# Patient Record
Sex: Female | Born: 1940 | Race: White | Hispanic: No | Smoking: Former smoker
Health system: Southern US, Community
[De-identification: ages and names within clinical notes are randomized; demographics above are authoritative.]

## PROBLEM LIST (undated history)

## (undated) DIAGNOSIS — R059 Cough, unspecified: Secondary | ICD-10-CM

## (undated) DIAGNOSIS — K219 Gastro-esophageal reflux disease without esophagitis: Secondary | ICD-10-CM

## (undated) DIAGNOSIS — N390 Urinary tract infection, site not specified: Secondary | ICD-10-CM

## (undated) DIAGNOSIS — Z0000 Encounter for general adult medical examination without abnormal findings: Secondary | ICD-10-CM

## (undated) DIAGNOSIS — T7840XA Allergy, unspecified, initial encounter: Secondary | ICD-10-CM

## (undated) DIAGNOSIS — I10 Essential (primary) hypertension: Secondary | ICD-10-CM

## (undated) DIAGNOSIS — Z9889 Other specified postprocedural states: Secondary | ICD-10-CM

## (undated) DIAGNOSIS — I872 Venous insufficiency (chronic) (peripheral): Secondary | ICD-10-CM

## (undated) DIAGNOSIS — R112 Nausea with vomiting, unspecified: Secondary | ICD-10-CM

## (undated) DIAGNOSIS — M1990 Unspecified osteoarthritis, unspecified site: Secondary | ICD-10-CM

## (undated) DIAGNOSIS — R739 Hyperglycemia, unspecified: Secondary | ICD-10-CM

## (undated) DIAGNOSIS — R32 Unspecified urinary incontinence: Secondary | ICD-10-CM

## (undated) DIAGNOSIS — J309 Allergic rhinitis, unspecified: Secondary | ICD-10-CM

## (undated) DIAGNOSIS — Z8619 Personal history of other infectious and parasitic diseases: Secondary | ICD-10-CM

## (undated) DIAGNOSIS — F341 Dysthymic disorder: Secondary | ICD-10-CM

## (undated) DIAGNOSIS — K5730 Diverticulosis of large intestine without perforation or abscess without bleeding: Secondary | ICD-10-CM

## (undated) DIAGNOSIS — G629 Polyneuropathy, unspecified: Secondary | ICD-10-CM

## (undated) DIAGNOSIS — G4730 Sleep apnea, unspecified: Secondary | ICD-10-CM

## (undated) DIAGNOSIS — M545 Low back pain, unspecified: Secondary | ICD-10-CM

## (undated) DIAGNOSIS — M79609 Pain in unspecified limb: Secondary | ICD-10-CM

## (undated) DIAGNOSIS — R05 Cough: Secondary | ICD-10-CM

## (undated) DIAGNOSIS — R7309 Other abnormal glucose: Secondary | ICD-10-CM

## (undated) DIAGNOSIS — F418 Other specified anxiety disorders: Secondary | ICD-10-CM

## (undated) DIAGNOSIS — J439 Emphysema, unspecified: Secondary | ICD-10-CM

## (undated) DIAGNOSIS — D649 Anemia, unspecified: Secondary | ICD-10-CM

## (undated) DIAGNOSIS — T7800XA Anaphylactic reaction due to unspecified food, initial encounter: Secondary | ICD-10-CM

## (undated) DIAGNOSIS — J849 Interstitial pulmonary disease, unspecified: Secondary | ICD-10-CM

## (undated) DIAGNOSIS — E669 Obesity, unspecified: Secondary | ICD-10-CM

## (undated) DIAGNOSIS — N39498 Other specified urinary incontinence: Secondary | ICD-10-CM

## (undated) DIAGNOSIS — R413 Other amnesia: Secondary | ICD-10-CM

## (undated) DIAGNOSIS — R350 Frequency of micturition: Secondary | ICD-10-CM

## (undated) DIAGNOSIS — E039 Hypothyroidism, unspecified: Secondary | ICD-10-CM

## (undated) DIAGNOSIS — E559 Vitamin D deficiency, unspecified: Secondary | ICD-10-CM

## (undated) DIAGNOSIS — J449 Chronic obstructive pulmonary disease, unspecified: Secondary | ICD-10-CM

## (undated) DIAGNOSIS — J40 Bronchitis, not specified as acute or chronic: Secondary | ICD-10-CM

## (undated) DIAGNOSIS — N952 Postmenopausal atrophic vaginitis: Secondary | ICD-10-CM

## (undated) DIAGNOSIS — G4733 Obstructive sleep apnea (adult) (pediatric): Secondary | ICD-10-CM

## (undated) DIAGNOSIS — J42 Unspecified chronic bronchitis: Secondary | ICD-10-CM

## (undated) HISTORY — DX: Allergy, unspecified, initial encounter: T78.40XA

## (undated) HISTORY — PX: ABDOMINAL HYSTERECTOMY: SHX81

## (undated) HISTORY — DX: Unspecified osteoarthritis, unspecified site: M19.90

## (undated) HISTORY — DX: Other abnormal glucose: R73.09

## (undated) HISTORY — DX: Sleep apnea, unspecified: G47.30

## (undated) HISTORY — DX: Encounter for general adult medical examination without abnormal findings: Z00.00

## (undated) HISTORY — DX: Other amnesia: R41.3

## (undated) HISTORY — DX: Hypothyroidism, unspecified: E03.9

## (undated) HISTORY — DX: Essential (primary) hypertension: I10

## (undated) HISTORY — DX: Other specified anxiety disorders: F41.8

## (undated) HISTORY — DX: Obesity, unspecified: E66.9

## (undated) HISTORY — PX: CHOLECYSTECTOMY: SHX55

## (undated) HISTORY — DX: Low back pain: M54.5

## (undated) HISTORY — DX: Diverticulosis of large intestine without perforation or abscess without bleeding: K57.30

## (undated) HISTORY — PX: TUBAL LIGATION: SHX77

## (undated) HISTORY — DX: Other specified urinary incontinence: N39.498

## (undated) HISTORY — DX: Gastro-esophageal reflux disease without esophagitis: K21.9

## (undated) HISTORY — PX: APPENDECTOMY: SHX54

## (undated) HISTORY — DX: Chronic obstructive pulmonary disease, unspecified: J44.9

## (undated) HISTORY — DX: Personal history of other infectious and parasitic diseases: Z86.19

## (undated) HISTORY — DX: Unspecified chronic bronchitis: J42

## (undated) HISTORY — DX: Interstitial pulmonary disease, unspecified: J84.9

## (undated) HISTORY — DX: Postmenopausal atrophic vaginitis: N95.2

## (undated) HISTORY — DX: Dysthymic disorder: F34.1

## (undated) HISTORY — DX: Obstructive sleep apnea (adult) (pediatric): G47.33

## (undated) HISTORY — PX: OTHER SURGICAL HISTORY: SHX169

## (undated) HISTORY — DX: Anaphylactic reaction due to unspecified food, initial encounter: T78.00XA

## (undated) HISTORY — PX: SHOULDER ARTHROSCOPY DISTAL CLAVICLE EXCISION AND OPEN ROTATOR CUFF REPAIR: SHX2396

## (undated) HISTORY — DX: Vitamin D deficiency, unspecified: E55.9

## (undated) HISTORY — PX: EYE SURGERY: SHX253

## (undated) HISTORY — DX: Low back pain, unspecified: M54.50

## (undated) HISTORY — DX: Anemia, unspecified: D64.9

## (undated) HISTORY — PX: TONSILLECTOMY AND ADENOIDECTOMY: SHX28

## (undated) HISTORY — DX: Pain in unspecified limb: M79.609

## (undated) HISTORY — DX: Emphysema, unspecified: J43.9

## (undated) HISTORY — DX: Hyperglycemia, unspecified: R73.9

## (undated) HISTORY — DX: Polyneuropathy, unspecified: G62.9

## (undated) HISTORY — DX: Allergic rhinitis, unspecified: J30.9

## (undated) HISTORY — DX: Venous insufficiency (chronic) (peripheral): I87.2

## (undated) HISTORY — DX: Bronchitis, not specified as acute or chronic: J40

## (undated) HISTORY — PX: NECK SURGERY: SHX720

## (undated) HISTORY — PX: SPINE SURGERY: SHX786

## (undated) HISTORY — DX: Urinary tract infection, site not specified: N39.0

---

## 1998-04-16 ENCOUNTER — Encounter: Admission: RE | Admit: 1998-04-16 | Discharge: 1998-07-15 | Payer: Self-pay | Admitting: *Deleted

## 1998-08-12 ENCOUNTER — Ambulatory Visit (HOSPITAL_COMMUNITY): Admission: RE | Admit: 1998-08-12 | Discharge: 1998-08-12 | Payer: Self-pay | Admitting: Specialist

## 1998-08-13 ENCOUNTER — Encounter: Payer: Self-pay | Admitting: Specialist

## 1998-09-02 ENCOUNTER — Observation Stay (HOSPITAL_COMMUNITY): Admission: RE | Admit: 1998-09-02 | Discharge: 1998-09-03 | Payer: Self-pay | Admitting: Specialist

## 1998-10-21 ENCOUNTER — Encounter: Admission: RE | Admit: 1998-10-21 | Discharge: 1998-12-06 | Payer: Self-pay | Admitting: Specialist

## 1999-06-09 ENCOUNTER — Encounter: Payer: Self-pay | Admitting: Emergency Medicine

## 1999-06-09 ENCOUNTER — Emergency Department (HOSPITAL_COMMUNITY): Admission: EM | Admit: 1999-06-09 | Discharge: 1999-06-09 | Payer: Self-pay | Admitting: Emergency Medicine

## 2003-05-13 HISTORY — PX: OTHER SURGICAL HISTORY: SHX169

## 2003-06-06 ENCOUNTER — Encounter: Payer: Self-pay | Admitting: Urology

## 2003-06-11 ENCOUNTER — Observation Stay (HOSPITAL_COMMUNITY): Admission: RE | Admit: 2003-06-11 | Discharge: 2003-06-12 | Payer: Self-pay | Admitting: Urology

## 2004-01-18 ENCOUNTER — Ambulatory Visit (HOSPITAL_COMMUNITY): Admission: RE | Admit: 2004-01-18 | Discharge: 2004-01-18 | Payer: Self-pay | Admitting: Pulmonary Disease

## 2004-11-21 ENCOUNTER — Encounter: Admission: RE | Admit: 2004-11-21 | Discharge: 2004-11-21 | Payer: Self-pay | Admitting: Orthopedic Surgery

## 2004-12-03 ENCOUNTER — Encounter: Admission: RE | Admit: 2004-12-03 | Discharge: 2004-12-03 | Payer: Self-pay | Admitting: Orthopedic Surgery

## 2004-12-10 DIAGNOSIS — R413 Other amnesia: Secondary | ICD-10-CM

## 2004-12-10 HISTORY — DX: Other amnesia: R41.3

## 2004-12-17 ENCOUNTER — Encounter: Admission: RE | Admit: 2004-12-17 | Discharge: 2004-12-17 | Payer: Self-pay | Admitting: Orthopedic Surgery

## 2005-02-25 ENCOUNTER — Ambulatory Visit: Payer: Self-pay | Admitting: Pulmonary Disease

## 2005-03-12 ENCOUNTER — Ambulatory Visit: Payer: Self-pay | Admitting: Pulmonary Disease

## 2005-04-13 ENCOUNTER — Emergency Department (HOSPITAL_COMMUNITY): Admission: EM | Admit: 2005-04-13 | Discharge: 2005-04-13 | Payer: Self-pay | Admitting: Emergency Medicine

## 2005-04-22 ENCOUNTER — Ambulatory Visit: Payer: Self-pay | Admitting: Pulmonary Disease

## 2005-07-02 ENCOUNTER — Other Ambulatory Visit: Admission: RE | Admit: 2005-07-02 | Discharge: 2005-07-02 | Payer: Self-pay | Admitting: Obstetrics and Gynecology

## 2005-07-15 ENCOUNTER — Ambulatory Visit (HOSPITAL_COMMUNITY): Admission: RE | Admit: 2005-07-15 | Discharge: 2005-07-15 | Payer: Self-pay | Admitting: Gastroenterology

## 2005-10-29 ENCOUNTER — Encounter: Admission: RE | Admit: 2005-10-29 | Discharge: 2005-10-29 | Payer: Self-pay | Admitting: Orthopedic Surgery

## 2005-11-30 ENCOUNTER — Inpatient Hospital Stay (HOSPITAL_COMMUNITY): Admission: EM | Admit: 2005-11-30 | Discharge: 2005-12-03 | Payer: Self-pay | Admitting: Emergency Medicine

## 2005-11-30 ENCOUNTER — Ambulatory Visit: Payer: Self-pay | Admitting: Pulmonary Disease

## 2006-06-21 ENCOUNTER — Ambulatory Visit: Payer: Self-pay | Admitting: Pulmonary Disease

## 2006-06-25 ENCOUNTER — Encounter: Admission: RE | Admit: 2006-06-25 | Discharge: 2006-06-25 | Payer: Self-pay | Admitting: Orthopedic Surgery

## 2006-08-18 ENCOUNTER — Encounter: Admission: RE | Admit: 2006-08-18 | Discharge: 2006-08-18 | Payer: Self-pay | Admitting: Orthopedic Surgery

## 2006-09-10 ENCOUNTER — Encounter: Admission: RE | Admit: 2006-09-10 | Discharge: 2006-09-10 | Payer: Self-pay | Admitting: Orthopedic Surgery

## 2006-10-12 HISTORY — PX: OTHER SURGICAL HISTORY: SHX169

## 2006-12-20 ENCOUNTER — Ambulatory Visit: Payer: Self-pay | Admitting: Pulmonary Disease

## 2006-12-20 LAB — CONVERTED CEMR LAB
ALT: 22 units/L (ref 0–40)
AST: 18 units/L (ref 0–37)
Albumin: 3.4 g/dL — ABNORMAL LOW (ref 3.5–5.2)
Alkaline Phosphatase: 82 units/L (ref 39–117)
BUN: 10 mg/dL (ref 6–23)
Basophils Absolute: 0.1 10*3/uL (ref 0.0–0.1)
Basophils Relative: 0.8 % (ref 0.0–1.0)
Bilirubin, Direct: 0.2 mg/dL (ref 0.0–0.3)
CO2: 35 meq/L — ABNORMAL HIGH (ref 19–32)
Calcium: 9.1 mg/dL (ref 8.4–10.5)
Chloride: 104 meq/L (ref 96–112)
Cholesterol: 166 mg/dL (ref 0–200)
Creatinine, Ser: 1 mg/dL (ref 0.4–1.2)
Eosinophils Absolute: 0.2 10*3/uL (ref 0.0–0.6)
Eosinophils Relative: 1.9 % (ref 0.0–5.0)
GFR calc Af Amer: 71 mL/min
GFR calc non Af Amer: 59 mL/min
Glucose, Bld: 104 mg/dL — ABNORMAL HIGH (ref 70–99)
HCT: 45.1 % (ref 36.0–46.0)
HDL: 53.7 mg/dL (ref >39.0)
Hemoglobin: 15.1 g/dL — ABNORMAL HIGH (ref 12.0–15.0)
LDL Cholesterol: 81 mg/dL (ref 0–99)
Lymphocytes Relative: 20.3 % (ref 12.0–46.0)
MCHC: 33.6 g/dL (ref 30.0–36.0)
MCV: 92.9 fL (ref 78.0–100.0)
Monocytes Absolute: 1 10*3/uL — ABNORMAL HIGH (ref 0.2–0.7)
Monocytes Relative: 9.4 % (ref 3.0–11.0)
Neutro Abs: 7.2 10*3/uL (ref 1.4–7.7)
Neutrophils Relative %: 67.6 % (ref 43.0–77.0)
Platelets: 290 10*3/uL (ref 150–400)
Potassium: 4.8 meq/L (ref 3.5–5.1)
RBC: 4.85 M/uL (ref 3.87–5.11)
RDW: 13 % (ref 11.5–14.6)
Sodium: 145 meq/L (ref 135–145)
TSH: 49.55 microintl units/mL — ABNORMAL HIGH (ref 0.35–5.50)
Total Bilirubin: 0.6 mg/dL (ref 0.3–1.2)
Total CHOL/HDL Ratio: 3.1
Total Protein: 6.6 g/dL (ref 6.0–8.3)
Triglycerides: 156 mg/dL — ABNORMAL HIGH (ref 0–149)
VLDL: 31 mg/dL (ref 0–40)
WBC: 10.7 10*3/uL — ABNORMAL HIGH (ref 4.5–10.5)

## 2007-01-13 ENCOUNTER — Ambulatory Visit (HOSPITAL_BASED_OUTPATIENT_CLINIC_OR_DEPARTMENT_OTHER): Admission: RE | Admit: 2007-01-13 | Discharge: 2007-01-13 | Payer: Self-pay | Admitting: Pulmonary Disease

## 2007-01-21 ENCOUNTER — Ambulatory Visit (HOSPITAL_COMMUNITY): Admission: RE | Admit: 2007-01-21 | Discharge: 2007-01-21 | Payer: Self-pay | Admitting: Obstetrics and Gynecology

## 2007-01-21 ENCOUNTER — Ambulatory Visit: Payer: Self-pay | Admitting: Vascular Surgery

## 2007-01-21 ENCOUNTER — Encounter (INDEPENDENT_AMBULATORY_CARE_PROVIDER_SITE_OTHER): Payer: Self-pay | Admitting: *Deleted

## 2007-01-25 ENCOUNTER — Ambulatory Visit: Payer: Self-pay | Admitting: Pulmonary Disease

## 2007-01-25 LAB — CONVERTED CEMR LAB
BUN: 12 mg/dL (ref 6–23)
CO2: 32 meq/L (ref 19–32)
Calcium: 8.9 mg/dL (ref 8.4–10.5)
Chloride: 106 meq/L (ref 96–112)
Creatinine, Ser: 0.8 mg/dL (ref 0.4–1.2)
GFR calc Af Amer: 92 mL/min
GFR calc non Af Amer: 76 mL/min
Glucose, Bld: 159 mg/dL — ABNORMAL HIGH (ref 70–99)
Potassium: 3.5 meq/L (ref 3.5–5.1)
Pro B Natriuretic peptide (BNP): 61 pg/mL (ref 0.0–100.0)
Sodium: 144 meq/L (ref 135–145)
TSH: 0.79 u[IU]/mL (ref 0.35–5.50)

## 2007-01-28 ENCOUNTER — Ambulatory Visit: Payer: Self-pay | Admitting: Pulmonary Disease

## 2007-02-01 ENCOUNTER — Ambulatory Visit: Payer: Self-pay | Admitting: Pulmonary Disease

## 2007-03-04 ENCOUNTER — Ambulatory Visit: Payer: Self-pay | Admitting: Pulmonary Disease

## 2007-03-28 ENCOUNTER — Ambulatory Visit: Payer: Self-pay | Admitting: Cardiology

## 2007-04-13 ENCOUNTER — Encounter: Payer: Self-pay | Admitting: Cardiology

## 2007-04-13 ENCOUNTER — Ambulatory Visit: Payer: Self-pay

## 2007-05-10 ENCOUNTER — Ambulatory Visit: Payer: Self-pay | Admitting: Pulmonary Disease

## 2007-08-23 ENCOUNTER — Ambulatory Visit: Payer: Self-pay | Admitting: Pulmonary Disease

## 2007-08-23 DIAGNOSIS — J309 Allergic rhinitis, unspecified: Secondary | ICD-10-CM | POA: Insufficient documentation

## 2007-08-23 DIAGNOSIS — M545 Low back pain, unspecified: Secondary | ICD-10-CM | POA: Insufficient documentation

## 2007-08-23 DIAGNOSIS — G4733 Obstructive sleep apnea (adult) (pediatric): Secondary | ICD-10-CM

## 2007-08-23 DIAGNOSIS — I872 Venous insufficiency (chronic) (peripheral): Secondary | ICD-10-CM

## 2007-08-23 DIAGNOSIS — E669 Obesity, unspecified: Secondary | ICD-10-CM

## 2007-08-23 DIAGNOSIS — K219 Gastro-esophageal reflux disease without esophagitis: Secondary | ICD-10-CM

## 2007-08-23 DIAGNOSIS — E039 Hypothyroidism, unspecified: Secondary | ICD-10-CM | POA: Insufficient documentation

## 2007-08-23 HISTORY — DX: Hypothyroidism, unspecified: E03.9

## 2007-08-23 HISTORY — DX: Venous insufficiency (chronic) (peripheral): I87.2

## 2007-08-23 HISTORY — DX: Allergic rhinitis, unspecified: J30.9

## 2007-08-23 HISTORY — DX: Low back pain, unspecified: M54.50

## 2007-08-23 HISTORY — DX: Obesity, unspecified: E66.9

## 2007-08-23 HISTORY — DX: Obstructive sleep apnea (adult) (pediatric): G47.33

## 2007-08-23 HISTORY — DX: Gastro-esophageal reflux disease without esophagitis: K21.9

## 2008-02-14 ENCOUNTER — Encounter: Payer: Self-pay | Admitting: Pulmonary Disease

## 2008-02-18 ENCOUNTER — Encounter: Payer: Self-pay | Admitting: Pulmonary Disease

## 2008-02-22 ENCOUNTER — Ambulatory Visit: Payer: Self-pay | Admitting: Pulmonary Disease

## 2008-02-22 DIAGNOSIS — M1712 Unilateral primary osteoarthritis, left knee: Secondary | ICD-10-CM | POA: Insufficient documentation

## 2008-02-22 DIAGNOSIS — J42 Unspecified chronic bronchitis: Secondary | ICD-10-CM

## 2008-02-22 DIAGNOSIS — I10 Essential (primary) hypertension: Secondary | ICD-10-CM | POA: Insufficient documentation

## 2008-02-22 DIAGNOSIS — K5730 Diverticulosis of large intestine without perforation or abscess without bleeding: Secondary | ICD-10-CM

## 2008-02-22 DIAGNOSIS — M1990 Unspecified osteoarthritis, unspecified site: Secondary | ICD-10-CM

## 2008-02-22 DIAGNOSIS — R079 Chest pain, unspecified: Secondary | ICD-10-CM | POA: Insufficient documentation

## 2008-02-22 HISTORY — DX: Unspecified osteoarthritis, unspecified site: M19.90

## 2008-02-22 HISTORY — DX: Essential (primary) hypertension: I10

## 2008-02-22 HISTORY — DX: Diverticulosis of large intestine without perforation or abscess without bleeding: K57.30

## 2008-02-22 HISTORY — DX: Unspecified chronic bronchitis: J42

## 2008-02-26 LAB — CONVERTED CEMR LAB
ALT: 14 U/L (ref 0–35)
AST: 17 U/L (ref 0–37)
Albumin: 3.8 g/dL (ref 3.5–5.2)
Alkaline Phosphatase: 114 units/L (ref 39–117)
BUN: 17 mg/dL (ref 6–23)
Basophils Absolute: 0 10*3/uL (ref 0.0–0.1)
Basophils Relative: 0.1 % (ref 0.0–1.0)
Bilirubin, Direct: 0.2 mg/dL (ref 0.0–0.3)
CO2: 32 meq/L (ref 19–32)
Calcium: 9.3 mg/dL (ref 8.4–10.5)
Chloride: 104 meq/L (ref 96–112)
Cholesterol: 137 mg/dL (ref 0–200)
Creatinine, Ser: 0.9 mg/dL (ref 0.4–1.2)
Eosinophils Absolute: 0.4 10*3/uL (ref 0.0–0.7)
Eosinophils Relative: 4.3 % (ref 0.0–5.0)
GFR calc Af Amer: 80 mL/min
GFR calc non Af Amer: 66 mL/min
Glucose, Bld: 129 mg/dL — ABNORMAL HIGH (ref 70–99)
HCT: 43.8 % (ref 36.0–46.0)
HDL: 39.3 mg/dL (ref >39.0)
Hemoglobin: 14.7 g/dL (ref 12.0–15.0)
Hgb A1c MFr Bld: 6.4 % — ABNORMAL HIGH (ref 4.6–6.0)
LDL Cholesterol: 78 mg/dL (ref 0–99)
Lymphocytes Relative: 23.8 % (ref 12.0–46.0)
MCHC: 33.6 g/dL (ref 30.0–36.0)
MCV: 90.2 fL (ref 78.0–100.0)
Monocytes Absolute: 0.8 10*3/uL (ref 0.1–1.0)
Monocytes Relative: 7.6 % (ref 3.0–12.0)
Neutro Abs: 6.3 10*3/uL (ref 1.4–7.7)
Neutrophils Relative %: 64.2 % (ref 43.0–77.0)
Platelets: 311 10*3/uL (ref 150–400)
Potassium: 4.9 meq/L (ref 3.5–5.1)
RBC: 4.86 M/uL (ref 3.87–5.11)
RDW: 12.1 % (ref 11.5–14.6)
Sodium: 142 meq/L (ref 135–145)
TSH: 8.17 u[IU]/mL — ABNORMAL HIGH (ref 0.35–5.50)
Total Bilirubin: 0.9 mg/dL (ref 0.3–1.2)
Total CHOL/HDL Ratio: 3.5
Total Protein: 6.7 g/dL (ref 6.0–8.3)
Triglycerides: 101 mg/dL (ref 0–149)
VLDL: 20 mg/dL (ref 0–40)
WBC: 9.9 10*3/uL (ref 4.5–10.5)

## 2008-03-06 DIAGNOSIS — F341 Dysthymic disorder: Secondary | ICD-10-CM

## 2008-03-06 DIAGNOSIS — F418 Other specified anxiety disorders: Secondary | ICD-10-CM | POA: Insufficient documentation

## 2008-03-06 HISTORY — DX: Other specified anxiety disorders: F41.8

## 2008-03-06 HISTORY — DX: Dysthymic disorder: F34.1

## 2008-03-06 LAB — CONVERTED CEMR LAB: Vit D, 1,25-Dihydroxy: 16 — ABNORMAL LOW (ref 30–89)

## 2008-08-21 ENCOUNTER — Ambulatory Visit: Payer: Self-pay | Admitting: Pulmonary Disease

## 2008-08-21 DIAGNOSIS — E669 Obesity, unspecified: Secondary | ICD-10-CM | POA: Insufficient documentation

## 2008-08-21 DIAGNOSIS — E1169 Type 2 diabetes mellitus with other specified complication: Secondary | ICD-10-CM | POA: Insufficient documentation

## 2008-08-21 DIAGNOSIS — E559 Vitamin D deficiency, unspecified: Secondary | ICD-10-CM | POA: Insufficient documentation

## 2008-08-21 DIAGNOSIS — R7309 Other abnormal glucose: Secondary | ICD-10-CM

## 2008-08-21 HISTORY — DX: Other abnormal glucose: R73.09

## 2008-08-21 HISTORY — DX: Vitamin D deficiency, unspecified: E55.9

## 2008-08-26 LAB — CONVERTED CEMR LAB
BUN: 16 mg/dL (ref 6–23)
CO2: 30 meq/L (ref 19–32)
Calcium: 9.3 mg/dL (ref 8.4–10.5)
Chloride: 104 meq/L (ref 96–112)
Creatinine, Ser: 0.9 mg/dL (ref 0.4–1.2)
GFR calc Af Amer: 80 mL/min
GFR calc non Af Amer: 66 mL/min
Glucose, Bld: 118 mg/dL — ABNORMAL HIGH (ref 70–99)
Hgb A1c MFr Bld: 6.8 % — ABNORMAL HIGH (ref 4.6–6.0)
Potassium: 5.1 meq/L (ref 3.5–5.1)
Sodium: 140 meq/L (ref 135–145)
TSH: 18.13 microintl units/mL — ABNORMAL HIGH (ref 0.35–5.50)
Vit D, 1,25-Dihydroxy: 30 (ref 30–89)

## 2008-10-12 HISTORY — PX: OTHER SURGICAL HISTORY: SHX169

## 2009-03-19 ENCOUNTER — Encounter: Payer: Self-pay | Admitting: Adult Health

## 2009-03-19 ENCOUNTER — Ambulatory Visit: Payer: Self-pay | Admitting: Internal Medicine

## 2009-03-21 LAB — CONVERTED CEMR LAB: Vit D, 25-Hydroxy: 20 ng/mL — ABNORMAL LOW (ref 30–89)

## 2009-03-29 LAB — CONVERTED CEMR LAB
BUN: 15 mg/dL (ref 6–23)
Basophils Absolute: 0 10*3/uL (ref 0.0–0.1)
Basophils Relative: 0 % (ref 0.0–3.0)
CO2: 30 meq/L (ref 19–32)
Calcium: 9.3 mg/dL (ref 8.4–10.5)
Chloride: 111 meq/L (ref 96–112)
Cholesterol: 146 mg/dL (ref 0–200)
Creatinine, Ser: 0.8 mg/dL (ref 0.4–1.2)
Eosinophils Absolute: 0.4 10*3/uL (ref 0.0–0.7)
Eosinophils Relative: 5.3 % — ABNORMAL HIGH (ref 0.0–5.0)
GFR calc non Af Amer: 75.3 mL/min (ref >60)
Glucose, Bld: 100 mg/dL — ABNORMAL HIGH (ref 70–99)
HCT: 41.3 % (ref 36.0–46.0)
HDL: 39.1 mg/dL (ref >39.00)
Hemoglobin: 14.4 g/dL (ref 12.0–15.0)
Hgb A1c MFr Bld: 6 % (ref 4.6–6.5)
LDL Cholesterol: 82 mg/dL (ref 0–99)
Lymphocytes Relative: 29.8 % (ref 12.0–46.0)
Lymphs Abs: 2.3 10*3/uL (ref 0.7–4.0)
MCHC: 35 g/dL (ref 30.0–36.0)
MCV: 90.7 fL (ref 78.0–100.0)
Monocytes Absolute: 0.7 10*3/uL (ref 0.1–1.0)
Monocytes Relative: 8.4 % (ref 3.0–12.0)
Neutro Abs: 4.4 10*3/uL (ref 1.4–7.7)
Neutrophils Relative %: 56.5 % (ref 43.0–77.0)
Platelets: 224 10*3/uL (ref 150.0–400.0)
Potassium: 4.1 meq/L (ref 3.5–5.1)
RBC: 4.55 M/uL (ref 3.87–5.11)
RDW: 12 % (ref 11.5–14.6)
Sodium: 144 meq/L (ref 135–145)
TSH: 0.22 microintl units/mL — ABNORMAL LOW (ref 0.35–5.50)
Total CHOL/HDL Ratio: 4
Triglycerides: 125 mg/dL (ref 0.0–149.0)
VLDL: 25 mg/dL (ref 0.0–40.0)
WBC: 7.8 10*3/uL (ref 4.5–10.5)

## 2009-06-26 ENCOUNTER — Telehealth: Payer: Self-pay | Admitting: Pulmonary Disease

## 2010-03-12 ENCOUNTER — Ambulatory Visit: Payer: Self-pay | Admitting: Pulmonary Disease

## 2010-03-12 DIAGNOSIS — N39498 Other specified urinary incontinence: Secondary | ICD-10-CM

## 2010-03-12 HISTORY — DX: Other specified urinary incontinence: N39.498

## 2010-03-13 LAB — CONVERTED CEMR LAB
ALT: 17 units/L (ref 0–35)
AST: 24 U/L (ref 0–37)
Albumin: 4 g/dL (ref 3.5–5.2)
Alkaline Phosphatase: 93 U/L (ref 39–117)
BUN: 16 mg/dL (ref 6–23)
Basophils Absolute: 0 10*3/uL (ref 0.0–0.1)
Basophils Relative: 0.4 % (ref 0.0–3.0)
Bilirubin, Direct: 0.3 mg/dL (ref 0.0–0.3)
CO2: 32 meq/L (ref 19–32)
Calcium: 9.5 mg/dL (ref 8.4–10.5)
Chloride: 106 meq/L (ref 96–112)
Cholesterol: 131 mg/dL (ref 0–200)
Creatinine, Ser: 0.9 mg/dL (ref 0.4–1.2)
Eosinophils Absolute: 0.4 10*3/uL (ref 0.0–0.7)
Eosinophils Relative: 3.9 % (ref 0.0–5.0)
GFR calc non Af Amer: 67.26 mL/min (ref >60)
Glucose, Bld: 90 mg/dL (ref 70–99)
HCT: 41 % (ref 36.0–46.0)
HDL: 37.5 mg/dL — ABNORMAL LOW (ref >39.00)
Hemoglobin: 13.9 g/dL (ref 12.0–15.0)
Hgb A1c MFr Bld: 6.1 % (ref 4.6–6.5)
LDL Cholesterol: 68 mg/dL (ref 0–99)
Lymphocytes Relative: 28 % (ref 12.0–46.0)
Lymphs Abs: 2.9 10*3/uL (ref 0.7–4.0)
MCHC: 33.8 g/dL (ref 30.0–36.0)
MCV: 89.6 fL (ref 78.0–100.0)
Monocytes Absolute: 0.7 10*3/uL (ref 0.1–1.0)
Monocytes Relative: 7.1 % (ref 3.0–12.0)
Neutro Abs: 6.2 10*3/uL (ref 1.4–7.7)
Neutrophils Relative %: 60.6 % (ref 43.0–77.0)
Platelets: 293 10*3/uL (ref 150.0–400.0)
Potassium: 5.4 meq/L — ABNORMAL HIGH (ref 3.5–5.1)
RBC: 4.58 M/uL (ref 3.87–5.11)
RDW: 13.1 % (ref 11.5–14.6)
Sodium: 144 meq/L (ref 135–145)
TSH: 0.15 microintl units/mL — ABNORMAL LOW (ref 0.35–5.50)
Total Bilirubin: 0.7 mg/dL (ref 0.3–1.2)
Total CHOL/HDL Ratio: 3
Total Protein: 6.9 g/dL (ref 6.0–8.3)
Triglycerides: 127 mg/dL (ref 0.0–149.0)
VLDL: 25.4 mg/dL (ref 0.0–40.0)
Vit D, 25-Hydroxy: 30 ng/mL (ref 30–89)
WBC: 10.3 10*3/uL (ref 4.5–10.5)

## 2010-03-28 ENCOUNTER — Telehealth (INDEPENDENT_AMBULATORY_CARE_PROVIDER_SITE_OTHER): Payer: Self-pay | Admitting: *Deleted

## 2010-04-09 ENCOUNTER — Telehealth (INDEPENDENT_AMBULATORY_CARE_PROVIDER_SITE_OTHER): Payer: Self-pay | Admitting: *Deleted

## 2010-04-09 DIAGNOSIS — M79609 Pain in unspecified limb: Secondary | ICD-10-CM

## 2010-04-09 HISTORY — DX: Pain in unspecified limb: M79.609

## 2010-06-19 ENCOUNTER — Encounter: Payer: Self-pay | Admitting: Pulmonary Disease

## 2010-09-24 ENCOUNTER — Encounter: Payer: Self-pay | Admitting: Pulmonary Disease
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
## 2010-11-13 NOTE — Consult Note (Signed)
Summary: office note/[facility]  office note/[facility]   Imported By: Lester Tecumseh 03/07/2008 11:35:56  _____________________________________________________________________  External Attachment:    Type:   Image     Comment:   External Document

## 2010-11-13 NOTE — Assessment & Plan Note (Signed)
Summary: congested/ mbw   Visit Type:  Accute  CC:  Accute Visit c/o prod cough dark brown , denies sob , nasal drainage x 2 weeks, pt would also like labs was unable to keep appt withj Dr. Kriste Basque 2 weeks ago , and patient also c/o fatigue she does have cpap machined but is not currently using it.  History of Present Illness: 70 y/o WF with known history of HTN, DM-bordeline, obesity, OSA, venous insufficiency.   follow up visit... she has mult med problems as noted below... 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 injected right wrist, plans surg first on the left... ibuprofen & DCN100 without help...  March 19, 2009-- 2 wks of Accute Visit c/o prod cough dark brown , denies sob , nasal drainage x 2 weeks, pt would also like labs was unable to keep appt withj Dr. Kriste Basque 2 weeks ago , patient also c/o fatigue she does have cpap machined but is not currently using it. She says she is constantly tossing turning at night, cant sleep, trouble falling asleep, then wakes up frequently, does nap occasionally on bad night. .Denies chest pain, dyspnea, orthopnea, hemoptysis, fever, n/v/d, edema, headache.    Preventive Screening-Counseling & Management  Alcohol-Tobacco     Smoking Status: quit     Packs/Day: 1.0     Year Started: 1958     Year Quit: 1993  Current Medications (verified): 1)  Mucinex 600 Mg  Tb12 (Guaifenesin) .Marland Kitchen.. 1-2 Tabs By Mouth Two Times A Day W/ Plenty of Fluids.Marland KitchenMarland Kitchen 2)  Symbicort 160-4.5 Mcg/act  Aero (Budesonide-Formoterol Fumarate) .... Use One Puff Once Daily 3)  Proair Hfa 108 (90 Base) Mcg/act  Aers (Albuterol Sulfate) .Marland Kitchen.. 1-2 Sprays Q6h As Needed For Wheezing... 4)  Furosemide 40 Mg  Tabs (Furosemide) .... Take 1 Tablet By Mouth As Needed 5)  Klor-Con M20 20 Meq  Tbcr (Potassium Chloride Crys Cr) .... Take 1 Tablet By Mouth Once A Day 6)  Synthroid 150 Mcg  Tabs (Levothyroxine Sodium) .... Take 1 Tablet By Mouth Once A Day 7)  Pepcid Ac 10  Mg  Tabs (Famotidine) .... Take 1 Tab By Mouth Two Times A Day As Needed For Acid... 8)  Alprazolam 0.5 Mg  Tabs (Alprazolam) .... As Needed Up To Three Times A Day 9)  Lyrica 50 Mg Caps (Pregabalin) .Marland Kitchen.. 1 Capsule Every Other Night  Allergies (verified): 1)  ! Codeine  Past History:  Past Surgical History: Last updated: 08/21/2008 S/P T & A S/P appendectomy S/P hysterectomy S/P AP repair and sling for cystocele 8/04 by DrTannenbaum  Family History: Last updated: 03/19/2009 Mother COPD  Social History: Last updated: 03/19/2009 Retired Widowed with 3 children  Risk Factors: Smoking Status: quit (03/19/2009) Packs/Day: 1.0 (03/19/2009)  Past Medical History: ALLERGIC RHINITIS (ICD-477.9) - on antihistamine OTC and FLONASE as needed... eval and Rx per DrESL...  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 she is currently taking SYMBICORT 80 Prn only and MUCINEX per DrESL, along w/ PROAIR prn... she is off her prev Advair and Singulair... in addition she has Medrol 4mg  tabs for as needed use- but it is not clear to me when and for what she uses it...  SLEEP APNEA, OBSTRUCTIVE (ICD-327.23) - abnormal sleep study 4/08 showing an AHI of 29, and desat to 62%... started on CPAP and seen by DrClance... optimized to 12cm pressure...  HYPERTENSION (ICD-401.9) - on LASIX 40mg /d & K20/d.Marland Kitchen   Hx of CHEST  PAIN (ICD-786.50) - on ASA 81mg /d... s/p cardiac eval 6/08 by DrHochrein... she had a neg 2D & Cardiolite.  ~  2DEcho 7/08 showed trivial AI, no wall motion abn, norm LVF w/ EF= 55-60%...  ~  NuclearStressTest 7/08 showed no ischemia or infarction, EF= 79%...  VENOUS INSUFFICIENCY (ICD-459.81) - she follows a low sodium diet, etc...  DIABETES MELLITUS, BORDERLINE (ICD-790.29) - FBS= 129-159 this year... discussed diet + exercise and the need to get her weight down...  OBESITY (ICD-278.00) - weight = 232# down 5# from 6 months ago... we have reviewed diet + exercise  requirements to get her weight down...      Family History: Mother COPD  Social History: Retired Widowed with 3 childrenPacks/Day:  1.0  Review of Systems      See HPI  Vital Signs:  Patient profile:   70 year old female Height:      66 inches Weight:      227 pounds BMI:     36.77 O2 Sat:      97 % on Room air Temp:     97.2 degrees F oral BP sitting:   130 / 88  (left arm)  Vitals Entered By: Renold Genta RCP, LPN (March 19, 8118 9:50 AM)  O2 Flow:  Room air CC: Accute Visit c/o prod cough dark brown , denies sob , nasal drainage x 2 weeks, pt would also like labs was unable to keep appt withj Dr. Kriste Basque 2 weeks ago , patient also c/o fatigue she does have cpap machined but is not currently using it Is Patient Diabetic? No Comments Medications reviewed with patient Renold Genta RCP, LPN  March 19, 1477 9:57 AM    Physical Exam  Additional Exam:  WD, Obese, 70 y/o WF in NAD... GENERAL:  Alert & oriented; pleasant & cooperative... HEENT:  [place]/AT,EACs-clear, TMs-wnl, NOSE-clear, THROAT-clear & wnl. NECK:  Supple w/ fairROM; no JVD; normal carotid impulses w/o bruits; no thyromegaly or nodules palpated; no lymphadenopathy. CHEST:  Clear to P & A; without wheezes/ rales/ or rhonchi. HEART:  Regular Rhythm; without murmurs/ rubs/ or gallops. ABDOMEN:  Obese, soft & nontender; normal bowel sounds; no organomegaly or masses detected. EXT: without deformities, mild arthritic changes; no varicose veins/ +venous insuffic/ tr edema. NEURO:  CN's intact;  no focal neuro deficits... DERM:  No lesions noted; no rash etc...     Impression & Recommendations:  Problem # 1:  HYPERTENSION (ICD-401.9) controlled on rx.  Her updated medication list for this problem includes:    Furosemide 40 Mg Tabs (Furosemide) .Marland Kitchen... Take 1 tablet by mouth as needed  Orders: TLB-CBC Platelet - w/Differential (85025-CBCD) Est. Patient Level IV (99214)  BP today: 130/88 Prior BP: 140/80  (08/21/2008)  Labs Reviewed: K+: 5.1 (08/21/2008) Creat: : 0.9 (08/21/2008)   Chol: 137 (02/22/2008)   HDL: 39.3 (02/22/2008)   LDL: 78 (02/22/2008)   TG: 101 (02/22/2008)  Problem # 2:  SLEEP APNEA, OBSTRUCTIVE (ICD-327.23)  advised on healthy sleep hygiene will try ambien to help w/ sleep need follow up Dr. Shelle Iron for OSA rec CPAP at bedtime   Orders: Est. 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 Mucinex DM two times a day as needed cough/congestion Please contact office for sooner follow up if symptoms do not improve or worsen  follow up Dr. Kriste Basque in 3 months.   Orders: Est. Patient Level IV (13086)  Problem # 4:  HYPOTHYROIDISM (ICD-244.9) labs pending.  Her updated medication list for this problem includes:    Synthroid 150 Mcg Tabs (Levothyroxine sodium) .Marland Kitchen... Take 1 tablet by mouth once a day  Orders: TLB-TSH (Thyroid Stimulating Hormone) (84443-TSH) TLB-A1C / Hgb A1C (Glycohemoglobin) (83036-A1C)  Medications Added to Medication List This Visit: 1)  Furosemide 40 Mg Tabs (Furosemide) .... Take 1 tablet by mouth as needed 2)  Lyrica 50 Mg Caps (Pregabalin) .Marland Kitchen.. 1 capsule every other night 3)  Zithromax 500 Mg Tabs (Azithromycin) .Marland Kitchen.. 1 by mouth once daily for 3 days 4)  Ambien 10 Mg Tabs (Zolpidem tartrate) .Marland Kitchen.. 1 by mouth at bedtime as needed trouble sleeping.  Complete Medication List: 1)  Mucinex 600 Mg Tb12 (Guaifenesin) .Marland Kitchen.. 1-2 tabs by mouth two times a day w/ plenty of fluids.Marland KitchenMarland Kitchen 2)  Symbicort 160-4.5 Mcg/act Aero (Budesonide-formoterol fumarate) .... Use one puff once daily 3)  Proair Hfa 108 (90 Base) Mcg/act Aers (Albuterol sulfate) .Marland Kitchen.. 1-2 sprays q6h as needed for wheezing... 4)  Furosemide 40 Mg Tabs (Furosemide) .... Take 1 tablet by mouth as needed 5)  Klor-con M20 20 Meq Tbcr (Potassium chloride crys cr) .... Take 1 tablet by mouth once a day 6)  Synthroid 150 Mcg Tabs  (Levothyroxine sodium) .... Take 1 tablet by mouth once a day 7)  Pepcid Ac 10 Mg Tabs (Famotidine) .... Take 1 tab by mouth two times a day as needed for acid.Marland KitchenMarland Kitchen 8)  Alprazolam 0.5 Mg Tabs (Alprazolam) .... As needed up to three times a day 9)  Lyrica 50 Mg Caps (Pregabalin) .Marland Kitchen.. 1 capsule every other night 10)  Zithromax 500 Mg Tabs (Azithromycin) .Marland Kitchen.. 1 by mouth once daily for 3 days 11)  Ambien 10 Mg Tabs (Zolpidem tartrate) .Marland Kitchen.. 1 by mouth at bedtime as needed trouble sleeping.  Other Orders: TLB-BMP (Basic Metabolic Panel-BMET) (80048-METABOL) TLB-Lipid Panel (80061-LIPID) T-Vitamin D (25-Hydroxy) (91478-29562)  Patient Instructions: 1)  Ambien 10mg  at bedtime as needed trouble sleeping,  2)  Do not take xanax.  3)  You must wear your CPAP everynight.  4)  follow up Dr. Shelle Iron for sleep.  5)  No naps during night.  6)  Low fat, sweet diet.  7)  I will call with labs results.  8)  Zithromax 500mg  once daily for 3 days. 9)  Mucinex DM two times a day as needed cough/congestion 10)  Please contact office for sooner follow up if symptoms do not improve or worsen  11)  follow up Dr. Kriste Basque in 3 months.  Prescriptions: PROAIR HFA 108 (90 BASE) MCG/ACT  AERS (ALBUTEROL SULFATE) 1-2 sprays Q6H as needed for wheezing...  #1 x 3   Entered and Authorized by:   Rubye Oaks NP   Signed by:   Rubye Oaks NP on 03/19/2009   Method used:   Electronically to        [facility]* (retail)       [address] [place], [place]  [address]       Ph: [phone] or [phone]       Fax: [phone]   RxID:   [id number] AMBIEN 10 MG TABS (ZOLPIDEM TARTRATE) 1 by mouth at bedtime as needed trouble sleeping.  #30 x 0   Entered and Authorized by:   Rubye Oaks NP   Signed by:   Romina Divirgilio NP on 03/19/2009   Method used:   Print then Give to Patient   RxID:  [id number] ZITHROMAX 500 MG TABS (AZITHROMYCIN) 1 by mouth once daily for 3 days  #3 x 0   Entered and  Authorized by:   Rubye Oaks NP   Signed by:   Rubye Oaks NP on 03/19/2009   Method used:   Electronically to        [facility]* (retail)       [address] [place], [place]  [address]       Ph: [phone] or [phone]       Fax: [phone]   RxID:   [id number]

## 2010-11-13 NOTE — Progress Notes (Signed)
Summary: records  Phone Note Call from Patient   Caller: Patient Call For: nadel Reason for Call: Talk to Nurse Summary of Call: Seeing Dr. Veverly Fells [phone] Fax#  -  Copy of last visit and lab work sent to there office. Initial call taken by: Eugene Gavia,  March 28, 2010 2:41 PM  Follow-up for Phone Call        Faxed notes and labs.//Juanita Follow-up by: Darletta Moll,  March 28, 2010 3:12 PM
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
## 2010-11-13 NOTE — Letter (Signed)
Summary: [facility]  [facility]   Imported By: Lester Kanorado 07/01/2010 09:43:53  _____________________________________________________________________  External Attachment:    Type:   Image     Comment:   External Document

## 2010-11-13 NOTE — Letter (Signed)
Summary: Pollyann Savoy MD  Pollyann Savoy MD   Imported By: Lester Salamanca 10/09/2010 09:19:11  _____________________________________________________________________  External Attachment:    Type:   Image     Comment:   External Document
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
## 2010-11-13 NOTE — Progress Notes (Signed)
Summary: appt  Phone Note Call from Patient Call back at [phone]   Caller: Patient Call For: nadel Reason for Call: Talk to Nurse Summary of Call: Tried to get appt w/Deveschwar - Can't get in until 08/05/2010 - pain in hands real bad.  Can you possibly get pt in sooner? Initial call taken by: Eugene Gavia,  April 09, 2010 2:35 PM  Follow-up for Phone Call        [facility] can you help try and get this pt in any sooner. Pls advise thanks! Vernie Murders  April 09, 2010 2:39 PM   Additional Follow-up for Phone Call Additional follow up Details #1::        does sn want to refer this pt there for the hand pain i have to have an order Additional Follow-up by: Oneita Jolly,  April 09, 2010 2:45 PM  New Problems: HAND PAIN (ICD-729.5)   Additional Follow-up for Phone Call Additional follow up Details #2::    order has been put in computer for pt to have an earlier appt with Dr. Corliss Skains. Randell Loop [facility]  April 09, 2010 2:54 PM   Additional Follow-up for Phone Call Additional follow up Details #3:: Details for Additional Follow-up Action Taken: pt aware next appt is 08/05/10 she will wait because sn referred her there, i have spoken to the office and this is the next opening  Additional Follow-up by: Oneita Jolly,  April 10, 2010 11:12 AM  New Problems: HAND PAIN (ICD-729.5)

## 2010-11-13 NOTE — Consult Note (Signed)
Summary: [facility]  [facility]   Imported By: Lanelle Bal 02/27/2008 13:17:38  _____________________________________________________________________  External Attachment:    Type:   Image     Comment:   External Document

## 2010-12-05 ENCOUNTER — Encounter: Payer: Self-pay | Admitting: Pulmonary Disease

## 2010-12-23 NOTE — Letter (Signed)
Summary: [facility]  [facility]   Imported By: Sherian Rein 12/16/2010 12:53:21  _____________________________________________________________________  External Attachment:    Type:   Image     Comment:   External Document

## 2011-01-14 ENCOUNTER — Encounter: Payer: Self-pay | Admitting: Family Medicine

## 2011-01-14 ENCOUNTER — Ambulatory Visit (INDEPENDENT_AMBULATORY_CARE_PROVIDER_SITE_OTHER): Payer: Medicare Other | Admitting: Family Medicine

## 2011-01-14 ENCOUNTER — Other Ambulatory Visit (HOSPITAL_COMMUNITY)
Admission: RE | Admit: 2011-01-14 | Discharge: 2011-01-14 | Disposition: A | Discharge status: Home / Self Care | Payer: Medicare Other | Source: Ambulatory Visit | Attending: Family Medicine | Admitting: Family Medicine

## 2011-01-14 VITALS — BP 131/84 | HR 73 | Temp 97.7°F | Ht 66.0 in | Wt 221.8 lb

## 2011-01-14 DIAGNOSIS — Z8619 Personal history of other infectious and parasitic diseases: Secondary | ICD-10-CM

## 2011-01-14 DIAGNOSIS — K219 Gastro-esophageal reflux disease without esophagitis: Secondary | ICD-10-CM

## 2011-01-14 DIAGNOSIS — F341 Dysthymic disorder: Secondary | ICD-10-CM

## 2011-01-14 DIAGNOSIS — J8283 Eosinophilic asthma: Secondary | ICD-10-CM | POA: Insufficient documentation

## 2011-01-14 DIAGNOSIS — T7800XA Anaphylactic reaction due to unspecified food, initial encounter: Secondary | ICD-10-CM

## 2011-01-14 DIAGNOSIS — R7309 Other abnormal glucose: Secondary | ICD-10-CM

## 2011-01-14 DIAGNOSIS — E559 Vitamin D deficiency, unspecified: Secondary | ICD-10-CM

## 2011-01-14 DIAGNOSIS — I10 Essential (primary) hypertension: Secondary | ICD-10-CM

## 2011-01-14 DIAGNOSIS — B372 Candidiasis of skin and nail: Secondary | ICD-10-CM

## 2011-01-14 DIAGNOSIS — G4733 Obstructive sleep apnea (adult) (pediatric): Secondary | ICD-10-CM

## 2011-01-14 DIAGNOSIS — E669 Obesity, unspecified: Secondary | ICD-10-CM

## 2011-01-14 DIAGNOSIS — E039 Hypothyroidism, unspecified: Secondary | ICD-10-CM

## 2011-01-14 DIAGNOSIS — E119 Type 2 diabetes mellitus without complications: Secondary | ICD-10-CM

## 2011-01-14 DIAGNOSIS — I872 Venous insufficiency (chronic) (peripheral): Secondary | ICD-10-CM

## 2011-01-14 DIAGNOSIS — N760 Acute vaginitis: Secondary | ICD-10-CM | POA: Insufficient documentation

## 2011-01-14 DIAGNOSIS — Z01419 Encounter for gynecological examination (general) (routine) without abnormal findings: Secondary | ICD-10-CM | POA: Insufficient documentation

## 2011-01-14 DIAGNOSIS — D649 Anemia, unspecified: Secondary | ICD-10-CM

## 2011-01-14 DIAGNOSIS — N39498 Other specified urinary incontinence: Secondary | ICD-10-CM

## 2011-01-14 DIAGNOSIS — M79609 Pain in unspecified limb: Secondary | ICD-10-CM

## 2011-01-14 DIAGNOSIS — J45909 Unspecified asthma, uncomplicated: Secondary | ICD-10-CM | POA: Insufficient documentation

## 2011-01-14 HISTORY — DX: Personal history of other infectious and parasitic diseases: Z86.19

## 2011-01-14 HISTORY — DX: Anaphylactic reaction due to unspecified food, initial encounter: T78.00XA

## 2011-01-14 LAB — CBC WITH DIFFERENTIAL/PLATELET
Basophils Absolute: 0 10*3/uL (ref 0.0–0.1)
Basophils Relative: 0.5 % (ref 0.0–3.0)
Eosinophils Absolute: 0.5 10*3/uL (ref 0.0–0.7)
Eosinophils Relative: 6.2 % — ABNORMAL HIGH (ref 0.0–5.0)
HCT: 41.3 % (ref 36.0–46.0)
Hemoglobin: 13.9 g/dL (ref 12.0–15.0)
Lymphocytes Relative: 30.1 % (ref 12.0–46.0)
Lymphs Abs: 2.3 10*3/uL (ref 0.7–4.0)
MCHC: 33.8 g/dL (ref 30.0–36.0)
MCV: 89.4 fl (ref 78.0–100.0)
Monocytes Absolute: 0.6 10*3/uL (ref 0.1–1.0)
Monocytes Relative: 7.6 % (ref 3.0–12.0)
Neutro Abs: 4.3 10*3/uL (ref 1.4–7.7)
Neutrophils Relative %: 55.6 % (ref 43.0–77.0)
Platelets: 235 10*3/uL (ref 150.0–400.0)
RBC: 4.62 Mil/uL (ref 3.87–5.11)
RDW: 13.7 % (ref 11.5–14.6)
WBC: 7.8 10*3/uL (ref 4.5–10.5)

## 2011-01-14 LAB — RENAL FUNCTION PANEL
Albumin: 3.7 g/dL (ref 3.5–5.2)
BUN: 14 mg/dL (ref 6–23)
CO2: 31 mEq/L (ref 19–32)
Calcium: 9.3 mg/dL (ref 8.4–10.5)
Chloride: 105 mEq/L (ref 96–112)
Creatinine, Ser: 0.9 mg/dL (ref 0.4–1.2)
GFR: 67.99 mL/min (ref >60.00)
Glucose, Bld: 79 mg/dL (ref 70–99)
Phosphorus: 3.2 mg/dL (ref 2.3–4.6)
Potassium: 5.1 meq/L (ref 3.5–5.1)
Sodium: 144 mEq/L (ref 135–145)

## 2011-01-14 LAB — POCT URINALYSIS DIPSTICK
Bilirubin, UA: NEGATIVE
Blood, UA: NEGATIVE
Glucose, UA: NEGATIVE
Ketones, UA: NEGATIVE
Nitrite, UA: NEGATIVE
Protein, UA: 30
Spec Grav, UA: 1.03
Urobilinogen, UA: 1
pH, UA: 6.5

## 2011-01-14 LAB — LIPID PANEL
Cholesterol: 132 mg/dL (ref 0–200)
HDL: 39.7 mg/dL (ref >39.00)
LDL Cholesterol: 76 mg/dL (ref 0–99)
Total CHOL/HDL Ratio: 3
Triglycerides: 84 mg/dL (ref 0.0–149.0)
VLDL: 16.8 mg/dL (ref 0.0–40.0)

## 2011-01-14 LAB — HEPATIC FUNCTION PANEL
ALT: 16 U/L (ref 0–35)
AST: 20 U/L (ref 0–37)
Albumin: 3.7 g/dL (ref 3.5–5.2)
Alkaline Phosphatase: 84 U/L (ref 39–117)
Bilirubin, Direct: 0.2 mg/dL (ref 0.0–0.3)
Total Bilirubin: 0.8 mg/dL (ref 0.3–1.2)
Total Protein: 6.8 g/dL (ref 6.0–8.3)

## 2011-01-14 LAB — HEMOGLOBIN A1C: Hgb A1c MFr Bld: 6 % (ref 4.6–6.5)

## 2011-01-14 LAB — MICROALBUMIN / CREATININE URINE RATIO
Creatinine,U: 260.6 mg/dL
Microalb Creat Ratio: 0.5 mg/g (ref 0.0–30.0)
Microalb, Ur: 1.3 mg/dL (ref 0.0–1.9)

## 2011-01-14 LAB — TSH: TSH: 0.07 u[IU]/mL — ABNORMAL LOW (ref 0.35–5.50)

## 2011-01-14 MED ORDER — EPINEPHRINE 0.3 MG/0.3ML IJ DEVI: 0.3000 mg | Freq: Once | INTRAMUSCULAR | Status: AC

## 2011-01-14 MED ORDER — NYSTATIN 100000 UNIT/GM EX OINT: TOPICAL_OINTMENT | Freq: Two times a day (BID) | CUTANEOUS | Status: DC

## 2011-01-14 MED ORDER — FLUCONAZOLE 150 MG PO TABS: 150.0000 mg | ORAL_TABLET | Freq: Once | ORAL | Status: AC

## 2011-01-14 NOTE — Assessment & Plan Note (Signed)
Patient symptoms are suggestive of yeast, she is given two doses of Diflucan and asked to start a probiotic, pap is sent off for further evaluation

## 2011-01-14 NOTE — Assessment & Plan Note (Signed)
Chronic and b/l despite b/l CTR and a procedure on her left thumb. Patient even notes some neuropathy and weakness in additionto the discomfort. No definitive diagnosis. May need neurologic or another orthopaedic consult in the future

## 2011-01-14 NOTE — Assessment & Plan Note (Signed)
Patient is given a handout to ask her to consider the DASH diet as a way to manage multiple issues including her obesity. Needs small, frequent meals with lean proteins and complex carbs

## 2011-01-14 NOTE — Assessment & Plan Note (Signed)
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

## 2011-01-14 NOTE — Assessment & Plan Note (Signed)
No significant flares since quitting her job in an [employer], may use Albuterol prn

## 2011-01-14 NOTE — Assessment & Plan Note (Signed)
No recent difficulty but does have some trouble with edema and varicosities at times. Encouraged elevate feet prn and minimize sodium

## 2011-01-14 NOTE — Assessment & Plan Note (Signed)
Patient agrees to have level rechecked

## 2011-01-14 NOTE — Assessment & Plan Note (Signed)
Given Nystatin ointment to use tid prn and she will report if symptoms are not improving

## 2011-01-14 NOTE — Assessment & Plan Note (Signed)
On Levothyroxine 150, will recheck TSH and change dosing as indicated

## 2011-01-14 NOTE — Assessment & Plan Note (Signed)
Adequately controlled, no change in medications at this time

## 2011-01-14 NOTE — Patient Instructions (Signed)

## 2011-01-14 NOTE — Assessment & Plan Note (Signed)
Denies full blown depression but acknowledges feeling more low ever since her mother died 3 months ago. Declines medications at this time

## 2011-01-14 NOTE — Assessment & Plan Note (Signed)
Patient notes she has had trouble with urge incontinence for several years now ever since she underwent a bladder tack, She feels the symptoms are manageable as long as she uses the bathroom regularly and does not ignore the urges. She had been asked in the past to self cath but does not wish to do this at this time.

## 2011-01-14 NOTE — Assessment & Plan Note (Signed)
Patient is not checking sugars but agrees to lab work prior to next visit to further evaluate. Encouraged to use complex carbs and lean proteins with small, frequent meals

## 2011-01-14 NOTE — Assessment & Plan Note (Signed)
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 She is encouraged to figure out a way to use her machine

## 2011-01-14 NOTE — Assessment & Plan Note (Signed)
Need sto avoid offending foods and no change in meds at today's visit

## 2011-01-15 ENCOUNTER — Other Ambulatory Visit: Payer: Self-pay

## 2011-01-15 MED ORDER — LEVOTHYROXINE SODIUM 125 MCG PO TABS: 125.0000 ug | ORAL_TABLET | Freq: Every day | ORAL | Status: DC

## 2011-01-17 LAB — URINE CULTURE: Colony Count: >=100000

## 2011-01-17 NOTE — Progress Notes (Signed)
Jenny [name] [id number] 1941/01/20 01/17/2011      Progress Note New Patient  Subjective  Chief Complaint  Chief Complaint  Patient presents with  . Establish Care    new patient  . Vaginitis    yeast infection? X    HPI  Patient is in today for new patient appt. Her major concern today is some vaginal irritation and lower abdominal pressure. She has been having some symptoms off and on for the past couple of weeks. She has used Monistat OTC intermittently for the vaginal itching. This helped the symptoms temporarily only for them to return. She denies any significant abdominal pain but does acknowledge cement of lower abdominal pressure. She does not note any major vaginal discharge or lesions. Denies pain. Denies urinary symptoms such as urgency, frequency, dysuria or hematuria. She has a history of reflux but says that generally well controlled HEENT correctly. She has been struggling with dysthymia for the last several months since her mother passed away but does not believe she is in a full depression and feels she is handling it relatively well. She is not presently using her CPAP do to sitting is an elderly woman and she is afraid she will not hear her. She is struggling with some fatigue. She denies any recent fevers, chills, chest pain, palpitations, shortness of breath, GI complaints. Not she denies any polyuria or polydipsia. Does not check her blood pressure but denies any HA or fatigue  Past Medical History  Diagnosis Date  . Allergy     rhinitis  . Obstructive sleep apnea   . Bronchitis   . Hypertension   . Chest pain   . Venous insufficiency   . Diabetes mellitus     borderline  . Obesity   . Hypothyroidism   . GERD (gastroesophageal reflux disease)   . Diverticulosis of colon   . Urinary incontinence   . Degenerative joint disease   . Lumbar back pain   . Vitamin D deficiency   . Dysthymia   . Asthma     mild, intermittent  . Transient memory loss March  2006    after a stressful event she had no memory the rest of that day  . VITAMIN D DEFICIENCY 08/21/2008  . VENOUS INSUFFICIENCY 08/23/2007  . SLEEP APNEA, OBSTRUCTIVE 08/23/2007  . Other urinary incontinence 03/12/2010  . OBESITY 08/23/2007  . HYPOTHYROIDISM 08/23/2007  . HYPERTENSION 02/22/2008  . HAND PAIN 04/09/2010  . GERD 08/23/2007  . 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  . DIVERTICULOSIS OF COLON 02/22/2008  . DIABETES MELLITUS, BORDERLINE 08/21/2008  . DEGENERATIVE JOINT DISEASE 02/22/2008  . BRONCHITIS, RECURRENT 02/22/2008  . BACK PAIN, LUMBAR 08/23/2007  . ALLERGIC RHINITIS 08/23/2007  . Asthma 01/14/2011  . 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 History of chicken pox 01/14/2011  . History of measles 01/14/2011    Past Surgical History  Procedure Date  . Appendectomy   . Tonsillectomy and adenoidectomy   . Ap repair and sling for cystocele 8-04     Dr Patsi Sears  . Left hand 2010    left thumb, joint removal, CTR  . Ctr     right wrist  . Shoulder arthroscopy distal clavicle excision and open rotator cuff repair     right  . Neck surgery     discectomy  . Abdominal hysterectomy     took one ovary  . Cholecystectomy     Family History  Problem Relation Age of Onset  . COPD Mother   .  Pneumonia Mother   . Other Mother     CHF  . Hypertension Mother   . Arthritis Mother     septic knee after replacement  . Heart disease Mother     chf  . Dementia Father   . Arthritis Brother   . Asthma Daughter   . Cancer Maternal Aunt     breast  . Stroke Maternal Grandmother   . Heart disease Maternal Grandmother   . Other Maternal Grandfather     CHF  . Heart disease Maternal Grandfather     chf  . Cancer Paternal Grandmother     stomach?  . Other Paternal Grandfather     problems with kidneys  . Kidney disease Paternal Grandfather   . Anxiety disorder Daughter   . Cancer Maternal Aunt     liver    History   Social History  . Marital Status: Married    Spouse Name: N/A     Number of Children: N/A  . Years of Education: N/A   Occupational History  . Not on file.   Social History Main Topics  . Smoking status: Former Smoker -- 1.5 packs/day for 30 years    Types: Cigarettes    Quit date: 10/13/1991  . Smokeless tobacco: Never Used  . Alcohol Use: No  . Drug Use: No  . Sexually Active: No   Other Topics Concern  . Not on file   Social History Narrative  . No narrative on file    No current outpatient prescriptions on file prior to visit.    Allergies  Allergen Reactions  . Codeine     REACTION: nausea    Review of Systems  Review of Systems  Constitutional: Negative for fever, chills and malaise/fatigue.  HENT: Negative for hearing loss, nosebleeds and congestion.   Eyes: Negative for discharge.  Respiratory: Negative for cough, sputum production, shortness of breath and wheezing.   Cardiovascular: Negative for chest pain, palpitations and leg swelling.  Gastrointestinal: Positive for abdominal pain. Negative for heartburn, nausea, vomiting, diarrhea, constipation and blood in stool.       Describes some lower abdominal pressure. No severe pain  Genitourinary: Negative for dysuria, urgency, frequency and hematuria.       Vaginal itching and irritation noted for past 1-2 weeks, tried Monostat which helped some but symptoms returned.  G4P4 s/p 4 svd, menarche at 8 yr, hysterectomy in 58. Bladder tack  Musculoskeletal: Negative for myalgias, back pain and falls.  Skin: Negative for rash.  Neurological: Negative for dizziness, tremors, sensory change, focal weakness, loss of consciousness, weakness and headaches.  Endo/Heme/Allergies: Negative for polydipsia. Does not bruise/bleed easily.  Psychiatric/Behavioral: Negative for depression and suicidal ideas. The patient is not nervous/anxious and does not have insomnia.     Objective  BP 131/84  Pulse 73  Temp(Src) 97.7 F (36.5 C) (Oral)  Ht 5\' 6"  (1.676 m)  Wt 221 lb 12.8 oz  (100.608 kg)  BMI 35.80 kg/m2  SpO2 96%  Physical Exam  Physical Exam  Constitutional: She is oriented to person, place, and time and well-developed, well-nourished, and in no distress. No distress.  HENT:  Head: Normocephalic and atraumatic.  Right Ear: External ear normal.  Left Ear: External ear normal.  Nose: Nose normal.  Mouth/Throat: Oropharynx is clear and moist. No oropharyngeal exudate.  Eyes: Conjunctivae are normal. Pupils are equal, round, and reactive to light. Right eye exhibits no discharge. Left eye exhibits no discharge. No scleral icterus.  Neck:  Normal range of motion. Neck supple. No thyromegaly present.  Cardiovascular: Normal rate, regular rhythm, normal heart sounds and intact distal pulses.   No murmur heard. Pulmonary/Chest: Effort normal and breath sounds normal. No respiratory distress. She has no wheezes. She has no rales.  Abdominal: Soft. Bowel sounds are normal. She exhibits no distension and no mass. There is no tenderness.  Genitourinary: Right adnexa normal and left adnexa normal. Vaginal discharge found.       no vaginal lesions, no cervix  Musculoskeletal: Normal range of motion. She exhibits no edema and no tenderness.  Lymphadenopathy:    She has no cervical adenopathy.  Neurological: She is alert and oriented to person, place, and time. She has normal reflexes. No cranial nerve deficit. Coordination normal.  Skin: Skin is warm and dry. No rash noted. She is not diaphoretic.  Psychiatric: Mood, memory and affect normal.       Assessment & Plan  VITAMIN D DEFICIENCY Patient agrees to have level rechecked  VENOUS INSUFFICIENCY No recent difficulty but does have some trouble with edema and varicosities at times. 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 She is encouraged to figure out a way to use her machine  OTHER URINARY INCONTINENCE Patient notes she has had trouble with urge incontinence for several years now ever since she underwent a bladder tack, She feels the symptoms are manageable as long as she uses the bathroom regularly and does not ignore the urges. She had been asked in the past to self cath but does not wish to do this at this time.  OBESITY Patient is given a handout to ask her to consider the DASH diet as a way to manage multiple issues including her obesity. Needs small, frequent meals with lean proteins and complex carbs  HYPOTHYROIDISM On Levothyroxine 150, will recheck TSH and change dosing as indicated  HYPERTENSION Adequately controlled, no change in medications at this time  HAND PAIN Chronic and b/l despite b/l CTR and a procedure on her left thumb. Patient even notes some neuropathy and weakness in additionto the discomfort. No definitive diagnosis. May need neurologic or another orthopaedic consult in the future  GERD Need sto avoid offending foods and no change in meds at today's visit  DYSTHYMIA Denies full blown depression but acknowledges feeling more low ever since her mother died 3 months ago. Declines medications at this time  DIABETES MELLITUS, BORDERLINE Patient is not checking sugars but agrees to lab work prior to next visit to further evaluate. 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

## 2011-01-19 ENCOUNTER — Telehealth: Payer: Self-pay | Admitting: *Deleted

## 2011-01-19 MED ORDER — SULFAMETHOXAZOLE-TRIMETHOPRIM 800-160 MG PO TABS: 1.0000 | ORAL_TABLET | Freq: Two times a day (BID) | ORAL | Status: AC

## 2011-01-19 NOTE — Telephone Encounter (Signed)
Message copied by Francee Piccolo on Mon Jan 19, 2011 10:26 AM ------      Message from: Danise Edge      Created: Sat Jan 17, 2011  1:42 PM       Notify pos uti ecoli, needs bactrim DS 1 tab po bid x 7days

## 2011-01-19 NOTE — Telephone Encounter (Signed)
Pt notified.  Bactrim sent to pharm.

## 2011-01-19 NOTE — Telephone Encounter (Signed)
Agree with your advice.

## 2011-01-19 NOTE — Telephone Encounter (Signed)
Pt called back.  Notified of culture results.  She is agreeable to abx. Pt also states the rash on legs and lower abdomen is getting worse.  Pt was given Nystatin cream by Dr. Abner Greenspan.  Pt has used Desitin cream today, but has not noticed any improvement.  Advised pt to keep area dry.  To use blow dryer after showering or being outside to make sure area is dry.  Continue to use Nystatin cream as directed.  Please advise any additional advice.

## 2011-01-19 NOTE — Telephone Encounter (Signed)
I have attempted to contact this patient by phone with the following results: left message to return my call on answering machine.

## 2011-01-20 ENCOUNTER — Telehealth: Payer: Self-pay | Admitting: Family Medicine

## 2011-01-20 NOTE — Telephone Encounter (Signed)
Patient called stating her nystatin has caused her to break out with red areas where the cream was put on her body.

## 2011-01-20 NOTE — Telephone Encounter (Signed)
Pt states area has spread down legs and up stomach.  Area is worse than before.  Pt denies blisters or any open areas.  Area is very itchy.  Nystatin and Desitin are not helping.  Recommend office visit. Pt will be seen on 4/11 @ 9:30am.

## 2011-01-21 ENCOUNTER — Ambulatory Visit (INDEPENDENT_AMBULATORY_CARE_PROVIDER_SITE_OTHER): Payer: Medicare Other | Admitting: Family Medicine

## 2011-01-21 ENCOUNTER — Encounter: Payer: Self-pay | Admitting: Family Medicine

## 2011-01-21 VITALS — BP 138/83 | HR 80 | Ht 66.0 in | Wt 224.0 lb

## 2011-01-21 DIAGNOSIS — L538 Other specified erythematous conditions: Secondary | ICD-10-CM

## 2011-01-21 DIAGNOSIS — L304 Erythema intertrigo: Secondary | ICD-10-CM

## 2011-01-21 MED ORDER — FLUCONAZOLE 150 MG PO TABS: ORAL_TABLET | ORAL | Status: DC

## 2011-01-21 MED ORDER — CLOTRIMAZOLE-BETAMETHASONE 1-0.05 % EX CREA: TOPICAL_CREAM | Freq: Two times a day (BID) | CUTANEOUS | Status: DC

## 2011-01-21 NOTE — Assessment & Plan Note (Signed)
Likely a component of fungal infection to this. Will switch from nystatin to lotrisone cream bid, and will give 5d burst of fluconazole 150mg  qd PO. Encouraged her to apply zinc oxide-containing cream/ointment OTC bid as well for the next week or two.

## 2011-01-21 NOTE — Progress Notes (Signed)
OFFICE NOTE  01/21/2011  CC:  Chief Complaint  Patient presents with  . Rash    lower abdomen, thighs     HPI:   Patient is a 70 y.o. Caucasian female who is here for rash. Has had itchy, pinkish rash under abdominal pannus and in groin area for the last couple of weeks approximately.  Getting painful at times lately, itching worse and worse. Not improving despite nystatin application twice daily for the last several days.  Started bactrim a couple of days ago for UTI, but the rash was already bothering her quite a bit prior to this. No similar skin rash anywhere else on her body.  No malaise, fatigue, or fevers.  Past Medical History  Diagnosis Date  . Allergy     rhinitis  . Obstructive sleep apnea   . Bronchitis   . Hypertension   . Chest pain   . Venous insufficiency   . Diabetes mellitus     borderline  . Obesity   . Hypothyroidism   . GERD (gastroesophageal reflux disease)   . Diverticulosis of colon   . Urinary incontinence   . Degenerative joint disease   . Lumbar back pain   . Vitamin D deficiency   . Dysthymia   . Asthma     mild, intermittent  . Transient memory loss March 2006    after a stressful event she had no memory the rest of that day  . VITAMIN D DEFICIENCY 08/21/2008  . VENOUS INSUFFICIENCY 08/23/2007  . SLEEP APNEA, OBSTRUCTIVE 08/23/2007  . Other urinary incontinence 03/12/2010  . OBESITY 08/23/2007  . HYPOTHYROIDISM 08/23/2007  . HYPERTENSION 02/22/2008  . HAND PAIN 04/09/2010  . GERD 08/23/2007  . 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  . DIVERTICULOSIS OF COLON 02/22/2008  . DIABETES MELLITUS, BORDERLINE 08/21/2008  . DEGENERATIVE JOINT DISEASE 02/22/2008  . BRONCHITIS, RECURRENT 02/22/2008  . BACK PAIN, LUMBAR 08/23/2007  . ALLERGIC RHINITIS 08/23/2007  . Asthma 01/14/2011  . 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 History of chicken pox 01/14/2011  . History of measles 01/14/2011     MEDS;   Outpatient Prescriptions Prior to Visit  Medication Sig Dispense  Refill  . albuterol (PROAIR HFA) 108 (90 BASE) MCG/ACT inhaler Inhale 2 puffs into the lungs every 6 (six) hours as needed. For wheezing       . ALPRAZolam (XANAX) 0.5 MG tablet Take 0.5 mg by mouth 3 (three) times daily as needed.        . budesonide-formoterol (SYMBICORT) 160-4.5 MCG/ACT inhaler Inhale 1 puff into the lungs daily.        . calcium carbonate (OS-CAL) 600 MG TABS Take 600 mg by mouth 2 (two) times daily with a meal.        . famotidine (PEPCID) 10 MG tablet Take 10 mg by mouth 2 (two) times daily as needed.        . furosemide (LASIX) 40 MG tablet Take 40 mg by mouth daily as needed.       . Gabapentin (NEURONTIN PO) Take by mouth as needed.        Marland Kitchen guaiFENesin (MUCINEX) 600 MG 12 hr tablet Take 1,200 mg by mouth 2 (two) times daily.        Marland Kitchen levothyroxine (SYNTHROID, LEVOTHROID) 125 MCG tablet Take 1 tablet (125 mcg total) by mouth daily.  30 tablet  1  . nystatin (MYCOSTATIN) ointment Apply topically 2 (two) times daily.  30 g  0  . potassium chloride SA (K-DUR,KLOR-CON) 20  MEQ tablet Take 20 mEq by mouth daily as needed. With lasix      . sulfamethoxazole-trimethoprim (BACTRIM DS) 800-160 MG per tablet Take 1 tablet by mouth 2 (two) times daily.  14 tablet  0  . ergocalciferol (VITAMIN D2) 50000 UNITS capsule Take 50,000 Units by mouth once a week.        . meloxicam (MOBIC) 7.5 MG tablet Take 7.5 mg by mouth daily as needed. For arthritis pain        Allergies  Allergen Reactions  . Codeine     REACTION: nausea  . Prednisone     "I forget who I am"     PE:  Blood pressure 138/83, pulse 80, height 5\' 6"  (1.676 m), weight 224 lb (101.606 kg). Gen: Alert, well appearing, obese white female.  Patient is oriented to person, place, time, and situation.  SKIN: generlized pinkish macular rash under abdominal pannus and in skin folds of the perineum/groin. Well demarcated margins.  Scant areas of focal maceration but no significant skin breakdown.  Nontender.  IMPRESSION  AND PLAN:  Intertrigo Likely a component of fungal infection to this. Will switch from nystatin to lotrisone cream bid, and will give 5d burst of fluconazole 150mg  qd PO. Encouraged her to apply zinc oxide-containing cream/ointment OTC bid as well for the next week or two.      FOLLOW UP:  Return if symptoms worsen or fail to improve.

## 2011-01-23 NOTE — Telephone Encounter (Signed)
Made in error

## 2011-01-26 NOTE — Progress Notes (Signed)
Pt informed

## 2011-02-12 ENCOUNTER — Encounter: Payer: Self-pay | Admitting: Family Medicine

## 2011-02-12 ENCOUNTER — Ambulatory Visit (INDEPENDENT_AMBULATORY_CARE_PROVIDER_SITE_OTHER): Payer: Medicare Other | Admitting: Family Medicine

## 2011-02-12 DIAGNOSIS — E559 Vitamin D deficiency, unspecified: Secondary | ICD-10-CM

## 2011-02-12 DIAGNOSIS — I10 Essential (primary) hypertension: Secondary | ICD-10-CM

## 2011-02-12 DIAGNOSIS — E669 Obesity, unspecified: Secondary | ICD-10-CM

## 2011-02-12 DIAGNOSIS — J209 Acute bronchitis, unspecified: Secondary | ICD-10-CM

## 2011-02-12 DIAGNOSIS — L538 Other specified erythematous conditions: Secondary | ICD-10-CM

## 2011-02-12 DIAGNOSIS — B379 Candidiasis, unspecified: Secondary | ICD-10-CM

## 2011-02-12 DIAGNOSIS — G629 Polyneuropathy, unspecified: Secondary | ICD-10-CM

## 2011-02-12 DIAGNOSIS — J42 Unspecified chronic bronchitis: Secondary | ICD-10-CM

## 2011-02-12 DIAGNOSIS — G609 Hereditary and idiopathic neuropathy, unspecified: Secondary | ICD-10-CM

## 2011-02-12 DIAGNOSIS — R609 Edema, unspecified: Secondary | ICD-10-CM

## 2011-02-12 DIAGNOSIS — L304 Erythema intertrigo: Secondary | ICD-10-CM

## 2011-02-12 HISTORY — DX: Polyneuropathy, unspecified: G62.9

## 2011-02-12 MED ORDER — CIPROFLOXACIN HCL 500 MG PO TABS: 500.0000 mg | ORAL_TABLET | Freq: Two times a day (BID) | ORAL | Status: AC

## 2011-02-12 MED ORDER — BENZONATATE 100 MG PO CAPS: 100.0000 mg | ORAL_CAPSULE | Freq: Three times a day (TID) | ORAL | Status: AC | PRN

## 2011-02-12 MED ORDER — METHYLPREDNISOLONE 4 MG PO KIT: PACK | ORAL | Status: AC

## 2011-02-12 MED ORDER — FUROSEMIDE 40 MG PO TABS: 40.0000 mg | ORAL_TABLET | Freq: Every day | ORAL | Status: DC | PRN

## 2011-02-12 MED ORDER — FLUCONAZOLE 150 MG PO TABS: ORAL_TABLET | ORAL | Status: DC

## 2011-02-12 MED ORDER — POTASSIUM CHLORIDE CRYS ER 20 MEQ PO TBCR: 20.0000 meq | EXTENDED_RELEASE_TABLET | Freq: Every day | ORAL | Status: DC | PRN

## 2011-02-12 NOTE — Assessment & Plan Note (Signed)
Well controlled, no change in medicaitons

## 2011-02-12 NOTE — Assessment & Plan Note (Signed)
Continue current supplement and will consider rechecking levels with future lab work.

## 2011-02-12 NOTE — Assessment & Plan Note (Signed)
Symptoms have worsened over the past week. She has begun to wheeze and cough more. Will send in Medrol dose pack and has tolerated Cipro in the past, 10 day course given. Cont Mucinex, Tessalon Perles for cough and may increase Symbicort temporarily as instruced

## 2011-02-12 NOTE — Assessment & Plan Note (Signed)
Skin improved with Diflucan and Aquaphor. Likely Candidal is given Diflucan to take while on antibiotics or as needed if yeast returns. Also encouraged a daily yogurt and probiotic

## 2011-02-12 NOTE — Assessment & Plan Note (Signed)
Patient has been noting increased burning in feet and lower legs intermittently, worse at end of day for several months. Has previously been prescribed Gabapentin by another doctor but has not been taking it. She had been encouraged to take 1 qhs and titrate up to 3 qhs as tolerated. Likely she has the 100mg  tabs, she is asked to bring the bottle to her next visit. She will restart the tabs as directed and we will reassess symptoms at next visit

## 2011-02-12 NOTE — Progress Notes (Signed)
Jenny [name] [id number] 1941-08-26 02/12/2011      Progress Note-Follow Up  Subjective  Chief Complaint  Chief Complaint  Patient presents with  . Bronchitis    X 4 days    HPI  Patient is a 70 year old Caucasian female in today for followup and evaluation of new bronchitic symptoms. Patient reports roughly 6 days ago developing from increased head congestion and a mild cough then roughly 4 days ago her cough increased and is now productive of brown sputum she is having some shortness of breath and occasional wheezing postnasal drip as developed. She also notes a frontal headache but denies fevers, chills, ear pain, sore throat, chest pain, palpitations, GI or GU complaints. She denies nausea, anorexia. The cough does keep her up somewhat at night Delsym helps slightly. Last night she had a Mucinex and a last dose of Medrol at home so she took those and she does feel slightly better this morning. She also notes since her recent course of antibiotics and Diflucan her bowels continue to move normally but she does note an increase in some gaseousness. No bloody or tarry stool no abdominal pain. She's finally complaining of some intermittent difficulties with what she describes as tingling and burning in bilateral feet and lower legs. It comes and goes it has been present for several months. She was previously described gabapentin to take one up to 3 each bedtime by the physician but is not taking it at this time. Did not have any difficulty with the medication when she took it but never titrated up to 3 tabs she has been using her albuterol somewhat more with good effect. She continues to use her Symbicort daily.  Past Medical History  Diagnosis Date  . Allergy     rhinitis  . Obstructive sleep apnea   . Bronchitis   . Hypertension   . Chest pain   . Venous insufficiency   . Diabetes mellitus     borderline  . Obesity   . Hypothyroidism   . GERD (gastroesophageal reflux disease)   .  Diverticulosis of colon   . Urinary incontinence   . Degenerative joint disease   . Lumbar back pain   . Vitamin D deficiency   . Dysthymia   . Asthma     mild, intermittent  . Transient memory loss March 2006    after a stressful event she had no memory the rest of that day  . VITAMIN D DEFICIENCY 08/21/2008  . VENOUS INSUFFICIENCY 08/23/2007  . SLEEP APNEA, OBSTRUCTIVE 08/23/2007  . Other urinary incontinence 03/12/2010  . OBESITY 08/23/2007  . HYPOTHYROIDISM 08/23/2007  . HYPERTENSION 02/22/2008  . HAND PAIN 04/09/2010  . GERD 08/23/2007  . 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  . DIVERTICULOSIS OF COLON 02/22/2008  . DIABETES MELLITUS, BORDERLINE 08/21/2008  . DEGENERATIVE JOINT DISEASE 02/22/2008  . BRONCHITIS, RECURRENT 02/22/2008  . BACK PAIN, LUMBAR 08/23/2007  . ALLERGIC RHINITIS 08/23/2007  . Asthma 01/14/2011  . 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 History of chicken pox 01/14/2011  . History of measles 01/14/2011  . Peripheral neuropathy 02/12/2011    Past Surgical History  Procedure Date  . Appendectomy   . Tonsillectomy and adenoidectomy   . Ap repair and sling for cystocele 8-04     Dr Patsi Sears  . Left hand 2010    left thumb, joint removal, [facility]  . Ctr     right wrist  . Shoulder arthroscopy distal clavicle excision and open rotator cuff repair  right  . Neck surgery     discectomy  . Abdominal hysterectomy     took one ovary  . Cholecystectomy     Family History  Problem Relation Age of Onset  . COPD Mother   . Pneumonia Mother   . Other Mother     CHF  . Hypertension Mother   . Arthritis Mother     septic knee after replacement  . Heart disease Mother     chf  . Dementia Father   . Arthritis Brother   . Asthma Daughter   . Cancer Maternal Aunt     breast  . Stroke Maternal Grandmother   . Heart disease Maternal Grandmother   . Other Maternal Grandfather     CHF  . Heart disease Maternal Grandfather     chf  . Cancer Paternal Grandmother     stomach?  .  Other Paternal Grandfather     problems with kidneys  . Kidney disease Paternal Grandfather   . Anxiety disorder Daughter   . Cancer Maternal Aunt     liver    History   Social History  . Marital Status: Married    Spouse Name: N/A    Number of Children: N/A  . Years of Education: N/A   Occupational History  . Not on file.   Social History Main Topics  . Smoking status: Former Smoker -- 1.5 packs/day for 30 years    Types: Cigarettes    Quit date: 10/13/1991  . Smokeless tobacco: Never Used  . Alcohol Use: No  . Drug Use: No  . Sexually Active: No   Other Topics Concern  . Not on file   Social History Narrative  . No narrative on file    Current Outpatient Prescriptions on File Prior to Visit  Medication Sig Dispense Refill  . albuterol (PROAIR HFA) 108 (90 BASE) MCG/ACT inhaler Inhale 2 puffs into the lungs every 6 (six) hours as needed. For wheezing       . ALPRAZolam (XANAX) 0.5 MG tablet Take 0.5 mg by mouth 3 (three) times daily as needed.        . budesonide-formoterol (SYMBICORT) 160-4.5 MCG/ACT inhaler Inhale 1 puff into the lungs daily.        . calcium carbonate (OS-CAL) 600 MG TABS Take 600 mg by mouth 2 (two) times daily with a meal.        . Cholecalciferol (VITAMIN D) 1000 UNITS capsule Take 1,000 Units by mouth daily.        . clotrimazole-betamethasone (LOTRISONE) cream Apply topically 2 (two) times daily.  45 g  1  . famotidine (PEPCID) 10 MG tablet Take 10 mg by mouth 2 (two) times daily as needed.        . Gabapentin (NEURONTIN PO) Take by mouth as needed.        Marland Kitchen guaiFENesin (MUCINEX) 600 MG 12 hr tablet Take 1,200 mg by mouth 2 (two) times daily.        Marland Kitchen levothyroxine (SYNTHROID, LEVOTHROID) 125 MCG tablet Take 1 tablet (125 mcg total) by mouth daily.  30 tablet  1  . DISCONTD: fluconazole (DIFLUCAN) 150 MG tablet 1 tab po qd x 5d  5 tablet  0  . DISCONTD: furosemide (LASIX) 40 MG tablet Take 40 mg by mouth daily as needed.       Marland Kitchen DISCONTD:  nystatin (MYCOSTATIN) ointment Apply topically 2 (two) times daily.  30 g  0  . DISCONTD: potassium chloride SA (  K-DUR,KLOR-CON) 20 MEQ tablet Take 20 mEq by mouth daily as needed. With lasix        Allergies  Allergen Reactions  . Codeine     REACTION: nausea  . Prednisone     "I forget who I am"    Review of Systems  Review of Systems  Constitutional: Negative for fever, chills and malaise/fatigue.  HENT: Positive for congestion. Negative for sore throat and ear discharge.   Eyes: Negative for discharge.  Respiratory: Positive for cough, sputum production, shortness of breath and wheezing. Negative for stridor.   Cardiovascular: Positive for PND. Negative for chest pain, palpitations and leg swelling.  Gastrointestinal: Negative for nausea, abdominal pain and diarrhea.  Genitourinary: Negative for dysuria.  Musculoskeletal: Negative for falls.  Skin: Negative for rash.  Neurological: Positive for tingling. Negative for loss of consciousness and headaches.       B/l feet and lower legs  Endo/Heme/Allergies: Negative for polydipsia.  Psychiatric/Behavioral: Negative for depression and suicidal ideas. The patient is not nervous/anxious and does not have insomnia.     Objective  BP 132/84  Pulse 75  Temp(Src) 97.6 F (36.4 C) (Oral)  Ht 5\' 6"  (1.676 m)  Wt 221 lb 12.8 oz (100.608 kg)  BMI 35.80 kg/m2  SpO2 95%  Physical Exam  Physical Exam  Constitutional: She is oriented to person, place, and time and well-developed, well-nourished, and in no distress. No distress.  HENT:  Head: Normocephalic and atraumatic.  Eyes: Conjunctivae are normal.  Neck: Neck supple. No thyromegaly present.  Cardiovascular: Normal rate, regular rhythm and normal heart sounds.   No murmur heard. Pulmonary/Chest: Effort normal. She has wheezes.       Wheezing noted b/l bases, expiratory  Abdominal: She exhibits no distension and no mass.  Musculoskeletal: She exhibits edema.       Trace  pedal edema b/l  Lymphadenopathy:    She has no cervical adenopathy.  Neurological: She is alert and oriented to person, place, and time.  Skin: Skin is warm and dry. No rash noted. She is not diaphoretic.  Psychiatric: Memory, affect and judgment normal.    Lab Results  Component Value Date   TSH 0.07* 01/14/2011   Lab Results  Component Value Date   WBC 7.8 01/14/2011   HGB 13.9 01/14/2011   HCT 41.3 01/14/2011   MCV 89.4 01/14/2011   PLT 235.0 01/14/2011   Lab Results  Component Value Date   CREATININE 0.9 01/14/2011   BUN 14 01/14/2011   NA 144 01/14/2011   K 5.1 01/14/2011   CL 105 01/14/2011   CO2 31 01/14/2011   Lab Results  Component Value Date   ALT 16 01/14/2011   AST 20 01/14/2011   ALKPHOS 84 01/14/2011   BILITOT 0.8 01/14/2011   Lab Results  Component Value Date   CHOL 132 01/14/2011   Lab Results  Component Value Date   HDL 39.70 01/14/2011   Lab Results  Component Value Date   LDLCALC 76 01/14/2011   Lab Results  Component Value Date   TRIG 84.0 01/14/2011   Lab Results  Component Value Date   CHOLHDL 3 01/14/2011     Assessment & Plan  Vitamin D deficiency Continue current supplement and will consider rechecking levels with future lab work.  OBESITY Patient weight stable despite recent [place] cruise, encouraged small, frequent meals with lean proteins and complex carbs  Intertrigo Skin improved with Diflucan and Aquaphor. Likely Candidal is given Diflucan to take while  on antibiotics or as needed if yeast returns. Also encouraged a daily yogurt and probiotic  HYPERTENSION Well controlled, no change in medicaitons  BRONCHITIS, RECURRENT Symptoms have worsened over the past week. She has begun to wheeze and cough more. Will send in Medrol dose pack and has tolerated Cipro in the past, 10 day course given. Cont Mucinex, Tessalon Perles for cough and may increase Symbicort temporarily as instruced  Peripheral neuropathy Patient has been noting increased burning in feet  and lower legs intermittently, worse at end of day for several months. Has previously been prescribed Gabapentin by another doctor but has not been taking it. She had been encouraged to take 1 qhs and titrate up to 3 qhs as tolerated. Likely she has the 100mg  tabs, she is asked to bring the bottle to her next visit. She will restart the tabs as directed and we will reassess symptoms at next visit

## 2011-02-12 NOTE — Patient Instructions (Signed)
Acute Bronchitis You have acute bronchitis. This means you have a chest cold. The airways in your lungs are inflamed (red and sore). Acute means it is sudden onset. Bronchitis is most often caused by a virus. In smokers, people with chronic lung problems, and elderly patients, treatment with antibiotics for bacterial infection may be needed. Exposure to cigarette smoke or irritating chemicals will make bronchitis worse. Allergies and asthma can also make bronchitis worse. Repeated episodes of bronchitis may cause long standing lung problems. Acute bronchitis is usually treated with rest, fluids, and medicines for relief of fever or cough. Bronchodilator medicines from metered inhalers or a nebulizer may be used to help open up the small airways. This reduces shortness of breath and helps control cough. Antibiotics can be prescribed if you are more seriously ill or at risk. A cool air vaporizer may help thin bronchial secretions and make it easier to clear your chest. Increased fluids may also help. You must avoid smoking, even second hand exposure. If you are a cigarette smoker, consider using nicotine gum or skin patches to help control withdrawal symptoms. Recovery from bronchitis is often slow, but you should start feeling better after 2-3 days. Cough from bronchitis frequently lasts for 3-4 weeks.  SEEK IMMEDIATE MEDICAL CARE IF YOU DEVELOP:  Increased fever, chills, or chest pain.   Severe shortness of breath or bloody sputum.   Dehydration, fainting, repeated vomiting, severe headache.   No improvement after one week of proper treatment.  MAKE SURE YOU:   Understand these instructions.   Will watch your condition.   Will get help right away if you are not doing well or get worse.  Document Released: 11/05/2004 Document Re-Released: 09/10/2008 [facility] Patient Information 2011 [place], [place].  May increase Symbicort 160/4.5 to 1-2 puffs twice a day temporarily when breathing becomes more  difficult.  Add a probiotic such as Align caps daily whenever you take an antibiotic and eat a yogurt daily Add Benefiber powder 2 tsp twice daily to food or beverage

## 2011-02-12 NOTE — Assessment & Plan Note (Signed)
Patient weight stable despite recent [employer], encouraged small, frequent meals with lean proteins and complex carbs

## 2011-02-13 ENCOUNTER — Ambulatory Visit: Payer: Medicare Other | Admitting: Family Medicine

## 2011-02-24 NOTE — Assessment & Plan Note (Signed)
[facility]                            CARDIOLOGY OFFICE NOTE   ROSEMARIA, INABINET                     MRN:          [id number]  DATE:03/28/2007                            DOB:          11-08-1940    PRIMARY CARE PHYSICIAN:  Lonzo Cloud. Kriste Basque, MD.   REASON FOR VISIT:  Evaluate patient with lower extremity swelling and  chest pain.   HISTORY OF PRESENT ILLNESS:  The patient is a pleasant, 70 year old,  white female with recent history of her feet swelling. This has been  going on for several weeks. She has been to see several doctors. She was  treated with diuretics which has improved this. She saw a vein  specialist who said she did not have venous incompetence. She is being  followed for some neck pain and might eventually need neck surgery. She  was referred by the neurosurgeon for evaluation prior to any  consideration of surgery. She has also seen Dr. Kriste Basque for management of  the swelling and is referred for evaluation of this. She has not had any  significant weight gain. She does not describe any PND or orthopnea  though she has chronically slept two pillows. She does have sleep apnea  and is using CPAP. She says with this she is able to sleep better though  only 3-4 hours per night. She said this has improved some chronic  exercise intolerance that had acutely gotten worse in the last few  months. She is still finding it hard to do normal activities such as  making the bed without getting tired. Her most exerting activity is  vacuuming. She says she will get dyspneic with this which has been  progressive. She does have some chest discomfort. This happens at rest.  It has been happening 2-3 times per week. It is not reproducible with  activity. It is  pressure. It is mild. It does radiate somewhat into her  jaw. There is no associated nausea, vomiting or diaphoresis. There is no  associated palpitations, presyncope or syncope.   PAST  MEDICAL HISTORY:  Asthmatic bronchitis, hypothyroidism,  gastroesophageal reflux disease, sleep apnea, Scarlet fever.   PAST SURGICAL HISTORY:  Hysterectomy, cholecystectomy, carpal tunnel  surgery, cervical disk surgery, rotator cuff surgery, tonsillectomy,  appendectomy.   ALLERGIES:  CODEINE.   MEDICATIONS:  1. Synthroid 150 mcg daily.  2. Singulair 10 mg daily.  3. Advair.  4. Lasix 40 mg daily.  5. Potassium.  6. Medrol p.r.n.  7. Mucinex p.r.n.  8. Darvocet p.r.n.  9. Nexium 40 mg daily.  10.Vesicare.  11.CPAP.   SOCIAL HISTORY:  The patient is retired. She is a widow. She had 4  children with 3 living. She cares for her grandchildren whose mother  died. She smoked 1-1/2 pack per day for 30 years but quit in 1993.   FAMILY HISTORY:  Noncontributory for early coronary artery disease.   REVIEW OF SYSTEMS:  As stated in the HPI positive for reflux,  diverticulitis, joint pains. Negative for all other systems.   PHYSICAL EXAMINATION:  GENERAL:  The patient is in no  acute distress.  VITAL SIGNS:  Blood pressure 142/90, heart rate 90 and regular, weight  237 pounds, body mass index 39.  HEENT:  Eyelids unremarkable. Pupils equal round and reactive to light.  Fundi not visualized. Oral mucosa unremarkable.  NECK:  No jugular venous distention at 45 degrees, carotid upstroke  brisk and symmetric, no bruits, no thyromegaly.  LYMPHATICS:  No cervical, axillary or inguinal adenopathy.  LUNGS:  Clear to auscultation bilaterally.  BACK:  No costovertebral angle tenderness.  CHEST:  Unremarkable.  HEART:  PMI not displaced or sustained, S1 and S2 within normal limits,  no S3, no S4, no clicks, no rubs, no murmurs.  ABDOMEN:  Morbidly obese, positive bowel sounds, normal in frequency and  pitch, no bruits, no rebound, no guarding, no midline pulsatile mass, no  hepatomegaly, no splenomegaly.  SKIN:  No rashes, no nodules.  EXTREMITIES:  2+ pulses throughout, mild bilateral  lower extremity  edema, mild bilateral chronic venous stasis changes.  NEUROLOGIC:  Oriented to person, place and time. Cranial nerves II-XII  grossly intact. Motor grossly intact.   EKG:  Sinus rhythm, rate 90, axis within normal limits, interval is  within normal limits, no acute ST wave changes.   ASSESSMENT/PLAN:  1. Chest discomfort. The patient's chest discomfort had some worrisome      features for angina. She said she would not be able to walk on a      treadmill. Therefore, she will need an adenosine perfusion study.      Further evaluation based on these results.  2. Lower extremity swelling. The patient has this as well as dyspnea      on exertion. She was told she did not have a lower extremity venous      problem. I need to rule out any cardiac etiology such as pulmonary      hypertension though I do not suspect any on physical. She will get      an echocardiogram. She will continue with conservative measures of      taking her Lasix. We talked about keeping her feet elevated. She      did recently get some compression stockings and will start wearing      these. She is going to avoid salt.  3. Obesity. She understands the need to lose weight through diet and      exercise. She is morbidly obese.  4. Hypertension. Blood pressure is borderline. It has not been      elevated before. This should be      treated with therapeutic lifestyle changes (PLC) to include weight      loss.  5. Followup will be based on the results of the above.     Rollene Rotunda, MD, [facility]  Electronically Signed    JH/MedQ  DD: 03/28/2007  DT: 03/28/2007  Job #: [id number]   cc:   Lonzo Cloud. Kriste Basque, MD

## 2011-02-26 ENCOUNTER — Telehealth: Payer: Self-pay

## 2011-02-26 ENCOUNTER — Ambulatory Visit: Payer: Medicare Other

## 2011-02-26 NOTE — Telephone Encounter (Signed)
Left message on cell and with [facility] at home number to return my call. Pt needs to come in this morning for Zostavax or tomorrow. There will be no MD in office this afternoon.

## 2011-02-26 NOTE — Telephone Encounter (Signed)
Pt is going to wait until her follow up appt in June.

## 2011-02-27 NOTE — Procedures (Signed)
NAME:  Jenny [name], Jenny [name]             ACCOUNT NO.:  [id number]   MEDICAL RECORD NO.:  [id number]          PATIENT TYPE:  OUT   LOCATION:  SLEEP CENTER                 FACILITY:  [facility]   PHYSICIAN:  Barbaraann Share, MD,FCCPDATE OF BIRTH:  1940-11-11   DATE OF STUDY:  01/13/2007                            NOCTURNAL POLYSOMNOGRAM   REFERRING PHYSICIAN:  Lonzo Cloud. Kriste Basque, MD   INDICATION FOR STUDY:  Hypersomnia with sleep apnea.   EPWORTH SLEEPINESS SCORE:  Is 19.   MEDICATIONS:   SLEEP ARCHITECTURE:  The patient had a total sleep time of 436 minutes  with adequate slow wave sleep, but significantly decreased REM.  Sleep  onset latency was normal and REM onset was quite prolonged.  Sleep  efficiency was mildly decreased at 89%.   RESPIRATORY DATA:  The patient was found to have 97 hypopnea and 116  obstructive apnea, for an apnea/hypopnea index of 29 events per hour.  The events occurred in all body positions but were clearly more severe  during REM.  There was moderate to loud snoring noted throughout.   OXYGEN DATA:  There is O2 desaturation as low as 62% with the patient's  obstructive events.   CARDIAC DATA:  No clinically significant cardiac arrhythmias were noted.   MOVEMENT-PARASOMNIA:  Rare leg jerk without clinical significance.   IMPRESSIONS-RECOMMENDATIONS:  1. Moderate obstructive sleep apnea/hypopnea syndrome with an      apnea/hypopnea index of 29 events per hour      and an O2 desaturation as low as 62%.  Treatment for this degree of      sleep apnea should focus on weight loss, upper airway surgery or      appliance, and also CPAP.  Clinical correlation is suggested.      Barbaraann Share, MD,FCCP  Diplomate, American Board of Sleep  Medicine  Electronically Signed     KMC/MEDQ  D:  01/28/2007 18:14:19  T:  01/29/2007 14:36:24  Job:  [id number]   cc:   Lonzo Cloud. Kriste Basque, MD  [address]  [place]  [place] [address]

## 2011-02-27 NOTE — Procedures (Signed)
EEG NUMBER:  [id number].   MEDICATIONS LISTED:  Synthroid, Lasix, Prevacid and Singulair.   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   TECHNICAL DESCRIPTION:  This EEG was recorded during the awake state.  The  background activity shows 12 to 14 Hz rhythms with higher amplitude seen in  the posterior head regions bilaterally. Photic stimulation did produce a  driving response. Hyperventilation testing was performed which produced no  abnormalities. There was much eye blink artifact seen during this study.  There was no evidence of any stage II sleep or epileptiform activity seen.   IMPRESSION:  Normal EEG during the awake state.           ______________________________  Genene Churn. Sandria Manly, M.D.     [id number]  D:  12/02/2005 14:35:52  T:  12/03/2005 11:17:10  Job #:  [id number]   cc:   Santina Evans A. Orlin Hilding, M.D.  Fax: [phone]

## 2011-02-27 NOTE — Op Note (Signed)
NAME:  Jenny [name], Jenny [name]                       ACCOUNT NO.:  [id number]   MEDICAL RECORD NO.:  [id number]                   PATIENT TYPE:  AMB   LOCATION:  DAY                                  FACILITY:  [facility]   PHYSICIAN:  Sigmund I. Patsi Sears, M.D.         DATE OF BIRTH:  01-05-1941   DATE OF PROCEDURE:  06/11/2003  DATE OF DISCHARGE:                                 OPERATIVE REPORT   PREPROCEDURE DIAGNOSES:  1. 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  2. 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   POSTOPERATIVE DIAGNOSES:  1. 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  2. 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   PROCEDURE:  Anterior vaginal vault repair, Transobturator sling .  Cystoscopy.   SURGEON:  Sigmund I. Patsi Sears, M.D.   ASSISTANT:  Susanne Borders, M.D., [facility]   ANESTHESIA:  General endotracheal.   DRAINS:  16 French Foley catheter straight drainage.   ESTIMATED BLOOD LOSS:  200 mL   COMPLICATIONS:  None.   INDICATIONS FOR PROCEDURE:  Jenny [name] is a 70 year old female who has  problems with severe stress urinary incontinence. She notes leaking when she  coughs, laughs, sneezes or lifts heavy objects. She has had four children  through vaginal deliveries and she is status post hysterectomy in 1978. She  also complains of pressure with a mass-like sensation in her vagina. On  examination, she was found to have grade 3-4 cystocele and a positive  Marshall test and Q-tip test. The patient underwent careful consultation by  Dr. Patsi Sears and after understanding the risks and benefits has consented  to placement of a Transobturator sling as well as repair for cystocele.   DESCRIPTION OF PROCEDURE:  The patient was brought to the operating room and  correctly identified by her identification bracelet. She was given  preoperative antibiotics and shaved, prepped and draped in typical sterile  fashion. She was placed in dorsal lithotomy  position. A small suburethral  incision was made in the midline after placement of 16 French Foley catheter  and drainage of the bladder. Scissors were used to perform sharp dissection  laterally on both sides of the urethra. Further dissection was performed  bluntly with the tip of the surgeon's finger towards the obturator foramen.  This could be palpated easily bilaterally on both sides of  the urethra.  Palpation was done to feel the obturator foramen externally and the distance  was measured approximately 5 cm bilaterally from the level of the clitoris.  Small paralabial incisions were made in this area and the Transobturator  trocar was passed through these incisions until it could be palpated at the  tip of the surgeon's finger that was placed in the vaginal incision  bilaterally. The obturator was carefully passed through the vaginal incision  with extreme care taken not to damage the vaginal mucosa. The obturator tape  material was connected to the trocar and was pulled easily through stab  incisions on  both sides. The obturator tape was pulled on both sides such  that the black line indicating the midline of the obturator tape was in the  middle of the urethra. Cystoscopy was performed at this time to ensure that  the obturator tape had not penetrated the bladder mucosa. Careful  cystoscopic examination was performed with a 22 French sheath and 70 degree  lens. The bladder mucosa was within normal limits. The bladder mucosa was  carefully evaluated and no evidence of bladder mucosal trauma from the  passage of the obturator tape was appreciated. The cystoscope was removed,  the bladder was drained and the Foley catheter was replaced. The obturator  tape was pulled through the stab incisions on both sides such that a right  angle could easily be passed between the tape and the urethra. At this  point, the obturator tape was cut with Mayo scissors at the skin level on  both sides.  Next, the vaginal mucosal incision was extended caudally such  that the cystocele could be dissected away from the vaginal mucosa. Singly  scissors were then used to dissect the vaginal mucosa away from the bladder  mucosa. Blunt dissection was performed passing a sponge between the space  between the two tissue planes. Please note that prior to the vaginal  incisions at the beginning of the case, this plane was developed by  injection of 0.25% plain Marcaine just beneath the bladder mucosa. After  adequate dissection of the cystocele from the vaginal mucosa, the area where  the cardinal ligaments were in the pubocervical fascia could be appreciated.  At 6 o'clock, a 3-0 PDS was placed in a horizontal mattress fashion and  suture was tied. This adequately pulled the pubocervical fascia together in  the midline. A piece of polyglycolic acid mesh material was then placed  beneath this tissue layer such that it overlay the bladder mucosa. The  pubocervical fascia was then closed over the mesh material placing several  interrupted polyglycolic acid sutures in a horizontal fashion. Approximately  five sutures were placed. After placement of these sutures, the bladder  mucosa was nicely pulled up from the vaginal introitus. This was a nice  tight repair. Having reduced the cystocele, there was extra vaginal tissue  remaining which was trimmed slightly on both sides. The vaginal tissue was  then closed in two layers with running Vicryl sutures. The vagina was then  packed with the Estrace soaked gauze. The paralabial stab incisions were  closed with Dermabond. The Foley catheter was attached to a drainage bag and  was draining clear urine. The patient was awakened from her anesthesia  without complications. She was taken to the post anesthesia care unit in  stable condition. Please note that Dr. Patsi Sears was present and participated in all aspects of the case as he was the primary  surgeon.     Susanne Borders, MD                           Sigmund I. Patsi Sears, M.D.    DR/MEDQ  D:  06/11/2003  T:  06/11/2003  Job:  [id number]

## 2011-02-27 NOTE — Assessment & Plan Note (Signed)
Jenny [name]                             PULMONARY OFFICE NOTE   Jenny [name], Jenny [name]                    MRN:          [id number]  DATE:01/25/2007                            DOB:          [birth year]    HISTORY OF PRESENT ILLNESS:  The patient is a 70 year old white female  patient of Dr. Jodelle Green who has a known history of asthmatic bronchitis,  gastroesophageal reflux and hypothyroidism.  Presents today with  complaints over the last 2 weeks of increased lower extremity edema  bilaterally.  The patient complains that she noticed that her lower  extremities were quite swollen.  Then along the right  lower extremity  she had some redness.  She was seen by her orthopedist and thought to  have a possible early cellulitis and started on a 7 day course of  Keflex, which today is her last dose.  The patient was also increased on  Lasix, up to 40 mg daily.  The patient has noticed a 3 pound weight loss  and slightly decreased edema.  The patient also was set up for a venous  Doppler, which she reports was negative for DVT.  Unfortunately, those  records are unavailable.  The patient complains that she continues to  have some lower extremity swelling which is worse in the afternoon  hours.  She denies any fever, chest pain, palpitations.  The patient has  been using some non-steroidals on most days for her arthritic  complaints.   PAST MEDICAL HISTORY:  Reviewed.   CURRENT MEDICATIONS:  Reviewed.   PHYSICAL EXAMINATION:  The patient is a morbidly obese female in no  acute distress.  She is afebrile with stable vital signs.  Weight is up  8 pounds at 251.  HEENT:  Unremarkable.  NECK:  Supple without cervical adenopathy.  No JVD.  LUNGS:  Sounds are clear.  CARDIAC:  Regular rate.  ABDOMEN:  Soft, obese and nontender.  EXTREMITIES:  Warm without any calf tenderness, cyanosis, clubbing.  Pulses are intact.  There is 2+ edema bilaterally with no  significant  redness or heat noted.  Negative Homans sign.  Pulses are intact  bilaterally.   IMPRESSION AND PLAN:  1. Lower extremity edema with suspected cellulitis recently.  The      patient has now completed a 7 day course of Keflex and there      appears to be no residual cellulitis present.  The patient does      have quite a bit of venous insufficiency with edema.  She is      recommended to increase Lasix up to 80 mg along with a K-Dur tablet      over the next 3 days and then resume 40 mg daily dose.  The patient      is recommended to use TED support stockings.  She will recheck here      in one week or sooner if needed.  Labs are pending, including a      BMET and BNP.  2. Hypothyroidism.  The patient had a recent TSH that was  significantly undertreated with a TSH of 49.  The patient reports      she had skipped several doses of her thyroid medicine.      She has resumed this over the last month and needs a recheck.  TSH      is pending at time of dictation.      Jenny Oaks, NP  Electronically Signed      Jenny [name]. Jenny Basque, MD  Electronically Signed   TP/MedQ  DD: 01/26/2007  DT: 01/26/2007  Job #: [id number]
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
## 2011-02-27 NOTE — Op Note (Signed)
NAME:  Jenny [name], Jenny [name]             ACCOUNT NO.:  [id number]   MEDICAL RECORD NO.:  [id number]          PATIENT TYPE:  AMB   LOCATION:  ENDO                         FACILITY:  [facility]   PHYSICIAN:  Anselmo Rod, M.D.  DATE OF BIRTH:  1941-10-03   DATE OF PROCEDURE:  07/15/2005  DATE OF DISCHARGE:  07/15/2005                                 OPERATIVE REPORT   PROCEDURE PERFORMED:  Screening colonoscopy.   ENDOSCOPIST:  Charna Elizabeth, M.D.   INSTRUMENT USED:  Olympus video colonoscope.   INDICATIONS FOR PROCEDURE:  The patient is a 70 year old white female with a  history of left lower quadrant pain and rectal pressure radiating down her  leg, diarrhea alternating with constipation.  Rule out colonic polyps,  masses, etc.   PREPROCEDURE PREPARATION:  Informed consent was procured from the patient.  The patient was fasted for eight hours prior to the procedure and prepped  with a bottle of magnesium citrate and a gallon of GoLytely the night prior  to the procedure.  The risks and benefits of the procedure including a 10%  miss rate for polyps or cancers was discussed with the patient as well.   PREPROCEDURE PHYSICAL:  The patient had stable vital signs.  Neck supple.  Chest clear to auscultation.  S1 and S2 regular.  Abdomen obese, nontender  with normal bowel sounds.   DESCRIPTION OF PROCEDURE:  The patient was placed in left lateral decubitus  position and sedated with 80 mg of Demerol and 8 mg of Versed in slow  incremental doses.  Once the patient was adequately sedated and maintained  on low flow oxygen and continuous cardiac monitoring, the Olympus video  colonoscope was advanced from the rectum to the cecum.  The appendicular  orifice and ileocecal valve were clearly visualized and photographed.  There  was evidence of extensive sigmoid diverticulosis.  The patient had a very  tortuous colon.  Internal hemorrhoids were seen on retroflexion.  No masses  or polyps were  identified.  There was a significant amount of residual stool  in the colon.  Multiple washes were done.   IMPRESSION:  1.  Small nonbleeding internal hemorrhoids.  2.  Sigmoid diverticulosis.  3.  Very tortuous colon.  Patient's position had to be changed from the left      lateral to the supine and the right lateral position and again back to      supine and left lateral position to reach the cecal base and some      abdominal pressure was applied.   RECOMMENDATIONS:  1.  Continue high fiber diet with liberal fluid intake.  2.  Brochures on diverticulosis have been given to the patient for her      education.  3.  I do not suspect the patient's leg pain or pressure is from her GI      symptoms and therefore further evaluation from a neurologic or      orthopedic standpoint is recommended.  4.  Repeat colonoscopy is recommended in the next 10 years unless the      patient develops any  abnormal symptoms      in the interim.  5.  If the patient develops any pain, fever or change in bowel habits with      her left lower quadrant she is to contact the office immediately to be      evaluated for diverticulitis.      Anselmo Rod, M.D.  Electronically Signed     JNM/MEDQ  D:  07/16/2005  T:  07/17/2005  Job:  [id number]   cc:   Marcelino Duster L. Vincente Poli, M.D.  Fax: [phone]

## 2011-03-11 ENCOUNTER — Ambulatory Visit (INDEPENDENT_AMBULATORY_CARE_PROVIDER_SITE_OTHER): Payer: Medicare Other | Admitting: Pulmonary Disease

## 2011-03-11 ENCOUNTER — Ambulatory Visit (INDEPENDENT_AMBULATORY_CARE_PROVIDER_SITE_OTHER)
Admission: RE | Admit: 2011-03-11 | Discharge: 2011-03-11 | Disposition: A | Discharge status: Home / Self Care | Payer: Medicare Other | Source: Ambulatory Visit | Attending: Pulmonary Disease | Admitting: Pulmonary Disease

## 2011-03-11 DIAGNOSIS — M1990 Unspecified osteoarthritis, unspecified site: Secondary | ICD-10-CM

## 2011-03-11 DIAGNOSIS — J42 Unspecified chronic bronchitis: Secondary | ICD-10-CM

## 2011-03-11 DIAGNOSIS — G4733 Obstructive sleep apnea (adult) (pediatric): Secondary | ICD-10-CM

## 2011-03-11 DIAGNOSIS — M79609 Pain in unspecified limb: Secondary | ICD-10-CM

## 2011-03-11 DIAGNOSIS — J309 Allergic rhinitis, unspecified: Secondary | ICD-10-CM

## 2011-03-11 DIAGNOSIS — J45909 Unspecified asthma, uncomplicated: Secondary | ICD-10-CM

## 2011-03-11 DIAGNOSIS — E039 Hypothyroidism, unspecified: Secondary | ICD-10-CM

## 2011-03-11 DIAGNOSIS — I10 Essential (primary) hypertension: Secondary | ICD-10-CM

## 2011-03-11 MED ORDER — ALBUTEROL SULFATE HFA 108 (90 BASE) MCG/ACT IN AERS: 2.0000 | INHALATION_SPRAY | Freq: Four times a day (QID) | RESPIRATORY_TRACT | Status: DC | PRN

## 2011-03-11 MED ORDER — BUDESONIDE-FORMOTEROL FUMARATE 160-4.5 MCG/ACT IN AERO: 1.0000 | INHALATION_SPRAY | Freq: Every day | RESPIRATORY_TRACT | Status: DC

## 2011-03-11 NOTE — Patient Instructions (Signed)
Today we updated your med list in EPIC...    We refilled your Symbicort & Proair...  Remember to use the OTC Antihistamine (Allegra, Zyrtek) as needed for allergy symptoms, and the Saline nasal mist too...  Today we did your follow up CXR...    Please call the PHONE TREE in a few days for your results...    Dial [id number] & when prompted enter your patient number followed by the # symbol...    Your patient number is:  [id number]  Continue the CPAP nightly, and improvement requires diet/ exercise/ & weight reduction!!!  Call for any questions...  Let's plan a routine follow up in 1 yr, & call as needed for problems.Marland KitchenMarland Kitchen
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
## 2011-03-14 ENCOUNTER — Encounter: Payer: Self-pay | Admitting: Pulmonary Disease

## 2011-03-16 ENCOUNTER — Encounter: Payer: Self-pay | Admitting: Family Medicine

## 2011-03-16 ENCOUNTER — Ambulatory Visit (INDEPENDENT_AMBULATORY_CARE_PROVIDER_SITE_OTHER): Payer: Medicare Other | Admitting: Family Medicine

## 2011-03-16 VITALS — BP 136/87 | HR 78 | Temp 97.7°F | Ht 66.0 in | Wt 223.8 lb

## 2011-03-16 DIAGNOSIS — I10 Essential (primary) hypertension: Secondary | ICD-10-CM

## 2011-03-16 DIAGNOSIS — E559 Vitamin D deficiency, unspecified: Secondary | ICD-10-CM

## 2011-03-16 DIAGNOSIS — G629 Polyneuropathy, unspecified: Secondary | ICD-10-CM

## 2011-03-16 DIAGNOSIS — G609 Hereditary and idiopathic neuropathy, unspecified: Secondary | ICD-10-CM

## 2011-03-16 DIAGNOSIS — G4733 Obstructive sleep apnea (adult) (pediatric): Secondary | ICD-10-CM

## 2011-03-16 MED ORDER — GABAPENTIN 100 MG PO CAPS: ORAL_CAPSULE | ORAL | Status: DC

## 2011-03-16 NOTE — Patient Instructions (Signed)

## 2011-03-16 NOTE — Assessment & Plan Note (Signed)
Tolerating daily supplements, no changes today.

## 2011-03-16 NOTE — Assessment & Plan Note (Signed)
Has begun to use her CPAP again some but still does not use it every night, is encouraged to use it as much as possible, she did just have her annual visit with pulmonology

## 2011-03-16 NOTE — Assessment & Plan Note (Signed)
Well controlled no change in meds today 

## 2011-03-16 NOTE — Assessment & Plan Note (Signed)
Patient struggles with multiple chronic pain issues and is presently seeing Rheumatology to have her pain managed. She is encouraged to ask Rheumatology if they think a Neurologic consultation may be helpful especially for the intense pain she has in her LUE. For now she agrees to restart the Gabapentin. At last visit she started Gabapentin and tolerated the 100 and 200 mg doses but without any notable improvement. The 300mg  led to some increased sedation. She will restart at 100mg  and titrate up as tolerated more slowly to 300mg 

## 2011-03-16 NOTE — Progress Notes (Signed)
Jenny [name] [id number] Jan 05, 1941 03/16/2011      Progress Note-Follow Up  Subjective  Chief Complaint  Chief Complaint  Patient presents with  . Follow-up    1 month follow up    HPI  Patient is a 70 year old Caucasian female in today for followup. Overall she reports she is doing well. She's had no recent illness, fevers, chills, chest pain, shortness of breath, palpitations, GI or GU complaints. She continues to struggle with significant pain in her left upper extremity. Describes as an aching and a shooting type pain. Is following with Dr. Thayer Jew of Rheumatology but so far they have not been able to control her pain. They tried tramadol recently but it caused pruritus and she stopped it. She also did not note it helped. In the past she has seen Dr. Faustino [facility] and he had been discussed that they might do some PND states that that has not been done to date. She is declining the shingles vaccine at this time due to the cost. She denies any difficulties with heartburn or skin lesions at present.  Past Medical History  Diagnosis Date  . Allergy     rhinitis  . Obstructive sleep apnea   . Bronchitis   . Hypertension   . Chest pain   . Venous insufficiency   . Diabetes mellitus     borderline  . Obesity   . Hypothyroidism   . GERD (gastroesophageal reflux disease)   . Diverticulosis of colon   . Urinary incontinence   . Degenerative joint disease   . Lumbar back pain   . Vitamin D deficiency   . Dysthymia   . Asthma     mild, intermittent  . Transient memory loss March 2006    after a stressful event she had no memory the rest of that day  . VITAMIN D DEFICIENCY 08/21/2008  . VENOUS INSUFFICIENCY 08/23/2007  . SLEEP APNEA, OBSTRUCTIVE 08/23/2007  . Other urinary incontinence 03/12/2010  . OBESITY 08/23/2007  . HYPOTHYROIDISM 08/23/2007  . HYPERTENSION 02/22/2008  . HAND PAIN 04/09/2010  . GERD 08/23/2007  . 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  . DIVERTICULOSIS OF COLON  02/22/2008  . DIABETES MELLITUS, BORDERLINE 08/21/2008  . DEGENERATIVE JOINT DISEASE 02/22/2008  . BRONCHITIS, RECURRENT 02/22/2008  . BACK PAIN, LUMBAR 08/23/2007  . ALLERGIC RHINITIS 08/23/2007  . Asthma 01/14/2011  . 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 History of chicken pox 01/14/2011  . History of measles 01/14/2011  . Peripheral neuropathy 02/12/2011    Past Surgical History  Procedure Date  . Appendectomy   . Tonsillectomy and adenoidectomy   . Ap repair and sling for cystocele 8-04     Dr Patsi Sears  . Left hand 2010    left thumb, joint removal, [facility]  . Ctr     right wrist  . Shoulder arthroscopy distal clavicle excision and open rotator cuff repair     right  . Neck surgery     discectomy  . Abdominal hysterectomy     took one ovary  . Cholecystectomy     Family History  Problem Relation Age of Onset  . COPD Mother   . Pneumonia Mother   . Other Mother     CHF  . Hypertension Mother   . Arthritis Mother     septic knee after replacement  . Heart disease Mother     chf  . Dementia Father   . Arthritis Brother   . Asthma Daughter   . Cancer  Maternal Aunt     breast  . Stroke Maternal Grandmother   . Heart disease Maternal Grandmother   . Other Maternal Grandfather     CHF  . Heart disease Maternal Grandfather     chf  . Cancer Paternal Grandmother     stomach?  . Other Paternal Grandfather     problems with kidneys  . Kidney disease Paternal Grandfather   . Anxiety disorder Daughter   . Cancer Maternal Aunt     liver    History   Social History  . Marital Status: Married    Spouse Name: N/A    Number of Children: N/A  . Years of Education: N/A   Occupational History  . Not on file.   Social History Main Topics  . Smoking status: Former Smoker -- 1.5 packs/day for 30 years    Types: Cigarettes    Quit date: 10/13/1991  . Smokeless tobacco: Never Used  . Alcohol Use: No  . Drug Use: No  . Sexually Active: No   Other Topics Concern  . Not  on file   Social History Narrative  . No narrative on file    Current Outpatient Prescriptions on File Prior to Visit  Medication Sig Dispense Refill  . albuterol (PROAIR HFA) 108 (90 BASE) MCG/ACT inhaler Inhale 2 puffs into the lungs every 6 (six) hours as needed. For wheezing  1 Inhaler  11  . ALPRAZolam (XANAX) 0.5 MG tablet Take 0.5 mg by mouth 3 (three) times daily as needed.        . benzonatate (TESSALON PERLES) 100 MG capsule Take 1 capsule (100 mg total) by mouth 3 (three) times daily as needed for cough.  30 capsule  0  . budesonide-formoterol (SYMBICORT) 160-4.5 MCG/ACT inhaler Inhale 1 puff into the lungs daily.  1 Inhaler  11  . calcium carbonate (OS-CAL) 600 MG TABS Take 600 mg by mouth 2 (two) times daily with a meal.        . Cholecalciferol (VITAMIN D) 1000 UNITS capsule Take 1,000 Units by mouth daily.        . clotrimazole-betamethasone (LOTRISONE) cream Apply topically 2 (two) times daily.  45 g  1  . famotidine (PEPCID) 10 MG tablet Take 10 mg by mouth 2 (two) times daily as needed.        . furosemide (LASIX) 40 MG tablet Take 1 tablet (40 mg total) by mouth daily as needed.  30 tablet  5  . guaiFENesin (MUCINEX) 600 MG 12 hr tablet Take 1,200 mg by mouth 2 (two) times daily.        Marland Kitchen levothyroxine (SYNTHROID, LEVOTHROID) 125 MCG tablet Take 1 tablet (125 mcg total) by mouth daily.  30 tablet  1  . potassium chloride SA (K-DUR,KLOR-CON) 20 MEQ tablet Take 1 tablet (20 mEq total) by mouth daily as needed. With lasix  30 tablet  2  . DISCONTD: Gabapentin (NEURONTIN PO) Take by mouth as needed.          Allergies  Allergen Reactions  . Codeine     REACTION: nausea  . Prednisone     "I forget who I am"    Review of Systems  Review of Systems  Constitutional: Negative for fever and malaise/fatigue.  HENT: Negative for congestion.   Eyes: Negative for discharge.  Respiratory: Negative for shortness of breath.   Cardiovascular: Negative for chest pain,  palpitations and leg swelling.  Gastrointestinal: Negative for nausea, abdominal pain and diarrhea.  Genitourinary: Negative for dysuria.  Musculoskeletal: Negative for falls.  Skin: Negative for rash.  Neurological: Negative for loss of consciousness and headaches.  Endo/Heme/Allergies: Negative for polydipsia.  Psychiatric/Behavioral: Negative for depression and suicidal ideas. The patient is not nervous/anxious and does not have insomnia.     Objective  BP 136/87  Pulse 78  Temp(Src) 97.7 F (36.5 C) (Oral)  Ht 5\' 6"  (1.676 m)  Wt 223 lb 12.8 oz (101.515 kg)  BMI 36.12 kg/m2  SpO2 96%  Physical Exam  Physical Exam  Constitutional: She is oriented to person, place, and time and well-developed, well-nourished, and in no distress. No distress.  HENT:  Head: Normocephalic and atraumatic.  Eyes: Conjunctivae are normal.  Neck: Neck supple. No thyromegaly present.  Cardiovascular: Normal rate, regular rhythm and normal heart sounds.   No murmur heard. Pulmonary/Chest: Effort normal and breath sounds normal. She has no wheezes.  Abdominal: She exhibits no distension and no mass.  Musculoskeletal: She exhibits no edema.  Lymphadenopathy:    She has no cervical adenopathy.  Neurological: She is alert and oriented to person, place, and time.  Skin: Skin is warm and dry. No rash noted. She is not diaphoretic.  Psychiatric: Memory, affect and judgment normal.    Lab Results  Component Value Date   TSH 0.07* 01/14/2011   Lab Results  Component Value Date   WBC 7.8 01/14/2011   HGB 13.9 01/14/2011   HCT 41.3 01/14/2011   MCV 89.4 01/14/2011   PLT 235.0 01/14/2011   Lab Results  Component Value Date   CREATININE 0.9 01/14/2011   BUN 14 01/14/2011   NA 144 01/14/2011   K 5.1 01/14/2011   CL 105 01/14/2011   CO2 31 01/14/2011   Lab Results  Component Value Date   ALT 16 01/14/2011   AST 20 01/14/2011   ALKPHOS 84 01/14/2011   BILITOT 0.8 01/14/2011   Lab Results  Component Value Date   CHOL  132 01/14/2011   Lab Results  Component Value Date   HDL 39.70 01/14/2011   Lab Results  Component Value Date   LDLCALC 76 01/14/2011   Lab Results  Component Value Date   TRIG 84.0 01/14/2011   Lab Results  Component Value Date   CHOLHDL 3 01/14/2011     Assessment & Plan  Vitamin D deficiency Tolerating daily supplements, no changes today.  SLEEP APNEA, OBSTRUCTIVE Has begun to use her CPAP again some but still does not use it every night, is encouraged to use it as much as possible, she did just have her annual visit with pulmonology  Peripheral neuropathy Patient struggles with multiple chronic pain issues and is presently seeing Rheumatology to have her pain managed. She is encouraged to ask Rheumatology if they think a Neurologic consultation may be helpful especially for the intense pain she has in her LUE. For now she agrees to restart the Gabapentin. At last visit she started Gabapentin and tolerated the 100 and 200 mg doses but without any notable improvement. The 300mg  led to some increased sedation. She will restart at 100mg  and titrate up as tolerated more slowly to 300mg   HYPERTENSION Well controlled no change in meds today

## 2011-04-01 ENCOUNTER — Telehealth: Payer: Self-pay

## 2011-04-01 NOTE — Telephone Encounter (Signed)
She is not presently on Sertraline, will try Citalopram 10 mg po daily and she should come in in 3-4 weeks to discuss her response, sooner as needed. Side effects are not common but gi upset such as nausea and diarrhea can occur and tend to get better after the first 1-2 weeks. Can cause insomnia or sedation but usually neither one. Take at night if it makes her sleepy and take in am if wakes her up. If it makes her feel worse, stop it and come in, Disp, #30, norf

## 2011-04-01 NOTE — Telephone Encounter (Signed)
Please advise 

## 2011-04-01 NOTE — Telephone Encounter (Signed)
Pt states her mother died 6 months ago and can't "shake" the depression

## 2011-04-01 NOTE — Telephone Encounter (Signed)
Patient should titrate off her Sertraline 50mg  again, take a 1/2 tab po daily for 1 week while she is taking the Cymbalta at 30mg  and then stop the Sertraline altogether at the end of that week

## 2011-04-02 MED ORDER — CITALOPRAM HYDROBROMIDE 10 MG PO TABS: 10.0000 mg | ORAL_TABLET | Freq: Every day | ORAL | Status: DC

## 2011-04-13 ENCOUNTER — Other Ambulatory Visit (HOSPITAL_BASED_OUTPATIENT_CLINIC_OR_DEPARTMENT_OTHER): Payer: Self-pay | Admitting: Rheumatology

## 2011-04-13 DIAGNOSIS — M549 Dorsalgia, unspecified: Secondary | ICD-10-CM

## 2011-04-18 ENCOUNTER — Ambulatory Visit (HOSPITAL_BASED_OUTPATIENT_CLINIC_OR_DEPARTMENT_OTHER)
Admission: RE | Admit: 2011-04-18 | Discharge: 2011-04-18 | Disposition: A | Discharge status: Home / Self Care | Payer: Medicare Other | Source: Ambulatory Visit | Attending: Rheumatology | Admitting: Rheumatology

## 2011-04-18 DIAGNOSIS — M5137 Other intervertebral disc degeneration, lumbosacral region: Secondary | ICD-10-CM

## 2011-04-18 DIAGNOSIS — R209 Unspecified disturbances of skin sensation: Secondary | ICD-10-CM | POA: Insufficient documentation

## 2011-04-18 DIAGNOSIS — M5126 Other intervertebral disc displacement, lumbar region: Secondary | ICD-10-CM | POA: Insufficient documentation

## 2011-04-18 DIAGNOSIS — M549 Dorsalgia, unspecified: Secondary | ICD-10-CM

## 2011-04-18 DIAGNOSIS — M545 Low back pain, unspecified: Secondary | ICD-10-CM | POA: Insufficient documentation

## 2011-04-18 DIAGNOSIS — M51379 Other intervertebral disc degeneration, lumbosacral region without mention of lumbar back pain or lower extremity pain: Secondary | ICD-10-CM | POA: Insufficient documentation

## 2011-04-18 DIAGNOSIS — M47817 Spondylosis without myelopathy or radiculopathy, lumbosacral region: Secondary | ICD-10-CM | POA: Insufficient documentation

## 2011-04-27 ENCOUNTER — Other Ambulatory Visit: Payer: Self-pay | Admitting: Neurosurgery

## 2011-04-27 DIAGNOSIS — M542 Cervicalgia: Secondary | ICD-10-CM

## 2011-04-28 ENCOUNTER — Ambulatory Visit
Admission: RE | Admit: 2011-04-28 | Discharge: 2011-04-28 | Disposition: A | Discharge status: Home / Self Care | Payer: Medicare Other | Source: Ambulatory Visit | Attending: Neurosurgery | Admitting: Neurosurgery

## 2011-04-28 DIAGNOSIS — M542 Cervicalgia: Secondary | ICD-10-CM

## 2011-04-30 ENCOUNTER — Encounter (HOSPITAL_COMMUNITY)
Admission: RE | Admit: 2011-04-30 | Discharge: 2011-04-30 | Disposition: A | Discharge status: Home / Self Care | Payer: Medicare Other | Source: Ambulatory Visit | Attending: Neurosurgery | Admitting: Neurosurgery

## 2011-04-30 LAB — CBC
HCT: 43.4 % (ref 36.0–46.0)
Hemoglobin: 14.2 g/dL (ref 12.0–15.0)
MCH: 29.7 pg (ref 26.0–34.0)
MCHC: 32.7 g/dL (ref 30.0–36.0)
MCV: 90.8 fL (ref 78.0–100.0)
Platelets: 286 10*3/uL (ref 150–400)
RBC: 4.78 MIL/uL (ref 3.87–5.11)
RDW: 13 % (ref 11.5–15.5)
WBC: 11.3 10*3/uL — ABNORMAL HIGH (ref 4.0–10.5)

## 2011-04-30 LAB — SURGICAL PCR SCREEN
MRSA, PCR: NEGATIVE
Staphylococcus aureus: NEGATIVE

## 2011-04-30 LAB — BASIC METABOLIC PANEL
BUN: 18 mg/dL (ref 6–23)
CO2: 29 mEq/L (ref 19–32)
Calcium: 9.6 mg/dL (ref 8.4–10.5)
Chloride: 102 mEq/L (ref 96–112)
Creatinine, Ser: 0.76 mg/dL (ref 0.50–1.10)
GFR calc Af Amer: >60 mL/min (ref >60)
GFR calc non Af Amer: >60 mL/min (ref >60)
Glucose, Bld: 68 mg/dL — ABNORMAL LOW (ref 70–99)
Potassium: 5.4 meq/L — ABNORMAL HIGH (ref 3.5–5.1)
Sodium: 139 meq/L (ref 135–145)

## 2011-05-01 ENCOUNTER — Inpatient Hospital Stay (HOSPITAL_COMMUNITY): Admission: RE | Admit: 2011-05-01 | Payer: Medicare Other | Source: Ambulatory Visit | Admitting: Neurosurgery

## 2011-05-05 ENCOUNTER — Inpatient Hospital Stay (HOSPITAL_COMMUNITY): Payer: Medicare Other

## 2011-05-05 ENCOUNTER — Inpatient Hospital Stay (HOSPITAL_COMMUNITY)
Admission: RE | Admit: 2011-05-05 | Discharge: 2011-05-08 | DRG: 473 | Disposition: A | Discharge status: Home / Self Care | Payer: Medicare Other | Source: Ambulatory Visit | Attending: Neurosurgery | Admitting: Neurosurgery

## 2011-05-05 DIAGNOSIS — G4733 Obstructive sleep apnea (adult) (pediatric): Secondary | ICD-10-CM | POA: Diagnosis present

## 2011-05-05 DIAGNOSIS — Z981 Arthrodesis status: Secondary | ICD-10-CM

## 2011-05-05 DIAGNOSIS — M4712 Other spondylosis with myelopathy, cervical region: Principal | ICD-10-CM | POA: Diagnosis present

## 2011-05-05 DIAGNOSIS — Z01812 Encounter for preprocedural laboratory examination: Secondary | ICD-10-CM

## 2011-05-05 DIAGNOSIS — E669 Obesity, unspecified: Secondary | ICD-10-CM | POA: Diagnosis present

## 2011-05-05 DIAGNOSIS — K219 Gastro-esophageal reflux disease without esophagitis: Secondary | ICD-10-CM | POA: Diagnosis present

## 2011-05-05 DIAGNOSIS — J45909 Unspecified asthma, uncomplicated: Secondary | ICD-10-CM | POA: Diagnosis present

## 2011-05-05 NOTE — Op Note (Signed)
  Jenny [name], Jenny [name]             ACCOUNT NO.:  [id number]  MEDICAL RECORD NO.:  [id number]  LOCATION:  3172                         FACILITY:  [facility]  PHYSICIAN:  Hilda Lias, M.D.   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  PROCEDURE:  Anterior C3-C4, C4-C5 diskectomy, decompression of the spinal cord and the C4-C5 nerve root, interbody fusion with auto and allograft plate microscope.  SURGEON:  Hilda Lias, MD.  ASSISTANT:  Hewitt Shorts, MD.  CLINICAL HISTORY:  [name] is a lady who was seen in my office, complaining of back pain, worsened to both legs.  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  Nevertheless, later she had been complaining of a tingling sensation in both hands and problems with balance.  An MRI showed that she has severe stenosis at the level of C3- 4, C4-5 with change in the spinal cord itself.  Previously, this lady has had anterior cervical diskectomy by me many years ago.  Because of that, we cancelled the lumbar procedure and we go ahead with the cervical one.  DESCRIPTION OF PROCEDURE:  The patient was taken to the OR, and after intubation, the left side of the neck was cleaned with DuraPrep.  The patient has really short neck.  She is overweight.  Incision was made through the skin, subcutaneous tissue, and through a thick adipose tissue and straight down to the cervical spine.  Dissection was carried down and the x-rays showed that we were right at the level of C4-5.  The patient had anterior osteophyte, which was removed.  With the microscope, we opened the anterior  ligament at the level of C3-4 and C4- 5.  We started working our way at the level of C4-5 with removal of facet.  We drilled the posterior aspect of C4-5 and the posterior ligament was opened.  A herniated disk central at the right was removed. At the end, we had good decompression of the spinal cord as well C4-C5 nerve root.  At the level of C3-C4, we found mostly degenerative disk disease with quite a bit of compromising the spinal cord.  Decompression was achieved.  Then, the endplate were drilled and two piece of allograft, lordotic, 7 mm height, with autograft and PBX were inserted. A plate using six screws from C3 down to C5 was used.  Lateral cervical spine showed good position of the graft and the plate.  The area was irrigated and hemostasis was accomplished, and then once we had good hemostasis, the wound was closed with Vicryl and Steri-Strips.          ______________________________ Hilda Lias, M.D.     EB/MEDQ  D:  05/05/2011  T:  05/05/2011  Job:  [id number]  Electronically Signed by Hilda Lias M.D. on 05/05/2011 02:06:43 PM

## 2011-05-12 NOTE — Discharge Summary (Signed)
  Jenny [name], Jenny [name]             ACCOUNT NO.:  [id number]  MEDICAL RECORD NO.:  [id number]  LOCATION:  3012                         FACILITY:  [facility]  PHYSICIAN:  Hilda Lias, M.D.   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  CLINICAL HISTORY:  The patient was brought to the hospital because of neck pain with radiation to the upper extremities.  She has a history of lumbar disease.  She wants to have a lumbar surgery.  We did MRI of the cervical spine which showed severe stenosis with gliosis at the level of C3-C4 and C4-C5.  Surgery was advised.  Laboratory normal.  COURSE IN THE HOSPITAL:  The patient was taken to Surgery. Decompression and fusion of C3-C4, C4-C5 was done.  Today, she is doing much better.  She is eating, she is ambulating, and she is ready to go home.  CONDITION ON DISCHARGE:  Improvement.  MEDICATIONS:  Percocet, diazepam.  DIET:  Regular.  ACTIVITY:  Not to drive for at least a week.  FOLLOWUP:  She will be seen by me in 4 weeks.  It will be at least 2 months before we do any intervention of the lumbar spine.          ______________________________ Hilda Lias, M.D.     EB/MEDQ  D:  05/08/2011  T:  05/08/2011  Job:  [id number]  Electronically Signed by Hilda Lias M.D. on 05/12/2011 03:33:44 PM

## 2011-08-13 ENCOUNTER — Encounter (HOSPITAL_COMMUNITY): Payer: Self-pay | Admitting: Pharmacy Technician

## 2011-08-14 ENCOUNTER — Encounter (HOSPITAL_COMMUNITY): Payer: Self-pay

## 2011-08-14 ENCOUNTER — Encounter (HOSPITAL_COMMUNITY)
Admission: RE | Admit: 2011-08-14 | Discharge: 2011-08-14 | Disposition: A | Discharge status: Home / Self Care | Payer: Medicare Other | Source: Ambulatory Visit | Attending: Neurosurgery | Admitting: Neurosurgery

## 2011-08-14 HISTORY — DX: Other specified postprocedural states: Z98.890

## 2011-08-14 HISTORY — DX: Cough, unspecified: R05.9

## 2011-08-14 HISTORY — DX: Frequency of micturition: R35.0

## 2011-08-14 HISTORY — DX: Other specified postprocedural states: R11.2

## 2011-08-14 HISTORY — DX: Unspecified urinary incontinence: R32

## 2011-08-14 HISTORY — DX: Cough: R05

## 2011-08-14 LAB — BASIC METABOLIC PANEL
BUN: 14 mg/dL (ref 6–23)
CO2: 28 mEq/L (ref 19–32)
Calcium: 9.9 mg/dL (ref 8.4–10.5)
Chloride: 103 meq/L (ref 96–112)
Creatinine, Ser: 0.94 mg/dL (ref 0.50–1.10)
GFR calc Af Amer: 69 mL/min — ABNORMAL LOW (ref >90)
GFR calc non Af Amer: 59 mL/min — ABNORMAL LOW (ref >90)
Glucose, Bld: 105 mg/dL — ABNORMAL HIGH (ref 70–99)
Potassium: 5.3 meq/L — ABNORMAL HIGH (ref 3.5–5.1)
Sodium: 139 meq/L (ref 135–145)

## 2011-08-14 LAB — CBC
HCT: 43.5 % (ref 36.0–46.0)
Hemoglobin: 14.4 g/dL (ref 12.0–15.0)
MCH: 30.1 pg (ref 26.0–34.0)
MCHC: 33.1 g/dL (ref 30.0–36.0)
MCV: 90.8 fL (ref 78.0–100.0)
Platelets: 261 10*3/uL (ref 150–400)
RBC: 4.79 MIL/uL (ref 3.87–5.11)
RDW: 13.5 % (ref 11.5–15.5)
WBC: 8.5 10*3/uL (ref 4.0–10.5)

## 2011-08-14 LAB — SURGICAL PCR SCREEN
MRSA, PCR: NEGATIVE
Staphylococcus aureus: NEGATIVE

## 2011-08-14 NOTE — Pre-Procedure Instructions (Addendum)
20 Jenny [name]  08/14/2011   Your procedure is scheduled on:  November 7  Report to [facility] at 6:30 AM.  Call this number if you have problems the morning of surgery: [phone]   Remember:   Do not eat food:After Midnight.  Do not drink clear liquids: 4 Hours before arrival.  Take these medicines the morning of surgery with A SIP OF WATER: albuterol inhaler (bring with you to surgery), valium if needed, tessalon if needed, symbicort, flexeril if needed, pepcid, pain pill if needed, synthroid   Do not wear jewelry, make-up or nail polish.  Do not wear lotions, powders, or perfumes. You may wear deodorant.  Do not shave 48 hours prior to surgery.  Do not bring valuables to the hospital.  Contacts, dentures or bridgework may not be worn into surgery.  Leave suitcase in the car. After surgery it may be brought to your room.  For patients admitted to the hospital, checkout time is 11:00 AM the day of discharge.   Patients discharged the day of surgery will not be allowed to drive home.  Name and phone number of your driver: to be admitted  Special Instructions: CHG Shower Use Special Wash: 1/2 bottle night before surgery and 1/2 bottle morning of surgery.   Please read over the following fact sheets that you were given: Pain Booklet, Coughing and Deep Breathing and Surgical Site Infection Prevention

## 2011-08-18 ENCOUNTER — Encounter (HOSPITAL_COMMUNITY): Payer: Self-pay

## 2011-08-18 MED ORDER — CEFAZOLIN SODIUM 1-5 GM-% IV SOLN
1.0000 g | INTRAVENOUS | Status: AC
  Administered 2011-08-19: 2 g via INTRAVENOUS
  Filled 2011-08-18: qty 50

## 2011-08-19 ENCOUNTER — Inpatient Hospital Stay (HOSPITAL_COMMUNITY): Payer: Medicare Other

## 2011-08-19 ENCOUNTER — Encounter (HOSPITAL_COMMUNITY): Payer: Self-pay | Admitting: Anesthesiology

## 2011-08-19 ENCOUNTER — Encounter (HOSPITAL_COMMUNITY)
Admission: RE | Disposition: A | Discharge status: Rehabilitation Facility | Payer: Self-pay | Source: Ambulatory Visit | Attending: Neurosurgery

## 2011-08-19 ENCOUNTER — Encounter (HOSPITAL_COMMUNITY): Payer: Self-pay | Admitting: Certified Registered"

## 2011-08-19 ENCOUNTER — Encounter (HOSPITAL_COMMUNITY): Payer: Self-pay | Admitting: *Deleted

## 2011-08-19 ENCOUNTER — Inpatient Hospital Stay (HOSPITAL_COMMUNITY): Payer: Medicare Other | Admitting: Anesthesiology

## 2011-08-19 ENCOUNTER — Inpatient Hospital Stay (HOSPITAL_COMMUNITY)
Admission: RE | Admit: 2011-08-19 | Discharge: 2011-08-24 | DRG: 460 | Disposition: A | Discharge status: Rehabilitation Facility | Payer: Medicare Other | Source: Ambulatory Visit | Attending: Neurosurgery | Admitting: Neurosurgery

## 2011-08-19 DIAGNOSIS — E669 Obesity, unspecified: Secondary | ICD-10-CM | POA: Diagnosis present

## 2011-08-19 DIAGNOSIS — I872 Venous insufficiency (chronic) (peripheral): Secondary | ICD-10-CM | POA: Diagnosis present

## 2011-08-19 DIAGNOSIS — E559 Vitamin D deficiency, unspecified: Secondary | ICD-10-CM | POA: Diagnosis present

## 2011-08-19 DIAGNOSIS — M545 Low back pain, unspecified: Secondary | ICD-10-CM | POA: Diagnosis present

## 2011-08-19 DIAGNOSIS — R7309 Other abnormal glucose: Secondary | ICD-10-CM | POA: Diagnosis present

## 2011-08-19 DIAGNOSIS — Z23 Encounter for immunization: Secondary | ICD-10-CM

## 2011-08-19 DIAGNOSIS — M51379 Other intervertebral disc degeneration, lumbosacral region without mention of lumbar back pain or lower extremity pain: Secondary | ICD-10-CM | POA: Diagnosis present

## 2011-08-19 DIAGNOSIS — M79609 Pain in unspecified limb: Secondary | ICD-10-CM | POA: Diagnosis present

## 2011-08-19 DIAGNOSIS — K219 Gastro-esophageal reflux disease without esophagitis: Secondary | ICD-10-CM | POA: Diagnosis present

## 2011-08-19 DIAGNOSIS — J309 Allergic rhinitis, unspecified: Secondary | ICD-10-CM | POA: Diagnosis present

## 2011-08-19 DIAGNOSIS — R059 Cough, unspecified: Secondary | ICD-10-CM | POA: Diagnosis present

## 2011-08-19 DIAGNOSIS — F341 Dysthymic disorder: Secondary | ICD-10-CM | POA: Diagnosis present

## 2011-08-19 DIAGNOSIS — I10 Essential (primary) hypertension: Secondary | ICD-10-CM | POA: Diagnosis present

## 2011-08-19 DIAGNOSIS — R32 Unspecified urinary incontinence: Secondary | ICD-10-CM | POA: Diagnosis present

## 2011-08-19 DIAGNOSIS — K5730 Diverticulosis of large intestine without perforation or abscess without bleeding: Secondary | ICD-10-CM | POA: Diagnosis present

## 2011-08-19 DIAGNOSIS — E039 Hypothyroidism, unspecified: Secondary | ICD-10-CM | POA: Diagnosis present

## 2011-08-19 DIAGNOSIS — M5137 Other intervertebral disc degeneration, lumbosacral region: Secondary | ICD-10-CM | POA: Diagnosis present

## 2011-08-19 DIAGNOSIS — J42 Unspecified chronic bronchitis: Secondary | ICD-10-CM

## 2011-08-19 DIAGNOSIS — Z01812 Encounter for preprocedural laboratory examination: Secondary | ICD-10-CM

## 2011-08-19 DIAGNOSIS — M4310 Spondylolisthesis, site unspecified: Principal | ICD-10-CM | POA: Diagnosis present

## 2011-08-19 DIAGNOSIS — R05 Cough: Secondary | ICD-10-CM | POA: Diagnosis present

## 2011-08-19 DIAGNOSIS — J40 Bronchitis, not specified as acute or chronic: Secondary | ICD-10-CM | POA: Diagnosis present

## 2011-08-19 DIAGNOSIS — G4733 Obstructive sleep apnea (adult) (pediatric): Secondary | ICD-10-CM | POA: Diagnosis present

## 2011-08-19 LAB — TYPE AND SCREEN
ABO/RH(D): O POS
Antibody Screen: NEGATIVE

## 2011-08-19 LAB — ABO/RH: ABO/RH(D): O POS

## 2011-08-19 SURGERY — POSTERIOR LUMBAR FUSION 2 LEVEL
Anesthesia: General

## 2011-08-19 MED ORDER — NEOSTIGMINE METHYLSULFATE 1 MG/ML IJ SOLN
INTRAMUSCULAR | Status: DC | PRN
  Administered 2011-08-19: 5 mg via INTRAVENOUS

## 2011-08-19 MED ORDER — SODIUM CHLORIDE 0.9 % IV SOLN: 250.0000 mL | INTRAVENOUS | Status: DC

## 2011-08-19 MED ORDER — SODIUM CHLORIDE 0.9 % IJ SOLN: 9.0000 mL | INTRAMUSCULAR | Status: DC | PRN

## 2011-08-19 MED ORDER — SODIUM CHLORIDE 0.9 % IJ SOLN
3.0000 mL | Freq: Two times a day (BID) | INTRAMUSCULAR | Status: DC
  Administered 2011-08-19 – 2011-08-20 (×3): 3 mL via INTRAVENOUS

## 2011-08-19 MED ORDER — MORPHINE SULFATE 2 MG/ML IJ SOLN: 0.0500 mg/kg | INTRAMUSCULAR | Status: DC | PRN

## 2011-08-19 MED ORDER — PHENOL 1.4 % MT LIQD: 1.0000 | OROMUCOSAL | Status: DC | PRN

## 2011-08-19 MED ORDER — BUPIVACAINE LIPOSOME 1.3 % IJ SUSP
20.0000 mL | Freq: Once | INTRAMUSCULAR | Status: DC
  Filled 2011-08-19: qty 20

## 2011-08-19 MED ORDER — ONDANSETRON HCL 4 MG/2ML IJ SOLN: 4.0000 mg | Freq: Four times a day (QID) | INTRAMUSCULAR | Status: DC | PRN

## 2011-08-19 MED ORDER — ALBUTEROL SULFATE HFA 108 (90 BASE) MCG/ACT IN AERS
2.0000 | INHALATION_SPRAY | Freq: Four times a day (QID) | RESPIRATORY_TRACT | Status: DC | PRN
Start: 2011-08-19 — End: 2011-08-24
  Administered 2011-08-21: 2 via RESPIRATORY_TRACT
  Filled 2011-08-19: qty 6.7

## 2011-08-19 MED ORDER — ACETAMINOPHEN 650 MG RE SUPP: 650.0000 mg | RECTAL | Status: DC | PRN

## 2011-08-19 MED ORDER — BENZONATATE 100 MG PO CAPS
100.0000 mg | ORAL_CAPSULE | Freq: Three times a day (TID) | ORAL | Status: DC | PRN
  Filled 2011-08-19: qty 1

## 2011-08-19 MED ORDER — INFLUENZA VIRUS VACC SPLIT PF IM SUSP
0.5000 mL | INTRAMUSCULAR | Status: AC
  Administered 2011-08-20: 0.5 mL via INTRAMUSCULAR
  Filled 2011-08-19: qty 0.5

## 2011-08-19 MED ORDER — FAMOTIDINE 20 MG PO TABS
10.0000 mg | ORAL_TABLET | Freq: Every day | ORAL | Status: DC
  Administered 2011-08-19: 10 mg via ORAL
  Filled 2011-08-19 (×4): qty 0.5

## 2011-08-19 MED ORDER — MIDAZOLAM HCL 5 MG/5ML IJ SOLN
INTRAMUSCULAR | Status: DC | PRN
  Administered 2011-08-19: 1 mg via INTRAVENOUS

## 2011-08-19 MED ORDER — HYDROMORPHONE HCL PF 1 MG/ML IJ SOLN: 0.2500 mg | INTRAMUSCULAR | Status: DC | PRN

## 2011-08-19 MED ORDER — LEVOTHYROXINE SODIUM 125 MCG PO TABS
125.0000 ug | ORAL_TABLET | Freq: Every day | ORAL | Status: DC
  Administered 2011-08-20 – 2011-08-24 (×5): 125 ug via ORAL
  Filled 2011-08-19 (×6): qty 1

## 2011-08-19 MED ORDER — DIAZEPAM 5 MG PO TABS
5.0000 mg | ORAL_TABLET | Freq: Four times a day (QID) | ORAL | Status: DC | PRN
  Administered 2011-08-19 – 2011-08-23 (×5): 5 mg via ORAL
  Filled 2011-08-19 (×5): qty 1

## 2011-08-19 MED ORDER — DIPHENHYDRAMINE HCL 12.5 MG/5ML PO ELIX: 12.5000 mg | ORAL_SOLUTION | Freq: Four times a day (QID) | ORAL | Status: DC | PRN

## 2011-08-19 MED ORDER — ONDANSETRON HCL 4 MG/2ML IJ SOLN
INTRAMUSCULAR | Status: DC | PRN
  Administered 2011-08-19: 4 mg via INTRAVENOUS

## 2011-08-19 MED ORDER — PROPOFOL 10 MG/ML IV EMUL
INTRAVENOUS | Status: DC | PRN
  Administered 2011-08-19: 180 mg via INTRAVENOUS

## 2011-08-19 MED ORDER — ROCURONIUM BROMIDE 100 MG/10ML IV SOLN
INTRAVENOUS | Status: DC | PRN
  Administered 2011-08-19: 10 mg via INTRAVENOUS
  Administered 2011-08-19: 50 mg via INTRAVENOUS

## 2011-08-19 MED ORDER — FUROSEMIDE 40 MG PO TABS
40.0000 mg | ORAL_TABLET | Freq: Every day | ORAL | Status: DC
  Administered 2011-08-20 – 2011-08-24 (×4): 40 mg via ORAL
  Filled 2011-08-19 (×5): qty 1

## 2011-08-19 MED ORDER — ONDANSETRON HCL 4 MG/2ML IJ SOLN: 4.0000 mg | INTRAMUSCULAR | Status: DC | PRN

## 2011-08-19 MED ORDER — NALOXONE HCL 0.4 MG/ML IJ SOLN: 0.4000 mg | INTRAMUSCULAR | Status: DC | PRN

## 2011-08-19 MED ORDER — THROMBIN 20000 UNITS EX KIT
PACK | CUTANEOUS | Status: DC | PRN
  Administered 2011-08-19: 10:00:00 via TOPICAL

## 2011-08-19 MED ORDER — DOCUSATE SODIUM 100 MG PO CAPS
100.0000 mg | ORAL_CAPSULE | Freq: Two times a day (BID) | ORAL | Status: DC
  Administered 2011-08-19 – 2011-08-24 (×10): 100 mg via ORAL
  Filled 2011-08-19 (×10): qty 1

## 2011-08-19 MED ORDER — CEFAZOLIN SODIUM 1-5 GM-% IV SOLN: 1.0000 g | INTRAVENOUS | Status: DC

## 2011-08-19 MED ORDER — DIAZEPAM 5 MG PO TABS: 5.0000 mg | ORAL_TABLET | Freq: Four times a day (QID) | ORAL | Status: DC | PRN

## 2011-08-19 MED ORDER — EPHEDRINE SULFATE 50 MG/ML IJ SOLN
INTRAMUSCULAR | Status: DC | PRN
  Administered 2011-08-19 (×3): 10 mg via INTRAVENOUS

## 2011-08-19 MED ORDER — HETASTARCH-ELECTROLYTES 6 % IV SOLN
INTRAVENOUS | Status: DC | PRN
  Administered 2011-08-19: 11:00:00 via INTRAVENOUS

## 2011-08-19 MED ORDER — MORPHINE SULFATE (PF) 1 MG/ML IV SOLN
INTRAVENOUS | Status: DC
  Administered 2011-08-19: 1.4 mg via INTRAVENOUS
  Administered 2011-08-19 – 2011-08-21 (×3): via INTRAVENOUS
  Administered 2011-08-21: 6 mg via INTRAVENOUS
  Administered 2011-08-21: 8 mg via INTRAVENOUS
  Filled 2011-08-19: qty 30

## 2011-08-19 MED ORDER — DIPHENHYDRAMINE HCL 50 MG/ML IJ SOLN: 12.5000 mg | Freq: Four times a day (QID) | INTRAMUSCULAR | Status: DC | PRN

## 2011-08-19 MED ORDER — THROMBIN 20000 UNITS EX KIT: PACK | CUTANEOUS | Status: DC | PRN

## 2011-08-19 MED ORDER — LACTATED RINGERS IV SOLN
INTRAVENOUS | Status: DC | PRN
  Administered 2011-08-19 (×3): via INTRAVENOUS

## 2011-08-19 MED ORDER — SUCCINYLCHOLINE CHLORIDE 20 MG/ML IJ SOLN
INTRAMUSCULAR | Status: DC | PRN
  Administered 2011-08-19: 100 mg via INTRAVENOUS

## 2011-08-19 MED ORDER — INFLUENZA VIRUS VACC SPLIT PF IM SUSP: 0.2500 mL | INTRAMUSCULAR | Status: DC

## 2011-08-19 MED ORDER — GLYCOPYRROLATE 0.2 MG/ML IJ SOLN
INTRAMUSCULAR | Status: DC | PRN
  Administered 2011-08-19: .6 mg via INTRAVENOUS

## 2011-08-19 MED ORDER — HYDROMORPHONE HCL PF 1 MG/ML IJ SOLN
0.2500 mg | INTRAMUSCULAR | Status: DC | PRN
  Administered 2011-08-19 – 2011-08-20 (×5): 0.5 mg via INTRAVENOUS
  Filled 2011-08-19: qty 1

## 2011-08-19 MED ORDER — ONDANSETRON HCL 4 MG/2ML IJ SOLN
4.0000 mg | Freq: Once | INTRAMUSCULAR | Status: AC
  Administered 2011-08-19: 4 mg via INTRAVENOUS

## 2011-08-19 MED ORDER — MENTHOL 3 MG MT LOZG: 1.0000 | LOZENGE | OROMUCOSAL | Status: DC | PRN

## 2011-08-19 MED ORDER — ACETAMINOPHEN 325 MG PO TABS: 650.0000 mg | ORAL_TABLET | ORAL | Status: DC | PRN

## 2011-08-19 MED ORDER — LACTATED RINGERS IV SOLN: INTRAVENOUS | Status: DC

## 2011-08-19 MED ORDER — SODIUM CHLORIDE 0.9 % IJ SOLN: 3.0000 mL | INTRAMUSCULAR | Status: DC | PRN

## 2011-08-19 MED ORDER — FENTANYL CITRATE 0.05 MG/ML IJ SOLN
INTRAMUSCULAR | Status: DC | PRN
  Administered 2011-08-19: 50 ug via INTRAVENOUS
  Administered 2011-08-19: 150 ug via INTRAVENOUS

## 2011-08-19 MED ORDER — POTASSIUM CHLORIDE IN NACL 20-0.9 MEQ/L-% IV SOLN
INTRAVENOUS | Status: DC
  Administered 2011-08-19 – 2011-08-20 (×2): via INTRAVENOUS
  Filled 2011-08-19 (×8): qty 1000

## 2011-08-19 MED ORDER — CEFAZOLIN SODIUM 1-5 GM-% IV SOLN
1.0000 g | Freq: Three times a day (TID) | INTRAVENOUS | Status: AC
  Administered 2011-08-19 – 2011-08-20 (×2): 1 g via INTRAVENOUS
  Filled 2011-08-19 (×2): qty 50

## 2011-08-19 SURGICAL SUPPLY — 68 items
APL SKNCLS STERI-STRIP NONHPOA (GAUZE/BANDAGES/DRESSINGS) ×1
BENZOIN TINCTURE PRP APPL 2/3 (GAUZE/BANDAGES/DRESSINGS) ×2 IMPLANT
BLADE SURG ROTATE 9660 (MISCELLANEOUS) IMPLANT
BUR ACORN 6.0 (BURR) ×2 IMPLANT
BUR MATCHSTICK NEURO 3.0 LAGG (BURR) ×2 IMPLANT
CANISTER SUCTION 2500CC (MISCELLANEOUS) ×1 IMPLANT
CLOSURE STERI STRIP 1/2 X4 (GAUZE/BANDAGES/DRESSINGS) ×1 IMPLANT
CLOTH BEACON ORANGE TIMEOUT ST (SAFETY) ×2 IMPLANT
CONN CROSSLINK REV 38-50MM (Connector) ×2 IMPLANT
CONNECTOR CRSLINK REV 38-50MM (Connector) IMPLANT
CONT SPEC 4OZ CLIKSEAL STRL BL (MISCELLANEOUS) ×3 IMPLANT
COVER BACK TABLE 24X17X13 BIG (DRAPES) IMPLANT
COVER TABLE BACK 60X90 (DRAPES) ×2 IMPLANT
DRAPE C-ARM 42X72 X-RAY (DRAPES) ×4 IMPLANT
DRAPE LAPAROTOMY 100X72X124 (DRAPES) ×2 IMPLANT
DRAPE POUCH INSTRU U-SHP 10X18 (DRAPES) ×2 IMPLANT
DRSG PAD ABDOMINAL 8X10 ST (GAUZE/BANDAGES/DRESSINGS) ×1 IMPLANT
DURAPREP 26ML APPLICATOR (WOUND CARE) ×2 IMPLANT
ELECT BLADE 4.0 EZ CLEAN MEGAD (MISCELLANEOUS) ×2
ELECT REM PT RETURN 9FT ADLT (ELECTROSURGICAL) ×2
ELECTRODE BLDE 4.0 EZ CLN MEGD (MISCELLANEOUS) IMPLANT
ELECTRODE REM PT RTRN 9FT ADLT (ELECTROSURGICAL) ×1 IMPLANT
EVACUATOR 1/8 PVC DRAIN (DRAIN) IMPLANT
GAUZE SPONGE 4X4 12PLY STRL LF (GAUZE/BANDAGES/DRESSINGS) ×1 IMPLANT
GAUZE SPONGE 4X4 16PLY XRAY LF (GAUZE/BANDAGES/DRESSINGS) ×2 IMPLANT
GLOVE BIOGEL M 8.0 STRL (GLOVE) ×4 IMPLANT
GLOVE EXAM NITRILE LRG STRL (GLOVE) IMPLANT
GLOVE EXAM NITRILE MD LF STRL (GLOVE) IMPLANT
GLOVE EXAM NITRILE XL STR (GLOVE) IMPLANT
GLOVE EXAM NITRILE XS STR PU (GLOVE) IMPLANT
GOWN BRE IMP SLV AUR LG STRL (GOWN DISPOSABLE) ×1 IMPLANT
GOWN BRE IMP SLV AUR XL STRL (GOWN DISPOSABLE) ×2 IMPLANT
GOWN STRL REIN 2XL LVL4 (GOWN DISPOSABLE) ×1 IMPLANT
KIT BASIN OR (CUSTOM PROCEDURE TRAY) ×2 IMPLANT
KIT ROOM TURNOVER OR (KITS) ×2 IMPLANT
NDL HYPO 18GX1.5 BLUNT FILL (NEEDLE) IMPLANT
NDL HYPO 21X1.5 SAFETY (NEEDLE) IMPLANT
NDL HYPO 25X1 1.5 SAFETY (NEEDLE) IMPLANT
NEEDLE HYPO 18GX1.5 BLUNT FILL (NEEDLE) IMPLANT
NEEDLE HYPO 21X1.5 SAFETY (NEEDLE) IMPLANT
NEEDLE HYPO 25X1 1.5 SAFETY (NEEDLE) ×2 IMPLANT
NS IRRIG 1000ML POUR BTL (IV SOLUTION) ×2 IMPLANT
PACK FOAM VITOSS 10CC (Orthopedic Implant) ×1 IMPLANT
PACK LAMINECTOMY NEURO (CUSTOM PROCEDURE TRAY) ×2 IMPLANT
PAD ARMBOARD 7.5X6 YLW CONV (MISCELLANEOUS) ×6 IMPLANT
PATTIES SURGICAL .5 X1 (DISPOSABLE) ×2 IMPLANT
PATTIES SURGICAL .5 X3 (DISPOSABLE) IMPLANT
ROD REVERE CURVED 65MM (Rod) ×2 IMPLANT
SCREW REVERE 5.5X45 (Screw) ×4 IMPLANT
SCREW REVERE 5.5X50 (Screw) ×2 IMPLANT
SPACER SUSTAIN O SML 10X22 12M (Spacer) ×2 IMPLANT
SPONGE GAUZE 4X4 12PLY (GAUZE/BANDAGES/DRESSINGS) ×2 IMPLANT
SPONGE LAP 4X18 X RAY DECT (DISPOSABLE) IMPLANT
SPONGE NEURO XRAY DETECT 1X3 (DISPOSABLE) IMPLANT
SPONGE SURGIFOAM ABS GEL 100 (HEMOSTASIS) ×2 IMPLANT
STRIP CLOSURE SKIN 1/2X4 (GAUZE/BANDAGES/DRESSINGS) ×2 IMPLANT
SUT VIC AB 1 CT1 18XBRD ANBCTR (SUTURE) ×2 IMPLANT
SUT VIC AB 1 CT1 8-18 (SUTURE) ×4
SUT VIC AB 2-0 CP2 18 (SUTURE) ×2 IMPLANT
SUT VIC AB 3-0 SH 8-18 (SUTURE) ×2 IMPLANT
SYR 20CC LL (SYRINGE) IMPLANT
SYR 20ML ECCENTRIC (SYRINGE) ×2 IMPLANT
SYR 5ML LL (SYRINGE) IMPLANT
TAPE CLOTH SURG 4X10 WHT LF (GAUZE/BANDAGES/DRESSINGS) ×1 IMPLANT
TOWEL OR 17X24 6PK STRL BLUE (TOWEL DISPOSABLE) ×2 IMPLANT
TOWEL OR 17X26 10 PK STRL BLUE (TOWEL DISPOSABLE) ×2 IMPLANT
TRAY FOLEY CATH 14FRSI W/METER (CATHETERS) ×2 IMPLANT
WATER STERILE IRR 1000ML POUR (IV SOLUTION) ×2 IMPLANT

## 2011-08-19 NOTE — Anesthesia Postprocedure Evaluation (Signed)
  Anesthesia Post-op Note  Patient: Jenny [name]  Procedure(s) Performed:  POSTERIOR LUMBAR FUSION 2 LEVEL - L4-5/L5-S1 Discectomy Cages Pedicle Screws, Posterior Lateral Arthrodesis, Cellsaver   Patient Location: PACU  Anesthesia Type: General  Level of Consciousness: awake  Airway and Oxygen Therapy: Patient Spontanous Breathing  Post-op Pain: mild  Post-op Assessment: Post-op Vital signs reviewed  Post-op Vital Signs: stable  Complications: No apparent anesthesia complications

## 2011-08-19 NOTE — OR Nursing (Signed)
Dr. Oliva Bustard called report back to room regarding first plain film obtained.

## 2011-08-19 NOTE — H&P (Signed)
Jenny [name] is an 70 y.o. female.   Chief Complaint: lower back pain with chronic lumbar radiculopathies [id number] has been complaining of lower back pain with irradiation to both legs,she has failed conservative treatment. Because radiological findings she agrees with surgical intervention  Past Medical History  Diagnosis Date  . Allergy     rhinitis  . Bronchitis   . Venous insufficiency   . Obesity   . Hypothyroidism   . GERD (gastroesophageal reflux disease)   . Diverticulosis of colon   . Urinary incontinence   . Degenerative joint disease   . Lumbar back pain   . Vitamin D deficiency   . Dysthymia   . Asthma     mild, intermittent  . Transient memory loss March 2006    after a stressful event she had no memory the rest of that day  . VITAMIN D DEFICIENCY 08/21/2008  . VENOUS INSUFFICIENCY 08/23/2007  . Other urinary incontinence 03/12/2010  . OBESITY 08/23/2007  . HYPOTHYROIDISM 08/23/2007  . HAND PAIN 04/09/2010  . GERD 08/23/2007  . 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  . DIVERTICULOSIS OF COLON 02/22/2008  . DIABETES MELLITUS, BORDERLINE 08/21/2008  . DEGENERATIVE JOINT DISEASE 02/22/2008  . BRONCHITIS, RECURRENT 02/22/2008  . BACK PAIN, LUMBAR 08/23/2007  . ALLERGIC RHINITIS 08/23/2007  . Asthma 01/14/2011  . 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 History of chicken pox 01/14/2011  . History of measles 01/14/2011  . PONV (postoperative nausea and vomiting)   . Peripheral neuropathy 02/12/2011    possibly related to spondylolisthesis per pt  . Urinary frequency   . Cough     usually not productive  . Incontinence of urine   . Obstructive sleep apnea     does not use CPAP, lost weight  . SLEEP APNEA, OBSTRUCTIVE 08/23/2007  . Hypertension   . HYPERTENSION 02/22/2008    Past Surgical History  Procedure Date  . Appendectomy   . Tonsillectomy and adenoidectomy   . Ap repair and sling for cystocele 8-04     Dr Patsi Sears  . Left hand 2010    left thumb, joint removal, [facility]  .  Ctr     bilateral  . Shoulder arthroscopy distal clavicle excision and open rotator cuff repair     right  . Neck surgery     x 2 - discectomy, fusion  . Abdominal hysterectomy     took one ovary  . Cholecystectomy   . Carpal tunel 2008    Family History  Problem Relation Age of Onset  . COPD Mother   . Pneumonia Mother   . Other Mother     CHF  . Hypertension Mother   . Arthritis Mother     septic knee after replacement  . Heart disease Mother     chf  . Dementia Father   . Arthritis Brother   . Asthma Daughter   . Cancer Maternal Aunt     breast  . Stroke Maternal Grandmother   . Heart disease Maternal Grandmother   . Other Maternal Grandfather     CHF  . Heart disease Maternal Grandfather     chf  . Cancer Paternal Grandmother     stomach?  . Other Paternal Grandfather     problems with kidneys  . Kidney disease Paternal Grandfather   . Anxiety disorder Daughter   . Cancer Maternal Aunt     liver   Social History:  reports that she quit smoking about 19 years ago. Her smoking use  included Cigarettes. She has a 45 pack-year smoking history. She has never used smokeless tobacco. She reports that she does not drink alcohol or use illicit drugs.  Allergies:  Allergies  Allergen Reactions  . Codeine     REACTION: nausea  . Prednisone     "I forget who I am"  Medrol is ok    Medications Prior to Admission  Medication Dose Route Frequency Provider Last Rate Last Dose  . ceFAZolin (ANCEF) IVPB 1 g/50 mL premix  1 g Intravenous 60 min Pre-Op Tanya Nones Eliezer Khawaja       Medications Prior to Admission  Medication Sig Dispense Refill  . albuterol (PROVENTIL HFA;VENTOLIN HFA) 108 (90 BASE) MCG/ACT inhaler Inhale 2 puffs into the lungs every 6 (six) hours as needed. For wheezing       . budesonide-formoterol (SYMBICORT) 160-4.5 MCG/ACT inhaler Inhale 1 puff into the lungs 2 (two) times daily.        . calcium carbonate (OS-CAL) 600 MG TABS Take 600 mg by mouth 2 (two)  times daily with a meal.       . clotrimazole-betamethasone (LOTRISONE) cream Apply 1 application topically 2 (two) times daily as needed. Rash, itching       . famotidine (PEPCID) 10 MG tablet Take 10 mg by mouth 2 (two) times daily as needed. For indigestion      . guaiFENesin (MUCINEX) 600 MG 12 hr tablet Take 1,200 mg by mouth 2 (two) times daily.        Marland Kitchen nystatin (MYCOSTATIN) ointment Apply 1 application topically 2 (two) times daily as needed. For rash      . potassium chloride SA (K-DUR,KLOR-CON) 20 MEQ tablet Take 20 mEq by mouth daily as needed. With lasix; for swelling       . ALPRAZolam (XANAX) 0.5 MG tablet Take 0.5 mg by mouth 3 (three) times daily as needed. For anxiety      . benzonatate (TESSALON PERLES) 100 MG capsule Take 1 capsule (100 mg total) by mouth 3 (three) times daily as needed for cough.  30 capsule  0  . Cholecalciferol (VITAMIN D) 1000 UNITS capsule Take 1,000 Units by mouth daily.       . furosemide (LASIX) 40 MG tablet Take 40 mg by mouth daily as needed. For swelling          Results for orders placed during the hospital encounter of 08/19/11 (from the past 48 hour(s))  TYPE AND SCREEN     Status: Normal   Collection Time   08/19/11  7:35 AM      Component Value Range Comment   ABO/RH(D) O POS      Antibody Screen NEG      Sample Expiration 08/22/2011      No results found.  ROS  Blood pressure 143/77, pulse 92, temperature 97.9 F (36.6 C), temperature source Oral, resp. rate 20, SpO2 94.00%. Physical Exam heent normal. Neck; SCAR ANTERIORLY. Lungs mild ronchii bilaterally.cario nl. Abdomen: scar from previous surgery.Marland Kitchenextremities:grade 1 edema. Neuro: weakness of dorsiflexion of both feet. slr positive at 30 degrees.  Assessment/Plan  xrays showed stepoff at lal5 and less at l5s1. Facet arthropathy at both levels. She is to have decompression and fusion with pedicles screws and cages as well as postlat arthrodesis  Evoleth Nordmeyer M 08/19/2011, 8:43  AM

## 2011-08-19 NOTE — Anesthesia Preprocedure Evaluation (Addendum)
Anesthesia Evaluation  Patient identified by MRN, date of birth, ID band Patient awake    Reviewed: Allergy & Precautions, H&P , NPO status , Patient's Chart, lab work & pertinent test results, Unable to perform ROS - Chart review only  History of Anesthesia Complications (+) PONV, AWARENESS UNDER ANESTHESIA and Family history of anesthesia reaction  Airway Mallampati: II TM Distance: <3 FB Neck ROM: Full    Dental  (+) Missing and Dental Advisory Given   Pulmonary asthma (controlled on inhaler) , sleep apnea and Continuous Positive Airway Pressure Ventilation ,  clear to auscultation        Cardiovascular hypertension, Regular Normal    Neuro/Psych PSYCHIATRIC DISORDERS Anxiety Depression  Neuromuscular disease    GI/Hepatic GERD-  Medicated and Controlled,  Endo/Other  Hypothyroidism (on synthroid)   Renal/GU      Musculoskeletal   Abdominal (+) obese,  Abdomen: soft. Bowel sounds: normal.  Peds  Hematology   Anesthesia Other Findings   Reproductive/Obstetrics                         Anesthesia Physical Anesthesia Plan  ASA: III  Anesthesia Plan: General   Post-op Pain Management:    Induction: Intravenous  Airway Management Planned: Oral ETT  Additional Equipment:   Intra-op Plan:   Post-operative Plan: Extubation in OR  Informed Consent: I have reviewed the patients History and Physical, chart, labs and discussed the procedure including the risks, benefits and alternatives for the proposed anesthesia with the patient or authorized representative who has indicated his/her understanding and acceptance.   Dental advisory given  Plan Discussed with: CRNA  Anesthesia Plan Comments:         Anesthesia Quick Evaluation

## 2011-08-19 NOTE — Brief Op Note (Signed)
08/19/2011  12:40 PM  PATIENT:  Jenny [name]  70 y.o. female  PRE-OPERATIVE DIAGNOSIS:  Aquired Spondylolesthesis   POST-OPERATIVE DIAGNOSIS:  Acquired Spondylolesthesis  PROCEDURE:  Procedure(s): POSTERIOR LUMBAR FUSION 2 LEVEL.L3 LAMINECTOMIES  SURGEON:  Surgeon(s): Tanya Nones Maleeah Crossman  PHYSICIAN ASSISTANT:r NUDELMAN  ASSISTANTS: none   ANESTHESIA:  GENERAL  EBL:  Total I/O In: 3610 [I.V.:3000; Blood:110; IV Piggyback:500] Out: 850 [Urine:450; Blood:400]  BLOOD ADMINISTERED:none  DRAINS: none   LOCAL MEDICATIONS USED:  NONE  SPECIMEN:  No Specimen  DISPOSITION OF SPECIMEN:  N/A  COUNTS:  YES  TOURNIQUET:  * No tourniquets in log *  DICTATION: .Other Dictation: Dictation Number [id number]  PLAN OF CARE: Admit to inpatient   PATIENT DISPOSITION:  PACU - hemodynamically stable.   Delay start of Pharmacological VTE agent (>24hrs) due to surgical blood loss or risk of bleeding:  yes

## 2011-08-19 NOTE — Preoperative (Signed)
Beta Blockers   Reason not to administer Beta Blockers:Not Applicable 

## 2011-08-19 NOTE — Transfer of Care (Signed)
Immediate Anesthesia Transfer of Care Note  Patient: Jenny [name]  Procedure(s) Performed:  POSTERIOR LUMBAR FUSION 2 LEVEL - L4-5/L5-S1 Discectomy Cages Pedicle Screws, Posterior Lateral Arthrodesis, Cellsaver   Patient Location: PACU  Anesthesia Type: General  Level of Consciousness: awake  Airway & Oxygen Therapy: Patient Spontanous Breathing  Post-op Assessment: Report given to PACU RN  Post vital signs: stable  Complications: No apparent anesthesia complications

## 2011-08-19 NOTE — Op Note (Signed)
NAME:  Jenny [name], Jenny [name] NO.:  [id number]  MEDICAL RECORD NO.:  [id number]  LOCATION:  [facility]                         FACILITY:  [facility]  PHYSICIAN:  Hilda Lias, M.D.   DATE OF BIRTH:  August 14, 1941  DATE OF PROCEDURE:  08/19/2011 DATE OF DISCHARGE:                              OPERATIVE REPORT   PREOPERATIVE DIAGNOSIS:  L4-L5 and L5-S1 spondylolisthesis with degenerative disk disease and chronic radiculopathy.  POSTOPERATIVE DIAGNOSES:  L4-L5 and L5-S1 spondylolisthesis with degenerative disk disease and chronic radiculopathy plus L3 stenosis.  PROCEDURE:  Bilateral L3, L4, L5 laminectomies.  Bilateral L4-L5 diskectomy, regions normally we do.  Bilateral L4-L5 facetectomies. Interbody fusion with cages 10 x 22 at the level of L4-L5.  Pedicle screws from L4 to L5-S1 bilaterally.  Posterolateral arthrodesis with autograft and Vitoss.  Cell Saver.  C-arm.  SURGEON:  Hilda Lias, MD  ASSISTANT:  Hewitt Shorts, MD  CLINICAL HISTORY:  The patient said several months ago underwent fusion with the cervical area.  She had been complaining of back pain with radiation to both lower extremities.  We have x-rays from the past 5 years which shows her spondylolisthesis is getting worse.  Now not only stenosis at the level of L4-L5 but also at the level of L5-S1.  During surgery, we found that she also had quite a bit of stenosis at the level of L3-L4.  Because of that surgery was advised.  PROCEDURE IN DETAIL:  The patient was taken to the OR and after intubation, she was positioned in prone manner.  The back was cleaned with DuraPrep.  A midline incision was made.  Because of her obesity, we were not able to feel any spinous process.  The incision was carried out all the way until we found the spinous process. Lateral dissection was carried down.  We were able to see on film the space of L3-L4, L4-L5, L5-S1.  We started with removal of the spinous process of L5.   We did a x-ray which showed that one probe was right at the level of L4-L5.  After we removed the spinous process of L5, we proceeded with removal of L4.  At this stage, we found that she had quite a bit of stenosis at L3-L4.  We proceeded with bilateral laminectomy of L3.  At the level of L4-L5, the facet was removed bilaterally.  The disk at the level of L5-S1 was quite narrowed.  We decided not to do any discectomy at this level.  At the level of L4-L5, we did bilateral discectomy, first on the right side, next on the left side, region where normally we do.  The endplates were removed.  Then 2 cages of 10 x 22 with Vitoss and autograft were inserted.  Then using the C-arm in AP view and lateral view, we brought the pedicle of L4, L5, and S1.  Four screws of 5.5 x 45 were inserted at the level of L4-L5 and 5.5 x 50 at the level of S1.  They were corrected with a rod as well as caps.  A cross-link from right to left was done. Then we went laterally.  We removed the periosteum of L4-L5 and  L5-S1 and a mix of autograft and Vitoss was used for arthrodesis.  We achieved full hemostasis.  The wound was irrigated and closed in different layers using Vicryl and Steri-Strips.          ______________________________ Hilda Lias, M.D.     EB/MEDQ  D:  08/19/2011  T:  08/19/2011  Job:  [id number]

## 2011-08-20 MED ORDER — MORPHINE SULFATE (PF) 1 MG/ML IV SOLN
INTRAVENOUS | Status: AC
  Filled 2011-08-20: qty 25

## 2011-08-20 MED ORDER — FAMOTIDINE 10 MG PO TABS
10.0000 mg | ORAL_TABLET | Freq: Every day | ORAL | Status: DC
  Administered 2011-08-20 – 2011-08-24 (×5): 10 mg via ORAL
  Filled 2011-08-20 (×5): qty 1

## 2011-08-20 NOTE — Progress Notes (Signed)
Afebril. C/o of incisional pain. No weakness. Pt/ot to see her. Foley out today.

## 2011-08-20 NOTE — Progress Notes (Signed)
Utilization review completed. Anette Guarneri, RN, BSN. 08/20/11

## 2011-08-21 DIAGNOSIS — M47817 Spondylosis without myelopathy or radiculopathy, lumbosacral region: Secondary | ICD-10-CM

## 2011-08-21 DIAGNOSIS — IMO0002 Reserved for concepts with insufficient information to code with codable children: Secondary | ICD-10-CM

## 2011-08-21 MED ORDER — DOCUSATE SODIUM 283 MG RE ENEM
1.0000 | ENEMA | Freq: Every day | RECTAL | Status: DC
  Administered 2011-08-22 – 2011-08-23 (×2): 283 mg via RECTAL
  Filled 2011-08-21 (×4): qty 1

## 2011-08-21 MED ORDER — OXYCODONE HCL 5 MG PO TABS
10.0000 mg | ORAL_TABLET | ORAL | Status: DC | PRN
  Administered 2011-08-21: 10 mg via ORAL
  Administered 2011-08-21: 5 mg via ORAL
  Administered 2011-08-22: 10 mg via ORAL
  Filled 2011-08-21: qty 2
  Filled 2011-08-21: qty 1
  Filled 2011-08-21 (×3): qty 2

## 2011-08-21 MED ORDER — MORPHINE SULFATE (PF) 1 MG/ML IV SOLN
INTRAVENOUS | Status: AC
  Filled 2011-08-21: qty 25

## 2011-08-21 MED FILL — Heparin Sodium (Porcine) Inj 1000 Unit/ML: INTRAMUSCULAR | Qty: 1 | Status: AC

## 2011-08-21 MED FILL — Sodium Chloride Irrigation Soln 0.9%: Qty: 3000 | Status: AC

## 2011-08-21 MED FILL — Sodium Chloride IV Soln 0.9%: INTRAVENOUS | Qty: 1000 | Status: AC

## 2011-08-21 NOTE — Progress Notes (Signed)
Physical Therapy Evaluation Patient Details Name: Jenny [name] MRN: [id number] DOB: 1941-04-02 Today's Date: 08/21/2011  Problem List:  Patient Active Problem List  Diagnoses  . HYPOTHYROIDISM  . OBESITY  . DYSTHYMIA  . SLEEP APNEA, OBSTRUCTIVE  . HYPERTENSION  . VENOUS INSUFFICIENCY  . ALLERGIC RHINITIS  . BRONCHITIS, RECURRENT  . GERD  . DIVERTICULOSIS OF COLON  . DEGENERATIVE JOINT DISEASE  . BACK PAIN, LUMBAR  . HAND PAIN  . OTHER URINARY INCONTINENCE  . DIABETES MELLITUS, BORDERLINE  . Asthma  . Anaphylaxis due to food  . History of chicken pox  . History of measles  . Intertrigo  . Vitamin D deficiency  . Peripheral neuropathy    Past Medical History:  Past Medical History  Diagnosis Date  . Allergy     rhinitis  . Bronchitis   . Venous insufficiency   . Obesity   . Hypothyroidism   . GERD (gastroesophageal reflux disease)   . Diverticulosis of colon   . Urinary incontinence   . Degenerative joint disease   . Lumbar back pain   . Vitamin D deficiency   . Dysthymia   . Asthma     mild, intermittent  . Transient memory loss March 2006    after a stressful event she had no memory the rest of that day  . VITAMIN D DEFICIENCY 08/21/2008  . VENOUS INSUFFICIENCY 08/23/2007  . Other urinary incontinence 03/12/2010  . OBESITY 08/23/2007  . HYPOTHYROIDISM 08/23/2007  . HAND PAIN 04/09/2010  . GERD 08/23/2007  . 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  . DIVERTICULOSIS OF COLON 02/22/2008  . DIABETES MELLITUS, BORDERLINE 08/21/2008  . DEGENERATIVE JOINT DISEASE 02/22/2008  . BRONCHITIS, RECURRENT 02/22/2008  . BACK PAIN, LUMBAR 08/23/2007  . ALLERGIC RHINITIS 08/23/2007  . Asthma 01/14/2011  . 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 History of chicken pox 01/14/2011  . History of measles 01/14/2011  . PONV (postoperative nausea and vomiting)   . Peripheral neuropathy 02/12/2011    possibly related to spondylolisthesis per pt  . Urinary frequency   . Cough     usually not productive   . Incontinence of urine   . Obstructive sleep apnea     does not use CPAP, lost weight  . SLEEP APNEA, OBSTRUCTIVE 08/23/2007  . Hypertension   . HYPERTENSION 02/22/2008   Past Surgical History:  Past Surgical History  Procedure Date  . Appendectomy   . Tonsillectomy and adenoidectomy   . Ap repair and sling for cystocele 8-04     Dr Patsi Sears  . Left hand 2010    left thumb, joint removal, CTR  . Ctr     bilateral  . Shoulder arthroscopy distal clavicle excision and open rotator cuff repair     right  . Neck surgery     x 2 - discectomy, fusion  . Abdominal hysterectomy     took one ovary  . Cholecystectomy   . Carpal tunel 2008    PT Assessment/Plan/Recommendation PT Assessment Clinical Impression Statement: Pt. is a Abdalrahman Clementson female s/p L4-S1 PLIF with limited mobility secondary to significant pain and poor coordination of LEs.  Will benefit from PT to address mobility, balance, safety and precautions to reach maximal functional level.   PT Recommendation/Assessment: Patient will need skilled PT in the acute care venue PT Problem List: Decreased strength;Decreased activity tolerance;Decreased balance;Decreased mobility;Decreased coordination;Decreased knowledge of use of DME;Decreased safety awareness;Decreased knowledge of precautions;Cardiopulmonary status limiting activity;Pain Barriers to Discharge: Decreased caregiver support PT Therapy Diagnosis :  Difficulty walking;Acute pain PT Plan PT Frequency: Min 6X/week PT Treatment/Interventions: DME instruction;Gait training;Functional mobility training;Therapeutic exercise;Balance training;Patient/family education PT Recommendation Follow Up Recommendations: Skilled nursing facility;24 hour supervision/assistance Equipment Recommended: Rolling walker with 5" wheels;3 in 1 bedside comode;Defer to next venue PT Goals  Acute Rehab PT Goals PT Goal Formulation: With patient Time For Goal Achievement: 2 weeks Pt will Roll  Supine to Right Side: with modified independence;with rail PT Goal: Rolling Supine to Right Side - Progress: Other (comment) Pt will go Supine/Side to Sit: with modified independence;with rail;with HOB 0 degrees PT Goal: Supine/Side to Sit - Progress: Other (comment) Pt will Transfer Sit to Stand/Stand to Sit: with min assist;with upper extremity assist PT Transfer Goal: Sit to Stand/Stand to Sit - Progress: Other (comment) Pt will Ambulate: 51 - 150 feet;with supervision;with least restrictive assistive device PT Goal: Ambulate - Progress: Other (comment) Pt will Go Up / Down Stairs: 1-2 stairs;with min assist;with least restrictive assistive device PT Goal: Up/Down Stairs - Progress: Other (comment) Additional Goals Additional Goal #1: Patient will follow 3/3 back precautions with all mobility.  PT Evaluation Precautions/Restrictions  Precautions Precautions: Back Precaution Booklet Issued: Yes (comment) Precaution Comments: Needs cues for reinforcement with all activity Required Braces or Orthoses: Yes Spinal Brace: Lumbar corset;Applied in sitting position Prior Functioning  Home Living Lives With: Daughter;Other (Comment) (and grandson) Receives Help From:  (Pt. 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 with poor coordination of right LE overall Extremity Assessment RUE Assessment RUE Assessment: Within Functional Limits LUE Assessment LUE Assessment: Within Functional Limits RLE Assessment RLE Assessment: Exceptions to [facility] RLE Strength RLE Overall Strength: Deficits;Due to premorbid status;Due to pain (grossly 3/5) LLE Assessment LLE Assessment: Exceptions to [facility] LLE Strength LLE Overall Strength: Within Functional Limits for tasks assessed;Due to pain Mobility (including Balance) Bed Mobility Bed Mobility: Yes Sit to Supine - Right: 1: +2 Total assist;Patient percentage (comment);HOB flat (pt. = 50 %) Sit to Supine - Right Details (indicate cue type and reason): Pt. needed assist to place LEs on bed as well as for upper body assist.  Needed cues and assist for log roll as well.   Transfers Transfers: Yes Sit to Stand: 1: +2 Total assist;Patient percentage (comment);With upper extremity assist;From chair/3-in-1 (pt. = 40% and used pad and rocking 3x to achieve standing) Sit to Stand Details (indicate cue type and reason): Took several attempts for pt. to perform sit to stand as she is weak in bil LEs Stand to Sit: With upper extremity assist;1: +2 Total assist;Patient percentage (comment);To bed Stand to Sit Details: Pt. needed assist to control descent onto bed. Stand Pivot Transfers: 1: +2 Total assist Stand Pivot Transfer Details (indicate cue type and reason): pt.= 60 % to transition pivoting chair  to bed.  Pt. has difficulty coordinating right LE to pivot around to bed.  Needed max cuing to move LEs effectively and tot assist to move RW for sequencing.Pt. also needed cues for upright  posture as well as she tended to stay flexed.   Ambulation/Gait Ambulation/Gait: No Stairs: No Wheelchair Mobility Wheelchair Mobility: No  Posture/Postural Control Posture/Postural Control: Postural limitations Postural Limitations: Flexed posture. Balance Balance Assessed: No Exercise  General Exercises - Lower Extremity Ankle Circles/Pumps: AROM;Strengthening;Both;5 reps;Supine Quad Sets: AROM;Strengthening;Both;5 reps;Supine End of Session PT - End of Session Equipment Utilized During Treatment: Gait belt;Back brace Activity Tolerance: Patient tolerated treatment well;Patient limited by pain Patient left: in bed;with call bell in reach Nurse Communication: Mobility status for transfers (encouraged nursing to use RW for transfers) General Behavior During Session: [facility] for tasks performed Cognition: [facility] for tasks performed  INGOLD,Reef Achterberg 08/21/2011, 11:18 AM

## 2011-08-21 NOTE — Progress Notes (Signed)
Subjective: Patient reports c/o lower back pain. Upset because she was told she needs to go to a nursing facility.no weakness. Foley out. Continue pt/ot Objective: Vital signs in last 24 hours: Temp:  [97.9 F (36.6 C)-99.3 F (37.4 C)] 97.9 F (36.6 C) (11/09 1042) Pulse Rate:  [107-115] 113  (11/09 1042) Resp:  [18-97] 20  (11/09 1042) BP: (117-130)/(67-78) 121/68 mmHg (11/09 1042) SpO2:  [94 %-99 %] 96 % (11/09 1042) Weight:  [107.639 kg (237 lb 4.8 oz)] 237 lb 4.8 oz (107.639 kg) (11/08 1840)  Intake/Output from previous day: 11/08 0701 - 11/09 0700 In: -  Out: 650 [Urine:650] Intake/Output this shift:      Lab Results: No results found for this basename: WBC:2,HGB:2,HCT:2,PLT:2 in the last 72 hours BMET No results found for this basename: NA:2,K:2,CL:2,CO2:2,GLUCOSE:2,BUN:2,CREATININE:2,CALCIUM:2 in the last 72 hours  Studies/Results: No results found.  Assessment/Plan: Dc morpine iv. laxative  LOS: 2 days  ot/pt. Rehabilitation consult  Tandra Rosado M 08/21/2011, 12:10 PM

## 2011-08-21 NOTE — Consult Note (Signed)
Physical Medicine and Rehabilitation Consult Reason for Consult Back pain, weakness Referring Phsyician: Dr.Botero  HISTORY OF PRESENT ILLNESS:   Jenny [name] is a 70 y.o. female.  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  Patient elected to undergo bilateral L3-L5 laminectomies with facetectomies and fusion by Dr. Jeral Fruit on 08/19/2011.  Post op with back pain and difficulty with RLE coordiantion and needs maximal cues for posture.  MD recommending CIR.   Review of Systems  HENT: Negative.   Eyes: Negative.   Respiratory: Positive for cough (chronic.).   Cardiovascular: Negative.   Gastrointestinal: Negative.   Genitourinary: Negative.   Musculoskeletal: Positive for myalgias and back pain.       Spasms BLE.  Neurological: Positive for sensory change (numbness bilateral hands.) and weakness.  Endo/Heme/Allergies: Bruises/bleeds easily.  Psychiatric/Behavioral: Negative.   All other systems reviewed and are negative.   @ROS @ Past Medical History  Diagnosis Date  . Allergy     rhinitis  . Bronchitis   . Venous insufficiency   . Obesity   . Hypothyroidism   . GERD (gastroesophageal reflux disease)   . Diverticulosis of colon   . Urinary incontinence   . Degenerative joint disease   . Lumbar back pain   . Vitamin D deficiency   . Dysthymia   . Asthma     mild, intermittent  . Transient memory loss March 2006    after a stressful event she had no memory the rest of that day  . VITAMIN D DEFICIENCY 08/21/2008  . VENOUS INSUFFICIENCY 08/23/2007  . Other urinary incontinence 03/12/2010  . OBESITY 08/23/2007  . HYPOTHYROIDISM 08/23/2007  . HAND PAIN 04/09/2010  . GERD 08/23/2007  . 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  . DIVERTICULOSIS OF COLON 02/22/2008  . DIABETES MELLITUS, BORDERLINE 08/21/2008  . DEGENERATIVE JOINT DISEASE 02/22/2008  . BRONCHITIS,  RECURRENT 02/22/2008  . BACK PAIN, LUMBAR 08/23/2007  . ALLERGIC RHINITIS 08/23/2007  . Asthma 01/14/2011  . 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 History of chicken pox 01/14/2011  . History of measles 01/14/2011  . PONV (postoperative nausea and vomiting)   . Peripheral neuropathy 02/12/2011    possibly related to spondylolisthesis per pt  . Urinary frequency   . Cough     usually not productive  . Incontinence of urine   . Obstructive sleep apnea     does not use CPAP, lost weight  . SLEEP APNEA, OBSTRUCTIVE 08/23/2007  . Hypertension   . HYPERTENSION 02/22/2008   Past Surgical History  Procedure Date  . Appendectomy   . Tonsillectomy and adenoidectomy   . Ap repair and sling for cystocele 8-04     Dr Patsi Sears  . Left hand 2010    left thumb, joint removal, CTR  . Ctr     bilateral  . Shoulder arthroscopy distal clavicle excision and open rotator cuff repair     right  . Neck surgery     x 2 - discectomy, fusion  . Abdominal hysterectomy     took one ovary  . Cholecystectomy   . Carpal tunel 2008   Family History  Problem Relation Age of Onset  . COPD Mother   . Pneumonia Mother   . Other Mother     CHF  . Hypertension Mother   . Arthritis Mother     septic knee after replacement  . Heart  disease Mother     chf  . Dementia Father   . Arthritis Brother   . Asthma Daughter   . Cancer Maternal Aunt     breast  . Stroke Maternal Grandmother   . Heart disease Maternal Grandmother   . Other Maternal Grandfather     CHF  . Heart disease Maternal Grandfather     chf  . Cancer Paternal Grandmother     stomach?  . Other Paternal Grandfather     problems with kidneys  . Kidney disease Paternal Grandfather   . Anxiety disorder Daughter   . Cancer Maternal Aunt     liver   Social History: Lives with family.  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 Rereports that she quit smoking about 20+ years ago. Her smoking use included Cigarettes. She  has a 45 pack-year smoking history. She has never used smokeless tobacco. She reports that she does not drink alcohol or use illicit drugs.  Has a supportive family...another daughter can provide supervision at home past discharge.   Allergies:  Allergies  Allergen Reactions  . Codeine     REACTION: nausea  . Prednisone     "I forget who I am"  Medrol is ok    Medications Prior to Admission  Medication Sig Dispense Refill  . albuterol (PROVENTIL HFA;VENTOLIN HFA) 108 (90 BASE) MCG/ACT inhaler Inhale 2 puffs into the lungs every 6 (six) hours as needed. For wheezing       . budesonide-formoterol (SYMBICORT) 160-4.5 MCG/ACT inhaler Inhale 1 puff into the lungs 2 (two) times daily.        . calcium carbonate (OS-CAL) 600 MG TABS Take 600 mg by mouth 2 (two) times daily with a meal.       . clotrimazole-betamethasone (LOTRISONE) cream Apply 1 application topically 2 (two) times daily as needed. Rash, itching       . famotidine (PEPCID) 10 MG tablet Take 10 mg by mouth 2 (two) times daily as needed. For indigestion      . guaiFENesin (MUCINEX) 600 MG 12 hr tablet Take 1,200 mg by mouth 2 (two) times daily.        Marland Kitchen nystatin (MYCOSTATIN) ointment Apply 1 application topically 2 (two) times daily as needed. For rash      . potassium chloride SA (K-DUR,KLOR-CON) 20 MEQ tablet Take 20 mEq by mouth daily as needed. With lasix; for swelling       . ALPRAZolam (XANAX) 0.5 MG tablet Take 0.5 mg by mouth 3 (three) times daily as needed. For anxiety      . benzonatate (TESSALON PERLES) 100 MG capsule Take 1 capsule (100 mg total) by mouth 3 (three) times daily as needed for cough.  30 capsule  0  . Cholecalciferol (VITAMIN D) 1000 UNITS capsule Take 1,000 Units by mouth daily.       . furosemide (LASIX) 40 MG tablet Take 40 mg by mouth daily as needed. For swelling          Home: Home Living Lives With: Daughter;Other (Comment) (and grandson) Receives Help From:  (Pt. 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 = 50 %) Sit to Supine - Right Details (indicate cue type and reason): Pt. needed assist to place LEs on bed as well as for upper body assist.  Needed cues and assist for log roll as well.   Transfers Transfers: Yes Sit to Stand: 1: +2 Total assist;Patient percentage (comment);With upper extremity assist;From chair/3-in-1 (pt. = 40% and used pad and rocking 3x to achieve standing) Sit to Stand Details (indicate cue type and reason): Took several attempts for pt. to perform sit to stand as she is weak in bil LEs Stand to Sit: With upper extremity assist;1: +2 Total assist;Patient percentage (comment);To bed Stand to Sit Details: Pt. needed assist to control descent onto bed. Stand Pivot Transfers: 1: +2 Total assist Stand Pivot Transfer Details (indicate cue type and reason): pt.= 60 % to transition pivoting chair to bed.  Pt. has difficulty coordinating right LE to pivot around to bed.  Needed max cuing to move LEs effectively and tot assist to move RW for sequencing.Pt. also needed cues for upright posture as well as she tended to stay flexed.   Ambulation/Gait Ambulation/Gait: No Stairs: No Wheelchair Mobility Wheelchair Mobility: No  ADL:    Cognition: Cognition Arousal/Alertness:  Awake/alert Orientation Level: Oriented X4 Cognition Arousal/Alertness: Awake/alert Overall Cognitive Status: Appears within functional limits for tasks assessed Orientation Level: Oriented X4  Blood pressure 121/68, pulse 113, temperature 97.9 F (36.6 C), temperature source Oral, resp. rate 20, height 5\' 5"  (1.651 m), weight 107.639 kg (237 lb 4.8 oz), SpO2 96.00%.  The patient is alert and oriented x3. She is obese. Multiple bruises are noted over the forearms. Back wound is clean with a dry dressing. Low back is appropriately tender. Patient with pain in addition weakness in both proximal lower [place]. No focal sentry findings are appreciated. Strength in the upper extremities is 4-5 out of 5 proximal to distal. Partially strength is 2/5 proximally to 5 out of 5 distally. Patient with occasional spasm with passive and active attempts at movement in the lower tremulous. Heart was regular rate and rhythm. Chest is clear. Abdomen soft nontender. Cognitively the patient is appropriate. Cranial nerve exam is intact.  No results found for this or any previous visit (from the past 24 hour(s)). No results found.  Assessment/Plan: Diagnosis: Lumbar spondylolisthesis with radiculopathy. 1. Does the need for close, 24 hr/day medical supervision in concert with the patient's rehab needs make it unreasonable for this patient to be served in a less intensive setting? Yes 2. Co-Morbidities requiring supervision/potential complications: Obesity obstructive sleep apnea pain management history of asthma and type 2 diabetes. 3. Due to bladder management, bowel management, safety, skin/wound care, disease management, medication administration, pain management and patient education, does the patient require 24 hr/day rehab nursing? Yes 4. Does the patient require coordinated care of a physician, rehab nurse, PT (1-2 hrs/day, 5 days/week) and OT (1-2 hrs/day, 5 days/week) to address physical and functional  deficits in the context of the above medical diagnosis(es)? Yes and Potentially Addressing deficits in the following areas: balance, bathing, bowel/bladder control, dressing, endurance, grooming, locomotion, strength, toileting and transferring 5. Can the patient actively participate in an intensive therapy program of at least 3 hrs of therapy per day at least 5 days per week?  Yes 6. The potential for patient to make measurable gains while on inpatient rehab is excellent 7. Anticipated functional outcomes upon discharge from inpatients are supervision to modified independent PT, supervision to modified independent OT 8. Estimated rehab length of stay to reach the above functional goals is: 1 week plus 9. Does the patient have adequate social supports to accommodate these discharge functional goals? Yes 10. Anticipated D/C setting: Home 11. Anticipated post D/C treatments: Outpt therapy 12. Overall Rehab/Functional Prognosis: excellent  RECOMMENDATIONS: This patient's condition is appropriate for continued rehabilitative care in the following setting: CIR vs HH Patient has agreed to participate in recommended program. Yes Note that insurance prior authorization may be required for reimbursement for recommended care.  Comment: Pt is post op day #2 . We will follow for ongoing medical stability and for functional progress. Pain management is ongoing the rate limiting factor at this point. May benefit from a short inpatient rehabilitation stay versus discharge home with home health therapies.   Haroldine Laws 08/21/2011

## 2011-08-22 MED ORDER — OXYCODONE-ACETAMINOPHEN 5-325 MG PO TABS
1.0000 | ORAL_TABLET | ORAL | Status: DC | PRN
  Administered 2011-08-22 – 2011-08-24 (×4): 1 via ORAL
  Filled 2011-08-22 (×5): qty 1

## 2011-08-22 NOTE — Progress Notes (Signed)
Physical Therapy Treatment Patient Details Name: THAMARA LEGER MRN: [id number] DOB: 1940/10/19 Today's Date: 08/22/2011  PT Assessment/Plan  PT - Assessment/Plan Comments on Treatment Session: Pt progressing slowly with PT.  States that mobility is limited due to pain and weakness but that her pain is not as bad as it was yesterday.   PT Plan: Discharge plan remains appropriate PT Frequency: Min 6X/week Follow Up Recommendations: Skilled nursing facility Equipment Recommended: Defer to next venue PT Goals  Acute Rehab PT Goals PT Goal: Rolling Supine to Right Side - Progress: Progressing toward goal PT Goal: Supine/Side to Sit - Progress: Progressing toward goal PT Transfer Goal: Sit to Stand/Stand to Sit - Progress: Progressing toward goal PT Goal: Ambulate - Progress: Progressing toward goal  PT Treatment Precautions/Restrictions  Precautions Precautions: Back Precaution Booklet Issued: Yes (comment) Precaution Comments: Reviewed all 3 back precautions Required Braces or Orthoses: Yes Spinal Brace: Lumbar corset;Applied in sitting position Mobility (including Balance) Bed Mobility Bed Mobility: Yes Supine to sit - Left: 1: +2 Total assist Supine to sit - Left Details (indicate cue type and reason): +2 total assist(pt=50%) assistance for bil LE's, lift shoulders/trunk to sitting from sidelying position.   Sit to Supine - Left: 1: +2 Total assist Sit to Supine - Left Details (indicate cue type and reason): +2 total assist(pt=40%); assistance to lift bil LE's onto bed, lower shoulders trunk to sidelying, assistance to prevent twisting; cues for sequencing, technique, hand placement, reinforcement of precautions Transfers Sit to Stand: 1: +2 Total assist;From chair/3-in-1;From bed;With upper extremity assist Sit to Stand Details (indicate cue type and reason): +2 total assist (pt=60%); assistance to achieve standing, balance, steady RW; cues for technique, hand placement,  decrease trunk flexion Stand to Sit: 3: Mod assist;To chair/3-in-1;To bed;With upper extremity assist Stand to Sit Details: assistance to control descent, safety; cues for hand placement, body positioning before sitting, technique Ambulation/Gait Ambulation/Gait: Yes Ambulation/Gait Assistance: 1: +2 Total assist Ambulation/Gait Assistance Details (indicate cue type and reason): +2 total assist(pt=70%); assistance for balance, safety, advancement of RW; cues for sequencing, RW management, upright posture Ambulation Distance (Feet): 10 Feet (5'+5') (distance limited due to pain/weakness per pt) Assistive device: Rolling walker Gait Pattern: Decreased step length - right;Decreased step length - left;Trunk flexed;Decreased weight shift to left;Decreased weight shift to right;Decreased stride length Stairs: No Wheelchair Mobility Wheelchair Mobility: No    Exercise    End of Session PT - End of Session Equipment Utilized During Treatment: Gait belt;Back brace Activity Tolerance: Patient limited by fatigue;Patient limited by pain ( & weakness per pt; pt states pain better today) Patient left: in bed;with call bell in reach General Behavior During Session: [facility] for tasks performed Cognition: [facility] for tasks performed  Lara Mulch 08/22/2011, 11:32 AM

## 2011-08-22 NOTE — Progress Notes (Signed)
Occupational Therapy Evaluation Patient Details Name: Jenny FRANKENFIELD MRN: [id number] DOB: 10/18/1940 Today's Date: 08/22/2011 [id number]  EV2 Problem List:  Patient Active Problem List  Diagnoses  . HYPOTHYROIDISM  . OBESITY  . DYSTHYMIA  . SLEEP APNEA, OBSTRUCTIVE  . HYPERTENSION  . VENOUS INSUFFICIENCY  . ALLERGIC RHINITIS  . BRONCHITIS, RECURRENT  . GERD  . DIVERTICULOSIS OF COLON  . DEGENERATIVE JOINT DISEASE  . BACK PAIN, LUMBAR  . HAND PAIN  . OTHER URINARY INCONTINENCE  . DIABETES MELLITUS, BORDERLINE  . Asthma  . Anaphylaxis due to food  . History of chicken pox  . History of measles  . Intertrigo  . Vitamin D deficiency  . Peripheral neuropathy    Past Medical History:  Past Medical History  Diagnosis Date  . Allergy     rhinitis  . Bronchitis   . Venous insufficiency   . Obesity   . Hypothyroidism   . GERD (gastroesophageal reflux disease)   . Diverticulosis of colon   . Urinary incontinence   . Degenerative joint disease   . Lumbar back pain   . Vitamin D deficiency   . Dysthymia   . Asthma     mild, intermittent  . Transient memory loss March 2006    after a stressful event she had no memory the rest of that day  . VITAMIN D DEFICIENCY 08/21/2008  . VENOUS INSUFFICIENCY 08/23/2007  . Other urinary incontinence 03/12/2010  . OBESITY 08/23/2007  . HYPOTHYROIDISM 08/23/2007  . HAND PAIN 04/09/2010  . GERD 08/23/2007  . 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  . DIVERTICULOSIS OF COLON 02/22/2008  . DIABETES MELLITUS, BORDERLINE 08/21/2008  . DEGENERATIVE JOINT DISEASE 02/22/2008  . BRONCHITIS, RECURRENT 02/22/2008  . BACK PAIN, LUMBAR 08/23/2007  . ALLERGIC RHINITIS 08/23/2007  . Asthma 01/14/2011  . 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 History of chicken pox 01/14/2011  . History of measles 01/14/2011  . PONV (postoperative nausea and vomiting)   . Peripheral neuropathy 02/12/2011    possibly related to spondylolisthesis per pt  . Urinary frequency   . Cough    usually not productive  . Incontinence of urine   . Obstructive sleep apnea     does not use CPAP, lost weight  . SLEEP APNEA, OBSTRUCTIVE 08/23/2007  . Hypertension   . HYPERTENSION 02/22/2008   Past Surgical History:  Past Surgical History  Procedure Date  . Appendectomy   . Tonsillectomy and adenoidectomy   . Ap repair and sling for cystocele 8-04     Dr Patsi Sears  . Left hand 2010    left thumb, joint removal, [facility]  . Ctr     bilateral  . Shoulder arthroscopy distal clavicle excision and open rotator cuff repair     right  . Neck surgery     x 2 - discectomy, fusion  . Abdominal hysterectomy     took one ovary  . Cholecystectomy   . Carpal tunel 2008    OT Assessment/Plan/Recommendation OT Assessment Clinical Impression Statement: Pt s/p lumbar back surgery requiring mod to max assist with some adls.  Pt would benefit from continuted acute OT to increase independence with adls to Mod I level so pt can be home alone. OT Recommendation/Assessment: Patient will need skilled OT in the acute care venue OT Problem List: Decreased strength;Decreased knowledge of use of DME or AE;Decreased knowledge of precautions;Pain Barriers to Discharge: Decreased caregiver support Barriers to Discharge Comments: Feel pt could achieve Mod with post acute rehab OT Therapy  Diagnosis : Generalized weakness;Acute pain OT Plan OT Frequency: Min 2X/week OT Treatment/Interventions: Self-care/ADL training;DME and/or AE instruction OT Recommendation Recommendations for Other Services: Rehab consult Follow Up Recommendations: Inpatient Rehab Equipment Recommended: Defer to next venue Individuals Consulted Consulted and Agree with Results and Recommendations: Patient OT Goals Acute Rehab OT Goals OT Goal Formulation: With patient Time For Goal Achievement: 7 days ADL Goals Pt Will Perform Grooming: with supervision;Standing at sink ADL Goal: Grooming - Progress: Other (comment) Pt Will  Perform Lower Body Bathing: with min assist;Sit to stand from chair;Sitting at sink ADL Goal: Lower Body Bathing - Progress: Other (comment) Pt Will Perform Lower Body Dressing: with min assist;with adaptive equipment;Sit to stand from chair ADL Goal: Lower Body Dressing - Progress: Other (comment) Additional ADL Goal #1: Pt will complete all aspects of toileting with Supervision and 3:1 over commode ADL Goal: Additional Goal #1 - Progress: Other (comment) Additional ADL Goal #2: Pt will gather clothes with walker and Supervision. ADL Goal: Additional Goal #2 - Progress: Other (comment)  OT Evaluation Precautions/Restrictions  Precautions Precautions: Back Precaution Booklet Issued: Yes (comment) Precaution Comments: Reviewed all 3 back precautions Required Braces or Orthoses: Yes Spinal Brace: Lumbar corset;Applied in sitting position Restrictions Weight Bearing Restrictions: No Prior Functioning Home Living Lives With: Daughter;Other (Comment) Receives Help From: Family;Other (Comment) (Is alone some during the day) Type of Home: House Home Layout: One level Home Access: Stairs to enter [employer] of Steps: 1 Bathroom Shower/Tub: Forensic scientist: Standard Bathroom Accessibility: Yes How Accessible: Accessible via walker Home Adaptive Equipment: Bedside commode/3-in-1;Shower chair without back;Reacher;Sock aid;Long-handled sponge;Long-handled shoehorn (has all this equipment from mother that passed away) Additional Comments: Pt lost daughter in car accident 10 years ago, therefore grandson now lives with her. Prior Function Level of Independence: Independent with homemaking with ambulation Able to Take Stairs?: Yes Driving: Yes Vocation: Full time employment Leisure: Hobbies-yes (Comment) Comments: likes to work in yard ADL ADL Eating/Feeding: Performed;Set up Where Assessed - Eating/Feeding: Chair Grooming: Performed;Wash/dry  face;Teeth care;Brushing hair;Supervision/safety Where Assessed - Grooming: Sitting, chair Upper Body Bathing: Simulated;Supervision/safety Where Assessed - Upper Body Bathing: Sitting, chair Lower Body Bathing: Moderate assistance;Simulated Where Assessed - Lower Body Bathing: Sit to stand from chair Upper Body Dressing: Minimal assistance;Other (comment) (cues to wear corsett tighter) Where Assessed - Upper Body Dressing: Sitting, chair Lower Body Dressing: Moderate assistance;Performed Lower Body Dressing Details (indicate cue type and reason): has difficulty reaching feet for adls. Has a reacher and sock aid at home and may benefit from using them secondary to back precautions. Where Assessed - Lower Body Dressing: Sit to stand from chair Toilet Transfer: Performed;Moderate assistance Toilet Transfer Details (indicate cue type and reason): most difficulty coming sit to stand Toilet Transfer Method: Stand pivot Toilet Transfer Equipment: Bedside commode Toileting - Clothing Manipulation: Not assessed Toileting - Hygiene: Minimal assistance Where Assessed - Toileting Hygiene: Sit on 3-in-1 or toilet;Other (comment) (cues to not twist when cleaning self) Tub/Shower Transfer: Not assessed Equipment Used: Rolling walker Vision/Perception    Cognition   Sensation/Coordination   Extremity Assessment RUE Assessment RUE Assessment: Within Functional Limits LUE Assessment LUE Assessment: Within Functional Limits Mobility  Bed Mobility Bed Mobility: Yes Sit to Supine - Right: 2: Max assist Sit to Supine - Right Details (indicate cue type and reason): needed assist to get LEs up onto the bed  Cues given to maintain log rolling Sit to Supine - Left: 1: +2 Total assist Sit to Supine - Left Details (indicate  cue type and reason): +2 total assist(pt=40%); assistance to lift bil LE's onto bed, lower shoulders trunk to sidelying, assistance to prevent twisting; cues for sequencing, technique,  hand placement, reinforcement of precautions Transfers Transfers: Yes Sit to Stand: 3: Mod assist;With upper extremity assist;With armrests;From chair/3-in-1 Sit to Stand Details (indicate cue type and reason): Pt needed assist to get started.  Once up and walker in front of her, pt did very well. Stand to Sit: 3: Mod assist;To chair/3-in-1;To bed;With upper extremity assist Stand to Sit Details: Cues to reach back for bedrail on bed Exercises   End of Session OT - End of Session Activity Tolerance: Patient tolerated treatment well Patient left: in bed;with call bell in reach Nurse Communication: Mobility status for transfers General Behavior During Session: [facility] for tasks performed Cognition: [facility] for tasks performed   Hope Budds  [phone] 08/22/2011, 2:10 PM

## 2011-08-22 NOTE — Progress Notes (Signed)
Pt said to have voided twice today,only about 50cc each, bladder scan done at 2352,scanned about 8108ml,pt requested for a  bedpan,voided about 80cc, in and out cath done,about drained out,pt made comfortable in bed,will continue to monitor. Phil Obasogie-asidi, Rn.

## 2011-08-22 NOTE — Progress Notes (Signed)
  Doing well. C/o appropriate incisional soreness.  No Numbness, tingling, weakness No Nausea /vomiting Starting to Amb / voiding well  Temp:  [97.6 F (36.4 C)-99.7 F (37.6 C)] 98 F (36.7 C) (11/10 0600) Pulse Rate:  [110-120] 112  (11/10 0600) Resp:  [20-24] 20  (11/10 0600) BP: (89-139)/(55-80) 139/80 mmHg (11/10 0600) SpO2:  [95 %-100 %] 95 % (11/10 0600) Good strength and sensation Incision CDI  Plan: Increase activity

## 2011-08-22 NOTE — Progress Notes (Signed)
Per report patient has been strait catheterized for residual urine volume twice since foley removal. Pt has been voiding today.  Pt continues to void frequently and nurse suspects patient not completely emptying bladder.  Bladder scan read 512 cc. Rn placed foley.

## 2011-08-23 NOTE — Progress Notes (Signed)
  NEUROSURGERY PROGRESS NOTE  Doing well. Complains of appropriate back soreness. Some "spasms" in back and upper legs.  No numbness, tingling or weakness Ambulating and voiding ok Good strength and sensation Incision CDI  Temp:  [97.7 F (36.5 C)-98.6 F (37 C)] 98.3 F (36.8 C) (11/11 0542) Pulse Rate:  [101-118] 103  (11/11 0542) Resp:  [20] 20  (11/11 0542) BP: (99-149)/(46-83) 149/80 mmHg (11/11 0542) SpO2:  [89 %-100 %] 99 % (11/11 0542)  Plan: Mobilize, PT/OT, progressing ok  Jenny [name] S 08/23/2011 8:52 AM

## 2011-08-24 ENCOUNTER — Inpatient Hospital Stay (HOSPITAL_COMMUNITY)
Admission: RE | Admit: 2011-08-24 | Discharge: 2011-09-02 | DRG: 946 | Disposition: A | Discharge status: Home Health | Payer: Medicare Other | Source: Ambulatory Visit | Attending: Physical Medicine & Rehabilitation | Admitting: Physical Medicine & Rehabilitation

## 2011-08-24 DIAGNOSIS — E669 Obesity, unspecified: Secondary | ICD-10-CM | POA: Diagnosis present

## 2011-08-24 DIAGNOSIS — M51379 Other intervertebral disc degeneration, lumbosacral region without mention of lumbar back pain or lower extremity pain: Secondary | ICD-10-CM | POA: Diagnosis present

## 2011-08-24 DIAGNOSIS — E559 Vitamin D deficiency, unspecified: Secondary | ICD-10-CM | POA: Diagnosis present

## 2011-08-24 DIAGNOSIS — R339 Retention of urine, unspecified: Secondary | ICD-10-CM

## 2011-08-24 DIAGNOSIS — D649 Anemia, unspecified: Secondary | ICD-10-CM | POA: Diagnosis present

## 2011-08-24 DIAGNOSIS — F341 Dysthymic disorder: Secondary | ICD-10-CM | POA: Diagnosis present

## 2011-08-24 DIAGNOSIS — G4733 Obstructive sleep apnea (adult) (pediatric): Secondary | ICD-10-CM | POA: Diagnosis present

## 2011-08-24 DIAGNOSIS — I10 Essential (primary) hypertension: Secondary | ICD-10-CM | POA: Diagnosis present

## 2011-08-24 DIAGNOSIS — L538 Other specified erythematous conditions: Secondary | ICD-10-CM | POA: Diagnosis present

## 2011-08-24 DIAGNOSIS — J40 Bronchitis, not specified as acute or chronic: Secondary | ICD-10-CM | POA: Diagnosis present

## 2011-08-24 DIAGNOSIS — M5137 Other intervertebral disc degeneration, lumbosacral region: Secondary | ICD-10-CM | POA: Diagnosis present

## 2011-08-24 DIAGNOSIS — Z888 Allergy status to other drugs, medicaments and biological substances status: Secondary | ICD-10-CM

## 2011-08-24 DIAGNOSIS — IMO0002 Reserved for concepts with insufficient information to code with codable children: Secondary | ICD-10-CM

## 2011-08-24 DIAGNOSIS — I872 Venous insufficiency (chronic) (peripheral): Secondary | ICD-10-CM | POA: Diagnosis present

## 2011-08-24 DIAGNOSIS — J449 Chronic obstructive pulmonary disease, unspecified: Secondary | ICD-10-CM | POA: Diagnosis present

## 2011-08-24 DIAGNOSIS — K5730 Diverticulosis of large intestine without perforation or abscess without bleeding: Secondary | ICD-10-CM | POA: Diagnosis present

## 2011-08-24 DIAGNOSIS — K5900 Constipation, unspecified: Secondary | ICD-10-CM | POA: Diagnosis present

## 2011-08-24 DIAGNOSIS — Z886 Allergy status to analgesic agent status: Secondary | ICD-10-CM

## 2011-08-24 DIAGNOSIS — Z5189 Encounter for other specified aftercare: Principal | ICD-10-CM

## 2011-08-24 DIAGNOSIS — Q762 Congenital spondylolisthesis: Secondary | ICD-10-CM

## 2011-08-24 DIAGNOSIS — J42 Unspecified chronic bronchitis: Secondary | ICD-10-CM

## 2011-08-24 DIAGNOSIS — E039 Hypothyroidism, unspecified: Secondary | ICD-10-CM | POA: Diagnosis present

## 2011-08-24 DIAGNOSIS — E876 Hypokalemia: Secondary | ICD-10-CM | POA: Diagnosis present

## 2011-08-24 DIAGNOSIS — J4489 Other specified chronic obstructive pulmonary disease: Secondary | ICD-10-CM | POA: Diagnosis present

## 2011-08-24 DIAGNOSIS — K219 Gastro-esophageal reflux disease without esophagitis: Secondary | ICD-10-CM | POA: Diagnosis present

## 2011-08-24 DIAGNOSIS — M4310 Spondylolisthesis, site unspecified: Secondary | ICD-10-CM | POA: Diagnosis present

## 2011-08-24 DIAGNOSIS — G609 Hereditary and idiopathic neuropathy, unspecified: Secondary | ICD-10-CM | POA: Diagnosis present

## 2011-08-24 DIAGNOSIS — M4316 Spondylolisthesis, lumbar region: Secondary | ICD-10-CM

## 2011-08-24 DIAGNOSIS — Z87891 Personal history of nicotine dependence: Secondary | ICD-10-CM

## 2011-08-24 LAB — DIFFERENTIAL
Basophils Absolute: 0 10*3/uL (ref 0.0–0.1)
Basophils Relative: 0 % (ref 0–1)
Eosinophils Absolute: 0.5 10*3/uL (ref 0.0–0.7)
Eosinophils Relative: 5 % (ref 0–5)
Lymphocytes Relative: 21 % (ref 12–46)
Lymphs Abs: 1.9 10*3/uL (ref 0.7–4.0)
Monocytes Absolute: 1.2 10*3/uL — ABNORMAL HIGH (ref 0.1–1.0)
Monocytes Relative: 13 % — ABNORMAL HIGH (ref 3–12)
Neutro Abs: 5.5 10*3/uL (ref 1.7–7.7)
Neutrophils Relative %: 60 % (ref 43–77)

## 2011-08-24 LAB — URINALYSIS, ROUTINE W REFLEX MICROSCOPIC
Bilirubin Urine: NEGATIVE
Glucose, UA: NEGATIVE mg/dL
Hgb urine dipstick: NEGATIVE
Ketones, ur: NEGATIVE mg/dL
Leukocytes, UA: NEGATIVE
Nitrite: NEGATIVE
Protein, ur: NEGATIVE mg/dL
Specific Gravity, Urine: 1.015 (ref 1.005–1.030)
Urobilinogen, UA: 1 mg/dL (ref 0.0–1.0)
pH: 8 (ref 5.0–8.0)

## 2011-08-24 MED ORDER — DOCUSATE SODIUM 283 MG RE ENEM
1.0000 | ENEMA | Freq: Every day | RECTAL | Status: DC
  Filled 2011-08-24 (×3): qty 1

## 2011-08-24 MED ORDER — PROMETHAZINE HCL 12.5 MG PO TABS
12.5000 mg | ORAL_TABLET | Freq: Four times a day (QID) | ORAL | Status: DC | PRN
  Filled 2011-08-24: qty 1

## 2011-08-24 MED ORDER — SORBITOL 70 % SOLN: 45.0000 mL | Freq: Two times a day (BID) | Status: DC | PRN

## 2011-08-24 MED ORDER — ALUM & MAG HYDROXIDE-SIMETH 400-400-40 MG/5ML PO SUSP
30.0000 mL | ORAL | Status: DC | PRN
  Administered 2011-08-27: 30 mL via ORAL
  Filled 2011-08-24: qty 30

## 2011-08-24 MED ORDER — GUAIFENESIN-DM 100-10 MG/5ML PO SYRP: 5.0000 mL | ORAL_SOLUTION | Freq: Four times a day (QID) | ORAL | Status: DC | PRN

## 2011-08-24 MED ORDER — BISACODYL 10 MG RE SUPP: 10.0000 mg | Freq: Every day | RECTAL | Status: DC | PRN

## 2011-08-24 MED ORDER — BENZONATATE 100 MG PO CAPS
100.0000 mg | ORAL_CAPSULE | Freq: Three times a day (TID) | ORAL | Status: DC | PRN
  Filled 2011-08-24 (×2): qty 1

## 2011-08-24 MED ORDER — POLYETHYLENE GLYCOL 3350 17 G PO PACK
17.0000 g | PACK | Freq: Every day | ORAL | Status: DC
  Filled 2011-08-24 (×3): qty 1

## 2011-08-24 MED ORDER — BUDESONIDE-FORMOTEROL FUMARATE 160-4.5 MCG/ACT IN AERO
1.0000 | INHALATION_SPRAY | Freq: Two times a day (BID) | RESPIRATORY_TRACT | Status: DC
  Administered 2011-08-24 – 2011-09-02 (×18): 1 via RESPIRATORY_TRACT
  Filled 2011-08-24: qty 6

## 2011-08-24 MED ORDER — LEVOTHYROXINE SODIUM 125 MCG PO TABS
125.0000 ug | ORAL_TABLET | Freq: Every day | ORAL | Status: DC
  Administered 2011-08-25 – 2011-09-02 (×9): 125 ug via ORAL
  Filled 2011-08-24 (×12): qty 1

## 2011-08-24 MED ORDER — METHOCARBAMOL 500 MG PO TABS
500.0000 mg | ORAL_TABLET | Freq: Four times a day (QID) | ORAL | Status: DC | PRN
  Administered 2011-08-24 – 2011-09-01 (×8): 500 mg via ORAL
  Filled 2011-08-24 (×8): qty 1

## 2011-08-24 MED ORDER — DIPHENHYDRAMINE HCL 12.5 MG/5ML PO ELIX: 12.5000 mg | ORAL_SOLUTION | Freq: Four times a day (QID) | ORAL | Status: DC | PRN

## 2011-08-24 MED ORDER — ALBUTEROL SULFATE (5 MG/ML) 0.5% IN NEBU: 2.5000 mg | INHALATION_SOLUTION | Freq: Four times a day (QID) | RESPIRATORY_TRACT | Status: DC | PRN

## 2011-08-24 MED ORDER — NYSTATIN 100000 UNIT/GM EX CREA
TOPICAL_CREAM | Freq: Two times a day (BID) | CUTANEOUS | Status: DC | PRN
  Filled 2011-08-24: qty 15

## 2011-08-24 MED ORDER — PROMETHAZINE HCL 12.5 MG RE SUPP: 12.5000 mg | Freq: Four times a day (QID) | RECTAL | Status: DC | PRN

## 2011-08-24 MED ORDER — TRAMADOL HCL 50 MG PO TABS
50.0000 mg | ORAL_TABLET | Freq: Four times a day (QID) | ORAL | Status: DC | PRN
  Administered 2011-08-28 – 2011-08-31 (×2): 50 mg via ORAL
  Filled 2011-08-24 (×2): qty 1

## 2011-08-24 MED ORDER — PHENOL 1.4 % MT LIQD
1.0000 | OROMUCOSAL | Status: DC | PRN
  Filled 2011-08-24: qty 177

## 2011-08-24 MED ORDER — PROMETHAZINE HCL 25 MG/ML IJ SOLN: 12.5000 mg | Freq: Four times a day (QID) | INTRAMUSCULAR | Status: DC | PRN

## 2011-08-24 MED ORDER — OXYCODONE-ACETAMINOPHEN 5-325 MG PO TABS
1.0000 | ORAL_TABLET | ORAL | Status: DC | PRN
  Administered 2011-08-24 – 2011-09-02 (×11): 1 via ORAL
  Filled 2011-08-24 (×14): qty 1

## 2011-08-24 MED ORDER — FUROSEMIDE 40 MG PO TABS
40.0000 mg | ORAL_TABLET | Freq: Every day | ORAL | Status: DC
  Administered 2011-08-25 – 2011-08-31 (×7): 40 mg via ORAL
  Filled 2011-08-24 (×10): qty 1

## 2011-08-24 MED ORDER — FAMOTIDINE 10 MG PO TABS
10.0000 mg | ORAL_TABLET | Freq: Every day | ORAL | Status: DC
  Administered 2011-08-25 – 2011-09-02 (×9): 10 mg via ORAL
  Filled 2011-08-24 (×11): qty 1

## 2011-08-24 MED ORDER — MENTHOL 3 MG MT LOZG
1.0000 | LOZENGE | OROMUCOSAL | Status: DC | PRN
  Filled 2011-08-24: qty 9

## 2011-08-24 MED ORDER — TRAZODONE HCL 50 MG PO TABS
25.0000 mg | ORAL_TABLET | Freq: Every evening | ORAL | Status: DC | PRN
  Administered 2011-08-28: 50 mg via ORAL
  Filled 2011-08-24: qty 1

## 2011-08-24 MED ORDER — ALBUTEROL SULFATE HFA 108 (90 BASE) MCG/ACT IN AERS
2.0000 | INHALATION_SPRAY | Freq: Four times a day (QID) | RESPIRATORY_TRACT | Status: DC | PRN
  Filled 2011-08-24: qty 6.7

## 2011-08-24 MED ORDER — ALPRAZOLAM 0.5 MG PO TABS
0.5000 mg | ORAL_TABLET | Freq: Three times a day (TID) | ORAL | Status: DC | PRN
  Administered 2011-08-25: 0.5 mg via ORAL
  Filled 2011-08-24 (×2): qty 1

## 2011-08-24 MED ORDER — ACETAMINOPHEN 650 MG RE SUPP: 650.0000 mg | RECTAL | Status: DC | PRN

## 2011-08-24 MED ORDER — BENZONATATE 100 MG PO CAPS
100.0000 mg | ORAL_CAPSULE | Freq: Three times a day (TID) | ORAL | Status: DC | PRN
  Administered 2011-08-24 – 2011-08-27 (×4): 100 mg via ORAL
  Filled 2011-08-24: qty 1

## 2011-08-24 MED ORDER — ACETAMINOPHEN 325 MG PO TABS
650.0000 mg | ORAL_TABLET | ORAL | Status: DC | PRN
  Administered 2011-08-28 – 2011-09-01 (×4): 650 mg via ORAL
  Filled 2011-08-24 (×4): qty 2

## 2011-08-24 MED ORDER — FLEET ENEMA 7-19 GM/118ML RE ENEM
1.0000 | ENEMA | Freq: Once | RECTAL | Status: DC
  Filled 2011-08-24: qty 1

## 2011-08-24 MED ORDER — SORBITOL 70 % SOLN: 45.0000 mL | Freq: Once | Status: DC

## 2011-08-24 NOTE — Plan of Care (Signed)
Problem: RH PAIN MANAGEMENT Goal: RH STG PAIN MANAGED AT OR BELOW PT'S PAIN GOAL At pt's stated goal <3

## 2011-08-24 NOTE — H&P (Signed)
Physical Medicine and Rehabilitation Admission H&P CC: Back pain  HPI: Jenny [name] is an 70 y.o. 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 Patient elected to undergo bilateral L3-L5 laminectomies with facetectomies and fusion by Dr. Jeral Fruit on 08/19/2011. Post op with back pain and difficulty with RLE coordiantion and needs maximal cues for posture. MD recommending CIR.   Review of Systems  HENT: Negative.  Eyes: Negative.  Respiratory: Positive for cough (chronic.).  Cardiovascular: Negative.  Gastrointestinal: Negative.  Genitourinary: Negative.  Musculoskeletal: Positive for myalgias and back pain.  Spasms BLE.  Neurological: Positive for sensory change (numbness bilateral hands.) and weakness.  Endo/Heme/Allergies: Bruises/bleeds easily.  Psychiatric/Behavioral: Negative.  All other systems reviewed and are negative.  :   Past Medical History  Diagnosis Date  . Allergy     rhinitis  . Bronchitis   . Venous insufficiency   . Obesity   . Hypothyroidism   . GERD (gastroesophageal reflux disease)   . Diverticulosis of colon   . Urinary incontinence   . Degenerative joint disease   . Lumbar back pain   . Vitamin D deficiency   . Dysthymia   . Asthma     mild, intermittent  . Transient memory loss March 2006    after a stressful event she had no memory the rest of that day  . VITAMIN D DEFICIENCY 08/21/2008  . VENOUS INSUFFICIENCY 08/23/2007  . Other urinary incontinence 03/12/2010  . OBESITY 08/23/2007  . HYPOTHYROIDISM 08/23/2007  . HAND PAIN 04/09/2010  . GERD 08/23/2007  . 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  . DIVERTICULOSIS OF COLON 02/22/2008  . DIABETES MELLITUS, BORDERLINE 08/21/2008  . DEGENERATIVE JOINT DISEASE 02/22/2008  . BRONCHITIS, RECURRENT 02/22/2008  . BACK PAIN, LUMBAR 08/23/2007  . ALLERGIC RHINITIS 08/23/2007  . Asthma  01/14/2011  . 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 History of chicken pox 01/14/2011  . History of measles 01/14/2011  . PONV (postoperative nausea and vomiting)   . Peripheral neuropathy 02/12/2011    possibly related to spondylolisthesis per pt  . Urinary frequency   . Cough     usually not productive  . Incontinence of urine   . Obstructive sleep apnea     does not use CPAP, lost weight  . SLEEP APNEA, OBSTRUCTIVE 08/23/2007  . Hypertension   . HYPERTENSION 02/22/2008   Past Surgical History  Procedure Date  . Appendectomy   . Tonsillectomy and adenoidectomy   . Ap repair and sling for cystocele 8-04     Dr Patsi Sears  . Left hand 2010    left thumb, joint removal, [facility]  . Ctr     bilateral  . Shoulder arthroscopy distal clavicle excision and open rotator cuff repair     right  . Neck surgery     x 2 - discectomy, fusion  . Abdominal hysterectomy     took one ovary  . Cholecystectomy   . Carpal tunel 2008   Family History  Problem Relation Age of Onset  . COPD Mother   . Pneumonia Mother   . Other Mother     CHF  . Hypertension Mother   . Arthritis Mother     septic knee after replacement  . Heart disease Mother     chf  . Dementia Father   . Arthritis Brother   . Asthma Daughter   . Cancer  Maternal Aunt     breast  . Stroke Maternal Grandmother   . Heart disease Maternal Grandmother   . Other Maternal Grandfather     CHF  . Heart disease Maternal Grandfather     chf  . Cancer Paternal Grandmother     stomach?  . Other Paternal Grandfather     problems with kidneys  . Kidney disease Paternal Grandfather   . Anxiety disorder Daughter   . Cancer Maternal Aunt     liver   Social History: Lives with family. 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 Rereports that she quit smoking about 20+ years ago. Her smoking use included Cigarettes. She has a 45 pack-year smoking history. She has never used smokeless tobacco. She reports that she does  not drink alcohol or use illicit drugs. Has a supportive family...another daughter can provide supervision at home past discharge.     Allergies  Allergen Reactions  . Codeine     REACTION: nausea  . Prednisone     "I forget who I am"  Medrol is ok    Medications Prior to Admission  Medication Sig Dispense Refill  . albuterol (PROVENTIL HFA;VENTOLIN HFA) 108 (90 BASE) MCG/ACT inhaler Inhale 2 puffs into the lungs every 6 (six) hours as needed. For wheezing       . budesonide-formoterol (SYMBICORT) 160-4.5 MCG/ACT inhaler Inhale 1 puff into the lungs 2 (two) times daily.        . calcium carbonate (OS-CAL) 600 MG TABS Take 600 mg by mouth 2 (two) times daily with a meal.       . clotrimazole-betamethasone (LOTRISONE) cream Apply 1 application topically 2 (two) times daily as needed. Rash, itching       . famotidine (PEPCID) 10 MG tablet Take 10 mg by mouth 2 (two) times daily as needed. For indigestion      . guaiFENesin (MUCINEX) 600 MG 12 hr tablet Take 1,200 mg by mouth 2 (two) times daily.        Marland Kitchen nystatin (MYCOSTATIN) ointment Apply 1 application topically 2 (two) times daily as needed. For rash      . potassium chloride SA (K-DUR,KLOR-CON) 20 MEQ tablet Take 20 mEq by mouth daily as needed. With lasix; for swelling       . ALPRAZolam (XANAX) 0.5 MG tablet Take 0.5 mg by mouth 3 (three) times daily as needed. For anxiety      . benzonatate (TESSALON PERLES) 100 MG capsule Take 1 capsule (100 mg total) by mouth 3 (three) times daily as needed for cough.  30 capsule  0  . Cholecalciferol (VITAMIN D) 1000 UNITS capsule Take 1,000 Units by mouth daily.       . furosemide (LASIX) 40 MG tablet Take 40 mg by mouth daily as needed. For swelling           Home:  Home Living  Lives With: Daughter;Other (Comment) (and grandson)  Receives Help From: (Pt. 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  Stand to Sit: 4: Min assist Stand to Sit Details: assistance to control descent Ambulation/Gait Ambulation/Gait: Yes Ambulation/Gait Assistance: 4: Min assist Ambulation/Gait Assistance Details (indicate cue type and reason): assistance for balance & safety, cues to increase/maintain upright posture.  Ambulation Distance (Feet): 70 Feet Assistive device: Rolling walker Gait Pattern: Step-through pattern;Decreased step length - right;Decreased step length - left;Trunk flexed Stairs: No Wheelchair Mobility Wheelchair Mobility: No            ADL:  mod assist with self-care and basic transfers. Has foley catheter in currently  Cognition: within normal limits        There were no vitals taken for this visit.  Physical Exam  Constitutional: She is oriented to person, place, and time.       Obese female, NAD.  HENT:  Head: Normocephalic and atraumatic.  Mouth/Throat: Abnormal dentition.  Eyes: Lids are normal. Pupils are equal, round, and reactive to light.  Neck: Normal range of motion.  Cardiovascular: Normal rate and regular rhythm.   Pulmonary/Chest: She has wheezes (upper airway.).       Congested cough occasionally....chronic per report.  Abdominal: Soft.       Decreased bowel sounds.  Nontender.  Musculoskeletal:  Normal range of motion.  Neurological: She is alert and oriented to person, place, and time. A sensory deficit is present.       Reports decreased sensation bilateral hands.  Skin: Skin is warm and dry.          Back incision clean, dry and intact with steri strips in place.  Ecchymosis mid back and BUE.  Psychiatric: She has a normal mood and affect. Her speech is normal.     Post Admission Physician Evaluation: 1. Functional deficits secondary  To Lumbar stenosis L4-S1 with spondylolisthesis. 2. Patient admitted to receive collaborative, interdisciplinary care between the physiatrist, rehab nursing staff, and therapy team. 3. Patient's level of medical complexity and substantial therapy needs in context of that medical necessity cannot be provided at a lesser intensity of care. 4. Patient has experienced substantial functional loss from his/her baseline. Please see above functional history and functional status to see premorbid and current functional levels. Judging by the patient's diagnosis, physical exam, and functional history, the patient has potential for functional progress which will result in measurable gains while on inpatient rehab.  These gains will be of substantial and practical use upon discharge in facilitating mobility and self-care at the household level. 5. Physiatrist will provide 24 hour management of medical needs as well as oversight of the therapy plan/treatment and provide guidance as appropriate regarding the interaction of the two. 6. 24 hour rehab nursing will assist in the management of  bladder management, bowel management, safety, skin/wound care, medication administration, pain management and patient education  and help integrate therapy concepts, techniques,education, etc. 7. PT will assess and treat for: balance, bathing, dressing, endurance, feeding, grooming, locomotion, toileting and transferring. Goals are: independent with assistive device. 8. OT will  assess and treat for: bathing, bowel/bladder control, dressing, toileting and transferring .  Goals are: supervision.  9. SLP will assess and treat for: not applicable.  Goals are: N/A. 10. Case Management and Social Worker will assess and treat for psychological issues and  discharge planning. 11. Team conference will be held weekly to assess progress toward goals and to determine barriers to discharge. 12.  Patient will receive at least 3 hours of therapy per day at least 5 days per week. 13. ELOS and Prognosis: 1-2 weeks excellent   Medical Problem List and Plan: 1. DVT Prophylaxis/Anticoagulation: Mechanical:  Antiembolism stockings, knee (TED hose) Bilateral lower extremities Sequential compression devices, below knee Bilateral lower extremities.  ? Add lovenox as 5 days post op. 2. Pain Management:  Prn oxycodone.   May need schedule percocet prior to therapies.  D/C valium and use robaxin prn for spasms. 3. Mood: Motivated to get better.  Monitor for now.  Patient Active Hospital Problem List: 4. Asthma/Bronchitis: Resume symbicort.  Monitor respiratory status for now.  Use albuterol MDI prn SOB/  5. Urinary retention:  Foley replaced 11/10.  Check UA/UCS.  ? Neurogenic bladder.   Needs to get onto [facility] to be able to empty.  Toilet every 4 hours once foley d/c in am.  6. Hypothyroid:  Continue supplemental synthyroid  7.GERD: On scheduled pepcid.  Increased GI symtoms currently due to decreased moblility.  Needs to be upright for meals.  8. Hypertension:  Monitor on lasix daily.  BID checks  9.  Hyperkalemia:  Off supplement currently.  Recheck lytes in am.    10.  Constipation:  Sorbitol past admission.  Repeat enema if no results.  Reports no BM since surgery.  Schedule miralax daily in am.   SWARTZ,ZACHARY T 08/24/2011, 4:03 PM

## 2011-08-24 NOTE — Progress Notes (Signed)
Overall Plan of Care [facility]) Patient Details Name: Jenny [name] MRN: [id number] DOB: [birth year]  Diagnosis:  Lumbar spondylosis with radiculopathy  Primary Diagnosis:    <principal problem not specified> Co-morbidities: morbid obestiy, anemia, urinary retention, pain  Functional Problem List  Patient demonstrates impairments in the following areas: Balance, Bladder, Endurance, Motor, Pain, Safety and Skin Integrity  Basic ADL's: grooming, bathing, dressing, toileting and tub shower transfers Advanced ADL's: simple meal preparation  Transfers:  bed mobility, bed to chair, toilet, tub/shower, car and furniture Locomotion:  ambulation, wheelchair mobility and stairs  Additional Impairments:  Other (n/a)  Anticipated Outcomes Item Anticipated Outcome  Eating/Swallowing    Basic self-care  Overall Mod I except supervision for shower transfers  Tolieting  Mod I  Bowel/Bladder  Mod Independent  Transfers  Modified independent  Locomotion  Modified independent  Communication    Cognition    Pain  < than 3  Safety/Judgment    Other  Don/doff brace at EOB independently   Therapy Plan:         Team Interventions: Item RN PT OT SLP SW TR Other  Self Care/Advanced ADL Retraining  x x      Neuromuscular Re-Education         Therapeutic Activities  x x   x   UE/LE Strength Training/ROM  x x      UE/LE Coordination Activities   x      Visual/Perceptual Remediation/Compensation         DME/Adaptive Equipment Instruction  x x   x   Therapeutic Exercise  x x   x   Balance/Vestibular Training  x x      Patient/Family Education x x x   x   Cognitive Remediation/Compensation         Functional Mobility Training  x x   x   Ambulation/Gait Training  x x      Stair Training  x       Wheelchair Propulsion/Positioning  x       Functional Tourist information centre manager Reintegration  x x   x   Dysphagia/Aspiration Musician         Bladder Management x        Bowel Management x        Disease Management/Prevention         Pain Management x  x      Medication Management         Skin Care/Wound Management x  x      Splinting/Orthotics  x       Discharge Planning   x  x    Psychosocial Support     x                       Team Discharge Planning: Destination:  Home Projected Follow-up:  OT:  TBD PT: TBD Projected Equipment Needs:  Tub Bench Patient/family involved in discharge planning: Patient involved; Yes  MD ELOS: 7-10 days Medical Rehab Prognosis:  Excellent Assessment: Patient is admitted for CIR therapies.  PT will see this patient at least 1-2 hours per day at least 5 days a week addressing, lower extremity strength, balance, locomotion, brace wear and adherence to back precautions, and pain mgt with modified independent goals.  OT will see patient at least 1-2 hours per day at least 5 days a  week to address self-care, fxnl mobility, adaptive equipment training, safety with supervision to modified independent goals.  Pt is quite motivated!

## 2011-08-24 NOTE — Progress Notes (Signed)
Physical Therapy Treatment Patient Details Name: Jenny [name] MRN: [id number] DOB: 1941-06-01 Today's Date: 08/24/2011  PT Assessment/Plan  PT - Assessment/Plan Comments on Treatment Session: Pt states she's feeling better today. Progressing with PT goals.  Required decreased assistance with all mobility.  Pain still seems to be limiting factor in mobility per pt.  Per chart review, pt is a candidate for CIR & possibly D/Cing to CIR today- D/C plans updated.   PT Plan: Discharge plan needs to be updated PT Frequency: Min 6X/week Follow Up Recommendations: Inpatient Rehab Equipment Recommended: Defer to next venue PT Goals  Acute Rehab PT Goals PT Transfer Goal: Sit to Stand/Stand to Sit - Progress: Progressing toward goal PT Goal: Ambulate - Progress: Progressing toward goal  PT Treatment Precautions/Restrictions  Precautions Precautions: Back Precaution Booklet Issued: Yes (comment) Precaution Comments: Reviewed all 3 back precautions Required Braces or Orthoses: Yes Spinal Brace: Lumbar corset;Applied in sitting position Restrictions Weight Bearing Restrictions: No Mobility (including Balance) Transfers Transfers: Yes Sit to Stand: 3: Mod assist Sit to Stand Details (indicate cue type and reason): assistance to achieve standing, balance, safety.   Stand to Sit: 4: Min assist Stand to Sit Details: assistance to control descent Ambulation/Gait Ambulation/Gait: Yes Ambulation/Gait Assistance: 4: Min assist Ambulation/Gait Assistance Details (indicate cue type and reason): assistance for balance & safety, cues to increase/maintain upright posture.   Ambulation Distance (Feet): 70 Feet Assistive device: Rolling walker Gait Pattern: Step-through pattern;Decreased step length - right;Decreased step length - left;Trunk flexed Stairs: No Wheelchair Mobility Wheelchair Mobility: No    Exercise    End of Session PT - End of Session Equipment Utilized During Treatment: Gait  belt;Back brace Activity Tolerance: Patient tolerated treatment well Patient left: in chair Nurse Communication: Mobility status for transfers;Mobility status for ambulation General Behavior During Session: [facility] for tasks performed Cognition: [facility] for tasks performed  Lara Mulch 08/24/2011, 1:47 PM

## 2011-08-24 NOTE — PMR Pre-admission (Signed)
PMR Admission Coordinator Pre-Admission Assessment  Patient:  Jenny [name] is an 70 y.o., female MRN:  [id number] DOB:  06-15-1941 Height:  Height: 5\' 5"  (165.1 cm) Weight:  Weight: 107.639 kg (237 lb 4.8 oz) S.S.N #[id number]  Insurance Information: PRIMARY:Medicare A/B      Policy#:[id number]      Subscriber:Raziyah Voisin  Employer:Retired Benefits:  Phone #:      Name:[employer] Eff. Date:11/13/03 (A) 07/12/04 (B)  Deduct:$1156  OOP WUX:LKGM Life WNU:UVOZ CIR:100%      SNF:100 days    LBD=05/08/11 Outpatient:80%     Co-Pay:20% Home Health:100%      Co-Pay:none DME:80%     Co-Pay:20% Providers:patient's choice  SECONDARY:[employer]      Policy#:[id number]      Subscriber:self CM Name:      Phone#:      Fax#:  Pre-Cert#:       Employer:  Benefits:  Phone #: [phone]     Name: Eff. Date:      Deduct:       Out of Pocket Max:       Life Max:  CIR:       SNF:  Outpatient:      Co-Pay:  Home Health:       Co-Pay:  DME:      Co-Pay:   Current Medical History:   Patient Admitting Diagnosis:Lumbar spondylolithesis with radiculopathy, lumbar fusion History of Present Illness: Underwent L 3-5 fusion by Dr. Jeral Fruit 11-07/12.  Continues to have back and buttock pain.   Patients Past Medical History:   Past Medical History  Diagnosis Date  . Allergy     rhinitis  . Bronchitis   . Venous insufficiency   . Obesity   . Hypothyroidism   . GERD (gastroesophageal reflux disease)   . Diverticulosis of colon   . Urinary incontinence   . Degenerative joint disease   . Lumbar back pain   . Vitamin D deficiency   . Dysthymia   . Asthma     mild, intermittent  . Transient memory loss March 2006    after a stressful event she had no memory the rest of that day  . VITAMIN D DEFICIENCY 08/21/2008  . VENOUS INSUFFICIENCY 08/23/2007  . Other urinary incontinence 03/12/2010  . OBESITY 08/23/2007  . HYPOTHYROIDISM 08/23/2007  . HAND PAIN 04/09/2010  . GERD 08/23/2007  . DYSTHYMIA  03/06/2008  . DIVERTICULOSIS OF COLON 02/22/2008  . DIABETES MELLITUS, BORDERLINE 08/21/2008  . DEGENERATIVE JOINT DISEASE 02/22/2008  . BRONCHITIS, RECURRENT 02/22/2008  . BACK PAIN, LUMBAR 08/23/2007  . ALLERGIC RHINITIS 08/23/2007  . Asthma 01/14/2011  . 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 History of chicken pox 01/14/2011  . History of measles 01/14/2011  . PONV (postoperative nausea and vomiting)   . Peripheral neuropathy 02/12/2011    possibly related to spondylolisthesis per pt  . Urinary frequency   . Cough     usually not productive  . Incontinence of urine   . Obstructive sleep apnea     does not use CPAP, lost weight  . SLEEP APNEA, OBSTRUCTIVE 08/23/2007  . Hypertension   . HYPERTENSION 02/22/2008   Family Medical History:  family history includes Anxiety disorder in her daughter; Arthritis in her brother and mother; Asthma in her daughter; COPD in her mother; Cancer in her maternal aunts and paternal grandmother; Dementia in her father; Heart disease in her maternal grandfather, maternal grandmother, and mother; Hypertension in her mother; Kidney disease in her paternal grandfather;  Other in her maternal grandfather, mother, and paternal grandfather; Pneumonia in her mother; and Stroke in her maternal grandmother. NIH Stroke scale:  Height and Weight Height: 5\' 5"  (165.1 cm) Weight: 107.639 kg (237 lb 4.8 oz) BSA (Calculated - sq m): 2.22 sq meters BMI (Calculated): 39.6  Weight in (lb) to have BMI = 25: 149.9  Glascow Coma Scale:     Prior Rehab/Hospitalizations:   Medications PTA Medications:   Medications Prior to Admission  Medication Dose Route Frequency Provider Last Rate Last Dose  . acetaminophen (TYLENOL) tablet 650 mg  650 mg Oral Q4H PRN Tanya Nones Botero       Or  . acetaminophen (TYLENOL) suppository 650 mg  650 mg Rectal Q4H PRN Karn Cassis      . albuterol (PROVENTIL HFA;VENTOLIN HFA) 108 (90 BASE) MCG/ACT inhaler 2 puff  2 puff Inhalation Q6H PRN Tanya Nones Botero   2 puff at 08/21/11 1642  . benzonatate (TESSALON) capsule 100 mg  100 mg Oral TID PRN Karn Cassis      . ceFAZolin (ANCEF) IVPB 1 g/50 mL premix  1 g Intravenous 60 min Pre-Op Tanya Nones Botero   2 g at 08/19/11 1191  . ceFAZolin (ANCEF) IVPB 1 g/50 mL premix  1 g Intravenous Q8H Ernesto M Botero   1 g at 08/20/11 4782  . diazepam (VALIUM) tablet 5 mg  5 mg Oral Q6H PRN Tanya Nones Botero   5 mg at 08/23/11 1658  . docusate sodium (COLACE) capsule 100 mg  100 mg Oral BID Tanya Nones Botero   100 mg at 08/24/11 9562  . docusate sodium (ENEMEEZ) enema 283 mg  1 enema Rectal Daily Tanya Nones Botero   283 mg at 08/23/11 1133  . famotidine (PEPCID) tablet 10 mg  10 mg Oral Daily Gary Fleet Abbott, PHARMD   10 mg at 08/24/11 0957  . furosemide (LASIX) tablet 40 mg  40 mg Oral Daily Tanya Nones Botero   40 mg at 08/24/11 0957  . influenza  inactive virus vaccine (FLUZONE/FLUARIX) injection 0.5 mL  0.5 mL Intramuscular Tomorrow-1000 Scarlette Shorts, RN   0.5 mL at 08/20/11 1610  . levothyroxine (SYNTHROID, LEVOTHROID) tablet 125 mcg  125 mcg Oral Daily Tanya Nones Botero   125 mcg at 08/24/11 1308  . menthol-cetylpyridinium (CEPACOL) lozenge 3 mg  1 lozenge Oral PRN Tanya Nones Botero       Or  . phenol (CHLORASEPTIC) mouth spray 1 spray  1 spray Mouth/Throat PRN Karn Cassis      . ondansetron (ZOFRAN) injection 4 mg  4 mg Intravenous Once Judie Petit, MD   4 mg at 08/19/11 1300  . ondansetron (ZOFRAN) injection 4 mg  4 mg Intravenous Q4H PRN Tanya Nones Botero      . oxyCODONE-acetaminophen (PERCOCET) 5-325 MG per tablet 1 tablet  1 tablet Oral Q4H PRN Tanya Nones Botero   1 tablet at 08/24/11 6578  . DISCONTD: 0.9 %  sodium chloride infusion  250 mL Intravenous Continuous Tanya Nones Botero      . DISCONTD: 0.9 % NaCl with KCl 20 mEq/ L  infusion   Intravenous Continuous Tanya Nones Botero 75 mL/hr at 08/20/11 1618    . DISCONTD: bupivacaine liposome (EXPAREL) 1.3 % injection 266 mg  20  mL Infiltration Once Karn Cassis      . DISCONTD: ceFAZolin (ANCEF) IVPB 1 g/50 mL premix  1 g Intravenous 60 min Pre-Op Tanya Nones Botero      .  DISCONTD: diazepam (VALIUM) tablet 5 mg  5 mg Oral Q6H PRN Tanya Nones Botero      . DISCONTD: diphenhydrAMINE (BENADRYL) 12.5 MG/5ML elixir 12.5 mg  12.5 mg Oral Q6H PRN Tanya Nones Botero      . DISCONTD: diphenhydrAMINE (BENADRYL) injection 12.5 mg  12.5 mg Intravenous Q6H PRN Tanya Nones Botero      . DISCONTD: famotidine (PEPCID) tablet 10 mg  10 mg Oral Daily Tanya Nones Botero   10 mg at 08/19/11 2232  . DISCONTD: HYDROmorphone (DILAUDID) injection 0.25-0.5 mg  0.25-0.5 mg Intravenous Q5 min PRN Judie Petit, MD   0.5 mg at 08/20/11 0351  . DISCONTD: HYDROmorphone (DILAUDID) injection 0.25-0.5 mg  0.25-0.5 mg Intravenous Q5 min PRN Judie Petit, MD      . DISCONTD: HYDROmorphone (DILAUDID) injection 0.25-0.5 mg  0.25-0.5 mg Intravenous Q5 min PRN Judie Petit, MD      . DISCONTD: influenza  inactive virus vaccine (FLUZONE/FLUARIX) injection 0.25 mL  0.25 mL Intramuscular Tomorrow-1000 Scarlette Shorts, RN      . DISCONTD: lactated ringers infusion   Intravenous Continuous Judie Petit, MD      . DISCONTD: morphine 1 MG/ML PCA injection   Intravenous Q4H Tanya Nones Botero   6 mg at 08/21/11 1200  . DISCONTD: morphine 2 MG/ML injection 0.05 mg/kg  0.05 mg/kg Intravenous Q10 min PRN Judie Petit, MD      . DISCONTD: morphine 2 MG/ML injection 0.05 mg/kg  0.05 mg/kg Intravenous Q10 min PRN Judie Petit, MD      . DISCONTD: morphine 2 MG/ML injection 0.05 mg/kg  0.05 mg/kg Intravenous Q10 min PRN Judie Petit, MD      . DISCONTD: naloxone [facility]) injection 0.4 mg  0.4 mg Intravenous PRN Karn Cassis      . DISCONTD: ondansetron (ZOFRAN) injection 4 mg  4 mg Intravenous Q6H PRN Tanya Nones Botero      . DISCONTD: oxyCODONE (Oxy IR/ROXICODONE) immediate release tablet 10 mg  10 mg Oral Q4H PRN Tanya Nones Botero   10 mg at  08/22/11 1432  . DISCONTD: sodium chloride 0.9 % injection 3 mL  3 mL Intravenous Q12H Tanya Nones Botero   3 mL at 08/20/11 2200  . DISCONTD: sodium chloride 0.9 % injection 3 mL  3 mL Intravenous PRN Tanya Nones Botero      . DISCONTD: sodium chloride 0.9 % injection 9 mL  9 mL Intravenous PRN Tanya Nones Botero      . DISCONTD: Surgifoam 100 with Thrombin 20,000 units (20 ml) topical solution    PRN Karn Cassis      . DISCONTD: thrombin spray    PRN Karn Cassis       Medications Prior to Admission  Medication Sig Dispense Refill  . albuterol (PROVENTIL HFA;VENTOLIN HFA) 108 (90 BASE) MCG/ACT inhaler Inhale 2 puffs into the lungs every 6 (six) hours as needed. For wheezing       . budesonide-formoterol (SYMBICORT) 160-4.5 MCG/ACT inhaler Inhale 1 puff into the lungs 2 (two) times daily.        . calcium carbonate (OS-CAL) 600 MG TABS Take 600 mg by mouth 2 (two) times daily with a meal.       . clotrimazole-betamethasone (LOTRISONE) cream Apply 1 application topically 2 (two) times daily as needed. Rash, itching       . famotidine (PEPCID) 10 MG tablet Take 10 mg by mouth 2 (two) times daily as needed. For indigestion      .  guaiFENesin (MUCINEX) 600 MG 12 hr tablet Take 1,200 mg by mouth 2 (two) times daily.        Marland Kitchen nystatin (MYCOSTATIN) ointment Apply 1 application topically 2 (two) times daily as needed. For rash      . potassium chloride SA (K-DUR,KLOR-CON) 20 MEQ tablet Take 20 mEq by mouth daily as needed. With lasix; for swelling       . ALPRAZolam (XANAX) 0.5 MG tablet Take 0.5 mg by mouth 3 (three) times daily as needed. For anxiety      . benzonatate (TESSALON PERLES) 100 MG capsule Take 1 capsule (100 mg total) by mouth 3 (three) times daily as needed for cough.  30 capsule  0  . Cholecalciferol (VITAMIN D) 1000 UNITS capsule Take 1,000 Units by mouth daily.       . furosemide (LASIX) 40 MG tablet Take 40 mg by mouth daily as needed. For swelling         Current Medications:  Current facility-administered medications:acetaminophen (TYLENOL) suppository 650 mg, 650 mg, Rectal, Q4H PRN, Tanya Nones Botero;  acetaminophen (TYLENOL) tablet 650 mg, 650 mg, Oral, Q4H PRN, Tanya Nones Botero;  albuterol (PROVENTIL HFA;VENTOLIN HFA) 108 (90 BASE) MCG/ACT inhaler 2 puff, 2 puff, Inhalation, Q6H PRN, Karn Cassis, 2 puff at 08/21/11 1642;  benzonatate (TESSALON) capsule 100 mg, 100 mg, Oral, TID PRN, Tanya Nones Botero diazepam (VALIUM) tablet 5 mg, 5 mg, Oral, Q6H PRN, Tanya Nones Botero, 5 mg at 08/23/11 1658;  docusate sodium (COLACE) capsule 100 mg, 100 mg, Oral, BID, Ernesto M Botero, 100 mg at 08/24/11 1610;  docusate sodium (ENEMEEZ) enema 283 mg, 1 enema, Rectal, Daily, Tanya Nones Botero, 283 mg at 08/23/11 1133;  famotidine (PEPCID) tablet 10 mg, 10 mg, Oral, Daily, Gary Fleet Abbott, PHARMD, 10 mg at 08/24/11 0957 furosemide (LASIX) tablet 40 mg, 40 mg, Oral, Daily, Tanya Nones Botero, 40 mg at 08/24/11 9604;  levothyroxine (SYNTHROID, LEVOTHROID) tablet 125 mcg, 125 mcg, Oral, Daily, Karn Cassis, 125 mcg at 08/24/11 5409;  menthol-cetylpyridinium (CEPACOL) lozenge 3 mg, 1 lozenge, Oral, PRN, Tanya Nones Botero;  ondansetron [facility]) injection 4 mg, 4 mg, Intravenous, Q4H PRN, Tanya Nones Botero oxyCODONE-acetaminophen (PERCOCET) 5-325 MG per tablet 1 tablet, 1 tablet, Oral, Q4H PRN, Karn Cassis, 1 tablet at 08/24/11 8119;  phenol (CHLORASEPTIC) mouth spray 1 spray, 1 spray, Mouth/Throat, PRN, Karn Cassis  Additional Precautions/Restrictions: Precautions Precautions: Back Precaution Booklet Issued: Yes (comment) Precaution Comments: Reviewed all 3 back precautions Required Braces or Orthoses: Yes Spinal Brace: Lumbar corset;Applied in sitting position Restrictions Weight Bearing Restrictions: No  Therapy Assessments Cognition Arousal/Alertness: Awake/alert Overall Cognitive Status: Appears within functional limits for tasks assessed Orientation Level: Oriented  X4 Cognition Arousal/Alertness: Awake/alert Orientation Level: Oriented X4 Home Living Lives With: Daughter;Other (Comment) Receives Help From: Family;Other (Comment) (Is alone some during the day) Type of Home: House Home Layout: One level Home Access: Stairs to enter Entrance Stairs-Rails:  (has a wrought iron post she holds onto) Secretary/administrator of Steps: 1 Bathroom Shower/Tub: Forensic scientist: Standard Bathroom Accessibility: Yes How Accessible: Accessible via walker Home Adaptive Equipment: Bedside commode/3-in-1;Shower chair without back;Reacher;Sock aid;Long-handled sponge;Long-handled shoehorn (has all this equipment from mother that passed away) Additional Comments: Pt lost daughter in car accident 10 years ago, therefore grandson now lives with her. Prior Function Level of Independence: Independent with homemaking with ambulation Able to Take Stairs?: Yes Driving: Yes Vocation: Full time employment Leisure: Hobbies-yes (Comment) Comments: likes to work in  yard Sensation Light Touch: Appears Intact Stereognosis: Not tested Hot/Cold: Not tested Proprioception: Not tested Coordination Gross Motor Movements are Fluid and Coordinated: No Fine Motor Movements are Fluid and Coordinated: Yes Coordination and Movement Description: Pt. with poor coordination of right LE overall  ADLs/Mobility: ADL Eating/Feeding: Performed;Set up Where Assessed - Eating/Feeding: Chair Grooming: Performed;Wash/dry face;Teeth care;Brushing hair;Supervision/safety Where Assessed - Grooming: Sitting, chair Upper Body Bathing: Simulated;Supervision/safety Where Assessed - Upper Body Bathing: Sitting, chair Lower Body Bathing: Moderate assistance;Simulated Where Assessed - Lower Body Bathing: Sit to stand from chair Upper Body Dressing: Minimal assistance;Other (comment) (cues to wear corsett tighter) Where Assessed - Upper Body Dressing: Sitting, chair Lower  Body Dressing: Moderate assistance;Performed Lower Body Dressing Details (indicate cue type and reason): has difficulty reaching feet for adls. Has a reacher and sock aid at home and may benefit from using them secondary to back precautions. Where Assessed - Lower Body Dressing: Sit to stand from chair Toilet Transfer: Performed;Moderate assistance Toilet Transfer Details (indicate cue type and reason): most difficulty coming sit to stand Toilet Transfer Method: Stand pivot Toilet Transfer Equipment: Bedside commode Toileting - Clothing Manipulation: Not assessed Toileting - Hygiene: Minimal assistance Where Assessed - Toileting Hygiene: Sit on 3-in-1 or toilet;Other (comment) (cues to not twist when cleaning self) Tub/Shower Transfer: Not assessed Equipment Used: Rolling walker  Bed Mobility Bed Mobility: Yes Sit to Supine - Right: 2: Max assist Sit to Supine - Right Details (indicate cue type and reason): needed assist to get LEs up onto the bed  Cues given to maintain log rolling Sit to Supine - Left: 1: +2 Total assist Sit to Supine - Left Details (indicate cue type and reason): +2 total assist(pt=40%); assistance to lift bil LE's onto bed, lower shoulders trunk to sidelying, assistance to prevent twisting; cues for sequencing, technique, hand placement, reinforcement of precautions Transfers Transfers: Yes Sit to Stand: 3: Mod assist;With upper extremity assist;With armrests;From chair/3-in-1 Sit to Stand Details (indicate cue type and reason): Pt needed assist to get started.  Once up and walker in front of her, pt did very well. Stand to Sit: 3: Mod assist;To chair/3-in-1;To bed;With upper extremity assist Stand to Sit Details: Cues to reach back for bedrail on bed Stand Pivot Transfers: 1: +2 Total assist Stand Pivot Transfer Details (indicate cue type and reason): pt.= 60 % to transition pivoting chair to bed.  Pt. has difficulty coordinating right LE to pivot around to bed.  Needed  max cuing to move LEs effectively and tot assist to move RW for sequencing.Pt. also needed cues for upright posture as well as she tended to stay flexed.   Ambulation/Gait Ambulation/Gait: Yes Ambulation/Gait Assistance: 1: +2 Total assist Ambulation/Gait Assistance Details (indicate cue type and reason): +2 total assist(pt=70%); assistance for balance, safety, advancement of RW; cues for sequencing, RW management, upright posture Ambulation Distance (Feet): 10 Feet (5'+5') Assistive device: Rolling walker Gait Pattern: Decreased step length - right;Decreased step length - left;Trunk flexed;Decreased weight shift to left;Decreased weight shift to right;Decreased stride length Stairs: No Wheelchair Mobility Wheelchair Mobility: No Posture/Postural Control Posture/Postural Control: Postural limitations Postural Limitations: Flexed posture. Balance Balance Assessed: No  Prior Functional Levels:  Prior Functional Level Bed Mobility: I Transfers: I Mobility - Walk/Wheelchair: I Upper Body Dressing: I Lower Body Dressing: I Grooming: I Eating/Drinking: I Toilet Transfer: I Bladder Continence: WNL Bowel Management: WNL Stair Climbing: I Communication: WNL Memory: WNL Cooking/Meal Prep: I Housework: I Money Management: I Driving: Yes  Current Functional Levels:  Current  Functional Level Bladder Continence: Foley catheter (Reinserted 2 days ago) Bowel Management: Last BM 08/23/11 (Had an enama, has constipation) Communication: WNL Memory: WNL  Discharge Living Setting:  Discharge Living Setting Plans for Discharge Living Setting: Patient's home Discharge Living Setting Number of Levels: 1 Discharge Living Setting Number of Steps: 1 Discharge Living Setting is Bedroom on Main Floor?: Yes Discharge Living Setting is Bathroom on Main Floor?: Yes Social/Family/Support Systems:  Social/Family/Support Systems Patient Roles: Parent Contact Information:  Tenisha Fleece, DTR  (c)  [phone]) Anticipated Caregiver: DTR Anticipated Caregiver's Contact Information: see above Ability/Limitations of Caregiver: Dtr works, has 3 Dtrs (Dtrs work different shifts and have some flexibility) Caregiver Availability: Other (Comment) (Can get help as needed) Discharge Plan Discussed with Primary Caregiver: Yes Is Caregiver In Agreement with Plan?: Yes Does Caregiver/Family have Issues with Lodging/Transportation while Pt is in Rehab?: Yes Goals/Additional Needs:  Goals/Additional Needs Patient/Family Goal for Rehab: S/Mod I goals (ELOS 1 week plus/minus) Cultural Considerations: [employer] Dietary Needs: Regular Equipment Needs: TBD Pt/Family Agrees to Admission and willing to participate: Yes Program Orientation Provided & Reviewed with Pt/Caregiver Including Roles  & Responsibilities: Yes  Preadmission Screen Completed By:  Trish Mage, 08/24/2011 11:06 AM  Patient's condition:  Please see physician update to information in consult dated 08/21/11 12:37.  Preadmission Screen Competed QM:VHQIO Koleen Distance, RN, Time/Date,1104/08-24-11.  Discussed status with Dr. Riley Kill on11/12/12at 1150 and received telephone approval for admission today.  Admission Coordinator:  Trish Mage, time1106/Date11/12/12  .

## 2011-08-24 NOTE — Progress Notes (Signed)
  Stable. bstilkl slow with activity. To be transfer to the rehah unit

## 2011-08-24 NOTE — Progress Notes (Signed)
Admitted to unit. Orientated to Rehab expectations and procedure. Review safety plan, received appropriate handouts, lead nurse and butterfly moment.

## 2011-08-24 NOTE — Progress Notes (Signed)
Christena Sunderlin, PT  Pager [phone] 08/24/2011  

## 2011-08-25 DIAGNOSIS — IMO0002 Reserved for concepts with insufficient information to code with codable children: Secondary | ICD-10-CM

## 2011-08-25 DIAGNOSIS — Q762 Congenital spondylolisthesis: Secondary | ICD-10-CM

## 2011-08-25 DIAGNOSIS — E669 Obesity, unspecified: Secondary | ICD-10-CM

## 2011-08-25 DIAGNOSIS — M4316 Spondylolisthesis, lumbar region: Secondary | ICD-10-CM

## 2011-08-25 DIAGNOSIS — R339 Retention of urine, unspecified: Secondary | ICD-10-CM

## 2011-08-25 LAB — COMPREHENSIVE METABOLIC PANEL
ALT: 18 U/L (ref 0–35)
AST: 27 U/L (ref 0–37)
Albumin: 2.6 g/dL — ABNORMAL LOW (ref 3.5–5.2)
Alkaline Phosphatase: 86 U/L (ref 39–117)
BUN: 13 mg/dL (ref 6–23)
CO2: 31 meq/L (ref 19–32)
Calcium: 9.4 mg/dL (ref 8.4–10.5)
Chloride: 98 meq/L (ref 96–112)
Creatinine, Ser: 0.78 mg/dL (ref 0.50–1.10)
GFR calc Af Amer: >90 mL/min (ref >90)
GFR calc non Af Amer: 82 mL/min — ABNORMAL LOW (ref >90)
Glucose, Bld: 111 mg/dL — ABNORMAL HIGH (ref 70–99)
Potassium: 4.1 mEq/L (ref 3.5–5.1)
Sodium: 137 meq/L (ref 135–145)
Total Bilirubin: 0.5 mg/dL (ref 0.3–1.2)
Total Protein: 6.5 g/dL (ref 6.0–8.3)

## 2011-08-25 LAB — CBC
HCT: 32.6 % — ABNORMAL LOW (ref 36.0–46.0)
Hemoglobin: 10.3 g/dL — ABNORMAL LOW (ref 12.0–15.0)
MCH: 29.1 pg (ref 26.0–34.0)
MCHC: 31.6 g/dL (ref 30.0–36.0)
MCV: 92.1 fL (ref 78.0–100.0)
Platelets: 322 10*3/uL (ref 150–400)
RBC: 3.54 MIL/uL — ABNORMAL LOW (ref 3.87–5.11)
RDW: 13.7 % (ref 11.5–15.5)
WBC: 8.7 10*3/uL (ref 4.0–10.5)

## 2011-08-25 LAB — URINE CULTURE
Colony Count: NO GROWTH
Culture  Setup Time: 201211130157
Culture: NO GROWTH

## 2011-08-25 MED ORDER — DOCUSATE SODIUM 283 MG RE ENEM
1.0000 | ENEMA | Freq: Every day | RECTAL | Status: DC | PRN
  Filled 2011-08-25: qty 1

## 2011-08-25 MED ORDER — POLYETHYLENE GLYCOL 3350 17 G PO PACK
17.0000 g | PACK | Freq: Every day | ORAL | Status: DC
  Administered 2011-08-26 – 2011-08-27 (×2): 17 g via ORAL
  Filled 2011-08-25 (×9): qty 1

## 2011-08-25 NOTE — Progress Notes (Signed)
[facility]patient Rehabilitation Center Individual Statement of Services  Patient Name:  Jenny [name]  Date:  08/25/2011  Welcome to the Inpatient Rehabilitation Center.  Our goal is to provide you with an individualized program based on your diagnosis and situation, designed to meet your specific needs.  With this comprehensive rehabilitation program, you will be expected to participate in at least 3 hours of rehabilitation therapies Monday-Friday, with modified therapy programming on the weekends.  Your rehabilitation program will include the following services:  Physical Therapy (PT), Occupational Therapy (OT), 24 hour per day rehabilitation nursing, Therapeutic Recreaction (TR), Case Management (RN and Child psychotherapist), Rehabilitation Medicine, Nutrition Services and Pharmacy Services  Weekly team conferences will be held on  Tuesday   to discuss your progress.  Your RN Case Designer, television/film set will talk with you frequently to get your input and to update you on team discussions.  Team conferences with you and your family in attendance may also be held.  Depending on your progress and recovery, your program may change.  Your RN Case Estate agent will coordinate services and will keep you informed of any changes.  Your RN Sports coach and SW names and contact numbers are listed  below.  The following services may also be recommended but are not provided by the Inpatient Rehabilitation Center:   Driving Evaluations  Home Health Rehabiltiation Services  Outpatient Rehabilitatation [facility]  Vocational Rehabilitation   Arrangements will be made to provide these services after discharge if needed.  Arrangements include referral to agencies that provide these services.  Your insurance has been verified to be:  Medicare + AARP  [medication coverage via AARP] Your primary doctor is:  Dr. Mammie [facility]  Lenny Pastel  [phone]  Pertinent information will be  shared with your doctor and your insurance company.  Case Manager: Melanee Spry, [facility] [phone]  Social Worker:  Amada Jupiter, [place] [phone]  ELOS:  7-10 days  Goals:  Modified Independent - supervision  Information discussed with and copy given to patient by: Brock Ra, 08/25/2011, 11:12 AM

## 2011-08-25 NOTE — Progress Notes (Signed)
Recreational Therapy Assessment and Plan  Patient Details  Name: Jenny [name] MRN: [id number] Date of Birth: [birth year]  Rehab Potential: Good   ELOS:   10 days  Assessment Clinical Impression:    Patient is a 70 y.o. year old female s/p L4/5 - L5/S1 fusion by Dr. Jeral Fruit on 08/19/2011. Patient transferred to CIR on 08/24/2011 . 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 Prior to admission, patient was active within the community. Pt now presents with decreased activity tolerance, decreased functional mobility, decreased balance limiting pts safety/independence with leisure/community pursuits.  Plan  Pt will participate in TR tasks 1 time per week for >20 minutes  Discharge Criteria: Patient will be discharged from TR if patient refuses treatment 3 consecutive times without medical reason.  If treatment goals not met, if there is a change in medical status, if patient makes no progress towards goals or if patient is discharged from hospital.  The above assessment, treatment plan, treatment alternatives and goals were discussed and mutually agreed upon: by patient.  Marland KitchenSIMPSON,Dmitry Macomber 08/25/2011, 4:01 PM

## 2011-08-25 NOTE — Patient Care Conference (Signed)
Inpatient RehabilitationTeam Conference Note Date: 08/25/2011   Time: 4:59 PM    Patient Name: Jenny [name]      Medical Record Number: [id number]  Date of Birth: 1941/02/06 Sex: Female         Room/Bed: 4008/4008-01 Payor Info: Payor: MEDICARE  Plan: MEDICARE PART A AND B  Product Type: *No Product type*     Admitting Diagnosis: lumbar fusion  Admit Date/Time:  08/24/2011  3:05 PM Admission Comments: No comment available   Primary Diagnosis:  <principal problem not specified> Principal Problem: <principal problem not specified>  Patient Active Problem List  Diagnoses Date Noted  . 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  . Vitamin D deficiency 02/12/2011  . Peripheral neuropathy 02/12/2011  . Intertrigo 01/21/2011  . Asthma 01/14/2011  . 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 History of chicken pox 01/14/2011  . History of measles 01/14/2011  . HAND PAIN 04/09/2010  . OTHER URINARY INCONTINENCE 03/12/2010  . DIABETES MELLITUS, BORDERLINE 08/21/2008  . 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  . HYPERTENSION 02/22/2008  . BRONCHITIS, RECURRENT 02/22/2008  . DIVERTICULOSIS OF COLON 02/22/2008  . DEGENERATIVE JOINT DISEASE 02/22/2008  . HYPOTHYROIDISM 08/23/2007  . OBESITY 08/23/2007  . SLEEP APNEA, OBSTRUCTIVE 08/23/2007  . VENOUS INSUFFICIENCY 08/23/2007  . ALLERGIC RHINITIS 08/23/2007  . GERD 08/23/2007  . BACK PAIN, LUMBAR 08/23/2007    Expected Discharge Date: Expected Discharge Date: 09/02/11  Team Members Present: Physician: Dr. Faith Rogue Case Manager Present: Melanee Spry, RN Social Worker Present: Amada Jupiter, LCSW PT Present: Elvia Collum, Judith Blonder, PTA OT Present: Mackie Pai, OT;Patricia Mat Carne, OT Other (Discipline and Name): Tora Duck, PPS Coordinator     Current Status/Progress Goal Weekly Team Focus  Medical   see 11/12  see 11/12  see 11/12   Bowel/Bladder   Contient of bowel. Indwelling foley cather to be discontinued in the a.m.  Contient of  bowel and bladder  Initiate bladder training   Swallow/Nutrition/ Hydration             ADL's   Overall supervision to min A except total A for LB ADLs  Overall mod I level except supervision for shower transfers  ADL retraining, increasing activity tolerance, dynamic standing   Mobility   mod assist transfers, min assist gait with RW x 79 feet, min assist stair negotiation  modified independent with RW  back precations, log roll, decreased assist transfers   Communication             Safety/Cognition/ Behavioral Observations            Pain   No c/o of pain  < 3  Offer prn medication 1 hr prior to therapy session   Skin   Bruising to BUE, lumbar incision with steristrips and drsg c.d.i.  Monitor of s/s of infection  Encourage to turn q 2hrs.      *See Interdisciplinary Assessment and Plan and progress notes for long and short-term goals  Barriers to Discharge: see 11/12    Possible Resolutions to Barriers:  see 11/12    Discharge Planning/Teaching Needs:  Patient to return home with daughter and grandson.  Three daughters total who all work varying shifts so can provide whatever support needed.  TBA   Team Discussion:  Foley is out & pt has voided/emptied well. Still working on bowel regularity. Txs expect Mod I- supervision goals  Revisions to Treatment Plan:  none   Continued Need for Acute Rehabilitation Level of Care: The patient requires daily medical  management by a physician with specialized training in physical medicine and rehabilitation for the following conditions: Daily direction of a multidisciplinary physical rehabilitation program to ensure safe treatment while eliciting the highest outcome that is of practical value to the patient.: Yes Daily medical management of patient stability for increased activity during participation in an intensive rehabilitation regime.: Yes Daily analysis of laboratory values and/or radiology reports with any subsequent need for  medication adjustment of medical intervention for : Post surgical problems  Brock Ra 08/25/2011, 4:59 PM

## 2011-08-25 NOTE — Progress Notes (Signed)
Patient information reviewed and entered into [facility] system by Deolinda Frid, RN, CRRN, PPS Coordinator.  Information including medical coding and functional independence measure will be reviewed and updated through discharge.     Per nursing patient was given "Data Collection Information Summary for Patients in Inpatient Rehabilitation Facilities with attached "Privacy Act Statement-Health Care Records" upon admission.   

## 2011-08-25 NOTE — Progress Notes (Signed)
Social Work Assessment and Plan Assessment and Plan  Patient Name: Jenny [name]  NFAOZ'H Date: 08/25/2011  Problem List:  Patient Active Problem List  Diagnoses  . HYPOTHYROIDISM  . OBESITY  . DYSTHYMIA  . SLEEP APNEA, OBSTRUCTIVE  . HYPERTENSION  . VENOUS INSUFFICIENCY  . ALLERGIC RHINITIS  . BRONCHITIS, RECURRENT  . GERD  . DIVERTICULOSIS OF COLON  . DEGENERATIVE JOINT DISEASE  . BACK PAIN, LUMBAR  . HAND PAIN  . OTHER URINARY INCONTINENCE  . DIABETES MELLITUS, BORDERLINE  . Asthma  . Anaphylaxis due to food  . History of chicken pox  . History of measles  . Intertrigo  . Vitamin D deficiency  . Peripheral neuropathy  . Spondylolisthesis of lumbar region    Past Medical History:  Past Medical History  Diagnosis Date  . Allergy     rhinitis  . Bronchitis   . Venous insufficiency   . Obesity   . Hypothyroidism   . GERD (gastroesophageal reflux disease)   . Diverticulosis of colon   . Urinary incontinence   . Degenerative joint disease   . Lumbar back pain   . Vitamin D deficiency   . Dysthymia   . Asthma     mild, intermittent  . Transient memory loss March 2006    after a stressful event she had no memory the rest of that day  . VITAMIN D DEFICIENCY 08/21/2008  . VENOUS INSUFFICIENCY 08/23/2007  . Other urinary incontinence 03/12/2010  . OBESITY 08/23/2007  . HYPOTHYROIDISM 08/23/2007  . HAND PAIN 04/09/2010  . GERD 08/23/2007  . 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  . DIVERTICULOSIS OF COLON 02/22/2008  . DIABETES MELLITUS, BORDERLINE 08/21/2008  . DEGENERATIVE JOINT DISEASE 02/22/2008  . BRONCHITIS, RECURRENT 02/22/2008  . BACK PAIN, LUMBAR 08/23/2007  . ALLERGIC RHINITIS 08/23/2007  . Asthma 01/14/2011  . 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 History of chicken pox 01/14/2011  . History of measles 01/14/2011  . PONV (postoperative nausea and vomiting)   . Peripheral neuropathy 02/12/2011    possibly related to spondylolisthesis per pt  . Urinary frequency   .  Cough     usually not productive  . Incontinence of urine   . Obstructive sleep apnea     does not use CPAP, lost weight  . SLEEP APNEA, OBSTRUCTIVE 08/23/2007  . Hypertension   . HYPERTENSION 02/22/2008    Past Surgical History:  Past Surgical History  Procedure Date  . Appendectomy   . Tonsillectomy and adenoidectomy   . Ap repair and sling for cystocele 8-04     Dr Patsi Sears  . Left hand 2010    left thumb, joint removal, [facility]  . Ctr     bilateral  . Shoulder arthroscopy distal clavicle excision and open rotator cuff repair     right  . Neck surgery     x 2 - discectomy, fusion  . Abdominal hysterectomy     took one ovary  . Cholecystectomy   . Carpal tunel 2008    Discharge Planning  Discharge Planning Living Arrangements: Children Support Systems: Children;Friends/neighbors;Church/faith community Patient expects to be discharged to:: Home with daughter and grandson Expected Discharge Date:  (elos 7 days) Case Management Consult Needed: Yes (Comment) (already following)  Social/Family/Support Systems Social/Family/Support Systems Patient Roles: Parent Contact Information: Home [phone] Anticipated Caregiver: Dtr., Reather Littler plus additionsl prn assistance of daughters, Aurther Loft and Erie Noe who work f/t and live locally.  Lavonna Monarch, is 72 y.o. and at home with patient -  working p/t, prn Anticipated Industrial/product designer Information: Bjorn Loser - same home #.  Cell [phone]  Employment Status Employment Status Employment Status: Retired Date Retired/Disabled/Unemployed: 2006 Age Retired: 64  Fish farm manager Issues: None Guardian/Conservator: None  Abuse/Neglect    Emotional Status Emotional Status Pt's affect, behavior adn adjustment status: Very pleasant, talkative woman who is A&O x4.  Speaks affectionately of her family, friends and neighbors describing them all as very supportive.  Admits frustration with functional limitations, yet denies  any s/s of depression, anxiety or general emotional difficulties.  BDI screen deferred at this time, but will monitor. Recent Psychosocial Issues: Pt's mother died in 2010-11-10: one daughter killed in MVA approx. 10 years ago (her son is living with patient) Pyschiatric History: None  Patient/Family Perceptions, Expectations & Goals Pt/Family Perceptions, Expectations and Goals Pt/Family understanding of illness & functional limitations: patient and daughter with general understanding of DDD and surgery performed as well as back precautions to be followed. Premorbid pt/family roles/activities: Patient was independent overall, but mobility was being significantly limited by pain just PTA.  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 Anticipated changes in roles/activities/participation: daughters and grandson will likely need to provide some caregiver support  Pt/family expectations/goals: patient hopes to reach some level of modified independence  Careers adviser: None Premorbid Home Care/DME Agencies: None Transportation available at discharge: yes  Discharge Visual merchandiser Resources: Administrator (specify) Building services engineer) Financial Resources: Tree surgeon;Family Support Financial Screen Referred: No Living Expenses: Motgage Money Management: Patient Home Management: patient shared with children Patient/Family Preliminary Plans: home with family who can provide up to 24 hour if needed (temporarily)  Clinical Impression:  Very pleasant woman here after back surgery.  Reports excellent support from family and friends. Denies any significant emotional distress but will monitor.  Very motivated.      Amada Jupiter 08/25/2011

## 2011-08-25 NOTE — Progress Notes (Signed)
Conference update w/ pt:  ELOS 11/21 Goals: Mod I - supervision  Pt agreeable to this plan.

## 2011-08-25 NOTE — Progress Notes (Signed)
  Subjective/Complaints: Couldn't get comfortable and had problems sleeping.  Constipated.  Occasional cough. ROS otherwise negative.   Objective: Vital Signs: Blood pressure 114/73, pulse 92, temperature 97.5 F (36.4 C), temperature source Oral, resp. rate 18, weight 106.505 kg (234 lb 12.8 oz), SpO2 91.00%. No results found.  Basename 08/25/11 0640  WBC 8.7  HGB 10.3*  HCT 32.6*  PLT 322    Basename 08/25/11 0640  NA 137  K 4.1  CL 98  CO2 31  GLUCOSE 111*  BUN 13  CREATININE 0.78  CALCIUM 9.4   CBG (last 3)  No results found for this basename: GLUCAP:3 in the last 72 hours  Wt Readings from Last 3 Encounters:  08/24/11 106.505 kg (234 lb 12.8 oz)  08/20/11 107.639 kg (237 lb 4.8 oz)  08/20/11 107.639 kg (237 lb 4.8 oz)    Physical Exam:  General appearance: alert Head: Normocephalic, without obvious abnormality, atraumatic Eyes: conjunctivae/corneas clear. PERRL, EOM's intact. Fundi benign. Ears: normal TM's and external ear canals both ears Nose: Nares normal. Septum midline. Mucosa normal. No drainage or sinus tenderness. Throat: lips, mucosa, and tongue normal; teeth and gums normal Neck: no adenopathy, no carotid bruit, no JVD, supple, symmetrical, trachea midline and thyroid not enlarged, symmetric, no tenderness/mass/nodules Back: back tender, wearing lso Resp: clear to auscultation bilaterally Cardio: regular rate and rhythm, S1, S2 normal, no murmur, click, rub or gallop GI: soft, non-tender; bowel sounds normal; no masses,  no organomegaly Extremities: extremities normal, atraumatic, no cyanosis or edema Pulses: 2+ and symmetric Skin: Skin color, texture, turgor normal. No rashes or lesions Neurologic: Grossly normal  Moves all 4's.  No focal weakness but does have proximal pain inhibition weakness. Incision/Wound: back incision clean dry and intact   Assessment/Plan: 1. Functional deficits secondary to lumbar spondylolisthesis and stenosis s/p  lumbar lami and facectectomy which require 3+ hours per day of interdisciplinary therapy in a comprehensive inpatient rehab setting. Physiatrist is providing close team supervision and 24 hour management of active medical problems listed below. Physiatrist and rehab team continue to assess barriers to discharge/monitor patient progress toward functional and medical goals.  Evals underway  Mobility:     Ambulation/Gait Ambulation/Gait Assistance: Not tested (comment)   ADL:   Cognition: Cognition Orientation Level: Oriented X4 Cognition Orientation Level: Oriented X4  2. DVT Prophylaxis/Anticoagulation: Mechanical:  Antiembolism stockings, knee (TED hose) Bilateral lower extremities Sequential compression devices, below knee Bilateral lower extremities Pharmaceutical: Lovenox  2. Pain Management: Prn oxycodone. May need schedule percocet prior to therapies. D/C valium and use robaxin prn for spasms.  3. Mood: Motivated to get better. Monitor for now.  Patient Active Hospital Problem List:  4. Asthma/Bronchitis: Resume symbicort. Monitor respiratory status for now. Use albuterol MDI prn SOB/  5. Urinary retention: Foley replaced 11/10. Check UA/UCS. ? Neurogenic bladder. Needs to get onto [facility] to be able to empty. Toilet every 4 hours/ voiding trial today. 6. Hypothyroid: Continue supplemental synthyroid  7.GERD: On scheduled pepcid. Increased GI symtoms currently due to decreased moblility. Needs to be upright for meals.  8. Hypertension: Monitor on lasix daily. BID checks  9. Hyperkalemia: Off supplement currently. Recheck lytes in am.  10. Constipation: Sorbitol past admission. Repeat enema if no results. Reports no BM since surgery. Schedule miralax daily in am. Feels like she needs to have bm soon.      ELOS (Days) 1  , T 08/25/2011, 7:39 AM

## 2011-08-25 NOTE — Progress Notes (Signed)
Physical Therapy Note  Patient Details  Name: SAUSHA RAYMOND MRN: [id number] Date of Birth: 1941-08-17 Today's Date: 08/25/2011  3/10 pain in back that increased with muscle spasms.  Side to sit min assist and sit to side mod assist.  Toilet transfer min assist using grab bars.  Pt able to manage clothing.gati training 75 feet with rw.  Pt missed 15 minutes secondary nursing needs.   Julian Reil 08/25/2011, 4:03 PM

## 2011-08-25 NOTE — Progress Notes (Addendum)
Physical Therapy Assessment and Plan  Patient Details  Name: Jenny [name] MRN: [id number] Date of Birth: 1941-06-22  PT Diagnosis: difficulty walking, Muscle spasms and Muscle weakness, acute pain Rehab Potential: Good ELOS: 7-10 days  Time In/Out: 0903-1007 64 minutes  Assessment & Plan Clinical Impression: Patient is a 70 y.o. year old female s/p L4/5 - L5/S1 fusion by Dr. Jeral Fruit on 08/19/2011.  Patient transferred to CIR on 08/24/2011 .  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 Prior to admission, patient was I with ADL's, mobility and driving.  Patient currently requires mod A with mobility secondary to muscle weakness and muscle spasm.  Prior to hospitalization, patient was independent with mobility and lived with Daughter in a [address].  Home access is Stairs to enter (1).  Patient will benefit from skilled PT intervention to increase bilateral LE strength, increase bilateral LE ROM,  normalize gait, transfer training and stair training.  Patient f/u after D/C TBD.  PT - End of Session Activity Tolerance: Decreased this session Endurance Deficit: Yes Endurance Deficit Description: needs frequent breaks due to pain and muscle spasms PT Assessment Rehab Potential: Good Barriers to Discharge: None PT Plan PT Frequency: 2-3 X/day, 60-90 minutes Estimated Length of Stay: 10 PT Treatment/Interventions: Ambulation/gait training;Community reintegration;DME/adaptive equipment instruction;Functional mobility training;Neuromuscular re-education;Patient/family education;Stair training;Therapeutic Activities;Therapeutic Exercise;UE/LE Strength taining/ROM  Precautions/Restrictions Precautions Precautions: Back Precaution Comments: Avoid twisting, bending, arching Required Braces or Orthoses: Yes Spinal Brace: Lumbar corset Restrictions Weight Bearing Restrictions: No Pain Pain  Assessment Pain Assessment: 0-10 Pain Score:   4 Pain Type: Surgical pain;Other (Comment) (Bilateral LE spasms in lateral thigh/buttocks) Pain Location: Back Pain Orientation: Lower Pain Descriptors: Spasm;Discomfort Pain Onset: Gradual Patients Stated Pain Goal: 1 Pain Intervention(s): Other (Comment) (premedicated; rest breaks prn) Multiple Pain Sites: No Home Living/Prior Functioning Home Living Lives With: Daughter Receives Help From: Family;Other (friends) (live-in daughter; another daughter local to help) Type of Home: House Home Layout: One level Home Access: Stairs to enter (1) Entrance Stairs-Rails: Can reach both (can use rod iron for support) Entrance Stairs-Number of Steps: 1 Bathroom Shower/Tub: Forensic scientist: Standard Bathroom Accessibility: Yes How Accessible: Accessible via walker Home Adaptive Equipment: Walker - rolling;Sock aid, 3:1, reacher Additional Comments: equipment from patient's mother that passed away. Per pt, equipment is in good condition. 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) Cognition Overall Cognitive Status: Appears within functional limits for tasks assessed Arousal/Alertness: Awake/alert Orientation Level: Oriented X4 Sensation Sensation Light Touch: Appears Intact Stereognosis: Impaired by gross assessment Hot/Cold: Impaired by gross assessment Proprioception: Appears Intact Additional Comments: hx numbness/tingling bilateral hands past 4 yrs Coordination Gross Motor Movements are Fluid and Coordinated: Yes Fine Motor Movements are Fluid and Coordinated: Yes Motor  Motor Motor: Within Functional Limits  Mobility Bed Mobility Bed Mobility: No Transfers Transfers: Yes Sit to Stand: 3: Mod assist;With upper extremity assist Sit to  Stand Details: Tactile cues for posture;Verbal cues for technique;Verbal cues for precautions/safety;Verbal cues for safe use of DME/AE Sit to Stand Details (indicate cue type and reason): thoracic assistance to maintain back precautions, balance and safety Stand to Sit: 4: Min assist;With upper extremity assist Stand to Sit Details (indicate cue type and reason): Verbal cues for technique;Verbal cues for precautions/safety;Verbal cues for safe use of DME/AE Locomotion  Ambulation Ambulation: Yes Ambulation/Gait Assistance: 3: mod assist Ambulation Distance (Feet): 3 Feet Assistive device: LSO Ambulation/Gait Assistance Details: Verbal cues for technique;Verbal cues for precautions/safety Gait Gait: Yes Gait Pattern: Impaired Gait Pattern: Decreased step length - right;Decreased step length - left;Step-through pattern;Shuffle (wide base of support with bilateral toe out), poor graded weight shift Stairs  Stairs: Yes Stairs Assistance: 4: Min assist Stairs Assistance Details: Verbal cues for technique;Verbal cues for gait pattern;Verbal cues for precautions/safety Stair Management Technique: Two rails;Step to pattern Number of Stairs: 3  Height of Stairs: 4 inches   Balance Balance Static Sitting Balance: independent Dynamic Sitting Balance: supervision with 1 UE support, focus on back precautions Static Standing Balance: min assist with bilat UE support Dynamic Standing Balance: min assist with bilat UE support Extremity Assessment  Right LE- AROM WNL. Hip flexion 3/5, knee extension 3+/5, ankle dorsiflexion 4/5. Coordination WNL. Left LE- AROM WNL. Hip flexion 3+/5, knee extension 4/5, ankle dorsiflexion 5/5. Coordination WNL.    Treatment initiated during session: Pt education re: back precautions. Pt with verbal agreement, currently requires min verbal cues to maintain during mobility. Sit-stand transfer training, focus on bilat UE position, anterior weight shift, and back  precautions mod assist. Increased spasms with mobility causing increased pain. Standing pre-gait activities including lateral weight shifts, heel lifts, and stepping in place mod assist. Decreased graded lateral weight shifts, delayed reaction time to LOB. Gait training with RW x 79 feet min assist, focus on RW position to increase safety and decrease risk of fall.  Discharge Criteria: Patient will be discharged from PT if patient refuses treatment 3 consecutive times without medical reason, if treatment goals not met, if there is a change in medical status, if patient makes no progress towards goals or if patient is discharged from hospital.  The above assessment, treatment plan, treatment alternatives and goals were discussed and mutually agreed upon by patient.  Christianne Dolin 08/25/2011, 10:45 AM

## 2011-08-25 NOTE — Progress Notes (Signed)
Occupational Therapy Assessment and Plan & Session Notes  Patient Details  Name: Jenny [name] MRN: [id number] Date of Birth: December 19, 1940  OT Diagnosis: lumbago (low back pain) and muscle weakness (generalized) Rehab Potential: Rehab Potential: Good ELOS: 7-10 days   Assessment & Plan Clinical Impression: Jenny [name] is an 70 y.o. female With history of obesity, DDD with cervical decompression few months ago, and back pain with radiation to BLE due to L4-L5 and L5-S1 due to spondylolistesis and stenosis. Patient elected to undergo bilateral L3-L5 laminectomies with facetectomies and fusion by Dr. Jeral Fruit on 08/19/2011. Post op with back pain and difficulty with RLE coordiantion and needs maximal cues for posture. MD recommending CIR. Patient transferred to CIR on 08/24/2011 .  Patient's past medical history is significant for: Allergy  rhinitis   Bronchitis   Venous insufficiency   Obesity   Hypothyroidism   GERD (gastroesophageal reflux disease)   Diverticulosis of colon   Urinary incontinence   Degenerative joint disease   Lumbar back pain   Vitamin D deficiency   Dysthymia   Asthma  mild, intermittent   Transient memory loss  March 2006  after a stressful event she had no memory the rest of that day  VITAMIN D DEFICIENCY  08/21/2008   VENOUS INSUFFICIENCY  08/23/2007   Other urinary incontinence  03/12/2010   OBESITY  08/23/2007   HYPOTHYROIDISM  08/23/2007   HAND PAIN  04/09/2010   GERD  08/23/2007   DYSTHYMIA  03/06/2008   DIVERTICULOSIS OF COLON  02/22/2008   DIABETES MELLITUS, BORDERLINE  08/21/2008   DEGENERATIVE JOINT DISEASE  02/22/2008   BRONCHITIS, RECURRENT  02/22/2008   BACK PAIN, LUMBAR  08/23/2007   ALLERGIC RHINITIS  08/23/2007   Asthma  01/14/2011   Anaphylaxis due to food  01/14/2011   History of chicken pox  01/14/2011   History of measles  01/14/2011   PONV (postoperative nausea and vomiting)   Peripheral neuropathy  02/12/2011  possibly  related to spondylolisthesis per pt   Urinary frequency   Cough  usually not productive  Incontinence of urine   Obstructive sleep apnea  does not use CPAP, lost weight   SLEEP APNEA, OBSTRUCTIVE  08/23/2007   Hypertension   HYPERTENSION  02/22/2008   Patient currently requires supervision with upper body ADLs and Total assist with lower body ADLs secondary to muscle weakness.  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   Patient will benefit from skilled intervention to increase independence with basic self-care skills and increase level of independence with iADL prior to discharge home independently.  Anticipate patient will be at an overall modified independent level for aspects of daily living. To be determined if additional OT will be recommended at discharge.   OT - End of Session Activity Tolerance: Tolerates 30+ min activity with multiple rests OT Assessment Rehab Potential: Good Barriers to Discharge: None at this time-goals @ Mod I level OT Plan OT Frequency: 1-2 X/day, 60-90 minutes Estimated Length of Stay: 7-10 days OT Treatment/Interventions: Balance/vestibular training;Community reintegration;DME/adaptive equipment instruction;Functional mobility training;Pain management;Patient/family education;Self Care/advanced ADL retraining;Therapeutic Activities;Therapeutic Exercise;UE/LE Strength taining/ROM;UE/LE Coordination activities  Precautions/Restrictions  Precautions Precautions: Back Required Braces or Orthoses: Yes Spinal Brace: Lumbar corset Restrictions Weight Bearing Restrictions: No  General Chart Reviewed: Yes Family/Caregiver Present: No  Pain Pain Score: 4/10 Pain Type: Acute pain Pain Location: Back Pain Orientation: Lower Pain Descriptors: Spasm;Discomfort  Pain Onset: Gradual Patients Stated Pain Goal: 1 Pain  Intervention(s): RN made aware Multiple Pain Sites: No  Home Living/Prior Functioning Home Living Lives With: Daughter Receives Help From: Family;Friend(s) Type of Home: House Home Layout: One level Home Access: Stairs to enter [employer] of Steps: 1 step up to porch Bathroom Shower/Tub: Tub/shower unit;Curtain Bathroom Toilet: Standard Bathroom Accessibility: Yes How Accessible: Accessible via walker Home Adaptive Equipment: Bedside commode/3-in-1;Shower chair without back;Reacher;Sock aid;Long-handled sponge Additional Comments: equipment from patient's mother that passed away IADL History Homemaking Responsibilities: Yes Meal Prep Responsibility: Primary Laundry Responsibility: Primary Cleaning Responsibility: Primary Bill Paying/Finance Responsibility: Primary Shopping Responsibility: Primary Child Care Responsibility: Secondary Current License: Yes Mode of Transportation: Car Occupation: Part time employment 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 Leisure and Hobbies: Go out to the movies, out to eat, and family gatherings Prior Function Level of Independence: Independent with basic ADLs Able to Take Stairs?: Yes Driving: Yes Vocation: Part time employment Leisure: Hobbies-yes(see above)  ADL ADL Equipment Provided:  None during initial evaluation/first session Grooming: Contact guard Where Assessed-Grooming: Standing at sink Upper Body Bathing: Supervision/safety Where Assessed-Upper Body Bathing: Edge of bed Lower Body Bathing: Dependent Where Assessed-Lower Body Bathing: Edge of bed Upper Body Dressing: Supervision/safety Where Assessed-Upper Body Dressing: Edge of bed Lower Body Dressing: Dependent Where Assessed-Lower Body Dressing: Edge of bed Toileting: Mod A Toilet Transfer: Min A Statistician Method: Arts development officer using tub transfer bench  with Mod A (patient needed assistance to lift both legs into tub/shower) Tub/Shower Transfer Method:  Building services engineer: Not assessed  Vision/Perception  Vision - History Baseline Vision: Wears glasses all the time Patient Visual Report: No change from baseline Vision - Assessment Eye Alignment: Within Functional Limits Vision Assessment: Vision not tested Perception Perception: Within Functional Limits   Cognition Overall Cognitive Status: Appears within functional limits for tasks assessed Arousal/Alertness: Awake/alert Orientation Level: Oriented X4  Sensation Sensation Light Touch: Impaired by gross assessment Stereognosis: Impaired by gross assessment Hot/Cold: Impaired by gross assessment Proprioception: Impaired by gross assessment Additional Comments: hands with decreased sensation for about four years; patient reports tingling, numbing, and pain throughout bilateral hands. Proximal to around wrist seem intact and within functional/normal limits Coordination Gross Motor Movements are Fluid and Coordinated: Yes Fine Motor Movements are Fluid and Coordinated: Yes  Motor  Motor Motor: Within Functional Limits  Mobility  Bed Mobility Bed Mobility: Yes Supine to Sit: 5: Supervision Sit to Supine - Right: 2: Max assist Transfers Transfers: Yes Sit to Stand: 3: Mod assist Stand to Sit: 4: Min assist   Extremity/Trunk Assessment RUE Assessment RUE Assessment: Within Functional Limits LUE Assessment LUE Assessment: Within Functional Limits  Short term goals = Long term goals  Recommendations for other services: Other: None at this time  Discharge Criteria: Patient will be discharged from OT if patient refuses treatment 3 consecutive times without medical reason, if treatment goals not met, if there is a change in medical status, if patient makes no progress towards goals or if patient is discharged from hospital.  The above assessment,  treatment plan, treatment alternatives and goals were discussed and mutually agreed upon: by patient-no family present.  Session Notes  (1) Time In: 0800 Time Out:  0900 60 minutes Individual Therapy Initial 1:1 OT evaluation completed. Treatment focus on bed mobility, functional ambulation throughout room with hand over hand assistance, UB/LB bathing and dressing in sit to stand position EOB,  donning/doffing lumbar corset sitting EOB, implementing back precautions throughout session, dynamic standing balance/tolerance at sink to complete grooming tasks, and increasing overall activity tolerance.   (2) Time In: 1130 Time Out: 1200 30 Minutes Individual Therapy Skilled OT for focus on functional ambulation using rolling walker, sit to stands, stand to sits, and transfers. Completed simulated tub bench transfer in tub/shower with mod assist from therapist to lift both legs over tub threshold, toilet transfer with min assist to and from elevated toilet seat, and toileting with mod assist; patient requiring assistance for perineal hygiene. Daughter, Bjorn Loser, present for entire session with education/training on sit to stands/stand to sits, tub/shower-tub bench transfers, and functional ambulation using rolling walker. Patient and daughter need reinforcement.   Janyiah Silveri 08/25/2011, 9:15 AM

## 2011-08-26 NOTE — Progress Notes (Signed)
Occupational Therapy Session Note  Patient Details  Name: Jenny [name] MRN: [id number] Date of Birth: November 15, 1940  Today's Date: 08/26/2011 Time: 9147-8295 Time Calculation (min): 35 min  Precautions: Precautions Precautions: Back Precaution Comments: Avoid twisting, bending, arching Required Braces or Orthoses: Yes Spinal Brace: Lumbar corset Restrictions Weight Bearing Restrictions: No  Short Term Goals = Long Term Goals  Skilled Therapeutic Interventions/Progress Updates:    Skilled OT for focus on education/training on using a sock aid with return demonstration from patient. Also engaged in toilet transfer (ambulating with supervision using rolling walker) and toileting. Focused remainder of session on therapeutic exercise in therapy gym with emphasis on dynamic standing balance/endurance, upper body strengthening, and increasing overall activity tolerance/endurance.   Pain No complaints of pain   Therapy/Group: Individual Therapy  Kendel Bessey 08/26/2011, 3:29 PM

## 2011-08-26 NOTE — Progress Notes (Signed)
Physical Therapy Note  Patient Details  Name: ITALY WARRINER MRN: [id number] Date of Birth: 1941-03-27 Today's Date: 08/26/2011  9147-8295 Individual therapy, notified RN for muscle relaxer. Bed mobility retraining from flat bed for supine <-> sit without rails and HOB flat, minA and cueing to maintain precautions. Gait with RW with close S for general strengthening, cues for positioning of walker during turns. Nustep level 3 x 5 min for LE strengthening and general activity tolerance. HEP (otago) given for fall prevention and general strengthening and balance - pt return demo x 15 reps of each with cues for technique.Cueing still needed for technique for sit to stands, but pt improving overall.   Karolee Stamps [facility] 08/26/2011, 1:27 PM

## 2011-08-26 NOTE — Progress Notes (Signed)
Occupational Therapy Session Note  Patient Details  Name: ROSIO WEISS MRN: [id number] Date of Birth: 1941/08/20  Today's Date: 08/26/2011 Time: 10:15-11:00 Time Calculation (min): 45 min  Precautions: Precautions Precautions: Back Precaution Comments: Avoid twisting, bending, arching Required Braces or Orthoses: Yes Spinal Brace: Lumbar corset Restrictions Weight Bearing Restrictions: No  Short Term Goals = Long Term Goals   Skilled Therapeutic Interventions/Progress Updates:    Engaged in ADL retraining/UB/LB dressing in sit to stand position from bed side recliner. Introduced Software engineer and long handled shoe horn) to increase independence with LB dressing. Per her report, she has adaptive equipment at home from mother who passed away last 2023/10/16 but has never used it herself. Verbal education and demonstration completed with patient return demonstrating tasks. Also completed toilet transfer and toileting in room bathroom, ambulating using rolling walker.   Pain Pain Assessment Pain Assessment: No/denies pain Pain Score: 0-No pain  Therapy/Group: Individual Therapy  Leiya Keesey 08/26/2011, 12:21 PM

## 2011-08-26 NOTE — Progress Notes (Addendum)
Subjective/Complaints: Slept better.. Occasional cough. ROS otherwise negative.   Objective: Vital Signs: Blood pressure 100/74, pulse 88, temperature 98.5 F (36.9 C), temperature source Oral, resp. rate 18, weight 106.505 kg (234 lb 12.8 oz), SpO2 94.00%. No results found.  Basename 08/25/11 0640  WBC 8.7  HGB 10.3*  HCT 32.6*  PLT 322    Basename 08/25/11 0640  NA 137  K 4.1  CL 98  CO2 31  GLUCOSE 111*  BUN 13  CREATININE 0.78  CALCIUM 9.4   CBG (last 3)  No results found for this basename: GLUCAP:3 in the last 72 hours  Wt Readings from Last 3 Encounters:  08/24/11 106.505 kg (234 lb 12.8 oz)  08/20/11 107.639 kg (237 lb 4.8 oz)  08/20/11 107.639 kg (237 lb 4.8 oz)    Physical Exam:  General appearance: alert Head: Normocephalic, without obvious abnormality, atraumatic Eyes: conjunctivae/corneas clear. PERRL, EOM's intact. Fundi benign. Ears: normal TM's and external ear canals both ears Nose: Nares normal. Septum midline. Mucosa normal. No drainage or sinus tenderness. Throat: lips, mucosa, and tongue normal; teeth and gums normal Neck: no adenopathy, no carotid bruit, no JVD, supple, symmetrical, trachea midline and thyroid not enlarged, symmetric, no tenderness/mass/nodules Back: back tender, wearing lso Resp: clear to auscultation bilaterally Cardio: regular rate and rhythm, S1, S2 normal, no murmur, click, rub or gallop GI: soft, non-tender; bowel sounds normal; no masses,  no organomegaly Extremities: extremities normal, atraumatic, no cyanosis or edema Pulses: 2+ and symmetric Skin: Skin color, texture, turgor normal. No rashes or lesions Neurologic: Grossly normal  Moves all 4's.  No focal weakness but does have proximal pain inhibition weakness. Incision/Wound: back incision clean dry and intact   Assessment/Plan: 1. Functional deficits secondary to lumbar spondylolisthesis and stenosis s/p lumbar lami and facectectomy which require 3+ hours  per day of interdisciplinary therapy in a comprehensive inpatient rehab setting. Physiatrist is providing close team supervision and 24 hour management of active medical problems listed below. Physiatrist and rehab team continue to assess barriers to discharge/monitor patient progress toward functional and medical goals.  Complete IPOC today.  Mobility: Bed Mobility Bed Mobility: No Supine to Sit: 5: Supervision Sit to Supine - Right: 2: Max assist Transfers Transfers: Yes Sit to Stand: 3: Mod assist;With upper extremity assist Sit to Stand Details (indicate cue type and reason): thoracic assistance to maintain back precautions, balance and safety Stand to Sit: 4: Min assist;With upper extremity assist Ambulation/Gait Ambulation/Gait Assistance: 4: Min assist Ambulation Distance (Feet): 79 Feet Assistive device: Rolling walker Gait Pattern: Decreased step length - right;Decreased step length - left;Step-through pattern;Shuffle (wide base of support with bilateral toe out) Stairs: Yes Stairs Assistance: 4: Min assist Stair Management Technique: Two rails;Step to pattern Number of Stairs: 3  Height of Stairs: 4    ADL:   Cognition: Cognition Overall Cognitive Status: Appears within functional limits for tasks assessed Arousal/Alertness: Awake/alert Orientation Level: Oriented X4 Cognition Arousal/Alertness: Awake/alert Orientation Level: Oriented X4  2. DVT Prophylaxis/Anticoagulation: Mechanical:  Antiembolism stockings, knee (TED hose) Bilateral lower extremities Sequential compression devices, below knee Bilateral lower extremities Pharmaceutical: Lovenox  2. Pain Management: Prn oxycodone. robaxin prn for spasms. This is effective currently.   3. Mood: Motivated to get better. Monitor for now.  Patient Active Hospital Problem List:  4. Asthma/Bronchitis: Resume symbicort. Monitor respiratory status for now. Use albuterol MDI prn SOB/ no problems currently. 5. Urinary  retention: emptying bladder now with foley out. 6. Hypothyroid: Continue supplemental synthyroid  7.GERD:  On scheduled pepcid. Increased GI symtoms currently due to decreased moblility. Needs to be upright for meals.  8. Hypertension: Monitor on lasix daily. BID checks  9. Hyperkalemia: Off supplement currently. Potassium normal yesterday.  10. Constipation: bowel regimen      ELOS (Days) 2  Jenny [name] T 08/26/2011, 6:25 AM

## 2011-08-26 NOTE — Progress Notes (Signed)
Physical Therapy Treatment Note  Patient Details  Name: Jenny [name] MRN: [id number] Date of Birth: 1941-01-10 Today's Date: 08/26/2011  1100-1155 Individual therapy, no c/o pain - states she is "worn out" from earlier occupational therapy. Gait training to therapy gym x 150' x2 for general strengthening and activity tolerance with RW with minA/close S, cueing for posture and staying inside RW. Decreased BOS and decreased R stance noted. Functional strengthening sit to stands x 5 reps with emphasis on technique and maintaining precautions while trying to use LEs more than uppers. Improved from minA to S. Practiced up/down curb step and ramp with RW to simulate home entry, modA initially due to A needed to place RW but progressed to minA after second repetition. Pt required cueing for technique and to get close to the step with feet prior to stepping up or down. Pt with improved eccentric control noted with stand to sit when transferring back to recliner in pt room.   Karolee Stamps [facility] 08/26/2011, 11:30 AM

## 2011-08-27 MED ORDER — METHOCARBAMOL 500 MG PO TABS
1000.0000 mg | ORAL_TABLET | Freq: Every day | ORAL | Status: DC
  Administered 2011-08-27 – 2011-09-01 (×6): 1000 mg via ORAL
  Filled 2011-08-27 (×10): qty 2

## 2011-08-27 NOTE — Progress Notes (Signed)
Patient alert and oriented X4.  Able to call staffs appropriately for assistance.  Patient requires standby assist, ambulates with a walker. Patient participating in therapy sessions.   Corset braces worn when OOB.  Incision on lower back with steristrips and dry dressing changed, intact, no drainage noted.  Patient is continent of bowel and bladder, LBM 11/14, takes miralax for bowel daily @[facility] .  C/o pain on lower back once this am, percocet 1 tab given. Patient on a regular diet.  Takes pills whole with thin liquid. Staffs will continue to assist patient as needed.

## 2011-08-27 NOTE — Progress Notes (Addendum)
Occupational Therapy Session Note  Patient Details  Name: Jenny [name] MRN: [id number] Date of Birth: 1941-03-30  Today's Date: 08/27/2011 Time: 8469-6295 Time Calculation (min): 55 min  Precautions: Precautions Precautions: Back Precaution Comments: Avoid twisting, bending, arching Required Braces or Orthoses: Yes Spinal Brace: Lumbar corset Restrictions Weight Bearing Restrictions: No  Short Term Goals = Long Term Goals  Addendum to enter pain; Upon entering room patient with complaints of pain-no rate given. RN aware.  Skilled Therapeutic Interventions/Progress Updates:    Pt seen for ADL retraining to include grooming, bathing, dressing, and walk-in shower transfer using tub transfer bench.  Focused session on Balance, Endurance, Pain, Safety, use of adaptive equipment (reacher, long handled sponge, and sock aid) to increase independence with LB ADLs, functional ambulation using rolling walker, and decreased standing balance.   Therapy/Group: Individual Therapy  Dabney Dever 08/27/2011, 11:55 AM

## 2011-08-27 NOTE — Progress Notes (Signed)
Patient Details  Name: Jenny [name] MRN: [id number] Date of Birth: 01/17/1941  Today's Date: 08/27/2011 Time:  14:10-14:40  Skilled Therapeutic Interventions/Progress Updates: Discussed energy conservation techniques and plans for leisure and community pursuits post d/c.  Pt able to identify safe techniques and problem solving for d/c.  No c/o pain.  Ambulation with RW with supervision and bed mobility with supervision.  Therapy/Group: Individual Therapy  Activity Level: Simple:  Level of assist: Supervision  Candita Borenstein 08/27/2011, 4:36 PM

## 2011-08-27 NOTE — Progress Notes (Signed)
Physical Therapy Note  Patient Details  Name: AMELIANNA MELLER MRN: [id number] Date of Birth: 01-26-1941 Today's Date: 08/27/2011  1445-1515, individual therapy, no c/o pain. Treatment focused on gait with RW at supervision level for general strengthening and activity tolerance. Pt able to self correct for posture. Standing therex for lower extremity strengthening with 2# weight on L and 1# weight on R, mini squats, sit to stands, standing hip abduction, hamstring curls, and heel/toe raises.    Karolee Stamps [facility] 08/27/2011, 3:18 PM

## 2011-08-27 NOTE — Progress Notes (Signed)
Subjective/Complaints: Spasms at night continue to be problematic and affects sleep. Review of Systems  All other systems reviewed and are negative.      Objective: Vital Signs: Blood pressure 109/74, pulse 62, temperature 97.5 F (36.4 C), temperature source Oral, resp. rate 18, weight 106.505 kg (234 lb 12.8 oz), SpO2 100.00%. No results found.  Basename 08/25/11 0640  WBC 8.7  HGB 10.3*  HCT 32.6*  PLT 322    Basename 08/25/11 0640  NA 137  K 4.1  CL 98  CO2 31  GLUCOSE 111*  BUN 13  CREATININE 0.78  CALCIUM 9.4   CBG (last 3)  No results found for this basename: GLUCAP:3 in the last 72 hours  Wt Readings from Last 3 Encounters:  08/24/11 106.505 kg (234 lb 12.8 oz)  08/20/11 107.639 kg (237 lb 4.8 oz)  08/20/11 107.639 kg (237 lb 4.8 oz)    Physical Exam:  General appearance: alert Head: Normocephalic, without obvious abnormality, atraumatic Eyes: conjunctivae/corneas clear. PERRL, EOM's intact. Fundi benign. Ears: normal TM's and external ear canals both ears Nose: Nares normal. Septum midline. Mucosa normal. No drainage or sinus tenderness. Throat: lips, mucosa, and tongue normal; teeth and gums normal Neck: no adenopathy, no carotid bruit, no JVD, supple, symmetrical, trachea midline and thyroid not enlarged, symmetric, no tenderness/mass/nodules Back: back tender, wearing lso Resp: clear to auscultation bilaterally Cardio: regular rate and rhythm, S1, S2 normal, no murmur, click, rub or gallop GI: soft, non-tender; bowel sounds normal; no masses,  no organomegaly Extremities: extremities normal, atraumatic, no cyanosis or edema Pulses: 2+ and symmetric Skin: Skin color, texture, turgor normal. No rashes or lesions Neurologic: Grossly normal  Moves all 4's.  No focal weakness but does have proximal pain inhibition weakness. No resting tone Incision/Wound: back incision clean dry and intact   Assessment/Plan: 1. 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 Physiatrist is providing close team supervision and 24 hour management of active medical problems listed below. Physiatrist and rehab team continue to assess barriers to discharge/monitor patient progress toward functional and medical goals.  Complete IPOC today.  Mobility: Bed Mobility Bed Mobility: No Supine to Sit: 5: Supervision Sit to Supine - Right: 2: Max assist Transfers Transfers: Yes Sit to Stand: 3: Mod assist;With upper extremity assist Sit to Stand Details (indicate cue type and reason): thoracic assistance to maintain back precautions, balance and safety Stand to Sit: 4: Min assist;With upper extremity assist Ambulation/Gait Ambulation/Gait Assistance: 5: Supervision Ambulation Distance (Feet): 79 Feet Assistive device: Rolling walker Gait Pattern: Decreased step length - right;Decreased step length - left;Step-through pattern;Shuffle (wide base of support with bilateral toe out) Stairs: Yes Stairs Assistance: 4: Min assist Stair Management Technique: Two rails;Step to pattern Number of Stairs: 3  Height of Stairs: 4    ADL:   Cognition: Cognition Overall Cognitive Status: Appears within functional limits for tasks assessed Arousal/Alertness: Awake/alert Orientation Level: Oriented X4 Cognition Arousal/Alertness: Awake/alert Orientation Level: Oriented X4  2. DVT Prophylaxis/Anticoagulation: Mechanical:  Antiembolism stockings, knee (TED hose) Bilateral lower extremities Sequential compression devices, below knee Bilateral lower extremities Pharmaceutical: Lovenox  2. Pain Management: Prn oxycodone. robaxin prn for spasms, and i will add 1000mg  qhs dose as well.    3. Mood: Motivated to get better. Monitor for now.  Patient Active Hospital Problem List:  4. Asthma/Bronchitis: Resume  symbicort. Monitor respiratory status  for now. Use albuterol MDI prn SOB/ no problems currently. 5. Urinary retention: emptying bladder now with foley out.  No problems currently. 6. Hypothyroid: Continue supplemental synthyroid  7.GERD: On scheduled pepcid. Increased GI symtoms currently due to decreased moblility. Needs to be upright for meals.  8. Hypertension: Monitor on lasix daily. BID checks  9. Hyperkalemia: Off supplement currently. Potassium normal yesterday.  10. Constipation: bowel regimen       Ammara Raj T 08/27/2011, 7:36 AM

## 2011-08-27 NOTE — Progress Notes (Signed)
Physical Therapy Note  Patient Details  Name: Jenny [name] MRN: [id number] Date of Birth: [birth year] Today's Date: 08/27/2011  1300-1400, individual therapy. No c/o pain, just fatigued. Treatment focused on gait training in controlled environment and home environment with RW >200'. Practiced making bed in ADL apartment and bed mobility following back precautions, able to complete at overall S level, good safety noted and followed all precautions with min cueing. Home environment gait with RW stepping over objects and walking on mat/compliant surface for uneven surface simulation with S. Stair training with bilateral rails for lower extremity strengthening and coordination with steady A progressing to S up/down 12 steps. Practiced going up/down 2 steps with RW backwards for entry to her daughter's house with minA, second person to hold RW for safety on first attempt. Bathroom mobility with S with RW in the room and dynamic balance at the sink to wash hands. Reviewed seated HEP to do in pt's room in between next therapy session.   Karolee Stamps [facility] 08/27/2011, 1:03 PM

## 2011-08-27 NOTE — Progress Notes (Signed)
Physical Therapy Session Note  Patient Details  Name: Jenny [name] MRN: [id number] Date of Birth: 1941-09-29  Today's Date: 08/27/2011 Time: 0800 - 0900  60 minutes  Precautions: Precautions Precautions: Back Precaution Comments: Avoid twisting, bending, arching Required Braces or Orthoses: Yes Spinal Brace: Lumbar corset Restrictions Weight Bearing Restrictions: No  Short Term Goals: PT Short Term Goal 1: pt will maintain 3/3 back precautions without cues during mobility activity PT Short Term Goal 2: pt will ambulate >196ft with RW and step-through gait pattern to allow for mod I home ambulation PT Short Term Goal 3: pt will demonstrate mod I stairs x 12 with bilat UE assist for improved bilat LE ROM/strength PT Short Term Goal 4: pt will consistently transfer with modified independence.  Skilled Therapeutic Interventions/Progress Updates:     Pain Pain Assessment Pain Assessment: 0-10 Pain Score:   3 Pain Type: Surgical pain Pain Location: Back Pain Orientation: Lower Pain Descriptors: Aching;Sore;Discomfort Pain Frequency: Intermittent Patients Stated Pain Goal: 1 Pain Intervention(s): Other (Comment) (premedicated, rest breaks prn) Multiple Pain Sites: No Mobility Transfers Sit to Stand: 4: Min assist Sit to Stand Details: Verbal cues for technique;Verbal cues for precautions/safety Sit to Stand Details (indicate cue type and reason): focus on anterior weight shift and UE position to maintain back precautions and increase functional independence. Stand to Sit: 4: Min assist Stand to Sit Details (indicate cue type and reason): Verbal cues for technique;Verbal cues for precautions/safety Stand to Sit Details: focus on increased hip flexion and UE position to maintain back precautions and increase functional independence. Decreased bilat LE eccentric control noted left > right. Locomotion  Ambulation Ambulation/Gait Assistance: 5: Supervision Ambulation Distance  (Feet): 167 Feet (x 2 trials to and from therapy gym) Assistive device: Rolling walker;Other (Comment) (LSO) Ambulation/Gait Assistance Details: Verbal cues for gait pattern Ambulation/Gait Assistance Details (indicate cue type and reason): focus on decreased left LE hip external rotation and heel strike for symmetry.   Other Treatments Treatments Therapeutic Exercise: sit to squat position with bilat UEs on knees with 3 second holds for increased functional strength and muscular endurance to facilitate transfers. With steady, hips at 45 degrees of hip flexion to standing position and standing squats to 45 degrees of hip flexion to full standing for increased functional strength and transfer facilitation. Minimal verbal cues throughout to maintain back precautions. Kinetron on 30 cycles/sec 3 x 2 min cycles for increased functional strength and muscular endurance.  Therapy/Group: Individual Therapy  Romeo Rabon 08/27/2011, 9:12 AM

## 2011-08-27 NOTE — Progress Notes (Signed)
Per State Regulation 482.30 This chart was reviewed for medical necessity with respect to the patient's Admission/Duration of stay. Pt participating & progressing in txs. She wears her lumbar corset.  She c/o of nighttime spasms &  Robaxin added scheduled hs.  Brock Ra                 Nurse Care Manager              Next Review Date: 08/31/11

## 2011-08-28 MED ORDER — OXYCODONE HCL 20 MG PO TB12
20.0000 mg | ORAL_TABLET | Freq: Every day | ORAL | Status: DC
  Administered 2011-08-28 – 2011-09-01 (×5): 20 mg via ORAL
  Filled 2011-08-28 (×6): qty 1

## 2011-08-28 NOTE — Progress Notes (Signed)
Subjective/Complaints: Still having pain from dinner time through evening.  Review of Systems  All other systems reviewed and are negative.      Objective: Vital Signs: Blood pressure 118/76, pulse 85, temperature 97.8 F (36.6 C), temperature source Oral, resp. rate 18, weight 106.505 kg (234 lb 12.8 oz), SpO2 92.00%. No results found. No results found for this basename: WBC:2,HGB:2,HCT:2,PLT:2 in the last 72 hours No results found for this basename: NA:2,K:2,CL:2,CO2:2,GLUCOSE:2,BUN:2,CREATININE:2,CALCIUM:2 in the last 72 hours CBG (last 3)  No results found for this basename: GLUCAP:3 in the last 72 hours  Wt Readings from Last 3 Encounters:  08/24/11 106.505 kg (234 lb 12.8 oz)  08/20/11 107.639 kg (237 lb 4.8 oz)  08/20/11 107.639 kg (237 lb 4.8 oz)    Physical Exam:  General appearance: alert Head: Normocephalic, without obvious abnormality, atraumatic Eyes: conjunctivae/corneas clear. PERRL, EOM's intact. Fundi benign. Ears: normal TM's and external ear canals both ears Nose: Nares normal. Septum midline. Mucosa normal. No drainage or sinus tenderness. Throat: lips, mucosa, and tongue normal; teeth and gums normal Neck: no adenopathy, no carotid bruit, no JVD, supple, symmetrical, trachea midline and thyroid not enlarged, symmetric, no tenderness/mass/nodules Back: back tender, wearing lso Resp: clear to auscultation bilaterally Cardio: regular rate and rhythm, S1, S2 normal, no murmur, click, rub or gallop GI: soft, non-tender; bowel sounds normal; no masses,  no organomegaly Extremities: extremities normal, atraumatic, no cyanosis or edema Pulses: 2+ and symmetric Skin: Skin color, texture, turgor normal. No rashes or lesions Neurologic: Grossly normal  Moves all 4's.  No focal weakness but does have proximal pain inhibition weakness. No resting tone Incision/Wound: back incision clean dry and intact   Assessment/Plan: 1. 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 Physiatrist is providing close team supervision and 24 hour management of active medical problems listed below. Physiatrist and rehab team continue to assess barriers to discharge/monitor patient progress toward functional and medical goals.  Complete IPOC today.  Mobility: Bed Mobility Bed Mobility: No Supine to Sit: 5: Supervision Sit to Supine - Right: 2: Max assist Transfers Transfers: Yes Sit to Stand: 4: Min assist Sit to Stand Details (indicate cue type and reason): focus on anterior weight shift and UE position to maintain back precautions and increase functional independence. Stand to Sit: 4: Min assist Stand to Sit Details: focus on increased hip flexion and UE position to maintain back precautions and increase functional independence. Decreased bilat LE eccentric control noted left > right. Ambulation/Gait Ambulation/Gait Assistance: 5: Supervision Ambulation/Gait Assistance Details (indicate cue type and reason): focus on decreased left LE hip external rotation and heel strike for symmetry. Ambulation Distance (Feet): 167 Feet (x 2 trials to and from therapy gym) Assistive device: Rolling walker;Other (Comment) (LSO) Gait Pattern: Decreased step length - right;Decreased step length - left;Step-through pattern;Shuffle (wide base of support with bilateral toe out) Stairs: Yes Stairs Assistance: 4: Min assist Stair Management Technique: Two rails;Step to pattern Number of Stairs: 3  Height of Stairs: 4    ADL:   Cognition: Cognition Overall Cognitive Status: Appears within functional limits for tasks assessed Arousal/Alertness: Awake/alert Orientation Level: Oriented X4 Cognition Arousal/Alertness: Awake/alert Orientation Level: Oriented X4  2. DVT Prophylaxis/Anticoagulation: Mechanical:   Antiembolism stockings, knee (TED hose) Bilateral lower extremities Sequential compression devices, below knee Bilateral lower extremities Pharmaceutical: Lovenox  2. Pain Management: Prn oxycodone. robaxin prn  for spasms. Scheduled robaxin didn't help.  Will add PM oxycontin 20mg  only at this point and observe her response.   3. Mood: Motivated to get better. Monitor for now.  Patient Active Hospital Problem List:  4. Asthma/Bronchitis: Resume symbicort. Monitor respiratory status for now. Use albuterol MDI prn SOB/ no problems currently. 5. Urinary retention: emptying bladder now with foley out.  No problems currently. 6. Hypothyroid: Continue supplemental synthyroid  7.GERD: On scheduled pepcid. Increased GI symtoms currently due to decreased moblility. Needs to be upright for meals.  8. Hypertension: Monitor on lasix daily. BID checks . Normotensive currently. 9. Hyperkalemia: Off supplement currently. Potassium normal 10. Constipation: bowel regimen       Jenny [name] T 08/28/2011, 7:28 AM

## 2011-08-28 NOTE — Progress Notes (Signed)
Physical Therapy Session Note  Patient Details  Name: Jenny [name] MRN: [id number] Date of Birth: 07-May-1941  Today's Date: 08/28/2011 Time: 1040-1140 Time Calculation (min): 60 min  Precautions: Precautions Precautions: Back Precaution Comments: Avoid twisting, bending, arching Required Braces or Orthoses: Yes Spinal Brace: Lumbar corset Restrictions Weight Bearing Restrictions: No  Short Term Goals: PT Short Term Goal 1: pt will maintain 3/3 back precautions without cues during mobility activity PT Short Term Goal 2: pt will ambulate >176ft with RW and step-through gait pattern to allow for mod I home ambulation PT Short Term Goal 3: pt will demonstrate mod I stairs x 12 with bilat UE assist for improved bilat LE ROM/strength PT Short Term Goal 4: pt will consistently transfer with modified independence.  Skilled Therapeutic Interventions/Progress Updates:      General   Vital Signs Oxygen Therapy SpO2: 94 % O2 Device: None (Room air) Pain Pain Assessment Pain Assessment: 0-10 Pain Score:   4 Pain Type: Surgical pain Pain Location: Back Pain Orientation: Posterior Pain Descriptors: Discomfort Pain Onset: On-going Patients Stated Pain Goal: 0 Pain Intervention(s): RN made aware Mobility   Locomotion  Ambulation Ambulation: Yes Ambulation/Gait Assistance: 5: Supervision Ambulation Distance (Feet): 150 Feet Assistive device: Rolling walker Ambulation/Gait Assistance Details (indicate cue type and reason): 2x 150 Stairs / Additional Locomotion Stairs: Yes Stairs Assistance: 4: Min assist Stairs Assistance Details (indicate cue type and reason): trialed different techniques for 2 steps without handrails.  pt required VC for rw use and preferred to put her hands on therapist shoulder and back up stairs. Stair Management Technique: No rails;Backwards Number of Stairs: 2   Trunk/Postural Assessment     Balance     Exercises    Other Treatments     Therapy/Group: Individual Therapy  Julian Reil 08/28/2011, 12:32 PM

## 2011-08-28 NOTE — Progress Notes (Signed)
Occupational Therapy Session Note  Patient Details  Name: Jenny [name] MRN: [id number] Date of Birth: 05-20-1941  Today's Date: 08/28/2011 Time: 0815-0915 Time Calculation (min): 60 min  Precautions: Precautions Precautions: Back Precaution Comments: Avoid twisting, bending, arching Required Braces or Orthoses: Yes Spinal Brace: Lumbar corset Restrictions Weight Bearing Restrictions: No   Skilled Therapeutic Interventions/Progress Updates:    ADL retraining including bathing at shower level in walk-in shower in room and dressing in bath room.  Pt ambulated with r/w to gather clothing and transfer to walk-in shower.  Pt used long handle sponge to bathe bilateral feet, reacher to don pants, and sock aid to don socks.  Pt ambulated in room with r/w to gather soiled clothing and to sink to brush teeth while standing.  Focus on safety awareness, activity tolerance, and standing balance.  Pain Pain Assessment Pain Assessment: 0-10 Pain Score:   3 Pain Type: Acute pain Pain Location: Foot Pain Orientation: Right;Left Pain Descriptors: Discomfort Pain Onset: On-going Patients Stated Pain Goal: 0 Pain Intervention(s): RN made aware  Therapy/Group: Individual Therapy  Rich Brave 08/28/2011, 9:25 AM

## 2011-08-28 NOTE — Progress Notes (Signed)
Physical Therapy Note  Patient Details  Name: Jenny [name] MRN: [id number] Date of Birth: April 25, 1941 Today's Date: 08/28/2011  1040-1140, individual therapy, 3/10 back pain -premedicated. Gait with RW with S >200', good posture noted and cadence. Simulated car transfers initially S due to cueing for safety with technique, progressed to modified independent. Pt able to verbalize when she needed to be careful of precautions. Toileting with S with RW and to perform hand hygiene at the sink. Nustep on level 5 x 10 min for general activity tolerance and strengthening. Home environment gait with RW and furniture transfers overall S, practiced sidestepping through furniture. BLE standing therex for strengthening and fall prevention sets of 15 reps for standing hip abduction, hamstring curls, mini squats, and heel/toe raises.  Karolee Stamps [facility] 08/28/2011, 10:43 AM

## 2011-08-28 NOTE — Progress Notes (Signed)
Occupational Therapy Session Note  Patient Details  Name: MALON SIDDALL MRN: [id number] Date of Birth: [birth year]  Today's Date: 08/28/2011 Time: 4098-1191 Time Calculation (min): 45 min  Precautions: Precautions Precautions: Back Precaution Comments: Avoid twisting, bending, arching Required Braces or Orthoses: Yes Spinal Brace: Lumbar corset Restrictions Weight Bearing Restrictions: No    Skilled Therapeutic Interventions/Progress Updates:   OT addressed functional mobility, sit to stand, walker safety in kitchen activity, adhering to back precautions while engaging in functional tasks.  Pt verbalized back precautions, but needed moderate verbal cues to follow.  Utilized Merchant [employer] officer Lexicographer) during tasks.  Pt. Demonstrated supervision at walker level during 1 item meal prep.        Pain:  none            Therapy/Group: Individual Therapy  Humberto Seals 08/28/2011, 4:31 PM

## 2011-08-28 NOTE — Discharge Summary (Signed)
Physician Discharge Summary  Patient ID: Jenny [name] MRN: [id number] DOB/AGE: 1941/04/30 70 y.o.  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 *    Discharged Condition: good  Hospital Course: l4 to s1 fusion  Consults: nonenone  Significant Diagnostic Studies: radiology: MRI: lumbar  Treatments: surgery:   Discharge Exam: Blood pressure 141/80, pulse 63, temperature 98.1 F (36.7 C), temperature source Oral, resp. rate 19, height 5\' 5"  (1.651 m), weight 107.639 kg (237 lb 4.8 oz), SpO2 94.00%.   Disposition: Rehab Facility  Discharge Orders    Future Appointments: Provider: Department: Dept Phone: Center:   03/11/2012 9:00 AM Michele Mcalpine, MD [facility] [phone] None     Discharge Medication List as of 08/24/2011  2:03 PM    CONTINUE these medications which have NOT CHANGED   Details  albuterol (PROVENTIL HFA;VENTOLIN HFA) 108 (90 BASE) MCG/ACT inhaler Inhale 2 puffs into the lungs every 6 (six) hours as needed. For wheezing , Starting 03/11/2011, Until Discontinued, Historical Med    budesonide-formoterol (SYMBICORT) 160-4.5 MCG/ACT inhaler Inhale 1 puff into the lungs 2 (two) times daily.  , Starting 03/11/2011, Until Discontinued, Historical Med    calcium carbonate (OS-CAL) 600 MG TABS Take 600 mg by mouth 2 (two) times daily with a meal. , Until Discontinued, Historical Med    clotrimazole-betamethasone (LOTRISONE) cream Apply 1 application topically 2 (two) times daily as needed. Rash, itching , Starting 01/21/2011, Until Thu 01/21/12, Historical Med    cyclobenzaprine (FLEXERIL) 10 MG tablet Take 10 mg by mouth 3 (three) times daily as needed. For muscle spasms  , Until Discontinued, Historical Med    diazepam (VALIUM) 5 MG tablet Take 5 mg by mouth every 6 (six) hours as needed. For  muscle spasms , Until Discontinued, Historical Med    famotidine (PEPCID) 10 MG tablet Take 10 mg by mouth 2 (two) times daily as needed. For indigestion, Until Discontinued, Historical Med    guaiFENesin (MUCINEX) 600 MG 12 hr tablet Take 1,200 mg by mouth 2 (two) times daily.  , Until Discontinued, Historical Med    HYDROcodone-acetaminophen (NORCO) 10-325 MG per tablet Take 1 tablet by mouth every 6 (six) hours as needed. For pain , Until Discontinued, Historical Med    levothyroxine (SYNTHROID, LEVOTHROID) 125 MCG tablet Take 125 mcg by mouth daily.  , Until Discontinued, Historical Med    nystatin (MYCOSTATIN) ointment Apply 1 application topically 2 (two) times daily as needed. For rash, Starting 01/14/2011, Until Discontinued, Historical Med    potassium chloride SA (K-DUR,KLOR-CON) 20 MEQ tablet Take 20 mEq by mouth daily as needed. With lasix; for swelling , Starting 02/12/2011, Until Discontinued, Historical Med    ALPRAZolam (XANAX) 0.5 MG tablet Take 0.5 mg by mouth 3 (three) times daily as needed. For anxiety, Until Discontinued, Historical Med    aspirin EC 81 MG tablet Take 81 mg by mouth daily.  , Until Discontinued, Historical Med    benzonatate (TESSALON PERLES) 100 MG capsule Take 1 capsule (100 mg total) by mouth 3 (three) times daily as needed for cough., Starting 02/12/2011, Until Fri 02/12/12, Normal    Cholecalciferol (VITAMIN D) 1000 UNITS capsule Take 1,000 Units by mouth daily. , Until Discontinued, Historical Med    furosemide (LASIX) 40 MG tablet Take 40 mg by mouth daily as needed. For swelling  , Starting 02/12/2011, Until Discontinued, Historical  Med         Signed: Karn Cassis 08/28/2011, 2:15 PM

## 2011-08-28 NOTE — Progress Notes (Signed)
Ambulating walker supervision. Incision C/D/I. Corsett on when OOB. Pain controlled with current medication regimen. Good appetite. Participating with therapy. Continue plan of care.

## 2011-08-29 NOTE — Progress Notes (Signed)
Occupational Therapy Session Note  Patient Details  Name: Jenny [name] MRN: [id number] Date of Birth: 11/07/1940  Today's Date: 08/29/2011 Time: 1000-1100 Time Calculation (min): 60 min  Precautions: Precautions Precautions: Back Precaution Comments: Avoid twisting, bending, arching Required Braces or Orthoses: Yes Spinal Brace: Lumbar corset Restrictions Weight Bearing Restrictions: No    Skilled Therapeutic Interventions/Progress Updates: am session:  s/c in room shower with focus on adhering to BAT back precautions and using AE to b/d feet and lower legs;   Time: 1430-1510 Time Calculation: 40 minutes Skilled Therapeutic Interventions/Progress Updates: pm session functional mobility in room for clthing retrieval and dynamic standing tasks to incorporate utilizing back precautions. required 2 reminders to turn whole body rather than twisting at waist   Pain 10+/1= both feet in pm session; meds given earlier   Therapy/Group: Individual Therapy  [facility] 08/29/2011, 3:30 PM

## 2011-08-29 NOTE — Progress Notes (Signed)
Subjective/Complaints: Still having pain from dinner time through evening.  Still significant leg pain Review of Systems  All other systems reviewed and are negative.      Objective: Vital Signs: Blood pressure 114/72, pulse 65, temperature 98.9 F (37.2 C), temperature source Oral, resp. rate 20, weight 234 lb 12.8 oz (106.505 kg), SpO2 92.00%. No results found. No results found for this basename: WBC:2,HGB:2,HCT:2,PLT:2 in the last 72 hours No results found for this basename: NA:2,K:2,CL:2,CO2:2,GLUCOSE:2,BUN:2,CREATININE:2,CALCIUM:2 in the last 72 hours CBG (last 3)  No results found for this basename: GLUCAP:3 in the last 72 hours  Wt Readings from Last 3 Encounters:  08/24/11 234 lb 12.8 oz (106.505 kg)  08/20/11 237 lb 4.8 oz (107.639 kg)  08/20/11 237 lb 4.8 oz (107.639 kg)   BP Readings from Last 3 Encounters:  08/29/11 114/72  08/24/11 141/80  08/24/11 141/80    Physical Exam:  General appearance: alert Head: Normocephalic, without obvious abnormality, atraumatic Eyes: conjunctivae/corneas clear. PERRL, EOM's intact. Fundi benign. Ears: normal TM's and external ear canals both ears Nose: Nares normal. Septum midline. Mucosa normal. No drainage or sinus tenderness. Throat: lips, mucosa, and tongue normal; teeth and gums normal Neck: no adenopathy, no carotid bruit, no JVD, supple, symmetrical, trachea midline and thyroid not enlarged, symmetric, no tenderness/mass/nodules Back: back tender, wearing lso Resp: clear to auscultation bilaterally Cardio: regular rate and rhythm, S1, S2 normal, no murmur, click, rub or gallop GI: soft, non-tender; bowel sounds normal; no masses,  no organomegaly Extremities: extremities normal, atraumatic, no cyanosis or edema Pulses: 2+ and symmetric Skin: Skin color, texture, turgor normal. No rashes or lesions Neurologic: Grossly normal  Moves all 4's.  No focal weakness but does have proximal pain inhibition weakness. No  resting tone Incision/Wound: back incision clean dry and intact   Assessment/Plan: 1. 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 Physiatrist is providing close team supervision and 24 hour management of active medical problems listed below. Physiatrist and rehab team continue to assess barriers to discharge/monitor patient progress toward functional and medical goals.  Complete IPOC today.  Mobility: Bed Mobility Bed Mobility: No Supine to Sit: 5: Supervision Sit to Supine - Right: 2: Max assist Transfers Transfers: Yes Sit to Stand: 4: Min assist Sit to Stand Details (indicate cue type and reason): focus on anterior weight shift and UE position to maintain back precautions and increase functional independence. Stand to Sit: 4: Min assist Stand to Sit Details: focus on increased hip flexion and UE position to maintain back precautions and increase functional independence. Decreased bilat LE eccentric control noted left > right. Ambulation/Gait Ambulation/Gait Assistance: 5: Supervision Ambulation/Gait Assistance Details (indicate cue type and reason): 2x 150 Ambulation Distance (Feet): 150 Feet Assistive device: Rolling walker Gait Pattern: Decreased step length - right;Decreased step length - left;Step-through pattern;Shuffle (wide base of support with bilateral toe out) Stairs: Yes Stairs Assistance: 4: Min assist Stairs Assistance Details (indicate cue type and reason): trialed different techniques for 2 steps without handrails.  pt required VC for rw use and preferred to put her hands on therapist shoulder and back up stairs. Stair Management Technique: No rails;Backwards Number of Stairs: 2  Height of Stairs: 4    ADL:   Cognition: Cognition Overall Cognitive Status: Appears within functional limits for tasks  assessed Arousal/Alertness: Awake/alert Orientation Level: Oriented X4 Cognition Arousal/Alertness: Awake/alert Orientation  Level: Oriented X4  2. DVT Prophylaxis/Anticoagulation: Mechanical:  Antiembolism stockings, knee (TED hose) Bilateral lower extremities Sequential compression devices, below knee Bilateral lower extremities Pharmaceutical: Lovenox  2. Pain Management: Prn oxycodone. robaxin prn for spasms. Scheduled robaxin didn't help.  Will add PM oxycontin 20mg  only at this point and observe her response.   3. Mood: Motivated to get better. Monitor for now.  Patient Active Hospital Problem List:  4. Asthma/Bronchitis: Resume symbicort. Monitor respiratory status for now. Use albuterol MDI prn SOB/ no problems currently. 5. Urinary retention: emptying bladder now with foley out.  No problems currently. 6. Hypothyroid: Continue supplemental synthyroid  7.GERD: On scheduled pepcid. Increased GI symtoms currently due to decreased moblility. Needs to be upright for meals.  8. Hypertension: Monitor on lasix daily. BID checks . Normotensive currently. 9. Hyperkalemia: Off supplement currently. Potassium normal 10. Constipation: bowel regimen       Rogelia Boga 08/29/2011, 10:14 AM

## 2011-08-29 NOTE — Progress Notes (Signed)
Physical Therapy Note  Patient Details  Name: TRAM WRENN MRN: [id number] Date of Birth: December 15, 1940 Today's Date: 08/29/2011  Time: 4098-1191 Precautions: Back Brace on at all times, Log Roll, Spinal w/ No Bending, Lifting, Twisting. Pain: Lower back ranging from 2-5 out of 10.  Nursing had medicated patient prior to P.T. Session.  Treatment session to include;     Therapeutic Activities including transfer training with instructions and vc's on hand placement for safety.  Instructions        regarding spinal precautions and avoiding bed mobility to scoot to Head of Bed using headboard    Gait Training; using RW x 150' with Superivision and instructions on monitoring fatigue level   Therapeutic Exercise; LE exercises in supine and sitting.  Individual Treatment Session.   Jodelle Gross 08/29/2011, 5:54 PM

## 2011-08-29 NOTE — Progress Notes (Signed)
Pt alert and oriented x 3- uses call bell appropriately. Patient continent of bowel and bladder with last bowel movement on 11/16. Patient able to ambulate with walker supervision. Patient pain managed well with 1 percocet given this morning at 0758. Patient back incision approximated with steri strips - New dry dressing applied. Patient has some brusies to BUE. Pt using MGP to folds. Patient wears corset brace OOB. Back precautions reinforced. Appetite good. Continue with plan of care.

## 2011-08-29 NOTE — Progress Notes (Signed)
Physical Therapy Session Note  Patient Details  Name: Jenny [name] MRN: [id number] Date of Birth: [birth year]  Today's Date: 08/29/2011 Time: 2440-1027 Time Calculation (min): 55 min  Precautions: Precautions Precautions: Back Precaution Comments: Avoid twisting, bending, arching Required Braces or Orthoses: Yes Spinal Brace: Lumbar corset Restrictions Weight Bearing Restrictions: No  Short Term Goals: PT Short Term Goal 1: pt will maintain 3/3 back precautions without cues during mobility activity PT Short Term Goal 2: pt will ambulate >164ft with RW and step-through gait pattern to allow for mod I home ambulation PT Short Term Goal 3: pt will demonstrate mod I stairs x 12 with bilat UE assist for improved bilat LE ROM/strength PT Short Term Goal 4: pt will consistently transfer with modified independence.  Skilled Therapeutic Interventions/Progress Updates: Gait group to improve ambulation safety/endurance using appropriate assistive device on level/steps           Pain No complaint of pain      Locomotion Gait 150 feet X 2 with rolling walker SBA for safety only/ up-down 2 steps with one rail sideways min assist                    Therapy/Group: Group Therapy  Willma Obando,[name] 08/29/2011, 2:20 PM

## 2011-08-30 NOTE — Progress Notes (Signed)
Pt alert and oriented x 3- uses call bell appropriately. Patient continent of bowel and bladder with last bowel movement on 11/17. Patient able to ambulate with walker supervision Pt has denied any need for pain medication this shift so far. Patient back incision approximated with steri strips - New dry dressing applied. Patient has some brusies to BUE. Pt using MGP to folds. Patient wears corset brace OOB. Back precautions reinforced. Appetite good. Continue with plan of care.

## 2011-08-30 NOTE — Progress Notes (Signed)
Physical Therapy Session Note  Patient Details  Name: Jenny [name] MRN: [id number] Date of Birth: [birth year]  Today's Date: 08/30/2011 Time: 4696-2952 Time Calculation (min): 55 min  Precautions: Precautions Precautions: Back Precaution Comments: Avoid twisting, bending, arching Required Braces or Orthoses: Yes Spinal Brace: Lumbar corset Restrictions Weight Bearing Restrictions: No  Short Term Goals: PT Short Term Goal 1: pt will maintain 3/3 back precautions without cues during mobility activity PT Short Term Goal 2: pt will ambulate >123ft with RW and step-through gait pattern to allow for mod I home ambulation PT Short Term Goal 3: pt will demonstrate mod I stairs x 12 with bilat UE assist for improved bilat LE ROM/strength PT Short Term Goal 4: pt will consistently transfer with modified independence.  Skilled Therapeutic Interventions/Progress Updates:   Ms. Alkema was seen today for walking group and was able to perform gait training with rolling walker, ascending and descending stairs with one rail sideways, sit to stand transfers and NuStep for strengthening and increasing activity tolerance.  She tolerated all activities well and is making excellent progress toward being modified independent with the rolling walker.     Pain  Patient reports pain is 3-4/10 in her back, but she does not want any pain meds.   Mobility  Patient able to perform sit to stand transfers with mod I with rolling walker.  She was able to perform toilet transfers with mod I with rolling walker. Locomotion   Patient able to walk with rolling walker x 150' x 2 and x 100' x 2 with S.  She was able to ascend and descend 5 steps with 1 rail with min A going up and down sideways.     Exercises  NuStep at level 5 maintaining 3 mets or greater x 5 min to increase strength and tolerance to activity.     Therapy/Group: Group Therapy  Mena Simonis,TAMMY 08/30/2011, 4:15 PM

## 2011-08-30 NOTE — Progress Notes (Signed)
Subjective/Complaints: Leg pain much improved; slept thru the night without pain until this am. Review of Systems  All other systems reviewed and are negative.      Objective: Vital Signs: Blood pressure 126/77, pulse 81, temperature 98.5 F (36.9 C), temperature source Oral, resp. rate 18, weight 234 lb 12.8 oz (106.505 kg), SpO2 95.00%. No results found. No results found for this basename: WBC:2,HGB:2,HCT:2,PLT:2 in the last 72 hours No results found for this basename: NA:2,K:2,CL:2,CO2:2,GLUCOSE:2,BUN:2,CREATININE:2,CALCIUM:2 in the last 72 hours CBG (last 3)  No results found for this basename: GLUCAP:3 in the last 72 hours  Wt Readings from Last 3 Encounters:  08/24/11 234 lb 12.8 oz (106.505 kg)  08/20/11 237 lb 4.8 oz (107.639 kg)  08/20/11 237 lb 4.8 oz (107.639 kg)   BP Readings from Last 3 Encounters:  08/30/11 126/77  08/24/11 141/80  08/24/11 141/80    Physical Exam:  General appearance: alert Head: Normocephalic, without obvious abnormality, atraumatic Eyes: conjunctivae/corneas clear. PERRL, EOM's intact. Fundi benign. Ears: normal TM's and external ear canals both ears Nose: Nares normal. Septum midline. Mucosa normal. No drainage or sinus tenderness. Throat: lips, mucosa, and tongue normal; teeth and gums normal Neck: no adenopathy, no carotid bruit, no JVD, supple, symmetrical, trachea midline and thyroid not enlarged, symmetric, no tenderness/mass/nodules Back: back tender, wearing lso Resp: clear to auscultation bilaterally Cardio: regular rate and rhythm, S1, S2 normal, no murmur, click, rub or gallop GI: soft, non-tender; bowel sounds normal; no masses,  no organomegaly Extremities: extremities normal, atraumatic, no cyanosis or edema Pulses: 2+ and symmetric Skin: Skin color, texture, turgor normal. No rashes or lesions Neurologic: Grossly normal  Moves all 4's.  No focal weakness but does have proximal pain inhibition weakness. No  resting tone Incision/Wound: back incision clean dry and intact   Assessment/Plan: 1. 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 Physiatrist is providing close team supervision and 24 hour management of active medical problems listed below. Physiatrist and rehab team continue to assess barriers to discharge/monitor patient progress toward functional and medical goals.  Complete IPOC today.  Mobility: Bed Mobility Bed Mobility: No Supine to Sit: 5: Supervision Sit to Supine - Right: 2: Max assist Transfers Transfers: Yes Sit to Stand: 4: Min assist Sit to Stand Details (indicate cue type and reason): focus on anterior weight shift and UE position to maintain back precautions and increase functional independence. Stand to Sit: 4: Min assist Stand to Sit Details: focus on increased hip flexion and UE position to maintain back precautions and increase functional independence. Decreased bilat LE eccentric control noted left > right. Ambulation/Gait Ambulation/Gait Assistance: 5: Supervision Ambulation/Gait Assistance Details (indicate cue type and reason): 2x 150 Ambulation Distance (Feet): 150 Feet Assistive device: Rolling walker Gait Pattern: Decreased step length - right;Decreased step length - left;Step-through pattern;Shuffle (wide base of support with bilateral toe out) Stairs: Yes Stairs Assistance: 4: Min assist Stairs Assistance Details (indicate cue type and reason): trialed different techniques for 2 steps without handrails.  pt required VC for rw use and preferred to put her hands on therapist shoulder and back up stairs. Stair Management Technique: No rails;Backwards Number of Stairs: 2  Height of Stairs: 4    ADL:   Cognition: Cognition Overall Cognitive Status: Appears within functional limits for tasks  assessed Arousal/Alertness: Awake/alert Orientation Level: Oriented X4 Cognition Arousal/Alertness: Awake/alert  Orientation Level: Oriented X4  2. DVT Prophylaxis/Anticoagulation: Mechanical:  Antiembolism stockings, knee (TED hose) Bilateral lower extremities Sequential compression devices, below knee Bilateral lower extremities Pharmaceutical: Lovenox  2. Pain Management: Prn oxycodone. robaxin prn for spasms. Scheduled robaxin didn't help.  Will add PM oxycontin 20mg  only at this point and observe her response.   3. Mood: Motivated to get better. Monitor for now.  Patient Active Hospital Problem List:  4. Asthma/Bronchitis: Resume symbicort. Monitor respiratory status for now. Use albuterol MDI prn SOB/ no problems currently. 5. Urinary retention: emptying bladder now with foley out.  No problems currently. 6. Hypothyroid: Continue supplemental synthyroid  7.GERD: On scheduled pepcid. Increased GI symtoms currently due to decreased moblility. Needs to be upright for meals.  8. Hypertension: Monitor on lasix daily. BID checks . Normotensive currently. 9. Hyperkalemia: Off supplement currently. Potassium normal 10. Constipation: bowel regimen       Rogelia Boga 08/30/2011, 9:32 AM

## 2011-08-31 NOTE — Progress Notes (Signed)
Occupationall Therapy Note  Patient Details  Name: Jenny [name] MRN: [id number] Date of Birth: [birth year] Today's Date: 08/31/2011  1510 - 1600 50 min  Skilled Intervention: Functional ambulation at [employer] level while managing objects. Strengthening and endurance activities with and without RW in standing to work on dynamic standing balance and core strength. Gave theraband for own use and stressed importance of keeping banc below shoulder level. Gien handouts on exercises. Ambulated back and forth from ADL apt to room with RW. Also practiced walk in shower at RW level. Recommended pt to get suction cup grab bar for daughter's shower. Excellent participation.  Individual session.  Kapena Hamme,HILLARY 08/31/2011, 4:02 PM

## 2011-08-31 NOTE — Progress Notes (Signed)
Patient Details  Name: Jenny [name] MRN: [id number] Date of Birth: Nov 11, 1940  Today's Date: 08/31/2011 Time: 11:15-11:45  Skilled Therapeutic Interventions/Progress Updates:  Pt discussed Thanksgiving plans/traditions and expressed desire to assist in meal prep this year.  Discussed safety concerns, pt able to identify energy conservation techniques for safe task completion.  Pt ambulating with RW with supervision.  No c/o pain. Therapy/Group: Individual Therapy  Activity Level: Simple:  Level of assist: Supervision  [name] 08/31/2011, 3:39 PM

## 2011-08-31 NOTE — Progress Notes (Signed)
Pt ambulating with walker with supervision. Pain controlled with current medication regemin. Pt continent of bowel and bladder. Held Lasix AM dose per patient request due to uncontrolled urination during therapy. Participating with therapy. Continue plan of care.

## 2011-08-31 NOTE — Progress Notes (Signed)
Physical Therapy Session Note  Patient Details  Name: Jenny [name] MRN: [id number] Date of Birth: [birth year]  Today's Date: 08/31/2011 Time: 0900-0940 Time Calculation (min): 40 min  Precautions: Precautions Precautions: Back Precaution Comments: Avoid twisting, bending, arching Required Braces or Orthoses: Yes Spinal Brace: Lumbar corset Restrictions Weight Bearing Restrictions: No  Short Term Goals: PT Short Term Goal 1: pt will maintain 3/3 back precautions without cues during mobility activity PT Short Term Goal 2: pt will ambulate >159ft with RW and step-through gait pattern to allow for mod I home ambulation PT Short Term Goal 3: pt will demonstrate mod I stairs x 12 with bilat UE assist for improved bilat LE ROM/strength PT Short Term Goal 4: pt will consistently transfer with modified independence.  Skilled Therapeutic Interventions/Progress Updates: Focus of treatment: Gait training in controlled environment with AD on level surfaces; up/down flight of steps to assess safety using one rail; and Nustep to improve tolerance to activity         Therapy Vitals BP: 134/88 mmHg (manual recheck) Oxygen Therapy SpO2: 96 % O2 Device: None (Room air) Pain Pain Assessment Pain Assessment: No/denies pain Pain Score: 0-No pain    Locomotion Gait with Rw 150 feet X 2 (to/from room) SBA for safety only; up/down 12 steps with one rail sideways close SBA for safety and vcs to prevent trunk twisting Ambulation Ambulation/Gait Assistance: 5: Supervision               Other Treatments  Nustep Level 5 X 5 minutes (LEs only); O2 sats post activity 94% RA; PE= 13 (to improve tolerance to activity)  Therapy/Group: Individual Therapy  [name],[name] 08/31/2011, 9:42 AM

## 2011-08-31 NOTE — Progress Notes (Signed)
Subjective/Complaints: Leg pain much improved; slept thru the night without pain until this am. Occasional HS foot pain (not nerve pain she claims) tramadol causes itiching. Review of Systems  All other systems reviewed and are negative.      Objective: Vital Signs: Blood pressure 134/88, pulse 85, temperature 97.7 F (36.5 C), temperature source Oral, resp. rate 19, weight 106.505 kg (234 lb 12.8 oz), SpO2 100.00%. No results found. No results found for this basename: WBC:2,HGB:2,HCT:2,PLT:2 in the last 72 hours No results found for this basename: NA:2,K:2,CL:2,CO2:2,GLUCOSE:2,BUN:2,CREATININE:2,CALCIUM:2 in the last 72 hours CBG (last 3)  No results found for this basename: GLUCAP:3 in the last 72 hours  Wt Readings from Last 3 Encounters:  08/24/11 106.505 kg (234 lb 12.8 oz)  08/20/11 107.639 kg (237 lb 4.8 oz)  08/20/11 107.639 kg (237 lb 4.8 oz)   BP Readings from Last 3 Encounters:  08/31/11 134/88  08/24/11 141/80  08/24/11 141/80    Physical Exam:  General appearance: alert Head: Normocephalic, without obvious abnormality, atraumatic Eyes: conjunctivae/corneas clear. PERRL, EOM's intact. Fundi benign. Ears: normal TM's and external ear canals both ears Nose: Nares normal. Septum midline. Mucosa normal. No drainage or sinus tenderness. Throat: lips, mucosa, and tongue normal; teeth and gums normal Neck: no adenopathy, no carotid bruit, no JVD, supple, symmetrical, trachea midline and thyroid not enlarged, symmetric, no tenderness/mass/nodules Back: back tender, wearing lso Resp: clear to auscultation bilaterally Cardio: regular rate and rhythm, S1, S2 normal, no murmur, click, rub or gallop GI: soft, non-tender; bowel sounds normal; no masses,  no organomegaly Extremities: extremities normal, atraumatic, no cyanosis or edema Pulses: 2+ and symmetric Skin: Skin color, texture, turgor normal. No rashes or lesions Neurologic: Grossly normal  Moves all  4's.  No focal weakness but does have proximal pain inhibition weakness. No resting tone Incision/Wound: back incision clean dry and intact   Assessment/Plan: 1. 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 Physiatrist is providing close team supervision and 24 hour management of active medical problems listed below. Physiatrist and rehab team continue to assess barriers to discharge/monitor patient progress toward functional and medical goals.    Mobility: Bed Mobility Bed Mobility: No Supine to Sit: 5: Supervision Sit to Supine - Right: 2: Max assist Transfers Transfers: Yes Sit to Stand: 4: Min assist Sit to Stand Details (indicate cue type and reason): focus on anterior weight shift and UE position to maintain back precautions and increase functional independence. Stand to Sit: 4: Min assist Stand to Sit Details: focus on increased hip flexion and UE position to maintain back precautions and increase functional independence. Decreased bilat LE eccentric control noted left > right. Ambulation/Gait Ambulation/Gait Assistance: 5: Supervision Ambulation/Gait Assistance Details (indicate cue type and reason): 2x 150 Ambulation Distance (Feet): 150 Feet Assistive device: Rolling walker Gait Pattern: Decreased step length - right;Decreased step length - left;Step-through pattern;Shuffle (wide base of support with bilateral toe out) Stairs: Yes Stairs Assistance: 4: Min assist Stairs Assistance Details (indicate cue type and reason): trialed different techniques for 2 steps without handrails.  pt required VC for rw use and preferred to put her hands on therapist shoulder and back up stairs. Stair Management Technique: No rails;Backwards Number of Stairs: 2  Height of Stairs: 4    ADL:   Cognition: Cognition Overall  Cognitive Status: Appears within functional limits for  tasks assessed Arousal/Alertness: Awake/alert Orientation Level: Oriented X4 Cognition Arousal/Alertness: Awake/alert Orientation Level: Oriented X4  2. DVT Prophylaxis/Anticoagulation: Mechanical:  Antiembolism stockings, knee (TED hose) Bilateral lower extremities Sequential compression devices, below knee Bilateral lower extremities Pharmaceutical: Lovenox  2. Pain Management: Prn oxycodone. robaxin prn for spasms. Scheduled robaxin didn't help.  Pm Oxy CR helpful.  Will d/c tramadol as it causes itiching. Foot pain? Arthritic.   3. Mood: Motivated to get better. Monitor for now.  Patient Active Hospital Problem List:  4. Asthma/Bronchitis: Resume symbicort. Monitor respiratory status for now. Use albuterol MDI prn SOB/ no problems currently. 5. Urinary retention: emptying bladder now with foley out.  No problems currently. 6. Hypothyroid: Continue supplemental synthyroid.  Out pt f/u 7.GERD: On scheduled pepcid. Increased GI symtoms currently due to decreased moblility. Needs to be upright for meals.  8. Hypertension: Monitor on lasix daily. BID checks . Normotensive currently. 9. Hyperkalemia: Off supplement currently. Potassium normal 10. Constipation: bowel regimen effective.       Raun Routh T 08/31/2011, 7:33 AM

## 2011-08-31 NOTE — Progress Notes (Signed)
Physical Therapy Session Note  Patient Details  Name: Jenny [name] MRN: [id number] Date of Birth: 07-02-1941  Today's Date: 08/31/2011 Time: 4098-1191 Time Calculation (min): 45 min  Precautions: Precautions Precautions: Back Precaution Comments: Avoid twisting, bending, arching Required Braces or Orthoses: Yes Spinal Brace: Lumbar corset Restrictions Weight Bearing Restrictions: No  Short Term Goals: PT Short Term Goal 1: pt will maintain 3/3 back precautions without cues during mobility activity PT Short Term Goal 2: pt will ambulate >113ft with RW and step-through gait pattern to allow for mod I home ambulation PT Short Term Goal 3: pt will demonstrate mod I stairs x 12 with bilat UE assist for improved bilat LE ROM/strength PT Short Term Goal 4: pt will consistently transfer with modified independence.  Skilled Therapeutic Interventions/Progress Updates: Focus of treatment: Gait training without assistive device to facilitate balance reactions; Therapeutic activity to improve standing tolerance without assistive device.          Pain No complaint of pain Pain Assessment Pain Assessment: No/denies pain    Locomotion Gait 50 feet X 3 without assistive device min assist. Pt reports feeling unsteady.                 Other Treatments  Standing tolerance - pt participated in [employer] bowling standing approximately 15 minutes without assistive device. No c/o increased pain  Therapy/Group: Individual Therapy  Rhiann Boucher,[name] 08/31/2011, 2:49 PM

## 2011-08-31 NOTE — Progress Notes (Signed)
Per State Regulation 482.30 This chart was reviewed for medical necessity with respect to the patient's Admission/Duration of stay. Pt reaching her goals.  Pain better managed & sleep better.  ELOS: 09/02/11   Brock Ra                 Nurse Care Manager              Next Review Date: none

## 2011-08-31 NOTE — Progress Notes (Signed)
Occupational Therapy Session Note  Patient Details  Name: Jenny [name] MRN: [id number] Date of Birth: June 18, 1941  Today's Date: 08/31/2011 Time: 1020-1120 Time Calculation (min): 60 min  Precautions: Precautions Precautions: Back Precaution Comments: Avoid twisting, bending, arching Required Braces or Orthoses: Yes Spinal Brace: Lumbar corset Restrictions Weight Bearing Restrictions: No  Long Term Goals: modified independent with bathing, dressing and supervision with shower transfer    Skilled Therapeutic Interventions/Progress Updates:  Pt seen for ADL training of bathing and dressing at shower level with a focus on functional mobility, dynamic balance, use of DME/AE to maintain back precautions.   Pt only required distant supervision with ambulation in room and transferring to shower with walker.  Pt used AE well with no cues.  Mod independent with bathing and dressing. Pt will use a walk in shower at home.     General General Chart Reviewed: Yes    Pain Pain Assessment Pain Assessment: No/denies pain ADL ADL Equipment Provided: Reacher;Sock aid Grooming: Independent Where Assessed-Grooming: Standing at sink Upper Body Bathing: Supervision/safety Where Assessed-Upper Body Bathing: Shower Lower Body Bathing: Modified independent Where Assessed-Lower Body Bathing: Shower Upper Body Dressing: Independent Where Assessed-Upper Body Dressing: Chair Lower Body Dressing: Modified independent Where Assessed-Lower Body Dressing: Chair Toileting: Not assessed Toilet Transfer: Not assessed Toilet Transfer Method: Not assessed Tub/Shower Transfer: Not assessed Psychologist, counselling Transfer: Distant supervision Film/video editor Method: Designer, industrial/product: Emergency planning/management officer       Therapy/Group: Individual Therapy  Ross Bender 08/31/2011, 11:48 AM

## 2011-09-01 MED ORDER — POLYETHYLENE GLYCOL 3350 17 G PO PACK: 17.0000 g | PACK | Freq: Every day | ORAL | Status: AC

## 2011-09-01 MED ORDER — OXYCODONE HCL 20 MG PO TB12: 20.0000 mg | ORAL_TABLET | Freq: Every day | ORAL | Status: AC

## 2011-09-01 MED ORDER — METHOCARBAMOL 500 MG PO TABS: 500.0000 mg | ORAL_TABLET | Freq: Four times a day (QID) | ORAL | Status: AC | PRN

## 2011-09-01 MED ORDER — FUROSEMIDE 40 MG PO TABS
40.0000 mg | ORAL_TABLET | Freq: Every day | ORAL | Status: DC | PRN
  Filled 2011-09-01: qty 1

## 2011-09-01 MED ORDER — OXYCODONE-ACETAMINOPHEN 5-325 MG PO TABS: 1.0000 | ORAL_TABLET | ORAL | Status: AC | PRN

## 2011-09-01 NOTE — Progress Notes (Signed)
Occupational Therapy Discharge Summary and Session Note  Patient Details  Name: Jenny [name] MRN: [id number] Date of Birth: [birth year] Today's Date: 09/01/2011  Session Note: Time: 1500-1525 Time Calculation: 25 Minutes Individual Therapy No complaints of pain Skilled therapy to focus on toilet transfer onto elevated toilet seat at modified independent level using rolling walker. Also focused on functional mobility throughout room and hallway using rolling walker. Discussed safety for discharge to daughters home and home.   Discharge Summary Patient has met 9 of 9 long term goals due to improved activity tolerance, improved balance and improved awareness.  Patient's care partner is independent to provide the necessary physical assistance at discharge. Initially patient does plan to discharge to her daughters house. Patient is performing at an overall modified independent level for aspects of daily living.   Recommendation:  No additional occupational therapy recommended at this time.  Equipment: No equipment provided  Patient/family agrees with progress made and goals achieved: Yes  Hisae Decoursey 09/01/2011, 3:40 PM

## 2011-09-01 NOTE — Discharge Summary (Signed)
NAMERASHANA, ANDREW NO.:  [id number]  MEDICAL RECORD NO.:  [id number]  LOCATION:  4008                         FACILITY:  [facility]  PHYSICIAN:  Ranelle Oyster, M.D.DATE OF BIRTH:  10-19-1940  DATE OF ADMISSION:  08/24/2011 DATE OF DISCHARGE:  09/02/2011                              DISCHARGE SUMMARY   DISCHARGE DIAGNOSES: 1. Lumbar spondylolisthesis with stenosis requiring decompression. 2. Obesity. 3. Hypothyroid. 4. Vitamin D deficiency. 5. Hypertension. 6. Acute blood loss anemia. 7. Chronic obstructive pulmonary disease. 8. Hypokalemia, resolved off supplement.  HISTORY OF PRESENT ILLNESS:  [name] is a 70 year old female with history of obesity, DDD with cervical decompression a few months ago and back pain with radiation to bilateral lower extremity due to L4- L5 and L5-S1 spondylolisthesis and stenosis.  The patient elected to undergo bilateral L3-L4 laminectomy with facetectomies and fusion by Dr. Jeral Fruit on August 29, 2011.  Postop has had issues with back pain as well as right lower extremity coordination.  She requires max cues for posture.  The patient was evaluated by rehab, and we felt that she would benefit from a CIR program.  PAST MEDICAL HISTORY:  Significant for: 1. Allergic rhinitis. 2. Bronchitis. 3. Venous insufficiency. 4. Hypothyroidism. 5. Obesity. 6. GERD. 7. Diverticulosis. 8. DM, type 2. 9. Dysthymia. 10.Depression. 11.Obstructive sleep apnea. 12.Peripheral neuropathy. 13.Asthmatic bronchitis.  REVIEW OF SYSTEMS:  Positive for a chronic cough and back pain and myalgias as well as numbness in bilateral hands and weakness.  Tends to bleed and bruise easily.  Also complains of spasms in bilateral lower extremities.  FAMILY HISTORY:  Positive for COPD, heart disease, and dementia.  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  Reports  she quit smoking 20+ years ago.  Used cigarettes.  She has a 45-pack-year smoking history.  She has never used smokeless tobacco.  Does not use any alcohol or illicit drugs.  Has a supportive family that can provide supervision past discharge.  Lives in one-level home with one-step at entry.  ALLERGIES:  CODEINE and PREDNISONE.  FUNCTIONAL HISTORY:  Was independent with home making and ambulation prior to admission.  Used walker for ambulation.  Worked full-time.  PHYSICAL EXAMINATION:  GENERAL:  The patient is obese female, well nourished, well developed, in no acute distress. HEENT:  Extraocular movements intact.  Pupils equal, round, reactive to light.  Oral mucosa is pink and moist with fair dentition.  Atraumatic, normocephalic. NECK:  Supple without masses. HEART:  Regular rate and rhythm without murmurs or gallops. LUNGS:  Wheezes in upper airways with congested cough noted occasionally. ABDOMEN:  Soft, nontender with decreased bowel sounds. EXTREMITIES:  No evidence of clubbing, cyanosis, or edema. NEUROLOGIC:  The patient is alert and oriented x3.  Reports decreased sensation in bilateral hands.  Follows commands without difficulty. SKIN:  Back incision is clean, dry, and intact with Steri-Strips in place.  Ecchymosis in mid back and bilateral upper extremity.  HOSPITAL COURSE:  [name] was admitted to rehab on August 24, 2011, for inpatient therapies to consist of PT, OT at least 3 hours 5 days  a week.  Past admission, physiatrist, rehab, RN, and therapy team have worked together to provide customized collaborative interdisciplinary care.  Rehab RN has worked with the patient on bowel and bladder program as well as close monitoring of skin for prevention of breakdown and wound care monitoring.  The patient's blood pressures have been checked on b.i.d. basis and these are currently reasonable ranging from 130s to 140s systolics, 70s to 150s diastolic.  The  patient had a Foley in place which was discontinued past admission and voiding trial was initiated.  The patient was noted to be voiding without difficulty.  LABORATORY DATA:  Done past admission for followup showing acute blood loss anemia with hemoglobin 10.3, hematocrit 32.6 white count 8.7, platelets 322.  Check of electrolytes revealed sodium 137, potassium 4.1, chloride 98, CO2 of 31, BUN 13, creatinine 0.78, glucose 111.  The patient was noted to have hypokalemia at admission with labs of August 14, 2011, showing potassium at 5.3, therefore, the patient's potassium supplement was discontinued.  Recommend the patient having followup electrolytes checked by primary MD in a couple of weeks past discharge. The patient's back incision has been monitored along and this was noted to be healing well without any signs or symptoms of infection.  During the patient's stay in rehab, weekly team conferences were held to monitor the patient's progress, set goals, as well as discuss barriers to discharge.  Physical therapy has been working on the patient with focus on gait strengthening and safety.  The patient has made good progress during her stay.  She is currently at modified independent level for transfers.  She is able to ambulate up to 200 feet x2 with rolling walker with supervision for activity tolerance.  She is showing good posture.  She requires supervision to navigate 10 stairs with 1 rail.  OT has been working with the patient on self-care tasks. Currently, the patient is modified independent for ADL tasks with improved activity tolerance, improved balance, and improved awareness. She is modified independent for toilet transfers.  Safety for discharge has been discussed with daughter and family education was done.  Distant supervision is recommended for community ambulation.  Further followup home health therapies to continue past discharge.  On September 02, 2011, the patient is  discharged to home.  DISCHARGE MEDICATIONS: 1. Symbicort 1 puff b.i.d. 2. Pepcid 10 mg p.o. per day. 3. Levothyroxine 125 mcg p.o. per day. 4. Robaxin 1000 mg p.o. at bedtime. 5. OxyContin CR 20 mg p.o. per day. 6. MiraLAX 17 g in 8 ounces p.o. per day. 7. Tylenol 325-650 mg p.o. q.4 h. p.r.n. pain. 8. Percocet 5/325 one p.o. q.4 h. p.r.n. pain #60 prescribed. 9. Xanax 0.5 mg p.o. t.i.d. p.r.n. anxiety.  DIET:  Regular.  ACTIVITY LEVEL:  Intermittent supervision, ambulate with use of walker, and no driving.  FOLLOWUP:  The patient is to follow up with Dr. Jeral Fruit for postop check, follow up with Dr. Abner Greenspan in 2 weeks for routine check and for check of electrolytes.  Follow up with Dr. Riley Kill as needed.     Delle Reining, P.A.   ______________________________ Ranelle Oyster, M.D.    PL/MEDQ  D:  09/01/2011  T:  09/01/2011  Job:  [id number]  cc:   Hilda Lias, M.D. Danise Edge, MD

## 2011-09-01 NOTE — Progress Notes (Signed)
Occupational Therapy Session Note  Patient Details  Name: Jenny [name] MRN: [id number] Date of Birth: 1941-07-23  Today's Date: 09/01/2011 Time:  0940- 1035 Time Calculation: 55 min    Precautions: Precautions Precautions: Back Precaution Comments: Avoid twisting, bending, arching Required Braces or Orthoses: Yes Spinal Brace: Lumbar corset Restrictions Weight Bearing Restrictions: No      Skilled Therapeutic Interventions/Progress Updates:    ADL retraining including bathing and dressing at shower level.  Functional ambulation with r/w to bathroom for shower and dressing.   Pt standing at sink independently while performing grooming task.  Pt  Mod I/ independently with ADL tasks.  Focused on functional ambulation with r/w, dynamic sitting/standing balance, and activity tolerance.        Pain Pain Assessment Pain Assessment: No/denies pain Pain Score: 0-No pain  Therapy/Group: Individual Therapy  [name] 09/01/2011, 10:41 AM

## 2011-09-01 NOTE — Patient Care Conference (Signed)
Inpatient RehabilitationTeam Conference Note Date: 09/01/2011   Time: 5:27 PM    Patient Name: Jenny [name]      Medical Record Number: [id number]  Date of Birth: [birth year] Sex: Female         Room/Bed: 4008/4008-01 Payor Info: Payor: MEDICARE  Plan: MEDICARE PART A AND B  Product Type: *No Product type*     Admitting Diagnosis: lumbar fusion  Admit Date/Time:  08/24/2011  3:05 PM Admission Comments: No comment available   Primary Diagnosis:  <principal problem not specified> Principal Problem: <principal problem not specified>  Patient Active Problem List  Diagnoses Date Noted  . 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  . Vitamin D deficiency 02/12/2011  . Peripheral neuropathy 02/12/2011  . Intertrigo 01/21/2011  . Asthma 01/14/2011  . 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 History of chicken pox 01/14/2011  . History of measles 01/14/2011  . HAND PAIN 04/09/2010  . OTHER URINARY INCONTINENCE 03/12/2010  . DIABETES MELLITUS, BORDERLINE 08/21/2008  . 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  . HYPERTENSION 02/22/2008  . BRONCHITIS, RECURRENT 02/22/2008  . DIVERTICULOSIS OF COLON 02/22/2008  . DEGENERATIVE JOINT DISEASE 02/22/2008  . HYPOTHYROIDISM 08/23/2007  . OBESITY 08/23/2007  . SLEEP APNEA, OBSTRUCTIVE 08/23/2007  . VENOUS INSUFFICIENCY 08/23/2007  . ALLERGIC RHINITIS 08/23/2007  . GERD 08/23/2007  . BACK PAIN, LUMBAR 08/23/2007    Expected Discharge Date: Expected Discharge Date: 09/02/11  Team Members Present: Physician: Dr. Faith Rogue Case Manager Present: Melanee Spry, RN Social Worker Present: Amada Jupiter, LCSW PT Present: Karolee Stamps, Judith Blonder, PTA OT Present: Edwin Cap, OT Other (Discipline and Name): Tora Duck, PPS Coordinator RN Present: Daryll Brod    Current Status/Progress Goal Weekly Team Focus  Medical   Improved pain control. She is sleeping better at night. Bowel bladder are functioning quite nicely.  Continue same goals        Bowel/Bladder     Continent        Swallow/Nutrition/ Hydration             ADL's   Overall Modified Independent   Overall Modified Independent  D/C Planning for D/C to home 11/21; goals met   Mobility   S/ modified independent overall  S/ modified independent overall  d/c planning   Communication             Safety/Cognition/ Behavioral Observations            Pain             Skin                *See Interdisciplinary Assessment and Plan and progress notes for long and short-term goals  Barriers to Discharge: None at present    Possible Resolutions to Barriers:       Discharge Planning/Teaching Needs:  home with family to assist as needed      Team Discussion:  Pt made Modified Independent in her room-ready for d/c tomorrow  Revisions to Treatment Plan: none   Continued Need for Acute Rehabilitation Level of Care: The patient requires daily medical management by a physician with specialized training in physical medicine and rehabilitation for the following conditions: Daily direction of a multidisciplinary physical rehabilitation program to ensure safe treatment while eliciting the highest outcome that is of practical value to the patient.: Yes Daily medical management of patient stability for increased activity during participation in an intensive rehabilitation regime.: Yes Daily analysis of laboratory values and/or radiology reports with any subsequent need for  medication adjustment of medical intervention for : Post surgical problems;Other  Spoke w/ pt after conference.  She is very pleased w/ her progress & says she is ready to go home.  Brock Ra 09/01/2011, 5:27 PM

## 2011-09-01 NOTE — Progress Notes (Addendum)
Physical Therapy Session Note  Patient Details  Name: Jenny [name] MRN: [id number] Date of Birth: [birth year]  Today's Date: 09/01/2011 Time: 1120-1203 Time Calculation (min): 43 min  Precautions: Precautions Precautions: Back Precaution Comments: Avoid twisting, bending, arching Required Braces or Orthoses: Yes Spinal Brace: Lumbar corset Restrictions Weight Bearing Restrictions: No  Short Term Goals: PT Short Term Goal 1: pt will maintain 3/3 back precautions without cues during mobility activity PT Short Term Goal 2: pt will ambulate >159ft with RW and step-through gait pattern to allow for mod I home ambulation PT Short Term Goal 3: pt will demonstrate mod I stairs x 12 with bilat UE assist for improved bilat LE ROM/strength PT Short Term Goal 4: pt will consistently transfer with modified independence.  Skilled Therapeutic Interventions/Progress Updates: Tx focused on increasing activity tolerance, LE strengthening, and stair/curb training for community and home access. Pt demo'd safe technique with all mobility and gait, except needing cues for hand placement stand>sit. Educated pt on importance of frequently changing positions each hour and straight-back chairs.       Pain Pain Assessment Pain Assessment: No/denies pain Mobility: sit>stand S with cues for hand placement, but pt chooses to stand with hands on RW while sitting efge of chair due to "weak spot in back." stand>sit S with cues for hand placement. Stand-pivot transfer with S and safe technique noted, cues for RW mangement when approaching stairs. Repeated sit<>stand x3 from couch and varying chairs for practice and strengthening.    Locomotion : 2x200' with RW and S for activity tolerance. Pt demos good posture and RW technique. Practiced gait with no device for strengthening: 1x40' with Min A for steadying, trandelenburg noted L in stance. Advised pt to continue using RW at all times.  stairs: up/down curb with  RW, CGA>S with cues for technique and sequence x3.  Up/down 10 steps with 1 rail, S, sideways to reinforce learning from prior tx. Cues needed for step sequence as pt attempted to cross legs to descend,           Exercises General Exercises - Lower Extremity Hip ABduction/ADduction: AROM;Strengthening;Both;10 reps;Standing Toe Raises: AROM;Strengthening;Both;15 reps;Standing Heel Raises: AROM;Strengthening;Both;15 reps;Standing Mini-Sqauts: AROM;Strengthening;Both;10 reps;Standing Standing Knee Flexion: AROM;Strengthening;Both;10 reps (HS strengthening) Nu step x7 min for strengthening, level 4 due to pt report increased soreness after last tx on Nu step. LEs only with no pain.   Therapy/Group: Individual Therapy  Iona Coach 09/01/2011, 12:22 PM

## 2011-09-01 NOTE — Progress Notes (Signed)
Patient ID: Jenny [name], female   DOB: [birth year], 70 y.o.   MRN: [id number]        Subjective/Complaints: Leg pain much improved; slept thru the night without pain until this am. Occasional HS foot pain (not nerve pain she claims)  Urinary frequency last night due to lasix Review of Systems  All other systems reviewed and are negative.      Objective: Vital Signs: Blood pressure 113/76, pulse 79, temperature 97.8 F (36.6 C), temperature source Oral, resp. rate 20, weight 106.505 kg (234 lb 12.8 oz), SpO2 97.00%. No results found. No results found for this basename: WBC:2,HGB:2,HCT:2,PLT:2 in the last 72 hours No results found for this basename: NA:2,K:2,CL:2,CO2:2,GLUCOSE:2,BUN:2,CREATININE:2,CALCIUM:2 in the last 72 hours CBG (last 3)  No results found for this basename: GLUCAP:3 in the last 72 hours  Wt Readings from Last 3 Encounters:  08/24/11 106.505 kg (234 lb 12.8 oz)  08/20/11 107.639 kg (237 lb 4.8 oz)  08/20/11 107.639 kg (237 lb 4.8 oz)   BP Readings from Last 3 Encounters:  09/01/11 113/76  08/24/11 141/80  08/24/11 141/80    Physical Exam:  General appearance: alert Head: Normocephalic, without obvious abnormality, atraumatic Eyes: conjunctivae/corneas clear. PERRL, EOM's intact. Fundi benign. Ears: normal TM's and external ear canals both ears Nose: Nares normal. Septum midline. Mucosa normal. No drainage or sinus tenderness. Throat: lips, mucosa, and tongue normal; teeth and gums normal Neck: no adenopathy, no carotid bruit, no JVD, supple, symmetrical, trachea midline and thyroid not enlarged, symmetric, no tenderness/mass/nodules Back: back tender, wearing lso Resp: clear to auscultation bilaterally Cardio: regular rate and rhythm, S1, S2 normal, no murmur, click, rub or gallop GI: soft, non-tender; bowel sounds normal; no masses,  no organomegaly Extremities: extremities normal, atraumatic, no cyanosis or edema Pulses: 2+ and symmetric Skin:  Skin color, texture, turgor normal. No rashes or lesions Neurologic: Grossly normal  Moves all 4's.  No focal weakness but does have proximal pain inhibition weakness. No resting tone Incision/Wound: back incision clean dry and intact   Assessment/Plan: 1. 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 Physiatrist is providing close team supervision and 24 hour management of active medical problems listed below. Physiatrist and rehab team continue to assess barriers to discharge/monitor patient progress toward functional and medical goals.  Team conference today.  Probably ready to go home soon.    Mobility: Bed Mobility Bed Mobility: No Supine to Sit: 5: Supervision Sit to Supine - Right: 2: Max assist Transfers Transfers: Yes Sit to Stand: 4: Min assist Sit to Stand Details (indicate cue type and reason): focus on anterior weight shift and UE position to maintain back precautions and increase functional independence. Stand to Sit: 4: Min assist Stand to Sit Details: focus on increased hip flexion and UE position to maintain back precautions and increase functional independence. Decreased bilat LE eccentric control noted left > right. Ambulation/Gait Ambulation/Gait Assistance: 5: Supervision Ambulation/Gait Assistance Details (indicate cue type and reason): 2x 150 Ambulation Distance (Feet): 150 Feet Assistive device: Rolling walker Gait Pattern: Decreased step length - right;Decreased step length - left;Step-through pattern;Shuffle (wide base of support with bilateral toe out) Stairs: Yes Stairs Assistance: 4: Min assist Stairs Assistance Details (indicate cue type and reason): trialed different techniques for 2 steps without handrails.  pt required VC for rw use and preferred to put her hands on therapist shoulder and  back up stairs. Stair Management Technique: No rails;Backwards Number of Stairs: 2  Height of Stairs: 4    ADL:   Cognition: Cognition Overall Cognitive Status: Appears within functional limits for tasks assessed Arousal/Alertness: Awake/alert Orientation Level: Oriented X4 Cognition Arousal/Alertness: Awake/alert Orientation Level: Oriented X4  2. DVT Prophylaxis/Anticoagulation: Mechanical:  Antiembolism stockings, knee (TED hose) Bilateral lower extremities Sequential compression devices, below knee Bilateral lower extremities Pharmaceutical: Lovenox  2. Pain Management: Prn oxycodone. robaxin prn for spasms. Scheduled robaxin didn't help.  Pm Oxy CR helpful.  Will d/c tramadol as it causes itiching. Foot pain? Arthritic.   3. Mood: Motivated to get better. Monitor for now.  Patient Active Hospital Problem List:  4. Asthma/Bronchitis: Resume symbicort. Monitor respiratory status for now. Use albuterol MDI prn SOB/ no problems currently. 5. Urinary retention: emptying bladder now with foley out.  No problems currently. 6. Hypothyroid: Continue supplemental synthyroid.  Out pt f/u 7.GERD: On scheduled pepcid. Increased GI symtoms currently due to decreased moblility. Needs to be upright for meals.  8. Hypertension: Change lasix to prn only as this is how she takes it at home. BID checks . Normotensive currently. 9. Hyperkalemia: Off supplement currently. Potassium normal 10. Constipation: bowel regimen effective.       Delaine Canter T 09/01/2011, 7:23 AM

## 2011-09-01 NOTE — Progress Notes (Signed)
Physical Therapy Discharge Summary & Treatment Note  Patient Details  Name: Jenny [name] MRN: [id number] Date of Birth: 03/05/1941 Today's Date: 09/01/2011  Individual therapy: 1305-1330, denies pain. Treatment session focused on gait with RW at modified independent level and worked on dynamic balance on compliant surface without UE support for balance strategies and functional mobility to play horseshoe game. Pt able to maintain back precautions without cueing when reaching for horseshoes. Household simulated gait to collect objects and put away. Modified independent for bed mobility.  Individual therapy [id number], denies pain. Treatment session focused on community environment gait on uneven surfaces outdoors with RW, overall S secondary to cueing needed for safety intermittently. At this time would recommend pt have distant supervision for community environment mobility and therefore did not meet goal of modified independent. Pt in agreement at supervision level at this time. Also practiced negotiating through gift shop with RW, sidestepping and avoiding obstacles. Required cueing for safety when leg on RW did not clear object on the floor. Discussed energy conservation techniques when out in the community.  Patient has met 8 of 9 long term goals due to improved activity tolerance, improved balance, increased strength, decreased pain and ability to compensate for deficits.  Patient to discharge at an ambulatory level Modified Independent with RW at this time.  Recommendation:  Patient will benefit from ongoing skilled PT services in home health setting to continue to advance safe functional mobility, address ongoing impairments in strength, activity tolerance, balance, strength, and minimize fall risk.  Equipment: Equipment provided: RW  Patient/family agrees with progress made and goals achieved: Yes  Tedd Sias 09/01/2011, 2:44 PM

## 2011-09-01 NOTE — Discharge Summary (Signed)
  Discharge Summary [phone] EAV#[id number]#[id number]

## 2011-09-02 DIAGNOSIS — E669 Obesity, unspecified: Secondary | ICD-10-CM

## 2011-09-02 DIAGNOSIS — Q762 Congenital spondylolisthesis: Secondary | ICD-10-CM

## 2011-09-02 DIAGNOSIS — IMO0002 Reserved for concepts with insufficient information to code with codable children: Secondary | ICD-10-CM

## 2011-09-02 DIAGNOSIS — R339 Retention of urine, unspecified: Secondary | ICD-10-CM

## 2011-09-02 NOTE — Discharge Instructions (Signed)
Inpatient Rehab Discharge Instructions  Jenny [name] Discharge date and time:  09/02/11  Activities/Precautions/ Functional Status: Activity: no lifting, driving, or strenuous exercise for till cleared by Dr. Jeral Fruit Diet: regular diet Wound Care: keep wound clean and dry Functional status:  ___ No restrictions     ___ Walk up steps independently ___ 24/7 supervision/assistance   ___ Walk up steps with assistance _X__ Intermittent supervision/assistance  __X_ Bathe/dress independently __X_ Walk with walker    __ Bathe/dress with assistance ___ Walk Independently    ___ Shower independently ___ Walk with assistance    ___ Shower with assistance _X__ No alcohol     ___ Return to work/school ________  Special Instructions:  COMMUNITY REFERRALS UPON DISCHARGE:    Home Health:   PT  Agency [facility] Phone: [phone] Date of last service: 09/02/2011    Medical Equipment/Items Ordered:  Levan Hurst  Agency/Supplier: [facility]  NO PREFERNECE  IF QUESTIONS OR CONCERNS ARISE PLEASE CONTACTAmada Jupiter, LCSW                                                                                                           [id number]    Routine Back Precautions. Wear brace when at edge of bed or out of bed.  My questions have been answered and I understand these instructions. I will adhere to these goals and the provided educational materials after my discharge from the hospital.  Patient/Caregiver Signature _______________________________ Date __________  Clinician Signature _______________________________________ Date __________  Please bring this form and your medication list with you to all your follow-up doctor's appointments.

## 2011-09-02 NOTE — Progress Notes (Signed)
Patient ID: Jenny [name], female   DOB: 1941/05/03, 70 y.o.   MRN: [id number]        Subjective/Complaints: Leg pain much improved; Plan discharge home today.Anxious for discharge.Patient encouraged with progress. Urinary frequency last night due to lasix Review of Systems  All other systems reviewed and are negative.      Objective: Vital Signs: Blood pressure 121/77, pulse 79, temperature 97.9 F (36.6 C), temperature source Oral, resp. rate 20, weight 106.505 kg (234 lb 12.8 oz), SpO2 95.00%. No results found. No results found for this basename: WBC:2,HGB:2,HCT:2,PLT:2 in the last 72 hours No results found for this basename: NA:2,K:2,CL:2,CO2:2,GLUCOSE:2,BUN:2,CREATININE:2,CALCIUM:2 in the last 72 hours CBG (last 3)  No results found for this basename: GLUCAP:3 in the last 72 hours  Wt Readings from Last 3 Encounters:  08/24/11 106.505 kg (234 lb 12.8 oz)  08/20/11 107.639 kg (237 lb 4.8 oz)  08/20/11 107.639 kg (237 lb 4.8 oz)   BP Readings from Last 3 Encounters:  09/02/11 121/77  08/24/11 141/80  08/24/11 141/80    Physical Exam:  General appearance: alert Head: Normocephalic, without obvious abnormality, atraumatic Eyes: conjunctivae/corneas clear. PERRL, EOM's intact. Fundi benign. Ears: normal TM's and external ear canals both ears Nose: Nares normal. Septum midline. Mucosa normal. No drainage or sinus tenderness. Throat: lips, mucosa, and tongue normal; teeth and gums normal Neck: no adenopathy, no carotid bruit, no JVD, supple, symmetrical, trachea midline and thyroid not enlarged, symmetric, no tenderness/mass/nodules Back: back tender, wearing lso Resp: clear to auscultation bilaterally Cardio: regular rate and rhythm, S1, S2 normal, no murmur, click, rub or gallop GI: soft, non-tender; bowel sounds normal; no masses,  no organomegaly Extremities: extremities normal, atraumatic, no cyanosis or edema Pulses: 2+ and symmetric Skin: Skin color,  texture, turgor normal. No rashes or lesions Neurologic: Grossly normal  Moves all 4's.  No focal weakness but does have proximal pain inhibition weakness. No resting tone Incision/Wound: back incision clean dry and intact   Assessment/Plan: 1. 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 Physiatrist is providing close team supervision and 24 hour management of active medical problems listed below. Physiatrist and rehab team continue to assess barriers to discharge/monitor patient progress toward functional and medical goals.  Discharge Home Today.  Goals met.  HH f/u.  Surgical f/u    Mobility: Bed Mobility Bed Mobility: Yes Supine to Sit: 6: Modified independent (Device/Increase time) Sit to Supine - Right: 2: Max assist Sit to Supine - Left: 6: Modified independent (Device/Increase time) Transfers Transfers: Yes Sit to Stand: 6: Modified independent (Device/Increase time) Sit to Stand Details (indicate cue type and reason): focus on anterior weight shift and UE position to maintain back precautions and increase functional independence. Stand to Sit: 6: Modified independent (Device/Increase time) Stand to Sit Details: focus on increased hip flexion and UE position to maintain back precautions and increase functional independence. Decreased bilat LE eccentric control noted left > right. Stand Pivot Transfers: 6: Modified independent (Device/Increase time) Ambulation/Gait Ambulation/Gait Assistance: 6: Modified independent (Device/Increase time) Ambulation/Gait Assistance Details (indicate cue type and reason): good posture noted, good safety Ambulation Distance (Feet): 200 Feet Assistive device: Rolling walker Gait Pattern: Decreased step length - right;Decreased step length - left;Step-through pattern;Shuffle (wide base of  support with bilateral toe out) Stairs: Yes Stairs Assistance: 6: Modified independent (Device/Increase time) Stairs Assistance Details (indicate cue type and reason):  trialed different techniques for 2 steps without handrails.  pt required VC for rw use and preferred to put her hands on therapist shoulder and back up stairs. Stair Management Technique: One rail Right;One rail Left (ascending on R, descending on L) Number of Stairs: 12  Height of Stairs: 6    ADL:   Cognition: Cognition Overall Cognitive Status: Appears within functional limits for tasks assessed Arousal/Alertness: Awake/alert Orientation Level: Oriented X4 Cognition Arousal/Alertness: Awake/alert Orientation Level: Oriented X4  2. DVT Prophylaxis/Anticoagulation:Ambulatory/Discontinue lovenox  3. Pain Management: Prn oxycodone. robaxin prn for spasms.   Pm Oxy CR helpful.    3. Mood: Motivated to get better. Monitor for now.patient with good spirits.  Patient Active Hospital Problem List:  4. Asthma/Bronchitis: Resume symbicort.Denies increased shortness of breath. 5. Urinary retention: emptying bladder now with foley out.  No problems currently.Denies dysuria 6. Hypothyroid: Continue supplemental synthyroid.  Out pt f/u 7.GERD: On scheduled pepcid. Increased GI symtoms currently due to decreased moblility. Needs to be upright for meals.Doing much better now that mobility improved. 8. Hypertension: Change lasix to prn only as this is how she takes it at home. BID checks . Normotensive currently.Follow up with PCP 9. Hyperkalemia: Off supplement currently. Potassium normal 10. Constipation: bowel regimen effective.       SWARTZ,ZACHARY T 09/02/2011, 7:02 AM

## 2011-09-02 NOTE — Progress Notes (Signed)
Therapeutic Recreation Discharge Summary Patient Details  Name: Jenny [name] MRN: [id number] Date of Birth: [birth year]  Long term goals set: 1  Long term goals met: 1  Comments on progress toward goals: Pt has made great progress toward goal and is being discharged at Mod I level.  Pt is able to identify energy conservation techniques and safety concerns during leisure/community activities.  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  Pt requires extra time to complete tasks.  Pt is being d/c'd home today with daughter.  Reasons goals not met: n/a  Reasons for discharge: discharge from hospital  Patient/family agrees with progress made and goals achieved: Yes  Jood Retana 09/02/2011, 8:18 AM

## 2011-09-02 NOTE — Progress Notes (Signed)
Patient ID: Jenny [name], female   DOB: [birth year], 70 y.o.   MRN: [id number]        Subjective/Complaints: Leg pain much improved; Plan discharge home today.Anxious for discharge.Patient encouraged with progress. Urinary frequency last night due to lasix Review of Systems  All other systems reviewed and are negative.      Objective: Vital Signs: Blood pressure 141/71, pulse 87, temperature 97.7 F (36.5 C), temperature source Oral, resp. rate 20, weight 106.505 kg (234 lb 12.8 oz), SpO2 96.00%. No results found. No results found for this basename: WBC:2,HGB:2,HCT:2,PLT:2 in the last 72 hours No results found for this basename: NA:2,K:2,CL:2,CO2:2,GLUCOSE:2,BUN:2,CREATININE:2,CALCIUM:2 in the last 72 hours CBG (last 3)  No results found for this basename: GLUCAP:3 in the last 72 hours  Wt Readings from Last 3 Encounters:  08/24/11 106.505 kg (234 lb 12.8 oz)  08/20/11 107.639 kg (237 lb 4.8 oz)  08/20/11 107.639 kg (237 lb 4.8 oz)   BP Readings from Last 3 Encounters:  09/01/11 141/71  08/24/11 141/80  08/24/11 141/80    Physical Exam:  General appearance: alert Head: Normocephalic, without obvious abnormality, atraumatic Eyes: conjunctivae/corneas clear. PERRL, EOM's intact. Fundi benign. Ears: normal TM's and external ear canals both ears Nose: Nares normal. Septum midline. Mucosa normal. No drainage or sinus tenderness. Throat: lips, mucosa, and tongue normal; teeth and gums normal Neck: no adenopathy, no carotid bruit, no JVD, supple, symmetrical, trachea midline and thyroid not enlarged, symmetric, no tenderness/mass/nodules Back: back tender, wearing lso Resp: clear to auscultation bilaterally Cardio: regular rate and rhythm, S1, S2 normal, no murmur, click, rub or gallop GI: soft, non-tender; bowel sounds normal; no masses,  no organomegaly Extremities: extremities normal, atraumatic, no cyanosis or edema Pulses: 2+ and symmetric Skin: Skin color,  texture, turgor normal. No rashes or lesions Neurologic: Grossly normal  Moves all 4's.  No focal weakness but does have proximal pain inhibition weakness. No resting tone Incision/Wound: back incision clean dry and intact   Assessment/Plan: 1. 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 Physiatrist is providing close team supervision and 24 hour management of active medical problems listed below. Physiatrist and rehab team continue to assess barriers to discharge/monitor patient progress toward functional and medical goals.  Team conference today.  Probably ready to go home soon.    Mobility: Bed Mobility Bed Mobility: Yes Supine to Sit: 6: Modified independent (Device/Increase time) Sit to Supine - Right: 2: Max assist Sit to Supine - Left: 6: Modified independent (Device/Increase time) Transfers Transfers: Yes Sit to Stand: 6: Modified independent (Device/Increase time) Sit to Stand Details (indicate cue type and reason): focus on anterior weight shift and UE position to maintain back precautions and increase functional independence. Stand to Sit: 6: Modified independent (Device/Increase time) Stand to Sit Details: focus on increased hip flexion and UE position to maintain back precautions and increase functional independence. Decreased bilat LE eccentric control noted left > right. Stand Pivot Transfers: 6: Modified independent (Device/Increase time) Ambulation/Gait Ambulation/Gait Assistance: 6: Modified independent (Device/Increase time) Ambulation/Gait Assistance Details (indicate cue type and reason): good posture noted, good safety Ambulation Distance (Feet): 200 Feet Assistive device: Rolling walker Gait Pattern: Decreased step length - right;Decreased step length - left;Step-through pattern;Shuffle (wide base of  support with bilateral toe out) Stairs: Yes Stairs Assistance: 6: Modified independent (Device/Increase time) Stairs Assistance Details (indicate cue type and reason): trialed different  techniques for 2 steps without handrails.  pt required VC for rw use and preferred to put her hands on therapist shoulder and back up stairs. Stair Management Technique: One rail Right;One rail Left (ascending on R, descending on L) Number of Stairs: 12  Height of Stairs: 6    ADL:   Cognition: Cognition Overall Cognitive Status: Appears within functional limits for tasks assessed Arousal/Alertness: Awake/alert Orientation Level: Oriented X4 Cognition Arousal/Alertness: Awake/alert Orientation Level: Oriented X4  2. DVT Prophylaxis/Anticoagulation:Ambulatory/Discontinue lovenox  3. Pain Management: Prn oxycodone. robaxin prn for spasms.   Pm Oxy CR helpful.    3. Mood: Motivated to get better. Monitor for now.patient with good spirits.  Patient Active Hospital Problem List:  4. Asthma/Bronchitis: Resume symbicort.Denies increased shortness of breath. 5. Urinary retention: emptying bladder now with foley out.  No problems currently.Denies dysuria 6. Hypothyroid: Continue supplemental synthyroid.  Out pt f/u 7.GERD: On scheduled pepcid. Increased GI symtoms currently due to decreased moblility. Needs to be upright for meals.Doing much better now that mobility improved. 8. Hypertension: Change lasix to prn only as this is how she takes it at home. BID checks . Normotensive currently.Follow up with PCP 9. Hyperkalemia: Off supplement currently. Potassium normal 10. Constipation: bowel regimen effective.       [name]. 09/02/2011, 6:19 AM

## 2011-09-02 NOTE — Progress Notes (Signed)
Pt discharged about 1000 with daughter and all discharge instructions given via Harvel Ricks PA. Pt denied any questions about the discharge. Patient has had all belongings packed. Patient modified independent in room with rolling walker. Patient reported no need for pain medication this morning. Patient continent of bowel and bladder with last bowel movement on 11/20. Patient back incision dressing changed with new dry dressing. Patient instructed to continue wearing back brace OOB. Patient alert and oriented x 3 and uses call bell appropriately. Patient daughter educated on sign and symptoms of infection when assessing mother's incision. Pt medically stable.

## 2011-09-18 ENCOUNTER — Ambulatory Visit (INDEPENDENT_AMBULATORY_CARE_PROVIDER_SITE_OTHER): Payer: Medicare Other | Admitting: Family Medicine

## 2011-09-18 VITALS — BP 146/89 | HR 100

## 2011-09-18 DIAGNOSIS — I10 Essential (primary) hypertension: Secondary | ICD-10-CM

## 2011-09-21 ENCOUNTER — Encounter: Payer: Self-pay | Admitting: Family Medicine

## 2011-09-21 ENCOUNTER — Ambulatory Visit (INDEPENDENT_AMBULATORY_CARE_PROVIDER_SITE_OTHER): Payer: Medicare Other | Admitting: Family Medicine

## 2011-09-21 ENCOUNTER — Other Ambulatory Visit: Payer: Self-pay | Admitting: Family Medicine

## 2011-09-21 VITALS — BP 138/90 | HR 81 | Temp 98.0°F | Ht 65.0 in | Wt 225.1 lb

## 2011-09-21 DIAGNOSIS — G629 Polyneuropathy, unspecified: Secondary | ICD-10-CM

## 2011-09-21 DIAGNOSIS — R319 Hematuria, unspecified: Secondary | ICD-10-CM

## 2011-09-21 DIAGNOSIS — R109 Unspecified abdominal pain: Secondary | ICD-10-CM

## 2011-09-21 DIAGNOSIS — Z23 Encounter for immunization: Secondary | ICD-10-CM

## 2011-09-21 DIAGNOSIS — G609 Hereditary and idiopathic neuropathy, unspecified: Secondary | ICD-10-CM

## 2011-09-21 DIAGNOSIS — R6883 Chills (without fever): Secondary | ICD-10-CM

## 2011-09-21 DIAGNOSIS — I10 Essential (primary) hypertension: Secondary | ICD-10-CM

## 2011-09-21 DIAGNOSIS — D649 Anemia, unspecified: Secondary | ICD-10-CM

## 2011-09-21 DIAGNOSIS — R209 Unspecified disturbances of skin sensation: Secondary | ICD-10-CM

## 2011-09-21 DIAGNOSIS — R202 Paresthesia of skin: Secondary | ICD-10-CM

## 2011-09-21 DIAGNOSIS — M545 Low back pain, unspecified: Secondary | ICD-10-CM

## 2011-09-21 DIAGNOSIS — E039 Hypothyroidism, unspecified: Secondary | ICD-10-CM

## 2011-09-21 HISTORY — DX: Anemia, unspecified: D64.9

## 2011-09-21 LAB — POCT URINALYSIS DIPSTICK
Bilirubin, UA: NEGATIVE
Glucose, UA: NEGATIVE
Ketones, UA: NEGATIVE
Nitrite, UA: NEGATIVE
Protein, UA: NEGATIVE
Spec Grav, UA: 1.02
Urobilinogen, UA: 1
pH, UA: 7

## 2011-09-21 NOTE — Assessment & Plan Note (Signed)
Check a vitamin b 12, folic acid and thiamine levels. If symptoms persist will need further work up if no cause found

## 2011-09-21 NOTE — Progress Notes (Signed)
Jenny [name] [id number] 1941-09-03 09/21/2011      Progress Note-Follow Up  Subjective  Chief Complaint  Chief Complaint  Patient presents with  . Hypertension    HPI  Patient is a 70 year old Caucasian female who is in today for evaluation of elevated blood pressure and fatigue. Since we last saw her she has had 2 surgeries one on her neck and one on her low back with Dr. Allena Katz. She was in a lot of pain with significant radiculopathy and is now in greatly improved shape. No pain to speak of. Did note some nausea and poor appetite after surgery but that is slowly recovering. Her major complaint today is of recent elevated blood pressures in the 1:30 to 160 range systolic and 80s and 90s and occasionally over 100 diastolic. She also is complaining of cold feet bilaterally and this is been persistent since her last surgery. She was released from the hospital the day before Thanksgiving has had cold feet bilaterally ever since. He acknowledges not taking her Synthroid faithfully. No increased numbness or tingling but does have persistent sensation of prickly and and discomfort in her feet which was present prior to the surgery. No chest pain, palpitations, fevers, chills, GI or GU complaints noted today  Past Medical History  Diagnosis Date  . Allergy     rhinitis  . Bronchitis   . Venous insufficiency   . Obesity   . Hypothyroidism   . GERD (gastroesophageal reflux disease)   . Diverticulosis of colon   . Urinary incontinence   . Degenerative joint disease   . Lumbar back pain   . Vitamin D deficiency   . Dysthymia   . Asthma     mild, intermittent  . Transient memory loss March 2006    after a stressful event she had no memory the rest of that day  . VITAMIN D DEFICIENCY 08/21/2008  . VENOUS INSUFFICIENCY 08/23/2007  . Other urinary incontinence 03/12/2010  . OBESITY 08/23/2007  . HYPOTHYROIDISM 08/23/2007  . HAND PAIN 04/09/2010  . GERD 08/23/2007  . 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    . DIVERTICULOSIS OF COLON 02/22/2008  . DIABETES MELLITUS, BORDERLINE 08/21/2008  . DEGENERATIVE JOINT DISEASE 02/22/2008  . BRONCHITIS, RECURRENT 02/22/2008  . BACK PAIN, LUMBAR 08/23/2007  . ALLERGIC RHINITIS 08/23/2007  . Asthma 01/14/2011  . 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 History of chicken pox 01/14/2011  . History of measles 01/14/2011  . PONV (postoperative nausea and vomiting)   . Peripheral neuropathy 02/12/2011    possibly related to spondylolisthesis per pt  . Urinary frequency   . Cough     usually not productive  . Incontinence of urine   . Obstructive sleep apnea     does not use CPAP, lost weight  . SLEEP APNEA, OBSTRUCTIVE 08/23/2007  . Hypertension   . HYPERTENSION 02/22/2008  . Anemia 09/21/2011    Past Surgical History  Procedure Date  . Appendectomy   . Tonsillectomy and adenoidectomy   . Ap repair and sling for cystocele 8-04     Dr Patsi Sears  . Left hand 2010    left thumb, joint removal, [facility]  . Ctr     bilateral  . Shoulder arthroscopy distal clavicle excision and open rotator cuff repair     right  . Neck surgery     x 2 - discectomy, fusion  . Abdominal hysterectomy     took one ovary  . Cholecystectomy   . Carpal tunel 2008  Family History  Problem Relation Age of Onset  . COPD Mother   . Pneumonia Mother   . Other Mother     CHF  . Hypertension Mother   . Arthritis Mother     septic knee after replacement  . Heart disease Mother     chf  . Dementia Father   . Arthritis Brother   . Asthma Daughter   . Cancer Maternal Aunt     breast  . Stroke Maternal Grandmother   . Heart disease Maternal Grandmother   . Other Maternal Grandfather     CHF  . Heart disease Maternal Grandfather     chf  . Cancer Paternal Grandmother     stomach?  . Other Paternal Grandfather     problems with kidneys  . Kidney disease Paternal Grandfather   . Anxiety disorder Daughter   . Cancer Maternal Aunt     liver    History   Social History   . Marital Status: Widowed    Spouse Name: N/A    Number of Children: N/A  . Years of Education: N/A   Occupational History  . Not on file.   Social History Main Topics  . Smoking status: Former Smoker -- 1.5 packs/day for 30 years    Types: Cigarettes    Quit date: 10/13/1991  . Smokeless tobacco: Never Used  . Alcohol Use: No  . Drug Use: No  . Sexually Active: No   Other Topics Concern  . Not on file   Social History Narrative  . No narrative on file    Current Outpatient Prescriptions on File Prior to Visit  Medication Sig Dispense Refill  . albuterol (PROVENTIL HFA;VENTOLIN HFA) 108 (90 BASE) MCG/ACT inhaler Inhale 2 puffs into the lungs every 6 (six) hours as needed. For wheezing       . ALPRAZolam (XANAX) 0.5 MG tablet Take 0.5 mg by mouth 3 (three) times daily as needed. For anxiety      . aspirin EC 81 MG tablet Take 81 mg by mouth daily.        . budesonide-formoterol (SYMBICORT) 160-4.5 MCG/ACT inhaler Inhale 1 puff into the lungs 2 (two) times daily.        . calcium carbonate (OS-CAL) 600 MG TABS Take 600 mg by mouth 2 (two) times daily with a meal.       . Cholecalciferol (VITAMIN D) 1000 UNITS capsule Take 1,000 Units by mouth daily.       . clotrimazole-betamethasone (LOTRISONE) cream Apply 1 application topically 2 (two) times daily as needed. Rash, itching       . famotidine (PEPCID) 10 MG tablet Take 10 mg by mouth 2 (two) times daily as needed. For indigestion      . furosemide (LASIX) 40 MG tablet Take 40 mg by mouth daily as needed. For swelling        . levothyroxine (SYNTHROID, LEVOTHROID) 125 MCG tablet Take 125 mcg by mouth daily.        Marland Kitchen nystatin (MYCOSTATIN) ointment Apply 1 application topically 2 (two) times daily as needed. For rash      . benzonatate (TESSALON PERLES) 100 MG capsule Take 1 capsule (100 mg total) by mouth 3 (three) times daily as needed for cough.  30 capsule  0  . guaiFENesin (MUCINEX) 600 MG 12 hr tablet Take 1,200 mg by mouth  2 (two) times daily.          Allergies  Allergen Reactions  .  Codeine     REACTION: nausea  . Prednisone     "I forget who I am"  Medrol is ok    Review of Systems  Review of Systems  Constitutional: Positive for malaise/fatigue. Negative for fever.  HENT: Negative for congestion and neck pain.   Eyes: Negative for discharge.  Respiratory: Negative for shortness of breath.   Cardiovascular: Negative for chest pain, palpitations and leg swelling.  Gastrointestinal: Negative for nausea, abdominal pain and diarrhea.  Genitourinary: Negative for dysuria.  Musculoskeletal: Negative for back pain and falls.  Skin: Negative for rash.  Neurological: Negative for loss of consciousness and headaches.       Cold feet b/l   Endo/Heme/Allergies: Negative for polydipsia.  Psychiatric/Behavioral: Negative for depression and suicidal ideas. The patient is not nervous/anxious and does not have insomnia.     Objective  BP 138/90  Pulse 81  Temp(Src) 98 F (36.7 C) (Oral)  Ht 5\' 5"  (1.651 m)  Wt 225 lb 1.9 oz (102.114 kg)  BMI 37.46 kg/m2  SpO2 97%  Physical Exam  Physical Exam  Constitutional: She is oriented to person, place, and time and well-developed, well-nourished, and in no distress. No distress.  HENT:  Head: Normocephalic and atraumatic.  Eyes: Conjunctivae are normal.  Neck: Neck supple. No thyromegaly present.  Cardiovascular: Normal rate, regular rhythm and normal heart sounds.   No murmur heard.      1/6 posterior tibial pulses b/l. Skin is thin and dry but not violaceous or cold  Pulmonary/Chest: Effort normal and breath sounds normal. She has no wheezes.  Abdominal: She exhibits no distension and no mass.  Musculoskeletal: She exhibits no edema.       In a low back brace s/p surgery  Lymphadenopathy:    She has no cervical adenopathy.  Neurological: She is alert and oriented to person, place, and time.  Skin: Skin is warm and dry. No rash noted. She is not  diaphoretic.  Psychiatric: Memory, affect and judgment normal.    Lab Results  Component Value Date   TSH 0.07* 01/14/2011   Lab Results  Component Value Date   WBC 8.7 08/25/2011   HGB 10.3* 08/25/2011   HCT 32.6* 08/25/2011   MCV 92.1 08/25/2011   PLT 322 08/25/2011   Lab Results  Component Value Date   CREATININE 0.78 08/25/2011   BUN 13 08/25/2011   NA 137 08/25/2011   K 4.1 08/25/2011   CL 98 08/25/2011   CO2 31 08/25/2011   Lab Results  Component Value Date   ALT 18 08/25/2011   AST 27 08/25/2011   ALKPHOS 86 08/25/2011   BILITOT 0.5 08/25/2011   Lab Results  Component Value Date   CHOL 132 01/14/2011   Lab Results  Component Value Date   HDL 39.70 01/14/2011   Lab Results  Component Value Date   LDLCALC 76 01/14/2011   Lab Results  Component Value Date   TRIG 84.0 01/14/2011   Lab Results  Component Value Date   CHOLHDL 3 01/14/2011     Assessment & Plan  Peripheral neuropathy Check a vitamin b 12, folic acid and thiamine levels. If symptoms persist will need further work up if no cause found  HYPOTHYROIDISM Has been missing doses intermittently since last seen could be worsening neuropathy, may need increase dosing and encouraged to take daily dosing  BACK PAIN, LUMBAR Has actually had 2 surgeries since last seen, both by Dr Jeral Fruit. Both with good results, her radicular  symptoms and back pain resolved. Since her last surgery she has had a persistent sense of her feet feeling cold. Will check labs to further evaluate  HYPERTENSION Mild elevation today, no changes at present but we will recheck at next visit in 3 weeks, minimize sodium

## 2011-09-21 NOTE — Progress Notes (Signed)
Nurse visit for bp check

## 2011-09-21 NOTE — Assessment & Plan Note (Signed)
Has been missing doses intermittently since last seen could be worsening neuropathy, may need increase dosing and encouraged to take daily dosing

## 2011-09-21 NOTE — Assessment & Plan Note (Signed)
Mild elevation today, no changes at present but we will recheck at next visit in 3 weeks, minimize sodium

## 2011-09-21 NOTE — Assessment & Plan Note (Signed)
Has actually had 2 surgeries since last seen, both by Dr Jeral Fruit. Both with good results, her radicular symptoms and back pain resolved. Since her last surgery she has had a persistent sense of her feet feeling cold. Will check labs to further evaluate

## 2011-09-21 NOTE — Patient Instructions (Signed)
Peripheral Neuropathy Peripheral neuropathy is a common disorder of your nerves resulting from damage. CAUSES  This disorder may be caused by a disease of the nerves or illness. Many neuropathies have well known causes such as:  Diabetes. This is one of the most common causes.   Uremia.   AIDS.   Nutritional deficiencies.   Other causes include mechanical pressures. These may be from:   Compression.   Injury.   Contusions or bruises.   Fracture or dislocated bones.   Pressure involving the nerves close to the surface. Nerves such as the ulnar, or radial can be injured by prolonged use of crutches.  Other injuries may come from:  Tumor.   Hemorrhage or bleeding into a nerve.   Exposure to cold or radiation.   Certain medicines or toxic substances (rare).   Vascular or collagen disorders such as:   Atherosclerosis.   Systemic lupus erythematosus.   Scleroderma.   Sarcoidosis.   Rheumatoid arthritis.   Polyarteritis nodosa.   A large number of cases are of unknown cause.  SYMPTOMS  Common problems include:  Weakness.   Numbness.   Abnormal sensations (paresthesia) such as:   Burning.   Tickling.   Pricking.   Tingling.   Pain in the arms, hands, legs and/or feet.  TREATMENT  Therapy for this disorder differs depending on the cause. It may vary from medical treatment with medications or physical therapy among others.   For example, therapy for this disorder caused by diabetes involves control of the diabetes.   In cases where a tumor or ruptured disc is the cause, therapy may involve surgery. This would be to remove the tumor or to repair the ruptured disc.   In entrapment or compression neuropathy, treatment may consist of splinting or surgical decompression of the ulnar or median nerves. A common example of entrapment neuropathy is carpal tunnel syndrome. This has become more common because of the increasing use of computers.   Peroneal and  radial compression neuropathies may require avoidance of pressure.   Physical therapy and/or splints may be useful in preventing contractures. This is a condition in which shortened muscles around joints cause abnormal and sometimes painful positioning of the joints.  Document Released: 09/18/2002 Document Revised: 06/10/2011 Document Reviewed: 09/28/2005 [employer] Patient Information 2012 [employer]. 

## 2011-09-22 ENCOUNTER — Telehealth: Payer: Self-pay | Admitting: Family Medicine

## 2011-09-22 LAB — RENAL FUNCTION PANEL
Albumin: 4 g/dL (ref 3.5–5.2)
BUN: 17 mg/dL (ref 6–23)
CO2: 26 mEq/L (ref 19–32)
Calcium: 9.1 mg/dL (ref 8.4–10.5)
Chloride: 104 meq/L (ref 96–112)
Creat: 0.94 mg/dL (ref 0.50–1.10)
Glucose, Bld: 89 mg/dL (ref 70–99)
Phosphorus: 2.9 mg/dL (ref 2.3–4.6)
Potassium: 4.7 meq/L (ref 3.5–5.3)
Sodium: 140 meq/L (ref 135–145)

## 2011-09-22 LAB — SEDIMENTATION RATE: Sed Rate: 7 mm/hr (ref 0–22)

## 2011-09-22 LAB — CBC
HCT: 41.5 % (ref 36.0–46.0)
Hemoglobin: 12.7 g/dL (ref 12.0–15.0)
MCH: 28.7 pg (ref 26.0–34.0)
MCHC: 30.6 g/dL (ref 30.0–36.0)
MCV: 93.7 fL (ref 78.0–100.0)
Platelets: 245 10*3/uL (ref 150–400)
RBC: 4.43 MIL/uL (ref 3.87–5.11)
RDW: 14.1 % (ref 11.5–15.5)
WBC: 9.7 10*3/uL (ref 4.0–10.5)

## 2011-09-22 LAB — VITAMIN B12: Vitamin B-12: 220 pg/mL (ref 211–911)

## 2011-09-22 LAB — FOLATE: Folate: >20 ng/mL

## 2011-09-22 LAB — TSH: TSH: 24.72 u[IU]/mL — ABNORMAL HIGH (ref 0.350–4.500)

## 2011-09-22 NOTE — Telephone Encounter (Signed)
Pt informed that we should be having the results back tomorrow

## 2011-09-22 NOTE — Telephone Encounter (Signed)
Pls contact patient to let her know her thyroid results, she is out of her medication

## 2011-09-23 ENCOUNTER — Other Ambulatory Visit: Payer: Self-pay

## 2011-09-23 MED ORDER — LEVOTHYROXINE SODIUM 125 MCG PO TABS: ORAL_TABLET | ORAL | Status: DC

## 2011-09-23 NOTE — Progress Notes (Signed)
Addended by: Court Joy on: 09/23/2011 11:51 AM   Modules accepted: Orders

## 2011-09-24 LAB — VITAMIN B1: Vitamin B1 (Thiamine): 10 nmol/L (ref 8–30)

## 2011-09-25 ENCOUNTER — Telehealth: Payer: Self-pay | Admitting: *Deleted

## 2011-09-25 LAB — URINE CULTURE: Colony Count: >=100000

## 2011-09-25 MED ORDER — SULFAMETHOXAZOLE-TRIMETHOPRIM 800-160 MG PO TABS: 1.0000 | ORAL_TABLET | Freq: Two times a day (BID) | ORAL | Status: AC

## 2011-09-25 NOTE — Progress Notes (Signed)
Addended by: Danise Edge A on: 09/25/2011 08:24 AM   Modules accepted: Orders

## 2011-09-25 NOTE — Telephone Encounter (Signed)
Message left on cell voicemail per DPR that labs show UTI and antibiotics have been sent to pharmacy.

## 2011-09-25 NOTE — Progress Notes (Signed)
Patient ID: Jenny [name], female   DOB: [birth year], 70 y.o.   MRN: [id number] Patient urine culture positive for EColi sensitive to TMP/SMX will rx bid dosing x 7 days

## 2011-10-08 DIAGNOSIS — Z0271 Encounter for disability determination: Secondary | ICD-10-CM

## 2011-10-15 ENCOUNTER — Ambulatory Visit (INDEPENDENT_AMBULATORY_CARE_PROVIDER_SITE_OTHER): Payer: Medicare Other | Admitting: Family Medicine

## 2011-10-15 ENCOUNTER — Encounter: Payer: Self-pay | Admitting: Family Medicine

## 2011-10-15 DIAGNOSIS — L509 Urticaria, unspecified: Secondary | ICD-10-CM | POA: Insufficient documentation

## 2011-10-15 NOTE — Progress Notes (Signed)
OFFICE NOTE  10/17/2011  CC:  Chief Complaint  Patient presents with  . Rash    arms, chest, legs ? hives     HPI:   Patient is a 71 y.o. Caucasian female who is here for rash. Onset of itchy little hives 6d/a, has taken benadryl daily and started medrol last night (8mg ) and this morning (leftover from prev episode of urticaria). NO known trigger.  No tongue swelling, no throat swelling/closing, no wheezing.  No new meds recently. Denies joint swelling.  No blood in urine.   No fever, no HA, no cough.   Pertinent PMH:  Hx of anaphylaxis from food Asthma HTN Hypothyroidism Obesity OSA GERD Borderline DM 2  MEDS;   Outpatient Prescriptions Prior to Visit  Medication Sig Dispense Refill  . albuterol (PROVENTIL HFA;VENTOLIN HFA) 108 (90 BASE) MCG/ACT inhaler Inhale 2 puffs into the lungs every 6 (six) hours as needed. For wheezing       . ALPRAZolam (XANAX) 0.5 MG tablet Take 0.5 mg by mouth 3 (three) times daily as needed. For anxiety      . aspirin EC 81 MG tablet Take 81 mg by mouth daily.        . benzonatate (TESSALON PERLES) 100 MG capsule Take 1 capsule (100 mg total) by mouth 3 (three) times daily as needed for cough.  30 capsule  0  . budesonide-formoterol (SYMBICORT) 160-4.5 MCG/ACT inhaler Inhale 1 puff into the lungs 2 (two) times daily.        . calcium carbonate (OS-CAL) 600 MG TABS Take 600 mg by mouth 2 (two) times daily with a meal.       . Cholecalciferol (VITAMIN D) 1000 UNITS capsule Take 1,000 Units by mouth daily.       . clotrimazole-betamethasone (LOTRISONE) cream Apply 1 application topically 2 (two) times daily as needed. Rash, itching       . furosemide (LASIX) 40 MG tablet Take 40 mg by mouth daily as needed. For swelling        . guaiFENesin (MUCINEX) 600 MG 12 hr tablet Take 1,200 mg by mouth 2 (two) times daily as needed.       Marland Kitchen levothyroxine (SYNTHROID, LEVOTHROID) 125 MCG tablet Take 1 daily and 2 on Saturday  102 tablet  1  . cyclobenzaprine  (FLEXERIL) 10 MG tablet       . famotidine (PEPCID) 10 MG tablet Take 10 mg by mouth 2 (two) times daily as needed. For indigestion      . HYDROcodone-acetaminophen (NORCO) 10-325 MG per tablet       . nystatin (MYCOSTATIN) ointment Apply 1 application topically 2 (two) times daily as needed. For rash      . OXYCONTIN 20 MG 12 hr tablet         PE: Blood pressure 147/83, pulse 81, temperature 98.4 F (36.9 C), temperature source Temporal, height 5\' 5"  (1.651 m), weight 225 lb (102.059 kg). Gen: Alert, well appearing.  Patient is oriented to person, place, time, and situation. ENT: Ears: EACs clear, normal epithelium.  TMs with good light reflex and landmarks bilaterally.  Eyes: no injection, icteris, swelling, or exudate.  EOMI, PERRLA. Nose: no drainage or turbinate edema/swelling.  No injection or focal lesion.  Mouth: lips without lesion/swelling.  Oral mucosa pink and moist.  Dentition intact and without obvious caries or gingival swelling.  Oropharynx without erythema, exudate, or swelling.  Neck - No masses or thyromegaly or limitation in range of motion CV: RRR LUNGS:  CTA bilat, nonlabored resps, good aeration. SKIN: scattered small blanching macules, some are barely palpable, most are 2-4 mm in size--these are scattered sparsely across front and back or trunk and on extremities.  No target lesions, no petechiae or ecchymoses.  No joint swelling or erythema.   LABS: none  IMPRESSION AND PLAN:  Urticaria Idiopathic. Medrol 8mg  qd x 5d, then 4mg  qd x 5d, then 2 mg qd x 6d, then stop. Continue benadryl 25mg  q6h prn. Discussed monitoring for worsening sx's as well as keeping an eye out for potential trigger/allergen.     FOLLOW UP:  Return if symptoms worsen or fail to improve.

## 2011-10-15 NOTE — Assessment & Plan Note (Addendum)
Idiopathic. Medrol 8mg  qd x 5d, then 4mg  qd x 5d, then 2 mg qd x 6d, then stop. Continue benadryl 25mg  q6h prn. Discussed monitoring for worsening sx's as well as keeping an eye out for potential trigger/allergen.

## 2011-10-16 ENCOUNTER — Telehealth: Payer: Self-pay | Admitting: Family Medicine

## 2011-10-16 NOTE — Telephone Encounter (Signed)
Noted.  Agree--PM

## 2011-10-16 NOTE — Telephone Encounter (Signed)
Spoke with patient.  She states she has more hives and they are larger.  She has not had any swelling of joints or throat.  Advised pt to continue plan as discussed in office, [place] OK and to give it time.  She is agreeable.  She states she has had hives before and she knows if she gets "a tickle" in her throat she needs to "take off to [place]".

## 2011-10-20 ENCOUNTER — Encounter: Payer: Self-pay | Admitting: Family Medicine

## 2011-10-20 ENCOUNTER — Ambulatory Visit (INDEPENDENT_AMBULATORY_CARE_PROVIDER_SITE_OTHER): Payer: Medicare Other | Admitting: Family Medicine

## 2011-10-20 DIAGNOSIS — E119 Type 2 diabetes mellitus without complications: Secondary | ICD-10-CM

## 2011-10-20 DIAGNOSIS — G629 Polyneuropathy, unspecified: Secondary | ICD-10-CM

## 2011-10-20 DIAGNOSIS — T7800XA Anaphylactic reaction due to unspecified food, initial encounter: Secondary | ICD-10-CM

## 2011-10-20 DIAGNOSIS — L509 Urticaria, unspecified: Secondary | ICD-10-CM

## 2011-10-20 DIAGNOSIS — I10 Essential (primary) hypertension: Secondary | ICD-10-CM

## 2011-10-20 DIAGNOSIS — M545 Low back pain, unspecified: Secondary | ICD-10-CM

## 2011-10-20 DIAGNOSIS — R7309 Other abnormal glucose: Secondary | ICD-10-CM

## 2011-10-20 DIAGNOSIS — G609 Hereditary and idiopathic neuropathy, unspecified: Secondary | ICD-10-CM

## 2011-10-20 DIAGNOSIS — D649 Anemia, unspecified: Secondary | ICD-10-CM

## 2011-10-20 DIAGNOSIS — E039 Hypothyroidism, unspecified: Secondary | ICD-10-CM

## 2011-10-20 LAB — RENAL FUNCTION PANEL
Albumin: 4 g/dL (ref 3.5–5.2)
BUN: 20 mg/dL (ref 6–23)
CO2: 25 mEq/L (ref 19–32)
Calcium: 9 mg/dL (ref 8.4–10.5)
Chloride: 105 meq/L (ref 96–112)
Creatinine, Ser: 0.9 mg/dL (ref 0.4–1.2)
GFR: 62.82 mL/min (ref >60.00)
Glucose, Bld: 92 mg/dL (ref 70–99)
Phosphorus: 3.6 mg/dL (ref 2.3–4.6)
Potassium: 4.2 meq/L (ref 3.5–5.1)
Sodium: 141 meq/L (ref 135–145)

## 2011-10-20 LAB — CBC
HCT: 41 % (ref 36.0–46.0)
Hemoglobin: 13.4 g/dL (ref 12.0–15.0)
MCHC: 32.5 g/dL (ref 30.0–36.0)
MCV: 90.8 fL (ref 78.0–100.0)
Platelets: 254 10*3/uL (ref 150.0–400.0)
RBC: 4.52 Mil/uL (ref 3.87–5.11)
RDW: 13.8 % (ref 11.5–14.6)
WBC: 11.2 10*3/uL — ABNORMAL HIGH (ref 4.5–10.5)

## 2011-10-20 LAB — HEMOGLOBIN A1C: Hgb A1c MFr Bld: 5.8 % (ref 4.6–6.5)

## 2011-10-20 LAB — TSH: TSH: 0.55 u[IU]/mL (ref 0.35–5.50)

## 2011-10-20 MED ORDER — HYDROXYZINE HCL 25 MG PO TABS: 25.0000 mg | ORAL_TABLET | Freq: Three times a day (TID) | ORAL | Status: AC | PRN

## 2011-10-20 MED ORDER — METHYLPREDNISOLONE ACETATE PF 40 MG/ML IJ SUSP
40.0000 mg | Freq: Once | INTRAMUSCULAR | Status: AC
  Administered 2011-10-20: 40 mg via INTRAMUSCULAR

## 2011-10-20 MED ORDER — LISINOPRIL 5 MG PO TABS: 5.0000 mg | ORAL_TABLET | Freq: Every day | ORAL | Status: DC

## 2011-10-20 NOTE — Assessment & Plan Note (Signed)
Symptoms improved since last visit

## 2011-10-20 NOTE — Patient Instructions (Addendum)
Hives Hives (urticaria) are itchy, red, swollen patches on the skin. They may change size, shape, and location quickly and repeatedly. Hives that occur deeper in the skin can cause swelling of the hands, feet, and face. Hives may be an allergic reaction to something you or your child ate, touched, or put on the skin. Hives can also be a reaction to cold, heat, viral infections, medication, insect bites, or emotional stress. Often the cause is hard to find. Hives can come and go for several days to several weeks. Hives are not contagious. HOME CARE INSTRUCTIONS   If the cause of the hives is known, avoid exposure to that source.   To relieve itching and rash:   Apply cold compresses to the skin or take cool water baths. Do not take or give your child hot baths or showers because the warmth will make the itching worse.   The best medicine for hives is an antihistamine. An antihistamine will not cure hives, but it will reduce their severity. You can use an antihistamine available over the counter. This medicine may make your child sleepy. Teenagers should not drive while using this medicine.   Take or give an antihistamine every 6 hours until the hives are completely gone for 24 hours or as directed.   Your child may have other medications prescribed for itching. Give these as directed by your child's caregiver.   You or your child should wear loose fitting clothing, including undergarments. Skin irritations may make hives worse.   Follow-up as directed by your caregiver.  SEEK MEDICAL CARE IF:   You or your child still have considerable itching after taking the medication (prescribed or purchased over the counter).   Joint swelling or pain occurs.  SEEK IMMEDIATE MEDICAL CARE IF:   You have a fever.   Swollen lips or tongue are noticed.   There is difficulty with breathing, swallowing, or tightness in the throat or chest.   Abdominal pain develops.   Your child starts acting very  sick.  These may be the first signs of a life-threatening allergic reaction. THIS IS AN EMERGENCY. Call 911 for medical help. MAKE SURE YOU:   Understand these instructions.   Will watch your condition.   Will get help right away if you are not doing well or get worse.  Document Released: 09/28/2005 Document Revised: 06/10/2011 Document Reviewed: 05/18/2008 [employer] Patient Information 2012 [employer]  Call us if no improvement or call your allergist, Dr Rosalva Ferron  If 1 Hydroxyzine provides inadequate relief can try a second one, if that works better without any excessive side effects then call and let us know so we can change the strength

## 2011-10-20 NOTE — Progress Notes (Signed)
Patient ID: Jenny [name], female   DOB: August 29, 1941, 71 y.o.   MRN: [id number] Jenny [name] [id number] 1940/10/14 10/20/2011      Progress Note-Follow Up  Subjective  Chief Complaint  Chief Complaint  Patient presents with  . Follow-up    1 month follow up    HPI  Patient is a 71 year old Caucasian female in today for followup. She continues to struggle with urticaria. Still denies any new products or contacts. Does note this itching is present all day but does not keep her up at night. She continues to have scattered in the lesions occur. She has been using six Benadryl a day and gets partial relief from that temporarily. She is on a Medrol taper presently. No other c/o. Denies any polyuria, polydipsia, fevers, headache, congestion, chest pain, palpitations, shortness of breath, GI or GU complaints at this time. Her back continues to recover from her surgery 2 months ago. She no longer needs an assistive device and is much steadier on her feet. No significant pain is noted at this time. Her cold and uncomfortable feeling in her feet has resolved since her last visit. She has been taking her Synthroid regularly as prescribed. Past Medical History  Diagnosis Date  . Allergy     rhinitis  . Bronchitis   . Venous insufficiency   . Obesity   . Hypothyroidism   . GERD (gastroesophageal reflux disease)   . Diverticulosis of colon   . Urinary incontinence   . Degenerative joint disease   . Lumbar back pain   . Vitamin D deficiency   . Dysthymia   . Asthma     mild, intermittent  . Transient memory loss March 2006    after a stressful event she had no memory the rest of that day  . VITAMIN D DEFICIENCY 08/21/2008  . VENOUS INSUFFICIENCY 08/23/2007  . Other urinary incontinence 03/12/2010  . OBESITY 08/23/2007  . HYPOTHYROIDISM 08/23/2007  . HAND PAIN 04/09/2010  . GERD 08/23/2007  . 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  . DIVERTICULOSIS OF COLON 02/22/2008  . DIABETES MELLITUS, BORDERLINE  08/21/2008  . DEGENERATIVE JOINT DISEASE 02/22/2008  . BRONCHITIS, RECURRENT 02/22/2008  . BACK PAIN, LUMBAR 08/23/2007  . ALLERGIC RHINITIS 08/23/2007  . Asthma 01/14/2011  . 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 History of chicken pox 01/14/2011  . History of measles 01/14/2011  . PONV (postoperative nausea and vomiting)   . Peripheral neuropathy 02/12/2011    possibly related to spondylolisthesis per pt  . Urinary frequency   . Cough     usually not productive  . Incontinence of urine   . Obstructive sleep apnea     does not use CPAP, lost weight  . SLEEP APNEA, OBSTRUCTIVE 08/23/2007  . Hypertension   . HYPERTENSION 02/22/2008  . Anemia 09/21/2011    Past Surgical History  Procedure Date  . Appendectomy   . Tonsillectomy and adenoidectomy   . Ap repair and sling for cystocele 8-04     Dr Patsi Sears  . Left hand 2010    left thumb, joint removal, [facility]  . Ctr     bilateral  . Shoulder arthroscopy distal clavicle excision and open rotator cuff repair     right  . Neck surgery     x 2 - discectomy, fusion  . Abdominal hysterectomy     took one ovary  . Cholecystectomy   . Carpal tunel 2008    Family History  Problem Relation Age of Onset  .  COPD Mother   . Pneumonia Mother   . Other Mother     CHF  . Hypertension Mother   . Arthritis Mother     septic knee after replacement  . Heart disease Mother     chf  . Dementia Father   . Arthritis Brother   . Asthma Daughter   . Cancer Maternal Aunt     breast  . Stroke Maternal Grandmother   . Heart disease Maternal Grandmother   . Other Maternal Grandfather     CHF  . Heart disease Maternal Grandfather     chf  . Cancer Paternal Grandmother     stomach?  . Other Paternal Grandfather     problems with kidneys  . Kidney disease Paternal Grandfather   . Anxiety disorder Daughter   . Cancer Maternal Aunt     liver    History   Social History  . Marital Status: Widowed    Spouse Name: N/A    Number of Children:  N/A  . Years of Education: N/A   Occupational History  . Not on file.   Social History Main Topics  . Smoking status: Former Smoker -- 1.5 packs/day for 30 years    Types: Cigarettes    Quit date: 10/13/1991  . Smokeless tobacco: Never Used  . Alcohol Use: No  . Drug Use: No  . Sexually Active: No   Other Topics Concern  . Not on file   Social History Narrative  . No narrative on file    Current Outpatient Prescriptions on File Prior to Visit  Medication Sig Dispense Refill  . albuterol (PROVENTIL HFA;VENTOLIN HFA) 108 (90 BASE) MCG/ACT inhaler Inhale 2 puffs into the lungs every 6 (six) hours as needed. For wheezing       . ALPRAZolam (XANAX) 0.5 MG tablet Take 0.5 mg by mouth 3 (three) times daily as needed. For anxiety      . aspirin EC 81 MG tablet Take 81 mg by mouth daily.        . benzonatate (TESSALON PERLES) 100 MG capsule Take 1 capsule (100 mg total) by mouth 3 (three) times daily as needed for cough.  30 capsule  0  . budesonide-formoterol (SYMBICORT) 160-4.5 MCG/ACT inhaler Inhale 1 puff into the lungs 2 (two) times daily.        . calcium carbonate (OS-CAL) 600 MG TABS Take 600 mg by mouth 2 (two) times daily with a meal.       . Cholecalciferol (VITAMIN D) 1000 UNITS capsule Take 1,000 Units by mouth daily.       . clotrimazole-betamethasone (LOTRISONE) cream Apply 1 application topically 2 (two) times daily as needed. Rash, itching       . esomeprazole (NEXIUM) 40 MG capsule Take 40 mg by mouth daily before breakfast.        . furosemide (LASIX) 40 MG tablet Take 40 mg by mouth daily as needed. For swelling        . levothyroxine (SYNTHROID, LEVOTHROID) 125 MCG tablet Take 1 daily and 2 on Saturday  102 tablet  1  . methylPREDNISolone (MEDROL) 4 MG tablet Take 8 mg by mouth daily.        Marland Kitchen guaiFENesin (MUCINEX) 600 MG 12 hr tablet Take 1,200 mg by mouth 2 (two) times daily as needed.        No current facility-administered medications on file prior to visit.     Allergies  Allergen Reactions  . Codeine  REACTION: nausea  . Prednisone     "I forget who I am"  Medrol is ok    Review of Systems  Review of Systems  Constitutional: Negative for fever and malaise/fatigue.  HENT: Negative for congestion.   Eyes: Negative for discharge.  Respiratory: Negative for shortness of breath.   Cardiovascular: Negative for chest pain, palpitations and leg swelling.  Gastrointestinal: Negative for nausea, abdominal pain and diarrhea.  Genitourinary: Negative for dysuria.  Musculoskeletal: Negative for falls.  Skin: Positive for itching and rash.  Neurological: Negative for loss of consciousness and headaches.  Endo/Heme/Allergies: Negative for polydipsia.  Psychiatric/Behavioral: Negative for depression and suicidal ideas. The patient is not nervous/anxious and does not have insomnia.     Objective  BP 140/98  Pulse 82  Temp(Src) 97.1 F (36.2 C) (Temporal)  Ht 5\' 5"  (1.651 m)  Wt 226 lb 12.8 oz (102.876 kg)  BMI 37.74 kg/m2  SpO2 97%  Physical Exam  Physical Exam  Constitutional: She is oriented to person, place, and time and well-developed, well-nourished, and in no distress. No distress.  HENT:  Head: Normocephalic and atraumatic.  Eyes: Conjunctivae are normal.  Neck: Neck supple. No thyromegaly present.  Cardiovascular: Normal rate, regular rhythm and normal heart sounds.   No murmur heard. Pulmonary/Chest: Effort normal and breath sounds normal. She has no wheezes.  Abdominal: She exhibits no distension and no mass.  Musculoskeletal: She exhibits no edema.  Lymphadenopathy:    She has no cervical adenopathy.  Neurological: She is alert and oriented to person, place, and time.  Skin: Skin is warm and dry. Rash noted. She is not diaphoretic. There is erythema.       Scattered, slightly raised, erythematous circular lesions scattered over arms and trunk. Roughly 1 cm in diameter each  Psychiatric: Memory, affect and judgment  normal.    Lab Results  Component Value Date   TSH 24.720* 09/21/2011   Lab Results  Component Value Date   WBC 9.7 09/21/2011   HGB 12.7 09/21/2011   HCT 41.5 09/21/2011   MCV 93.7 09/21/2011   PLT 245 09/21/2011   Lab Results  Component Value Date   CREATININE 0.94 09/21/2011   BUN 17 09/21/2011   NA 140 09/21/2011   K 4.7 09/21/2011   CL 104 09/21/2011   CO2 26 09/21/2011   Lab Results  Component Value Date   ALT 18 08/25/2011   AST 27 08/25/2011   ALKPHOS 86 08/25/2011   BILITOT 0.5 08/25/2011   Lab Results  Component Value Date   CHOL 132 01/14/2011   Lab Results  Component Value Date   HDL 39.70 01/14/2011   Lab Results  Component Value Date   LDLCALC 76 01/14/2011   Lab Results  Component Value Date   TRIG 84.0 01/14/2011   Lab Results  Component Value Date   CHOLHDL 3 01/14/2011     Assessment & Plan  HYPERTENSION Started Lisinopril 5 mg today and will recheck at next visit.  DIABETES MELLITUS, BORDERLINE Carries this diagnosis from years ago, recent spot sugars acceptable, we will run a hgba1c today to further evaluate and patient will minimize simple carbs  Anemia Improved with last blood draw will continue to follow  BACK PAIN, LUMBAR Back slowly improving s/p surgery 2 months ago. Walking better without need for assistive device  HYPOTHYROIDISM Patient TSH very elevated at last check but she had missed a great many doses of her Synthroid, she is now taking 1 tab daily and  2 on Saturdays routinely, will recheck TSH today  Peripheral neuropathy Symptoms improved since last visit

## 2011-10-20 NOTE — Assessment & Plan Note (Signed)
Started Lisinopril 5 mg today and will recheck at next visit.

## 2011-10-20 NOTE — Assessment & Plan Note (Signed)
Back slowly improving s/p surgery 2 months ago. Walking better without need for assistive device

## 2011-10-20 NOTE — Assessment & Plan Note (Signed)
Carries this diagnosis from years ago, recent spot sugars acceptable, we will run a hgba1c today to further evaluate and patient will minimize simple carbs

## 2011-10-20 NOTE — Assessment & Plan Note (Signed)
Patient TSH very elevated at last check but she had missed a great many doses of her Synthroid, she is now taking 1 tab daily and 2 on Saturdays routinely, will recheck TSH today

## 2011-10-20 NOTE — Assessment & Plan Note (Signed)
Improved with last blood draw will continue to follow

## 2011-12-18 ENCOUNTER — Ambulatory Visit (INDEPENDENT_AMBULATORY_CARE_PROVIDER_SITE_OTHER): Payer: Medicare Other | Admitting: Family Medicine

## 2011-12-18 ENCOUNTER — Encounter: Payer: Self-pay | Admitting: Family Medicine

## 2011-12-18 VITALS — BP 134/85 | HR 76 | Temp 97.8°F | Ht 65.0 in | Wt 227.8 lb

## 2011-12-18 DIAGNOSIS — F411 Generalized anxiety disorder: Secondary | ICD-10-CM

## 2011-12-18 DIAGNOSIS — D649 Anemia, unspecified: Secondary | ICD-10-CM

## 2011-12-18 DIAGNOSIS — N390 Urinary tract infection, site not specified: Secondary | ICD-10-CM

## 2011-12-18 DIAGNOSIS — F419 Anxiety disorder, unspecified: Secondary | ICD-10-CM

## 2011-12-18 DIAGNOSIS — I872 Venous insufficiency (chronic) (peripheral): Secondary | ICD-10-CM

## 2011-12-18 DIAGNOSIS — E039 Hypothyroidism, unspecified: Secondary | ICD-10-CM

## 2011-12-18 DIAGNOSIS — I10 Essential (primary) hypertension: Secondary | ICD-10-CM

## 2011-12-18 DIAGNOSIS — K219 Gastro-esophageal reflux disease without esophagitis: Secondary | ICD-10-CM

## 2011-12-18 LAB — POCT URINALYSIS DIPSTICK
Bilirubin, UA: NEGATIVE
Blood, UA: NEGATIVE
Glucose, UA: NEGATIVE
Ketones, UA: NEGATIVE
Nitrite, UA: NEGATIVE
Protein, UA: NEGATIVE
Spec Grav, UA: 1.02
Urobilinogen, UA: 0.2
pH, UA: 5.5

## 2011-12-18 MED ORDER — ALPRAZOLAM 0.5 MG PO TABS: 0.5000 mg | ORAL_TABLET | Freq: Every day | ORAL | Status: DC | PRN

## 2011-12-18 MED ORDER — LEVOTHYROXINE SODIUM 125 MCG PO TABS: 125.0000 ug | ORAL_TABLET | Freq: Every day | ORAL | Status: DC

## 2011-12-18 NOTE — Assessment & Plan Note (Signed)
Resolved with most recent labs, no change in therapy

## 2011-12-18 NOTE — Patient Instructions (Signed)
Peripheral Vascular Disease  Peripheral Vascular Disease (PVD), also called Peripheral Arterial Disease (PAD), is a circulation problem caused by cholesterol (atherosclerotic plaque) deposits in the arteries. PVD commonly occurs in the lower extremities (legs) but it can occur in other areas of the body, such as your arms. The cholesterol buildup in the arteries reduces blood flow which can cause pain and other serious problems. The presence of PVD can place a person at risk for Coronary Artery Disease (CAD).   CAUSES   Causes of PVD can be many. It is usually associated with more than one risk factor such as:    High Cholesterol.   Smoking.   Diabetes.   Lack of exercise or inactivity.   High blood pressure (hypertension).   Obesity.   Family history.  SYMPTOMS    When the lower extremities are affected, patients with PVD may experience:   Leg pain with exertion or physical activity. This is called INTERMITTENT CLAUDICATION. This may present as cramping or numbness with physical activity. The location of the pain is associated with the level of blockage. For example, blockage at the abdominal level (distal abdominal aorta) may result in buttock or hip pain. Lower leg arterial blockage may result in calf pain.   As PVD becomes more severe, pain can develop with less physical activity.   In people with severe PVD, leg pain may occur at rest.   Other PVD signs and symptoms:   Leg numbness or weakness.   Coldness in the affected leg or foot, especially when compared to the other leg.   A change in leg color.   Patients with significant PVD are more prone to ulcers or sores on toes, feet or legs. These may take longer to heal or may reoccur. The ulcers or sores can become infected.   If signs and symptoms of PVD are ignored, gangrene may occur. This can result in the loss of toes or loss of an entire limb.   Not all leg pain is related to PVD. Other medical conditions can cause leg pain such  as:   Blood clots (embolism) or Deep Vein Thrombosis.   Inflammation of the blood vessels (vasculitis).   Spinal stenosis.  DIAGNOSIS   Diagnosis of PVD can involve several different types of tests. These can include:   Pulse Volume Recording Method (PVR). This test is simple, painless and does not involve the use of X-rays. PVR involves measuring and comparing the blood pressure in the arms and legs. An ABI (Ankle-Brachial Index) is calculated. The normal ratio of blood pressures is 1. As this number becomes smaller, it indicates more severe disease.   < 0.95 - indicates significant narrowing in one or more leg vessels.   <0.8 - there will usually be pain in the foot, leg or buttock with exercise.   <0.4 - will usually have pain in the legs at rest.   <0.25 - usually indicates limb threatening PVD.   Doppler detection of pulses in the legs. This test is painless and checks to see if you have a pulses in your legs/feet.   A dye or contrast material (a substance that highlights the blood vessels so they show up on x-ray) may be given to help your caregiver better see the arteries for the following tests. The dye is eliminated from your body by the kidney's. Your caregiver may order blood work to check your kidney function and other laboratory values before the following tests are performed:   Magnetic Resonance   Angiography (MRA). An MRA is a picture study of the blood vessels and arteries. The MRA machine uses a large magnet to produce images of the blood vessels.   Computed Tomography Angiography (CTA). A CTA is a specialized x-ray that looks at how the blood flows in your blood vessels. An IV may be inserted into your arm so contrast dye can be injected.   Angiogram. Is a procedure that uses x-rays to look at your blood vessels. This procedure is minimally invasive, meaning a small incision (cut) is made in your groin. A small tube (catheter) is then inserted into the artery of your groin. The catheter  is guided to the blood vessel or artery your caregiver wants to examine. Contrast dye is injected into the catheter. X-rays are then taken of the blood vessel or artery. After the images are obtained, the catheter is taken out.  TREATMENT   Treatment of PVD involves many interventions which may include:   Lifestyle changes:   Quitting smoking.   Exercise.   Following a low fat, low cholesterol diet.   Control of diabetes.   Foot care is very important to the PVD patient. Good foot care can help prevent infection.   Medication:   Cholesterol-lowering medicine.   Blood pressure medicine.   Anti-platelet drugs.   Certain medicines may reduce symptoms of Intermittent Claudication.   Interventional/Surgical options:   Angioplasty. An Angioplasty is a procedure that inflates a balloon in the blocked artery. This opens the blocked artery to improve blood flow.   Stent Implant. A wire mesh tube (stent) is placed in the artery. The stent expands and stays in place, allowing the artery to remain open.   Peripheral Bypass Surgery. This is a surgical procedure that reroutes the blood around a blocked artery to help improve blood flow. This type of procedure may be performed if Angioplasty or stent implants are not an option.  SEEK IMMEDIATE MEDICAL CARE IF:    You develop pain or numbness in your arms or legs.   Your arm or leg turns cold, becomes blue in color.   You develop redness, warmth, swelling and pain in your arms or legs.  MAKE SURE YOU:    Understand these instructions.   Will watch your condition.   Will get help right away if you are not doing well or get worse.  Document Released: 11/05/2004 Document Revised: 09/17/2011 Document Reviewed: 10/02/2008  [employer] Patient Information 2012 [employer]

## 2011-12-18 NOTE — Progress Notes (Signed)
Addended by: Danise Edge A on: 12/18/2011 01:05 PM   Modules accepted: Orders

## 2011-12-18 NOTE — Assessment & Plan Note (Signed)
In December TSH very elevated but she had missed many doses of her Synthroid, today she brings in [employer] results dated 11/25/11 showing a tsh of 0.91 and T4 of 12.4. Will drop her Levothyroxine to just 1 pill daily and recheck levels in 3months

## 2011-12-18 NOTE — Assessment & Plan Note (Signed)
Worsening discomfort, cold and violaceous to all toes both feet and significant superficial varicosities b/l lower extremities, will refer to [facility] for further work up, evaluation and treatment. She has seen them in the past.

## 2011-12-18 NOTE — Progress Notes (Signed)
Patient ID: Jenny [name], female   DOB: 02-24-1941, 71 y.o.   MRN: [id number] Jenny [name] [id number] 03/18/1941 12/18/2011      Progress Note-Follow Up  Subjective  Chief Complaint  Chief Complaint  Patient presents with  . Follow-up    2 month follow up    HPI  Patient is a 71 year old Caucasian female who is in today for followup. Overall she is feeling improved. She's had no acute illness, fevers, chills chronic urticaria, shortness of breath, chest pain palpitations. 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 As the gabapentin may help the burning slightly. She had hoped after her lumbar surgery her symptoms would improve more. She describes a numb sensation in down the left side of her right calf no similar sensation is noted on the left. Mostly though she is complaining that her toes feeling warm or cold somewhat numb but also tingling. This occurs on both feet equally. She has been taking her meds routinely and did have some lab work done recently with another physician which showed that her thyroid is now slightly overtreated after being undertreated bilateral hearing December. She knows she was missing lots of doses of levothyroxine when she was found to be undertreated.  Past Medical History  Diagnosis Date  . Allergy     rhinitis  . Bronchitis   . Venous insufficiency   . Obesity   . Hypothyroidism   . GERD (gastroesophageal reflux disease)   . Diverticulosis of colon   . Urinary incontinence   . Degenerative joint disease   . Lumbar back pain   . Vitamin d deficiency   . Dysthymia   . Asthma     mild, intermittent  . Transient memory loss March 2006    after a stressful event she had no memory the rest of that day  . VITAMIN D DEFICIENCY 08/21/2008  . VENOUS INSUFFICIENCY 08/23/2007  . Other urinary incontinence 03/12/2010  . OBESITY 08/23/2007  . HYPOTHYROIDISM 08/23/2007  . HAND PAIN 04/09/2010  . GERD 08/23/2007  . 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  .  DIVERTICULOSIS OF COLON 02/22/2008  . DIABETES MELLITUS, BORDERLINE 08/21/2008  . DEGENERATIVE JOINT DISEASE 02/22/2008  . BRONCHITIS, RECURRENT 02/22/2008  . BACK PAIN, LUMBAR 08/23/2007  . ALLERGIC RHINITIS 08/23/2007  . Asthma 01/14/2011  . 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 History of chicken pox 01/14/2011  . History of measles 01/14/2011  . PONV (postoperative nausea and vomiting)   . Peripheral neuropathy 02/12/2011    possibly related to spondylolisthesis per pt  . Urinary frequency   . Cough     usually not productive  . Incontinence of urine   . Obstructive sleep apnea     does not use CPAP, lost weight  . SLEEP APNEA, OBSTRUCTIVE 08/23/2007  . Hypertension   . HYPERTENSION 02/22/2008  . Anemia 09/21/2011    Past Surgical History  Procedure Date  . Appendectomy   . Tonsillectomy and adenoidectomy   . Ap repair and sling for cystocele 8-04     Dr Patsi Sears  . Left hand 2010    left thumb, joint removal, [facility]  . Ctr     bilateral  . Shoulder arthroscopy distal clavicle excision and open rotator cuff repair     right  . Neck surgery     x 2 - discectomy, fusion  . Abdominal hysterectomy     took one ovary  . Cholecystectomy   . Carpal tunel 2008    Family History  Problem Relation Age of Onset  . COPD Mother   . Pneumonia Mother   . Other Mother     CHF  . Hypertension Mother   . Arthritis Mother     septic knee after replacement  . Heart disease Mother     chf  . Dementia Father   . Arthritis Brother   . Asthma Daughter   . Cancer Maternal Aunt     breast  . Stroke Maternal Grandmother   . Heart disease Maternal Grandmother   . Other Maternal Grandfather     CHF  . Heart disease Maternal Grandfather     chf  . Cancer Paternal Grandmother     stomach?  . Other Paternal Grandfather     problems with kidneys  . Kidney disease Paternal Grandfather   . Anxiety disorder Daughter   . Cancer Maternal Aunt     liver    History   Social History  .  Marital Status: Widowed    Spouse Name: N/A    Number of Children: N/A  . Years of Education: N/A   Occupational History  . Not on file.   Social History Main Topics  . Smoking status: Former Smoker -- 1.5 packs/day for 30 years    Types: Cigarettes    Quit date: 10/13/1991  . Smokeless tobacco: Never Used  . Alcohol Use: No  . Drug Use: No  . Sexually Active: No   Other Topics Concern  . Not on file   Social History Narrative  . No narrative on file    Current Outpatient Prescriptions on File Prior to Visit  Medication Sig Dispense Refill  . albuterol (PROVENTIL HFA;VENTOLIN HFA) 108 (90 BASE) MCG/ACT inhaler Inhale 2 puffs into the lungs every 6 (six) hours as needed. For wheezing       . aspirin EC 81 MG tablet Take 81 mg by mouth daily.        . budesonide-formoterol (SYMBICORT) 160-4.5 MCG/ACT inhaler Inhale 1 puff into the lungs 2 (two) times daily.        . calcium carbonate (OS-CAL) 600 MG TABS Take 600 mg by mouth 2 (two) times daily with a meal.       . Cholecalciferol (VITAMIN D) 1000 UNITS capsule Take 1,000 Units by mouth daily.       . furosemide (LASIX) 40 MG tablet Take 40 mg by mouth daily as needed. For swelling        . lisinopril (PRINIVIL,ZESTRIL) 5 MG tablet Take 1 tablet (5 mg total) by mouth daily.  90 tablet  3  . benzonatate (TESSALON PERLES) 100 MG capsule Take 1 capsule (100 mg total) by mouth 3 (three) times daily as needed for cough.  30 capsule  0  . clotrimazole-betamethasone (LOTRISONE) cream Apply 1 application topically 2 (two) times daily as needed. Rash, itching       . guaiFENesin (MUCINEX) 600 MG 12 hr tablet Take 1,200 mg by mouth 2 (two) times daily as needed.         Allergies  Allergen Reactions  . Codeine     REACTION: nausea  . Prednisone     "I forget who I am"  Medrol is ok    Review of Systems  Review of Systems  Constitutional: Negative for fever and malaise/fatigue.  HENT: Negative for congestion.   Eyes: Negative  for discharge.  Respiratory: Negative for shortness of breath.   Cardiovascular: Negative for chest pain, palpitations and leg swelling.  Gastrointestinal: Negative for nausea, abdominal pain and diarrhea.  Genitourinary: Negative for dysuria.  Musculoskeletal: Negative for falls.       Cold feet  Skin: Negative for rash.  Neurological: Positive for tingling and sensory change. Negative for loss of consciousness and headaches.       Describes burning and decreased sensation in toes and numbness along lateral calf right leg.  Endo/Heme/Allergies: Negative for polydipsia.  Psychiatric/Behavioral: Negative for depression and suicidal ideas. The patient is not nervous/anxious and does not have insomnia.     Objective  BP 134/85  Pulse 76  Temp(Src) 97.8 F (36.6 C) (Temporal)  Ht 5\' 5"  (1.651 m)  Wt 227 lb 12.8 oz (103.329 kg)  BMI 37.91 kg/m2  SpO2 99%  Physical Exam  Physical Exam  Constitutional: She is oriented to person, place, and time and well-developed, well-nourished, and in no distress. No distress.  HENT:  Head: Normocephalic and atraumatic.  Eyes: Conjunctivae are normal.  Neck: Neck supple. No thyromegaly present.  Cardiovascular: Normal rate, regular rhythm and normal heart sounds.        Diminished posterior tibial and dorsalis pedis pulses b/l  Pulmonary/Chest: Effort normal and breath sounds normal. She has no wheezes.  Abdominal: She exhibits no distension and no mass.  Musculoskeletal: She exhibits no edema and no tenderness.       Arms: Lymphadenopathy:    She has no cervical adenopathy.  Neurological: She is alert and oriented to person, place, and time.  Skin: Skin is warm and dry. No rash noted. She is not diaphoretic.  Psychiatric: Memory, affect and judgment normal.    Lab Results  Component Value Date   TSH 0.55 10/20/2011   Lab Results  Component Value Date   WBC 11.2* 10/20/2011   HGB 13.4 10/20/2011   HCT 41.0 10/20/2011   MCV 90.8 10/20/2011    PLT 254.0 10/20/2011   Lab Results  Component Value Date   CREATININE 0.9 10/20/2011   BUN 20 10/20/2011   NA 141 10/20/2011   K 4.2 10/20/2011   CL 105 10/20/2011   CO2 25 10/20/2011   Lab Results  Component Value Date   ALT 18 08/25/2011   AST 27 08/25/2011   ALKPHOS 86 08/25/2011   BILITOT 0.5 08/25/2011   Lab Results  Component Value Date   CHOL 132 01/14/2011   Lab Results  Component Value Date   HDL 39.70 01/14/2011   Lab Results  Component Value Date   LDLCALC 76 01/14/2011   Lab Results  Component Value Date   TRIG 84.0 01/14/2011   Lab Results  Component Value Date   CHOLHDL 3 01/14/2011     Assessment & Plan  VENOUS INSUFFICIENCY Worsening discomfort, cold and violaceous to all toes both feet and significant superficial varicosities b/l lower extremities, will refer to [facility] for further work up, evaluation and treatment. She has seen them in the past.  HYPERTENSION Doing well no change in therapy at this time  GERD Well controlled on current medications avoid offending foods  Anemia Resolved with most recent labs, no change in therapy  HYPOTHYROIDISM In December TSH very elevated but she had missed many doses of her Synthroid, today she brings in [employer] results dated 11/25/11 showing a tsh of 0.91 and T4 of 12.4. Will drop her Levothyroxine to just 1 pill daily and recheck levels in 3months

## 2011-12-18 NOTE — Progress Notes (Signed)
Addended by: Baldemar Lenis R on: 12/18/2011 02:31 PM   Modules accepted: Orders

## 2011-12-18 NOTE — Assessment & Plan Note (Signed)
Well controlled on current medications avoid offending foods

## 2011-12-18 NOTE — Assessment & Plan Note (Signed)
Doing well no change in therapy at this time

## 2011-12-20 LAB — URINE CULTURE: Colony Count: >=100000

## 2012-01-15 ENCOUNTER — Telehealth: Payer: Self-pay | Admitting: Family Medicine

## 2012-01-15 NOTE — Telephone Encounter (Signed)
Spoke to Jenny [name] and she states she saw Dr. Connye Burkitt on Monday and he was going to refer her back to PCP to refer to cardiology.  Advised we have not seen anything yet, but we will look.  She is OK with hearing from [place] next week.

## 2012-02-09 ENCOUNTER — Other Ambulatory Visit: Payer: Self-pay

## 2012-02-09 NOTE — Telephone Encounter (Signed)
Yes she can have a refill on the Lotrisone same sig , same amounts, 1 rf and Diflucan 150 mg 1 tab po q week x 2 weeks, disp # 2, 1rf

## 2012-02-09 NOTE — Telephone Encounter (Signed)
Pt would like to know if the Lotrisone cream can be called in for her bottom and something for a yeast infection? Pt states she is itching for a couple days. Please advise?

## 2012-02-10 MED ORDER — FLUCONAZOLE 150 MG PO TABS: 150.0000 mg | ORAL_TABLET | Freq: Once | ORAL | Status: DC

## 2012-02-10 MED ORDER — CLOTRIMAZOLE-BETAMETHASONE 1-0.05 % EX CREA: 1.0000 "application " | TOPICAL_CREAM | Freq: Two times a day (BID) | CUTANEOUS | Status: DC

## 2012-02-10 NOTE — Telephone Encounter (Signed)
RX sent and patient informed.  

## 2012-02-22 ENCOUNTER — Encounter: Payer: Self-pay | Admitting: Family Medicine

## 2012-02-22 ENCOUNTER — Ambulatory Visit (INDEPENDENT_AMBULATORY_CARE_PROVIDER_SITE_OTHER): Payer: Medicare Other | Admitting: Family Medicine

## 2012-02-22 VITALS — BP 147/86 | HR 86 | Temp 97.6°F | Ht 65.0 in | Wt 227.0 lb

## 2012-02-22 DIAGNOSIS — J42 Unspecified chronic bronchitis: Secondary | ICD-10-CM

## 2012-02-22 DIAGNOSIS — J40 Bronchitis, not specified as acute or chronic: Secondary | ICD-10-CM

## 2012-02-22 MED ORDER — AMOXICILLIN-POT CLAVULANATE 875-125 MG PO TABS: 1.0000 | ORAL_TABLET | Freq: Two times a day (BID) | ORAL | Status: AC

## 2012-02-22 MED ORDER — METHYLPREDNISOLONE 4 MG PO KIT: PACK | ORAL | Status: DC

## 2012-02-22 NOTE — Patient Instructions (Signed)

## 2012-02-28 NOTE — Assessment & Plan Note (Signed)
Acute again. Antibiotics and steroids prescribed. Increase rest and fluids and report if no resolution

## 2012-02-28 NOTE — Progress Notes (Signed)
Patient ID: Jenny [name], female   DOB: 09/26/1941, 71 y.o.   MRN: [id number] MACLAINE AHOLA [id number] 08-04-1941 02/28/2012      Progress Note-Follow Up  Subjective  Chief Complaint  Chief Complaint  Patient presents with  . Cough    X 2 weeks  . Wheezing    X 2 weeks    HPI  Patient is a 71 year old Caucasian female with a long history of recurrent bronchitis. She has been struggling with increased coughing, wheezing, sore throat and headache for several days now. She denies fevers and chills. Does struggle with fatigue and cough is largely dry but occasionally about interrupting her sleep. No chest pain, palpitations, GI or GU complaints.  Past Medical History  Diagnosis Date  . Allergy     rhinitis  . Bronchitis   . Venous insufficiency   . Obesity   . Hypothyroidism   . GERD (gastroesophageal reflux disease)   . Diverticulosis of colon   . Urinary incontinence   . Degenerative joint disease   . Lumbar back pain   . Vitamin d deficiency   . Dysthymia   . Asthma     mild, intermittent  . Transient memory loss March 2006    after a stressful event she had no memory the rest of that day  . VITAMIN D DEFICIENCY 08/21/2008  . VENOUS INSUFFICIENCY 08/23/2007  . Other urinary incontinence 03/12/2010  . OBESITY 08/23/2007  . HYPOTHYROIDISM 08/23/2007  . HAND PAIN 04/09/2010  . GERD 08/23/2007  . 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  . DIVERTICULOSIS OF COLON 02/22/2008  . DIABETES MELLITUS, BORDERLINE 08/21/2008  . DEGENERATIVE JOINT DISEASE 02/22/2008  . BRONCHITIS, RECURRENT 02/22/2008  . BACK PAIN, LUMBAR 08/23/2007  . ALLERGIC RHINITIS 08/23/2007  . Asthma 01/14/2011  . 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 History of chicken pox 01/14/2011  . History of measles 01/14/2011  . PONV (postoperative nausea and vomiting)   . Peripheral neuropathy 02/12/2011    possibly related to spondylolisthesis per pt  . Urinary frequency   . Cough     usually not productive  . Incontinence of urine    . Obstructive sleep apnea     does not use CPAP, lost weight  . SLEEP APNEA, OBSTRUCTIVE 08/23/2007  . Hypertension   . HYPERTENSION 02/22/2008  . Anemia 09/21/2011    Past Surgical History  Procedure Date  . Appendectomy   . Tonsillectomy and adenoidectomy   . Ap repair and sling for cystocele 8-04     Dr Patsi Sears  . Left hand 2010    left thumb, joint removal, [facility]  . Ctr     bilateral  . Shoulder arthroscopy distal clavicle excision and open rotator cuff repair     right  . Neck surgery     x 2 - discectomy, fusion  . Abdominal hysterectomy     took one ovary  . Cholecystectomy   . Carpal tunel 2008    Family History  Problem Relation Age of Onset  . COPD Mother   . Pneumonia Mother   . Other Mother     CHF  . Hypertension Mother   . Arthritis Mother     septic knee after replacement  . Heart disease Mother     chf  . Dementia Father   . Arthritis Brother   . Asthma Daughter   . Cancer Maternal Aunt     breast  . Stroke Maternal Grandmother   . Heart disease Maternal Grandmother   .  Other Maternal Grandfather     CHF  . Heart disease Maternal Grandfather     chf  . Cancer Paternal Grandmother     stomach?  . Other Paternal Grandfather     problems with kidneys  . Kidney disease Paternal Grandfather   . Anxiety disorder Daughter   . Cancer Maternal Aunt     liver    History   Social History  . Marital Status: Widowed    Spouse Name: N/A    Number of Children: N/A  . Years of Education: N/A   Occupational History  . Not on file.   Social History Main Topics  . Smoking status: Former Smoker -- 1.5 packs/day for 30 years    Types: Cigarettes    Quit date: 10/13/1991  . Smokeless tobacco: Never Used  . Alcohol Use: No  . Drug Use: No  . Sexually Active: No   Other Topics Concern  . Not on file   Social History Narrative  . No narrative on file    Current Outpatient Prescriptions on File Prior to Visit  Medication Sig Dispense  Refill  . albuterol (PROVENTIL HFA;VENTOLIN HFA) 108 (90 BASE) MCG/ACT inhaler Inhale 2 puffs into the lungs every 6 (six) hours as needed. For wheezing       . ALPRAZolam (XANAX) 0.5 MG tablet Take 1 tablet (0.5 mg total) by mouth daily as needed for sleep or anxiety. For anxiety  40 tablet  1  . aspirin EC 81 MG tablet Take 81 mg by mouth daily.        . budesonide-formoterol (SYMBICORT) 160-4.5 MCG/ACT inhaler Inhale 1 puff into the lungs 2 (two) times daily.        . calcium carbonate (OS-CAL) 600 MG TABS Take 600 mg by mouth 2 (two) times daily with a meal.       . cetirizine (ZYRTEC) 10 MG tablet Take 10 mg by mouth daily.      . Cholecalciferol (VITAMIN D) 1000 UNITS capsule Take 1,000 Units by mouth daily.       . clotrimazole-betamethasone (LOTRISONE) cream Apply 1 application topically 2 (two) times daily.  45 g  0  . guaiFENesin (MUCINEX) 600 MG 12 hr tablet Take 1,200 mg by mouth 2 (two) times daily as needed.       Marland Kitchen levothyroxine (SYNTHROID, LEVOTHROID) 125 MCG tablet Take 1 tablet (125 mcg total) by mouth daily. Take 1 daily and 2 on Saturday  102 tablet  1  . lisinopril (PRINIVIL,ZESTRIL) 5 MG tablet Take 1 tablet (5 mg total) by mouth daily.  90 tablet  3  . ranitidine (ZANTAC) 150 MG tablet Take 150 mg by mouth daily.      . fluconazole (DIFLUCAN) 150 MG tablet Take 1 tablet (150 mg total) by mouth once. X 2 weeks  2 tablet  1  . furosemide (LASIX) 40 MG tablet Take 40 mg by mouth daily as needed. For swelling        . DISCONTD: esomeprazole (NEXIUM) 40 MG capsule Take 40 mg by mouth daily before breakfast.          Allergies  Allergen Reactions  . Codeine     REACTION: nausea  . Prednisone     "I forget who I am"  Medrol is ok    Review of Systems  Review of Systems  Constitutional: Positive for malaise/fatigue. Negative for fever.  HENT: Positive for congestion and sore throat.   Eyes: Negative for discharge.  Respiratory:  Positive for cough. Negative for  shortness of breath.   Cardiovascular: Negative for chest pain, palpitations and leg swelling.  Gastrointestinal: Positive for vomiting. Negative for nausea, abdominal pain and diarrhea.  Genitourinary: Negative for dysuria.  Musculoskeletal: Negative for falls.  Skin: Negative for rash.  Neurological: Positive for headaches. Negative for loss of consciousness.  Endo/Heme/Allergies: Negative for polydipsia.  Psychiatric/Behavioral: Negative for depression and suicidal ideas. The patient is not nervous/anxious and does not have insomnia.     Objective  BP 147/86  Pulse 86  Temp(Src) 97.6 F (36.4 C) (Temporal)  Ht 5\' 5"  (1.651 m)  Wt 227 lb (102.967 kg)  BMI 37.77 kg/m2  SpO2 96%  Physical Exam  Physical Exam  Constitutional: She is oriented to person, place, and time and well-developed, well-nourished, and in no distress. No distress.  HENT:  Head: Normocephalic and atraumatic.  Eyes: Conjunctivae are normal.  Neck: Neck supple. No thyromegaly present.  Cardiovascular: Normal rate, regular rhythm and normal heart sounds.   No murmur heard. Pulmonary/Chest: Effort normal. She has wheezes.       Expiratory wheezing, decreased BS b/l bases  Abdominal: She exhibits no distension and no mass.  Musculoskeletal: She exhibits no edema.  Lymphadenopathy:    She has no cervical adenopathy.  Neurological: She is alert and oriented to person, place, and time.  Skin: Skin is warm and dry. No rash noted. She is not diaphoretic.  Psychiatric: Memory, affect and judgment normal.    Lab Results  Component Value Date   TSH 0.55 10/20/2011   Lab Results  Component Value Date   WBC 11.2* 10/20/2011   HGB 13.4 10/20/2011   HCT 41.0 10/20/2011   MCV 90.8 10/20/2011   PLT 254.0 10/20/2011   Lab Results  Component Value Date   CREATININE 0.9 10/20/2011   BUN 20 10/20/2011   NA 141 10/20/2011   K 4.2 10/20/2011   CL 105 10/20/2011   CO2 25 10/20/2011   Lab Results  Component Value Date   ALT 18  08/25/2011   AST 27 08/25/2011   ALKPHOS 86 08/25/2011   BILITOT 0.5 08/25/2011   Lab Results  Component Value Date   CHOL 132 01/14/2011   Lab Results  Component Value Date   HDL 39.70 01/14/2011   Lab Results  Component Value Date   LDLCALC 76 01/14/2011   Lab Results  Component Value Date   TRIG 84.0 01/14/2011   Lab Results  Component Value Date   CHOLHDL 3 01/14/2011     Assessment & Plan  BRONCHITIS, RECURRENT Acute again. Antibiotics and steroids prescribed. Increase rest and fluids and report if no resolution

## 2012-03-11 ENCOUNTER — Other Ambulatory Visit (INDEPENDENT_AMBULATORY_CARE_PROVIDER_SITE_OTHER): Payer: Medicare Other

## 2012-03-11 ENCOUNTER — Ambulatory Visit: Payer: Medicare Other | Admitting: Pulmonary Disease

## 2012-03-11 DIAGNOSIS — I10 Essential (primary) hypertension: Secondary | ICD-10-CM

## 2012-03-11 DIAGNOSIS — E039 Hypothyroidism, unspecified: Secondary | ICD-10-CM

## 2012-03-11 LAB — TSH: TSH: 0.06 u[IU]/mL — ABNORMAL LOW (ref 0.35–5.50)

## 2012-03-11 LAB — RENAL FUNCTION PANEL
Albumin: 3.5 g/dL (ref 3.5–5.2)
BUN: 13 mg/dL (ref 6–23)
CO2: 29 mEq/L (ref 19–32)
Calcium: 9 mg/dL (ref 8.4–10.5)
Chloride: 104 mEq/L (ref 96–112)
Creatinine, Ser: 0.9 mg/dL (ref 0.4–1.2)
GFR: 69.62 mL/min (ref >60.00)
Glucose, Bld: 109 mg/dL — ABNORMAL HIGH (ref 70–99)
Phosphorus: 2.8 mg/dL (ref 2.3–4.6)
Potassium: 4.9 mEq/L (ref 3.5–5.1)
Sodium: 140 mEq/L (ref 135–145)

## 2012-03-11 LAB — CBC
HCT: 40.6 % (ref 36.0–46.0)
Hemoglobin: 13.1 g/dL (ref 12.0–15.0)
MCHC: 32.2 g/dL (ref 30.0–36.0)
MCV: 89.7 fL (ref 78.0–100.0)
Platelets: 248 10*3/uL (ref 150.0–400.0)
RBC: 4.53 Mil/uL (ref 3.87–5.11)
RDW: 14.2 % (ref 11.5–14.6)
WBC: 6.8 10*3/uL (ref 4.5–10.5)

## 2012-03-11 LAB — HEPATIC FUNCTION PANEL
ALT: 13 U/L (ref 0–35)
AST: 13 U/L (ref 0–37)
Albumin: 3.5 g/dL (ref 3.5–5.2)
Alkaline Phosphatase: 77 U/L (ref 39–117)
Bilirubin, Direct: 0.2 mg/dL (ref 0.0–0.3)
Total Bilirubin: 0.4 mg/dL (ref 0.3–1.2)
Total Protein: 6.2 g/dL (ref 6.0–8.3)

## 2012-03-11 LAB — LIPID PANEL
Cholesterol: 114 mg/dL (ref 0–200)
HDL: 34.5 mg/dL — ABNORMAL LOW (ref >39.00)
LDL Cholesterol: 62 mg/dL (ref 0–99)
Total CHOL/HDL Ratio: 3
Triglycerides: 90 mg/dL (ref 0.0–149.0)
VLDL: 18 mg/dL (ref 0.0–40.0)

## 2012-03-11 LAB — T4, FREE: Free T4: 1.19 ng/dL (ref 0.60–1.60)

## 2012-03-18 ENCOUNTER — Encounter: Payer: Self-pay | Admitting: Family Medicine

## 2012-03-18 ENCOUNTER — Ambulatory Visit (INDEPENDENT_AMBULATORY_CARE_PROVIDER_SITE_OTHER): Payer: Medicare Other | Admitting: Family Medicine

## 2012-03-18 VITALS — BP 115/82 | HR 86 | Temp 98.2°F | Ht 65.0 in | Wt 224.1 lb

## 2012-03-18 DIAGNOSIS — N390 Urinary tract infection, site not specified: Secondary | ICD-10-CM

## 2012-03-18 DIAGNOSIS — E039 Hypothyroidism, unspecified: Secondary | ICD-10-CM

## 2012-03-18 DIAGNOSIS — G5790 Unspecified mononeuropathy of unspecified lower limb: Secondary | ICD-10-CM

## 2012-03-18 DIAGNOSIS — I739 Peripheral vascular disease, unspecified: Secondary | ICD-10-CM

## 2012-03-18 DIAGNOSIS — G4733 Obstructive sleep apnea (adult) (pediatric): Secondary | ICD-10-CM

## 2012-03-18 DIAGNOSIS — K219 Gastro-esophageal reflux disease without esophagitis: Secondary | ICD-10-CM

## 2012-03-18 DIAGNOSIS — I10 Essential (primary) hypertension: Secondary | ICD-10-CM

## 2012-03-18 LAB — POCT URINALYSIS DIPSTICK
Bilirubin, UA: NEGATIVE
Glucose, UA: NEGATIVE
Ketones, UA: NEGATIVE
Nitrite, UA: NEGATIVE
Protein, UA: NEGATIVE
Spec Grav, UA: 1.015
Urobilinogen, UA: 1
pH, UA: 7

## 2012-03-18 MED ORDER — CEFDINIR 300 MG PO CAPS: 300.0000 mg | ORAL_CAPSULE | Freq: Two times a day (BID) | ORAL | Status: AC

## 2012-03-18 MED ORDER — ALBUTEROL SULFATE HFA 108 (90 BASE) MCG/ACT IN AERS: 2.0000 | INHALATION_SPRAY | Freq: Four times a day (QID) | RESPIRATORY_TRACT | Status: DC | PRN

## 2012-03-18 NOTE — Patient Instructions (Addendum)

## 2012-03-18 NOTE — Assessment & Plan Note (Addendum)
Has not been using her CPAP routinely and is struggling with increased fatigue. Encouraged to use it routinely. She agrees

## 2012-03-18 NOTE — Assessment & Plan Note (Signed)
TSH low but free T4 normal and symptoms suggestive of under treatment not over treatment. Will leave dosing of levothyroxine the same for now and recheck levels in 3 months.

## 2012-03-22 ENCOUNTER — Encounter: Payer: Self-pay | Admitting: Family Medicine

## 2012-03-22 ENCOUNTER — Other Ambulatory Visit: Payer: Self-pay

## 2012-03-22 DIAGNOSIS — R0989 Other specified symptoms and signs involving the circulatory and respiratory systems: Secondary | ICD-10-CM

## 2012-03-22 DIAGNOSIS — M79609 Pain in unspecified limb: Secondary | ICD-10-CM

## 2012-03-23 ENCOUNTER — Encounter: Payer: Self-pay | Admitting: Family Medicine

## 2012-03-23 DIAGNOSIS — N390 Urinary tract infection, site not specified: Secondary | ICD-10-CM | POA: Insufficient documentation

## 2012-03-23 HISTORY — DX: Urinary tract infection, site not specified: N39.0

## 2012-03-23 NOTE — Progress Notes (Signed)
Patient ID: Jenny [name], female   DOB: 1941-03-03, 71 y.o.   MRN: [id number] KENIA TEAGARDEN [id number] 1940-11-03 03/23/2012      Progress Note-Follow Up  Subjective  Chief Complaint  Chief Complaint  Patient presents with  . Follow-up    3 month    HPI  Patient is a 71 year old Caucasian female who is in today for followup. Overall she reports she's doing well but she does note some urinary frequency, urgency and discomfort recently. No high-grade fevers chills. No significant increase in back pain. No chest pain, palpitations, shortness of breath. Allergies are manageable reflux is well controlled. Did undergo a cervical myelogram recently with Dr. Jeral Fruit. The  Past Medical History  Diagnosis Date  . Allergy     rhinitis  . Bronchitis   . Venous insufficiency   . Obesity   . Hypothyroidism   . GERD (gastroesophageal reflux disease)   . Diverticulosis of colon   . Urinary incontinence   . Degenerative joint disease   . Lumbar back pain   . Vitamin d deficiency   . Dysthymia   . Asthma     mild, intermittent  . Transient memory loss March 2006    after a stressful event she had no memory the rest of that day  . VITAMIN D DEFICIENCY 08/21/2008  . VENOUS INSUFFICIENCY 08/23/2007  . Other urinary incontinence 03/12/2010  . OBESITY 08/23/2007  . HYPOTHYROIDISM 08/23/2007  . HAND PAIN 04/09/2010  . GERD 08/23/2007  . 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  . DIVERTICULOSIS OF COLON 02/22/2008  . DIABETES MELLITUS, BORDERLINE 08/21/2008  . DEGENERATIVE JOINT DISEASE 02/22/2008  . BRONCHITIS, RECURRENT 02/22/2008  . BACK PAIN, LUMBAR 08/23/2007  . ALLERGIC RHINITIS 08/23/2007  . Asthma 01/14/2011  . 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 History of chicken pox 01/14/2011  . History of measles 01/14/2011  . PONV (postoperative nausea and vomiting)   . Peripheral neuropathy 02/12/2011    possibly related to spondylolisthesis per pt  . Urinary frequency   . Cough     usually not productive  .  Incontinence of urine   . Obstructive sleep apnea     does not use CPAP, lost weight  . SLEEP APNEA, OBSTRUCTIVE 08/23/2007  . Hypertension   . HYPERTENSION 02/22/2008  . Anemia 09/21/2011  . UTI (lower urinary tract infection) 03/23/2012    Past Surgical History  Procedure Date  . Appendectomy   . Tonsillectomy and adenoidectomy   . Ap repair and sling for cystocele 8-04     Dr Patsi Sears  . Left hand 2010    left thumb, joint removal, CTR  . Ctr     bilateral  . Shoulder arthroscopy distal clavicle excision and open rotator cuff repair     right  . Neck surgery     x 2 - discectomy, fusion  . Abdominal hysterectomy     took one ovary  . Cholecystectomy   . Carpal tunel 2008    Family History  Problem Relation Age of Onset  . COPD Mother   . Pneumonia Mother   . Other Mother     CHF  . Hypertension Mother   . Arthritis Mother     septic knee after replacement  . Heart disease Mother     chf  . Dementia Father   . Arthritis Brother   . Asthma Daughter   . Cancer Maternal Aunt     breast  . Stroke Maternal Grandmother   . Heart disease  Maternal Grandmother   . Other Maternal Grandfather     CHF  . Heart disease Maternal Grandfather     chf  . Cancer Paternal Grandmother     stomach?  . Other Paternal Grandfather     problems with kidneys  . Kidney disease Paternal Grandfather   . Anxiety disorder Daughter   . Cancer Maternal Aunt     liver    History   Social History  . Marital Status: Widowed    Spouse Name: N/A    Number of Children: N/A  . Years of Education: N/A   Occupational History  . Not on file.   Social History Main Topics  . Smoking status: Former Smoker -- 1.5 packs/day for 30 years    Types: Cigarettes    Quit date: 10/13/1991  . Smokeless tobacco: Never Used  . Alcohol Use: No  . Drug Use: No  . Sexually Active: No   Other Topics Concern  . Not on file   Social History Narrative  . No narrative on file    Current  Outpatient Prescriptions on File Prior to Visit  Medication Sig Dispense Refill  . albuterol (PROVENTIL HFA;VENTOLIN HFA) 108 (90 BASE) MCG/ACT inhaler Inhale 2 puffs into the lungs every 6 (six) hours as needed. For wheezing  1 Inhaler  6  . ALPRAZolam (XANAX) 0.5 MG tablet Take 1 tablet (0.5 mg total) by mouth daily as needed for sleep or anxiety. For anxiety  40 tablet  1  . aspirin EC 81 MG tablet Take 81 mg by mouth daily.        . budesonide-formoterol (SYMBICORT) 160-4.5 MCG/ACT inhaler Inhale 1 puff into the lungs 2 (two) times daily.        . calcium carbonate (OS-CAL) 600 MG TABS Take 600 mg by mouth 2 (two) times daily with a meal.       . cetirizine (ZYRTEC) 10 MG tablet Take 10 mg by mouth daily.      . Cholecalciferol (VITAMIN D) 1000 UNITS capsule Take 1,000 Units by mouth daily.       . clotrimazole-betamethasone (LOTRISONE) cream Apply 1 application topically 2 (two) times daily.  45 g  0  . furosemide (LASIX) 40 MG tablet Take 40 mg by mouth daily as needed. For swelling        . levothyroxine (SYNTHROID, LEVOTHROID) 125 MCG tablet Take 1 tablet (125 mcg total) by mouth daily. Take 1 daily and 2 on Saturday  102 tablet  1  . lisinopril (PRINIVIL,ZESTRIL) 5 MG tablet Take 1 tablet (5 mg total) by mouth daily.  90 tablet  3  . ranitidine (ZANTAC) 150 MG tablet Take 150 mg by mouth daily.      . fluconazole (DIFLUCAN) 150 MG tablet Take 1 tablet (150 mg total) by mouth once. X 2 weeks  2 tablet  1  . guaiFENesin (MUCINEX) 600 MG 12 hr tablet Take 1,200 mg by mouth 2 (two) times daily as needed.       Marland Kitchen DISCONTD: esomeprazole (NEXIUM) 40 MG capsule Take 40 mg by mouth daily before breakfast.          Allergies  Allergen Reactions  . Codeine     REACTION: nausea  . Prednisone     "I forget who I am"  Medrol is ok    Review of Systems  Review of Systems  Constitutional: Positive for malaise/fatigue. Negative for fever.  HENT: Negative for congestion.   Eyes: Negative  for  discharge.  Respiratory: Negative for shortness of breath.   Cardiovascular: Negative for chest pain, palpitations and leg swelling.  Gastrointestinal: Negative for nausea, abdominal pain and diarrhea.  Genitourinary: Positive for dysuria and frequency.  Musculoskeletal: Negative for falls.  Skin: Negative for rash.  Neurological: Negative for loss of consciousness and headaches.  Endo/Heme/Allergies: Negative for polydipsia.  Psychiatric/Behavioral: Negative for depression and suicidal ideas. The patient is not nervous/anxious and does not have insomnia.     Objective  BP 115/82  Pulse 86  Temp 98.2 F (36.8 C) (Temporal)  Ht 5\' 5"  (1.651 m)  Wt 224 lb 1.9 oz (101.66 kg)  BMI 37.30 kg/m2  SpO2 96%  Physical Exam  Physical Exam  Constitutional: She is oriented to person, place, and time and well-developed, well-nourished, and in no distress. No distress.  HENT:  Head: Normocephalic and atraumatic.  Eyes: Conjunctivae are normal.  Neck: Neck supple. No thyromegaly present.  Cardiovascular: Normal rate, regular rhythm and normal heart sounds.   Pulmonary/Chest: Effort normal and breath sounds normal. She has no wheezes.  Abdominal: She exhibits no distension and no mass.  Musculoskeletal: She exhibits no edema.  Lymphadenopathy:    She has no cervical adenopathy.  Neurological: She is alert and oriented to person, place, and time.  Skin: Skin is warm and dry. No rash noted. She is not diaphoretic.  Psychiatric: Memory, affect and judgment normal.    Lab Results  Component Value Date   TSH 0.06* 03/11/2012   Lab Results  Component Value Date   WBC 6.8 03/11/2012   HGB 13.1 03/11/2012   HCT 40.6 03/11/2012   MCV 89.7 03/11/2012   PLT 248.0 03/11/2012   Lab Results  Component Value Date   CREATININE 0.9 03/11/2012   BUN 13 03/11/2012   NA 140 03/11/2012   K 4.9 03/11/2012   CL 104 03/11/2012   CO2 29 03/11/2012   Lab Results  Component Value Date   ALT 13 03/11/2012    AST 13 03/11/2012   ALKPHOS 77 03/11/2012   BILITOT 0.4 03/11/2012   Lab Results  Component Value Date   CHOL 114 03/11/2012   Lab Results  Component Value Date   HDL 34.50* 03/11/2012   Lab Results  Component Value Date   LDLCALC 62 03/11/2012   Lab Results  Component Value Date   TRIG 90.0 03/11/2012   Lab Results  Component Value Date   CHOLHDL 3 03/11/2012     Assessment & Plan  HYPOTHYROIDISM TSH low but free T4 normal and symptoms suggestive of under treatment not over treatment. Will leave dosing of levothyroxine the same for now and recheck levels in 3 months.  SLEEP APNEA, OBSTRUCTIVE Has not been using her CPAP routinely and is struggling with increased fatigue. Encouraged to use it routinely. She agrees  HYPERTENSION Well controlled at today's visit no concerns identified.  UTI (lower urinary tract infection) Started on Cefdinir bid, increase hydration, try cranberry.   GERD Adequately controlled

## 2012-03-23 NOTE — Assessment & Plan Note (Signed)
Started on Cefdinir bid, increase hydration, try cranberry.

## 2012-03-23 NOTE — Assessment & Plan Note (Signed)
Adequately controlled 

## 2012-03-23 NOTE — Assessment & Plan Note (Signed)
Well controlled at today's visit no concerns identified.

## 2012-05-05 ENCOUNTER — Encounter: Payer: Medicare Other | Admitting: Vascular Surgery

## 2012-06-01 ENCOUNTER — Encounter: Payer: Self-pay | Admitting: Vascular Surgery

## 2012-06-02 ENCOUNTER — Encounter (INDEPENDENT_AMBULATORY_CARE_PROVIDER_SITE_OTHER): Payer: Medicare Other | Admitting: *Deleted

## 2012-06-02 ENCOUNTER — Encounter: Payer: Medicare Other | Admitting: Vascular Surgery

## 2012-06-02 ENCOUNTER — Encounter: Payer: Medicare Other | Admitting: *Deleted

## 2012-06-02 ENCOUNTER — Encounter: Payer: Self-pay | Admitting: Vascular Surgery

## 2012-06-02 VITALS — BP 148/80 | HR 73 | Resp 16 | Ht 66.0 in | Wt 223.0 lb

## 2012-06-02 DIAGNOSIS — M79609 Pain in unspecified limb: Secondary | ICD-10-CM

## 2012-06-02 DIAGNOSIS — R0989 Other specified symptoms and signs involving the circulatory and respiratory systems: Secondary | ICD-10-CM

## 2012-06-02 DIAGNOSIS — I739 Peripheral vascular disease, unspecified: Secondary | ICD-10-CM

## 2012-06-17 ENCOUNTER — Ambulatory Visit (INDEPENDENT_AMBULATORY_CARE_PROVIDER_SITE_OTHER): Payer: Medicare Other | Admitting: Family Medicine

## 2012-06-17 ENCOUNTER — Encounter: Payer: Self-pay | Admitting: Family Medicine

## 2012-06-17 VITALS — BP 136/76 | HR 82 | Temp 97.4°F | Ht 65.0 in | Wt 223.8 lb

## 2012-06-17 DIAGNOSIS — R7309 Other abnormal glucose: Secondary | ICD-10-CM

## 2012-06-17 DIAGNOSIS — G629 Polyneuropathy, unspecified: Secondary | ICD-10-CM

## 2012-06-17 DIAGNOSIS — I10 Essential (primary) hypertension: Secondary | ICD-10-CM

## 2012-06-17 DIAGNOSIS — E039 Hypothyroidism, unspecified: Secondary | ICD-10-CM

## 2012-06-17 DIAGNOSIS — I739 Peripheral vascular disease, unspecified: Secondary | ICD-10-CM

## 2012-06-17 DIAGNOSIS — G609 Hereditary and idiopathic neuropathy, unspecified: Secondary | ICD-10-CM

## 2012-06-17 DIAGNOSIS — R739 Hyperglycemia, unspecified: Secondary | ICD-10-CM

## 2012-06-17 DIAGNOSIS — N952 Postmenopausal atrophic vaginitis: Secondary | ICD-10-CM

## 2012-06-17 HISTORY — DX: Postmenopausal atrophic vaginitis: N95.2

## 2012-06-17 LAB — RENAL FUNCTION PANEL
Albumin: 3.9 g/dL (ref 3.5–5.2)
BUN: 16 mg/dL (ref 6–23)
CO2: 27 mEq/L (ref 19–32)
Calcium: 9.6 mg/dL (ref 8.4–10.5)
Chloride: 108 mEq/L (ref 96–112)
Creatinine, Ser: 0.8 mg/dL (ref 0.4–1.2)
GFR: 79.16 mL/min (ref >60.00)
Glucose, Bld: 108 mg/dL — ABNORMAL HIGH (ref 70–99)
Phosphorus: 3.2 mg/dL (ref 2.3–4.6)
Potassium: 5.3 meq/L — ABNORMAL HIGH (ref 3.5–5.1)
Sodium: 142 meq/L (ref 135–145)

## 2012-06-17 LAB — T4, FREE: Free T4: 1 ng/dL (ref 0.60–1.60)

## 2012-06-17 LAB — TSH: TSH: 0.07 u[IU]/mL — ABNORMAL LOW (ref 0.35–5.50)

## 2012-06-17 MED ORDER — GABAPENTIN 100 MG PO CAPS: ORAL_CAPSULE | ORAL | Status: DC

## 2012-06-17 NOTE — Progress Notes (Signed)
Patient ID: Jenny [name], female   DOB: [birth year], 71 y.o.   MRN: [id number] Jenny [name] [id number] 02-03-1941 06/17/2012      Progress Note-Follow Up  Subjective  Chief Complaint  Chief Complaint  Patient presents with  . Follow-up    3 month    HPI  Patient is a 71 year old Caucasian female who is in today for followup. Overall she reports she's doing well. 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 She says her biggest complaint is her peripheral neuropathy. She has persistent tingling, numbness and pain in her feet and legs. She has recently undergone vascular evaluation and it has been confirmed that this is likely coming from her low back and the nervous system is supposed to her vascular. She denies any recent illness. She's had no fevers, chills, chest pain, palpitations, shortness of breath, GI complaints. She does note she continues to struggle with occasional irritation on her vulva which is itchy and raised. Good response to clotrimazole and betamethasone but then recurs the  Past Medical History  Diagnosis Date  . Allergy     rhinitis  . Bronchitis   . Venous insufficiency   . Obesity   . Hypothyroidism   . GERD (gastroesophageal reflux disease)   . Diverticulosis of colon   . Urinary incontinence   . Degenerative joint disease   . Lumbar back pain   . Vitamin d deficiency   . Dysthymia   . Asthma     mild, intermittent  . Transient memory loss March 2006    after a stressful event she had no memory the rest of that day  . VITAMIN D DEFICIENCY 08/21/2008  . VENOUS INSUFFICIENCY 08/23/2007  . Other urinary incontinence 03/12/2010  . OBESITY 08/23/2007  . HYPOTHYROIDISM 08/23/2007  . HAND PAIN 04/09/2010  . GERD 08/23/2007  . 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  . DIVERTICULOSIS OF COLON 02/22/2008  . DIABETES MELLITUS, BORDERLINE 08/21/2008  . DEGENERATIVE JOINT DISEASE 02/22/2008  . BRONCHITIS, RECURRENT 02/22/2008  . BACK PAIN, LUMBAR 08/23/2007    . ALLERGIC RHINITIS 08/23/2007  . Asthma 01/14/2011  . 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 History of chicken pox 01/14/2011  . History of measles 01/14/2011  . PONV (postoperative nausea and vomiting)   . Peripheral neuropathy 02/12/2011    possibly related to spondylolisthesis per pt  . Urinary frequency   . Cough     usually not productive  . Incontinence of urine   . Obstructive sleep apnea     does not use CPAP, lost weight  . SLEEP APNEA, OBSTRUCTIVE 08/23/2007  . Hypertension   . HYPERTENSION 02/22/2008  . Anemia 09/21/2011  . UTI (lower urinary tract infection) 03/23/2012  . Atrophic vaginitis 06/17/2012    Past Surgical History  Procedure Date  . Appendectomy   . Tonsillectomy and adenoidectomy   . Ap repair and sling for cystocele 8-04     Dr Patsi Sears  . Left hand 2010    left thumb, joint removal, CTR  . Ctr     bilateral  . Shoulder arthroscopy distal clavicle excision and open rotator cuff repair     right  . Neck surgery     x 2 - discectomy, fusion  . Abdominal hysterectomy     took one ovary  . Cholecystectomy   . Carpal tunel 2008    Family History  Problem Relation Age of Onset  . COPD Mother   . Pneumonia Mother   .  Other Mother     CHF  . Hypertension Mother   . Arthritis Mother     septic knee after replacement  . Heart disease Mother     chf  . Dementia Father   . Arthritis Brother   . Asthma Daughter   . Cancer Maternal Aunt     breast  . Stroke Maternal Grandmother   . Heart disease Maternal Grandmother   . Other Maternal Grandfather     CHF  . Heart disease Maternal Grandfather     chf  . Cancer Paternal Grandmother     stomach?  . Other Paternal Grandfather     problems with kidneys  . Kidney disease Paternal Grandfather   . Anxiety disorder Daughter   . Cancer Maternal Aunt     liver    History   Social History  . Marital Status: Widowed    Spouse Name: N/A    Number of Children: N/A  . Years of Education: N/A    Occupational History  . Not on file.   Social History Main Topics  . Smoking status: Former Smoker -- 1.5 packs/day for 30 years    Types: Cigarettes    Quit date: 10/13/1991  . Smokeless tobacco: Never Used  . Alcohol Use: No  . Drug Use: No  . Sexually Active: No   Other Topics Concern  . Not on file   Social History Narrative  . No narrative on file    Current Outpatient Prescriptions on File Prior to Visit  Medication Sig Dispense Refill  . albuterol (PROVENTIL HFA;VENTOLIN HFA) 108 (90 BASE) MCG/ACT inhaler Inhale 2 puffs into the lungs every 6 (six) hours as needed. For wheezing  1 Inhaler  6  . ALPRAZolam (XANAX) 0.5 MG tablet Take 1 tablet (0.5 mg total) by mouth daily as needed for sleep or anxiety. For anxiety  40 tablet  1  . aspirin EC 81 MG tablet Take 81 mg by mouth daily.        . budesonide-formoterol (SYMBICORT) 160-4.5 MCG/ACT inhaler Inhale 1 puff into the lungs 2 (two) times daily.        . calcium carbonate (OS-CAL) 600 MG TABS Take 600 mg by mouth 2 (two) times daily with a meal.       . cetirizine (ZYRTEC) 10 MG tablet Take 10 mg by mouth daily.      . Cholecalciferol (VITAMIN D) 1000 UNITS capsule Take 1,000 Units by mouth daily.       . clotrimazole-betamethasone (LOTRISONE) cream Apply 1 application topically 2 (two) times daily.  45 g  0  . cyclobenzaprine (FLEXERIL) 10 MG tablet Take 10 mg by mouth 3 (three) times daily as needed.      . fluconazole (DIFLUCAN) 150 MG tablet Take 1 tablet (150 mg total) by mouth once. X 2 weeks  2 tablet  1  . furosemide (LASIX) 40 MG tablet Take 40 mg by mouth daily as needed. For swelling        . guaiFENesin (MUCINEX) 600 MG 12 hr tablet Take 1,200 mg by mouth 2 (two) times daily as needed.       . hydrOXYzine (ATARAX/VISTARIL) 25 MG tablet Take 25 mg by mouth 3 (three) times daily as needed.      Marland Kitchen levothyroxine (SYNTHROID, LEVOTHROID) 125 MCG tablet Take 1 tablet (125 mcg total) by mouth daily. Take 1 daily and 2  on Saturday  102 tablet  1  . lisinopril (PRINIVIL,ZESTRIL) 5 MG tablet  Take 1 tablet (5 mg total) by mouth daily.  90 tablet  3  . ranitidine (ZANTAC) 150 MG tablet Take 150 mg by mouth daily.      Marland Kitchen DISCONTD: esomeprazole (NEXIUM) 40 MG capsule Take 40 mg by mouth daily before breakfast.        . DISCONTD: gabapentin (NEURONTIN) 100 MG capsule Take 100 mg by mouth 3 (three) times daily.        Allergies  Allergen Reactions  . Codeine     REACTION: nausea  . Prednisone     "I forget who I am"  Medrol is ok    Review of Systems  Review of Systems  Constitutional: Negative for fever and malaise/fatigue.  HENT: Negative for congestion.   Eyes: Negative for discharge.  Respiratory: Negative for shortness of breath.   Cardiovascular: Negative for chest pain, palpitations and leg swelling.  Gastrointestinal: Negative for nausea, abdominal pain and diarrhea.  Genitourinary: Negative for dysuria.  Musculoskeletal: Negative for falls.  Skin: Negative for rash.  Neurological: Positive for tingling and sensory change. Negative for dizziness, loss of consciousness and headaches.       In b/l feet and legs. Neurontin may help slightly but she is unable to tolerate hi doses without sedation  Endo/Heme/Allergies: Negative for polydipsia.  Psychiatric/Behavioral: Negative for depression and suicidal ideas. The patient is not nervous/anxious and does not have insomnia.     Objective  BP 136/76  Pulse 82  Temp 97.4 F (36.3 C) (Temporal)  Ht 5\' 5"  (1.651 m)  Wt 223 lb 12.8 oz (101.515 kg)  BMI 37.24 kg/m2  SpO2 98%  Physical Exam  Physical Exam  Constitutional: She is oriented to person, place, and time and well-developed, well-nourished, and in no distress. No distress.  HENT:  Head: Normocephalic and atraumatic.  Eyes: Conjunctivae are normal.  Neck: Neck supple. No thyromegaly present.  Cardiovascular: Normal rate, regular rhythm and normal heart sounds.   Pulmonary/Chest:  Effort normal and breath sounds normal. She has no wheezes.  Abdominal: She exhibits no distension and no mass.  Musculoskeletal: She exhibits no edema.  Lymphadenopathy:    She has no cervical adenopathy.  Neurological: She is alert and oriented to person, place, and time.  Skin: Skin is warm and dry. No rash noted. She is not diaphoretic.  Psychiatric: Memory, affect and judgment normal.    Lab Results  Component Value Date   TSH 0.06* 03/11/2012   Lab Results  Component Value Date   WBC 6.8 03/11/2012   HGB 13.1 03/11/2012   HCT 40.6 03/11/2012   MCV 89.7 03/11/2012   PLT 248.0 03/11/2012   Lab Results  Component Value Date   CREATININE 0.9 03/11/2012   BUN 13 03/11/2012   NA 140 03/11/2012   K 4.9 03/11/2012   CL 104 03/11/2012   CO2 29 03/11/2012   Lab Results  Component Value Date   ALT 13 03/11/2012   AST 13 03/11/2012   ALKPHOS 77 03/11/2012   BILITOT 0.4 03/11/2012   Lab Results  Component Value Date   CHOL 114 03/11/2012   Lab Results  Component Value Date   HDL 34.50* 03/11/2012   Lab Results  Component Value Date   LDLCALC 62 03/11/2012   Lab Results  Component Value Date   TRIG 90.0 03/11/2012   Lab Results  Component Value Date   CHOLHDL 3 03/11/2012     Assessment & Plan  Peripheral vascular disease, unspecified Recent ABI studies with vascular  were reassuring that her neuropathy and lower extremity pain is not being caused by a vascular process.  HYPERTENSION Adequately controlled on recheck, no changes, minimize sodium  HYPOTHYROIDISM Will repeat thyroid studies, continue current dosing of meds  Atrophic vaginitis She is having some recurrent irritation at this time, it is responding to her clotrimazole/betamethasone cream she will add Witch [employer] Astringent prn and we will make her next appt a gyn visit and reexamine at that time  Peripheral neuropathy Will try slowly titrating up the Gabapentin to see if she tolerates and gets some benefit.  Increase to 200 mg bid and 300 mg qhs as tolerated for now

## 2012-06-17 NOTE — Assessment & Plan Note (Signed)
Will try slowly titrating up the Gabapentin to see if she tolerates and gets some benefit. Increase to 200 mg bid and 300 mg qhs as tolerated for now

## 2012-06-17 NOTE — Patient Instructions (Addendum)

## 2012-06-17 NOTE — Assessment & Plan Note (Addendum)
Will repeat thyroid studies, continue current dosing of meds

## 2012-06-17 NOTE — Assessment & Plan Note (Addendum)
She is having some recurrent irritation at this time, it is responding to her clotrimazole/betamethasone cream she will add [employer] Astringent prn and we will make her next appt a gyn visit and reexamine at that time

## 2012-06-17 NOTE — Assessment & Plan Note (Signed)
Recent ABI studies with vascular were reassuring that her neuropathy and lower extremity pain is not being caused by a vascular process.

## 2012-06-17 NOTE — Assessment & Plan Note (Signed)
Adequately controlled on recheck, no changes, minimize sodium

## 2012-06-20 NOTE — Addendum Note (Signed)
Addended by: Court Joy on: 06/20/2012 11:54 AM   Modules accepted: Orders

## 2012-07-19 ENCOUNTER — Other Ambulatory Visit: Payer: Self-pay

## 2012-07-19 MED ORDER — LEVOTHYROXINE SODIUM 125 MCG PO TABS: 125.0000 ug | ORAL_TABLET | Freq: Every day | ORAL | Status: DC

## 2012-09-26 ENCOUNTER — Other Ambulatory Visit (INDEPENDENT_AMBULATORY_CARE_PROVIDER_SITE_OTHER): Payer: Medicare Other

## 2012-09-26 DIAGNOSIS — R7309 Other abnormal glucose: Secondary | ICD-10-CM

## 2012-09-26 DIAGNOSIS — E039 Hypothyroidism, unspecified: Secondary | ICD-10-CM

## 2012-09-26 DIAGNOSIS — I10 Essential (primary) hypertension: Secondary | ICD-10-CM

## 2012-09-26 LAB — CBC
HCT: 42 % (ref 36.0–46.0)
Hemoglobin: 14.1 g/dL (ref 12.0–15.0)
MCHC: 33.4 g/dL (ref 30.0–36.0)
MCV: 89 fL (ref 78.0–100.0)
Platelets: 288 10*3/uL (ref 150.0–400.0)
RBC: 4.72 Mil/uL (ref 3.87–5.11)
RDW: 13.7 % (ref 11.5–14.6)
WBC: 6.9 10*3/uL (ref 4.5–10.5)

## 2012-09-26 LAB — RENAL FUNCTION PANEL
Albumin: 3.9 g/dL (ref 3.5–5.2)
BUN: 15 mg/dL (ref 6–23)
CO2: 30 meq/L (ref 19–32)
Calcium: 9.1 mg/dL (ref 8.4–10.5)
Chloride: 102 mEq/L (ref 96–112)
Creatinine, Ser: 0.9 mg/dL (ref 0.4–1.2)
GFR: 64.25 mL/min (ref >60.00)
Glucose, Bld: 102 mg/dL — ABNORMAL HIGH (ref 70–99)
Phosphorus: 3 mg/dL (ref 2.3–4.6)
Potassium: 4.5 meq/L (ref 3.5–5.1)
Sodium: 139 meq/L (ref 135–145)

## 2012-09-26 LAB — LIPID PANEL
Cholesterol: 119 mg/dL (ref 0–200)
HDL: 33.6 mg/dL — ABNORMAL LOW (ref >39.00)
LDL Cholesterol: 57 mg/dL (ref 0–99)
Total CHOL/HDL Ratio: 4
Triglycerides: 144 mg/dL (ref 0.0–149.0)
VLDL: 28.8 mg/dL (ref 0.0–40.0)

## 2012-09-26 LAB — HEPATIC FUNCTION PANEL
ALT: 12 U/L (ref 0–35)
AST: 15 U/L (ref 0–37)
Albumin: 3.9 g/dL (ref 3.5–5.2)
Alkaline Phosphatase: 73 U/L (ref 39–117)
Bilirubin, Direct: 0 mg/dL (ref 0.0–0.3)
Total Bilirubin: 0.5 mg/dL (ref 0.3–1.2)
Total Protein: 7.2 g/dL (ref 6.0–8.3)

## 2012-09-26 LAB — TSH: TSH: 0.18 u[IU]/mL — ABNORMAL LOW (ref 0.35–5.50)

## 2012-09-27 LAB — HEMOGLOBIN A1C: Hgb A1c MFr Bld: 6.1 % (ref 4.6–6.5)

## 2012-09-30 ENCOUNTER — Other Ambulatory Visit (HOSPITAL_COMMUNITY)
Admission: RE | Admit: 2012-09-30 | Discharge: 2012-09-30 | Disposition: A | Discharge status: Home / Self Care | Payer: Medicare Other | Source: Ambulatory Visit | Attending: Family Medicine | Admitting: Family Medicine

## 2012-09-30 ENCOUNTER — Encounter: Payer: Self-pay | Admitting: Family Medicine

## 2012-09-30 ENCOUNTER — Ambulatory Visit (INDEPENDENT_AMBULATORY_CARE_PROVIDER_SITE_OTHER): Payer: Medicare Other | Admitting: Family Medicine

## 2012-09-30 VITALS — BP 149/89 | HR 74 | Temp 97.6°F | Ht 65.0 in | Wt 218.8 lb

## 2012-09-30 DIAGNOSIS — J45909 Unspecified asthma, uncomplicated: Secondary | ICD-10-CM

## 2012-09-30 DIAGNOSIS — F329 Major depressive disorder, single episode, unspecified: Secondary | ICD-10-CM

## 2012-09-30 DIAGNOSIS — Z124 Encounter for screening for malignant neoplasm of cervix: Secondary | ICD-10-CM | POA: Insufficient documentation

## 2012-09-30 DIAGNOSIS — F341 Dysthymic disorder: Secondary | ICD-10-CM

## 2012-09-30 DIAGNOSIS — Z23 Encounter for immunization: Secondary | ICD-10-CM

## 2012-09-30 DIAGNOSIS — E039 Hypothyroidism, unspecified: Secondary | ICD-10-CM

## 2012-09-30 DIAGNOSIS — F32A Depression, unspecified: Secondary | ICD-10-CM

## 2012-09-30 DIAGNOSIS — G609 Hereditary and idiopathic neuropathy, unspecified: Secondary | ICD-10-CM

## 2012-09-30 DIAGNOSIS — N952 Postmenopausal atrophic vaginitis: Secondary | ICD-10-CM

## 2012-09-30 DIAGNOSIS — R7309 Other abnormal glucose: Secondary | ICD-10-CM

## 2012-09-30 DIAGNOSIS — F419 Anxiety disorder, unspecified: Secondary | ICD-10-CM

## 2012-09-30 DIAGNOSIS — I10 Essential (primary) hypertension: Secondary | ICD-10-CM

## 2012-09-30 DIAGNOSIS — D649 Anemia, unspecified: Secondary | ICD-10-CM

## 2012-09-30 DIAGNOSIS — G629 Polyneuropathy, unspecified: Secondary | ICD-10-CM

## 2012-09-30 MED ORDER — CITALOPRAM HYDROBROMIDE 10 MG PO TABS: 10.0000 mg | ORAL_TABLET | Freq: Every day | ORAL | Status: DC

## 2012-09-30 MED ORDER — ESTROGENS, CONJUGATED 0.625 MG/GM VA CREA: TOPICAL_CREAM | VAGINAL | Status: DC

## 2012-09-30 MED ORDER — ALPRAZOLAM 0.25 MG PO TABS: 0.2500 mg | ORAL_TABLET | Freq: Two times a day (BID) | ORAL | Status: DC | PRN

## 2012-09-30 MED ORDER — LEVOTHYROXINE SODIUM 125 MCG PO TABS: 125.0000 ug | ORAL_TABLET | Freq: Every day | ORAL | Status: DC

## 2012-09-30 NOTE — Assessment & Plan Note (Addendum)
Pap done today,continues to have a lesion on left vulva that is itching. Does have an irritated lesion on left front near clitoris started on Premarin cream small amount to lesion then will make an appt with her GYN, Dr Vincente Poli

## 2012-09-30 NOTE — Progress Notes (Signed)
Patient ID: Jenny [name], female   DOB: 1940/10/21, 71 y.o.   MRN: [id number] Jenny [name] [id number] 05/21/1941 09/30/2012      Progress Note-Follow Up  Subjective  Chief Complaint  Chief Complaint  Patient presents with  . Gynecologic Exam    pap and breast exam  . Annual Exam    physical  . Injections    flu    HPI  Patient is a 71 year old female in today for multiple concerns including annual GYN exam. She continues to struggle with a great deal of stress and anxiety history worried about her daughter her grandson..It difficult for major surgery for spinal. Her grandson struggles with emotional and mental issues and really is in need of counseling, medication for anger management. The patient herself and her advancing age is working as many as 100 hours a week to try and support regarding. As a result she reports she's very tired and very labile. She cries easily becomes very anxious and easily agitated. Struggling with anhedonia and depression but denies some teaching. Basically she's not had a lot of symptoms except for fatigue. No chest pain no palpitations, shortness of breath, GI or GU complaints noted. She does have persistent irritation on the anterior left vulva. Prescribed Monistat and cream she was given last year for possible yeast infection but unfortunately symptoms persist in the same general region. No pain or bleeding.  Past Medical History  Diagnosis Date  . Allergy     rhinitis  . Bronchitis   . Venous insufficiency   . Obesity   . Hypothyroidism   . GERD (gastroesophageal reflux disease)   . Diverticulosis of colon   . Urinary incontinence   . Degenerative joint disease   . Lumbar back pain   . Vitamin D deficiency   . Dysthymia   . Asthma     mild, intermittent  . Transient memory loss March 2006    after a stressful event she had no memory the rest of that day  . VITAMIN D DEFICIENCY 08/21/2008  . VENOUS INSUFFICIENCY 08/23/2007  . Other  urinary incontinence 03/12/2010  . OBESITY 08/23/2007  . HYPOTHYROIDISM 08/23/2007  . HAND PAIN 04/09/2010  . GERD 08/23/2007  . 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  . DIVERTICULOSIS OF COLON 02/22/2008  . DIABETES MELLITUS, BORDERLINE 08/21/2008  . DEGENERATIVE JOINT DISEASE 02/22/2008  . BRONCHITIS, RECURRENT 02/22/2008  . BACK PAIN, LUMBAR 08/23/2007  . ALLERGIC RHINITIS 08/23/2007  . Asthma 01/14/2011  . 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 History of chicken pox 01/14/2011  . History of measles 01/14/2011  . PONV (postoperative nausea and vomiting)   . Peripheral neuropathy 02/12/2011    possibly related to spondylolisthesis per pt  . Urinary frequency   . Cough     usually not productive  . Incontinence of urine   . Obstructive sleep apnea     does not use CPAP, lost weight  . SLEEP APNEA, OBSTRUCTIVE 08/23/2007  . Hypertension   . HYPERTENSION 02/22/2008  . Anemia 09/21/2011  . UTI (lower urinary tract infection) 03/23/2012  . Atrophic vaginitis 06/17/2012    Past Surgical History  Procedure Date  . Appendectomy   . Tonsillectomy and adenoidectomy   . Ap repair and sling for cystocele 8-04     Dr Patsi Sears  . Left hand 2010    left thumb, joint removal, CTR  . Ctr     bilateral  . Shoulder arthroscopy distal clavicle excision and open rotator cuff repair  right  . Neck surgery     x 2 - discectomy, fusion  . Abdominal hysterectomy     took one ovary  . Cholecystectomy   . Carpal tunel 2008    Family History  Problem Relation Age of Onset  . COPD Mother   . Pneumonia Mother   . Other Mother     CHF  . Hypertension Mother   . Arthritis Mother     septic knee after replacement  . Heart disease Mother     chf  . Dementia Father   . Arthritis Brother   . Asthma Daughter   . Cancer Maternal Aunt     breast  . Stroke Maternal Grandmother   . Heart disease Maternal Grandmother   . Other Maternal Grandfather     CHF  . Heart disease Maternal Grandfather     chf  . Cancer  Paternal Grandmother     stomach?  . Other Paternal Grandfather     problems with kidneys  . Kidney disease Paternal Grandfather   . Anxiety disorder Daughter   . Cancer Maternal Aunt     liver    History   Social History  . Marital Status: Widowed    Spouse Name: N/A    Number of Children: N/A  . Years of Education: N/A   Occupational History  . Not on file.   Social History Main Topics  . Smoking status: Former Smoker -- 1.5 packs/day for 30 years    Types: Cigarettes    Quit date: 10/13/1991  . Smokeless tobacco: Never Used  . Alcohol Use: No  . Drug Use: No  . Sexually Active: No   Other Topics Concern  . Not on file   Social History Narrative  . No narrative on file    Current Outpatient Prescriptions on File Prior to Visit  Medication Sig Dispense Refill  . albuterol (PROVENTIL HFA;VENTOLIN HFA) 108 (90 BASE) MCG/ACT inhaler Inhale 2 puffs into the lungs every 6 (six) hours as needed. For wheezing  1 Inhaler  6  . ALPRAZolam (XANAX) 0.5 MG tablet Take 1 tablet (0.5 mg total) by mouth daily as needed for sleep or anxiety. For anxiety  40 tablet  1  . aspirin EC 81 MG tablet Take 81 mg by mouth daily.        . calcium carbonate (OS-CAL) 600 MG TABS Take 600 mg by mouth 2 (two) times daily with a meal.       . cetirizine (ZYRTEC) 10 MG tablet Take 10 mg by mouth daily.      . Cholecalciferol (VITAMIN D) 1000 UNITS capsule Take 1,000 Units by mouth daily.       . clotrimazole-betamethasone (LOTRISONE) cream Apply 1 application topically 2 (two) times daily.  45 g  0  . furosemide (LASIX) 40 MG tablet Take 40 mg by mouth daily as needed. For swelling        . guaiFENesin (MUCINEX) 600 MG 12 hr tablet Take 1,200 mg by mouth 2 (two) times daily as needed.       . hydrOXYzine (ATARAX/VISTARIL) 25 MG tablet Take 25 mg by mouth 3 (three) times daily as needed.      Marland Kitchen levothyroxine (SYNTHROID, LEVOTHROID) 125 MCG tablet Take 1 tablet (125 mcg total) by mouth daily. Skip  saturday  26 tablet  3  . lisinopril (PRINIVIL,ZESTRIL) 5 MG tablet Take 1 tablet (5 mg total) by mouth daily.  90 tablet  3  . ranitidine (  ZANTAC) 150 MG tablet Take 150 mg by mouth daily.      . budesonide-formoterol (SYMBICORT) 160-4.5 MCG/ACT inhaler Inhale 1 puff into the lungs 2 (two) times daily.        . citalopram (CELEXA) 10 MG tablet Take 1 tablet (10 mg total) by mouth daily.  30 tablet  1  . cyclobenzaprine (FLEXERIL) 10 MG tablet Take 10 mg by mouth 3 (three) times daily as needed.      . fluconazole (DIFLUCAN) 150 MG tablet Take 1 tablet (150 mg total) by mouth once. X 2 weeks  2 tablet  1  . [DISCONTINUED] esomeprazole (NEXIUM) 40 MG capsule Take 40 mg by mouth daily before breakfast.          Allergies  Allergen Reactions  . Codeine     REACTION: nausea  . Prednisone     "I forget who I am"  Medrol is ok    Review of Systems  Review of Systems  Constitutional: Negative for fever and malaise/fatigue.  HENT: Negative for congestion.   Eyes: Negative for discharge.  Respiratory: Positive for shortness of breath.   Cardiovascular: Positive for leg swelling. Negative for chest pain and palpitations.  Gastrointestinal: Negative for nausea, abdominal pain and diarrhea.  Genitourinary: Negative for dysuria.  Musculoskeletal: Positive for back pain. Negative for falls.  Skin: Negative for rash.       Very dry scaly skin on feet b/l  Neurological: Positive for sensory change. Negative for loss of consciousness and headaches.  Endo/Heme/Allergies: Negative for polydipsia.  Psychiatric/Behavioral: Positive for depression. Negative for suicidal ideas. The patient is nervous/anxious. The patient does not have insomnia.     Objective  BP 149/89  Pulse 74  Temp 97.6 F (36.4 C) (Temporal)  Ht 5\' 5"  (1.651 m)  Wt 218 lb 12.8 oz (99.247 kg)  BMI 36.41 kg/m2  SpO2 96%  Physical Exam  Physical Exam  Constitutional: She is oriented to person, place, and time and  well-developed, well-nourished, and in no distress. No distress.  HENT:  Head: Normocephalic and atraumatic.  Right Ear: External ear normal.  Left Ear: External ear normal.  Nose: Nose normal.  Mouth/Throat: Oropharynx is clear and moist. No oropharyngeal exudate.  Eyes: Conjunctivae normal are normal. Pupils are equal, round, and reactive to light. Right eye exhibits no discharge. Left eye exhibits no discharge. No scleral icterus.  Neck: Normal range of motion. Neck supple. No thyromegaly present.  Cardiovascular: Normal rate, regular rhythm, normal heart sounds and intact distal pulses.   No murmur heard. Pulmonary/Chest: Effort normal and breath sounds normal. No respiratory distress. She has no wheezes. She has no rales.  Abdominal: Soft. Bowel sounds are normal. She exhibits no distension and no mass. There is no tenderness.  Genitourinary: Vagina normal, right adnexa normal and left adnexa normal. No vaginal discharge found.       Mucosa irregular and erythematous to left of clitoris   Musculoskeletal: Normal range of motion. She exhibits no edema and no tenderness.  Lymphadenopathy:    She has no cervical adenopathy.  Neurological: She is alert and oriented to person, place, and time. She has normal reflexes. No cranial nerve deficit. Coordination normal.  Skin: Skin is warm and dry. No rash noted. She is not diaphoretic.  Psychiatric: Mood, memory and affect normal.    Lab Results  Component Value Date   TSH 0.18* 09/26/2012   Lab Results  Component Value Date   WBC 6.9 09/26/2012   HGB  14.1 09/26/2012   HCT 42.0 09/26/2012   MCV 89.0 09/26/2012   PLT 288.0 09/26/2012   Lab Results  Component Value Date   CREATININE 0.9 09/26/2012   BUN 15 09/26/2012   NA 139 09/26/2012   K 4.5 09/26/2012   CL 102 09/26/2012   CO2 30 09/26/2012   Lab Results  Component Value Date   ALT 12 09/26/2012   AST 15 09/26/2012   ALKPHOS 73 09/26/2012   BILITOT 0.5 09/26/2012   Lab  Results  Component Value Date   CHOL 119 09/26/2012   Lab Results  Component Value Date   HDL 33.60* 09/26/2012   Lab Results  Component Value Date   LDLCALC 57 09/26/2012   Lab Results  Component Value Date   TRIG 144.0 09/26/2012   Lab Results  Component Value Date   CHOLHDL 4 09/26/2012     Assessment & Plan  Atrophic vaginitis Pap done today,continues to have a lesion on left vulva that is itching. Does have an irritated lesion on left front near clitoris started on Premarin cream small amount to lesion then will make an appt with her GYN, Dr Vincente Poli  Anemia Resolved with recent lab work  Asthma Adequately controlled on Symbicort 160/4.5 2 puffs po bid, given some samples today  DIABETES MELLITUS, BORDERLINE hgba1c 6.1, minimize simple carbs.  HYPERTENSION Adequately controlled all things considered will reevaluate next month after we have addressed her depression and anxiety  Peripheral neuropathy Persistent symptoms both feet, decreased sensation  HYPOTHYROIDISM Slightly over treated will decrease her Synthroid to 6 days a week and monitor tsh

## 2012-09-30 NOTE — Assessment & Plan Note (Signed)
Adequately controlled all things considered will reevaluate next month after we have addressed her depression and anxiety

## 2012-09-30 NOTE — Assessment & Plan Note (Signed)
Persistent symptoms both feet, decreased sensation

## 2012-09-30 NOTE — Assessment & Plan Note (Signed)
Slightly over treated will decrease her Synthroid to 6 days a week and monitor tsh

## 2012-09-30 NOTE — Assessment & Plan Note (Signed)
Resolved with recent labwork 

## 2012-09-30 NOTE — Assessment & Plan Note (Signed)
Adequately controlled on Symbicort 160/4.5 2 puffs po bid, given some samples today

## 2012-09-30 NOTE — Assessment & Plan Note (Signed)
hgba1c 6.1, minimize simple carbs.

## 2012-09-30 NOTE — Patient Instructions (Addendum)

## 2012-10-07 ENCOUNTER — Encounter: Payer: Medicare Other | Admitting: Family Medicine

## 2012-10-11 ENCOUNTER — Other Ambulatory Visit: Payer: Medicare Other

## 2012-10-18 ENCOUNTER — Encounter: Payer: Medicare Other | Admitting: Family Medicine

## 2012-11-02 ENCOUNTER — Other Ambulatory Visit: Payer: Self-pay

## 2012-11-02 DIAGNOSIS — I10 Essential (primary) hypertension: Secondary | ICD-10-CM

## 2012-11-02 MED ORDER — LISINOPRIL 5 MG PO TABS: 5.0000 mg | ORAL_TABLET | Freq: Every day | ORAL | Status: DC

## 2012-11-11 ENCOUNTER — Ambulatory Visit: Payer: Medicare Other | Admitting: Family Medicine

## 2013-01-26 ENCOUNTER — Telehealth: Payer: Self-pay | Admitting: Family Medicine

## 2013-01-26 DIAGNOSIS — E039 Hypothyroidism, unspecified: Secondary | ICD-10-CM

## 2013-01-26 MED ORDER — LEVOTHYROXINE SODIUM 125 MCG PO TABS: 125.0000 ug | ORAL_TABLET | Freq: Every day | ORAL | Status: DC

## 2013-01-26 NOTE — Telephone Encounter (Signed)
Refill- levothyroxine 0.125mg (170mcg)tab. Take one tablet by mouth daily every day but Saturday. Qty 26 last fill 3.26.14

## 2013-01-26 NOTE — Telephone Encounter (Signed)
Rx request to pharmacy 30-day given; *PATIENT DUE FOR FOLLOW-UP OFFICE VISIT* [canceled 01.2014 appt]/SLS

## 2013-02-08 ENCOUNTER — Encounter: Payer: Self-pay | Admitting: Family Medicine

## 2013-02-08 ENCOUNTER — Ambulatory Visit (INDEPENDENT_AMBULATORY_CARE_PROVIDER_SITE_OTHER): Payer: Medicare Other | Admitting: Family Medicine

## 2013-02-08 VITALS — BP 120/76 | HR 80 | Temp 98.0°F | Ht 65.0 in | Wt 219.8 lb

## 2013-02-08 DIAGNOSIS — F329 Major depressive disorder, single episode, unspecified: Secondary | ICD-10-CM

## 2013-02-08 DIAGNOSIS — J45909 Unspecified asthma, uncomplicated: Secondary | ICD-10-CM

## 2013-02-08 DIAGNOSIS — I10 Essential (primary) hypertension: Secondary | ICD-10-CM

## 2013-02-08 DIAGNOSIS — F341 Dysthymic disorder: Secondary | ICD-10-CM

## 2013-02-08 DIAGNOSIS — B379 Candidiasis, unspecified: Secondary | ICD-10-CM

## 2013-02-08 DIAGNOSIS — F32A Depression, unspecified: Secondary | ICD-10-CM

## 2013-02-08 DIAGNOSIS — E039 Hypothyroidism, unspecified: Secondary | ICD-10-CM

## 2013-02-08 DIAGNOSIS — E785 Hyperlipidemia, unspecified: Secondary | ICD-10-CM

## 2013-02-08 DIAGNOSIS — E669 Obesity, unspecified: Secondary | ICD-10-CM

## 2013-02-08 LAB — LIPID PANEL
Cholesterol: 117 mg/dL (ref 0–200)
HDL: 36.7 mg/dL — ABNORMAL LOW (ref >39.00)
LDL Cholesterol: 56 mg/dL (ref 0–99)
Total CHOL/HDL Ratio: 3
Triglycerides: 120 mg/dL (ref 0.0–149.0)
VLDL: 24 mg/dL (ref 0.0–40.0)

## 2013-02-08 LAB — HEPATIC FUNCTION PANEL
ALT: 13 U/L (ref 0–35)
AST: 18 U/L (ref 0–37)
Albumin: 3.8 g/dL (ref 3.5–5.2)
Alkaline Phosphatase: 69 U/L (ref 39–117)
Bilirubin, Direct: 0 mg/dL (ref 0.0–0.3)
Total Bilirubin: 0.7 mg/dL (ref 0.3–1.2)
Total Protein: 7.1 g/dL (ref 6.0–8.3)

## 2013-02-08 LAB — RENAL FUNCTION PANEL
Albumin: 3.8 g/dL (ref 3.5–5.2)
BUN: 17 mg/dL (ref 6–23)
CO2: 29 mEq/L (ref 19–32)
Calcium: 9.3 mg/dL (ref 8.4–10.5)
Chloride: 104 mEq/L (ref 96–112)
Creatinine, Ser: 1 mg/dL (ref 0.4–1.2)
GFR: 60.34 mL/min (ref >60.00)
Glucose, Bld: 99 mg/dL (ref 70–99)
Phosphorus: 2.9 mg/dL (ref 2.3–4.6)
Potassium: 4.7 mEq/L (ref 3.5–5.1)
Sodium: 139 mEq/L (ref 135–145)

## 2013-02-08 LAB — TSH: TSH: 0.04 u[IU]/mL — ABNORMAL LOW (ref 0.35–5.50)

## 2013-02-08 LAB — CBC
HCT: 43.3 % (ref 36.0–46.0)
Hemoglobin: 14.5 g/dL (ref 12.0–15.0)
MCHC: 33.5 g/dL (ref 30.0–36.0)
MCV: 88.6 fl (ref 78.0–100.0)
Platelets: 268 10*3/uL (ref 150.0–400.0)
RBC: 4.89 Mil/uL (ref 3.87–5.11)
RDW: 13.4 % (ref 11.5–14.6)
WBC: 10.3 10*3/uL (ref 4.5–10.5)

## 2013-02-08 LAB — T4, FREE: Free T4: 1.05 ng/dL (ref 0.60–1.60)

## 2013-02-08 MED ORDER — LEVOTHYROXINE SODIUM 112 MCG PO TABS: 112.0000 ug | ORAL_TABLET | Freq: Every day | ORAL | Status: DC

## 2013-02-08 MED ORDER — FLUCONAZOLE 150 MG PO TABS: 150.0000 mg | ORAL_TABLET | Freq: Once | ORAL | Status: DC

## 2013-02-08 MED ORDER — ALBUTEROL SULFATE HFA 108 (90 BASE) MCG/ACT IN AERS: 2.0000 | INHALATION_SPRAY | Freq: Four times a day (QID) | RESPIRATORY_TRACT | Status: DC | PRN

## 2013-02-08 MED ORDER — CITALOPRAM HYDROBROMIDE 10 MG PO TABS: 10.0000 mg | ORAL_TABLET | Freq: Every day | ORAL | Status: DC

## 2013-02-08 NOTE — Patient Instructions (Addendum)

## 2013-02-11 NOTE — Assessment & Plan Note (Signed)
Encouraged DASH diet, increase exercise 

## 2013-02-11 NOTE — Assessment & Plan Note (Signed)
No recent flares 

## 2013-02-11 NOTE — Assessment & Plan Note (Signed)
Well controlled, no changes 

## 2013-02-11 NOTE — Progress Notes (Signed)
Patient ID: Jenny [name], female   DOB: June 26, 1941, 72 y.o.   MRN: [id number] Jenny [name] [id number] 29-Sep-1941 02/11/2013      Progress Note-Follow Up  Subjective  Chief Complaint  Chief Complaint  Patient presents with  . Follow-up    discuss thyroid medication    HPI  Patient is a 72 year old Caucasian female who is in today for followup. Overall she well. Pain in her legs with paresthesias. She's had a vascular workup and her legs are clear. She reports only 3% blockage. She's been following with GYN and Premarin cream has been helpful. No other acute complaints. No chest pain or palpitations. Shortness or breath, fevers, GI or GU complaints.  Past Medical History  Diagnosis Date  . Allergy     rhinitis  . Bronchitis   . Venous insufficiency   . Obesity   . Hypothyroidism   . GERD (gastroesophageal reflux disease)   . Diverticulosis of colon   . Urinary incontinence   . Degenerative joint disease   . Lumbar back pain   . Vitamin D deficiency   . Dysthymia   . Asthma     mild, intermittent  . Transient memory loss March 2006    after a stressful event she had no memory the rest of that day  . VITAMIN D DEFICIENCY 08/21/2008  . VENOUS INSUFFICIENCY 08/23/2007  . Other urinary incontinence 03/12/2010  . OBESITY 08/23/2007  . HYPOTHYROIDISM 08/23/2007  . HAND PAIN 04/09/2010  . GERD 08/23/2007  . 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  . DIVERTICULOSIS OF COLON 02/22/2008  . DIABETES MELLITUS, BORDERLINE 08/21/2008  . DEGENERATIVE JOINT DISEASE 02/22/2008  . BRONCHITIS, RECURRENT 02/22/2008  . BACK PAIN, LUMBAR 08/23/2007  . ALLERGIC RHINITIS 08/23/2007  . Asthma 01/14/2011  . 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 History of chicken pox 01/14/2011  . History of measles 01/14/2011  . PONV (postoperative nausea and vomiting)   . Peripheral neuropathy 02/12/2011    possibly related to spondylolisthesis per pt  . Urinary frequency   . Cough     usually not productive  . Incontinence of  urine   . Obstructive sleep apnea     does not use CPAP, lost weight  . SLEEP APNEA, OBSTRUCTIVE 08/23/2007  . Hypertension   . HYPERTENSION 02/22/2008  . Anemia 09/21/2011  . UTI (lower urinary tract infection) 03/23/2012  . Atrophic vaginitis 06/17/2012    Past Surgical History  Procedure Laterality Date  . Appendectomy    . Tonsillectomy and adenoidectomy    . Ap repair and sling for cystocele  8-04     Dr Patsi Sears  . Left hand  2010    left thumb, joint removal, CTR  . Ctr      bilateral  . Shoulder arthroscopy distal clavicle excision and open rotator cuff repair      right  . Neck surgery      x 2 - discectomy, fusion  . Abdominal hysterectomy      took one ovary  . Cholecystectomy    . Carpal tunel  2008    Family History  Problem Relation Age of Onset  . COPD Mother   . Pneumonia Mother   . Other Mother     CHF  . Hypertension Mother   . Arthritis Mother     septic knee after replacement  . Heart disease Mother     chf  . Dementia Father   . Arthritis Brother   . Asthma Daughter   .  Cancer Maternal Aunt     breast  . Stroke Maternal Grandmother   . Heart disease Maternal Grandmother   . Other Maternal Grandfather     CHF  . Heart disease Maternal Grandfather     chf  . Cancer Paternal Grandmother     stomach?  . Other Paternal Grandfather     problems with kidneys  . Kidney disease Paternal Grandfather   . Anxiety disorder Daughter   . Cancer Maternal Aunt     liver    History   Social History  . Marital Status: Widowed    Spouse Name: N/A    Number of Children: N/A  . Years of Education: N/A   Occupational History  . Not on file.   Social History Main Topics  . Smoking status: Former Smoker -- 1.50 packs/day for 30 years    Types: Cigarettes    Quit date: 10/13/1991  . Smokeless tobacco: Never Used  . Alcohol Use: No  . Drug Use: No  . Sexually Active: No   Other Topics Concern  . Not on file   Social History Narrative  .  No narrative on file    Current Outpatient Prescriptions on File Prior to Visit  Medication Sig Dispense Refill  . ALPRAZolam (XANAX) 0.25 MG tablet Take 1 tablet (0.25 mg total) by mouth 2 (two) times daily as needed for sleep or anxiety.  40 tablet  1  . ALPRAZolam (XANAX) 0.5 MG tablet Take 1 tablet (0.5 mg total) by mouth daily as needed for sleep or anxiety. For anxiety  40 tablet  1  . aspirin EC 81 MG tablet Take 81 mg by mouth daily.        . budesonide-formoterol (SYMBICORT) 160-4.5 MCG/ACT inhaler Inhale 1 puff into the lungs 2 (two) times daily.        . calcium carbonate (OS-CAL) 600 MG TABS Take 600 mg by mouth 2 (two) times daily with a meal.       . Cholecalciferol (VITAMIN D) 1000 UNITS capsule Take 1,000 Units by mouth daily.       . clotrimazole-betamethasone (LOTRISONE) cream Apply 1 application topically 2 (two) times daily.  45 g  0  . furosemide (LASIX) 40 MG tablet Take 40 mg by mouth daily as needed. For swelling        . hydrOXYzine (ATARAX/VISTARIL) 25 MG tablet Take 25 mg by mouth 3 (three) times daily as needed.      Marland Kitchen levothyroxine (SYNTHROID, LEVOTHROID) 125 MCG tablet Take 1 tablet (125 mcg total) by mouth daily. Except [skip] Saturday  26 tablet  0  . lisinopril (PRINIVIL,ZESTRIL) 5 MG tablet Take 1 tablet (5 mg total) by mouth daily.  90 tablet  1  . ranitidine (ZANTAC) 150 MG tablet Take 150 mg by mouth daily.      Marland Kitchen guaiFENesin (MUCINEX) 600 MG 12 hr tablet Take 1,200 mg by mouth 2 (two) times daily as needed.       . [DISCONTINUED] esomeprazole (NEXIUM) 40 MG capsule Take 40 mg by mouth daily before breakfast.         No current facility-administered medications on file prior to visit.    Allergies  Allergen Reactions  . Codeine     REACTION: nausea  . Prednisone     "I forget who I am"  Medrol is ok    Review of Systems  Review of Systems  Constitutional: Negative for fever and malaise/fatigue.  HENT: Negative for congestion.  Eyes:  Negative for discharge.  Respiratory: Negative for shortness of breath.   Cardiovascular: Negative for chest pain, palpitations and leg swelling.  Gastrointestinal: Negative for nausea, abdominal pain and diarrhea.  Genitourinary: Negative for dysuria.  Musculoskeletal: Negative for falls.  Skin: Negative for rash.  Neurological: Negative for loss of consciousness and headaches.  Endo/Heme/Allergies: Negative for polydipsia.  Psychiatric/Behavioral: Negative for depression and suicidal ideas. The patient is not nervous/anxious and does not have insomnia.     Objective  BP 120/76  Pulse 80  Temp(Src) 98 F (36.7 C) (Temporal)  Ht 5\' 5"  (1.651 m)  Wt 219 lb 12.8 oz (99.701 kg)  BMI 36.58 kg/m2  SpO2 95%  Physical Exam  Physical Exam  Constitutional: She is oriented to person, place, and time and well-developed, well-nourished, and in no distress. No distress.  HENT:  Head: Normocephalic and atraumatic.  Eyes: Conjunctivae are normal.  Neck: Neck supple. No thyromegaly present.  Cardiovascular: Normal rate, regular rhythm and normal heart sounds.   Pulmonary/Chest: Effort normal and breath sounds normal. She has no wheezes.  Abdominal: She exhibits no distension and no mass.  Musculoskeletal: She exhibits no edema.  Lymphadenopathy:    She has no cervical adenopathy.  Neurological: She is alert and oriented to person, place, and time.  Skin: Skin is warm and dry. No rash noted. She is not diaphoretic.  Psychiatric: Memory, affect and judgment normal.    Lab Results  Component Value Date   TSH 0.04* 02/08/2013   Lab Results  Component Value Date   WBC 10.3 02/08/2013   HGB 14.5 02/08/2013   HCT 43.3 02/08/2013   MCV 88.6 02/08/2013   PLT 268.0 02/08/2013   Lab Results  Component Value Date   CREATININE 1.0 02/08/2013   BUN 17 02/08/2013   NA 139 02/08/2013   K 4.7 02/08/2013   CL 104 02/08/2013   CO2 29 02/08/2013   Lab Results  Component Value Date   ALT 13 02/08/2013    AST 18 02/08/2013   ALKPHOS 69 02/08/2013   BILITOT 0.7 02/08/2013   Lab Results  Component Value Date   CHOL 117 02/08/2013   Lab Results  Component Value Date   HDL 36.70* 02/08/2013   Lab Results  Component Value Date   LDLCALC 56 02/08/2013   Lab Results  Component Value Date   TRIG 120.0 02/08/2013   Lab Results  Component Value Date   CHOLHDL 3 02/08/2013     Assessment & Plan  HYPERTENSION Well controlled, no changes.   Asthma No recent flares  HYPOTHYROIDISM overtreated will drop synthroid dose and try DAW  OBESITY Encouraged DASH diet, increase exercise.

## 2013-02-11 NOTE — Assessment & Plan Note (Signed)
overtreated will drop synthroid dose and try DAW

## 2013-05-05 ENCOUNTER — Other Ambulatory Visit: Payer: Self-pay | Admitting: *Deleted

## 2013-05-05 DIAGNOSIS — I10 Essential (primary) hypertension: Secondary | ICD-10-CM

## 2013-05-05 MED ORDER — LISINOPRIL 5 MG PO TABS
5.0000 mg | ORAL_TABLET | Freq: Every day | ORAL | Status: DC
Start: 2013-05-05 — End: 2013-11-03

## 2013-05-05 NOTE — Telephone Encounter (Signed)
Rx request to pharmacy/SLS  

## 2013-08-01 ENCOUNTER — Other Ambulatory Visit: Payer: Self-pay

## 2013-08-01 NOTE — Telephone Encounter (Signed)
Pt is coughing and would like to know if she can have her Medrol refilled.  Pt went ahead and made an appt for Thursday for physical

## 2013-08-03 ENCOUNTER — Ambulatory Visit (INDEPENDENT_AMBULATORY_CARE_PROVIDER_SITE_OTHER): Payer: Medicare Other | Admitting: Family Medicine

## 2013-08-03 ENCOUNTER — Other Ambulatory Visit: Payer: Self-pay | Admitting: Family Medicine

## 2013-08-03 ENCOUNTER — Telehealth: Payer: Self-pay | Admitting: Family Medicine

## 2013-08-03 ENCOUNTER — Encounter: Payer: Self-pay | Admitting: Family Medicine

## 2013-08-03 VITALS — BP 110/78 | HR 72 | Temp 97.7°F | Ht 65.0 in | Wt 218.0 lb

## 2013-08-03 DIAGNOSIS — I10 Essential (primary) hypertension: Secondary | ICD-10-CM

## 2013-08-03 DIAGNOSIS — J441 Chronic obstructive pulmonary disease with (acute) exacerbation: Secondary | ICD-10-CM

## 2013-08-03 DIAGNOSIS — E039 Hypothyroidism, unspecified: Secondary | ICD-10-CM

## 2013-08-03 DIAGNOSIS — R739 Hyperglycemia, unspecified: Secondary | ICD-10-CM

## 2013-08-03 DIAGNOSIS — G4733 Obstructive sleep apnea (adult) (pediatric): Secondary | ICD-10-CM

## 2013-08-03 DIAGNOSIS — G609 Hereditary and idiopathic neuropathy, unspecified: Secondary | ICD-10-CM

## 2013-08-03 DIAGNOSIS — J42 Unspecified chronic bronchitis: Secondary | ICD-10-CM

## 2013-08-03 DIAGNOSIS — D649 Anemia, unspecified: Secondary | ICD-10-CM

## 2013-08-03 DIAGNOSIS — J45909 Unspecified asthma, uncomplicated: Secondary | ICD-10-CM

## 2013-08-03 DIAGNOSIS — R7309 Other abnormal glucose: Secondary | ICD-10-CM

## 2013-08-03 DIAGNOSIS — Z0000 Encounter for general adult medical examination without abnormal findings: Secondary | ICD-10-CM

## 2013-08-03 LAB — CBC
HCT: 44.6 % (ref 36.0–46.0)
Hemoglobin: 15.1 g/dL — ABNORMAL HIGH (ref 12.0–15.0)
MCH: 29.6 pg (ref 26.0–34.0)
MCHC: 33.9 g/dL (ref 30.0–36.0)
MCV: 87.5 fL (ref 78.0–100.0)
Platelets: 293 10*3/uL (ref 150–400)
RBC: 5.1 MIL/uL (ref 3.87–5.11)
RDW: 13.7 % (ref 11.5–15.5)
WBC: 8.1 10*3/uL (ref 4.0–10.5)

## 2013-08-03 LAB — RENAL FUNCTION PANEL
Albumin: 4.1 g/dL (ref 3.5–5.2)
BUN: 17 mg/dL (ref 6–23)
CO2: 28 meq/L (ref 19–32)
Calcium: 9.7 mg/dL (ref 8.4–10.5)
Chloride: 105 mEq/L (ref 96–112)
Creat: 0.83 mg/dL (ref 0.50–1.10)
Glucose, Bld: 95 mg/dL (ref 70–99)
Phosphorus: 3.3 mg/dL (ref 2.3–4.6)
Potassium: 5.2 meq/L (ref 3.5–5.3)
Sodium: 142 meq/L (ref 135–145)

## 2013-08-03 LAB — HEPATIC FUNCTION PANEL
ALT: 10 U/L (ref 0–35)
AST: 14 U/L (ref 0–37)
Albumin: 4.1 g/dL (ref 3.5–5.2)
Alkaline Phosphatase: 78 U/L (ref 39–117)
Bilirubin, Direct: 0.2 mg/dL (ref 0.0–0.3)
Indirect Bilirubin: 0.4 mg/dL (ref 0.0–0.9)
Total Bilirubin: 0.6 mg/dL (ref 0.3–1.2)
Total Protein: 7.2 g/dL (ref 6.0–8.3)

## 2013-08-03 LAB — TSH: TSH: 0.058 u[IU]/mL — ABNORMAL LOW (ref 0.350–4.500)

## 2013-08-03 LAB — LIPID PANEL
Cholesterol: 121 mg/dL (ref 0–200)
HDL: 38 mg/dL — ABNORMAL LOW (ref >39)
LDL Cholesterol: 60 mg/dL (ref 0–99)
Total CHOL/HDL Ratio: 3.2 {ratio}
Triglycerides: 116 mg/dL (ref <150)
VLDL: 23 mg/dL (ref 0–40)

## 2013-08-03 LAB — HEMOGLOBIN A1C
Hgb A1c MFr Bld: 5.9 % — ABNORMAL HIGH (ref <5.7)
Mean Plasma Glucose: 123 mg/dL — ABNORMAL HIGH (ref <117)

## 2013-08-03 LAB — T4, FREE: Free T4: 1.45 ng/dL (ref 0.80–1.80)

## 2013-08-03 MED ORDER — METHYLPREDNISOLONE (PAK) 4 MG PO TABS: ORAL_TABLET | ORAL | Status: DC

## 2013-08-03 MED ORDER — ALBUTEROL SULFATE HFA 108 (90 BASE) MCG/ACT IN AERS: 2.0000 | INHALATION_SPRAY | Freq: Four times a day (QID) | RESPIRATORY_TRACT | Status: DC | PRN

## 2013-08-03 MED ORDER — BUDESONIDE-FORMOTEROL FUMARATE 160-4.5 MCG/ACT IN AERO: 2.0000 | INHALATION_SPRAY | Freq: Two times a day (BID) | RESPIRATORY_TRACT | Status: DC

## 2013-08-03 MED ORDER — GABAPENTIN 300 MG PO CAPS: 300.0000 mg | ORAL_CAPSULE | Freq: Three times a day (TID) | ORAL | Status: DC

## 2013-08-03 MED ORDER — CEFDINIR 300 MG PO CAPS: 300.0000 mg | ORAL_CAPSULE | Freq: Two times a day (BID) | ORAL | Status: AC

## 2013-08-03 NOTE — Telephone Encounter (Signed)
LAB ORDER WEEK OF 10-25-2013 Labs prior or at next lipid, renal, cbc, tsh, hepatic, hgba1c

## 2013-08-03 NOTE — Patient Instructions (Signed)
Mucinex twice a day x 10 days, probiotic daily, zinc such as Coldeeze or Calcium, magnesium, zinc tabs daily Hydrate     Acute Bronchitis You have acute bronchitis. This means you have a chest cold. The airways in your lungs are red and sore (inflamed). Acute means it is sudden onset.  CAUSES Bronchitis is most often caused by the same virus that causes a cold. SYMPTOMS   Body aches.  Chest congestion.  Chills.  Cough.  Fever.  Shortness of breath.  Sore throat. TREATMENT  Acute bronchitis is usually treated with rest, fluids, and medicines for relief of fever or cough. Most symptoms should go away after a few days or a week. Increased fluids may help thin your secretions and will prevent dehydration. Your caregiver may give you an inhaler to improve your symptoms. The inhaler reduces shortness of breath and helps control cough. You can take over-the-counter pain relievers or cough medicine to decrease coughing, pain, or fever. A cool-air vaporizer may help thin bronchial secretions and make it easier to clear your chest. Antibiotics are usually not needed but can be prescribed if you smoke, are seriously ill, have chronic lung problems, are elderly, or you are at higher risk for developing complications.Allergies and asthma can make bronchitis worse. Repeated episodes of bronchitis may cause longstanding lung problems. Avoid smoking and secondhand smoke.Exposure to cigarette smoke or irritating chemicals will make bronchitis worse. If you are a cigarette smoker, consider using nicotine gum or skin patches to help control withdrawal symptoms. Quitting smoking will help your lungs heal faster. Recovery from bronchitis is often slow, but you should start feeling better after 2 to 3 days. Cough from bronchitis frequently lasts for 3 to 4 weeks. To prevent another bout of acute bronchitis:  Quit smoking.  Wash your hands frequently to get rid of viruses or use a hand  sanitizer.  Avoid other people with cold or virus symptoms.  Try not to touch your hands to your mouth, nose, or eyes. SEEK IMMEDIATE MEDICAL CARE IF:  You develop increased fever, chills, or chest pain.  You have severe shortness of breath or bloody sputum.  You develop dehydration, fainting, repeated vomiting, or a severe headache.  You have no improvement after 1 week of treatment or you get worse. MAKE SURE YOU:   Understand these instructions.  Will watch your condition.  Will get help right away if you are not doing well or get worse. Document Released: 11/05/2004 Document Revised: 12/21/2011 Document Reviewed: 01/21/2011 [facility] Patient Information 2014 [place], [place].

## 2013-08-03 NOTE — Progress Notes (Signed)
Patient ID: Jenny [name], female   DOB: [birth year], 72 y.o.   MRN: [id number] Jenny [name] [id number] 01/16/1941 08/03/2013      Progress Note-Follow Up  Subjective  Chief Complaint  Chief Complaint  Patient presents with  . Annual Exam    physical  . Cough    w/ phlegm- brown - pt took a zpak and Tuesday was her last pill    HPI  Patient is a 72 year old Caucasian female is in today for annual exam. He is struggling with persistent chest congestion despite a recent course of azithromycin. His fatigue and malaise postnasal drip and head congestion as well. She's coughing to the point chest pain. No obvious fevers chills headaches. Uses her CPAP routinely. Recently had a Pap with her gynecologist. No palpitations or shortness of breath. No GI or GU complaints.  Past Medical History  Diagnosis Date  . Allergy     rhinitis  . Bronchitis   . Venous insufficiency   . Obesity   . Hypothyroidism   . GERD (gastroesophageal reflux disease)   . Diverticulosis of colon   . Urinary incontinence   . Degenerative joint disease   . Lumbar back pain   . Vitamin D deficiency   . Dysthymia   . Asthma     mild, intermittent  . Transient memory loss March 2006    after a stressful event she had no memory the rest of that day  . VITAMIN D DEFICIENCY 08/21/2008  . VENOUS INSUFFICIENCY 08/23/2007  . Other urinary incontinence 03/12/2010  . OBESITY 08/23/2007  . HYPOTHYROIDISM 08/23/2007  . HAND PAIN 04/09/2010  . GERD 08/23/2007  . 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  . DIVERTICULOSIS OF COLON 02/22/2008  . DIABETES MELLITUS, BORDERLINE 08/21/2008  . DEGENERATIVE JOINT DISEASE 02/22/2008  . BRONCHITIS, RECURRENT 02/22/2008  . BACK PAIN, LUMBAR 08/23/2007  . ALLERGIC RHINITIS 08/23/2007  . Asthma 01/14/2011  . 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 History of chicken pox 01/14/2011  . History of measles 01/14/2011  . PONV (postoperative nausea and vomiting)   . Peripheral neuropathy 02/12/2011    possibly  related to spondylolisthesis per pt  . Urinary frequency   . Cough     usually not productive  . Incontinence of urine   . Obstructive sleep apnea     does not use CPAP, lost weight  . SLEEP APNEA, OBSTRUCTIVE 08/23/2007  . Hypertension   . HYPERTENSION 02/22/2008  . Anemia 09/21/2011  . UTI (lower urinary tract infection) 03/23/2012  . Atrophic vaginitis 06/17/2012    Past Surgical History  Procedure Laterality Date  . Appendectomy    . Tonsillectomy and adenoidectomy    . Ap repair and sling for cystocele  8-04     Dr Patsi Sears  . Left hand  2010    left thumb, joint removal, [facility]  . Ctr      bilateral  . Shoulder arthroscopy distal clavicle excision and open rotator cuff repair      right  . Neck surgery      x 2 - discectomy, fusion  . Abdominal hysterectomy      took one ovary  . Cholecystectomy    . Carpal tunel  2008    Family History  Problem Relation Age of Onset  . COPD Mother   . Pneumonia Mother   . Other Mother     CHF  . Hypertension Mother   . Arthritis Mother     septic knee after replacement  .  Heart disease Mother     chf  . Dementia Father   . Arthritis Brother   . Asthma Daughter   . Cancer Maternal Aunt     breast  . Stroke Maternal Grandmother   . Heart disease Maternal Grandmother   . Other Maternal Grandfather     CHF  . Heart disease Maternal Grandfather     chf  . Cancer Paternal Grandmother     stomach?  . Other Paternal Grandfather     problems with kidneys  . Kidney disease Paternal Grandfather   . Anxiety disorder Daughter   . Cancer Maternal Aunt     liver    History   Social History  . Marital Status: Widowed    Spouse Name: N/A    Number of Children: N/A  . Years of Education: N/A   Occupational History  . Not on file.   Social History Main Topics  . Smoking status: Former Smoker -- 1.50 packs/day for 30 years    Types: Cigarettes    Quit date: 10/13/1991  . Smokeless tobacco: Never Used  . Alcohol Use: No   . Drug Use: No  . Sexual Activity: No   Other Topics Concern  . Not on file   Social History Narrative  . No narrative on file    Current Outpatient Prescriptions on File Prior to Visit  Medication Sig Dispense Refill  . albuterol (PROVENTIL HFA;VENTOLIN HFA) 108 (90 BASE) MCG/ACT inhaler Inhale 2 puffs into the lungs every 6 (six) hours as needed. For wheezing  1 Inhaler  6  . ALPRAZolam (XANAX) 0.5 MG tablet Take 1 tablet (0.5 mg total) by mouth daily as needed for sleep or anxiety. For anxiety  40 tablet  1  . aspirin EC 81 MG tablet Take 81 mg by mouth daily.        . budesonide-formoterol (SYMBICORT) 160-4.5 MCG/ACT inhaler Inhale 1 puff into the lungs 2 (two) times daily.        . calcium carbonate (OS-CAL) 600 MG TABS Take 600 mg by mouth 2 (two) times daily with a meal.       . Cholecalciferol (VITAMIN D) 1000 UNITS capsule Take 1,000 Units by mouth daily.       . citalopram (CELEXA) 10 MG tablet Take 1 tablet (10 mg total) by mouth daily.  30 tablet  5  . clotrimazole-betamethasone (LOTRISONE) cream Apply 1 application topically 2 (two) times daily.  45 g  0  . furosemide (LASIX) 40 MG tablet Take 40 mg by mouth daily as needed. For swelling        . gabapentin (NEURONTIN) 100 MG capsule Take 100 mg by mouth 3 (three) times daily.      Marland Kitchen guaiFENesin (MUCINEX) 600 MG 12 hr tablet Take 1,200 mg by mouth 2 (two) times daily as needed.       . hydrOXYzine (ATARAX/VISTARIL) 25 MG tablet Take 25 mg by mouth 3 (three) times daily as needed.      Marland Kitchen lisinopril (PRINIVIL,ZESTRIL) 5 MG tablet Take 1 tablet (5 mg total) by mouth daily.  90 tablet  1  . ranitidine (ZANTAC) 150 MG tablet Take 150 mg by mouth daily.      . [DISCONTINUED] esomeprazole (NEXIUM) 40 MG capsule Take 40 mg by mouth daily before breakfast.         No current facility-administered medications on file prior to visit.    Allergies  Allergen Reactions  . Codeine  REACTION: nausea  . Prednisone     "I forget  who I am"  Medrol is ok    Review of Systems  Review of Systems  Constitutional: Negative for fever and malaise/fatigue.  HENT: Positive for congestion.   Eyes: Negative for discharge.  Respiratory: Positive for cough and sputum production. Negative for shortness of breath.   Cardiovascular: Negative for chest pain, palpitations and leg swelling.  Gastrointestinal: Negative for nausea, abdominal pain and diarrhea.  Genitourinary: Negative for dysuria.  Musculoskeletal: Negative for falls.  Skin: Negative for rash.  Neurological: Negative for loss of consciousness and headaches.  Endo/Heme/Allergies: Negative for polydipsia.  Psychiatric/Behavioral: Negative for depression and suicidal ideas. The patient is not nervous/anxious and does not have insomnia.     Objective  BP 110/78  Pulse 72  Temp(Src) 97.7 F (36.5 C) (Oral)  Ht 5\' 5"  (1.651 m)  Wt 218 lb (98.884 kg)  BMI 36.28 kg/m2  SpO2 97%  Physical Exam  Physical Exam  Constitutional: She is oriented to person, place, and time and well-developed, well-nourished, and in no distress. No distress.  HENT:  Head: Normocephalic and atraumatic.  Eyes: Conjunctivae are normal.  Neck: Neck supple. No thyromegaly present.  Cardiovascular: Normal rate, regular rhythm and normal heart sounds.   No murmur heard. Pulmonary/Chest: Effort normal and breath sounds normal. She has no wheezes.  Abdominal: She exhibits no distension and no mass.  Musculoskeletal: She exhibits no edema.  Lymphadenopathy:    She has no cervical adenopathy.  Neurological: She is alert and oriented to person, place, and time.  Skin: Skin is warm and dry. No rash noted. She is not diaphoretic.  Psychiatric: Memory, affect and judgment normal.    Lab Results  Component Value Date   TSH 0.04* 02/08/2013   Lab Results  Component Value Date   WBC 10.3 02/08/2013   HGB 14.5 02/08/2013   HCT 43.3 02/08/2013   MCV 88.6 02/08/2013   PLT 268.0 02/08/2013    Lab Results  Component Value Date   CREATININE 1.0 02/08/2013   BUN 17 02/08/2013   NA 139 02/08/2013   K 4.7 02/08/2013   CL 104 02/08/2013   CO2 29 02/08/2013   Lab Results  Component Value Date   ALT 13 02/08/2013   AST 18 02/08/2013   ALKPHOS 69 02/08/2013   BILITOT 0.7 02/08/2013   Lab Results  Component Value Date   CHOL 117 02/08/2013   Lab Results  Component Value Date   HDL 36.70* 02/08/2013   Lab Results  Component Value Date   LDLCALC 56 02/08/2013   Lab Results  Component Value Date   TRIG 120.0 02/08/2013   Lab Results  Component Value Date   CHOLHDL 3 02/08/2013     Assessment & Plan  Annual physical exam Encouraged heart healthy diet, regular exercise. Denies any recent falls, depression, vision or hearing loss. No advanced directives. Reviewed labs with patient  BRONCHITIS, RECURRENT Recent flare, given rx for Cefdinir, Medrol dosepak. Mucinex, probiotics, inhalers  SLEEP APNEA, OBSTRUCTIVE Using CPAP routinely  HYPOTHYROIDISM Overtreated, drop dose of levothyroxine to 112

## 2013-08-06 ENCOUNTER — Encounter: Payer: Self-pay | Admitting: Family Medicine

## 2013-08-06 DIAGNOSIS — Z0000 Encounter for general adult medical examination without abnormal findings: Secondary | ICD-10-CM | POA: Insufficient documentation

## 2013-08-06 HISTORY — DX: Encounter for general adult medical examination without abnormal findings: Z00.00

## 2013-08-06 NOTE — Assessment & Plan Note (Signed)
Encouraged heart healthy diet, regular exercise. Denies any recent falls, depression, vision or hearing loss. No advanced directives. Reviewed labs with patient

## 2013-08-06 NOTE — Assessment & Plan Note (Signed)
Recent flare, given rx for Cefdinir, Medrol dosepak. Mucinex, probiotics, inhalers

## 2013-08-06 NOTE — Assessment & Plan Note (Signed)
Using CPAP routinely 

## 2013-08-06 NOTE — Assessment & Plan Note (Signed)
Overtreated, drop dose of levothyroxine to 112

## 2013-08-08 ENCOUNTER — Telehealth: Payer: Self-pay

## 2013-08-08 MED ORDER — LEVOTHYROXINE SODIUM 112 MCG PO TABS: 112.0000 ug | ORAL_TABLET | Freq: Every day | ORAL | Status: DC

## 2013-08-08 NOTE — Telephone Encounter (Signed)
Pt informed and voiced understanding.   RX sent to pharmacy

## 2013-08-08 NOTE — Telephone Encounter (Signed)
Message copied by Court Joy on Tue Aug 08, 2013  6:33 PM ------      Message from: Danise Edge A      Created: Sun Aug 06, 2013  4:18 PM       Her last dose of Levothyroxine was 125 mcg daily, needs to drop to 112 mcg daily ------

## 2013-08-08 NOTE — Telephone Encounter (Signed)
Message copied by Court Joy on Tue Aug 08, 2013  6:35 PM ------      Message from: Danise Edge A      Created: Sun Aug 06, 2013  4:18 PM       Her last dose of Levothyroxine was 125 mcg daily, needs to drop to 112 mcg daily ------

## 2013-08-23 ENCOUNTER — Telehealth: Payer: Self-pay | Admitting: *Deleted

## 2013-08-23 NOTE — Telephone Encounter (Signed)
I have called patient on behalf of [facility] closure project) to ask that she have a mammogram since she has not had one over the last several years per our records. Patient states that Dr Abner Greenspan has requested she do this and she has the information to schedule the mammogram but just has not done so yet. She states that she will call tomorrow to get her mammogram scheduled.

## 2013-09-19 LAB — HM MAMMOGRAPHY

## 2013-09-26 ENCOUNTER — Other Ambulatory Visit: Payer: Self-pay | Admitting: Radiology

## 2013-10-19 ENCOUNTER — Encounter: Payer: Self-pay | Admitting: Family Medicine

## 2013-10-23 ENCOUNTER — Encounter: Payer: Self-pay | Admitting: Family Medicine

## 2013-11-02 ENCOUNTER — Ambulatory Visit: Payer: Medicare Other | Admitting: Family Medicine

## 2013-11-03 ENCOUNTER — Ambulatory Visit (INDEPENDENT_AMBULATORY_CARE_PROVIDER_SITE_OTHER): Payer: Medicare HMO | Admitting: Family Medicine

## 2013-11-03 ENCOUNTER — Encounter: Payer: Self-pay | Admitting: Family Medicine

## 2013-11-03 VITALS — BP 136/82 | HR 84 | Temp 98.2°F | Ht 65.0 in | Wt 219.1 lb

## 2013-11-03 DIAGNOSIS — I10 Essential (primary) hypertension: Secondary | ICD-10-CM

## 2013-11-03 DIAGNOSIS — G609 Hereditary and idiopathic neuropathy, unspecified: Secondary | ICD-10-CM

## 2013-11-03 DIAGNOSIS — F411 Generalized anxiety disorder: Secondary | ICD-10-CM

## 2013-11-03 DIAGNOSIS — F419 Anxiety disorder, unspecified: Secondary | ICD-10-CM

## 2013-11-03 DIAGNOSIS — E119 Type 2 diabetes mellitus without complications: Secondary | ICD-10-CM

## 2013-11-03 DIAGNOSIS — E039 Hypothyroidism, unspecified: Secondary | ICD-10-CM

## 2013-11-03 DIAGNOSIS — E785 Hyperlipidemia, unspecified: Secondary | ICD-10-CM

## 2013-11-03 DIAGNOSIS — F341 Dysthymic disorder: Secondary | ICD-10-CM

## 2013-11-03 DIAGNOSIS — G629 Polyneuropathy, unspecified: Secondary | ICD-10-CM

## 2013-11-03 DIAGNOSIS — F32A Depression, unspecified: Secondary | ICD-10-CM

## 2013-11-03 DIAGNOSIS — F329 Major depressive disorder, single episode, unspecified: Secondary | ICD-10-CM

## 2013-11-03 LAB — HEMOGLOBIN A1C
Hgb A1c MFr Bld: 6 % — ABNORMAL HIGH (ref <5.7)
Mean Plasma Glucose: 126 mg/dL — ABNORMAL HIGH (ref <117)

## 2013-11-03 LAB — CBC
HCT: 44 % (ref 36.0–46.0)
Hemoglobin: 14.7 g/dL (ref 12.0–15.0)
MCH: 30 pg (ref 26.0–34.0)
MCHC: 33.4 g/dL (ref 30.0–36.0)
MCV: 89.8 fL (ref 78.0–100.0)
Platelets: 290 10*3/uL (ref 150–400)
RBC: 4.9 MIL/uL (ref 3.87–5.11)
RDW: 13.6 % (ref 11.5–15.5)
WBC: 9.4 10*3/uL (ref 4.0–10.5)

## 2013-11-03 MED ORDER — ALPRAZOLAM 0.5 MG PO TABS: 0.5000 mg | ORAL_TABLET | Freq: Every day | ORAL | Status: DC | PRN

## 2013-11-03 MED ORDER — CITALOPRAM HYDROBROMIDE 10 MG PO TABS: 10.0000 mg | ORAL_TABLET | Freq: Every day | ORAL | Status: DC

## 2013-11-03 MED ORDER — LISINOPRIL 5 MG PO TABS: 5.0000 mg | ORAL_TABLET | Freq: Every day | ORAL | Status: DC

## 2013-11-03 NOTE — Progress Notes (Signed)
Subjective:     Patient ID: Jenny [name], female   DOB: 1941-06-22, 73 y.o.   MRN: [id number]  CC: 3 mo f/u HTN, hypothyroidism, abnormal glucose levels, COPD, & h/o depression.   HPI Pt was last seen 08/03/13 for annual physical exam and f/u HTN & COPD exacerbation.   1. COPD/asthma- reports occasional wheezing and coughing with walking longer distances & cold air outside.   2. HTN- Pt reports BP elevated; 156/95, lows of 130/80. Reports feeling dehydrated and was mildly stressed. Felt somewhat panicked. Lightheaded mildly. Denies chest tightness, chest pain, SOB, palpitations.  3. Thyroid- Takes medicine q day, no change in energy or weight.   4. Peripheral neuropathy - Reports some relief from neuropathy. Reports numbness in feet and hands, tingling and burning sensations.   5. Depression/anxiety- Stopped Celexa x 1 month but will restart d/t extra family-related stressors.   6- [facility] in [place]- December 2014 mammogram and Left sided Bx. Reports it was benign.  7. Bladder > 5 yrs ago. Reports urgency. Nocturia x 2-6x per day. Has tried multiple meds in past, doesn't remember names, without relief. No h/o UTI.   Review of Systems  Constitutional: Negative for fever, chills, fatigue and unexpected weight change.  Respiratory: Positive for cough and wheezing. Negative for chest tightness and shortness of breath.   Cardiovascular: Negative for chest pain, palpitations and leg swelling.  Genitourinary: Positive for urgency and frequency. Negative for dysuria and hematuria.  Neurological: Positive for light-headedness. Negative for dizziness, syncope and headaches.   Current Outpatient Prescriptions on File Prior to Visit  Medication Sig Dispense Refill  . albuterol (PROVENTIL HFA;VENTOLIN HFA) 108 (90 BASE) MCG/ACT inhaler Inhale 2 puffs into the lungs every 6 (six) hours as needed. For wheezing  1 Inhaler  6  . ALPRAZolam (XANAX) 0.5 MG tablet Take 1 tablet (0.5 mg  total) by mouth daily as needed for sleep or anxiety. For anxiety  40 tablet  1  . aspirin EC 81 MG tablet Take 81 mg by mouth daily.        . budesonide-formoterol (SYMBICORT) 160-4.5 MCG/ACT inhaler Inhale 2 puffs into the lungs 2 (two) times daily.  1 Inhaler  3  . calcium carbonate (OS-CAL) 600 MG TABS Take 600 mg by mouth 2 (two) times daily with a meal.       . Cholecalciferol (VITAMIN D) 1000 UNITS capsule Take 1,000 Units by mouth daily.       . clobetasol ointment (TEMOVATE) 1.19 % Apply 1 application topically daily as needed.      . clotrimazole-betamethasone (LOTRISONE) cream Apply 1 application topically 2 (two) times daily.  45 g  0  . furosemide (LASIX) 40 MG tablet Take 40 mg by mouth daily as needed. For swelling        . gabapentin (NEURONTIN) 300 MG capsule Take 1 capsule (300 mg total) by mouth 3 (three) times daily.  90 capsule  3  . guaiFENesin (MUCINEX) 600 MG 12 hr tablet Take 1,200 mg by mouth 2 (two) times daily as needed.       . hydrOXYzine (ATARAX/VISTARIL) 25 MG tablet Take 25 mg by mouth 3 (three) times daily as needed.      Marland Kitchen levothyroxine (SYNTHROID, LEVOTHROID) 112 MCG tablet Take 1 tablet (112 mcg total) by mouth daily before breakfast.  30 tablet  4  . lisinopril (PRINIVIL,ZESTRIL) 5 MG tablet Take 1 tablet (5 mg total) by mouth daily.  90 tablet  1  . ranitidine (  ZANTAC) 150 MG tablet Take 150 mg by mouth daily.      . [DISCONTINUED] esomeprazole (NEXIUM) 40 MG capsule Take 40 mg by mouth daily before breakfast.         No current facility-administered medications on file prior to visit.   Allergies  Allergen Reactions  . Codeine     REACTION: nausea  . Prednisone     "I forget who I am"  Medrol is ok         Objective:   Physical Exam  Constitutional: Jenny [name] appears well-developed and well-nourished. No distress.  HENT:  Head: Normocephalic and atraumatic.  Eyes: Lids are normal. Right eye exhibits no discharge. Left eye exhibits no discharge.   Neck: Carotid bruit is not present.  Cardiovascular: Normal rate, regular rhythm and normal heart sounds.   Pulmonary/Chest: Effort normal and breath sounds normal.  Skin: Skin is dry. No rash noted.  Psychiatric: Jenny [name] has a normal mood and affect. Her behavior is normal. Judgment and thought content normal.       Assessment:       1.  COPD/chronic asthma 2.  HTN 3. Hypothyroidism 4.  Peripheral Neuropathy  5. Depression/anxiety 6. Preventive Health; benign lesion of left breast detected 09/2013.  7. Urinary urgency & frequency.      Plan:       1. Pt educated on proper use of albuterol. 2. Continue current BP medication regimen. 3. Continue Synthroid. 4. Pt understands different dosing options for Gabapentin. Pt declined changes to dosing at this time. 5. Pt will restart Celexa for depression and anxiety related to family stressors.  6. Pt will continue annual mammography. 7. Not addressed at this visit. Will consider Kegel exercises or PT in future before medicine options. 8. Labs today include HgbA1c, lipid panel, U/A + culture, CBC, TSH, renal panel & LFTs. 9. F/u 3 mo for depression/anxiety  11/03/2013  [place] Calieb Lichtman, PA-S. Patient seen, interviewed and examined with student, agee with documentation  HYPERTENSION Well controlled, no changes  Peripheral neuropathy Using gabapentin  HYPOTHYROIDISM Stable on current doses of Levothyroxine

## 2013-11-03 NOTE — Progress Notes (Signed)
Pre visit review using our clinic review tool, if applicable. No additional management support is needed unless otherwise documented below in the visit note. 

## 2013-11-04 LAB — HEPATIC FUNCTION PANEL
ALT: 9 U/L (ref 0–35)
AST: 15 U/L (ref 0–37)
Albumin: 4.1 g/dL (ref 3.5–5.2)
Alkaline Phosphatase: 85 U/L (ref 39–117)
Bilirubin, Direct: 0.1 mg/dL (ref 0.0–0.3)
Indirect Bilirubin: 0.4 mg/dL (ref 0.0–0.9)
Total Bilirubin: 0.5 mg/dL (ref 0.3–1.2)
Total Protein: 7.1 g/dL (ref 6.0–8.3)

## 2013-11-04 LAB — URINALYSIS
Bilirubin Urine: NEGATIVE
Glucose, UA: NEGATIVE mg/dL
Hgb urine dipstick: NEGATIVE
Ketones, ur: NEGATIVE mg/dL
Leukocytes, UA: NEGATIVE
Nitrite: NEGATIVE
Protein, ur: NEGATIVE mg/dL
Specific Gravity, Urine: 1.018 (ref 1.005–1.030)
Urobilinogen, UA: 0.2 mg/dL (ref 0.0–1.0)
pH: 6 (ref 5.0–8.0)

## 2013-11-04 LAB — RENAL FUNCTION PANEL
Albumin: 4.1 g/dL (ref 3.5–5.2)
BUN: 19 mg/dL (ref 6–23)
CO2: 29 mEq/L (ref 19–32)
Calcium: 10 mg/dL (ref 8.4–10.5)
Chloride: 104 mEq/L (ref 96–112)
Creat: 0.86 mg/dL (ref 0.50–1.10)
Glucose, Bld: 102 mg/dL — ABNORMAL HIGH (ref 70–99)
Phosphorus: 3.4 mg/dL (ref 2.3–4.6)
Potassium: 5.5 mEq/L — ABNORMAL HIGH (ref 3.5–5.3)
Sodium: 141 meq/L (ref 135–145)

## 2013-11-04 LAB — LIPID PANEL
Cholesterol: 120 mg/dL (ref 0–200)
HDL: 45 mg/dL (ref >39)
LDL Cholesterol: 59 mg/dL (ref 0–99)
Total CHOL/HDL Ratio: 2.7 {ratio}
Triglycerides: 78 mg/dL (ref <150)
VLDL: 16 mg/dL (ref 0–40)

## 2013-11-04 LAB — URINE CULTURE: Colony Count: 7000

## 2013-11-04 LAB — TSH: TSH: 0.055 u[IU]/mL — ABNORMAL LOW (ref 0.350–4.500)

## 2013-11-06 ENCOUNTER — Telehealth: Payer: Self-pay | Admitting: Family Medicine

## 2013-11-06 NOTE — Telephone Encounter (Signed)
Relevant patient education assigned to patient using Emmi. ° °

## 2013-11-06 NOTE — Assessment & Plan Note (Signed)
Stable on current doses of Levothyroxine

## 2013-11-06 NOTE — Assessment & Plan Note (Signed)
Using gabapentin

## 2013-11-06 NOTE — Assessment & Plan Note (Signed)
Well controlled, no changes 

## 2013-11-07 ENCOUNTER — Telehealth: Payer: Self-pay

## 2013-11-07 DIAGNOSIS — E039 Hypothyroidism, unspecified: Secondary | ICD-10-CM

## 2013-11-07 MED ORDER — LEVOTHYROXINE SODIUM 100 MCG PO TABS: 100.0000 ug | ORAL_TABLET | Freq: Every day | ORAL | Status: DC

## 2013-11-07 NOTE — Telephone Encounter (Signed)
Relevant patient education assigned to patient using Emmi. ° °

## 2013-11-07 NOTE — Telephone Encounter (Signed)
RX sent and lab order placed

## 2013-12-20 ENCOUNTER — Encounter: Payer: Self-pay | Admitting: Family Medicine

## 2014-02-08 ENCOUNTER — Other Ambulatory Visit: Payer: Self-pay | Admitting: Family Medicine

## 2014-02-09 ENCOUNTER — Other Ambulatory Visit: Payer: Self-pay | Admitting: Family Medicine

## 2014-02-09 NOTE — Telephone Encounter (Signed)
Left detailed message informing patient of medication refill and to call our office to schedule follow up appointment

## 2014-02-09 NOTE — Telephone Encounter (Signed)
90 day supply levothyroxine sent to pt's pharmacy. Pt was due for follow up in April with Dr Charlett Blake.  Please call pt to arrange follow up.

## 2014-03-09 ENCOUNTER — Encounter: Payer: Self-pay | Admitting: Family Medicine

## 2014-03-09 ENCOUNTER — Other Ambulatory Visit: Payer: Self-pay | Admitting: Family Medicine

## 2014-03-09 ENCOUNTER — Ambulatory Visit (INDEPENDENT_AMBULATORY_CARE_PROVIDER_SITE_OTHER): Payer: Commercial Managed Care - HMO | Admitting: Family Medicine

## 2014-03-09 VITALS — BP 122/88 | HR 82 | Temp 98.5°F | Ht 65.0 in | Wt 223.0 lb

## 2014-03-09 DIAGNOSIS — J45909 Unspecified asthma, uncomplicated: Secondary | ICD-10-CM

## 2014-03-09 DIAGNOSIS — E785 Hyperlipidemia, unspecified: Secondary | ICD-10-CM

## 2014-03-09 DIAGNOSIS — T7840XA Allergy, unspecified, initial encounter: Secondary | ICD-10-CM

## 2014-03-09 DIAGNOSIS — K219 Gastro-esophageal reflux disease without esophagitis: Secondary | ICD-10-CM

## 2014-03-09 DIAGNOSIS — I10 Essential (primary) hypertension: Secondary | ICD-10-CM

## 2014-03-09 DIAGNOSIS — R7309 Other abnormal glucose: Secondary | ICD-10-CM

## 2014-03-09 DIAGNOSIS — R1013 Epigastric pain: Secondary | ICD-10-CM

## 2014-03-09 DIAGNOSIS — J40 Bronchitis, not specified as acute or chronic: Secondary | ICD-10-CM

## 2014-03-09 DIAGNOSIS — R739 Hyperglycemia, unspecified: Secondary | ICD-10-CM

## 2014-03-09 DIAGNOSIS — E039 Hypothyroidism, unspecified: Secondary | ICD-10-CM

## 2014-03-09 LAB — RENAL FUNCTION PANEL
Albumin: 3.8 g/dL (ref 3.5–5.2)
BUN: 14 mg/dL (ref 6–23)
CO2: 29 meq/L (ref 19–32)
Calcium: 9.4 mg/dL (ref 8.4–10.5)
Chloride: 107 meq/L (ref 96–112)
Creat: 0.96 mg/dL (ref 0.50–1.10)
Glucose, Bld: 102 mg/dL — ABNORMAL HIGH (ref 70–99)
Phosphorus: 2.8 mg/dL (ref 2.3–4.6)
Potassium: 5.3 mEq/L (ref 3.5–5.3)
Sodium: 142 mEq/L (ref 135–145)

## 2014-03-09 LAB — HEPATIC FUNCTION PANEL
ALT: 11 U/L (ref 0–35)
AST: 17 U/L (ref 0–37)
Albumin: 3.8 g/dL (ref 3.5–5.2)
Alkaline Phosphatase: 76 U/L (ref 39–117)
Bilirubin, Direct: 0.1 mg/dL (ref 0.0–0.3)
Indirect Bilirubin: 0.4 mg/dL (ref 0.2–1.2)
Total Bilirubin: 0.5 mg/dL (ref 0.2–1.2)
Total Protein: 6.5 g/dL (ref 6.0–8.3)

## 2014-03-09 LAB — LIPID PANEL
Cholesterol: 135 mg/dL (ref 0–200)
HDL: 36 mg/dL — ABNORMAL LOW (ref >39)
LDL Cholesterol: 77 mg/dL (ref 0–99)
Total CHOL/HDL Ratio: 3.8 Ratio
Triglycerides: 109 mg/dL (ref <150)
VLDL: 22 mg/dL (ref 0–40)

## 2014-03-09 LAB — TSH: TSH: 1.257 u[IU]/mL (ref 0.350–4.500)

## 2014-03-09 LAB — CBC
HCT: 43.4 % (ref 36.0–46.0)
Hemoglobin: 14.5 g/dL (ref 12.0–15.0)
MCH: 29.1 pg (ref 26.0–34.0)
MCHC: 33.4 g/dL (ref 30.0–36.0)
MCV: 87.1 fL (ref 78.0–100.0)
Platelets: 288 10*3/uL (ref 150–400)
RBC: 4.98 MIL/uL (ref 3.87–5.11)
RDW: 13.7 % (ref 11.5–15.5)
WBC: 7.7 10*3/uL (ref 4.0–10.5)

## 2014-03-09 MED ORDER — METHYLPREDNISOLONE 4 MG PO KIT
PACK | ORAL | Status: AC
Start: 2014-03-09 — End: 2014-03-16

## 2014-03-09 MED ORDER — MONTELUKAST SODIUM 10 MG PO TABS: 10.0000 mg | ORAL_TABLET | Freq: Every day | ORAL | Status: DC

## 2014-03-09 NOTE — Patient Instructions (Signed)

## 2014-03-09 NOTE — Progress Notes (Signed)
Patient ID: Jenny [name], female   DOB: November 09, 1940, 73 y.o.   MRN: [id number] Jenny [name] [id number] 01-Aug-1941 03/09/2014      Progress Note-Follow Up  Subjective  Chief Complaint  Chief Complaint  Patient presents with  . Medication Refill    HPI  Patient is a 73 year old female in today for routine medical care. In today for followup. Continues to work long hours but her hours have cut back some. 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 She has chronic low back pain and weakness in her lower extremities but no recent falls. She is noting her husbands been worse recently with bloating and discomfort. She is also complaining of increased fatigue over the last 3 weeks. No significant respiratory symptoms. Denies CP/palp/SOB/HA/congestion/fevers/GI or GU c/o. Taking meds as prescribed  Past Medical History  Diagnosis Date  . Allergy     rhinitis  . Bronchitis   . Venous insufficiency   . Obesity   . Hypothyroidism   . GERD (gastroesophageal reflux disease)   . Diverticulosis of colon   . Urinary incontinence   . Degenerative joint disease   . Lumbar back pain   . Vitamin D deficiency   . Dysthymia   . Asthma     mild, intermittent  . Transient memory loss March 2006    after a stressful event she had no memory the rest of that day  . VITAMIN D DEFICIENCY 08/21/2008  . VENOUS INSUFFICIENCY 08/23/2007  . Other urinary incontinence 03/12/2010  . OBESITY 08/23/2007  . HYPOTHYROIDISM 08/23/2007  . HAND PAIN 04/09/2010  . GERD 08/23/2007  . 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  . DIVERTICULOSIS OF COLON 02/22/2008  . DIABETES MELLITUS, BORDERLINE 08/21/2008  . DEGENERATIVE JOINT DISEASE 02/22/2008  . BRONCHITIS, RECURRENT 02/22/2008  . BACK PAIN, LUMBAR 08/23/2007  . ALLERGIC RHINITIS 08/23/2007  . Asthma 01/14/2011  . 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 History of chicken pox 01/14/2011  . History of measles 01/14/2011  . PONV (postoperative nausea and vomiting)    . Peripheral neuropathy 02/12/2011    possibly related to spondylolisthesis per pt  . Urinary frequency   . Cough     usually not productive  . Incontinence of urine   . Obstructive sleep apnea     does not use CPAP, lost weight  . SLEEP APNEA, OBSTRUCTIVE 08/23/2007  . Hypertension   . HYPERTENSION 02/22/2008  . Anemia 09/21/2011  . UTI (lower urinary tract infection) 03/23/2012  . Atrophic vaginitis 06/17/2012  . Annual physical exam 08/06/2013    Past Surgical History  Procedure Laterality Date  . Appendectomy    . Tonsillectomy and adenoidectomy    . Ap repair and sling for cystocele  8-04     Dr Gaynelle Arabian  . Left hand  2010    left thumb, joint removal, [facility]  . Ctr      bilateral  . Shoulder arthroscopy distal clavicle excision and open rotator cuff repair      right  . Neck surgery      x 2 - discectomy, fusion  . Abdominal hysterectomy      took one ovary  . Cholecystectomy    . Carpal tunel  2008    Family History  Problem Relation Age of Onset  . COPD Mother   . Pneumonia Mother   . Other Mother     CHF  . Hypertension Mother   . Arthritis Mother  septic knee after replacement  . Heart disease Mother     chf  . Dementia Father   . Arthritis Brother   . Asthma Daughter   . Cancer Maternal Aunt     breast  . Stroke Maternal Grandmother   . Heart disease Maternal Grandmother   . Other Maternal Grandfather     CHF  . Heart disease Maternal Grandfather     chf  . Cancer Paternal Grandmother     stomach?  . Other Paternal Grandfather     problems with kidneys  . Kidney disease Paternal Grandfather   . Anxiety disorder Daughter   . Cancer Maternal Aunt     liver    History   Social History  . Marital Status: Widowed    Spouse Name: N/A    Number of Children: N/A  . Years of Education: N/A   Occupational History  . Not on file.   Social History Main Topics  . Smoking status: Former Smoker -- 1.50 packs/day for 30 years    Types:  Cigarettes    Quit date: 10/13/1991  . Smokeless tobacco: Never Used  . Alcohol Use: No  . Drug Use: No  . Sexual Activity: No   Other Topics Concern  . Not on file   Social History Narrative  . No narrative on file    Current Outpatient Prescriptions on File Prior to Visit  Medication Sig Dispense Refill  . albuterol (PROVENTIL HFA;VENTOLIN HFA) 108 (90 BASE) MCG/ACT inhaler Inhale 2 puffs into the lungs every 6 (six) hours as needed. For wheezing  1 Inhaler  6  . ALPRAZolam (XANAX) 0.5 MG tablet Take 1 tablet (0.5 mg total) by mouth daily as needed for sleep or anxiety. For anxiety  40 tablet  1  . aspirin EC 81 MG tablet Take 81 mg by mouth daily.        . budesonide-formoterol (SYMBICORT) 160-4.5 MCG/ACT inhaler Inhale 2 puffs into the lungs 2 (two) times daily.  1 Inhaler  3  . calcium carbonate (OS-CAL) 600 MG TABS Take 600 mg by mouth 2 (two) times daily with a meal.       . Cholecalciferol (VITAMIN D) 1000 UNITS capsule Take 1,000 Units by mouth daily.       . citalopram (CELEXA) 10 MG tablet Take 1 tablet (10 mg total) by mouth daily.  30 tablet  5  . clobetasol ointment (TEMOVATE) 0.62 % Apply 1 application topically daily as needed.      . clotrimazole-betamethasone (LOTRISONE) cream Apply 1 application topically 2 (two) times daily.  45 g  0  . furosemide (LASIX) 40 MG tablet Take 40 mg by mouth daily as needed. For swelling        . gabapentin (NEURONTIN) 300 MG capsule Take 1 capsule (300 mg total) by mouth 3 (three) times daily.  90 capsule  3  . guaiFENesin (MUCINEX) 600 MG 12 hr tablet Take 1,200 mg by mouth 2 (two) times daily as needed.       . hydrOXYzine (ATARAX/VISTARIL) 25 MG tablet Take 25 mg by mouth 3 (three) times daily as needed.      Marland Kitchen levothyroxine (SYNTHROID, LEVOTHROID) 100 MCG tablet TAKE 1 TABLET BY MOUTH EVERY DAY BEFORE BREAKFAST  90 tablet  0  . lisinopril (PRINIVIL,ZESTRIL) 5 MG tablet Take 1 tablet (5 mg total) by mouth daily.  90 tablet  1  .  ranitidine (ZANTAC) 150 MG tablet Take 150 mg by mouth daily.      . [  DISCONTINUED] esomeprazole (NEXIUM) 40 MG capsule Take 40 mg by mouth daily before breakfast.         No current facility-administered medications on file prior to visit.    Allergies  Allergen Reactions  . Codeine     REACTION: nausea  . Prednisone     "I forget who I am"  Medrol is ok    Review of Systems  Review of Systems  Constitutional: Negative for fever and malaise/fatigue.  HENT: Negative for congestion.   Eyes: Negative for discharge.  Respiratory: Negative for shortness of breath.   Cardiovascular: Negative for chest pain, palpitations and leg swelling.  Gastrointestinal: Negative for nausea, abdominal pain and diarrhea.  Genitourinary: Negative for dysuria.  Musculoskeletal: Negative for falls.  Skin: Negative for rash.  Neurological: Negative for loss of consciousness and headaches.  Endo/Heme/Allergies: Negative for polydipsia.  Psychiatric/Behavioral: Negative for depression and suicidal ideas. The patient is not nervous/anxious and does not have insomnia.     Objective  BP 122/88  Pulse 82  Temp(Src) 98.5 F (36.9 C) (Oral)  Ht 5\' 5"  (1.651 m)  Wt 223 lb (101.152 kg)  BMI 37.11 kg/m2  SpO2 94%  Physical Exam  Physical Exam  Constitutional: She is oriented to person, place, and time and well-developed, well-nourished, and in no distress. No distress.  HENT:  Head: Normocephalic and atraumatic.  Eyes: Conjunctivae are normal.  Neck: Neck supple. No thyromegaly present.  Cardiovascular: Normal rate, regular rhythm and normal heart sounds.   No murmur heard. Pulmonary/Chest: Effort normal and breath sounds normal. She has no wheezes.  Abdominal: She exhibits no distension and no mass.  Musculoskeletal: She exhibits no edema.  Lymphadenopathy:    She has no cervical adenopathy.  Neurological: She is alert and oriented to person, place, and time.  Skin: Skin is warm and dry. No  rash noted. She is not diaphoretic.  Psychiatric: Memory, affect and judgment normal.    Lab Results  Component Value Date   TSH 0.055* 11/03/2013   Lab Results  Component Value Date   WBC 9.4 11/03/2013   HGB 14.7 11/03/2013   HCT 44.0 11/03/2013   MCV 89.8 11/03/2013   PLT 290 11/03/2013   Lab Results  Component Value Date   CREATININE 0.86 11/03/2013   BUN 19 11/03/2013   NA 141 11/03/2013   K 5.5* 11/03/2013   CL 104 11/03/2013   CO2 29 11/03/2013   Lab Results  Component Value Date   ALT 9 11/03/2013   AST 15 11/03/2013   ALKPHOS 85 11/03/2013   BILITOT 0.5 11/03/2013   Lab Results  Component Value Date   CHOL 120 11/03/2013   Lab Results  Component Value Date   HDL 45 11/03/2013   Lab Results  Component Value Date   LDLCALC 59 11/03/2013   Lab Results  Component Value Date   TRIG 78 11/03/2013   Lab Results  Component Value Date   CHOLHDL 2.7 11/03/2013     Assessment & Plan

## 2014-03-09 NOTE — Progress Notes (Signed)
Pre visit review using our clinic review tool, if applicable. No additional management support is needed unless otherwise documented below in the visit note. 

## 2014-03-10 LAB — HEMOGLOBIN A1C
Hgb A1c MFr Bld: 6.1 % — ABNORMAL HIGH (ref <5.7)
Mean Plasma Glucose: 128 mg/dL — ABNORMAL HIGH (ref <117)

## 2014-03-11 DIAGNOSIS — E782 Mixed hyperlipidemia: Secondary | ICD-10-CM | POA: Insufficient documentation

## 2014-03-11 NOTE — Assessment & Plan Note (Signed)
hgba1c acceptable, minimize simple carbs. Increase exercise as tolerated. Continue current meds 

## 2014-03-11 NOTE — Assessment & Plan Note (Signed)
Encouraged heart healthy diet, increase exercise, avoid trans fats, consider a krill oil cap daily 

## 2014-03-11 NOTE — Assessment & Plan Note (Signed)
Well controlled, no changes to meds. Encouraged heart healthy diet such as the DASH diet and exercise as tolerated.  °

## 2014-03-11 NOTE — Assessment & Plan Note (Signed)
On Levothyroxine, continue to monitor 

## 2014-03-11 NOTE — Assessment & Plan Note (Signed)
Avoid offending foods, start probiotics. Do not eat large meals in late evening and consider raising head of bed. H pylori checked

## 2014-03-12 LAB — H. PYLORI ANTIBODY, IGG: H Pylori IgG: <0.4 {ISR}

## 2014-03-19 ENCOUNTER — Other Ambulatory Visit: Payer: Self-pay | Admitting: Family Medicine

## 2014-04-18 ENCOUNTER — Other Ambulatory Visit: Payer: Self-pay | Admitting: Family Medicine

## 2014-04-23 ENCOUNTER — Other Ambulatory Visit: Payer: Self-pay | Admitting: Family Medicine

## 2014-04-24 NOTE — Telephone Encounter (Signed)
Please advise refill?  Last RX was done on 11-03-13 quantity 40 with 1 refill  Last OV was 03-09-14

## 2014-04-25 ENCOUNTER — Ambulatory Visit: Payer: Medicare HMO | Admitting: Medical

## 2014-04-25 NOTE — Telephone Encounter (Signed)
Ok to call in 40 tabs zero refills.

## 2014-04-26 ENCOUNTER — Ambulatory Visit (INDEPENDENT_AMBULATORY_CARE_PROVIDER_SITE_OTHER): Payer: Commercial Managed Care - HMO | Admitting: Medical

## 2014-04-26 ENCOUNTER — Other Ambulatory Visit: Payer: Self-pay | Admitting: Medical

## 2014-04-26 ENCOUNTER — Other Ambulatory Visit: Payer: Self-pay | Admitting: Family Medicine

## 2014-04-26 ENCOUNTER — Encounter: Payer: Self-pay | Admitting: Medical

## 2014-04-26 VITALS — BP 118/88 | HR 79 | Temp 98.2°F | Resp 16 | Ht 65.0 in | Wt 225.8 lb

## 2014-04-26 DIAGNOSIS — M25562 Pain in left knee: Secondary | ICD-10-CM | POA: Insufficient documentation

## 2014-04-26 DIAGNOSIS — M25569 Pain in unspecified knee: Secondary | ICD-10-CM

## 2014-04-26 DIAGNOSIS — M25552 Pain in left hip: Secondary | ICD-10-CM | POA: Insufficient documentation

## 2014-04-26 DIAGNOSIS — M25559 Pain in unspecified hip: Secondary | ICD-10-CM

## 2014-04-26 MED ORDER — KETOROLAC TROMETHAMINE 30 MG/ML IJ SOLN
30.0000 mg | Freq: Once | INTRAMUSCULAR | Status: AC
  Administered 2014-04-26: 30 mg via INTRAMUSCULAR

## 2014-04-26 MED ORDER — TRAMADOL HCL 50 MG PO TABS: 50.0000 mg | ORAL_TABLET | Freq: Three times a day (TID) | ORAL | Status: DC | PRN

## 2014-04-26 MED ORDER — DICLOFENAC SODIUM 75 MG PO TBEC: 75.0000 mg | DELAYED_RELEASE_TABLET | Freq: Two times a day (BID) | ORAL | Status: DC

## 2014-04-26 MED ORDER — KETOROLAC TROMETHAMINE 60 MG/2ML IM SOLN: 30.0000 mg | Freq: Once | INTRAMUSCULAR | Status: DC

## 2014-04-26 NOTE — Progress Notes (Signed)
   Subjective:    Patient ID: Jenny [name], female    DOB: 11/16/1940, 73 y.o.   MRN: [id number]  HPI  Pt in with both lt sided hip and lt side knee pain. Started in the knee but know getting hip pain. Feels like bone is aches. Pain in knee just started 2 wk ago. Occasional mild transient pain in the past. Pain worst at night. Pt has orthopedist who has seen her for joint pain various areas but pt not sure he has ever seen her for these particular pains. Pt taking ibuprofen at night only. Pt wants to see her orthopedist Dr.  Alphonzo Severance.  No symettric bilateral type arthralgias noted.  No pain associated to trauma.    Review of Systems  Constitutional: Negative for fever, chills and fatigue.  Respiratory: Negative for cough and chest tightness.   Cardiovascular: Negative for chest pain and palpitations.  Musculoskeletal: Positive for arthralgias.       Knee pain and hip pain.  Skin: Negative.   Hematological: Does not bruise/bleed easily.       Objective:   Physical Exam  General Mental Status- Alert. General Appearance- Not in acute distress.  Skin General:-Color-Normal Color. Moisture- Normal Moisture.(of knee no warmth felt)    Chest and Lung Exam Auscultation:Rhythm- Regular. Murmurs & Other Heart Sounds: Auscultation of the heart reveals- No murmurs.  Musculoskeletal  Lt hip- from, no pain, no crepitus presently. Lt knee- mild crepitus on flexion and extension. No instability.  On palpation tibial plateu medial aspect pain. No pain on valgus or varus stress.  .  .      Assessment & Plan:  1. In addition to problem based plans if orthopedist does not find/resolve cause and she returns with recurrent pain or more arthralgias would consider arthritis panel and uric acid.  2. Note pt declined xray today and wants to leave that to orthopedist.

## 2014-04-26 NOTE — Assessment & Plan Note (Signed)
torodol in office. Start diclofenac tomorrow. Refer to orthopedist.

## 2014-04-26 NOTE — Progress Notes (Signed)
Pre visit review using our clinic review tool, if applicable. No additional management support is needed unless otherwise documented below in the visit note/SLS  

## 2014-04-26 NOTE — Patient Instructions (Signed)
Please attend orthopedist referral. If symptoms worsen or change prior to referral please let us know. Start diclofenac tomorrow. Stop all otc nsaids.

## 2014-04-27 ENCOUNTER — Telehealth: Payer: Self-pay | Admitting: Family Medicine

## 2014-04-27 NOTE — Telephone Encounter (Signed)
Patient Information:  Caller Name: [name]  Phone: [phone]  Patient: Jenny, [name]  Gender: Female  DOB: 1940/12/20  Age: 73 Years  PCP: Jenny [name] [facility])  Office Follow Up:  Does the office need to follow up with this patient?: Yes  Instructions For The Office: Left sided back pain  RN Note:  Patient calling with c/o left sided back pain  since receiving Toradol injection in left buttocks on 04/26/14 (injection for knee pain).  Denies swelling or redness at site.  States "It's just like the pain I had before the back surgery I had a couple of years ago."  Relief comes with rest.  Difficulty preforming ADL's.  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  Declines appointment stating "I just need to know if this pain could be coming from the shot I got."  Desires a call back.  Symptoms  Reason For Call & Symptoms: left back spasms since getting Toradol injection in left buttocks  Reviewed Health History In EMR: Yes  Reviewed Medications In EMR: Yes  Reviewed Allergies In EMR: Yes  Reviewed Surgeries / Procedures: Yes  Date of Onset of Symptoms: 04/26/2014  Guideline(s) Used:  Back Pain  Disposition Per Guideline:   Go to Office Now  Reason For Disposition Reached:   Severe back pain  Advice Given:  Call Back If:  Numbness or weakness occur  Bowel/bladder problems occur  Pain lasts for more than 2 weeks  You become worse.  Patient Refused Recommendation:  Patient Refused Care Advice  Patient requesting call back from office

## 2014-04-27 NOTE — Telephone Encounter (Signed)
Please advise 

## 2014-04-27 NOTE — Telephone Encounter (Signed)
I called pt back regarding her pain as described on phone nurse call. Pt states she took diclofenac I rx'd and felt significantly better. Also she was walking today and states she felt better. Pt speculates that getting up on exam table may have re-injured her back. I advised her that based on location of toradol injection and technique ma uses this should not/would not cause back pain. Pt offered appointment today and declined. No radiating pain when I talked with her. No incontinence. No leg weakness. If worsens over weekend advised seek care. Update [place] on Monday how she is. Pt agreed.

## 2014-04-27 NOTE — Telephone Encounter (Signed)
Rx called to pharmacy voicemail. 

## 2014-05-18 ENCOUNTER — Other Ambulatory Visit: Payer: Self-pay | Admitting: Family Medicine

## 2014-05-21 ENCOUNTER — Telehealth: Payer: Self-pay | Admitting: Family Medicine

## 2014-05-21 ENCOUNTER — Other Ambulatory Visit: Payer: Self-pay | Admitting: Family Medicine

## 2014-05-21 DIAGNOSIS — M5442 Lumbago with sciatica, left side: Principal | ICD-10-CM

## 2014-05-21 DIAGNOSIS — M5441 Lumbago with sciatica, right side: Secondary | ICD-10-CM

## 2014-05-21 NOTE — Telephone Encounter (Signed)
RX SENT TO PHARMACY/LDM

## 2014-05-21 NOTE — Telephone Encounter (Signed)
Patient states that we had referred her to Dr. Sharol Given. She states that the dr told her that he doesn't think her pain is from her knees. He thinks that the pain is coming from her back. Now patient is requesting a referral to Dr. Nicholes Calamity

## 2014-05-21 NOTE — Telephone Encounter (Signed)
Referral placed.

## 2014-05-21 NOTE — Telephone Encounter (Signed)
Please advise 

## 2014-06-10 ENCOUNTER — Other Ambulatory Visit: Payer: Self-pay | Admitting: Family

## 2014-06-11 NOTE — Telephone Encounter (Signed)
Last rx printed 04/25/14, #40.  Rx printed and forwarded to Provider for signature.  Medication name:  Name from pharmacy:  ALPRAZolam (XANAX) 0.5 MG tablet  ALPRAZOLAM 0.5MG  TABLETS Sig: TAKE 1 TABLET BY MOUTH EVERY DAY AS NEEDED FOR SLEEP/ANXIETY Dispense: 40 tablet Refills: 0 Start: 06/10/2014 Class: Normal Requested on: 06/10/2014 Originally ordered on: 01/14/2011 Last refill: 04/27/2014

## 2014-06-11 NOTE — Telephone Encounter (Signed)
rx faxed

## 2014-06-14 ENCOUNTER — Other Ambulatory Visit: Payer: Self-pay | Admitting: Neurosurgery

## 2014-06-14 DIAGNOSIS — M5416 Radiculopathy, lumbar region: Secondary | ICD-10-CM

## 2014-06-25 ENCOUNTER — Ambulatory Visit
Admission: RE | Admit: 2014-06-25 | Discharge: 2014-06-25 | Disposition: A | Discharge status: Home / Self Care | Payer: Commercial Managed Care - HMO | Source: Ambulatory Visit | Attending: Neurosurgery | Admitting: Neurosurgery

## 2014-06-25 VITALS — BP 156/73 | HR 75 | Wt 225.0 lb

## 2014-06-25 DIAGNOSIS — M5416 Radiculopathy, lumbar region: Secondary | ICD-10-CM

## 2014-06-25 DIAGNOSIS — M25562 Pain in left knee: Secondary | ICD-10-CM

## 2014-06-25 DIAGNOSIS — M25552 Pain in left hip: Secondary | ICD-10-CM

## 2014-06-25 MED ORDER — IOHEXOL 180 MG/ML  SOLN
15.0000 mL | Freq: Once | INTRAMUSCULAR | Status: AC | PRN
  Administered 2014-06-25: 15 mL via INTRATHECAL

## 2014-06-25 MED ORDER — DIAZEPAM 5 MG PO TABS
5.0000 mg | ORAL_TABLET | Freq: Once | ORAL | Status: AC
  Administered 2014-06-25: 5 mg via ORAL

## 2014-06-25 NOTE — Discharge Instructions (Signed)

## 2014-07-04 ENCOUNTER — Other Ambulatory Visit: Payer: Self-pay | Admitting: Neurosurgery

## 2014-07-04 DIAGNOSIS — M48061 Spinal stenosis, lumbar region without neurogenic claudication: Secondary | ICD-10-CM

## 2014-07-25 ENCOUNTER — Other Ambulatory Visit: Payer: Self-pay | Admitting: Family Medicine

## 2014-07-25 ENCOUNTER — Other Ambulatory Visit: Payer: Commercial Managed Care - HMO

## 2014-07-26 NOTE — Telephone Encounter (Signed)
Last RX was done on 06-11-14  rx printed for md to sign and fax

## 2014-07-27 ENCOUNTER — Other Ambulatory Visit: Payer: Self-pay

## 2014-07-31 ENCOUNTER — Other Ambulatory Visit: Payer: Self-pay | Admitting: Neurosurgery

## 2014-07-31 ENCOUNTER — Ambulatory Visit
Admission: RE | Admit: 2014-07-31 | Discharge: 2014-07-31 | Disposition: A | Discharge status: Home / Self Care | Payer: Commercial Managed Care - HMO | Source: Ambulatory Visit | Attending: Neurosurgery | Admitting: Neurosurgery

## 2014-07-31 DIAGNOSIS — M48061 Spinal stenosis, lumbar region without neurogenic claudication: Secondary | ICD-10-CM

## 2014-07-31 MED ORDER — METHYLPREDNISOLONE ACETATE 40 MG/ML INJ SUSP (RADIOLOG
120.0000 mg | Freq: Once | INTRAMUSCULAR | Status: AC
  Administered 2014-07-31: 120 mg via EPIDURAL

## 2014-07-31 MED ORDER — IOHEXOL 180 MG/ML  SOLN
1.0000 mL | Freq: Once | INTRAMUSCULAR | Status: AC | PRN
  Administered 2014-07-31: 1 mL via EPIDURAL

## 2014-07-31 NOTE — Discharge Instructions (Signed)

## 2014-08-20 ENCOUNTER — Other Ambulatory Visit: Payer: Self-pay | Admitting: Family Medicine

## 2014-08-22 ENCOUNTER — Other Ambulatory Visit: Payer: Self-pay | Admitting: Medical

## 2014-08-27 ENCOUNTER — Other Ambulatory Visit: Payer: Self-pay

## 2014-08-27 ENCOUNTER — Other Ambulatory Visit: Payer: Self-pay | Admitting: Family Medicine

## 2014-08-27 ENCOUNTER — Other Ambulatory Visit: Payer: Self-pay | Admitting: Medical

## 2014-08-27 ENCOUNTER — Telehealth: Payer: Self-pay | Admitting: Family Medicine

## 2014-08-27 ENCOUNTER — Telehealth: Payer: Self-pay

## 2014-08-27 ENCOUNTER — Emergency Department (HOSPITAL_COMMUNITY)
Admission: EM | Admit: 2014-08-27 | Discharge: 2014-08-27 | Disposition: A | Discharge status: Home / Self Care | Payer: Medicare HMO | Attending: Emergency Medicine | Admitting: Emergency Medicine

## 2014-08-27 ENCOUNTER — Emergency Department (HOSPITAL_COMMUNITY): Payer: Medicare HMO

## 2014-08-27 DIAGNOSIS — R079 Chest pain, unspecified: Secondary | ICD-10-CM | POA: Diagnosis present

## 2014-08-27 DIAGNOSIS — E669 Obesity, unspecified: Secondary | ICD-10-CM | POA: Insufficient documentation

## 2014-08-27 DIAGNOSIS — M1990 Unspecified osteoarthritis, unspecified site: Secondary | ICD-10-CM | POA: Diagnosis not present

## 2014-08-27 DIAGNOSIS — Z8619 Personal history of other infectious and parasitic diseases: Secondary | ICD-10-CM | POA: Diagnosis not present

## 2014-08-27 DIAGNOSIS — K219 Gastro-esophageal reflux disease without esophagitis: Secondary | ICD-10-CM | POA: Diagnosis not present

## 2014-08-27 DIAGNOSIS — Z9049 Acquired absence of other specified parts of digestive tract: Secondary | ICD-10-CM | POA: Insufficient documentation

## 2014-08-27 DIAGNOSIS — E559 Vitamin D deficiency, unspecified: Secondary | ICD-10-CM | POA: Diagnosis not present

## 2014-08-27 DIAGNOSIS — Z87440 Personal history of urinary (tract) infections: Secondary | ICD-10-CM | POA: Diagnosis not present

## 2014-08-27 DIAGNOSIS — J45909 Unspecified asthma, uncomplicated: Secondary | ICD-10-CM | POA: Diagnosis not present

## 2014-08-27 DIAGNOSIS — Z90710 Acquired absence of both cervix and uterus: Secondary | ICD-10-CM | POA: Diagnosis not present

## 2014-08-27 DIAGNOSIS — Z79899 Other long term (current) drug therapy: Secondary | ICD-10-CM | POA: Insufficient documentation

## 2014-08-27 DIAGNOSIS — Z792 Long term (current) use of antibiotics: Secondary | ICD-10-CM | POA: Insufficient documentation

## 2014-08-27 DIAGNOSIS — Z87891 Personal history of nicotine dependence: Secondary | ICD-10-CM | POA: Diagnosis not present

## 2014-08-27 DIAGNOSIS — E119 Type 2 diabetes mellitus without complications: Secondary | ICD-10-CM | POA: Diagnosis not present

## 2014-08-27 DIAGNOSIS — Z7982 Long term (current) use of aspirin: Secondary | ICD-10-CM | POA: Insufficient documentation

## 2014-08-27 DIAGNOSIS — Z8742 Personal history of other diseases of the female genital tract: Secondary | ICD-10-CM | POA: Diagnosis not present

## 2014-08-27 DIAGNOSIS — E039 Hypothyroidism, unspecified: Secondary | ICD-10-CM | POA: Insufficient documentation

## 2014-08-27 DIAGNOSIS — Z862 Personal history of diseases of the blood and blood-forming organs and certain disorders involving the immune mechanism: Secondary | ICD-10-CM | POA: Insufficient documentation

## 2014-08-27 DIAGNOSIS — G629 Polyneuropathy, unspecified: Secondary | ICD-10-CM | POA: Diagnosis not present

## 2014-08-27 LAB — BASIC METABOLIC PANEL
Anion gap: 11 (ref 5–15)
BUN: 20 mg/dL (ref 6–23)
CO2: 25 mEq/L (ref 19–32)
Calcium: 9.5 mg/dL (ref 8.4–10.5)
Chloride: 103 mEq/L (ref 96–112)
Creatinine, Ser: 0.97 mg/dL (ref 0.50–1.10)
GFR calc Af Amer: 65 mL/min — ABNORMAL LOW (ref >90)
GFR calc non Af Amer: 56 mL/min — ABNORMAL LOW (ref >90)
Glucose, Bld: 92 mg/dL (ref 70–99)
Potassium: 5.1 mEq/L (ref 3.7–5.3)
Sodium: 139 meq/L (ref 137–147)

## 2014-08-27 LAB — CBC
HCT: 42.5 % (ref 36.0–46.0)
Hemoglobin: 13.8 g/dL (ref 12.0–15.0)
MCH: 30 pg (ref 26.0–34.0)
MCHC: 32.5 g/dL (ref 30.0–36.0)
MCV: 92.4 fL (ref 78.0–100.0)
Platelets: 232 10*3/uL (ref 150–400)
RBC: 4.6 MIL/uL (ref 3.87–5.11)
RDW: 13.1 % (ref 11.5–15.5)
WBC: 10.1 10*3/uL (ref 4.0–10.5)

## 2014-08-27 LAB — TROPONIN I
Troponin I: <0.3 ng/mL (ref <0.30)
Troponin I: <0.3 ng/mL (ref <0.30)

## 2014-08-27 MED ORDER — ALPRAZOLAM 0.25 MG PO TABS
0.5000 mg | ORAL_TABLET | Freq: Once | ORAL | Status: AC
  Administered 2014-08-27: 0.5 mg via ORAL
  Filled 2014-08-27: qty 2

## 2014-08-27 MED ORDER — DICLOFENAC SODIUM 75 MG PO TBEC: 75.0000 mg | DELAYED_RELEASE_TABLET | Freq: Two times a day (BID) | ORAL | Status: DC

## 2014-08-27 NOTE — Telephone Encounter (Signed)
Patient Information:  Caller Name: Elfida  Phone: [phone]  Patient: Jenny [name], Jenny [name]  Gender: Female  DOB: [birth year]  Age: 73 Years  PCP: Penni Homans [facility])  Office Follow Up:  Does the office need to follow up with this patient?: No  Instructions For The Office: N/A  RN Note:  Per dispsoition contacted the office to give heads up, spoke with Abner Greenspan.  Symptoms  Reason For Call & Symptoms: Pt reports having 2 espisodes chest pain, sweating lasting 1 1/2 hours.  Reviewed Health History In EMR: Yes  Reviewed Medications In EMR: Yes  Reviewed Allergies In EMR: Yes  Reviewed Surgeries / Procedures: Yes  Date of Onset of Symptoms: 08/26/2014  Guideline(s) Used:  Chest Pain  Disposition Per Guideline:   Go to ED Now  Reason For Disposition Reached:   Pain also present in shoulder(s) or arm(s) or jaw  Advice Given:  N/A  Patient Will Follow Care Advice:  YES

## 2014-08-27 NOTE — Telephone Encounter (Signed)
CAN-disposition for ED concerning diaphoresis associated with atypical chest pain.

## 2014-08-27 NOTE — Discharge Instructions (Signed)

## 2014-08-27 NOTE — ED Notes (Signed)
Pt. Refused wheelchair 

## 2014-08-27 NOTE — ED Provider Notes (Signed)
CSN: [id number]     Arrival date & time 08/27/14  4128 History   First MD Initiated Contact with Patient 08/27/14 1917     Chief Complaint  Patient presents with  . Chest Pain     (Consider location/radiation/quality/duration/timing/severity/associated sxs/prior Treatment) Patient is a 73 y.o. female presenting with chest pain. The history is provided by the patient.  Chest Pain Pain location:  Substernal area Pain quality: pressure   Pain radiates to:  L jaw Pain radiates to the back: no   Pain severity:  Moderate Onset quality:  Gradual Timing:  Intermittent Progression:  Worsening Chronicity:  New Context comment:  At rest and w/ exertion Relieved by:  Rest Worsened by:  Nothing tried Ineffective treatments:  None tried Associated symptoms: no abdominal pain, no back pain, no cough, no dizziness, no fatigue, no fever, no headache, no nausea, no shortness of breath and not vomiting     Past Medical History  Diagnosis Date  . Allergy     rhinitis  . Bronchitis   . Venous insufficiency   . Obesity   . Hypothyroidism   . GERD (gastroesophageal reflux disease)   . Diverticulosis of colon   . Urinary incontinence   . Degenerative joint disease   . Lumbar back pain   . Vitamin D deficiency   . Dysthymia   . Asthma     mild, intermittent  . Transient memory loss March 2006    after a stressful event she had no memory the rest of that day  . VITAMIN D DEFICIENCY 08/21/2008  . VENOUS INSUFFICIENCY 08/23/2007  . Other urinary incontinence 03/12/2010  . OBESITY 08/23/2007  . HYPOTHYROIDISM 08/23/2007  . HAND PAIN 04/09/2010  . GERD 08/23/2007  . 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  . DIVERTICULOSIS OF COLON 02/22/2008  . DIABETES MELLITUS, BORDERLINE 08/21/2008  . DEGENERATIVE JOINT DISEASE 02/22/2008  . BRONCHITIS, RECURRENT 02/22/2008  . BACK PAIN, LUMBAR 08/23/2007  . ALLERGIC RHINITIS 08/23/2007  . Asthma 01/14/2011  . 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 History of chicken pox  01/14/2011  . History of measles 01/14/2011  . PONV (postoperative nausea and vomiting)   . Peripheral neuropathy 02/12/2011    possibly related to spondylolisthesis per pt  . Urinary frequency   . Cough     usually not productive  . Incontinence of urine   . Obstructive sleep apnea     does not use CPAP, lost weight  . SLEEP APNEA, OBSTRUCTIVE 08/23/2007  . Hypertension   . HYPERTENSION 02/22/2008  . Anemia 09/21/2011  . UTI (lower urinary tract infection) 03/23/2012  . Atrophic vaginitis 06/17/2012  . Annual physical exam 08/06/2013   Past Surgical History  Procedure Laterality Date  . Appendectomy    . Tonsillectomy and adenoidectomy    . Ap repair and sling for cystocele  8-04     Dr Gaynelle Arabian  . Left hand  2010    left thumb, joint removal, CTR  . Ctr      bilateral  . Shoulder arthroscopy distal clavicle excision and open rotator cuff repair      right  . Neck surgery      x 2 - discectomy, fusion  . Abdominal hysterectomy      took one ovary  . Cholecystectomy    . Carpal tunel  2008   Family History  Problem Relation Age of Onset  . COPD Mother   . Pneumonia Mother   . Other Mother     CHF  .  Hypertension Mother   . Arthritis Mother     septic knee after replacement  . Heart disease Mother     chf  . Dementia Father   . Arthritis Brother   . Asthma Daughter   . Cancer Maternal Aunt     breast  . Stroke Maternal Grandmother   . Heart disease Maternal Grandmother   . Other Maternal Grandfather     CHF  . Heart disease Maternal Grandfather     chf  . Cancer Paternal Grandmother     stomach?  . Other Paternal Grandfather     problems with kidneys  . Kidney disease Paternal Grandfather   . Anxiety disorder Daughter   . Cancer Maternal Aunt     liver   History  Substance Use Topics  . Smoking status: Former Smoker -- 1.50 packs/day for 30 years    Types: Cigarettes    Quit date: 10/13/1991  . Smokeless tobacco: Never Used  . Alcohol Use: No   OB  History    No data available     Review of Systems  Constitutional: Negative for fever and fatigue.  HENT: Negative for congestion and drooling.   Eyes: Negative for pain.  Respiratory: Negative for cough and shortness of breath.   Cardiovascular: Positive for chest pain.  Gastrointestinal: Negative for nausea, vomiting, abdominal pain and diarrhea.  Genitourinary: Negative for dysuria and hematuria.  Musculoskeletal: Negative for back pain, gait problem and neck pain.  Skin: Negative for color change.  Neurological: Negative for dizziness and headaches.  Hematological: Negative for adenopathy.  Psychiatric/Behavioral: Negative for behavioral problems.  All other systems reviewed and are negative.     Allergies  Prednisone and Codeine  Home Medications   Prior to Admission medications   Medication Sig Start Date End Date Taking? Authorizing Provider  ALPRAZolam Duanne Moron) 0.5 MG tablet TAKE 1 TABLET BY MOUTH EVERY DAY AS NEEDED FOR SLEEP/ANXIETY 07/26/14  Yes Mosie Lukes, MD  aspirin 81 MG chewable tablet Chew 324 mg by mouth once.   Yes Historical Provider, MD  levothyroxine (SYNTHROID, LEVOTHROID) 100 MCG tablet TAKE 1 TABLET BY MOUTH EVERY DAY BEFORE BREAKFAST 08/27/14  Yes Mosie Lukes, MD  lisinopril (PRINIVIL,ZESTRIL) 5 MG tablet TAKE 1 TABLET BY MOUTH EVERY DAY 08/27/14  Yes Mosie Lukes, MD  albuterol (PROVENTIL HFA;VENTOLIN HFA) 108 (90 BASE) MCG/ACT inhaler Inhale 2 puffs into the lungs every 6 (six) hours as needed. For wheezing 08/03/13   Mosie Lukes, MD  aspirin EC 81 MG tablet Take 81 mg by mouth daily.      Historical Provider, MD  budesonide-formoterol (SYMBICORT) 160-4.5 MCG/ACT inhaler Inhale 2 puffs into the lungs 2 (two) times daily. 08/03/13   Mosie Lukes, MD  calcium carbonate (OS-CAL) 600 MG TABS Take 600 mg by mouth 2 (two) times daily with a meal.     Historical Provider, MD  Cholecalciferol (VITAMIN D) 1000 UNITS capsule Take 1,000 Units by  mouth daily.     Historical Provider, MD  citalopram (CELEXA) 10 MG tablet Take 1 tablet (10 mg total) by mouth daily. 11/03/13   Mosie Lukes, MD  clobetasol ointment (TEMOVATE) 4.49 % Apply 1 application topically daily as needed.    Historical Provider, MD  clotrimazole-betamethasone (LOTRISONE) cream Apply 1 application topically 2 (two) times daily. 02/10/12   Mosie Lukes, MD  diclofenac (VOLTAREN) 75 MG EC tablet Take 1 tablet (75 mg total) by mouth 2 (two) times daily. 08/27/14  Meriam Sprague Saguier, PA-C  furosemide (LASIX) 40 MG tablet Take 40 mg by mouth daily as needed. For swelling   02/12/11   Mosie Lukes, MD  gabapentin (NEURONTIN) 300 MG capsule Take 1 capsule (300 mg total) by mouth 3 (three) times daily. 08/03/13   Mosie Lukes, MD  guaiFENesin (MUCINEX) 600 MG 12 hr tablet Take 1,200 mg by mouth 2 (two) times daily as needed.     Historical Provider, MD  hydrOXYzine (ATARAX/VISTARIL) 25 MG tablet Take 25 mg by mouth 3 (three) times daily as needed.    Historical Provider, MD  montelukast (SINGULAIR) 10 MG tablet Take 1 tablet (10 mg total) by mouth at bedtime. 03/09/14   Mosie Lukes, MD  ranitidine (ZANTAC) 150 MG tablet Take 150 mg by mouth daily.    Historical Provider, MD   BP 142/71 mmHg  Pulse 71  Temp(Src) 97.9 F (36.6 C) (Oral)  Resp 18  SpO2 98% Physical Exam  Constitutional: She is oriented to person, place, and time. She appears well-developed and well-nourished.  HENT:  Head: Normocephalic and atraumatic.  Mouth/Throat: Oropharynx is clear and moist. No oropharyngeal exudate.  Eyes: Conjunctivae and EOM are normal. Pupils are equal, round, and reactive to light.  Neck: Normal range of motion. Neck supple.  Cardiovascular: Normal rate, regular rhythm, normal heart sounds and intact distal pulses.  Exam reveals no gallop and no friction rub.   No murmur heard. Pulmonary/Chest: Effort normal and breath sounds normal. No respiratory distress. She has no  wheezes.  Abdominal: Soft. Bowel sounds are normal. There is no tenderness. There is no rebound and no guarding.  Musculoskeletal: Normal range of motion. She exhibits no edema or tenderness.  Neurological: She is alert and oriented to person, place, and time.  Skin: Skin is warm and dry.  Psychiatric: She has a normal mood and affect. Her behavior is normal.  Nursing note and vitals reviewed.   ED Course  Procedures (including critical care time) Labs Review Labs Reviewed  BASIC METABOLIC PANEL - Abnormal; Notable for the following:    GFR calc non Af Amer 56 (*)    GFR calc Af Amer 65 (*)    All other components within normal limits  CBC  TROPONIN I  TROPONIN I    Imaging Review Dg Chest 2 View  08/27/2014   CLINICAL DATA:  Shortness of breath and chest pain  EXAM: CHEST  2 VIEW  COMPARISON:  03/11/2011  FINDINGS: Cardiac shadow is stable. The lungs are hyperinflated consistent with COPD. No focal infiltrate or sizable effusion is seen. Mild thoracic spine degenerative changes are noted. Postsurgical changes in the cervical spine are seen.  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   Electronically Signed   By: Inez Catalina M.D.   On: 08/27/2014 20:43     EKG Interpretation   Date/Time:  Monday August 27 2014 19:27:18 EST Ventricular Rate:  69 PR Interval:  142 QRS Duration: 90 QT Interval:  392 QTC Calculation: 420 R Axis:   -44 Text Interpretation:  Sinus rhythm Left axis deviation No significant  change since last tracing Confirmed by Branden Shallenberger  MD, Rip Hawes ([id number]) on  08/27/2014 7:43:26 PM      MDM   Final diagnoses:  Chest pain    7:51 PM 73 y.o. female w hx of HTN, borderline DM, previous smoker who presents with chest pain. She states that she has had chest pain intermittently over the last month which worsened yesterday while at  rest and lasted for several hours. She describes it as a chest pressure but denies any shortness of breath. She took a Xanax  yesterday which helped. She had recurrence of symptoms twice today. Once while dumping leaves from a cart and another episode a few hours ago. She is currently asymptomatic.vital signs are unremarkable here. We'll get screening labs and imaging.  The pt had recurrence of sx w/ a text message that made her upset while here. She believes her symptoms to be related to stress and anxiety. I suspect this is true given the recurrence of her symptoms with a distressing text message on exam. We'll give her a Xanax. We had a long discussion about inpatient versus outpatient treatment. She would prefer to follow-up with her PCP as an outpatient. I think this is reasonable. We'll plan for delta troponin.  10:58 PM: I interpreted/reviewed the labs and/or imaging which were non-contributory.  Pt remains asx.  I have discussed the diagnosis/risks/treatment options with the patient and family and believe the pt to be eligible for discharge home to follow-up with her pcp tomorrow. We also discussed returning to the ED immediately if new or worsening sx occur. We discussed the sx which are most concerning (e.g., return of cp, sob, diaphoresis) that necessitate immediate return. Medications administered to the patient during their visit and any new prescriptions provided to the patient are listed below.  Medications given during this visit Medications  ALPRAZolam Duanne Moron) tablet 0.5 mg (0.5 mg Oral Given 08/27/14 2127)    New Prescriptions   No medications on file     Pamella Pert, MD 08/27/14 2331

## 2014-08-27 NOTE — ED Notes (Signed)
Pt started having chest pain/ diaphoresis yesterday while sitting. Pt took her xanax. While emptying a pile of leaves experienced the same CP today with pain radiating into left neck/jaw. Pt complaining of 4/10 chest pain for EMS. Denies N/V and HA. EKG shows SR. BP 183/99, HR 68. Pt now states pain is 0/10. Pt took 324mg   ASA at 1700. Hx HTN

## 2014-08-28 ENCOUNTER — Other Ambulatory Visit: Payer: Self-pay | Admitting: Family Medicine

## 2014-08-29 ENCOUNTER — Other Ambulatory Visit: Payer: Self-pay

## 2014-09-24 ENCOUNTER — Other Ambulatory Visit: Payer: Self-pay | Admitting: Neurosurgery

## 2014-09-24 DIAGNOSIS — M5416 Radiculopathy, lumbar region: Secondary | ICD-10-CM

## 2014-09-26 ENCOUNTER — Other Ambulatory Visit: Payer: Self-pay | Admitting: Family Medicine

## 2014-09-26 NOTE — Telephone Encounter (Signed)
ALPRAZolam (XANAX) 0.5 MG tablet TAKE 1 TABLET BY MOUTH DAILY AS NEEDED FOR SLEEP/ANXIETY Last refill: 08/28/2014 # 40, 0 refills Last OV: 04/26/14 with Mackie Pai, PA-C  No UDS on file

## 2014-09-27 ENCOUNTER — Other Ambulatory Visit: Payer: Self-pay | Admitting: Family Medicine

## 2014-09-27 MED ORDER — ALPRAZOLAM 0.5 MG PO TABS: ORAL_TABLET | ORAL | Status: DC

## 2014-09-27 NOTE — Telephone Encounter (Signed)
Script printed and placed in Dr. Frederik Pear red folder for signature. JG//CMA

## 2014-09-27 NOTE — Addendum Note (Signed)
Addended by: Murtis Sink A on: 09/27/2014 07:25 AM   Modules accepted: Orders

## 2014-09-27 NOTE — Telephone Encounter (Signed)
Rx printed and faxed to the pharmacy([facility] [place]).//AB/CMA

## 2014-10-02 ENCOUNTER — Ambulatory Visit
Admission: RE | Admit: 2014-10-02 | Discharge: 2014-10-02 | Disposition: A | Discharge status: Home / Self Care | Payer: Commercial Managed Care - HMO | Source: Ambulatory Visit | Attending: Neurosurgery | Admitting: Neurosurgery

## 2014-10-02 ENCOUNTER — Other Ambulatory Visit: Payer: Self-pay | Admitting: Neurosurgery

## 2014-10-02 DIAGNOSIS — M5416 Radiculopathy, lumbar region: Secondary | ICD-10-CM

## 2014-10-02 MED ORDER — IOHEXOL 180 MG/ML  SOLN
1.0000 mL | Freq: Once | INTRAMUSCULAR | Status: AC | PRN
  Administered 2014-10-02: 1 mL via INTRATHECAL

## 2014-10-02 MED ORDER — METHYLPREDNISOLONE ACETATE 40 MG/ML INJ SUSP (RADIOLOG
120.0000 mg | Freq: Once | INTRAMUSCULAR | Status: AC
  Administered 2014-10-02: 120 mg via EPIDURAL

## 2014-10-25 ENCOUNTER — Other Ambulatory Visit: Payer: Self-pay | Admitting: Family Medicine

## 2014-10-26 NOTE — Telephone Encounter (Signed)
Rx sent to the pharmacy by e-script.//AB/CMA 

## 2014-11-05 ENCOUNTER — Other Ambulatory Visit: Payer: Self-pay | Admitting: Family Medicine

## 2014-11-05 NOTE — Telephone Encounter (Signed)
Rx sent to the pharmacy by e-script.  Pt needs further evaluation and/or laboratory testing before further refills are given.//AB/CMA

## 2014-11-07 ENCOUNTER — Telehealth: Payer: Self-pay | Admitting: Family Medicine

## 2014-11-07 NOTE — Telephone Encounter (Signed)
She gets 40 per month but runs out prior to the end of the month.  She is due for a refill on 2-8.  She will be out by tomorrow  Could you up her rx

## 2014-11-08 NOTE — Telephone Encounter (Signed)
Can have a refill on the Alprazolam, same sig, same number no rf unless she does an UDS if she does that she can have 2 rf

## 2014-11-08 NOTE — Telephone Encounter (Signed)
Requesting Xanax 0.5mg -Take 1 tablet by mouth daily as needed for sleep/anxiety. Last refill:09/27/14;#40,0 Last OV:03/09/14 No contract or UDS Please advise.//AB/CMA

## 2014-11-08 NOTE — Telephone Encounter (Signed)
Patient calling in for this.

## 2014-11-09 ENCOUNTER — Encounter: Payer: Self-pay | Admitting: *Deleted

## 2014-11-09 MED ORDER — ALPRAZOLAM 0.5 MG PO TABS: ORAL_TABLET | ORAL | Status: DC

## 2014-11-09 NOTE — Telephone Encounter (Signed)
LMOM @ 4:29pm @ [phone]) informing the pt that her refill requesting for the Xanax has been approved and she will need to pick it up here at the office.   And also she will need to sign a Controlled Substance Contract.//AB/CMA

## 2014-12-03 ENCOUNTER — Other Ambulatory Visit: Payer: Self-pay | Admitting: Family Medicine

## 2014-12-08 ENCOUNTER — Other Ambulatory Visit: Payer: Self-pay | Admitting: Family Medicine

## 2014-12-10 ENCOUNTER — Other Ambulatory Visit: Payer: Self-pay | Admitting: Family Medicine

## 2014-12-10 ENCOUNTER — Telehealth: Payer: Self-pay | Admitting: Family Medicine

## 2014-12-10 NOTE — Telephone Encounter (Signed)
[facility] pharmacist from [facility] in [place] needs clarification on levothyroxine.  They received a refill from [place] for 112 mcg, but patient states she is taking levothyroxine 100 mcg, both are currently on her list.  I did check labs and looks like it was dropped to the 100 mcg on 11/03/13.  Advise if to stay on the levothyroxine 100 mcg I just need to let the pharmacy know which to fill. Call back number to pharmacy is [phone]

## 2014-12-10 NOTE — Telephone Encounter (Signed)
Have her stay on the 100 mcg tab and take the other out of her list please

## 2014-12-11 NOTE — Telephone Encounter (Signed)
Removed 112 mcg from her medication list.  Called the pharmacist informed she is to be on the 100 mcg and we have removed the 112 mcg from her list.  They can fill the 100 mcg.

## 2015-01-01 ENCOUNTER — Other Ambulatory Visit: Payer: Self-pay | Admitting: Family Medicine

## 2015-01-01 NOTE — Telephone Encounter (Signed)
Med filled.  

## 2015-01-03 ENCOUNTER — Other Ambulatory Visit: Payer: Self-pay | Admitting: Family Medicine

## 2015-01-03 NOTE — Telephone Encounter (Signed)
Med filled.  

## 2015-01-22 ENCOUNTER — Other Ambulatory Visit: Payer: Self-pay | Admitting: Family Medicine

## 2015-02-09 ENCOUNTER — Other Ambulatory Visit: Payer: Self-pay | Admitting: Family Medicine

## 2015-02-15 ENCOUNTER — Encounter: Payer: Self-pay | Admitting: Family Medicine

## 2015-02-15 ENCOUNTER — Ambulatory Visit (INDEPENDENT_AMBULATORY_CARE_PROVIDER_SITE_OTHER): Payer: PPO | Admitting: Family Medicine

## 2015-02-15 VITALS — BP 110/72 | HR 90 | Temp 97.6°F | Ht 65.0 in | Wt 223.4 lb

## 2015-02-15 DIAGNOSIS — K219 Gastro-esophageal reflux disease without esophagitis: Secondary | ICD-10-CM

## 2015-02-15 DIAGNOSIS — D649 Anemia, unspecified: Secondary | ICD-10-CM

## 2015-02-15 DIAGNOSIS — E039 Hypothyroidism, unspecified: Secondary | ICD-10-CM | POA: Diagnosis not present

## 2015-02-15 DIAGNOSIS — I10 Essential (primary) hypertension: Secondary | ICD-10-CM | POA: Diagnosis not present

## 2015-02-15 DIAGNOSIS — E559 Vitamin D deficiency, unspecified: Secondary | ICD-10-CM

## 2015-02-15 DIAGNOSIS — E119 Type 2 diabetes mellitus without complications: Secondary | ICD-10-CM

## 2015-02-15 DIAGNOSIS — F418 Other specified anxiety disorders: Secondary | ICD-10-CM

## 2015-02-15 DIAGNOSIS — E782 Mixed hyperlipidemia: Secondary | ICD-10-CM

## 2015-02-15 DIAGNOSIS — E1169 Type 2 diabetes mellitus with other specified complication: Secondary | ICD-10-CM

## 2015-02-15 DIAGNOSIS — E669 Obesity, unspecified: Secondary | ICD-10-CM

## 2015-02-15 MED ORDER — ESCITALOPRAM OXALATE 10 MG PO TABS: 10.0000 mg | ORAL_TABLET | Freq: Every day | ORAL | Status: DC

## 2015-02-15 MED ORDER — ALPRAZOLAM 0.5 MG PO TABS: 0.5000 mg | ORAL_TABLET | Freq: Two times a day (BID) | ORAL | Status: DC | PRN

## 2015-02-15 NOTE — Progress Notes (Signed)
Pre visit review using our clinic review tool, if applicable. No additional management support is needed unless otherwise documented below in the visit note. 

## 2015-02-15 NOTE — Progress Notes (Signed)
Jenny [name]  [id number] [birth year] 02/15/2015      Progress Note-Follow Up  Subjective  Chief Complaint  Chief Complaint  Patient presents with  . Follow-up    HPI  Patient is a 74 y.o. female in today for routine medical care. Patient is in today for follow-up. Overall doing well. Acknowledges ongoing anhedonia and anxiety. No suicidal ideation. Is struggling with significant stressors. No recent illness. Is following with psychiatry, Dr. Robina Ade, who has administered Suboxone. No acute concerns. Denies CP/palp/SOB/HA/congestion/fevers/GI or GU c/o. Taking meds as prescribed  Past Medical History  Diagnosis Date  . Allergy     rhinitis  . Bronchitis   . Venous insufficiency   . Obesity   . Hypothyroidism   . GERD (gastroesophageal reflux disease)   . Diverticulosis of colon   . Urinary incontinence   . Degenerative joint disease   . Lumbar back pain   . Vitamin D deficiency   . Dysthymia   . Asthma     mild, intermittent  . Transient memory loss March 2006    after a stressful event she had no memory the rest of that day  . VITAMIN D DEFICIENCY 08/21/2008  . VENOUS INSUFFICIENCY 08/23/2007  . Other urinary incontinence 03/12/2010  . OBESITY 08/23/2007  . HYPOTHYROIDISM 08/23/2007  . HAND PAIN 04/09/2010  . GERD 08/23/2007  . 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  . DIVERTICULOSIS OF COLON 02/22/2008  . DIABETES MELLITUS, BORDERLINE 08/21/2008  . DEGENERATIVE JOINT DISEASE 02/22/2008  . BRONCHITIS, RECURRENT 02/22/2008  . BACK PAIN, LUMBAR 08/23/2007  . ALLERGIC RHINITIS 08/23/2007  . Asthma 01/14/2011  . 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 History of chicken pox 01/14/2011  . History of measles 01/14/2011  . PONV (postoperative nausea and vomiting)   . Peripheral neuropathy 02/12/2011    possibly related to spondylolisthesis per pt  . Urinary frequency   . Cough     usually not productive  . Incontinence of urine   . Obstructive sleep apnea     does not use CPAP, lost weight  .  SLEEP APNEA, OBSTRUCTIVE 08/23/2007  . Hypertension   . HYPERTENSION 02/22/2008  . Anemia 09/21/2011  . UTI (lower urinary tract infection) 03/23/2012  . Atrophic vaginitis 06/17/2012  . Annual physical exam 08/06/2013    Past Surgical History  Procedure Laterality Date  . Appendectomy    . Tonsillectomy and adenoidectomy    . Ap repair and sling for cystocele  8-04     Dr Gaynelle Arabian  . Left hand  2010    left thumb, joint removal, CTR  . Ctr      bilateral  . Shoulder arthroscopy distal clavicle excision and open rotator cuff repair      right  . Neck surgery      x 2 - discectomy, fusion  . Abdominal hysterectomy      took one ovary  . Cholecystectomy    . Carpal tunel  2008    Family History  Problem Relation Age of Onset  . COPD Mother   . Pneumonia Mother   . Other Mother     CHF  . Hypertension Mother   . Arthritis Mother     septic knee after replacement  . Heart disease Mother     chf  . Dementia Father   . Arthritis Brother   . Asthma Daughter   . Cancer Maternal Aunt     breast  . Stroke Maternal Grandmother   . Heart disease Maternal  Grandmother   . Other Maternal Grandfather     CHF  . Heart disease Maternal Grandfather     chf  . Cancer Paternal Grandmother     stomach?  . Other Paternal Grandfather     problems with kidneys  . Kidney disease Paternal Grandfather   . Anxiety disorder Daughter   . Cancer Maternal Aunt     liver    History   Social History  . Marital Status: Widowed    Spouse Name: N/A  . Number of Children: N/A  . Years of Education: N/A   Occupational History  . Not on file.   Social History Main Topics  . Smoking status: Former Smoker -- 1.50 packs/day for 30 years    Types: Cigarettes    Quit date: 10/13/1991  . Smokeless tobacco: Never Used  . Alcohol Use: No  . Drug Use: No  . Sexual Activity: No   Other Topics Concern  . Not on file   Social History Narrative    Current Outpatient Prescriptions on  File Prior to Visit  Medication Sig Dispense Refill  . albuterol (PROVENTIL HFA;VENTOLIN HFA) 108 (90 BASE) MCG/ACT inhaler Inhale 2 puffs into the lungs every 6 (six) hours as needed. For wheezing 1 Inhaler 6  . ALPRAZolam (XANAX) 0.5 MG tablet TAKE 1 TABLET BY MOUTH DAILY AS NEEDED FOR SLEEP/ANXIETY 40 tablet 2  . aspirin EC 81 MG tablet Take 81 mg by mouth daily.      . budesonide-formoterol (SYMBICORT) 160-4.5 MCG/ACT inhaler Inhale 2 puffs into the lungs 2 (two) times daily. (Patient taking differently: Inhale 2 puffs into the lungs daily. ) 1 Inhaler 3  . clobetasol ointment (TEMOVATE) 3.30 % Apply 1 application topically daily as needed (for rash).     . gabapentin (NEURONTIN) 300 MG capsule TAKE 1 CAPSULE BY MOUTH THREE TIMES DAILY 270 capsule 0  . ibuprofen (ADVIL,MOTRIN) 200 MG tablet Take 400 mg by mouth every 6 (six) hours as needed for moderate pain.    Marland Kitchen levothyroxine (SYNTHROID, LEVOTHROID) 100 MCG tablet TAKE 1 TABLET BY MOUTH EVERY DAY BEFORE BREAKFAST 30 tablet 0  . lisinopril (PRINIVIL,ZESTRIL) 5 MG tablet TAKE 1 TABLET BY MOUTH DAILY 90 tablet 0  . [DISCONTINUED] esomeprazole (NEXIUM) 40 MG capsule Take 40 mg by mouth daily before breakfast.       No current facility-administered medications on file prior to visit.    Allergies  Allergen Reactions  . Prednisone Other (See Comments)    Confusion, dizziness Medrol is ok  . Codeine Nausea Only         Review of Systems  Review of Systems  Constitutional: Negative for fever, chills and malaise/fatigue.  HENT: Negative for congestion, hearing loss and nosebleeds.   Eyes: Negative for discharge.  Respiratory: Negative for cough, sputum production, shortness of breath and wheezing.   Cardiovascular: Negative for chest pain, palpitations and leg swelling.  Gastrointestinal: Negative for heartburn, nausea, vomiting, abdominal pain, diarrhea, constipation and blood in stool.  Genitourinary: Negative for dysuria, urgency,  frequency and hematuria.  Musculoskeletal: Negative for myalgias, back pain and falls.  Skin: Negative for rash.  Neurological: Negative for dizziness, tremors, sensory change, focal weakness, loss of consciousness, weakness and headaches.  Endo/Heme/Allergies: Negative for polydipsia. Does not bruise/bleed easily.  Psychiatric/Behavioral: Positive for depression. Negative for suicidal ideas. The patient is nervous/anxious. The patient does not have insomnia.     Objective  BP 110/72 mmHg  Pulse 90  Temp(Src) 97.6 F (36.4  C) (Oral)  Ht 5\' 5"  (1.651 m)  Wt 223 lb 6 oz (101.322 kg)  BMI 37.17 kg/m2  SpO2 95%  Physical Exam  Physical Exam  Constitutional: She is oriented to person, place, and time and well-developed, well-nourished, and in no distress. No distress.  HENT:  Head: Normocephalic and atraumatic.  Right Ear: External ear normal.  Left Ear: External ear normal.  Nose: Nose normal.  Mouth/Throat: Oropharynx is clear and moist. No oropharyngeal exudate.  Eyes: Conjunctivae are normal. Pupils are equal, round, and reactive to light. Right eye exhibits no discharge. Left eye exhibits no discharge. No scleral icterus.  Neck: Normal range of motion. Neck supple. No thyromegaly present.  Cardiovascular: Normal rate, regular rhythm, normal heart sounds and intact distal pulses.   No murmur heard. Pulmonary/Chest: Effort normal and breath sounds normal. No respiratory distress. She has no wheezes. She has no rales.  Abdominal: Soft. Bowel sounds are normal. She exhibits no distension and no mass. There is no tenderness.  Musculoskeletal: Normal range of motion. She exhibits no edema or tenderness.  Lymphadenopathy:    She has no cervical adenopathy.  Neurological: She is alert and oriented to person, place, and time. She has normal reflexes. No cranial nerve deficit. Coordination normal.  Skin: Skin is warm and dry. No rash noted. She is not diaphoretic.  Psychiatric: Mood,  memory and affect normal.    Lab Results  Component Value Date   TSH 1.257 03/09/2014   Lab Results  Component Value Date   WBC 10.1 08/27/2014   HGB 13.8 08/27/2014   HCT 42.5 08/27/2014   MCV 92.4 08/27/2014   PLT 232 08/27/2014   Lab Results  Component Value Date   CREATININE 0.97 08/27/2014   BUN 20 08/27/2014   NA 139 08/27/2014   K 5.1 08/27/2014   CL 103 08/27/2014   CO2 25 08/27/2014   Lab Results  Component Value Date   ALT 11 03/09/2014   AST 17 03/09/2014   ALKPHOS 76 03/09/2014   BILITOT 0.5 03/09/2014   Lab Results  Component Value Date   CHOL 135 03/09/2014   Lab Results  Component Value Date   HDL 36* 03/09/2014   Lab Results  Component Value Date   LDLCALC 77 03/09/2014   Lab Results  Component Value Date   TRIG 109 03/09/2014   Lab Results  Component Value Date   CHOLHDL 3.8 03/09/2014     Assessment & Plan  Anemia Increase leafy greens, consider increased lean red meat and using cast iron cookware. Continue to monitor, report any concerns. resolved   Essential hypertension Well controlled, no changes to meds. Encouraged heart healthy diet such as the DASH diet and exercise as tolerated.    GERD Avoid offending foods, start probiotics. Do not eat large meals in late evening and consider raising head of bed.    Hypothyroidism On Levothyroxine, continue to monitor   Diabetes mellitus type 2 in obese [id number] acceptable, minimize simple carbs. Increase exercise as tolerated. Continue current meds   Vitamin D deficiency Patient is started on Vitamin D supplements and will monitor   Obesity Encouraged DASH diet, decrease po intake and increase exercise as tolerated. Needs 7-8 hours of sleep nightly. Avoid trans fats, eat small, frequent meals every 4-5 hours with lean proteins, complex carbs and healthy fats. Minimize simple carbs, GMO foods.   Hyperlipidemia, mixed Encouraged heart healthy diet, increase exercise, avoid  trans fats, consider a krill oil cap daily  Depression with anxiety Lexapro and Alprazolam

## 2015-02-16 LAB — CBC
HCT: 42.4 % (ref 36.0–46.0)
Hemoglobin: 13.7 g/dL (ref 12.0–15.0)
MCH: 28.9 pg (ref 26.0–34.0)
MCHC: 32.3 g/dL (ref 30.0–36.0)
MCV: 89.5 fL (ref 78.0–100.0)
MPV: 10.3 fL (ref 8.6–12.4)
Platelets: 279 10*3/uL (ref 150–400)
RBC: 4.74 MIL/uL (ref 3.87–5.11)
RDW: 13.1 % (ref 11.5–15.5)
WBC: 8.9 10*3/uL (ref 4.0–10.5)

## 2015-02-16 LAB — COMPREHENSIVE METABOLIC PANEL
ALT: 9 U/L (ref 0–35)
AST: 16 U/L (ref 0–37)
Albumin: 3.9 g/dL (ref 3.5–5.2)
Alkaline Phosphatase: 80 U/L (ref 39–117)
BUN: 21 mg/dL (ref 6–23)
CO2: 22 mEq/L (ref 19–32)
Calcium: 9.1 mg/dL (ref 8.4–10.5)
Chloride: 104 meq/L (ref 96–112)
Creat: 0.95 mg/dL (ref 0.50–1.10)
Glucose, Bld: 92 mg/dL (ref 70–99)
Potassium: 4.9 mEq/L (ref 3.5–5.3)
Sodium: 142 mEq/L (ref 135–145)
Total Bilirubin: 0.3 mg/dL (ref 0.2–1.2)
Total Protein: 7 g/dL (ref 6.0–8.3)

## 2015-02-16 LAB — LIPID PANEL
Cholesterol: 125 mg/dL (ref 0–200)
HDL: 35 mg/dL — ABNORMAL LOW (ref >=46)
LDL Cholesterol: 54 mg/dL (ref 0–99)
Total CHOL/HDL Ratio: 3.6 Ratio
Triglycerides: 180 mg/dL — ABNORMAL HIGH (ref <150)
VLDL: 36 mg/dL (ref 0–40)

## 2015-02-16 LAB — HEMOGLOBIN A1C
Hgb A1c MFr Bld: 5.9 % — ABNORMAL HIGH (ref <5.7)
Mean Plasma Glucose: 123 mg/dL — ABNORMAL HIGH (ref <117)

## 2015-02-16 LAB — VITAMIN D 25 HYDROXY (VIT D DEFICIENCY, FRACTURES): Vit D, 25-Hydroxy: 22 ng/mL — ABNORMAL LOW (ref 30–100)

## 2015-02-16 LAB — TSH: TSH: 0.857 u[IU]/mL (ref 0.350–4.500)

## 2015-02-18 ENCOUNTER — Other Ambulatory Visit: Payer: Self-pay | Admitting: Family Medicine

## 2015-02-18 MED ORDER — VITAMIN D (ERGOCALCIFEROL) 1.25 MG (50000 UNIT) PO CAPS: 50000.0000 [IU] | ORAL_CAPSULE | ORAL | Status: DC

## 2015-03-02 ENCOUNTER — Encounter: Payer: Self-pay | Admitting: Family Medicine

## 2015-03-02 NOTE — Assessment & Plan Note (Signed)
Encouraged heart healthy diet, increase exercise, avoid trans fats, consider a krill oil cap daily 

## 2015-03-02 NOTE — Assessment & Plan Note (Signed)
On Levothyroxine, continue to monitor 

## 2015-03-02 NOTE — Assessment & Plan Note (Signed)
Encouraged DASH diet, decrease po intake and increase exercise as tolerated. Needs 7-8 hours of sleep nightly. Avoid trans fats, eat small, frequent meals every 4-5 hours with lean proteins, complex carbs and healthy fats. Minimize simple carbs, GMO foods. 

## 2015-03-02 NOTE — Assessment & Plan Note (Signed)
Increase leafy greens, consider increased lean red meat and using cast iron cookware. Continue to monitor, report any concerns. resolved 

## 2015-03-02 NOTE — Assessment & Plan Note (Signed)
Avoid offending foods, start probiotics. Do not eat large meals in late evening and consider raising head of bed.  

## 2015-03-02 NOTE — Assessment & Plan Note (Signed)
Lexapro and Alprazolam

## 2015-03-02 NOTE — Assessment & Plan Note (Signed)
Patient is started on Vitamin D supplements and will monitor

## 2015-03-02 NOTE — Assessment & Plan Note (Signed)
Well controlled, no changes to meds. Encouraged heart healthy diet such as the DASH diet and exercise as tolerated.  °

## 2015-03-02 NOTE — Assessment & Plan Note (Signed)
hgba1c acceptable, minimize simple carbs. Increase exercise as tolerated. Continue current meds 

## 2015-03-07 ENCOUNTER — Ambulatory Visit (INDEPENDENT_AMBULATORY_CARE_PROVIDER_SITE_OTHER): Payer: PPO | Admitting: Nurse Practitioner

## 2015-03-07 ENCOUNTER — Encounter: Payer: Self-pay | Admitting: Nurse Practitioner

## 2015-03-07 VITALS — BP 103/71 | HR 80 | Temp 97.8°F | Ht 65.0 in | Wt 223.0 lb

## 2015-03-07 DIAGNOSIS — J069 Acute upper respiratory infection, unspecified: Secondary | ICD-10-CM

## 2015-03-07 DIAGNOSIS — H6121 Impacted cerumen, right ear: Secondary | ICD-10-CM | POA: Diagnosis not present

## 2015-03-07 DIAGNOSIS — H6120 Impacted cerumen, unspecified ear: Secondary | ICD-10-CM | POA: Insufficient documentation

## 2015-03-07 MED ORDER — AZITHROMYCIN 250 MG PO TABS: ORAL_TABLET | ORAL | Status: DC

## 2015-03-07 MED ORDER — METHYLPREDNISOLONE 4 MG PO TBPK: ORAL_TABLET | ORAL | Status: DC

## 2015-03-07 NOTE — Patient Instructions (Addendum)
Please start medrol tomorrow morning Start azithromycin today  Continue mucinex. Drink fluids every hour.  Take delsym at night. Use cough pearles during day.  Use sinus rinse to decrease post nasal drip.  Return in 2 weeks or sooner if concerns.

## 2015-03-07 NOTE — Progress Notes (Signed)
   Subjective:    Patient ID: Jenny [name], female    DOB: 10-23-1940, 74 y.o.   MRN: [id number]  Cough This is a new problem. The current episode started 1 to 4 weeks ago (2wks). The problem has been gradually improving. The problem occurs every few minutes. The cough is non-productive. Associated symptoms include nasal congestion, postnasal drip and wheezing. Pertinent negatives include no chest pain, chills, ear congestion, ear pain, fever, headaches, sore throat or shortness of breath. The symptoms are aggravated by lying down. Treatments tried: antihistamine, mucinex, tessalon pearles. The treatment provided mild relief. Her past medical history is significant for COPD.      Review of Systems  Constitutional: Negative for fever and chills.  HENT: Positive for postnasal drip. Negative for ear pain and sore throat.   Respiratory: Positive for cough and wheezing. Negative for shortness of breath.   Cardiovascular: Negative for chest pain.  Neurological: Negative for headaches.       Objective:   Physical Exam  Constitutional: She is oriented to person, place, and time. She appears well-developed and well-nourished. No distress.  HENT:  Head: Normocephalic and atraumatic.  Left Ear: External ear normal.  Mouth/Throat: Oropharynx is clear and moist. No oropharyngeal exudate.  Ceruminosis R canal, unable to visualize TM   Eyes: Conjunctivae are normal. Right eye exhibits no discharge. Left eye exhibits no discharge.  Neck: Normal range of motion. Neck supple. No thyromegaly present.  Cardiovascular: Normal rate, regular rhythm and normal heart sounds.   No murmur heard. Pulmonary/Chest: Effort normal. No respiratory distress. She has wheezes (bilat bases, clear w/coughing).  Lymphadenopathy:    She has no cervical adenopathy.  Neurological: She is alert and oriented to person, place, and time.  Skin: Skin is warm and dry.  Psychiatric: She has a normal mood and affect. Her  behavior is normal. Thought content normal.  Vitals reviewed.         Assessment & Plan:  1. Upper respiratory infection with cough and congestion - azithromycin (ZITHROMAX) 250 MG tablet; Take 2T po day1, then 1T po days 2-5  Dispense: 6 tablet; Refill: 0 - methylPREDNISolone (MEDROL DOSEPAK) 4 MG TBPK tablet; 5T PO day 1, 4T PO day 2 & 3, 3T PO day 4, 2T po day 5,  1T PO day 6  Dispense: 19 tablet; Refill: 0  2. Ceruminosis, right Warm water flush unsuccessful currettage successful. Removed large amt of dark brown cerumen from canal. TM & cancal nontraumatic. Bones visible, TM nml. Pt tolerated well.  F/u 2 weeks or sooner if concerns.

## 2015-03-07 NOTE — Progress Notes (Signed)
Pre visit review using our clinic review tool, if applicable. No additional management support is needed unless otherwise documented below in the visit note. 

## 2015-03-12 ENCOUNTER — Other Ambulatory Visit: Payer: Self-pay | Admitting: Family Medicine

## 2015-04-08 ENCOUNTER — Other Ambulatory Visit: Payer: Self-pay

## 2015-04-12 ENCOUNTER — Telehealth: Payer: Self-pay | Admitting: Family Medicine

## 2015-04-12 NOTE — Telephone Encounter (Signed)
Caller name: Kenniya Westrich Relationship to patient: self Can be reached: [phone] Pharmacy: [facility] in [place]  Reason for call: Pt called for refill on Proair inhaler. She said pharmacy told her RX had expired. Last sent RX 07/2013. Pt said that she is almost empty on the one she has.

## 2015-04-19 ENCOUNTER — Telehealth: Payer: Self-pay | Admitting: Family Medicine

## 2015-04-19 MED ORDER — ALBUTEROL SULFATE HFA 108 (90 BASE) MCG/ACT IN AERS: 2.0000 | INHALATION_SPRAY | Freq: Four times a day (QID) | RESPIRATORY_TRACT | Status: DC | PRN

## 2015-04-19 NOTE — Telephone Encounter (Signed)
Refill done and patient informed 

## 2015-04-19 NOTE — Telephone Encounter (Signed)
Relation to pt: self  Call back number:[phone] Pharmacy: [facility] - [place], [place] - [address] [place] [address] [phone] (Phone) [phone] (Fax)         Reason for call:  Pt requesting a refill albuterol (PROVENTIL HFA;VENTOLIN HFA) 108 (90 BASE) MCG/ACT inhaler

## 2015-05-24 ENCOUNTER — Other Ambulatory Visit: Payer: Self-pay | Admitting: Family Medicine

## 2015-06-03 ENCOUNTER — Other Ambulatory Visit: Payer: Self-pay | Admitting: Family Medicine

## 2015-06-03 MED ORDER — ALPRAZOLAM 0.5 MG PO TABS: 0.5000 mg | ORAL_TABLET | Freq: Two times a day (BID) | ORAL | Status: DC | PRN

## 2015-06-03 NOTE — Telephone Encounter (Signed)
Requesting:  Alprazolam Contract 11/12/14 UDS none Last OV  02/15/15 Last Refill   #60 with 2 refills on 02/15/15  Please Advise

## 2015-06-03 NOTE — Telephone Encounter (Signed)
Once signed will fax to [facility] in [place].

## 2015-06-03 NOTE — Telephone Encounter (Signed)
Prescription has been printed by PCP and on counter for signature.

## 2015-06-25 ENCOUNTER — Encounter: Payer: Self-pay | Admitting: Family Medicine

## 2015-06-25 ENCOUNTER — Ambulatory Visit (INDEPENDENT_AMBULATORY_CARE_PROVIDER_SITE_OTHER): Payer: PPO | Admitting: Family Medicine

## 2015-06-25 ENCOUNTER — Ambulatory Visit (HOSPITAL_BASED_OUTPATIENT_CLINIC_OR_DEPARTMENT_OTHER)
Admission: RE | Admit: 2015-06-25 | Discharge: 2015-06-25 | Disposition: A | Discharge status: Home / Self Care | Payer: PPO | Source: Ambulatory Visit | Attending: Family Medicine | Admitting: Family Medicine

## 2015-06-25 VITALS — BP 124/80 | HR 79 | Temp 98.5°F | Ht 65.0 in | Wt 219.6 lb

## 2015-06-25 DIAGNOSIS — J069 Acute upper respiratory infection, unspecified: Secondary | ICD-10-CM | POA: Diagnosis not present

## 2015-06-25 DIAGNOSIS — E669 Obesity, unspecified: Secondary | ICD-10-CM

## 2015-06-25 DIAGNOSIS — R739 Hyperglycemia, unspecified: Secondary | ICD-10-CM

## 2015-06-25 DIAGNOSIS — E782 Mixed hyperlipidemia: Secondary | ICD-10-CM

## 2015-06-25 DIAGNOSIS — J209 Acute bronchitis, unspecified: Secondary | ICD-10-CM

## 2015-06-25 DIAGNOSIS — R062 Wheezing: Secondary | ICD-10-CM | POA: Insufficient documentation

## 2015-06-25 DIAGNOSIS — R05 Cough: Secondary | ICD-10-CM | POA: Insufficient documentation

## 2015-06-25 DIAGNOSIS — J42 Unspecified chronic bronchitis: Secondary | ICD-10-CM

## 2015-06-25 DIAGNOSIS — I10 Essential (primary) hypertension: Secondary | ICD-10-CM

## 2015-06-25 DIAGNOSIS — E039 Hypothyroidism, unspecified: Secondary | ICD-10-CM

## 2015-06-25 MED ORDER — GABAPENTIN 300 MG PO CAPS: 300.0000 mg | ORAL_CAPSULE | Freq: Three times a day (TID) | ORAL | Status: DC

## 2015-06-25 MED ORDER — BENZONATATE 100 MG PO CAPS: 100.0000 mg | ORAL_CAPSULE | Freq: Two times a day (BID) | ORAL | Status: DC | PRN

## 2015-06-25 MED ORDER — AMOXICILLIN-POT CLAVULANATE 875-125 MG PO TABS: 1.0000 | ORAL_TABLET | Freq: Two times a day (BID) | ORAL | Status: DC

## 2015-06-25 MED ORDER — METHYLPREDNISOLONE 4 MG PO TBPK: ORAL_TABLET | ORAL | Status: DC

## 2015-06-25 NOTE — Patient Instructions (Signed)
Probiotic daily such as [employer]age or [facility] for next month Elderberry liquid to soothe throat   Acute Bronchitis Bronchitis is inflammation of the airways that extend from the windpipe into the lungs (bronchi). The inflammation often causes mucus to develop. This leads to a cough, which is the most common symptom of bronchitis.  In acute bronchitis, the condition usually develops suddenly and goes away over time, usually in a couple weeks. Smoking, allergies, and asthma can make bronchitis worse. Repeated episodes of bronchitis may cause further lung problems.  CAUSES Acute bronchitis is most often caused by the same virus that causes a cold. The virus can spread from person to person (contagious) through coughing, sneezing, and touching contaminated objects. SIGNS AND SYMPTOMS   Cough.   Fever.   Coughing up mucus.   Body aches.   Chest congestion.   Chills.   Shortness of breath.   Sore throat.  DIAGNOSIS  Acute bronchitis is usually diagnosed through a physical exam. Your health care provider will also ask you questions about your medical history. Tests, such as chest X-rays, are sometimes done to rule out other conditions.  TREATMENT  Acute bronchitis usually goes away in a couple weeks. Oftentimes, no medical treatment is necessary. Medicines are sometimes given for relief of fever or cough. Antibiotic medicines are usually not needed but may be prescribed in certain situations. In some cases, an inhaler may be recommended to help reduce shortness of breath and control the cough. A cool mist vaporizer may also be used to help thin bronchial secretions and make it easier to clear the chest.  HOME CARE INSTRUCTIONS  Get plenty of rest.   Drink enough fluids to keep your urine clear or pale yellow (unless you have a medical condition that requires fluid restriction). Increasing fluids may help thin your respiratory secretions (sputum) and reduce  chest congestion, and it will prevent dehydration.   Take medicines only as directed by your health care provider.  If you were prescribed an antibiotic medicine, finish it all even if you start to feel better.  Avoid smoking and secondhand smoke. Exposure to cigarette smoke or irritating chemicals will make bronchitis worse. If you are a smoker, consider using nicotine gum or skin patches to help control withdrawal symptoms. Quitting smoking will help your lungs heal faster.   Reduce the chances of another bout of acute bronchitis by washing your hands frequently, avoiding people with cold symptoms, and trying not to touch your hands to your mouth, nose, or eyes.   Keep all follow-up visits as directed by your health care provider.  SEEK MEDICAL CARE IF: Your symptoms do not improve after 1 week of treatment.  SEEK IMMEDIATE MEDICAL CARE IF:  You develop an increased fever or chills.   You have chest pain.   You have severe shortness of breath.  You have bloody sputum.   You develop dehydration.  You faint or repeatedly feel like you are going to pass out.  You develop repeated vomiting.  You develop a severe headache. MAKE SURE YOU:   Understand these instructions.  Will watch your condition.  Will get help right away if you are not doing well or get worse. Document Released: 11/05/2004 Document Revised: 02/12/2014 Document Reviewed: 03/21/2013 [facility] Patient Information 2015 [place], [place]. This information is not intended to replace advice given to you by your health care provider. Make sure you discuss any questions you have with your health care provider.

## 2015-06-25 NOTE — Progress Notes (Signed)
Pre visit review using our clinic review tool, if applicable. No additional management support is needed unless otherwise documented below in the visit note. 

## 2015-06-26 ENCOUNTER — Telehealth: Payer: Self-pay | Admitting: Family Medicine

## 2015-06-26 NOTE — Telephone Encounter (Signed)
Pt notified of stable cxr results. No questions or concerns at this time.

## 2015-06-26 NOTE — Telephone Encounter (Signed)
Pt calling to check on xray results. Advised they had not been reviewed by Dr. Charlett Blake and we will call when they have.

## 2015-07-07 ENCOUNTER — Encounter: Payer: Self-pay | Admitting: Family Medicine

## 2015-07-07 DIAGNOSIS — R739 Hyperglycemia, unspecified: Secondary | ICD-10-CM | POA: Insufficient documentation

## 2015-07-07 HISTORY — DX: Hyperglycemia, unspecified: R73.9

## 2015-07-07 NOTE — Assessment & Plan Note (Signed)
Well controlled, no changes to meds. Encouraged heart healthy diet such as the DASH diet and exercise as tolerated.  °

## 2015-07-07 NOTE — Assessment & Plan Note (Signed)
Encouraged DASH diet, decrease po intake and increase exercise as tolerated. Needs 7-8 hours of sleep nightly. Avoid trans fats, eat small, frequent meals every 4-5 hours with lean proteins, complex carbs and healthy fats. Minimize simple carbs 

## 2015-07-07 NOTE — Assessment & Plan Note (Signed)
Encouraged heart healthy diet, increase exercise, avoid trans fats, consider a krill oil cap daily 

## 2015-07-07 NOTE — Assessment & Plan Note (Signed)
On Levothyroxine, continue to monitor 

## 2015-07-07 NOTE — Assessment & Plan Note (Signed)
Started on Augmentin, probiotics, Mucinex

## 2015-07-07 NOTE — Progress Notes (Signed)
Subjective:    Patient ID: Jenny [name], female    DOB: 12/28/1940, 74 y.o.   MRN: [id number]  Chief Complaint  Patient presents with  . Wheezing  . Cough    HPI Patient is in today for follow-up. Has been struggling with intermittent bronchitis for over a month. Has been treated with Abx and Medrol with some improvement only to have symptoms return. At this point she has no fevers but continues to cough sometimes productive but usually dry. Denies CP/palp/SOB/HA/congestion/GI or GU c/o. Taking meds as prescribed  Past Medical History  Diagnosis Date  . Allergy     rhinitis  . Bronchitis   . Venous insufficiency   . Obesity   . Hypothyroidism   . GERD (gastroesophageal reflux disease)   . Diverticulosis of colon   . Urinary incontinence   . Degenerative joint disease   . Lumbar back pain   . Vitamin D deficiency   . Dysthymia   . Asthma     mild, intermittent  . Transient memory loss March 2006    after a stressful event she had no memory the rest of that day  . VITAMIN D DEFICIENCY 08/21/2008  . VENOUS INSUFFICIENCY 08/23/2007  . Other urinary incontinence 03/12/2010  . OBESITY 08/23/2007  . HYPOTHYROIDISM 08/23/2007  . HAND PAIN 04/09/2010  . GERD 08/23/2007  . 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  . DIVERTICULOSIS OF COLON 02/22/2008  . DIABETES MELLITUS, BORDERLINE 08/21/2008  . DEGENERATIVE JOINT DISEASE 02/22/2008  . BRONCHITIS, RECURRENT 02/22/2008  . BACK PAIN, LUMBAR 08/23/2007  . ALLERGIC RHINITIS 08/23/2007  . Asthma 01/14/2011  . 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 History of chicken pox 01/14/2011  . History of measles 01/14/2011  . PONV (postoperative nausea and vomiting)   . Peripheral neuropathy 02/12/2011    possibly related to spondylolisthesis per pt  . Urinary frequency   . Cough     usually not productive  . Incontinence of urine   . Obstructive sleep apnea     does not use CPAP, lost weight  . SLEEP APNEA, OBSTRUCTIVE 08/23/2007  . Hypertension   .  HYPERTENSION 02/22/2008  . Anemia 09/21/2011  . UTI (lower urinary tract infection) 03/23/2012  . Atrophic vaginitis 06/17/2012  . Annual physical exam 08/06/2013  . Depression with anxiety 03/06/2008    Qualifier: Diagnosis of  By: Lenna Gilford MD, Deborra Medina   . Hyperglycemia 07/07/2015    Past Surgical History  Procedure Laterality Date  . Appendectomy    . Tonsillectomy and adenoidectomy    . Ap repair and sling for cystocele  8-04     Dr Gaynelle Arabian  . Left hand  2010    left thumb, joint removal, [facility]  . Ctr      bilateral  . Shoulder arthroscopy distal clavicle excision and open rotator cuff repair      right  . Neck surgery      x 2 - discectomy, fusion  . Abdominal hysterectomy      took one ovary  . Cholecystectomy    . Carpal tunel  2008    Family History  Problem Relation Age of Onset  . COPD Mother   . Pneumonia Mother   . Other Mother     CHF  . Hypertension Mother   . Arthritis Mother     septic knee after replacement  . Heart disease Mother     chf  . Dementia Father   . Arthritis Brother   . Asthma  Daughter   . Cancer Maternal Aunt     breast  . Stroke Maternal Grandmother   . Heart disease Maternal Grandmother   . Other Maternal Grandfather     CHF  . Heart disease Maternal Grandfather     chf  . Cancer Paternal Grandmother     stomach?  . Other Paternal Grandfather     problems with kidneys  . Kidney disease Paternal Grandfather   . Anxiety disorder Daughter   . Cancer Maternal Aunt     liver    Social History   Social History  . Marital Status: Widowed    Spouse Name: N/A  . Number of Children: N/A  . Years of Education: N/A   Occupational History  . Not on file.   Social History Main Topics  . Smoking status: Former Smoker -- 1.50 packs/day for 30 years    Types: Cigarettes    Quit date: 10/13/1991  . Smokeless tobacco: Never Used  . Alcohol Use: No  . Drug Use: No  . Sexual Activity: No   Other Topics Concern  . Not on file    Social History Narrative    Outpatient Prescriptions Prior to Visit  Medication Sig Dispense Refill  . albuterol (PROVENTIL HFA;VENTOLIN HFA) 108 (90 BASE) MCG/ACT inhaler Inhale 2 puffs into the lungs every 6 (six) hours as needed. For wheezing 1 Inhaler 6  . ALPRAZolam (XANAX) 0.5 MG tablet Take 1 tablet (0.5 mg total) by mouth 2 (two) times daily as needed for anxiety or sleep. TAKE 1 TABLET BY MOUTH DAILY AS NEEDED FOR SLEEP/ANXIETY 60 tablet 2  . aspirin EC 81 MG tablet Take 81 mg by mouth daily.      . budesonide-formoterol (SYMBICORT) 160-4.5 MCG/ACT inhaler Inhale 2 puffs into the lungs 2 (two) times daily. (Patient taking differently: Inhale 2 puffs into the lungs daily. ) 1 Inhaler 3  . ibuprofen (ADVIL,MOTRIN) 200 MG tablet Take 400 mg by mouth every 6 (six) hours as needed for moderate pain.    Marland Kitchen levothyroxine (SYNTHROID, LEVOTHROID) 100 MCG tablet TAKE 1 TABLET BY MOUTH EVERY DAY BEFORE BREAKFAST 90 tablet 0  . lisinopril (PRINIVIL,ZESTRIL) 5 MG tablet TAKE 1 TABLET BY MOUTH DAILY 90 tablet 2  . gabapentin (NEURONTIN) 300 MG capsule TAKE 1 CAPSULE BY MOUTH THREE TIMES DAILY 270 capsule 0  . clobetasol ointment (TEMOVATE) 7.51 % Apply 1 application topically daily as needed (for rash).     Marland Kitchen escitalopram (LEXAPRO) 10 MG tablet Take 1 tablet (10 mg total) by mouth daily. (Patient not taking: Reported on 06/25/2015)    . azithromycin (ZITHROMAX) 250 MG tablet Take 2T po day1, then 1T po days 2-5 6 tablet 0  . methylPREDNISolone (MEDROL DOSEPAK) 4 MG TBPK tablet 5T PO day 1, 4T PO day 2 & 3, 3T PO day 4, 2T po day 5,  1T PO day 6 19 tablet 0  . Vitamin D, Ergocalciferol, (DRISDOL) 50000 UNITS CAPS capsule Take 1 capsule (50,000 Units total) by mouth every 7 (seven) days. 4 capsule 4   No facility-administered medications prior to visit.    Allergies  Allergen Reactions  . Prednisone Other (See Comments)    Confusion, dizziness Medrol is ok  . Codeine Nausea Only          Review of Systems  Constitutional: Negative for fever and malaise/fatigue.  HENT: Negative for congestion.   Eyes: Negative for discharge.  Respiratory: Negative for shortness of breath.   Cardiovascular: Negative for  chest pain, palpitations and leg swelling.  Gastrointestinal: Negative for nausea and abdominal pain.  Genitourinary: Negative for dysuria.  Musculoskeletal: Negative for falls.  Skin: Negative for rash.  Neurological: Negative for loss of consciousness and headaches.  Endo/Heme/Allergies: Negative for environmental allergies.  Psychiatric/Behavioral: Negative for depression. The patient is not nervous/anxious.        Objective:    Physical Exam  Constitutional: She is oriented to person, place, and time. She appears well-developed and well-nourished. No distress.  HENT:  Head: Normocephalic and atraumatic.  Nose: Nose normal.  Eyes: Right eye exhibits no discharge. Left eye exhibits no discharge.  Neck: Normal range of motion. Neck supple.  Cardiovascular: Normal rate and regular rhythm.   No murmur heard. Pulmonary/Chest: Effort normal and breath sounds normal.  Abdominal: Soft. Bowel sounds are normal. There is no tenderness.  Musculoskeletal: She exhibits no edema.  Neurological: She is alert and oriented to person, place, and time.  Skin: Skin is warm and dry.  Psychiatric: She has a normal mood and affect.  Nursing note and vitals reviewed.   BP 124/80 mmHg  Pulse 79  Temp(Src) 98.5 F (36.9 C) (Oral)  Ht 5\' 5"  (1.651 m)  Wt 219 lb 9.6 oz (99.61 kg)  BMI 36.54 kg/m2  SpO2 99% Wt Readings from Last 3 Encounters:  06/25/15 219 lb 9.6 oz (99.61 kg)  03/07/15 223 lb (101.152 kg)  02/15/15 223 lb 6 oz (101.322 kg)     Lab Results  Component Value Date   WBC 8.9 02/15/2015   HGB 13.7 02/15/2015   HCT 42.4 02/15/2015   PLT 279 02/15/2015   GLUCOSE 92 02/15/2015   CHOL 125 02/15/2015   TRIG 180* 02/15/2015   HDL 35* 02/15/2015   LDLCALC  54 02/15/2015   ALT 9 02/15/2015   AST 16 02/15/2015   NA 142 02/15/2015   K 4.9 02/15/2015   CL 104 02/15/2015   CREATININE 0.95 02/15/2015   BUN 21 02/15/2015   CO2 22 02/15/2015   TSH 0.857 02/15/2015   HGBA1C 5.9* 02/15/2015   MICROALBUR 1.3 01/14/2011    Lab Results  Component Value Date   TSH 0.857 02/15/2015   Lab Results  Component Value Date   WBC 8.9 02/15/2015   HGB 13.7 02/15/2015   HCT 42.4 02/15/2015   MCV 89.5 02/15/2015   PLT 279 02/15/2015   Lab Results  Component Value Date   NA 142 02/15/2015   K 4.9 02/15/2015   CO2 22 02/15/2015   GLUCOSE 92 02/15/2015   BUN 21 02/15/2015   CREATININE 0.95 02/15/2015   BILITOT 0.3 02/15/2015   ALKPHOS 80 02/15/2015   AST 16 02/15/2015   ALT 9 02/15/2015   PROT 7.0 02/15/2015   ALBUMIN 3.9 02/15/2015   CALCIUM 9.1 02/15/2015   ANIONGAP 11 08/27/2014   GFR 60.34 02/08/2013   Lab Results  Component Value Date   CHOL 125 02/15/2015   Lab Results  Component Value Date   HDL 35* 02/15/2015   Lab Results  Component Value Date   LDLCALC 54 02/15/2015   Lab Results  Component Value Date   TRIG 180* 02/15/2015   Lab Results  Component Value Date   CHOLHDL 3.6 02/15/2015   Lab Results  Component Value Date   HGBA1C 5.9* 02/15/2015       Assessment & Plan:   Problem List Items Addressed This Visit    Upper respiratory infection with cough and congestion - Primary   Relevant  Medications   benzonatate (TESSALON) 100 MG capsule   methylPREDNISolone (MEDROL DOSEPAK) 4 MG TBPK tablet   amoxicillin-clavulanate (AUGMENTIN) 875-125 MG per tablet   Other Relevant Orders   DG Chest 2 View (Completed)   Obesity    Encouraged DASH diet, decrease po intake and increase exercise as tolerated. Needs 7-8 hours of sleep nightly. Avoid trans fats, eat small, frequent meals every 4-5 hours with lean proteins, complex carbs and healthy fats. Minimize simple carbs      Hypothyroidism    On Levothyroxine,  continue to monitor      Hyperlipidemia, mixed    Encouraged heart healthy diet, increase exercise, avoid trans fats, consider a krill oil cap daily      Hyperglycemia    hgba1c acceptable, minimize simple carbs. Increase exercise as tolerated.       Essential hypertension    Well controlled, no changes to meds. Encouraged heart healthy diet such as the DASH diet and exercise as tolerated.       Chronic bronchitis    Started on Augmentin, probiotics, Mucinex        Other Visit Diagnoses    Acute bronchitis, unspecified organism        Relevant Medications    benzonatate (TESSALON) 100 MG capsule    methylPREDNISolone (MEDROL DOSEPAK) 4 MG TBPK tablet    amoxicillin-clavulanate (AUGMENTIN) 875-125 MG per tablet    Other Relevant Orders    DG Chest 2 View (Completed)       I have discontinued Ms. Souffrant's Vitamin D (Ergocalciferol) and azithromycin. I have also changed her methylPREDNISolone and gabapentin. Additionally, I am having her start on benzonatate and amoxicillin-clavulanate. Lastly, I am having her maintain her aspirin EC, clobetasol ointment, budesonide-formoterol, ibuprofen, escitalopram, levothyroxine, albuterol, lisinopril, ALPRAZolam, and montelukast.  Meds ordered this encounter  Medications  . montelukast (SINGULAIR) 10 MG tablet    Sig: Take 10 mg by mouth at bedtime.  . benzonatate (TESSALON) 100 MG capsule    Sig: Take 1 capsule (100 mg total) by mouth 2 (two) times daily as needed for cough.    Dispense:  20 capsule    Refill:  0  . methylPREDNISolone (MEDROL DOSEPAK) 4 MG TBPK tablet    Sig: 5T PO day 1, 2 & 3, 4T PO day 4, and 5, 3T PO day 6 & 7, 2T po day 8 & 9,  1T PO day 10 & 11, 1/2 tab po daily x day 12 & 13    Dispense:  36 tablet    Refill:  0  . amoxicillin-clavulanate (AUGMENTIN) 875-125 MG per tablet    Sig: Take 1 tablet by mouth 2 (two) times daily.    Dispense:  20 tablet    Refill:  0  . gabapentin (NEURONTIN) 300 MG capsule     Sig: Take 1 capsule (300 mg total) by mouth 3 (three) times daily.    Dispense:  270 capsule    Refill:  1    **Patient requests 90 days supply**     Penni Homans, MD

## 2015-07-07 NOTE — Assessment & Plan Note (Signed)
hgba1c acceptable, minimize simple carbs. Increase exercise as tolerated.  

## 2015-07-15 ENCOUNTER — Other Ambulatory Visit: Payer: Self-pay | Admitting: Family Medicine

## 2015-07-15 MED ORDER — LEVOTHYROXINE SODIUM 100 MCG PO TABS: ORAL_TABLET | ORAL | Status: DC

## 2015-09-02 ENCOUNTER — Other Ambulatory Visit: Payer: Self-pay | Admitting: Family Medicine

## 2015-09-02 MED ORDER — ALPRAZOLAM 0.5 MG PO TABS: 0.5000 mg | ORAL_TABLET | Freq: Two times a day (BID) | ORAL | Status: DC | PRN

## 2015-09-02 NOTE — Telephone Encounter (Signed)
Requesting:   Alprazolam Contract   Signed 11/12/2014 UDS   None Last OV   06/25/15 Last Refill   06/25/15   #60 with 2 refills.  Please Advise--fax refill into [facility] [phone]

## 2015-09-03 NOTE — Telephone Encounter (Signed)
Faxed hardcopy for Alprazolam to [facility]

## 2015-10-16 ENCOUNTER — Ambulatory Visit: Payer: PPO | Admitting: Physician Assistant

## 2015-10-31 ENCOUNTER — Other Ambulatory Visit: Payer: Self-pay | Admitting: Family Medicine

## 2015-10-31 DIAGNOSIS — J209 Acute bronchitis, unspecified: Secondary | ICD-10-CM

## 2015-10-31 DIAGNOSIS — J069 Acute upper respiratory infection, unspecified: Secondary | ICD-10-CM

## 2015-11-01 ENCOUNTER — Ambulatory Visit (INDEPENDENT_AMBULATORY_CARE_PROVIDER_SITE_OTHER): Payer: PPO | Admitting: Physician Assistant

## 2015-11-01 ENCOUNTER — Telehealth: Payer: Self-pay | Admitting: Family Medicine

## 2015-11-01 ENCOUNTER — Encounter: Payer: Self-pay | Admitting: Physician Assistant

## 2015-11-01 VITALS — BP 161/91 | HR 82 | Temp 98.1°F | Ht 65.0 in | Wt 217.4 lb

## 2015-11-01 DIAGNOSIS — R5383 Other fatigue: Secondary | ICD-10-CM | POA: Diagnosis not present

## 2015-11-01 DIAGNOSIS — R399 Unspecified symptoms and signs involving the genitourinary system: Secondary | ICD-10-CM

## 2015-11-01 LAB — COMPREHENSIVE METABOLIC PANEL
ALT: 10 U/L (ref 0–35)
AST: 15 U/L (ref 0–37)
Albumin: 4.1 g/dL (ref 3.5–5.2)
Alkaline Phosphatase: 81 U/L (ref 39–117)
BUN: 22 mg/dL (ref 6–23)
CO2: 27 meq/L (ref 19–32)
Calcium: 9.5 mg/dL (ref 8.4–10.5)
Chloride: 104 mEq/L (ref 96–112)
Creatinine, Ser: 1.06 mg/dL (ref 0.40–1.20)
GFR: 53.43 mL/min — ABNORMAL LOW (ref >60.00)
Glucose, Bld: 124 mg/dL — ABNORMAL HIGH (ref 70–99)
Potassium: 4.5 mEq/L (ref 3.5–5.1)
Sodium: 138 mEq/L (ref 135–145)
Total Bilirubin: 0.5 mg/dL (ref 0.2–1.2)
Total Protein: 7.4 g/dL (ref 6.0–8.3)

## 2015-11-01 LAB — CBC
HCT: 45.5 % (ref 36.0–46.0)
Hemoglobin: 15.1 g/dL — ABNORMAL HIGH (ref 12.0–15.0)
MCHC: 33.2 g/dL (ref 30.0–36.0)
MCV: 89.3 fl (ref 78.0–100.0)
Platelets: 289 10*3/uL (ref 150.0–400.0)
RBC: 5.09 Mil/uL (ref 3.87–5.11)
RDW: 13.3 % (ref 11.5–15.5)
WBC: 8.2 10*3/uL (ref 4.0–10.5)

## 2015-11-01 LAB — HEMOGLOBIN A1C: Hgb A1c MFr Bld: 5.8 % (ref 4.6–6.5)

## 2015-11-01 LAB — POC URINALSYSI DIPSTICK (AUTOMATED)
Bilirubin, UA: NEGATIVE
Blood, UA: NEGATIVE
Glucose, UA: NEGATIVE
Ketones, UA: NEGATIVE
Nitrite, UA: NEGATIVE
Protein, UA: NEGATIVE
Spec Grav, UA: 1.015
Urobilinogen, UA: 0.2
pH, UA: 6.5

## 2015-11-01 LAB — TSH: TSH: 2.28 u[IU]/mL (ref 0.35–4.50)

## 2015-11-01 MED ORDER — METHYLPREDNISOLONE 4 MG PO TBPK: ORAL_TABLET | ORAL | Status: DC

## 2015-11-01 MED ORDER — CEPHALEXIN 500 MG PO CAPS: 500.0000 mg | ORAL_CAPSULE | Freq: Two times a day (BID) | ORAL | Status: DC

## 2015-11-01 NOTE — Telephone Encounter (Signed)
Caller name: Gershon Mussel  Relationship to patient: [facility]   Can be reached: [phone]  Pharmacy:   Reason for call: He says that a Rx for Medrol dose pak 4 mg - wrote for 3 packs, He would like to make sure of the dosage and MG. Please call back to advise further.

## 2015-11-01 NOTE — Progress Notes (Signed)
Patient presents to clinic today with multiple complaints.  Patient c/o urinary frequency and urgency over the past week associated with mild low back pain. Denies hematuria, fever, chills, nausea or vomiting. Denies flank pain. Endorses history of UTI.  Patient also c/o fatigue worsening over the past couple of months. Patient denies fever, chills, sweats. Denies cold or URI symptoms. HAs history of COPD and OSA. Is not wearing her CPAP machine at night. Does endorse restless sleep. Denies history of anemia or vitamin deficiency. Has history of hypothyroidism with last TSH check euthyroid. Is taking medication as directed. Also endorses some depressed mood since the death of her mother. Was previously prescribed Lexapro for this (has dx of dysthymia in chart) but has not taken. Denies SI/HI but does again note changes in sleep as well as anhedonia. Also notes she has been working > 40 hours per week and is working on cutting back to rest more.  Past Medical History  Diagnosis Date  . Allergy     rhinitis  . Bronchitis   . Venous insufficiency   . Obesity   . Hypothyroidism   . GERD (gastroesophageal reflux disease)   . Diverticulosis of colon   . Urinary incontinence   . Degenerative joint disease   . Lumbar back pain   . Vitamin D deficiency   . Dysthymia   . Asthma     mild, intermittent  . Transient memory loss March 2006    after a stressful event she had no memory the rest of that day  . VITAMIN D DEFICIENCY 08/21/2008  . VENOUS INSUFFICIENCY 08/23/2007  . Other urinary incontinence 03/12/2010  . OBESITY 08/23/2007  . HYPOTHYROIDISM 08/23/2007  . HAND PAIN 04/09/2010  . GERD 08/23/2007  . 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  . DIVERTICULOSIS OF COLON 02/22/2008  . DIABETES MELLITUS, BORDERLINE 08/21/2008  . DEGENERATIVE JOINT DISEASE 02/22/2008  . BRONCHITIS, RECURRENT 02/22/2008  . BACK PAIN, LUMBAR 08/23/2007  . ALLERGIC RHINITIS 08/23/2007  . Asthma 01/14/2011  . Anaphylaxis due to food  01/14/2011  . History of chicken pox 01/14/2011  . History of measles 01/14/2011  . PONV (postoperative nausea and vomiting)   . Peripheral neuropathy ([place]) 02/12/2011    possibly related to spondylolisthesis per pt  . Urinary frequency   . Cough     usually not productive  . Incontinence of urine   . Obstructive sleep apnea     does not use CPAP, lost weight  . SLEEP APNEA, OBSTRUCTIVE 08/23/2007  . Hypertension   . HYPERTENSION 02/22/2008  . Anemia 09/21/2011  . UTI (lower urinary tract infection) 03/23/2012  . Atrophic vaginitis 06/17/2012  . Annual physical exam 08/06/2013  . Depression with anxiety 03/06/2008    Qualifier: Diagnosis of  By: Lenna Gilford MD, Deborra Medina   . Hyperglycemia 07/07/2015    Current Outpatient Prescriptions on File Prior to Visit  Medication Sig Dispense Refill  . albuterol (PROVENTIL HFA;VENTOLIN HFA) 108 (90 BASE) MCG/ACT inhaler Inhale 2 puffs into the lungs every 6 (six) hours as needed. For wheezing 1 Inhaler 6  . ALPRAZolam (XANAX) 0.5 MG tablet Take 1 tablet (0.5 mg total) by mouth 2 (two) times daily as needed for anxiety or sleep. TAKE 1 TABLET BY MOUTH DAILY AS NEEDED FOR SLEEP/ANXIETY 60 tablet 2  . aspirin EC 81 MG tablet Take 81 mg by mouth daily.      . budesonide-formoterol (SYMBICORT) 160-4.5 MCG/ACT inhaler Inhale 2 puffs into the lungs 2 (two) times daily. (Patient taking differently:  Inhale 2 puffs into the lungs daily. ) 1 Inhaler 3  . gabapentin (NEURONTIN) 300 MG capsule Take 1 capsule (300 mg total) by mouth 3 (three) times daily. 270 capsule 1  . ibuprofen (ADVIL,MOTRIN) 200 MG tablet Take 400 mg by mouth every 6 (six) hours as needed for moderate pain.    Marland Kitchen levothyroxine (SYNTHROID, LEVOTHROID) 100 MCG tablet TAKE 1 TABLET BY MOUTH EVERY DAY BEFORE BREAKFAST 90 tablet 1  . lisinopril (PRINIVIL,ZESTRIL) 5 MG tablet TAKE 1 TABLET BY MOUTH DAILY 90 tablet 2  . montelukast (SINGULAIR) 10 MG tablet Take 10 mg by mouth at bedtime.    . [DISCONTINUED]  esomeprazole (NEXIUM) 40 MG capsule Take 40 mg by mouth daily before breakfast.       No current facility-administered medications on file prior to visit.    Allergies  Allergen Reactions  . Prednisone Other (See Comments)    Confusion, dizziness Medrol is ok  . Codeine Nausea Only         Family History  Problem Relation Age of Onset  . COPD Mother   . Pneumonia Mother   . Other Mother     CHF  . Hypertension Mother   . Arthritis Mother     septic knee after replacement  . Heart disease Mother     chf  . Dementia Father   . Arthritis Brother   . Asthma Daughter   . Cancer Maternal Aunt     breast  . Stroke Maternal Grandmother   . Heart disease Maternal Grandmother   . Other Maternal Grandfather     CHF  . Heart disease Maternal Grandfather     chf  . Cancer Paternal Grandmother     stomach?  . Other Paternal Grandfather     problems with kidneys  . Kidney disease Paternal Grandfather   . Anxiety disorder Daughter   . Cancer Maternal Aunt     liver    Social History   Social History  . Marital Status: Widowed    Spouse Name: N/A  . Number of Children: N/A  . Years of Education: N/A   Social History Main Topics  . Smoking status: Former Smoker -- 1.50 packs/day for 30 years    Types: Cigarettes    Quit date: 10/13/1991  . Smokeless tobacco: Never Used  . Alcohol Use: No  . Drug Use: No  . Sexual Activity: No   Other Topics Concern  . None   Social History Narrative   Review of Systems - See HPI.  All other ROS are negative.  BP 161/91 mmHg  Pulse 82  Temp(Src) 98.1 F (36.7 C) (Oral)  Ht 5' 5"  (1.651 m)  Wt 217 lb 6.4 oz (98.612 kg)  BMI 36.18 kg/m2  SpO2 97%  Physical Exam  Constitutional: She is oriented to person, place, and time and well-developed, well-nourished, and in no distress.  HENT:  Head: Normocephalic and atraumatic.  Eyes: Conjunctivae are normal.  Neck: Neck supple.  Cardiovascular: Normal rate, regular rhythm,  normal heart sounds and intact distal pulses.   Pulmonary/Chest: Effort normal and breath sounds normal. No respiratory distress. She has no wheezes. She has no rales. She exhibits no tenderness.  Abdominal: Soft. Bowel sounds are normal. She exhibits no distension and no mass. There is no tenderness. There is no rebound, no guarding and no CVA tenderness.  Lymphadenopathy:    She has no cervical adenopathy.  Neurological: She is alert and oriented to person, place, and time.  Skin: Skin is warm and dry. No rash noted.  Psychiatric: Affect normal.  Vitals reviewed.   Recent Results (from the past 2160 hour(s))  CBC     Status: Abnormal   Collection Time: 11/01/15  8:27 AM  Result Value Ref Range   WBC 8.2 4.0 - 10.5 K/uL   RBC 5.09 3.87 - 5.11 Mil/uL   Platelets 289.0 150.0 - 400.0 K/uL   Hemoglobin 15.1 (H) 12.0 - 15.0 g/dL   HCT 45.5 36.0 - 46.0 %   MCV 89.3 78.0 - 100.0 fl   MCHC 33.2 30.0 - 36.0 g/dL   RDW 13.3 11.5 - 15.5 %  Comp Met (CMET)     Status: Abnormal   Collection Time: 11/01/15  8:27 AM  Result Value Ref Range   Sodium 138 135 - 145 mEq/L   Potassium 4.5 3.5 - 5.1 mEq/L   Chloride 104 96 - 112 mEq/L   CO2 27 19 - 32 mEq/L   Glucose, Bld 124 (H) 70 - 99 mg/dL   BUN 22 6 - 23 mg/dL   Creatinine, Ser 1.06 0.40 - 1.20 mg/dL   Total Bilirubin 0.5 0.2 - 1.2 mg/dL   Alkaline Phosphatase 81 39 - 117 U/L   AST 15 0 - 37 U/L   ALT 10 0 - 35 U/L   Total Protein 7.4 6.0 - 8.3 g/dL   Albumin 4.1 3.5 - 5.2 g/dL   Calcium 9.5 8.4 - 10.5 mg/dL   GFR 53.43 (L) >60.00 mL/min  TSH     Status: None   Collection Time: 11/01/15  8:27 AM  Result Value Ref Range   TSH 2.28 0.35 - 4.50 uIU/mL  Hemoglobin A1c     Status: None   Collection Time: 11/01/15  8:27 AM  Result Value Ref Range   Hgb A1c MFr Bld 5.8 4.6 - 6.5 %    Comment: Glycemic Control Guidelines for People with Diabetes:Non Diabetic:  <6%Goal of Therapy: <7%Additional Action Suggested:  >8%   CULTURE, URINE  COMPREHENSIVE     Status: None   Collection Time: 11/01/15  8:27 AM  Result Value Ref Range   Colony Count NO GROWTH    Organism ID, Bacteria NO GROWTH   POCT Urinalysis Dipstick (Automated)     Status: Abnormal   Collection Time: 11/01/15  8:28 AM  Result Value Ref Range   Color, UA light-yellow    Clarity, UA clear    Glucose, UA neg    Bilirubin, UA neg    Ketones, UA neg    Spec Grav, UA 1.015    Blood, UA neg    pH, UA 6.5    Protein, UA neg    Urobilinogen, UA 0.2    Nitrite, UA neg    Leukocytes, UA large (3+) (A) Negative    Assessment/Plan: UTI symptoms Urine dip + large LE. Classic symptoms. Will send urine for culture. Will begin Keflex for UTI. Supportive measures reviewed.  Other fatigue Unclear etiology nut likely multifactorial. (1) Patient likely with mild UTI being treated. (2) Patient with history of hypothyroidism and DM needing lab assessment for control. (3) History of OSA not wearing CPAP as directed. Will check lab panel to include CBC, CMP, A1C, TSH and Vitamin D level. Continue medications as directed. UTI being treated. Patient encouraged to restart CPAP and follow-up with Pulmonary. Will alter regimen based on findings.

## 2015-11-01 NOTE — Telephone Encounter (Signed)
Please advise.//AB/CMA 

## 2015-11-01 NOTE — Telephone Encounter (Signed)
She was seen can discontinue that order

## 2015-11-01 NOTE — Patient Instructions (Signed)
Please go to the lab for blood work. Cutting back on your work hours should help some with fatigue. I do think this issue is multifactorial. We will rule out some common causes of fatigue with blood work today. The UTI may be contributing (see treatment below). Depressed mood also contributes to fatigue. We will decide on treating this based on other results. Lastly your OSA can contribute. I am setting you back up with Pulmonology. You will be contacted for appointment.  Your symptoms are consistent with a bladder infection, also called acute cystitis. Please take your antibiotic (Keflex) as directed until all pills are gone.  Stay very well hydrated.  Consider a daily probiotic (Align, Culturelle, or Activia) to help prevent stomach upset caused by the antibiotic.  Taking a probiotic daily may also help prevent recurrent UTIs.  Also consider taking AZO (Phenazopyridine) tablets to help decrease pain with urination.  I will call you with your urine testing results.  We will change antibiotics if indicated.  Call or return to clinic if symptoms are not resolved by completion of antibiotic.   Urinary Tract Infection A urinary tract infection (UTI) can occur any place along the urinary tract. The tract includes the kidneys, ureters, bladder, and urethra. A type of germ called bacteria often causes a UTI. UTIs are often helped with antibiotic medicine.  HOME CARE   If given, take antibiotics as told by your doctor. Finish them even if you start to feel better.  Drink enough fluids to keep your pee (urine) clear or pale yellow.  Avoid tea, drinks with caffeine, and bubbly (carbonated) drinks.  Pee often. Avoid holding your pee in for a long time.  Pee before and after having sex (intercourse).  Wipe from front to back after you poop (bowel movement) if you are a woman. Use each tissue only once. GET HELP RIGHT AWAY IF:   You have back pain.  You have lower belly (abdominal) pain.  You have  chills.  You feel sick to your stomach (nauseous).  You throw up (vomit).  Your burning or discomfort with peeing does not go away.  You have a fever.  Your symptoms are not better in 3 days. MAKE SURE YOU:   Understand these instructions.  Will watch your condition.  Will get help right away if you are not doing well or get worse. Document Released: 03/16/2008 Document Revised: 06/22/2012 Document Reviewed: 04/28/2012 [facility] Patient Information 2015 [place], [place]. This information is not intended to replace advice given to you by your health care provider. Make sure you discuss any questions you have with your health care provider.

## 2015-11-01 NOTE — Progress Notes (Signed)
Pre visit review using our clinic review tool, if applicable. No additional management support is needed unless otherwise documented below in the visit note. 

## 2015-11-02 LAB — CULTURE, URINE COMPREHENSIVE
Colony Count: NO GROWTH
Organism ID, Bacteria: NO GROWTH

## 2015-11-03 DIAGNOSIS — R5383 Other fatigue: Secondary | ICD-10-CM | POA: Insufficient documentation

## 2015-11-03 DIAGNOSIS — R399 Unspecified symptoms and signs involving the genitourinary system: Secondary | ICD-10-CM | POA: Insufficient documentation

## 2015-11-03 NOTE — Assessment & Plan Note (Signed)
Urine dip + large LE. Classic symptoms. Will send urine for culture. Will begin Keflex for UTI. Supportive measures reviewed.

## 2015-11-03 NOTE — Assessment & Plan Note (Signed)
Unclear etiology nut likely multifactorial. (1) Patient likely with mild UTI being treated. (2) Patient with history of hypothyroidism and DM needing lab assessment for control. (3) History of OSA not wearing CPAP as directed. Will check lab panel to include CBC, CMP, A1C, TSH and Vitamin D level. Continue medications as directed. UTI being treated. Patient encouraged to restart CPAP and follow-up with Pulmonary. Will alter regimen based on findings.

## 2015-11-04 ENCOUNTER — Ambulatory Visit: Payer: PPO | Admitting: Family Medicine

## 2015-11-04 LAB — VITAMIN D 1,25 DIHYDROXY
Vitamin D 1, 25 (OH)2 Total: 56 pg/mL (ref 18–72)
Vitamin D2 1, 25 (OH)2: 23 pg/mL
Vitamin D3 1, 25 (OH)2: 33 pg/mL

## 2016-01-02 ENCOUNTER — Other Ambulatory Visit: Payer: Self-pay | Admitting: Family Medicine

## 2016-01-02 MED ORDER — ALPRAZOLAM 0.5 MG PO TABS: 0.5000 mg | ORAL_TABLET | Freq: Two times a day (BID) | ORAL | Status: DC | PRN

## 2016-01-02 NOTE — Telephone Encounter (Signed)
i have refilled both with 1 rf but she has not seen me since September so would need an appt for further refils

## 2016-01-02 NOTE — Telephone Encounter (Signed)
Last refill 09/02/2015   #60 with 2 refills

## 2016-01-02 NOTE — Telephone Encounter (Signed)
Faxed hardcopy for Alprazolam to [facility] [address] [place]

## 2016-01-24 ENCOUNTER — Other Ambulatory Visit: Payer: Self-pay | Admitting: Family Medicine

## 2016-02-10 ENCOUNTER — Ambulatory Visit (INDEPENDENT_AMBULATORY_CARE_PROVIDER_SITE_OTHER)
Admission: RE | Admit: 2016-02-10 | Discharge: 2016-02-10 | Disposition: A | Discharge status: Home / Self Care | Payer: PPO | Source: Ambulatory Visit | Attending: Pulmonary Disease | Admitting: Pulmonary Disease

## 2016-02-10 ENCOUNTER — Ambulatory Visit (INDEPENDENT_AMBULATORY_CARE_PROVIDER_SITE_OTHER): Payer: PPO | Admitting: Pulmonary Disease

## 2016-02-10 ENCOUNTER — Encounter: Payer: Self-pay | Admitting: Pulmonary Disease

## 2016-02-10 VITALS — BP 138/96 | HR 73 | Temp 97.4°F | Ht 65.0 in | Wt 214.0 lb

## 2016-02-10 DIAGNOSIS — M545 Low back pain: Secondary | ICD-10-CM

## 2016-02-10 DIAGNOSIS — J449 Chronic obstructive pulmonary disease, unspecified: Secondary | ICD-10-CM | POA: Insufficient documentation

## 2016-02-10 DIAGNOSIS — I10 Essential (primary) hypertension: Secondary | ICD-10-CM | POA: Diagnosis not present

## 2016-02-10 DIAGNOSIS — G629 Polyneuropathy, unspecified: Secondary | ICD-10-CM

## 2016-02-10 DIAGNOSIS — G4733 Obstructive sleep apnea (adult) (pediatric): Secondary | ICD-10-CM | POA: Diagnosis not present

## 2016-02-10 DIAGNOSIS — I872 Venous insufficiency (chronic) (peripheral): Secondary | ICD-10-CM

## 2016-02-10 DIAGNOSIS — G8929 Other chronic pain: Secondary | ICD-10-CM

## 2016-02-10 MED ORDER — IPRATROPIUM-ALBUTEROL 0.5-2.5 (3) MG/3ML IN SOLN: 3.0000 mL | Freq: Three times a day (TID) | RESPIRATORY_TRACT | Status: DC

## 2016-02-10 MED ORDER — CLONAZEPAM 0.5 MG PO TABS: 0.2500 mg | ORAL_TABLET | Freq: Two times a day (BID) | ORAL | Status: DC | PRN

## 2016-02-10 NOTE — Patient Instructions (Signed)
[place]-- it was great seeing you again...  Today we checked a follow up CXR, breathing test, and ambulatory oximetry test...  We decided to adjust your meds as follows:    Continue the SYMBICORT160- 2sprays twice daily & the SINGULAIR 10mg  once daily...    We will write for medication to put in your NEBULIZER- DUONEB use in neb machine three times daily as needed...    Finally we are adding KLONOPIN 0.5mg  take 1/2 to 1 tab twice daily to relax the chest wall muscles & allow a deeper breath       (it will also help w/ the anxiety that you are under)...  You also need a NEW CPAP MACHINE & we will arrange this thru [facility]...    Use it nightly & we will recheck you in 6-8 weeks w/ a CPAP sownload...  Call for any questions...  Let's plan a follow up visit in 6 weeks, sooner if needed for problems.Marland KitchenMarland Kitchen

## 2016-02-11 ENCOUNTER — Encounter: Payer: Self-pay | Admitting: Pulmonary Disease
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
## 2016-02-12 ENCOUNTER — Telehealth: Payer: Self-pay | Admitting: Pulmonary Disease

## 2016-02-12 ENCOUNTER — Telehealth: Payer: Self-pay | Admitting: Family Medicine

## 2016-02-12 NOTE — Progress Notes (Signed)
Quick Note:  lmtcb for pt. ______ 

## 2016-02-12 NOTE — Telephone Encounter (Signed)
I do not see that Dr Charlett Blake has prescribed this for pt before.  Please advise request?

## 2016-02-12 NOTE — Telephone Encounter (Signed)
I am willing to write the Singulair 10 mg po daily prn, disp #30 with 2 rfr

## 2016-02-12 NOTE — Telephone Encounter (Signed)
Spoke with pt and advised of cxr results per Dr Lenna Gilford.

## 2016-02-12 NOTE — Telephone Encounter (Signed)
Caller name: Self  Can be reached:[phone]  Pharmacy:  [id number] - [place], [place] - [address] [phone] (Phone) [phone] (Fax)         Reason for call: Refill montelukast (SINGULAIR) 10 MG tablet [id number]

## 2016-02-13 MED ORDER — MONTELUKAST SODIUM 10 MG PO TABS: 10.0000 mg | ORAL_TABLET | Freq: Every day | ORAL | Status: DC

## 2016-02-13 NOTE — Telephone Encounter (Signed)
singulair sent in 

## 2016-02-18 ENCOUNTER — Telehealth: Payer: Self-pay | Admitting: *Deleted

## 2016-02-18 NOTE — Telephone Encounter (Signed)
Initiated PA for Xopenex thru [place]. Key: [id number]  Sent for review.   [place] (p) [phone]  (f) [phone]

## 2016-02-19 NOTE — Telephone Encounter (Signed)
CMM shows PA is still pending.

## 2016-02-20 ENCOUNTER — Telehealth: Payer: Self-pay | Admitting: Pulmonary Disease

## 2016-02-20 NOTE — Telephone Encounter (Signed)
Per Sherry/PCC - pt has to go through [facility], pay out of pocket $100 before process began and forms have to be filled out by both and patient and physician. [facility] located in [place] is the location that does the Assistance Program   LM for patient to make aware.

## 2016-02-20 NOTE — Telephone Encounter (Signed)
Patient states that she received a new CPAP machine.  The new machine is going to cost her $300.  Patient wants to know if she really needs a new CPAP, or can she use her old machine.  She wants to know if there is a reason why Dr. Lenna Gilford wants her to use the new machine?  She said if there is not a big need for the machine, she doesn't want to pay the $300 for it.  Dr. Lenna Gilford, please advise.

## 2016-02-20 NOTE — Telephone Encounter (Signed)
Per SN: she definitely needs new machine.  Is there an option for copay assistance or similar help?  Would ask PCCs for help with this.  Thank you.

## 2016-02-21 NOTE — Telephone Encounter (Signed)
Pt returning call.Jenny [name] ° °

## 2016-02-21 NOTE — Telephone Encounter (Signed)
Spoke with pt. States that she is already set up with [facility] to do this. She just wanted to make sure that she really needed to update her machine. Advised her since her current machine is so old that she should probably go ahead an update her machine. States that she has an appointment with [facility] on Tuesday to update her machine. Nothing further was needed.

## 2016-02-21 NOTE — Telephone Encounter (Signed)
CMM shows still pending.

## 2016-02-28 ENCOUNTER — Other Ambulatory Visit: Payer: Self-pay | Admitting: Family Medicine

## 2016-02-28 MED ORDER — ALPRAZOLAM 0.5 MG PO TABS: 0.5000 mg | ORAL_TABLET | Freq: Two times a day (BID) | ORAL | Status: DC | PRN

## 2016-02-28 NOTE — Telephone Encounter (Signed)
Faxed hardcopy for alprazolam to [facility] in [place].

## 2016-02-28 NOTE — Telephone Encounter (Signed)
Requesting: Alprazolam Contract  11/12/2014 UDS   Last OV 06/25/2015 Last Refill   #60 with 1 refill 01/02/2016  Please Advise

## 2016-02-28 NOTE — Telephone Encounter (Signed)
Called pt's insurance she states med was ran under Part B and approved. Called pharmacy and they state pt has picked up med.

## 2016-02-28 NOTE — Telephone Encounter (Signed)
She can have a 30 day rx for alprazolam but she needs an appt before she can have more

## 2016-03-31 ENCOUNTER — Other Ambulatory Visit: Payer: Self-pay | Admitting: Family Medicine

## 2016-03-31 MED ORDER — GABAPENTIN 300 MG PO CAPS: 300.0000 mg | ORAL_CAPSULE | Freq: Three times a day (TID) | ORAL | Status: DC

## 2016-04-01 ENCOUNTER — Other Ambulatory Visit: Payer: Self-pay | Admitting: Family Medicine

## 2016-04-01 NOTE — Telephone Encounter (Signed)
Spoke with pt and she states that she does not need a refill on the gabapentin at this time. I advised the pt that I would cancel the order.

## 2016-04-06 ENCOUNTER — Other Ambulatory Visit: Payer: Self-pay | Admitting: Family Medicine

## 2016-04-06 ENCOUNTER — Encounter: Payer: Self-pay | Admitting: Pulmonary Disease

## 2016-04-06 MED ORDER — ALPRAZOLAM 0.5 MG PO TABS: 0.5000 mg | ORAL_TABLET | Freq: Two times a day (BID) | ORAL | Status: DC | PRN

## 2016-04-06 NOTE — Telephone Encounter (Signed)
Requesting: Alprazolam Contract  11/12/2014 UDS  None Last OV   06/25/2015 Last Refill   02/28/16   #60 with 0 refills.  Please Advise

## 2016-04-06 NOTE — Telephone Encounter (Signed)
Faxed hardcopy for alprazolam to [facility]

## 2016-04-07 ENCOUNTER — Encounter: Payer: Self-pay | Admitting: Pulmonary Disease

## 2016-04-07 ENCOUNTER — Ambulatory Visit (INDEPENDENT_AMBULATORY_CARE_PROVIDER_SITE_OTHER): Payer: PPO | Admitting: Pulmonary Disease

## 2016-04-07 VITALS — BP 132/70 | HR 74 | Temp 97.4°F | Ht 65.0 in | Wt 220.2 lb

## 2016-04-07 DIAGNOSIS — J4520 Mild intermittent asthma, uncomplicated: Secondary | ICD-10-CM | POA: Diagnosis not present

## 2016-04-07 DIAGNOSIS — M545 Low back pain, unspecified: Secondary | ICD-10-CM

## 2016-04-07 DIAGNOSIS — E669 Obesity, unspecified: Secondary | ICD-10-CM | POA: Diagnosis not present

## 2016-04-07 DIAGNOSIS — G4733 Obstructive sleep apnea (adult) (pediatric): Secondary | ICD-10-CM | POA: Diagnosis not present

## 2016-04-07 NOTE — Patient Instructions (Signed)
Today we updated your med list in our EPIC system...    Continue your current medications the same...  Continue using your CPAP nightly...  Let's get on track w/ our DIET & EXERCISE...    The goal is to get the weight down!!!  Call for any questions...  Let's plan a follow up visit in 27mo, sooner if needed for problems.Marland KitchenMarland Kitchen

## 2016-04-27 ENCOUNTER — Other Ambulatory Visit: Payer: Self-pay | Admitting: Family Medicine

## 2016-04-30 ENCOUNTER — Other Ambulatory Visit: Payer: Self-pay | Admitting: Pulmonary Disease

## 2016-05-07 ENCOUNTER — Other Ambulatory Visit: Payer: Self-pay | Admitting: Family Medicine

## 2016-05-07 ENCOUNTER — Telehealth: Payer: Self-pay | Admitting: Medical

## 2016-05-07 MED ORDER — ALPRAZOLAM 0.5 MG PO TABS: 0.5000 mg | ORAL_TABLET | Freq: Two times a day (BID) | ORAL | 0 refills | Status: DC | PRN

## 2016-05-07 NOTE — Telephone Encounter (Signed)
I refilled her xanax. She can pick up or you can fax.

## 2016-05-07 NOTE — Telephone Encounter (Signed)
Requesting:  Alprazolam Contract  Signed on 11/12/2014 UDS  None Last OV  06/25/2015 Last Refill   #60 with 0 refills on 04/06/2016  Please Advise

## 2016-05-08 NOTE — Telephone Encounter (Signed)
Faxed hardcopy to [facility] 

## 2016-05-14 ENCOUNTER — Ambulatory Visit: Payer: PPO | Admitting: Family Medicine

## 2016-05-27 ENCOUNTER — Other Ambulatory Visit: Payer: Self-pay | Admitting: Family Medicine

## 2016-05-27 NOTE — Telephone Encounter (Signed)
Rx request to pharmacy/SLS  

## 2016-06-02 ENCOUNTER — Encounter: Payer: Self-pay | Admitting: Family Medicine

## 2016-06-02 ENCOUNTER — Ambulatory Visit (INDEPENDENT_AMBULATORY_CARE_PROVIDER_SITE_OTHER): Payer: PPO | Admitting: Family Medicine

## 2016-06-02 VITALS — BP 122/78 | HR 76 | Temp 98.0°F | Ht 65.0 in | Wt 226.0 lb

## 2016-06-02 DIAGNOSIS — Z1211 Encounter for screening for malignant neoplasm of colon: Secondary | ICD-10-CM | POA: Diagnosis not present

## 2016-06-02 DIAGNOSIS — E782 Mixed hyperlipidemia: Secondary | ICD-10-CM

## 2016-06-02 DIAGNOSIS — I10 Essential (primary) hypertension: Secondary | ICD-10-CM

## 2016-06-02 DIAGNOSIS — K219 Gastro-esophageal reflux disease without esophagitis: Secondary | ICD-10-CM | POA: Diagnosis not present

## 2016-06-02 DIAGNOSIS — E119 Type 2 diabetes mellitus without complications: Secondary | ICD-10-CM

## 2016-06-02 DIAGNOSIS — E669 Obesity, unspecified: Secondary | ICD-10-CM

## 2016-06-02 DIAGNOSIS — R739 Hyperglycemia, unspecified: Secondary | ICD-10-CM | POA: Diagnosis not present

## 2016-06-02 DIAGNOSIS — E039 Hypothyroidism, unspecified: Secondary | ICD-10-CM

## 2016-06-02 DIAGNOSIS — E1169 Type 2 diabetes mellitus with other specified complication: Secondary | ICD-10-CM

## 2016-06-02 MED ORDER — TOLNAFTATE 1 % EX POWD: 1.0000 "application " | Freq: Two times a day (BID) | CUTANEOUS | 1 refills | Status: DC | PRN

## 2016-06-02 MED ORDER — ALPRAZOLAM 0.5 MG PO TABS: 0.5000 mg | ORAL_TABLET | Freq: Two times a day (BID) | ORAL | 2 refills | Status: DC | PRN

## 2016-06-02 MED ORDER — FLUCONAZOLE 150 MG PO TABS: 150.0000 mg | ORAL_TABLET | ORAL | 0 refills | Status: DC

## 2016-06-02 NOTE — Patient Instructions (Signed)
Witch Astringent  Body Ringworm Ringworm (tinea corporis) is a fungal infection of the skin on the body. This infection is not caused by worms, but is actually caused by a fungus. Fungus normally lives on the top of your skin and can be useful. However, in the case of ringworms, the fungus grows out of control and causes a skin infection. It can involve any area of skin on the body and can spread easily from one person to another (contagious). Ringworm is a common problem for children, but it can affect adults as well. Ringworm is also often found in athletes, especially wrestlers who share equipment and mats.  CAUSES  Ringworm of the body is caused by a fungus called dermatophyte. It can spread by:  Touchingother people who are infected.  Touchinginfected pets.  Touching or sharingobjects that have been in contact with the infected person or pet (hats, combs, towels, clothing, sports equipment). SYMPTOMS   Itchy, raised red spots and bumps on the skin.  Ring-shaped rash.  Redness near the border of the rash with a clear center.  Dry and scaly skin on or around the rash. Not every person develops a ring-shaped rash. Some develop only the red, scaly patches. DIAGNOSIS  Most often, ringworm can be diagnosed by performing a skin exam. Your caregiver may choose to take a skin scraping from the affected area. The sample will be examined under the microscope to see if the fungus is present.  TREATMENT  Body ringworm may be treated with a topical antifungal cream or ointment. Sometimes, an antifungal shampoo that can be used on your body is prescribed. You may be prescribed antifungal medicines to take by mouth if your ringworm is severe, keeps coming back, or lasts a long time.  HOME CARE INSTRUCTIONS   Only take over-the-counter or prescription medicines as directed by your caregiver.  Wash the infected area and dry it completely before applying yourcream or ointment.  When using  antifungal shampoo to treat the ringworm, leave the shampoo on the body for 3-5 minutes before rinsing.   Wear loose clothing to stop clothes from rubbing and irritating the rash.  Wash or change your bed sheets every night while you have the rash.  Have your pet treated by your veterinarian if it has the same infection. To prevent ringworm:   Practice good hygiene.  Wear sandals or shoes in public places and showers.  Do not share personal items with others.  Avoid touching red patches of skin on other people.  Avoid touching pets that have bald spots or wash your hands after doing so. SEEK MEDICAL CARE IF:   Your rash continues to spread after 7 days of treatment.  Your rash is not gone in 4 weeks.  The area around your rash becomes red, warm, tender, and swollen.   This information is not intended to replace advice given to you by your health care provider. Make sure you discuss any questions you have with your health care provider.   Document Released: 09/25/2000 Document Revised: 06/22/2012 Document Reviewed: 04/11/2012 Elsevier Interactive Patient Education [employer].

## 2016-06-02 NOTE — Progress Notes (Signed)
Pre visit review using our clinic review tool, if applicable. No additional management support is needed unless otherwise documented below in the visit note. 

## 2016-06-03 ENCOUNTER — Encounter: Payer: Self-pay | Admitting: Gastroenterology

## 2016-06-15 DIAGNOSIS — Z1211 Encounter for screening for malignant neoplasm of colon: Secondary | ICD-10-CM | POA: Insufficient documentation

## 2016-06-15 NOTE — Assessment & Plan Note (Signed)
Well controlled, no changes to meds. Encouraged heart healthy diet such as the DASH diet and exercise as tolerated.  °

## 2016-06-15 NOTE — Progress Notes (Signed)
Patient ID: KOY SZOPINSKI, female   DOB: 1941-08-26, 75 y.o.   MRN: [id number]   Subjective:    Patient ID: Jenny [name], female    DOB: [birth year], 75 y.o.   MRN: [id number]  Chief Complaint  Patient presents with  . Referral    colonoscopy    HPI Patient is in today for follow up. She feels well today. Continues to struggle with low back pain. Has been seen by Dr Joya Salm. Has struggled with constipation at times. No bloody or tarry stool. Denies CP/palp/SOB/HA/congestion/fevers/GI or GU c/o. Taking meds as prescribed  Past Medical History:  Diagnosis Date  . ALLERGIC RHINITIS 08/23/2007  . Allergy    rhinitis  . 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 Anemia 09/21/2011  . Annual physical exam 08/06/2013  . Asthma    mild, intermittent  . Asthma 01/14/2011  . Atrophic vaginitis 06/17/2012  . BACK PAIN, LUMBAR 08/23/2007  . Bronchitis   . BRONCHITIS, RECURRENT 02/22/2008  . Cough    usually not productive  . Degenerative joint disease   . DEGENERATIVE JOINT DISEASE 02/22/2008  . Depression with anxiety 03/06/2008   Qualifier: Diagnosis of  By: Lenna Gilford MD, [place], BORDERLINE 08/21/2008  . Diverticulosis of colon   . DIVERTICULOSIS OF COLON 02/22/2008  . Dysthymia   . 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  . GERD 08/23/2007  . GERD (gastroesophageal reflux disease)   . HAND PAIN 04/09/2010  . History of chicken pox 01/14/2011  . History of measles 01/14/2011  . Hyperglycemia 07/07/2015  . Hypertension   . HYPERTENSION 02/22/2008  . Hypothyroidism   . HYPOTHYROIDISM 08/23/2007  . Incontinence of urine   . Lumbar back pain   . Obesity   . OBESITY 08/23/2007  . Obstructive sleep apnea    does not use CPAP, lost weight  . Other urinary incontinence 03/12/2010  . Peripheral neuropathy ([place]) 02/12/2011   possibly related to spondylolisthesis per pt  . PONV (postoperative nausea and vomiting)   . SLEEP APNEA, OBSTRUCTIVE 08/23/2007  . Transient memory loss March 2006   after a  stressful event she had no memory the rest of that day  . Urinary frequency   . Urinary incontinence   . UTI (lower urinary tract infection) 03/23/2012  . Venous insufficiency   . VENOUS INSUFFICIENCY 08/23/2007  . Vitamin D deficiency   . VITAMIN D DEFICIENCY 08/21/2008    Past Surgical History:  Procedure Laterality Date  . ABDOMINAL HYSTERECTOMY     took one ovary  . AP repair and sling for cystocele  8-04    Dr Gaynelle Arabian  . APPENDECTOMY    . carpal tunel  2008  . CHOLECYSTECTOMY    . ctr     bilateral  . left hand  2010   left thumb, joint removal, [facility]  . NECK SURGERY     x 2 - discectomy, fusion  . SHOULDER ARTHROSCOPY DISTAL CLAVICLE EXCISION AND OPEN ROTATOR CUFF REPAIR     right  . TONSILLECTOMY AND ADENOIDECTOMY      Family History  Problem Relation Age of Onset  . COPD Mother   . Pneumonia Mother   . Other Mother     CHF  . Hypertension Mother   . Arthritis Mother     septic knee after replacement  . Heart disease Mother     chf  . Dementia Father   . Arthritis Brother   . Asthma Daughter   . Cancer Maternal  Aunt     breast  . Stroke Maternal Grandmother   . Heart disease Maternal Grandmother   . Other Maternal Grandfather     CHF  . Heart disease Maternal Grandfather     chf  . Cancer Paternal Grandmother     stomach?  . Other Paternal Grandfather     problems with kidneys  . Kidney disease Paternal Grandfather   . Anxiety disorder Daughter   . Cancer Maternal Aunt     liver    Social History   Social History  . Marital status: Widowed    Spouse name: N/A  . Number of children: N/A  . Years of education: N/A   Occupational History  . Not on file.   Social History Main Topics  . Smoking status: Former Smoker    Packs/day: 1.50    Years: 30.00    Types: Cigarettes    Quit date: 10/13/1991  . Smokeless tobacco: Never Used  . Alcohol use No  . Drug use: No  . Sexual activity: No   Other Topics Concern  . Not on file   Social  History Narrative  . No narrative on file    Outpatient Medications Prior to Visit  Medication Sig Dispense Refill  . albuterol (PROVENTIL HFA;VENTOLIN HFA) 108 (90 BASE) MCG/ACT inhaler Inhale 2 puffs into the lungs every 6 (six) hours as needed. For wheezing 1 Inhaler 6  . aspirin EC 81 MG tablet Take 81 mg by mouth daily.      . budesonide-formoterol (SYMBICORT) 160-4.5 MCG/ACT inhaler Inhale 2 puffs into the lungs 2 (two) times daily. (Patient taking differently: Inhale 2 puffs into the lungs daily. ) 1 Inhaler 3  . clonazePAM (KLONOPIN) 0.5 MG tablet TAKE 1/2 TO 1 TABLET BY MOUTH TWICE DAILY AS NEEDED FOR ANXIETY 60 tablet 0  . gabapentin (NEURONTIN) 300 MG capsule Take 1 capsule (300 mg total) by mouth 3 (three) times daily. 270 capsule 0  . ibuprofen (ADVIL,MOTRIN) 200 MG tablet Take 400 mg by mouth every 6 (six) hours as needed for moderate pain.    Marland Kitchen ipratropium-albuterol (DUONEB) 0.5-2.5 (3) MG/3ML SOLN Take 3 mLs by nebulization 3 (three) times daily. 360 mL 5  . levothyroxine (SYNTHROID, LEVOTHROID) 100 MCG tablet TAKE 1 TABLET BY MOUTH EVERY DAY BEFORE BREAKFAST 90 tablet 0  . lisinopril (PRINIVIL,ZESTRIL) 5 MG tablet TAKE 1 TABLET BY MOUTH DAILY 90 tablet 0  . montelukast (SINGULAIR) 10 MG tablet Take 1 tablet (10 mg total) by mouth at bedtime. 30 tablet 2  . ALPRAZolam (XANAX) 0.5 MG tablet Take 1 tablet (0.5 mg total) by mouth 2 (two) times daily as needed for anxiety or sleep. TAKE 1 TABLET BY MOUTH DAILY AS NEEDED FOR SLEEP/ANXIETY 60 tablet 0   No facility-administered medications prior to visit.     Allergies  Allergen Reactions  . Prednisone Other (See Comments)    Confusion, dizziness Medrol is ok  . Codeine Nausea Only         Review of Systems  Constitutional: Negative for fever and malaise/fatigue.  HENT: Negative for congestion.   Eyes: Negative for blurred vision.  Respiratory: Negative for shortness of breath.   Cardiovascular: Negative for chest pain,  palpitations and leg swelling.  Gastrointestinal: Negative for abdominal pain, blood in stool and nausea.  Genitourinary: Negative for dysuria and frequency.  Musculoskeletal: Negative for falls.  Skin: Negative for rash.  Neurological: Negative for dizziness, loss of consciousness and headaches.  Endo/Heme/Allergies: Negative for environmental  allergies.  Psychiatric/Behavioral: Negative for depression. The patient is not nervous/anxious.        Objective:    Physical Exam  Constitutional: She is oriented to person, place, and time. She appears well-developed and well-nourished. No distress.  HENT:  Head: Normocephalic and atraumatic.  Eyes: Conjunctivae are normal.  Neck: Neck supple. No thyromegaly present.  Cardiovascular: Normal rate, regular rhythm and normal heart sounds.   No murmur heard. Pulmonary/Chest: Effort normal and breath sounds normal. No respiratory distress.  Abdominal: Soft. Bowel sounds are normal. She exhibits no distension and no mass. There is no tenderness.  Musculoskeletal: She exhibits no edema.  Lymphadenopathy:    She has no cervical adenopathy.  Neurological: She is alert and oriented to person, place, and time.  Skin: Skin is warm and dry.  Psychiatric: She has a normal mood and affect. Her behavior is normal.    BP 122/78 (BP Location: Left Arm, Patient Position: Sitting, Cuff Size: Large)   Pulse 76   Temp 98 F (36.7 C) (Oral)   Ht 5\' 5"  (1.651 m)   Wt 226 lb (102.5 kg)   SpO2 96%   BMI 37.61 kg/m  Wt Readings from Last 3 Encounters:  06/02/16 226 lb (102.5 kg)  04/07/16 220 lb 3.2 oz (99.9 kg)  02/10/16 214 lb (97.1 kg)     Lab Results  Component Value Date   WBC 8.2 11/01/2015   HGB 15.1 (H) 11/01/2015   HCT 45.5 11/01/2015   PLT 289.0 11/01/2015   GLUCOSE 124 (H) 11/01/2015   CHOL 125 02/15/2015   TRIG 180 (H) 02/15/2015   HDL 35 (L) 02/15/2015   LDLCALC 54 02/15/2015   ALT 10 11/01/2015   AST 15 11/01/2015   NA 138  11/01/2015   K 4.5 11/01/2015   CL 104 11/01/2015   CREATININE 1.06 11/01/2015   BUN 22 11/01/2015   CO2 27 11/01/2015   TSH 2.28 11/01/2015   HGBA1C 5.8 11/01/2015   MICROALBUR 1.3 01/14/2011    Lab Results  Component Value Date   TSH 2.28 11/01/2015   Lab Results  Component Value Date   WBC 8.2 11/01/2015   HGB 15.1 (H) 11/01/2015   HCT 45.5 11/01/2015   MCV 89.3 11/01/2015   PLT 289.0 11/01/2015   Lab Results  Component Value Date   NA 138 11/01/2015   K 4.5 11/01/2015   CO2 27 11/01/2015   GLUCOSE 124 (H) 11/01/2015   BUN 22 11/01/2015   CREATININE 1.06 11/01/2015   BILITOT 0.5 11/01/2015   ALKPHOS 81 11/01/2015   AST 15 11/01/2015   ALT 10 11/01/2015   PROT 7.4 11/01/2015   ALBUMIN 4.1 11/01/2015   CALCIUM 9.5 11/01/2015   ANIONGAP 11 08/27/2014   GFR 53.43 (L) 11/01/2015   Lab Results  Component Value Date   CHOL 125 02/15/2015   Lab Results  Component Value Date   HDL 35 (L) 02/15/2015   Lab Results  Component Value Date   LDLCALC 54 02/15/2015   Lab Results  Component Value Date   TRIG 180 (H) 02/15/2015   Lab Results  Component Value Date   CHOLHDL 3.6 02/15/2015   Lab Results  Component Value Date   HGBA1C 5.8 11/01/2015       Assessment & Plan:   Problem List Items Addressed This Visit    Hypothyroidism    On Levothyroxine, continue to monitor      Essential hypertension    Well controlled, no changes to  meds. Encouraged heart healthy diet such as the DASH diet and exercise as tolerated.       GERD    Avoid offending foods, start probiotics. Do not eat large meals in late evening and consider raising head of bed.       Diabetes mellitus type 2 in obese (HCC)    hgba1c acceptable, minimize simple carbs. Increase exercise as tolerated. Continue current meds      Hyperlipidemia, mixed    Encouraged heart healthy diet, increase exercise, avoid trans fats, consider a krill oil cap daily      Hyperglycemia    hgba1c  acceptable, minimize simple carbs. Increase exercise as tolerated.       Colon cancer screening - Primary    Referred for colonoscopy.      Relevant Orders   Ambulatory referral to Gastroenterology    Other Visit Diagnoses   None.     I am having Ms. Busker start on tolnaftate and fluconazole. I am also having her maintain her aspirin EC, budesonide-formoterol, ibuprofen, albuterol, ipratropium-albuterol, montelukast, gabapentin, levothyroxine, clonazePAM, lisinopril, and ALPRAZolam.  Meds ordered this encounter  Medications  . ALPRAZolam (XANAX) 0.5 MG tablet    Sig: Take 1 tablet (0.5 mg total) by mouth 2 (two) times daily as needed for anxiety or sleep. TAKE 1 TABLET BY MOUTH DAILY AS NEEDED FOR SLEEP/ANXIETY    Dispense:  60 tablet    Refill:  2    NEEDS APPOINTMENT FOR FURTHER REFILLS.  Marland Kitchen tolnaftate (TINACTIN) 1 % powder    Sig: Apply 1 application topically 2 (two) times daily as needed for itching.    Dispense:  45 g    Refill:  1  . fluconazole (DIFLUCAN) 150 MG tablet    Sig: Take 1 tablet (150 mg total) by mouth once a week.    Dispense:  2 tablet    Refill:  0     Penni Homans, MD

## 2016-06-15 NOTE — Assessment & Plan Note (Signed)
Avoid offending foods, start probiotics. Do not eat large meals in late evening and consider raising head of bed.  

## 2016-06-15 NOTE — Assessment & Plan Note (Signed)
hgba1c acceptable, minimize simple carbs. Increase exercise as tolerated.  

## 2016-06-15 NOTE — Assessment & Plan Note (Signed)
Encouraged heart healthy diet, increase exercise, avoid trans fats, consider a krill oil cap daily 

## 2016-06-15 NOTE — Assessment & Plan Note (Signed)
On Levothyroxine, continue to monitor 

## 2016-06-15 NOTE — Assessment & Plan Note (Signed)
Referred for colonoscopy

## 2016-06-15 NOTE — Assessment & Plan Note (Signed)
hgba1c acceptable, minimize simple carbs. Increase exercise as tolerated. Continue current meds 

## 2016-06-23 ENCOUNTER — Ambulatory Visit (INDEPENDENT_AMBULATORY_CARE_PROVIDER_SITE_OTHER): Payer: PPO | Admitting: Pulmonary Disease

## 2016-06-23 ENCOUNTER — Encounter: Payer: Self-pay | Admitting: Pulmonary Disease

## 2016-06-23 ENCOUNTER — Telehealth: Payer: Self-pay | Admitting: Family Medicine

## 2016-06-23 VITALS — BP 128/80 | HR 77 | Temp 97.3°F | Ht 65.0 in | Wt 219.4 lb

## 2016-06-23 DIAGNOSIS — J410 Simple chronic bronchitis: Secondary | ICD-10-CM | POA: Diagnosis not present

## 2016-06-23 DIAGNOSIS — G4733 Obstructive sleep apnea (adult) (pediatric): Secondary | ICD-10-CM | POA: Diagnosis not present

## 2016-06-23 DIAGNOSIS — M545 Low back pain: Secondary | ICD-10-CM | POA: Diagnosis not present

## 2016-06-23 DIAGNOSIS — G8929 Other chronic pain: Secondary | ICD-10-CM

## 2016-06-23 DIAGNOSIS — J4520 Mild intermittent asthma, uncomplicated: Secondary | ICD-10-CM | POA: Diagnosis not present

## 2016-06-23 DIAGNOSIS — E6609 Other obesity due to excess calories: Secondary | ICD-10-CM

## 2016-06-23 MED ORDER — PNEUMOCOCCAL VAC POLYVALENT 25 MCG/0.5ML IJ INJ
0.5000 mL | INJECTION | Freq: Once | INTRAMUSCULAR | Status: AC
  Administered 2016-06-23: 0.5 mL via INTRAMUSCULAR

## 2016-06-23 MED ORDER — AZITHROMYCIN 250 MG PO TABS: ORAL_TABLET | ORAL | 2 refills | Status: DC

## 2016-06-23 NOTE — Patient Instructions (Signed)
Today we updated your med list in our EPIC system...    Continue your current medications the same...  We gave you the PNEUMOVAX-23 today...    This will be the last pneumonia vaccine that you will require...    For your cough>>    Continue the DUONEB twice daily (more if needed)...    Followed by the [facility] 2 sprays twice daily...    Use the OTC MUCINEX 600mg  - 2 tabs twice daily w/ lots of fluids...    Add-in the OTC DELSYM -- 2 tsp twice daily as well  You may also benefit from Chlorseptic throat spray, cough drops or throat lozenges...  We wrote for a ZPak to keep on hand for upper resp infections this winter...  Call for any questions...  Let's plan a follow up visit in 57mo, sooner if needed for problems.Marland KitchenMarland Kitchen
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
## 2016-06-23 NOTE — Telephone Encounter (Signed)
LM to schedule AWV with RN ° °

## 2016-06-25 ENCOUNTER — Telehealth: Payer: Self-pay | Admitting: Family Medicine

## 2016-06-25 NOTE — Telephone Encounter (Signed)
OK with me.

## 2016-06-25 NOTE — Telephone Encounter (Signed)
Pt would like to trans to Dr. [name], please advise ok to schedule.

## 2016-06-29 NOTE — Telephone Encounter (Signed)
Ok to transfer.  I know my new pt appts are out quite a bit so please ask pt if she is agreeable to seeing the new provider when they start (this may be faster for her!)

## 2016-06-30 NOTE — Telephone Encounter (Signed)
Pt has been scheduled.  °

## 2016-07-06 ENCOUNTER — Other Ambulatory Visit: Payer: Self-pay | Admitting: Pulmonary Disease

## 2016-07-06 NOTE — Progress Notes (Signed)
This encounter was created in error - please disregard.

## 2016-07-31 ENCOUNTER — Other Ambulatory Visit: Payer: Self-pay | Admitting: Family Medicine

## 2016-08-10 ENCOUNTER — Ambulatory Visit (AMBULATORY_SURGERY_CENTER): Payer: Self-pay | Admitting: *Deleted

## 2016-08-10 VITALS — Ht 66.0 in | Wt 219.0 lb

## 2016-08-10 DIAGNOSIS — Z1211 Encounter for screening for malignant neoplasm of colon: Secondary | ICD-10-CM

## 2016-08-10 MED ORDER — NA SULFATE-K SULFATE-MG SULF 17.5-3.13-1.6 GM/177ML PO SOLN: ORAL | 0 refills | Status: DC

## 2016-08-10 NOTE — Progress Notes (Signed)
Patient denies any allergies to eggs or soy. Patient denies any problems with anesthesia/sedation. Patient denies any oxygen use at home and does not take any diet/weight loss medications.  

## 2016-08-11 ENCOUNTER — Encounter: Payer: Self-pay | Admitting: Gastroenterology

## 2016-08-12 ENCOUNTER — Encounter (INDEPENDENT_AMBULATORY_CARE_PROVIDER_SITE_OTHER): Payer: Self-pay | Admitting: Orthopedic Surgery

## 2016-08-12 ENCOUNTER — Ambulatory Visit (INDEPENDENT_AMBULATORY_CARE_PROVIDER_SITE_OTHER): Payer: PPO | Admitting: Orthopedic Surgery

## 2016-08-12 DIAGNOSIS — G8929 Other chronic pain: Secondary | ICD-10-CM

## 2016-08-12 DIAGNOSIS — M542 Cervicalgia: Secondary | ICD-10-CM | POA: Diagnosis not present

## 2016-08-12 DIAGNOSIS — M25512 Pain in left shoulder: Secondary | ICD-10-CM | POA: Diagnosis not present

## 2016-08-12 NOTE — Progress Notes (Signed)
Office Visit Note   Patient: Jenny [name]           Date of Birth: 09-08-1941           MRN: [id number] Visit Date: 08/12/2016 Requested by: Mosie Lukes, MD [address] [place], [place] [address] PCP: Penni Homans, MD  Subjective: Chief Complaint  Patient presents with  . Left Shoulder - Pain  . Neck - Pain  Jenny [name] is a 75 year old patient with left shoulder pain.  She's had back surgery by Dr. Joya Salm since of last seen her.  She has colonoscopy pending for the [facility].  She did have an injection in April which helped her for a while.  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  She has some mechanical symptoms.  Patient comes in today with left shoulder and neck pain.  We last saw her April 2017, and injected the left shoulder.  This helped her some, and now has had pain 1 month ago.  Now the left shoulder is popping and cracking posteriorly at joint. She feels pain in the left shoulder posteriorly, and it radiates up into neck. Left shoulder ROM varies, has mostly FROM, but hurts.  Neck ROM is decreased, cannot turn well.  Taking ibuprofen for discomfort.                 Review of Systems all systems reviewed negative as they relate to the chief complaint.  No fevers or chills   Assessment & Plan: Visit Diagnoses:  1. Chronic left shoulder pain   2. Cervicalgia     Plan: Impression is left shoulder pain.  Could be rotator cuff pathology or bursitis.  She's had one injection.  Second subacromial injection performed today per patient request.  Next step will be MRI scan.  She'll call me get that set up if this doesn't give her sustained relief.  Follow-Up Instructions: Return if symptoms worsen or fail to improve.   Orders:  Orders Placed This Encounter  Procedures  . Large Joint Injection/Arthrocentesis   No orders of the defined types were placed in this encounter.     Procedures: Large Joint Inj Date/Time: 08/12/2016 1:51  PM Performed by: Brand Males Authorized by: Meredith Pel   Consent Given by:  Patient Site marked: the procedure site was marked   Timeout: prior to procedure the correct patient, procedure, and site was verified   Indications:  Pain and diagnostic evaluation Location:  Shoulder Site:  L glenohumeral Prep: patient was prepped and draped in usual sterile fashion   Needle Size:  18 G Needle Length:  1.5 inches Approach:  Posterior Ultrasound Guidance: No   Fluoroscopic Guidance: No   Arthrogram: No Medications:  9 mL bupivacaine 0.5 %; 5 mL lidocaine 1 %; 40 mg methylPREDNISolone acetate 40 MG/ML Aspiration Attempted: No   Patient tolerance:  Patient tolerated the procedure well with no immediate complications      Clinical Data: No additional findings.  Objective: Vital Signs: There were no vitals taken for this visit.  Physical Exam  Constitutional: She appears well-developed.  HENT:  Head: Normocephalic.  Eyes: EOM are normal.  Neck: Normal range of motion.  Cardiovascular: Normal rate.   Pulmonary/Chest: Effort normal.  Neurological: She is alert.  Skin: Skin is warm.  Psychiatric: She has a normal mood and affect.    Ortho Exam examination the left shoulder demonstrates good active and passive range of motion  positive impingement signs good rotator cuff strength isolated from a sutureless upset muscle testing mild before meals joint tenderness direct palpation no other masses lymph and the skin changes and left shoulder region  Specialty Comments:  No specialty comments available.  Imaging: No results found.   PMFS History: Patient Active Problem List   Diagnosis Date Noted  . Colon cancer screening 06/15/2016  . 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 UTI symptoms 11/03/2015  . Other fatigue 11/03/2015  . Hyperglycemia 07/07/2015  . Ceruminosis 03/07/2015  . Left hip pain 04/26/2014  . Knee pain, left 04/26/2014  . Hyperlipidemia, mixed 03/11/2014   . Annual physical exam 08/06/2013  . Atrophic vaginitis 06/17/2012  . Peripheral vascular disease, unspecified 06/02/2012  . UTI (lower urinary tract infection) 03/23/2012  . Urticaria 10/15/2011  . Anemia 09/21/2011  . 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  . Vitamin D deficiency 02/12/2011  . Peripheral neuropathy ([place]) 02/12/2011  . Intertrigo 01/21/2011  . Asthma 01/14/2011  . 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 History of chicken pox 01/14/2011  . History of measles 01/14/2011  . HAND PAIN 04/09/2010  . OTHER URINARY INCONTINENCE 03/12/2010  . Diabetes mellitus type 2 in obese ([place]) 08/21/2008  . Depression with anxiety 03/06/2008  . Essential hypertension 02/22/2008  . Chronic bronchitis ([place]) 02/22/2008  . DIVERTICULOSIS OF COLON 02/22/2008  . DEGENERATIVE JOINT DISEASE 02/22/2008  . Hypothyroidism 08/23/2007  . Obesity 08/23/2007  . Obstructive sleep apnea 08/23/2007  . Venous (peripheral) insufficiency 08/23/2007  . ALLERGIC RHINITIS 08/23/2007  . GERD 08/23/2007  . BACK PAIN, LUMBAR 08/23/2007   Past Medical History:  Diagnosis Date  . ALLERGIC RHINITIS 08/23/2007  . Allergy    rhinitis  . 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 Anemia 09/21/2011  . Annual physical exam 08/06/2013  . Asthma    mild, intermittent  . Asthma 01/14/2011  . Atrophic vaginitis 06/17/2012  . BACK PAIN, LUMBAR 08/23/2007  . Bronchitis   . BRONCHITIS, RECURRENT 02/22/2008  . Cough    usually not productive  . Degenerative joint disease   . DEGENERATIVE JOINT DISEASE 02/22/2008  . Depression with anxiety 03/06/2008   Qualifier: Diagnosis of  By: Lenna Gilford MD, [place], BORDERLINE 08/21/2008  . Diverticulosis of colon   . DIVERTICULOSIS OF COLON 02/22/2008  . Dysthymia   . 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  . GERD 08/23/2007  . GERD (gastroesophageal reflux disease)   . HAND PAIN 04/09/2010  . History of chicken pox 01/14/2011  . History of measles 01/14/2011  . Hyperglycemia  07/07/2015  . Hypertension   . HYPERTENSION 02/22/2008  . Hypothyroidism   . HYPOTHYROIDISM 08/23/2007  . Incontinence of urine   . Lumbar back pain   . Obesity   . OBESITY 08/23/2007  . Obstructive sleep apnea    does not use CPAP, lost weight  . Other urinary incontinence 03/12/2010  . Peripheral neuropathy ([place]) 02/12/2011   possibly related to spondylolisthesis per pt  . PONV (postoperative nausea and vomiting)   . Sleep apnea   . SLEEP APNEA, OBSTRUCTIVE 08/23/2007  . Transient memory loss March 2006   after a stressful event she had no memory the rest of that day  . Urinary frequency   . Urinary incontinence   . UTI (lower urinary tract infection) 03/23/2012  . Venous insufficiency   . VENOUS INSUFFICIENCY 08/23/2007  . Vitamin D deficiency   . VITAMIN D DEFICIENCY 08/21/2008    Family History  Problem  Relation Age of Onset  . COPD Mother   . Pneumonia Mother   . Other Mother     CHF  . Hypertension Mother   . Arthritis Mother     septic knee after replacement  . Heart disease Mother     chf  . Dementia Father   . Arthritis Brother   . Asthma Daughter   . Colon polyps Daughter   . Stroke Maternal Grandmother   . Heart disease Maternal Grandmother   . Other Maternal Grandfather     CHF  . Heart disease Maternal Grandfather     chf  . Cancer Paternal Grandmother     stomach?  . Other Paternal Grandfather     problems with kidneys  . Kidney disease Paternal Grandfather   . Anxiety disorder Daughter   . Cancer Maternal Aunt     breast  . Cancer Maternal Aunt     liver  . Colon cancer Neg Hx     Past Surgical History:  Procedure Laterality Date  . ABDOMINAL HYSTERECTOMY     took one ovary  . AP repair and sling for cystocele  8-04    Dr Gaynelle Arabian  . APPENDECTOMY    . carpal tunel  2008  . CHOLECYSTECTOMY    . ctr     bilateral  . left hand  2010   left thumb, joint removal, [facility]  . NECK SURGERY     x 2 - discectomy, fusion  . SHOULDER ARTHROSCOPY  DISTAL CLAVICLE EXCISION AND OPEN ROTATOR CUFF REPAIR     right  . TONSILLECTOMY AND ADENOIDECTOMY    . TUBAL LIGATION     Social History   Occupational History  . Not on file.   Social History Main Topics  . Smoking status: Former Smoker    Packs/day: 1.50    Years: 30.00    Types: Cigarettes    Quit date: 10/13/1991  . Smokeless tobacco: Never Used  . Alcohol use No  . Drug use: No  . Sexual activity: No

## 2016-08-13 MED ORDER — METHYLPREDNISOLONE ACETATE 40 MG/ML IJ SUSP
40.0000 mg | INTRAMUSCULAR | Status: AC | PRN
  Administered 2016-08-12: 40 mg via INTRA_ARTICULAR

## 2016-08-13 MED ORDER — BUPIVACAINE HCL 0.5 % IJ SOLN
9.0000 mL | INTRAMUSCULAR | Status: AC | PRN
  Administered 2016-08-12: 9 mL via INTRA_ARTICULAR

## 2016-08-13 MED ORDER — LIDOCAINE HCL 1 % IJ SOLN
5.0000 mL | INTRAMUSCULAR | Status: AC | PRN
  Administered 2016-08-12: 5 mL

## 2016-08-24 ENCOUNTER — Encounter: Payer: Self-pay | Admitting: Gastroenterology

## 2016-08-24 ENCOUNTER — Ambulatory Visit (AMBULATORY_SURGERY_CENTER): Payer: PPO | Admitting: Gastroenterology

## 2016-08-24 VITALS — BP 141/86 | HR 61 | Temp 96.4°F | Resp 20 | Ht 66.0 in | Wt 219.0 lb

## 2016-08-24 DIAGNOSIS — Z1212 Encounter for screening for malignant neoplasm of rectum: Secondary | ICD-10-CM | POA: Diagnosis not present

## 2016-08-24 DIAGNOSIS — Z1211 Encounter for screening for malignant neoplasm of colon: Secondary | ICD-10-CM | POA: Diagnosis not present

## 2016-08-24 MED ORDER — SODIUM CHLORIDE 0.9 % IV SOLN: 500.0000 mL | INTRAVENOUS | Status: DC

## 2016-08-24 NOTE — Op Note (Signed)
[facility] Patient Name: Jenny [name] Procedure Date: 08/24/2016 8:29 AM MRN: [id number] Endoscopist: Ladene Artist , MD Age: 75 Referring MD:  Date of Birth: Nov 09, 1940 Gender: Female Account #: [id number] Procedure:                Colonoscopy Indications:              Screening for colorectal malignant neoplasm Medicines:                Monitored Anesthesia Care Procedure:                Pre-Anesthesia Assessment:                           - Prior to the procedure, a History and Physical                            was performed, and patient medications and                            allergies were reviewed. The patient's tolerance of                            previous anesthesia was also reviewed. The risks                            and benefits of the procedure and the sedation                            options and risks were discussed with the patient.                            All questions were answered, and informed consent                            was obtained. Prior Anticoagulants: The patient has                            taken no previous anticoagulant or antiplatelet                            agents. ASA Grade Assessment: III - A patient with                            severe systemic disease. After reviewing the risks                            and benefits, the patient was deemed in                            satisfactory condition to undergo the procedure.                           After obtaining informed consent, the colonoscope  was passed under direct vision. Throughout the                            procedure, the patient's blood pressure, pulse, and                            oxygen saturations were monitored continuously. The                            Model PCF-[id number]) scope was introduced                            through the anus and advanced to the the cecum,                            identified  by appendiceal orifice and ileocecal                            valve. The ileocecal valve, appendiceal orifice,                            and rectum were photographed. The quality of the                            bowel preparation was excellent. The patient                            tolerated the procedure well. The colonoscopy was                            somewhat difficult due to a tortuous colon.                            Successful completion of the procedure was aided by                            withdrawing and reinserting the scope and applying                            abdominal pressure. Scope In: 8:33:48 AM Scope Out: 8:48:12 AM Scope Withdrawal Time: 0 hours 10 minutes 5 seconds  Total Procedure Duration: 0 hours 14 minutes 24 seconds  Findings:                 The perianal and digital rectal examinations were                            normal.                           Multiple small-mouthed diverticula were found in                            the sigmoid colon and descending colon. There was  evidence of diverticular spasm. No evidence of                            diverticular bleeding.                           A few medium-mouthed diverticula were found in the                            transverse colon. There was no evidence of                            diverticular bleeding.                           The exam was otherwise without abnormality on                            direct and retroflexion views. Complications:            No immediate complications. Estimated blood loss:                            None. Estimated Blood Loss:     Estimated blood loss: none. Impression:               - Moderate diverticulosis in the sigmoid colon and                            in the descending colon.                           - Mild diverticulosis in the transverse colon.                           - The examination was otherwise normal on direct                             and retroflexion views.                           - No specimens collected. Recommendation:           - Patient has a contact number available for                            emergencies. The signs and symptoms of potential                            delayed complications were discussed with the                            patient. Return to normal activities tomorrow.                            Written discharge instructions were provided to the  patient.                           - High fiber diet.                           - Continue present medications.                           - No repeat colonoscopy due to age. Ladene Artist, MD 08/24/2016 8:52:46 AM This report has been signed electronically.

## 2016-08-24 NOTE — Progress Notes (Signed)
A/ox3, pleased with MAC, report to RN 

## 2016-08-24 NOTE — Patient Instructions (Signed)
YOU HAD AN ENDOSCOPIC PROCEDURE TODAY AT [facility]:   Refer to the procedure report that was given to you for any specific questions about what was found during the examination.  If the procedure report does not answer your questions, please call your gastroenterologist to clarify.  If you requested that your care partner not be given the details of your procedure findings, then the procedure report has been included in a sealed envelope for you to review at your convenience later.  YOU SHOULD EXPECT: Some feelings of bloating in the abdomen. Passage of more gas than usual.  Walking can help get rid of the air that was put into your GI tract during the procedure and reduce the bloating. If you had a lower endoscopy (such as a colonoscopy or flexible sigmoidoscopy) you may notice spotting of blood in your stool or on the toilet paper. If you underwent a bowel prep for your procedure, you may not have a normal bowel movement for a few days.  Please Note:  You might notice some irritation and congestion in your nose or some drainage.  This is from the oxygen used during your procedure.  There is no need for concern and it should clear up in a day or so.  SYMPTOMS TO REPORT IMMEDIATELY:   Following lower endoscopy (colonoscopy or flexible sigmoidoscopy):  Excessive amounts of blood in the stool  Significant tenderness or worsening of abdominal pains  Swelling of the abdomen that is new, acute  Fever of 100F or higher    For urgent or emergent issues, a gastroenterologist can be reached at any hour by calling [phone].   DIET:  We do recommend a small meal at first, but then you may proceed to your regular diet.  Drink plenty of fluids but you should avoid alcoholic beverages for 24 hours.  ACTIVITY:  You should plan to take it easy for the rest of today and you should NOT DRIVE or use heavy machinery until tomorrow (because of the sedation medicines used during the test).     FOLLOW UP: Our staff will call the number listed on your records the next business day following your procedure to check on you and address any questions or concerns that you may have regarding the information given to you following your procedure. If we do not reach you, we will leave a message.  However, if you are feeling well and you are not experiencing any problems, there is no need to return our call.  We will assume that you have returned to your regular daily activities without incident.  If any biopsies were taken you will be contacted by phone or by letter within the next 1-3 weeks.  Please call us at [phone] if you have not heard about the biopsies in 3 weeks.    SIGNATURES/CONFIDENTIALITY: You and/or your care partner have signed paperwork which will be entered into your electronic medical record.  These signatures attest to the fact that that the information above on your After Visit Summary has been reviewed and is understood.  Full responsibility of the confidentiality of this discharge information lies with you and/or your care-partner.    Resume medications. Information given on diverticulosis and high fiber diet.

## 2016-08-25 ENCOUNTER — Telehealth: Payer: Self-pay | Admitting: *Deleted

## 2016-08-25 NOTE — Telephone Encounter (Signed)
  Follow up Call-  Call back number 08/24/2016  Post procedure Call Back phone  # [phone]  Permission to leave phone message Yes  Some recent data might be hidden     Patient questions:  Do you have a fever, pain , or abdominal swelling? No. Pain Score  0 *  Have you tolerated food without any problems? Yes.    Have you been able to return to your normal activities? Yes.    Do you have any questions about your discharge instructions: Diet   No. Medications  No. Follow up visit  No.  Do you have questions or concerns about your Care? No.  Actions: * If pain score is 4 or above: No action needed, pain <4.

## 2016-08-29 ENCOUNTER — Other Ambulatory Visit: Payer: Self-pay | Admitting: Family Medicine

## 2016-09-14 ENCOUNTER — Telehealth: Payer: Self-pay | Admitting: Family Medicine

## 2016-09-14 MED ORDER — ALPRAZOLAM 0.5 MG PO TABS
0.5000 mg | ORAL_TABLET | Freq: Two times a day (BID) | ORAL | 2 refills | Status: DC | PRN
Start: 2016-09-14 — End: 2016-12-24

## 2016-09-14 NOTE — Telephone Encounter (Signed)
OK to refill With same sig, #60 no further refills til seen

## 2016-09-14 NOTE — Telephone Encounter (Signed)
Faxed hardcopy for Alprazolam  To [facility] in [place]

## 2016-09-14 NOTE — Telephone Encounter (Signed)
Requesting:  Alprazolam Contract    11/12/2014 UDS     No UDS Last OV   06/02/2016 Last Refill    #60 with 2 refills on 06/02/2016  Please Advise   [facility] in [place]--fax number [phone]

## 2016-09-21 ENCOUNTER — Other Ambulatory Visit: Payer: Self-pay | Admitting: Family Medicine

## 2016-10-14 ENCOUNTER — Ambulatory Visit (INDEPENDENT_AMBULATORY_CARE_PROVIDER_SITE_OTHER): Payer: Medicare Other | Admitting: Orthopedic Surgery

## 2016-10-14 DIAGNOSIS — M25562 Pain in left knee: Secondary | ICD-10-CM | POA: Diagnosis not present

## 2016-10-14 DIAGNOSIS — M1712 Unilateral primary osteoarthritis, left knee: Secondary | ICD-10-CM | POA: Diagnosis not present

## 2016-10-14 DIAGNOSIS — M25462 Effusion, left knee: Secondary | ICD-10-CM | POA: Diagnosis not present

## 2016-10-14 MED ORDER — LIDOCAINE HCL 1 % IJ SOLN
5.0000 mL | INTRAMUSCULAR | Status: AC | PRN
  Administered 2016-10-14: 5 mL

## 2016-10-14 MED ORDER — METHYLPREDNISOLONE ACETATE 40 MG/ML IJ SUSP
40.0000 mg | INTRAMUSCULAR | Status: AC | PRN
  Administered 2016-10-14: 40 mg via INTRA_ARTICULAR

## 2016-10-14 NOTE — Progress Notes (Signed)
Office Visit Note   Patient: Jenny [name]           Date of Birth: [birth year]           MRN: [id number] Visit Date: 10/14/2016 Requested by: Mosie Lukes, MD [address] [place], [place] [address] PCP: Penni Homans, MD  Subjective: Chief Complaint  Patient presents with  . Left Knee - Pain, Edema    Patient is here complaining of left knee pain and swelling.  Sudden onset 10/11/16, worsening.  The lateral calf/knee hurts her.  She was unable to walk/weight bear much yesterday.  She had a few prednisone tablets from an old medrol dosepak left, and she took those.  She has had a little relief with those.  Last time her left knee flared, it was from her back.  Dr Joya Salm has seen her in past, and had given her the medrol dosepak in the past.                  Review of Systems   Assessment & Plan: Visit Diagnoses:  1. Acute pain of left knee   2. Swelling of left knee joint   3. Unilateral primary osteoarthritis, left knee     Plan: Left knee injected. Patient still has some radicular symptoms and she will follow up with neurosurgery for possible repeat steroid injection in her back. She will follow up here as needed for injection in the left knee.  Follow-Up Instructions: Return if symptoms worsen or fail to improve.   Orders:  Orders Placed This Encounter  Procedures  . Large Joint Injection/Arthrocentesis   No orders of the defined types were placed in this encounter.     Procedures: Large Joint Inj Date/Time: 10/14/2016 12:45 PM Performed by: Ryelan Kazee V Authorized by: Newt Minion   Consent Given by:  Patient Site marked: the procedure site was marked   Timeout: prior to procedure the correct patient, procedure, and site was verified   Indications:  Pain and diagnostic evaluation Location:  Knee Site:  L knee Prep: patient was prepped and draped in usual sterile fashion   Needle Size:  22 G Needle Length:  1.5 inches Approach:   Anteromedial Ultrasound Guidance: No   Fluoroscopic Guidance: No   Arthrogram: No   Medications:  5 mL lidocaine 1 %; 40 mg methylPREDNISolone acetate 40 MG/ML Aspiration Attempted: No   Patient tolerance:  Patient tolerated the procedure well with no immediate complications     Clinical Data: No additional findings.  Objective: Vital Signs: There were no vitals taken for this visit.  Physical Exam examination patient is alert oriented no adenopathy well-dressed normal affect normal respiratory effort she does have an antalgic gait. She lacks about 5 of full extension left knee flexion to 90. She is painful palpation of the medial lateral joint line close increase in stable she is tender to palpation the patellofemoral joint. She has crepitation with range of motion. Palpation reproduces her knee pain. She has a negative straight leg raise.  Ortho Exam  Specialty Comments:  No specialty comments available.  Imaging: No results found.   PMFS History: Patient Active Problem List   Diagnosis Date Noted  . Colon cancer screening 06/15/2016  . COPD mixed type ([place]) 02/10/2016  . UTI symptoms 11/03/2015  . Other fatigue 11/03/2015  . Hyperglycemia 07/07/2015  . Ceruminosis 03/07/2015  . Left hip pain 04/26/2014  . Knee pain, left 04/26/2014  . Hyperlipidemia,  mixed 03/11/2014  . Annual physical exam 08/06/2013  . Atrophic vaginitis 06/17/2012  . Peripheral vascular disease, unspecified 06/02/2012  . UTI (lower urinary tract infection) 03/23/2012  . Urticaria 10/15/2011  . Anemia 09/21/2011  . 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  . Vitamin D deficiency 02/12/2011  . Peripheral neuropathy ([place]) 02/12/2011  . Intertrigo 01/21/2011  . Asthma 01/14/2011  . 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 History of chicken pox 01/14/2011  . History of measles 01/14/2011  . HAND PAIN 04/09/2010  . OTHER URINARY INCONTINENCE 03/12/2010  . Diabetes mellitus type 2 in obese  ([place]) 08/21/2008  . Depression with anxiety 03/06/2008  . Essential hypertension 02/22/2008  . Chronic bronchitis ([place]) 02/22/2008  . DIVERTICULOSIS OF COLON 02/22/2008  . Unilateral primary osteoarthritis, left knee 02/22/2008  . Hypothyroidism 08/23/2007  . Obesity 08/23/2007  . Obstructive sleep apnea 08/23/2007  . Venous (peripheral) insufficiency 08/23/2007  . ALLERGIC RHINITIS 08/23/2007  . GERD 08/23/2007  . BACK PAIN, LUMBAR 08/23/2007   Past Medical History:  Diagnosis Date  . ALLERGIC RHINITIS 08/23/2007  . Allergy    rhinitis  . 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 Anemia 09/21/2011  . Annual physical exam 08/06/2013  . Asthma    mild, intermittent  . Asthma 01/14/2011  . Atrophic vaginitis 06/17/2012  . BACK PAIN, LUMBAR 08/23/2007  . Bronchitis   . BRONCHITIS, RECURRENT 02/22/2008  . Cough    usually not productive  . Degenerative joint disease   . DEGENERATIVE JOINT DISEASE 02/22/2008  . Depression with anxiety 03/06/2008   Qualifier: Diagnosis of  By: Lenna Gilford MD, [place], BORDERLINE 08/21/2008  . Diverticulosis of colon   . DIVERTICULOSIS OF COLON 02/22/2008  . Dysthymia   . 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  . GERD 08/23/2007  . GERD (gastroesophageal reflux disease)   . HAND PAIN 04/09/2010  . History of chicken pox 01/14/2011  . History of measles 01/14/2011  . Hyperglycemia 07/07/2015  . Hypertension   . HYPERTENSION 02/22/2008  . Hypothyroidism   . HYPOTHYROIDISM 08/23/2007  . Incontinence of urine   . Lumbar back pain   . Obesity   . OBESITY 08/23/2007  . Obstructive sleep apnea    does not use CPAP, lost weight  . Other urinary incontinence 03/12/2010  . Peripheral neuropathy ([place]) 02/12/2011   possibly related to spondylolisthesis per pt  . PONV (postoperative nausea and vomiting)   . Sleep apnea   . SLEEP APNEA, OBSTRUCTIVE 08/23/2007  . Transient memory loss March 2006   after a stressful event she had no memory the rest of that day  .  Urinary frequency   . Urinary incontinence   . UTI (lower urinary tract infection) 03/23/2012  . Venous insufficiency   . VENOUS INSUFFICIENCY 08/23/2007  . Vitamin D deficiency   . VITAMIN D DEFICIENCY 08/21/2008    Family History  Problem Relation Age of Onset  . COPD Mother   . Pneumonia Mother   . Other Mother     CHF  . Hypertension Mother   . Arthritis Mother     septic knee after replacement  . Heart disease Mother     chf  . Dementia Father   . Arthritis Brother   . Asthma Daughter   . Colon polyps Daughter   . Stroke Maternal Grandmother   . Heart disease Maternal Grandmother   . Other Maternal Grandfather     CHF  . Heart disease Maternal Grandfather  chf  . Cancer Paternal Grandmother     stomach?  . Other Paternal Grandfather     problems with kidneys  . Kidney disease Paternal Grandfather   . Anxiety disorder Daughter   . Cancer Maternal Aunt     breast  . Cancer Maternal Aunt     liver  . Colon cancer Neg Hx     Past Surgical History:  Procedure Laterality Date  . ABDOMINAL HYSTERECTOMY     took one ovary  . AP repair and sling for cystocele  8-04    Dr Gaynelle Arabian  . APPENDECTOMY    . carpal tunel  2008  . CHOLECYSTECTOMY    . ctr     bilateral  . left hand  2010   left thumb, joint removal, CTR  . NECK SURGERY     x 2 - discectomy, fusion  . SHOULDER ARTHROSCOPY DISTAL CLAVICLE EXCISION AND OPEN ROTATOR CUFF REPAIR     right  . TONSILLECTOMY AND ADENOIDECTOMY    . TUBAL LIGATION     Social History   Occupational History  . Not on file.   Social History Main Topics  . Smoking status: Former Smoker    Packs/day: 1.50    Years: 30.00    Types: Cigarettes    Quit date: 10/13/1991  . Smokeless tobacco: Never Used  . Alcohol use No  . Drug use: No  . Sexual activity: No

## 2016-10-19 ENCOUNTER — Encounter (INDEPENDENT_AMBULATORY_CARE_PROVIDER_SITE_OTHER): Payer: Self-pay | Admitting: Orthopedic Surgery

## 2016-10-19 ENCOUNTER — Ambulatory Visit (INDEPENDENT_AMBULATORY_CARE_PROVIDER_SITE_OTHER): Payer: Medicare Other | Admitting: Orthopedic Surgery

## 2016-10-19 ENCOUNTER — Ambulatory Visit (INDEPENDENT_AMBULATORY_CARE_PROVIDER_SITE_OTHER): Payer: Medicare Other

## 2016-10-19 DIAGNOSIS — M25562 Pain in left knee: Secondary | ICD-10-CM | POA: Diagnosis not present

## 2016-10-19 MED ORDER — TRAMADOL HCL 50 MG PO TABS: 50.0000 mg | ORAL_TABLET | Freq: Four times a day (QID) | ORAL | 0 refills | Status: DC | PRN

## 2016-10-20 NOTE — Progress Notes (Signed)
Office Visit Note   Patient: Jenny [name]           Date of Birth: 1941/07/11           MRN: [id number] Visit Date: 10/19/2016 Requested by: Jenny Lukes, MD [address] [place], [place] [address] PCP: Jenny Homans, MD  Subjective: Chief Complaint  Patient presents with  . Left Knee - Pain, Edema    HPI Jenny [name] is a 76 year old female with left knee pain.  She really reports back pain and radicular type pain affecting the left knee.  Onset 10/10/2016.  She states she cannot walk last week.  She saw Dr. Sharol [name] 10/14/2016 and he injected it and this did not give her any relief even for the first hour or 2 after the injection.  She is walking with a cane today.  Reports pain in the posterior knee down to the foot along with numbness and tingling the foot.  Notably she did have back surgery in 2013.  No history of gout or pseudogout.  Takes ibuprofen and ice.  Has a grandson who had issues with pain meds that she's not to mention in taking too much pain medicine.  She states that the left leg has been giving way.  She tried Voltaren cream.              Review of Systems All systems reviewed are negative as they relate to the chief complaint within the history of present illness.  Patient denies  fevers or chills.    Assessment & Plan: Visit Diagnoses:  1. Acute pain of left knee     Plan: Impression is low back pain with radiculopathy.  I trialed some tramadol.  She is seeing Dr. Sherwood [name] on general 23rd.  I think based on her fairly incapacitating symptoms and normal x-rays of the knee that she would do well to have a CT myelogram of the lumbar spine to evaluate this left-sided radiculopathy.  I'll call her with those results once they come in but it would be nice for her to have some piece of information with which Dr. Rita [name] can make a good clinical decision.  Follow-Up Instructions: Return if symptoms worsen or fail to improve.   Orders:  Orders Placed This  Encounter  Procedures  . XR Knee 1-2 Views Left  . CT LUMBAR SPINE W CONTRAST  . DG Myelogram Lumbar   Meds ordered this encounter  Medications  . traMADol (ULTRAM) 50 MG tablet    Sig: Take 1 tablet (50 mg total) by mouth every 6 (six) hours as needed.    Dispense:  30 tablet    Refill:  0      Procedures: No procedures performed   Clinical Data: No additional findings.  Objective: Vital Signs: There were no vitals taken for this visit.  Physical Exam   Constitutional: Patient appears well-developed HEENT:  Head: Normocephalic Eyes:EOM are normal Neck: Normal range of motion Cardiovascular: Normal rate Pulmonary/chest: Effort normal Neurologic: Patient is alert Skin: Skin is warm Psychiatric: Patient has normal mood and affect    Ortho Exam examination of the left knee demonstrates trace effusion full range of motion stable collateral crucial ligaments intact extensor mechanism palpable pedal pulses no nerve retention signs today no groin pain with internal/external rotation of the leg no other masses lymph adenopathy or skin changes noted in the left leg region.  She has well-healed surgical incision on her back with no trochanteric tenderness noted  Specialty Comments:  No specialty comments available.  Imaging: No results found.   PMFS History: Patient Active Problem List   Diagnosis Date Noted  . Colon cancer screening 06/15/2016  . COPD mixed type (Jenny [name]) 02/10/2016  . UTI symptoms 11/03/2015  . Other fatigue 11/03/2015  . Hyperglycemia 07/07/2015  . Ceruminosis 03/07/2015  . Left hip pain 04/26/2014  . Acute pain of left knee 04/26/2014  . Hyperlipidemia, mixed 03/11/2014  . Annual physical exam 08/06/2013  . Atrophic vaginitis 06/17/2012  . Peripheral vascular disease, unspecified 06/02/2012  . UTI (lower urinary tract infection) 03/23/2012  . Urticaria 10/15/2011  . Anemia 09/21/2011  . 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  . Vitamin  D deficiency 02/12/2011  . Peripheral neuropathy (Jenny [name]) 02/12/2011  . Intertrigo 01/21/2011  . Asthma 01/14/2011  . 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 History of chicken pox 01/14/2011  . History of measles 01/14/2011  . HAND PAIN 04/09/2010  . OTHER URINARY INCONTINENCE 03/12/2010  . Diabetes mellitus type 2 in obese (Jenny [name]) 08/21/2008  . Depression with anxiety 03/06/2008  . Essential hypertension 02/22/2008  . Chronic bronchitis (Jenny [name]) 02/22/2008  . DIVERTICULOSIS OF COLON 02/22/2008  . Unilateral primary osteoarthritis, left knee 02/22/2008  . Hypothyroidism 08/23/2007  . Obesity 08/23/2007  . Obstructive sleep apnea 08/23/2007  . Venous (peripheral) insufficiency 08/23/2007  . ALLERGIC RHINITIS 08/23/2007  . GERD 08/23/2007  . BACK PAIN, LUMBAR 08/23/2007   Past Medical History:  Diagnosis Date  . ALLERGIC RHINITIS 08/23/2007  . Allergy    rhinitis  . 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 Anemia 09/21/2011  . Annual physical exam 08/06/2013  . Asthma    mild, intermittent  . Asthma 01/14/2011  . Atrophic vaginitis 06/17/2012  . BACK PAIN, LUMBAR 08/23/2007  . Bronchitis   . BRONCHITIS, RECURRENT 02/22/2008  . Cough    usually not productive  . Degenerative joint disease   . DEGENERATIVE JOINT DISEASE 02/22/2008  . Depression with anxiety 03/06/2008   Qualifier: Diagnosis of  By: Jenny Gilford MD, [place], BORDERLINE 08/21/2008  . Diverticulosis of colon   . DIVERTICULOSIS OF COLON 02/22/2008  . Dysthymia   . 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  . GERD 08/23/2007  . GERD (gastroesophageal reflux disease)   . HAND PAIN 04/09/2010  . History of chicken pox 01/14/2011  . History of measles 01/14/2011  . Hyperglycemia 07/07/2015  . Hypertension   . HYPERTENSION 02/22/2008  . Hypothyroidism   . HYPOTHYROIDISM 08/23/2007  . Incontinence of urine   . Lumbar back pain   . Obesity   . OBESITY 08/23/2007  . Obstructive sleep apnea    does not use CPAP, lost weight  . Other  urinary incontinence 03/12/2010  . Peripheral neuropathy ([place]) 02/12/2011   possibly related to spondylolisthesis per pt  . PONV (postoperative nausea and vomiting)   . Sleep apnea   . SLEEP APNEA, OBSTRUCTIVE 08/23/2007  . Transient memory loss March 2006   after a stressful event she had no memory the rest of that day  . Urinary frequency   . Urinary incontinence   . UTI (lower urinary tract infection) 03/23/2012  . Venous insufficiency   . VENOUS INSUFFICIENCY 08/23/2007  . Vitamin D deficiency   . VITAMIN D DEFICIENCY 08/21/2008    Family History  Problem Relation Age of Onset  . COPD Mother   . Pneumonia Mother   . Other Mother     CHF  . Hypertension Mother   .  Arthritis Mother     septic knee after replacement  . Heart disease Mother     chf  . Dementia Father   . Arthritis Brother   . Asthma Daughter   . Colon polyps Daughter   . Stroke Maternal Grandmother   . Heart disease Maternal Grandmother   . Other Maternal Grandfather     CHF  . Heart disease Maternal Grandfather     chf  . Cancer Paternal Grandmother     stomach?  . Other Paternal Grandfather     problems with kidneys  . Kidney disease Paternal Grandfather   . Anxiety disorder Daughter   . Cancer Maternal Aunt     breast  . Cancer Maternal Aunt     liver  . Colon cancer Neg Hx     Past Surgical History:  Procedure Laterality Date  . ABDOMINAL HYSTERECTOMY     took one ovary  . AP repair and sling for cystocele  8-04    Dr Gaynelle Arabian  . APPENDECTOMY    . carpal tunel  2008  . CHOLECYSTECTOMY    . ctr     bilateral  . left hand  2010   left thumb, joint removal, CTR  . NECK SURGERY     x 2 - discectomy, fusion  . SHOULDER ARTHROSCOPY DISTAL CLAVICLE EXCISION AND OPEN ROTATOR CUFF REPAIR     right  . TONSILLECTOMY AND ADENOIDECTOMY    . TUBAL LIGATION     Social History   Occupational History  . Not on file.   Social History Main Topics  . Smoking status: Former Smoker    Packs/day:  1.50    Years: 30.00    Types: Cigarettes    Quit date: 10/13/1991  . Smokeless tobacco: Never Used  . Alcohol use No  . Drug use: No  . Sexual activity: No

## 2016-10-22 ENCOUNTER — Other Ambulatory Visit (INDEPENDENT_AMBULATORY_CARE_PROVIDER_SITE_OTHER): Payer: Self-pay | Admitting: Orthopedic Surgery

## 2016-10-22 ENCOUNTER — Other Ambulatory Visit: Payer: Self-pay | Admitting: Medical

## 2016-10-22 DIAGNOSIS — M79605 Pain in left leg: Secondary | ICD-10-CM

## 2016-10-22 DIAGNOSIS — M545 Low back pain: Secondary | ICD-10-CM

## 2016-10-26 ENCOUNTER — Other Ambulatory Visit: Payer: PPO

## 2016-10-26 ENCOUNTER — Ambulatory Visit
Admission: RE | Admit: 2016-10-26 | Discharge: 2016-10-26 | Disposition: A | Discharge status: Home / Self Care | Payer: Medicare Other | Source: Ambulatory Visit | Attending: Orthopedic Surgery | Admitting: Orthopedic Surgery

## 2016-10-26 ENCOUNTER — Ambulatory Visit: Payer: PPO | Admitting: Pulmonary Disease

## 2016-10-26 ENCOUNTER — Inpatient Hospital Stay: Admission: RE | Admit: 2016-10-26 | Payer: PPO | Source: Ambulatory Visit

## 2016-10-26 ENCOUNTER — Other Ambulatory Visit (INDEPENDENT_AMBULATORY_CARE_PROVIDER_SITE_OTHER): Payer: Self-pay | Admitting: Orthopedic Surgery

## 2016-10-26 DIAGNOSIS — M79605 Pain in left leg: Secondary | ICD-10-CM

## 2016-10-26 DIAGNOSIS — M545 Low back pain: Secondary | ICD-10-CM

## 2016-10-26 DIAGNOSIS — M5126 Other intervertebral disc displacement, lumbar region: Secondary | ICD-10-CM | POA: Diagnosis not present

## 2016-10-26 MED ORDER — MEPERIDINE HCL 100 MG/ML IJ SOLN
50.0000 mg | Freq: Once | INTRAMUSCULAR | Status: AC
  Administered 2016-10-26: 50 mg via INTRAMUSCULAR

## 2016-10-26 MED ORDER — IOPAMIDOL (ISOVUE-M 200) INJECTION 41%
15.0000 mL | Freq: Once | INTRAMUSCULAR | Status: AC
  Administered 2016-10-26: 15 mL via INTRATHECAL

## 2016-10-26 MED ORDER — DIAZEPAM 5 MG PO TABS
5.0000 mg | ORAL_TABLET | Freq: Once | ORAL | Status: AC
  Administered 2016-10-26: 5 mg via ORAL

## 2016-10-26 MED ORDER — ONDANSETRON HCL 4 MG/2ML IJ SOLN
4.0000 mg | Freq: Once | INTRAMUSCULAR | Status: AC
  Administered 2016-10-26: 4 mg via INTRAMUSCULAR

## 2016-10-26 NOTE — Telephone Encounter (Signed)
Spoke with Truman Hayward at [facility]. He states they received Rx from [place] on 09/14/16 with 1 refill. States pt had called in current request from an old bottle and they have already filled rx from 09/14/16 Rx. Pt has already picked up Rx. Current request denied.

## 2016-10-26 NOTE — Discharge Instructions (Signed)

## 2016-10-27 ENCOUNTER — Telehealth (INDEPENDENT_AMBULATORY_CARE_PROVIDER_SITE_OTHER): Payer: Self-pay | Admitting: Orthopedic Surgery

## 2016-10-27 DIAGNOSIS — G4733 Obstructive sleep apnea (adult) (pediatric): Secondary | ICD-10-CM | POA: Diagnosis not present

## 2016-10-27 NOTE — Telephone Encounter (Signed)
IC patient and advised her you would call her Wednesday. See note.

## 2016-10-27 NOTE — Telephone Encounter (Signed)
Pt had a mylogram yesterday and pt would like a call back to discuss the results. She stated she was very concerned about exactly what is on it based on what the radiographer was saying.  Pt number is [phone]

## 2016-10-30 NOTE — Telephone Encounter (Signed)
I called she has nsu appt

## 2016-11-02 ENCOUNTER — Other Ambulatory Visit: Payer: Self-pay | Admitting: Family Medicine

## 2016-11-03 DIAGNOSIS — M546 Pain in thoracic spine: Secondary | ICD-10-CM | POA: Diagnosis not present

## 2016-11-03 DIAGNOSIS — M542 Cervicalgia: Secondary | ICD-10-CM | POA: Diagnosis not present

## 2016-11-03 DIAGNOSIS — M5416 Radiculopathy, lumbar region: Secondary | ICD-10-CM | POA: Diagnosis not present

## 2016-11-03 DIAGNOSIS — M48062 Spinal stenosis, lumbar region with neurogenic claudication: Secondary | ICD-10-CM | POA: Diagnosis not present

## 2016-11-03 DIAGNOSIS — M4726 Other spondylosis with radiculopathy, lumbar region: Secondary | ICD-10-CM | POA: Diagnosis not present

## 2016-11-09 ENCOUNTER — Ambulatory Visit (INDEPENDENT_AMBULATORY_CARE_PROVIDER_SITE_OTHER): Payer: Medicare Other | Admitting: Family Medicine

## 2016-11-09 ENCOUNTER — Encounter: Payer: Self-pay | Admitting: Family Medicine

## 2016-11-09 VITALS — BP 122/68 | HR 83 | Temp 98.6°F | Resp 17 | Ht 66.0 in | Wt 215.4 lb

## 2016-11-09 DIAGNOSIS — E782 Mixed hyperlipidemia: Secondary | ICD-10-CM | POA: Diagnosis not present

## 2016-11-09 DIAGNOSIS — J449 Chronic obstructive pulmonary disease, unspecified: Secondary | ICD-10-CM

## 2016-11-09 DIAGNOSIS — Z1231 Encounter for screening mammogram for malignant neoplasm of breast: Secondary | ICD-10-CM

## 2016-11-09 DIAGNOSIS — E039 Hypothyroidism, unspecified: Secondary | ICD-10-CM | POA: Diagnosis not present

## 2016-11-09 DIAGNOSIS — Z780 Asymptomatic menopausal state: Secondary | ICD-10-CM

## 2016-11-09 DIAGNOSIS — I10 Essential (primary) hypertension: Secondary | ICD-10-CM

## 2016-11-09 LAB — HEPATIC FUNCTION PANEL
ALT: 10 U/L (ref 0–35)
AST: 13 U/L (ref 0–37)
Albumin: 4.1 g/dL (ref 3.5–5.2)
Alkaline Phosphatase: 82 U/L (ref 39–117)
Bilirubin, Direct: 0.2 mg/dL (ref 0.0–0.3)
Total Bilirubin: 0.5 mg/dL (ref 0.2–1.2)
Total Protein: 7 g/dL (ref 6.0–8.3)

## 2016-11-09 LAB — LIPID PANEL
Cholesterol: 129 mg/dL (ref 0–200)
HDL: 44.6 mg/dL (ref >39.00)
LDL Cholesterol: 72 mg/dL (ref 0–99)
NonHDL: 84.89
Total CHOL/HDL Ratio: 3
Triglycerides: 66 mg/dL (ref 0.0–149.0)
VLDL: 13.2 mg/dL (ref 0.0–40.0)

## 2016-11-09 LAB — TSH: TSH: 0.89 u[IU]/mL (ref 0.35–4.50)

## 2016-11-09 LAB — CBC WITH DIFFERENTIAL/PLATELET
Basophils Absolute: 0.1 10*3/uL (ref 0.0–0.1)
Basophils Relative: 0.9 % (ref 0.0–3.0)
Eosinophils Absolute: 0.6 10*3/uL (ref 0.0–0.7)
Eosinophils Relative: 6.4 % — ABNORMAL HIGH (ref 0.0–5.0)
HCT: 42.6 % (ref 36.0–46.0)
Hemoglobin: 14.2 g/dL (ref 12.0–15.0)
Lymphocytes Relative: 25.4 % (ref 12.0–46.0)
Lymphs Abs: 2.4 10*3/uL (ref 0.7–4.0)
MCHC: 33.3 g/dL (ref 30.0–36.0)
MCV: 89.2 fL (ref 78.0–100.0)
Monocytes Absolute: 0.7 10*3/uL (ref 0.1–1.0)
Monocytes Relative: 7 % (ref 3.0–12.0)
Neutro Abs: 5.8 10*3/uL (ref 1.4–7.7)
Neutrophils Relative %: 60.3 % (ref 43.0–77.0)
Platelets: 290 10*3/uL (ref 150.0–400.0)
RBC: 4.77 Mil/uL (ref 3.87–5.11)
RDW: 14.1 % (ref 11.5–15.5)
WBC: 9.6 10*3/uL (ref 4.0–10.5)

## 2016-11-09 LAB — BASIC METABOLIC PANEL
BUN: 18 mg/dL (ref 6–23)
CO2: 31 mEq/L (ref 19–32)
Calcium: 10.1 mg/dL (ref 8.4–10.5)
Chloride: 104 mEq/L (ref 96–112)
Creatinine, Ser: 0.97 mg/dL (ref 0.40–1.20)
GFR: 59.03 mL/min — ABNORMAL LOW (ref >60.00)
Glucose, Bld: 99 mg/dL (ref 70–99)
Potassium: 5.8 mEq/L — ABNORMAL HIGH (ref 3.5–5.1)
Sodium: 142 meq/L (ref 135–145)

## 2016-11-09 NOTE — Assessment & Plan Note (Signed)
Chronic problem.  Overdue for repeat labs.  Not currently on a statin and attempting to control w/ healthy diet.  Check labs.  Start meds prn.

## 2016-11-09 NOTE — Assessment & Plan Note (Signed)
Chronic problem.  Currently asymptomatic.  Check labs.  Adjust meds prn  

## 2016-11-09 NOTE — Patient Instructions (Signed)
Schedule your Annual Wellness Visit w/ Maudie Mercury and a physical with me in 6 months We'll notify you of your lab results and make any changes if needed Continue to work on healthy diet and regular exercise- you can do it! We'll call you with your mammogram and bone density appt Call with any questions or concerns Welcome!  We're glad to have you!!!

## 2016-11-09 NOTE — Assessment & Plan Note (Signed)
Chronic problem.  Following w/ Dr Lenna Gilford.  On Symbicort, Singulair, and albuterol prn.  Currently asymptomatic.  Will follow along and assist as able.

## 2016-11-09 NOTE — Progress Notes (Signed)
Pre visit review using our clinic review tool, if applicable. No additional management support is needed unless otherwise documented below in the visit note. 

## 2016-11-09 NOTE — Assessment & Plan Note (Signed)
Chronic problem.  Well controlled today.  Asymptomatic.  Check labs.  No anticipated med changes. 

## 2016-11-09 NOTE — Progress Notes (Signed)
   Subjective:    Patient ID: Jenny [name], female    DOB: 11/17/1940, 76 y.o.   MRN: [id number]  HPI Pt is transferring care from Dr Charlett Blake  COPD- chronic problem, on Symbicort and Singulair and following w/ Dr Lenna Gilford.  Using Proair as needed.  Pt reports sxs are well controlled.  Denies SOB, wheezing.  HTN- chronic problem, on Lisinopril 5mg .  Denies CP, SOB, HAs, visual changes, edema.  Hyperlipidemia- chronic problem, currently diet controlled.  Not currently on medication.  Last lipids were 02/15/15.  Down 4 lbs from November.  Hypothyroid- chronic problem, on Levothyroxine 122mcg daily.  Denies excessive fatigue.  No cold intolerance.  No changes to skin/hair/nails.  Health Maintenance- due for mammo, DEXA.   Review of Systems For ROS see HPI     Objective:   Physical Exam  Constitutional: She is oriented to person, place, and time. She appears well-developed and well-nourished. No distress.  obese  HENT:  Head: Normocephalic and atraumatic.  Eyes: Conjunctivae and EOM are normal. Pupils are equal, round, and reactive to light.  Neck: Normal range of motion. Neck supple. No thyromegaly present.  Cardiovascular: Normal rate, regular rhythm, normal heart sounds and intact distal pulses.   No murmur heard. Pulmonary/Chest: Effort normal and breath sounds normal. No respiratory distress.  Abdominal: Soft. She exhibits no distension. There is no tenderness.  Musculoskeletal: She exhibits no edema.  Lymphadenopathy:    She has no cervical adenopathy.  Neurological: She is alert and oriented to person, place, and time.  Skin: Skin is warm and dry.  Psychiatric: She has a normal mood and affect. Her behavior is normal.  Vitals reviewed.         Assessment & Plan:

## 2016-11-10 ENCOUNTER — Other Ambulatory Visit: Payer: Self-pay | Admitting: Family Medicine

## 2016-11-10 DIAGNOSIS — E875 Hyperkalemia: Secondary | ICD-10-CM

## 2016-11-12 ENCOUNTER — Other Ambulatory Visit (INDEPENDENT_AMBULATORY_CARE_PROVIDER_SITE_OTHER): Payer: Medicare Other

## 2016-11-12 DIAGNOSIS — E875 Hyperkalemia: Secondary | ICD-10-CM | POA: Diagnosis not present

## 2016-11-12 DIAGNOSIS — M5416 Radiculopathy, lumbar region: Secondary | ICD-10-CM | POA: Diagnosis not present

## 2016-11-12 DIAGNOSIS — M4726 Other spondylosis with radiculopathy, lumbar region: Secondary | ICD-10-CM | POA: Diagnosis not present

## 2016-11-12 DIAGNOSIS — M5136 Other intervertebral disc degeneration, lumbar region: Secondary | ICD-10-CM | POA: Diagnosis not present

## 2016-11-12 DIAGNOSIS — M48061 Spinal stenosis, lumbar region without neurogenic claudication: Secondary | ICD-10-CM | POA: Diagnosis not present

## 2016-11-12 LAB — BASIC METABOLIC PANEL
BUN: 18 mg/dL (ref 6–23)
CO2: 32 meq/L (ref 19–32)
Calcium: 9.7 mg/dL (ref 8.4–10.5)
Chloride: 106 meq/L (ref 96–112)
Creatinine, Ser: 1.03 mg/dL (ref 0.40–1.20)
GFR: 55.08 mL/min — ABNORMAL LOW (ref >60.00)
Glucose, Bld: 154 mg/dL — ABNORMAL HIGH (ref 70–99)
Potassium: 5.7 meq/L — ABNORMAL HIGH (ref 3.5–5.1)
Sodium: 141 mEq/L (ref 135–145)

## 2016-11-13 ENCOUNTER — Other Ambulatory Visit: Payer: Medicare Other

## 2016-11-16 ENCOUNTER — Telehealth: Payer: Self-pay | Admitting: Family Medicine

## 2016-11-16 ENCOUNTER — Other Ambulatory Visit: Payer: Self-pay | Admitting: Family Medicine

## 2016-11-16 DIAGNOSIS — I10 Essential (primary) hypertension: Secondary | ICD-10-CM

## 2016-11-16 MED ORDER — AMLODIPINE BESYLATE 5 MG PO TABS: 5.0000 mg | ORAL_TABLET | Freq: Every day | ORAL | 0 refills | Status: DC

## 2016-11-16 NOTE — Telephone Encounter (Signed)
Pt states that she was told to stop taking BP meds through Mychart and to come in for f/u in 2 wks, however Friday night pt states that her BP spiked to 196/110 and heart rate of 105. Pt called 911 and had it ck again by them, pt decided to start back on BP meds, pt asking what should she do, please advise.

## 2016-11-16 NOTE — Telephone Encounter (Signed)
STOP Lisinopril.  START Amlodipine 5mg  daily to control BP and come in for BP check as scheduled

## 2016-11-16 NOTE — Telephone Encounter (Signed)
Opened in error

## 2016-11-16 NOTE — Telephone Encounter (Signed)
Patient calling back to report her bp was in the 130 range when she check it this afternoon.  She wants to know if pcp still wants her to change medication as discussed earlier this morning.

## 2016-11-16 NOTE — Telephone Encounter (Signed)
Patient has been informed of information.  Stated verbal understanding.. Amlodipine has been called in for patient, and she has been reminded of her follow-up next week.

## 2016-11-16 NOTE — Telephone Encounter (Signed)
Can you advise pt.  

## 2016-11-17 NOTE — Telephone Encounter (Signed)
Called pt and left a detailed message on voicemail

## 2016-11-17 NOTE — Telephone Encounter (Signed)
Yes- I would still start the Amlodipine as this is low dose

## 2016-11-24 ENCOUNTER — Ambulatory Visit (INDEPENDENT_AMBULATORY_CARE_PROVIDER_SITE_OTHER): Payer: Medicare Other | Admitting: Family Medicine

## 2016-11-24 ENCOUNTER — Encounter: Payer: Self-pay | Admitting: Family Medicine

## 2016-11-24 VITALS — BP 140/86 | HR 76 | Temp 98.1°F | Resp 16 | Ht 66.0 in | Wt 213.4 lb

## 2016-11-24 DIAGNOSIS — I10 Essential (primary) hypertension: Secondary | ICD-10-CM

## 2016-11-24 DIAGNOSIS — E875 Hyperkalemia: Secondary | ICD-10-CM | POA: Diagnosis not present

## 2016-11-24 LAB — BASIC METABOLIC PANEL
BUN: 20 mg/dL (ref 6–23)
CO2: 32 meq/L (ref 19–32)
Calcium: 9.7 mg/dL (ref 8.4–10.5)
Chloride: 103 meq/L (ref 96–112)
Creatinine, Ser: 0.89 mg/dL (ref 0.40–1.20)
GFR: 65.19 mL/min (ref >60.00)
Glucose, Bld: 88 mg/dL (ref 70–99)
Potassium: 6 meq/L — ABNORMAL HIGH (ref 3.5–5.1)
Sodium: 139 meq/L (ref 135–145)

## 2016-11-24 MED ORDER — AMLODIPINE BESYLATE 10 MG PO TABS: 10.0000 mg | ORAL_TABLET | Freq: Every day | ORAL | 3 refills | Status: DC

## 2016-11-24 NOTE — Progress Notes (Signed)
Pre visit review using our clinic review tool, if applicable. No additional management support is needed unless otherwise documented below in the visit note. 

## 2016-11-24 NOTE — Assessment & Plan Note (Signed)
Chronic problem.  Deteriorated today since switching from Lisinopril to Amlodipine due to hyperkalemia.  Tolerating Amlodipine w/o difficulty.  Increase to 10mg  daily.  Repeat BMP to assess hyperkalemia.  Pt expressed understanding and is in agreement w/ plan.

## 2016-11-24 NOTE — Progress Notes (Signed)
   Subjective:    Patient ID: Jenny [name], female    DOB: 1941-08-14, 76 y.o.   MRN: [id number]  HPI HTN- chronic problem.  Stopped Lisinopril at last visit due to hyperkalemia.  Switched to Amlodipine 5mg  daily.  Pt reports home BPs have been around the 140s/80-90s.  No CP, SOB, HAs, visual changes, edema.  Hyperkalemia- noted on labs done at last visit.  Based on this, we stopped the Lisinopril.  Due for repeat labs today.   Review of Systems For ROS see HPI     Objective:   Physical Exam  Constitutional: She is oriented to person, place, and time. She appears well-developed and well-nourished. No distress.  HENT:  Head: Normocephalic and atraumatic.  Eyes: Conjunctivae and EOM are normal. Pupils are equal, round, and reactive to light.  Neck: Normal range of motion. Neck supple. No thyromegaly present.  Cardiovascular: Normal rate, regular rhythm, normal heart sounds and intact distal pulses.   No murmur heard. Pulmonary/Chest: Effort normal and breath sounds normal. No respiratory distress.  Abdominal: Soft. She exhibits no distension. There is no tenderness.  Musculoskeletal: She exhibits no edema.  Lymphadenopathy:    She has no cervical adenopathy.  Neurological: She is alert and oriented to person, place, and time.  Skin: Skin is warm and dry.  Psychiatric: She has a normal mood and affect. Her behavior is normal.  Vitals reviewed.         Assessment & Plan:

## 2016-11-24 NOTE — Patient Instructions (Signed)
Follow up in 3-4 weeks to recheck BP We'll notify you of your lab results and make any changes if needed Increase the Amlodipine to 10mg  daily- 2 of what you have at home and 1 of the new prescription Continue to work on healthy diet and regular exercise to improve your BP Call with any questions or concerns Happy Valentine's Day!!!

## 2016-11-25 ENCOUNTER — Other Ambulatory Visit: Payer: Self-pay | Admitting: General Practice

## 2016-11-25 MED ORDER — HYDROCHLOROTHIAZIDE 12.5 MG PO CAPS: 12.5000 mg | ORAL_CAPSULE | Freq: Every day | ORAL | 6 refills | Status: DC

## 2016-11-27 DIAGNOSIS — G4733 Obstructive sleep apnea (adult) (pediatric): Secondary | ICD-10-CM | POA: Diagnosis not present

## 2016-12-04 ENCOUNTER — Other Ambulatory Visit: Payer: Self-pay | Admitting: Family Medicine

## 2016-12-14 ENCOUNTER — Telehealth (INDEPENDENT_AMBULATORY_CARE_PROVIDER_SITE_OTHER): Payer: Self-pay | Admitting: Orthopedic Surgery

## 2016-12-14 NOTE — Telephone Encounter (Signed)
Patient called asked if Dr Marlou Sa would call her concerning her left knee. Patient want to know if the problem she is having is her knee and it is causing her back to hurt. Patient want to talk with Dr Marlou Sa before she have to schedule surgery for her back. The number to contact her is [phone]

## 2016-12-14 NOTE — Telephone Encounter (Signed)
I called.

## 2016-12-14 NOTE — Telephone Encounter (Signed)
See note from patient

## 2016-12-17 ENCOUNTER — Telehealth: Payer: Self-pay | Admitting: Family Medicine

## 2016-12-17 ENCOUNTER — Ambulatory Visit (INDEPENDENT_AMBULATORY_CARE_PROVIDER_SITE_OTHER): Payer: Medicare Other | Admitting: Family Medicine

## 2016-12-17 ENCOUNTER — Encounter: Payer: Self-pay | Admitting: Family Medicine

## 2016-12-17 VITALS — BP 122/84 | HR 81 | Temp 98.1°F | Resp 17 | Ht 66.0 in | Wt 217.4 lb

## 2016-12-17 DIAGNOSIS — E875 Hyperkalemia: Secondary | ICD-10-CM | POA: Diagnosis not present

## 2016-12-17 DIAGNOSIS — I10 Essential (primary) hypertension: Secondary | ICD-10-CM

## 2016-12-17 DIAGNOSIS — R600 Localized edema: Secondary | ICD-10-CM | POA: Diagnosis not present

## 2016-12-17 LAB — BASIC METABOLIC PANEL
BUN: 28 mg/dL — ABNORMAL HIGH (ref 7–25)
CO2: 29 mmol/L (ref 20–31)
Calcium: 9.3 mg/dL (ref 8.6–10.4)
Chloride: 102 mmol/L (ref 98–110)
Creat: 1 mg/dL — ABNORMAL HIGH (ref 0.60–0.93)
Glucose, Bld: 119 mg/dL — ABNORMAL HIGH (ref 65–99)
Potassium: 4.9 mmol/L (ref 3.5–5.3)
Sodium: 139 mmol/L (ref 135–146)

## 2016-12-17 MED ORDER — FUROSEMIDE 20 MG PO TABS: 20.0000 mg | ORAL_TABLET | Freq: Every day | ORAL | 3 refills | Status: DC

## 2016-12-17 NOTE — Assessment & Plan Note (Signed)
Chronic problem.  Well controlled today.  Asymptomatic w/ exception of swelling.  Will decrease Amlodipine to 5mg  daily and monitor.  Pt expressed understanding and is in agreement w/ plan.

## 2016-12-17 NOTE — Patient Instructions (Signed)
Follow up as scheduled Decrease the Amlodipine to 1/2 tab (5mg ) daily Continue the HCTZ daily ADD a once daily Lasix to help w/ swelling Call with any questions or concerns Hang in there!  We'll get this figured out!!!

## 2016-12-17 NOTE — Assessment & Plan Note (Signed)
New.  Suspect this is due to her increased Amlodipine dose.  Decrease back to 5mg  daily.  Start Lasix 20mg  daily to improve sxs.  Check BMP.  Will follow.

## 2016-12-17 NOTE — Telephone Encounter (Signed)
Spoke with PCP, pt is due for BP follow up next week. Appt moved up to today to evaluate leg swelling.

## 2016-12-17 NOTE — Progress Notes (Signed)
Pre visit review using our clinic review tool, if applicable. No additional management support is needed unless otherwise documented below in the visit note. 

## 2016-12-17 NOTE — Assessment & Plan Note (Signed)
Ongoing issue.  K+ was higher at last check despite stopping ACE.  Started HCTZ at last visit.  Today, added Lasix for swelling.  Will continue to follow.

## 2016-12-17 NOTE — Progress Notes (Signed)
   Subjective:    Patient ID: NALLELI LARGENT, female    DOB: 11-16-1940, 76 y.o.   MRN: [id number]  HPI Edema- pt reports her legs were itching '1 night last week' and 'it was b/c they were stretched'.  Swelling doesn't resolve overnight.  Took a leftover Lasix 20mg  last night w/o improvement.  + tightness.   Review of Systems For ROS see HPI     Objective:   Physical Exam  Constitutional: She is oriented to person, place, and time. She appears well-developed and well-nourished. No distress.  HENT:  Head: Normocephalic and atraumatic.  Cardiovascular: Normal rate, regular rhythm and normal heart sounds.   Pulmonary/Chest: Effort normal and breath sounds normal. No respiratory distress. She has no wheezes. She has no rales.  Musculoskeletal: She exhibits edema (1+ pitting edema of bilateral LEs). She exhibits no tenderness.  Neurological: She is alert and oriented to person, place, and time.  Skin: Skin is warm and dry.  Psychiatric: She has a normal mood and affect. Her behavior is normal. Thought content normal.  Vitals reviewed.         Assessment & Plan:

## 2016-12-17 NOTE — Telephone Encounter (Signed)
Pt states that she is having swelling in legs, ankles, and feet due to the change in her BP meds and asking what should she do, please advise

## 2016-12-21 ENCOUNTER — Other Ambulatory Visit: Payer: Self-pay | Admitting: Family Medicine

## 2016-12-22 ENCOUNTER — Ambulatory Visit (INDEPENDENT_AMBULATORY_CARE_PROVIDER_SITE_OTHER): Payer: Medicare Other | Admitting: Physician Assistant

## 2016-12-22 ENCOUNTER — Encounter: Payer: Self-pay | Admitting: Physician Assistant

## 2016-12-22 VITALS — BP 114/60 | HR 91 | Temp 99.2°F | Resp 14 | Ht 66.0 in | Wt 214.0 lb

## 2016-12-22 DIAGNOSIS — B9789 Other viral agents as the cause of diseases classified elsewhere: Secondary | ICD-10-CM

## 2016-12-22 DIAGNOSIS — J069 Acute upper respiratory infection, unspecified: Secondary | ICD-10-CM | POA: Diagnosis not present

## 2016-12-22 MED ORDER — AZELASTINE HCL 0.1 % NA SOLN: 1.0000 | Freq: Two times a day (BID) | NASAL | 0 refills | Status: DC

## 2016-12-22 MED ORDER — BENZONATATE 100 MG PO CAPS: 100.0000 mg | ORAL_CAPSULE | Freq: Two times a day (BID) | ORAL | 0 refills | Status: DC | PRN

## 2016-12-22 NOTE — Patient Instructions (Signed)
Stay hydrated and get plenty of rest. Symptoms are consistent with a mild upper respiratory virus.  Continue breathing medications as directed. I have sent in a cough medication and nasal spray for you to use as directed.   Follow-up in 2 days as scheduled.

## 2016-12-22 NOTE — Progress Notes (Signed)
Pt presents to clinic c/o chills, dry mouth, sinus headache, and cough (productive of yellow mucus) x 2 days. Additionally admits nasal congestion, muscle aches, fatigue, nausea (mild), and loss of appetite. Pt has tried Mucinex and Singulair with relief. Denies sick contacts, recent travel. Denies fever, vomiting, bowel changes, urinary changes, chest pain, palpitations, runny nose, ear pain, pressure, tinnitus, dizziness.   Past Medical History:  Diagnosis Date  . ALLERGIC RHINITIS 08/23/2007  . Allergy    rhinitis  . 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 Anemia 09/21/2011  . Annual physical exam 08/06/2013  . Asthma    mild, intermittent  . Asthma 01/14/2011  . Atrophic vaginitis 06/17/2012  . BACK PAIN, LUMBAR 08/23/2007  . Bronchitis   . BRONCHITIS, RECURRENT 02/22/2008  . Cough    usually not productive  . Degenerative joint disease   . DEGENERATIVE JOINT DISEASE 02/22/2008  . Depression with anxiety 03/06/2008   Qualifier: Diagnosis of  By: Lenna Gilford MD, [place], BORDERLINE 08/21/2008  . Diverticulosis of colon   . DIVERTICULOSIS OF COLON 02/22/2008  . Dysthymia   . 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  . GERD 08/23/2007  . GERD (gastroesophageal reflux disease)   . HAND PAIN 04/09/2010  . History of chicken pox 01/14/2011  . History of measles 01/14/2011  . Hyperglycemia 07/07/2015  . Hypertension   . HYPERTENSION 02/22/2008  . Hypothyroidism   . HYPOTHYROIDISM 08/23/2007  . Incontinence of urine   . Lumbar back pain   . Obesity   . OBESITY 08/23/2007  . Obstructive sleep apnea    does not use CPAP, lost weight  . Other urinary incontinence 03/12/2010  . Peripheral neuropathy ([place]) 02/12/2011   possibly related to spondylolisthesis per pt  . PONV (postoperative nausea and vomiting)   . Sleep apnea   . SLEEP APNEA, OBSTRUCTIVE 08/23/2007  . Transient memory loss March 2006   after a stressful event she had no memory the rest of that day  . Urinary frequency   . Urinary  incontinence   . UTI (lower urinary tract infection) 03/23/2012  . Venous insufficiency   . VENOUS INSUFFICIENCY 08/23/2007  . Vitamin D deficiency   . VITAMIN D DEFICIENCY 08/21/2008    Current Outpatient Prescriptions on File Prior to Visit  Medication Sig Dispense Refill  . albuterol (PROVENTIL HFA;VENTOLIN HFA) 108 (90 BASE) MCG/ACT inhaler Inhale 2 puffs into the lungs every 6 (six) hours as needed. For wheezing 1 Inhaler 6  . ALPRAZolam (XANAX) 0.5 MG tablet Take 1 tablet (0.5 mg total) by mouth 2 (two) times daily as needed for anxiety or sleep. TAKE 1 TABLET BY MOUTH DAILY AS NEEDED FOR SLEEP/ANXIETY 60 tablet 2  . amLODipine (NORVASC) 10 MG tablet Take 1 tablet (10 mg total) by mouth daily. 30 tablet 3  . aspirin EC 81 MG tablet Take 81 mg by mouth daily.      . budesonide-formoterol (SYMBICORT) 160-4.5 MCG/ACT inhaler Inhale 2 puffs into the lungs 2 (two) times daily. (Patient taking differently: Inhale 2 puffs into the lungs daily. ) 1 Inhaler 3  . clonazePAM (KLONOPIN) 0.5 MG tablet TAKE 1/2 TO 1 TABLET BY MOUTH TWICE DAILY AS NEEDED FOR ANXIETY 60 tablet 5  . gabapentin (NEURONTIN) 300 MG capsule TAKE 1 CAPSULE(300 MG) BY MOUTH THREE TIMES DAILY 270 capsule 0  . hydrochlorothiazide (MICROZIDE) 12.5 MG capsule Take 1 capsule (12.5 mg total) by mouth daily. 30 capsule 6  . ibuprofen (ADVIL,MOTRIN) 200 MG tablet Take  400 mg by mouth every 6 (six) hours as needed for moderate pain.    Marland Kitchen levothyroxine (SYNTHROID, LEVOTHROID) 100 MCG tablet TAKE 1 TABLET BY MOUTH EVERY DAY BEFORE BREAKFAST 90 tablet 0  . montelukast (SINGULAIR) 10 MG tablet TAKE 1 TABLET(10 MG) BY MOUTH AT BEDTIME 30 tablet 0  . furosemide (LASIX) 20 MG tablet Take 1 tablet (20 mg total) by mouth daily. (Patient not taking: Reported on 12/22/2016) 30 tablet 3  . [DISCONTINUED] esomeprazole (NEXIUM) 40 MG capsule Take 40 mg by mouth daily before breakfast.       No current facility-administered medications on file prior  to visit.     Allergies  Allergen Reactions  . Prednisone Other (See Comments)    Confusion, dizziness Medrol is ok  . Codeine Nausea Only       . Tramadol Itching    Family History  Problem Relation Age of Onset  . COPD Mother   . Pneumonia Mother   . Other Mother     CHF  . Hypertension Mother   . Arthritis Mother     septic knee after replacement  . Heart disease Mother     chf  . Dementia Father   . Arthritis Brother   . Asthma Daughter   . Colon polyps Daughter   . Stroke Maternal Grandmother   . Heart disease Maternal Grandmother   . Other Maternal Grandfather     CHF  . Heart disease Maternal Grandfather     chf  . Cancer Paternal Grandmother     stomach?  . Other Paternal Grandfather     problems with kidneys  . Kidney disease Paternal Grandfather   . Anxiety disorder Daughter   . Cancer Maternal Aunt     breast  . Cancer Maternal Aunt     liver  . Colon cancer Neg Hx     Social History   Social History  . Marital status: Widowed    Spouse name: N/A  . Number of children: N/A  . Years of education: N/A   Social History Main Topics  . Smoking status: Former Smoker    Packs/day: 1.50    Years: 30.00    Types: Cigarettes    Quit date: 10/13/1991  . Smokeless tobacco: Never Used  . Alcohol use No  . Drug use: No  . Sexual activity: No   Other Topics Concern  . None   Social History Narrative  . None   Review of Systems - See HPI.  All other ROS are negative.  BP 114/60   Pulse 91   Temp 99.2 F (37.3 C) (Oral)   Resp 14   Ht 5\' 6"  (1.676 m)   Wt 214 lb (97.1 kg)   SpO2 95%   BMI 34.54 kg/m   Physical Exam  Constitutional: She is well-developed, well-nourished, and in no distress. No distress.  HENT:  Head: Normocephalic and atraumatic.  Right Ear: External ear normal. A middle ear effusion (serous) is present.  Left Ear: External ear normal. A middle ear effusion (serous) is present.  Nose: Mucosal edema present. Right sinus  exhibits no maxillary sinus tenderness and no frontal sinus tenderness. Left sinus exhibits no maxillary sinus tenderness and no frontal sinus tenderness.  Mouth/Throat: Oropharynx is clear and moist. No oropharyngeal exudate, posterior oropharyngeal edema or posterior oropharyngeal erythema.  Oropharyngeal cobblestoning seen  Eyes: Pupils are equal, round, and reactive to light. Right eye exhibits no discharge. Left eye exhibits no discharge. No scleral  icterus.  Neck: Neck supple.  Cardiovascular: Normal rate, regular rhythm and normal heart sounds.   +1 pitting edema in bilateral lower legs  Pulmonary/Chest: Effort normal. No respiratory distress. She has wheezes in the right upper field. She has no rales.  Lymphadenopathy:    She has no cervical adenopathy.  Skin: She is not diaphoretic.  Vitals reviewed.   Recent Results (from the past 2160 hour(s))  Lipid panel     Status: None   Collection Time: 11/09/16  2:11 PM  Result Value Ref Range   Cholesterol 129 0 - 200 mg/dL    Comment: ATP III Classification       Desirable:  < 200 mg/dL               Borderline High:  200 - 239 mg/dL          High:  > = 240 mg/dL   Triglycerides 66.0 0.0 - 149.0 mg/dL    Comment: Normal:  <150 mg/dLBorderline High:  150 - 199 mg/dL   HDL 44.60 >39.00 mg/dL   VLDL 13.2 0.0 - 40.0 mg/dL   LDL Cholesterol 72 0 - 99 mg/dL   Total CHOL/HDL Ratio 3     Comment:                Men          Women1/2 Average Risk     3.4          3.3Average Risk          5.0          4.42X Average Risk          9.6          7.13X Average Risk          15.0          11.0                       NonHDL 84.89     Comment: NOTE:  Non-HDL goal should be 30 mg/dL higher than patient's LDL goal (i.e. LDL goal of < 70 mg/dL, would have non-HDL goal of < 100 mg/dL)  Basic metabolic panel     Status: Abnormal   Collection Time: 11/09/16  2:11 PM  Result Value Ref Range   Sodium 142 135 - 145 mEq/L   Potassium 5.8 (H) 3.5 - 5.1 mEq/L    Chloride 104 96 - 112 mEq/L   CO2 31 19 - 32 mEq/L   Glucose, Bld 99 70 - 99 mg/dL   BUN 18 6 - 23 mg/dL   Creatinine, Ser 0.97 0.40 - 1.20 mg/dL   Calcium 10.1 8.4 - 10.5 mg/dL   GFR 59.03 (L) >60.00 mL/min  TSH     Status: None   Collection Time: 11/09/16  2:11 PM  Result Value Ref Range   TSH 0.89 0.35 - 4.50 uIU/mL  Hepatic function panel     Status: None   Collection Time: 11/09/16  2:11 PM  Result Value Ref Range   Total Bilirubin 0.5 0.2 - 1.2 mg/dL   Bilirubin, Direct 0.2 0.0 - 0.3 mg/dL   Alkaline Phosphatase 82 39 - 117 U/L   AST 13 0 - 37 U/L   ALT 10 0 - 35 U/L   Total Protein 7.0 6.0 - 8.3 g/dL   Albumin 4.1 3.5 - 5.2 g/dL  CBC with Differential/Platelet     Status: Abnormal   Collection Time:  11/09/16  2:11 PM  Result Value Ref Range   WBC 9.6 4.0 - 10.5 K/uL   RBC 4.77 3.87 - 5.11 Mil/uL   Hemoglobin 14.2 12.0 - 15.0 g/dL   HCT 42.6 36.0 - 46.0 %   MCV 89.2 78.0 - 100.0 fl   MCHC 33.3 30.0 - 36.0 g/dL   RDW 14.1 11.5 - 15.5 %   Platelets 290.0 150.0 - 400.0 K/uL   Neutrophils Relative % 60.3 43.0 - 77.0 %   Lymphocytes Relative 25.4 12.0 - 46.0 %   Monocytes Relative 7.0 3.0 - 12.0 %   Eosinophils Relative 6.4 (H) 0.0 - 5.0 %   Basophils Relative 0.9 0.0 - 3.0 %   Neutro Abs 5.8 1.4 - 7.7 K/uL   Lymphs Abs 2.4 0.7 - 4.0 K/uL   Monocytes Absolute 0.7 0.1 - 1.0 K/uL   Eosinophils Absolute 0.6 0.0 - 0.7 K/uL   Basophils Absolute 0.1 0.0 - 0.1 K/uL  Basic metabolic panel     Status: Abnormal   Collection Time: 11/12/16  9:30 AM  Result Value Ref Range   Sodium 141 135 - 145 mEq/L   Potassium 5.7 (H) 3.5 - 5.1 mEq/L   Chloride 106 96 - 112 mEq/L   CO2 32 19 - 32 mEq/L   Glucose, Bld 154 (H) 70 - 99 mg/dL   BUN 18 6 - 23 mg/dL   Creatinine, Ser 1.03 0.40 - 1.20 mg/dL   Calcium 9.7 8.4 - 10.5 mg/dL   GFR 55.08 (L) >60.00 mL/min  Basic metabolic panel     Status: Abnormal   Collection Time: 11/24/16  9:23 AM  Result Value Ref Range   Sodium 139 135 -  145 mEq/L   Potassium 6.0 (H) 3.5 - 5.1 mEq/L   Chloride 103 96 - 112 mEq/L   CO2 32 19 - 32 mEq/L   Glucose, Bld 88 70 - 99 mg/dL   BUN 20 6 - 23 mg/dL   Creatinine, Ser 0.89 0.40 - 1.20 mg/dL   Calcium 9.7 8.4 - 10.5 mg/dL   GFR 65.19 >60.00 mL/min  Basic metabolic panel     Status: Abnormal   Collection Time: 12/17/16  3:21 PM  Result Value Ref Range   Sodium 139 135 - 146 mmol/L   Potassium 4.9 3.5 - 5.3 mmol/L   Chloride 102 98 - 110 mmol/L   CO2 29 20 - 31 mmol/L   Glucose, Bld 119 (H) 65 - 99 mg/dL   BUN 28 (H) 7 - 25 mg/dL   Creat 1.00 (H) 0.60 - 0.93 mg/dL    Comment:   For patients > or = 78 years of age: The upper reference limit for Creatinine is approximately 13% higher for people identified as African-American.      Calcium 9.3 8.6 - 10.4 mg/dL   Assessment/Plan: 1. Viral URI 1.5 days of symptoms. Edema is 1+ but chronic for her. No worsening. No signs of dehydration.  Continue chronic breathing medications for mild wheeze. Start Astelin and Tessalon. Supportive measures and OTC medications reviewed. FU 2 days as scheduled with PCP.     Leeanne Rio, PA-C

## 2016-12-22 NOTE — Progress Notes (Signed)
Pre visit review using our clinic review tool, if applicable. No additional management support is needed unless otherwise documented below in the visit note. 

## 2016-12-24 ENCOUNTER — Encounter: Payer: Self-pay | Admitting: Family Medicine

## 2016-12-24 ENCOUNTER — Ambulatory Visit (INDEPENDENT_AMBULATORY_CARE_PROVIDER_SITE_OTHER): Payer: Medicare Other | Admitting: Family Medicine

## 2016-12-24 ENCOUNTER — Other Ambulatory Visit: Payer: Self-pay | Admitting: Family Medicine

## 2016-12-24 VITALS — BP 121/81 | HR 82 | Temp 98.4°F | Resp 16 | Ht 66.0 in | Wt 216.5 lb

## 2016-12-24 DIAGNOSIS — J449 Chronic obstructive pulmonary disease, unspecified: Secondary | ICD-10-CM | POA: Diagnosis not present

## 2016-12-24 DIAGNOSIS — I10 Essential (primary) hypertension: Secondary | ICD-10-CM | POA: Diagnosis not present

## 2016-12-24 MED ORDER — AMLODIPINE BESYLATE 5 MG PO TABS: 5.0000 mg | ORAL_TABLET | Freq: Every day | ORAL | 1 refills | Status: DC

## 2016-12-24 MED ORDER — IPRATROPIUM-ALBUTEROL 0.5-2.5 (3) MG/3ML IN SOLN
3.0000 mL | Freq: Once | RESPIRATORY_TRACT | Status: AC
  Administered 2016-12-24: 3 mL via RESPIRATORY_TRACT

## 2016-12-24 MED ORDER — AZITHROMYCIN 250 MG PO TABS: ORAL_TABLET | ORAL | 0 refills | Status: DC

## 2016-12-24 NOTE — Assessment & Plan Note (Signed)
Deteriorated.  Currently w/ exacerbation.  Pt's wheezing improved s/p duoneb in office.  Start Zpack.  Pt has cough meds available.  Continue inhalers.  Reviewed supportive care and red flags that should prompt return.  Pt expressed understanding and is in agreement w/ plan.

## 2016-12-24 NOTE — Progress Notes (Signed)
   Subjective:    Patient ID: Jenny [name], female    DOB: 1940-10-20, 76 y.o.   MRN: [id number]  HPI HTN- chronic problem.  Pt's BP is well controlled on HCTZ 12.5mg  and Amlodipine 5mg  (decreased at last visit due to swelling).  On Lasix 20mg  prn swelling.  No CP, HAs, visual changes, edema.  COPD- pt has had 1 week of cough and congestion.  + increased wheezing- using inhalers more than usual.  No fevers.  + SOB.  + sick contacts   Review of Systems For ROS see HPI     Objective:   Physical Exam  Constitutional: She is oriented to person, place, and time. She appears well-developed and well-nourished. No distress.  HENT:  Head: Normocephalic and atraumatic.  TMs normal bilaterally Mild nasal congestion Throat w/out erythema, edema, or exudate  Eyes: Conjunctivae and EOM are normal. Pupils are equal, round, and reactive to light.  Neck: Normal range of motion. Neck supple.  Cardiovascular: Normal rate, regular rhythm, normal heart sounds and intact distal pulses.   No murmur heard. Pulmonary/Chest: Effort normal. No respiratory distress. She has wheezes (inspiratory and expiratory wheezes diffusely- improved s/p neb tx).  + hacking cough  Musculoskeletal: She exhibits no edema.  Lymphadenopathy:    She has no cervical adenopathy.  Neurological: She is alert and oriented to person, place, and time. No cranial nerve deficit. Coordination normal.  Skin: Skin is warm and dry.  Psychiatric: She has a normal mood and affect. Her behavior is normal. Thought content normal.  Vitals reviewed.         Assessment & Plan:

## 2016-12-24 NOTE — Assessment & Plan Note (Signed)
Much improved today.  Asymptomatic.  Continue HCTZ and Amlodipine daily and Lasix prn.  No med changes at this time.  Will follow.

## 2016-12-24 NOTE — Progress Notes (Signed)
Pre visit review using our clinic review tool, if applicable. No additional management support is needed unless otherwise documented below in the visit note. 

## 2016-12-24 NOTE — Patient Instructions (Signed)
Schedule your complete physical for after 8/22 and your Medicare Wellness Visit w/ Maudie Mercury Start the Zpack as directed Continue the cough syrup that Einar Pheasant gave you Continue to use your inhalers Your blood pressure looks great!  No changes at this time (Amlodipine 5mg  and HCTZ 12.5mg  daily and Lasix as needed) Call with any questions or concerns Hang in there!!!

## 2016-12-24 NOTE — Telephone Encounter (Signed)
Last OV 12/24/16 Alprazolam last filled 09/14/16 #60 with 2

## 2016-12-25 ENCOUNTER — Other Ambulatory Visit: Payer: Self-pay | Admitting: Family Medicine

## 2016-12-25 DIAGNOSIS — E2839 Other primary ovarian failure: Secondary | ICD-10-CM

## 2017-01-02 ENCOUNTER — Ambulatory Visit (INDEPENDENT_AMBULATORY_CARE_PROVIDER_SITE_OTHER): Payer: Medicare Other | Admitting: Family Medicine

## 2017-01-02 ENCOUNTER — Encounter: Payer: Self-pay | Admitting: Family Medicine

## 2017-01-02 VITALS — BP 116/70 | HR 79 | Temp 98.1°F | Resp 18 | Wt 214.0 lb

## 2017-01-02 DIAGNOSIS — R197 Diarrhea, unspecified: Secondary | ICD-10-CM

## 2017-01-02 NOTE — Progress Notes (Signed)
Subjective:     Patient ID: Jenny [name], female   DOB: 10-01-1941, 76 y.o.   MRN: [id number]  HPI   Patient is seen with almost one week history of diarrhea. Onset last Monday of some nausea without vomiting and watery stools. No nausea since then  Her symptoms were severe at times with up to10 stools per day and and seemed to be slowing up into the week. She took a couple doses of Imodium last night and has not had any diarrhea thus far today. She states she had about 10 stools last night.   She has no abdominal pain whatsoever. No vomiting. No fever. No chills. No bloody stools. No recent travels. She was placed on Zithromax 9 days ago..  Past Medical History:  Diagnosis Date  . ALLERGIC RHINITIS 08/23/2007  . Allergy    rhinitis  . 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 Anemia 09/21/2011  . Annual physical exam 08/06/2013  . Asthma    mild, intermittent  . Asthma 01/14/2011  . Atrophic vaginitis 06/17/2012  . BACK PAIN, LUMBAR 08/23/2007  . Bronchitis   . BRONCHITIS, RECURRENT 02/22/2008  . Cough    usually not productive  . Degenerative joint disease   . DEGENERATIVE JOINT DISEASE 02/22/2008  . Depression with anxiety 03/06/2008   Qualifier: Diagnosis of  By: Lenna Gilford MD, [place], BORDERLINE 08/21/2008  . Diverticulosis of colon   . DIVERTICULOSIS OF COLON 02/22/2008  . Dysthymia   . 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  . GERD 08/23/2007  . GERD (gastroesophageal reflux disease)   . HAND PAIN 04/09/2010  . History of chicken pox 01/14/2011  . History of measles 01/14/2011  . Hyperglycemia 07/07/2015  . Hypertension   . HYPERTENSION 02/22/2008  . Hypothyroidism   . HYPOTHYROIDISM 08/23/2007  . Incontinence of urine   . Lumbar back pain   . Obesity   . OBESITY 08/23/2007  . Obstructive sleep apnea    does not use CPAP, lost weight  . Other urinary incontinence 03/12/2010  . Peripheral neuropathy ([place]) 02/12/2011   possibly related to spondylolisthesis per pt  . PONV  (postoperative nausea and vomiting)   . Sleep apnea   . SLEEP APNEA, OBSTRUCTIVE 08/23/2007  . Transient memory loss March 2006   after a stressful event she had no memory the rest of that day  . Urinary frequency   . Urinary incontinence   . UTI (lower urinary tract infection) 03/23/2012  . Venous insufficiency   . VENOUS INSUFFICIENCY 08/23/2007  . Vitamin D deficiency   . VITAMIN D DEFICIENCY 08/21/2008   Past Surgical History:  Procedure Laterality Date  . ABDOMINAL HYSTERECTOMY     took one ovary  . AP repair and sling for cystocele  8-04    Dr Gaynelle Arabian  . APPENDECTOMY    . carpal tunel  2008  . CHOLECYSTECTOMY    . ctr     bilateral  . left hand  2010   left thumb, joint removal, [facility]  . NECK SURGERY     x 2 - discectomy, fusion  . SHOULDER ARTHROSCOPY DISTAL CLAVICLE EXCISION AND OPEN ROTATOR CUFF REPAIR     right  . TONSILLECTOMY AND ADENOIDECTOMY    . TUBAL LIGATION      reports that she quit smoking about 25 years ago. Her smoking use included Cigarettes. She has a 45.00 pack-year smoking history. She has never used smokeless tobacco. She reports that she does not drink alcohol or  use drugs. family history includes Anxiety disorder in her daughter; Arthritis in her brother and mother; Asthma in her daughter; COPD in her mother; Cancer in her maternal aunt, maternal aunt, and paternal grandmother; Colon polyps in her daughter; Dementia in her father; Heart disease in her maternal grandfather, maternal grandmother, and mother; Hypertension in her mother; Kidney disease in her paternal grandfather; Other in her maternal grandfather, mother, and paternal grandfather; Pneumonia in her mother; Stroke in her maternal grandmother. Allergies  Allergen Reactions  . Prednisone Other (See Comments)    Confusion, dizziness Medrol is ok  . Codeine Nausea Only       . Tramadol Itching     Review of Systems  Constitutional: Negative for chills and fever.  Respiratory:  Negative for shortness of breath.   Cardiovascular: Negative for chest pain.  Gastrointestinal: Positive for diarrhea. Negative for abdominal pain, blood in stool, nausea and vomiting.  Neurological: Negative for dizziness and weakness.       Objective:   Physical Exam  Constitutional: She appears well-developed and well-nourished.  HENT:  Mouth/Throat: Oropharynx is clear and moist.  Cardiovascular: Normal rate.   Pulmonary/Chest: Effort normal and breath sounds normal. No respiratory distress. She has no wheezes. She has no rales.  Abdominal: Soft. She exhibits no distension. There is no tenderness.       Assessment:     Diarrhea.  Probable acute enteritis.  No associated fever, bloody stools, pain.  She does not appear to be  dehydrated.    Plan:     -discussed appropriate diet -stay well hydrated -follow up with primary early week if not improved.  Will need labs with electrolytes, consider C dif PCR if not improving.  Eulas Post MD [facility]

## 2017-01-02 NOTE — Patient Instructions (Signed)
Food Choices to Help Relieve Diarrhea, Adult When you have diarrhea, the foods you eat and your eating habits are very important. Choosing the right foods and drinks can help:  Relieve diarrhea.  Replace lost fluids and nutrients.  Prevent dehydration. What general guidelines should I follow? Relieving diarrhea   Choose foods with less than 2 g or .07 oz. of fiber per serving.  Limit fats to less than 8 tsp (38 g or 1.34 oz.) a day.  Avoid the following:  Foods and beverages sweetened with high-fructose corn syrup, honey, or sugar alcohols such as xylitol, sorbitol, and mannitol.  Foods that contain a lot of fat or sugar.  Fried, greasy, or spicy foods.  High-fiber grains, breads, and cereals.  Raw fruits and vegetables.  Eat foods that are rich in probiotics. These foods include dairy products such as yogurt and fermented milk products. They help increase healthy bacteria in the stomach and intestines (gastrointestinal tract, or GI tract).  If you have lactose intolerance, avoid dairy products. These may make your diarrhea worse.  Take medicine to help stop diarrhea (antidiarrheal medicine) only as told by your health care provider. Replacing nutrients   Eat small meals or snacks every 3-4 hours.  Eat bland foods, such as white rice, toast, or baked potato, until your diarrhea starts to get better. Gradually reintroduce nutrient-rich foods as tolerated or as told by your health care provider. This includes:  Well-cooked protein foods.  Peeled, seeded, and soft-cooked fruits and vegetables.  Low-fat dairy products.  Take vitamin and mineral supplements as told by your health care provider. Preventing dehydration    Start by sipping water or a special solution to prevent dehydration (oral rehydration solution, ORS). Urine that is clear or pale yellow means that you are getting enough fluid.  Try to drink at least 8-10 cups of fluid each day to help replace lost  fluids.  You may add other liquids in addition to water, such as clear juice or decaffeinated sports drinks, as tolerated or as told by your health care provider.  Avoid drinks with caffeine, such as coffee, tea, or soft drinks.  Avoid alcohol. What foods are recommended? The items listed may not be a complete list. Talk with your health care provider about what dietary choices are best for you. Grains  White rice. White, [place], or pita breads (fresh or toasted), including plain rolls, buns, or bagels. White pasta. Saltine, soda, or graham crackers. Pretzels. Low-fiber cereal. Cooked cereals made with water (such as cornmeal, farina, or cream cereals). Plain muffins. Matzo. Melba toast. Zwieback. Vegetables  Potatoes (without the skin). Most well-cooked and canned vegetables without skins or seeds. Tender lettuce. Fruits  Apple sauce. Fruits canned in juice. Cooked apricots, cherries, grapefruit, peaches, pears, or plums. Fresh bananas and cantaloupe. Meats and other protein foods  Baked or boiled chicken. Eggs. Tofu. Fish. Seafood. Smooth nut butters. Ground or well-cooked tender beef, ham, veal, lamb, pork, or poultry. Dairy  Plain yogurt, kefir, and unsweetened liquid yogurt. Lactose-free milk, buttermilk, skim milk, or soy milk. Low-fat or nonfat hard cheese. Beverages  Water. Low-calorie sports drinks. Fruit juices without pulp. Strained tomato and vegetable juices. Decaffeinated teas. Sugar-free beverages not sweetened with sugar alcohols. Oral rehydration solutions, if approved by your health care provider. Seasoning and other foods  Bouillon, broth, or soups made from recommended foods. What foods are not recommended? The items listed may not be a complete list. Talk with your health care provider about what dietary choices  are best for you. Grains  Whole grain, whole wheat, bran, or rye breads, rolls, pastas, and crackers. Wild or brown rice. Whole grain or bran cereals. Barley.  Oats and oatmeal. Corn tortillas or taco shells. Granola. Popcorn. Vegetables  Raw vegetables. Fried vegetables. Cabbage, broccoli, Brussels sprouts, artichokes, baked beans, beet greens, corn, kale, legumes, peas, sweet potatoes, and yams. Potato skins. Cooked spinach and cabbage. Fruits  Dried fruit, including raisins and dates. Raw fruits. Stewed or dried prunes. Canned fruits with syrup. Meat and other protein foods  Fried or fatty meats. Deli meats. Chunky nut butters. Nuts and seeds. Beans and lentils. Berniece Salines. Hot dogs. Sausage. Dairy  High-fat cheeses. Whole milk, chocolate milk, and beverages made with milk, such as milk shakes. Half-and-half. Cream. sour cream. Ice cream. Beverages  Caffeinated beverages (such as coffee, tea, soda, or energy drinks). Alcoholic beverages. Fruit juices with pulp. Prune juice. Soft drinks sweetened with high-fructose corn syrup or sugar alcohols. High-calorie sports drinks. Fats and oils  Butter. Cream sauces. Margarine. Salad oils. Plain salad dressings. Olives. Avocados. Mayonnaise. Sweets and desserts  Sweet rolls, doughnuts, and sweet breads. Sugar-free desserts sweetened with sugar alcohols such as xylitol and sorbitol. Seasoning and other foods  Honey. Hot sauce. Chili powder. Gravy. Cream-based or milk-based soups. Pancakes and waffles. Summary  When you have diarrhea, the foods you eat and your eating habits are very important.  Make sure you get at least 8-10 cups of fluid each day, or enough to keep your urine clear or pale yellow.  Eat bland foods and gradually reintroduce healthy, nutrient-rich foods as tolerated, or as told by your health care provider.  Avoid high-fiber, fried, greasy, or spicy foods. This information is not intended to replace advice given to you by your health care provider. Make sure you discuss any questions you have with your health care provider. Document Released: 12/19/2003 Document Revised: 09/25/2016 Document  Reviewed: 09/25/2016 Elsevier Interactive Patient Education  2017 [place].  Follow up with primary provider if diarrhea not improving by next week and sooner for any fever or bloody stools.

## 2017-01-02 NOTE — Progress Notes (Signed)
Pre visit review using our clinic review tool, if applicable. No additional management support is needed unless otherwise documented below in the visit note. 

## 2017-01-05 DIAGNOSIS — M5136 Other intervertebral disc degeneration, lumbar region: Secondary | ICD-10-CM | POA: Diagnosis not present

## 2017-01-05 DIAGNOSIS — M4726 Other spondylosis with radiculopathy, lumbar region: Secondary | ICD-10-CM | POA: Diagnosis not present

## 2017-01-05 DIAGNOSIS — M48062 Spinal stenosis, lumbar region with neurogenic claudication: Secondary | ICD-10-CM | POA: Diagnosis not present

## 2017-01-05 DIAGNOSIS — M4155 Other secondary scoliosis, thoracolumbar region: Secondary | ICD-10-CM | POA: Diagnosis not present

## 2017-01-05 DIAGNOSIS — M5416 Radiculopathy, lumbar region: Secondary | ICD-10-CM | POA: Diagnosis not present

## 2017-01-21 ENCOUNTER — Other Ambulatory Visit: Payer: Self-pay | Admitting: Pulmonary Disease

## 2017-01-21 ENCOUNTER — Other Ambulatory Visit: Payer: Self-pay | Admitting: Family Medicine

## 2017-01-22 NOTE — Telephone Encounter (Signed)
Last OV 12/24/16 Alprazolam last filled 12/24/16 #60 with 0

## 2017-01-22 NOTE — Telephone Encounter (Signed)
Medication filled to pharmacy as requested.   

## 2017-01-22 NOTE — Telephone Encounter (Signed)
Pt requesting refill on Clonazepam 0.5mg  tab. Last refill: 07/15/2016 #60 with 5 refills- take 0.5-1 tab bid prn anxiety. Last OV: 06/23/2016 Next OV: none scheduled  SN please advise.  Thanks!

## 2017-02-09 ENCOUNTER — Other Ambulatory Visit: Payer: Self-pay | Admitting: Family Medicine

## 2017-02-19 ENCOUNTER — Ambulatory Visit (INDEPENDENT_AMBULATORY_CARE_PROVIDER_SITE_OTHER): Payer: Medicare Other | Admitting: Physician Assistant

## 2017-02-19 ENCOUNTER — Encounter: Payer: Self-pay | Admitting: Physician Assistant

## 2017-02-19 VITALS — BP 120/74 | HR 78 | Temp 98.3°F | Resp 14 | Ht 66.0 in | Wt 222.0 lb

## 2017-02-19 DIAGNOSIS — R609 Edema, unspecified: Secondary | ICD-10-CM

## 2017-02-19 DIAGNOSIS — R0602 Shortness of breath: Secondary | ICD-10-CM

## 2017-02-19 DIAGNOSIS — R600 Localized edema: Secondary | ICD-10-CM

## 2017-02-19 DIAGNOSIS — R5383 Other fatigue: Secondary | ICD-10-CM | POA: Diagnosis not present

## 2017-02-19 LAB — BASIC METABOLIC PANEL
BUN: 15 mg/dL (ref 6–23)
CO2: 31 mEq/L (ref 19–32)
Calcium: 9.4 mg/dL (ref 8.4–10.5)
Chloride: 103 meq/L (ref 96–112)
Creatinine, Ser: 0.79 mg/dL (ref 0.40–1.20)
GFR: 74.76 mL/min (ref >60.00)
Glucose, Bld: 110 mg/dL — ABNORMAL HIGH (ref 70–99)
Potassium: 5.1 meq/L (ref 3.5–5.1)
Sodium: 141 mEq/L (ref 135–145)

## 2017-02-19 LAB — CBC WITH DIFFERENTIAL/PLATELET
Basophils Absolute: 0.1 10*3/uL (ref 0.0–0.1)
Basophils Relative: 1.7 % (ref 0.0–3.0)
Eosinophils Absolute: 0.4 10*3/uL (ref 0.0–0.7)
Eosinophils Relative: 4.2 % (ref 0.0–5.0)
HCT: 41.8 % (ref 36.0–46.0)
Hemoglobin: 13.6 g/dL (ref 12.0–15.0)
Lymphocytes Relative: 24.7 % (ref 12.0–46.0)
Lymphs Abs: 2.2 10*3/uL (ref 0.7–4.0)
MCHC: 32.5 g/dL (ref 30.0–36.0)
MCV: 92.4 fL (ref 78.0–100.0)
Monocytes Absolute: 0.8 10*3/uL (ref 0.1–1.0)
Monocytes Relative: 8.7 % (ref 3.0–12.0)
Neutro Abs: 5.4 10*3/uL (ref 1.4–7.7)
Neutrophils Relative %: 60.7 % (ref 43.0–77.0)
Platelets: 313 10*3/uL (ref 150.0–400.0)
RBC: 4.52 Mil/uL (ref 3.87–5.11)
RDW: 13.7 % (ref 11.5–15.5)
WBC: 8.9 10*3/uL (ref 4.0–10.5)

## 2017-02-19 LAB — TSH: TSH: 0.52 u[IU]/mL (ref 0.35–4.50)

## 2017-02-19 LAB — BRAIN NATRIURETIC PEPTIDE: Pro B Natriuretic peptide (BNP): 47 pg/mL (ref 0.0–100.0)

## 2017-02-19 NOTE — Patient Instructions (Signed)
You EKG looks good, unchanged from prior tracings. Please go to the lab for blood work. I will call you with your results. Increase lasix to two tablet daily over the next 3 days. Make sure to eat foods higher in potassium while doing these. Wear compression stockings.  I am scheduling you for a repeat echocardiogram.  Follow-up with Dr. Lenna Gilford as directed. Follow-up with me on Monday. If you note any worsening symptoms over the weekend or note any chest pain, racing heart, please go to the ER.

## 2017-02-19 NOTE — Progress Notes (Signed)
Pre visit review using our clinic review tool, if applicable. No additional management support is needed unless otherwise documented below in the visit note. 

## 2017-02-19 NOTE — Progress Notes (Signed)
Patient presents to clinic today c/o increase fatigue x 2 weeks. Patient endorses starting her day at normal time but feeling exhausted by noon. Is taking naps during the day, mainly afternoon. Paitent with history of OSA, endorses using CPAP nightly. Is averaging 6-7 hours of sleep at night usually. Patient also with history of hypertension and peripheral edema. Currently on Lasix 20 mg daily. Did not take yesterday. Endorses some increased swelling over past two night in legs bilaterally. Has noted episodes of much more significant swelling in past couple of months.. Is on Gabapentin without recent changes is dosage. Has tolerated well previously. Denies recent changes to diet but is not eating like she should for her health issues. Is avoiding sodium. Is not hydrating well per patient. Much more fatigued with regular activities than previously.  Endorses SOBOE. Denies chest pain, palpitations, LH or dizziness.    Sees Pulmonology -- Dr. Lenna Gilford -- overdue for follow-up.  History of hypothyroidism, anemia, OSA, Asthma.  Past Medical History:  Diagnosis Date  . ALLERGIC RHINITIS 08/23/2007  . Allergy    rhinitis  . 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 Anemia 09/21/2011  . Annual physical exam 08/06/2013  . Asthma    mild, intermittent  . Asthma 01/14/2011  . Atrophic vaginitis 06/17/2012  . BACK PAIN, LUMBAR 08/23/2007  . Bronchitis   . BRONCHITIS, RECURRENT 02/22/2008  . Cough    usually not productive  . Degenerative joint disease   . DEGENERATIVE JOINT DISEASE 02/22/2008  . Depression with anxiety 03/06/2008   Qualifier: Diagnosis of  By: Lenna Gilford MD, [place], BORDERLINE 08/21/2008  . Diverticulosis of colon   . DIVERTICULOSIS OF COLON 02/22/2008  . Dysthymia   . 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  . GERD 08/23/2007  . GERD (gastroesophageal reflux disease)   . HAND PAIN 04/09/2010  . History of chicken pox 01/14/2011  . History of measles 01/14/2011  . Hyperglycemia 07/07/2015  .  Hypertension   . HYPERTENSION 02/22/2008  . Hypothyroidism   . HYPOTHYROIDISM 08/23/2007  . Incontinence of urine   . Lumbar back pain   . Obesity   . OBESITY 08/23/2007  . Obstructive sleep apnea    does not use CPAP, lost weight  . Other urinary incontinence 03/12/2010  . Peripheral neuropathy 02/12/2011   possibly related to spondylolisthesis per pt  . PONV (postoperative nausea and vomiting)   . Sleep apnea   . SLEEP APNEA, OBSTRUCTIVE 08/23/2007  . Transient memory loss March 2006   after a stressful event she had no memory the rest of that day  . Urinary frequency   . Urinary incontinence   . UTI (lower urinary tract infection) 03/23/2012  . Venous insufficiency   . VENOUS INSUFFICIENCY 08/23/2007  . Vitamin D deficiency   . VITAMIN D DEFICIENCY 08/21/2008    Current Outpatient Prescriptions on File Prior to Visit  Medication Sig Dispense Refill  . albuterol (PROVENTIL HFA;VENTOLIN HFA) 108 (90 BASE) MCG/ACT inhaler Inhale 2 puffs into the lungs every 6 (six) hours as needed. For wheezing 1 Inhaler 6  . ALPRAZolam (XANAX) 0.5 MG tablet TAKE 1 TABLET BY MOUTH TWICE DAILY AS NEEDED FOR ANXIETY OR SLEEP 60 tablet 0  . amLODipine (NORVASC) 5 MG tablet Take 1 tablet (5 mg total) by mouth daily. 90 tablet 1  . aspirin EC 81 MG tablet Take 81 mg by mouth daily.      . budesonide-formoterol (SYMBICORT) 160-4.5 MCG/ACT inhaler Inhale 2 puffs into the  lungs 2 (two) times daily. (Patient taking differently: Inhale 2 puffs into the lungs daily. ) 1 Inhaler 3  . clonazePAM (KLONOPIN) 0.5 MG tablet TAKE 1/2 TO 1 TABLET BY MOUTH TWICE DAILY AS NEEDED FOR ANXIETY 60 tablet 5  . furosemide (LASIX) 20 MG tablet Take 1 tablet (20 mg total) by mouth daily. 30 tablet 3  . gabapentin (NEURONTIN) 300 MG capsule TAKE 1 CAPSULE(300 MG) BY MOUTH THREE TIMES DAILY 270 capsule 0  . hydrochlorothiazide (MICROZIDE) 12.5 MG capsule Take 1 capsule (12.5 mg total) by mouth daily. 30 capsule 6  . ibuprofen  (ADVIL,MOTRIN) 200 MG tablet Take 400 mg by mouth every 6 (six) hours as needed for moderate pain.    Marland Kitchen levothyroxine (SYNTHROID, LEVOTHROID) 100 MCG tablet TAKE 1 TABLET BY MOUTH EVERY DAY BEFORE BREAKFAST 90 tablet 0  . montelukast (SINGULAIR) 10 MG tablet TAKE 1 TABLET(10 MG) BY MOUTH AT BEDTIME 30 tablet 0  . [DISCONTINUED] esomeprazole (NEXIUM) 40 MG capsule Take 40 mg by mouth daily before breakfast.       No current facility-administered medications on file prior to visit.     Allergies  Allergen Reactions  . Prednisone Other (See Comments)    Confusion, dizziness Medrol is ok  . Codeine Nausea Only       . Tramadol Itching    Family History  Problem Relation Age of Onset  . COPD Mother   . Pneumonia Mother   . Other Mother        CHF  . Hypertension Mother   . Arthritis Mother        septic knee after replacement  . Heart disease Mother        chf  . Dementia Father   . Arthritis Brother   . Asthma Daughter   . Colon polyps Daughter   . Stroke Maternal Grandmother   . Heart disease Maternal Grandmother   . Other Maternal Grandfather        CHF  . Heart disease Maternal Grandfather        chf  . Cancer Paternal Grandmother        stomach?  . Other Paternal Grandfather        problems with kidneys  . Kidney disease Paternal Grandfather   . Anxiety disorder Daughter   . Cancer Maternal Aunt        breast  . Cancer Maternal Aunt        liver  . Colon cancer Neg Hx     Social History   Social History  . Marital status: Widowed    Spouse name: N/A  . Number of children: N/A  . Years of education: N/A   Social History Main Topics  . Smoking status: Former Smoker    Packs/day: 1.50    Years: 30.00    Types: Cigarettes    Quit date: 10/13/1991  . Smokeless tobacco: Never Used  . Alcohol use No  . Drug use: No  . Sexual activity: No   Other Topics Concern  . None   Social History Narrative  . None   Review of Systems - See HPI.  All other ROS  are negative.  BP 120/74   Pulse 78   Temp 98.3 F (36.8 C) (Oral)   Resp 14   Ht 5\' 6"  (1.676 m)   Wt 222 lb (100.7 kg)   SpO2 98%   BMI 35.83 kg/m   Physical Exam  Constitutional: She is oriented to person, place,  and time and well-developed, well-nourished, and in no distress.  HENT:  Head: Normocephalic and atraumatic.  Eyes: Conjunctivae are normal.  Neck: Neck supple.  Cardiovascular: Normal rate, regular rhythm, normal heart sounds and intact distal pulses.   Pulses:      Dorsalis pedis pulses are 2+ on the right side, and 2+ on the left side.       Posterior tibial pulses are 2+ on the right side, and 2+ on the left side.  1+ pitting edema of lower extremities bilaterally without erythema, induration or tenderness.  Pulmonary/Chest: Effort normal and breath sounds normal. No respiratory distress. She has no wheezes. She has no rales. She exhibits no tenderness.  Neurological: She is alert and oriented to person, place, and time.  Skin: Skin is warm and dry. No rash noted.  Psychiatric: Affect normal.  Vitals reviewed.  Recent Results (from the past 2160 hour(s))  Basic metabolic panel     Status: Abnormal   Collection Time: 11/24/16  9:23 AM  Result Value Ref Range   Sodium 139 135 - 145 mEq/L   Potassium 6.0 (H) 3.5 - 5.1 mEq/L   Chloride 103 96 - 112 mEq/L   CO2 32 19 - 32 mEq/L   Glucose, Bld 88 70 - 99 mg/dL   BUN 20 6 - 23 mg/dL   Creatinine, Ser 0.89 0.40 - 1.20 mg/dL   Calcium 9.7 8.4 - 10.5 mg/dL   GFR 65.19 >60.00 mL/min  Basic metabolic panel     Status: Abnormal   Collection Time: 12/17/16  3:21 PM  Result Value Ref Range   Sodium 139 135 - 146 mmol/L   Potassium 4.9 3.5 - 5.3 mmol/L   Chloride 102 98 - 110 mmol/L   CO2 29 20 - 31 mmol/L   Glucose, Bld 119 (H) 65 - 99 mg/dL   BUN 28 (H) 7 - 25 mg/dL   Creat 1.00 (H) 0.60 - 0.93 mg/dL    Comment:   For patients > or = 78 years of age: The upper reference limit for Creatinine is approximately  13% higher for people identified as African-American.      Calcium 9.3 8.6 - 10.4 mg/dL    Assessment/Plan: 1. Peripheral edema 2. SOBOE (shortness of breath on exertion) - B Nat Peptide - ECHOCARDIOGRAM COMPLETE; Future - EKG 12-Lead 3. Other fatigue - CBC with Differential/Platelet - Basic metabolic panel - TSH - EKG 12-Lead  Increased peripheral edema with fatigue and some SOBOE. This can be multifactorial giving multiple chronic health issues including hypothyroidism, anemia, asthma and OSA. Concern also for decreased systolic function giving symptom constellation. EKG obtained revealins NSR with left axis, unchanged from previous tracings. Lab panel today. Lungs CTAB. Furosemide increased to 40 mg daily over weekend. Daily weights. Symptom journal. Follow-up Monday for reassessment. Echocardiogram has been scheduled to assess further. Alarm signs/symptoms reviewed with patient that would prompt ER assessment. She voices understanding and agreement with plan.   Leeanne Rio, PA-C

## 2017-02-22 ENCOUNTER — Other Ambulatory Visit: Payer: Self-pay | Admitting: Physician Assistant

## 2017-02-22 ENCOUNTER — Encounter: Payer: Self-pay | Admitting: Physician Assistant

## 2017-02-22 ENCOUNTER — Ambulatory Visit (INDEPENDENT_AMBULATORY_CARE_PROVIDER_SITE_OTHER): Payer: Medicare Other | Admitting: Physician Assistant

## 2017-02-22 ENCOUNTER — Other Ambulatory Visit: Payer: Self-pay | Admitting: Family Medicine

## 2017-02-22 VITALS — BP 112/80 | HR 90 | Temp 98.0°F | Resp 14 | Ht 66.0 in | Wt 221.0 lb

## 2017-02-22 DIAGNOSIS — R609 Edema, unspecified: Secondary | ICD-10-CM

## 2017-02-22 DIAGNOSIS — R600 Localized edema: Secondary | ICD-10-CM | POA: Insufficient documentation

## 2017-02-22 LAB — BASIC METABOLIC PANEL
BUN: 24 mg/dL — ABNORMAL HIGH (ref 6–23)
CO2: 31 meq/L (ref 19–32)
Calcium: 9.5 mg/dL (ref 8.4–10.5)
Chloride: 100 meq/L (ref 96–112)
Creatinine, Ser: 1.07 mg/dL (ref 0.40–1.20)
GFR: 52.67 mL/min — ABNORMAL LOW (ref >60.00)
Glucose, Bld: 132 mg/dL — ABNORMAL HIGH (ref 70–99)
Potassium: 3.9 mEq/L (ref 3.5–5.1)
Sodium: 139 meq/L (ref 135–145)

## 2017-02-22 MED ORDER — TORSEMIDE 20 MG PO TABS: 20.0000 mg | ORAL_TABLET | Freq: Every day | ORAL | 0 refills | Status: DC

## 2017-02-22 MED ORDER — ALPRAZOLAM 0.5 MG PO TABS: ORAL_TABLET | ORAL | 0 refills | Status: DC

## 2017-02-22 NOTE — Progress Notes (Signed)
Pre visit review using our clinic review tool, if applicable. No additional management support is needed unless otherwise documented below in the visit note. 

## 2017-02-22 NOTE — Progress Notes (Signed)
Patient presents to clinic today for follow-up. Patient seen on 02/19/17 with peripheral edema and mild SOBOE. Patient has labs drawn revealing -- normal BNP, TSH and unremarkable BMP. Furosemide was increased from 20 mg daily to 40 mg daily. Order for Echocardiogram was also placed at this visit. Since that visit, patient endorses taking medications as directed. Notes only slight improvement in swelling. Has been wearing her compression stockings yesterday only. Denies any increased work of breathing, chest pain, lightheadedness, fatigue. Denies new symptoms.  Past Medical History:  Diagnosis Date  . ALLERGIC RHINITIS 08/23/2007  . Allergy    rhinitis  . 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 Anemia 09/21/2011  . Annual physical exam 08/06/2013  . Asthma    mild, intermittent  . Asthma 01/14/2011  . Atrophic vaginitis 06/17/2012  . BACK PAIN, LUMBAR 08/23/2007  . Bronchitis   . BRONCHITIS, RECURRENT 02/22/2008  . Cough    usually not productive  . Degenerative joint disease   . DEGENERATIVE JOINT DISEASE 02/22/2008  . Depression with anxiety 03/06/2008   Qualifier: Diagnosis of  By: Lenna Gilford MD, [place], BORDERLINE 08/21/2008  . Diverticulosis of colon   . DIVERTICULOSIS OF COLON 02/22/2008  . Dysthymia   . 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  . GERD 08/23/2007  . GERD (gastroesophageal reflux disease)   . HAND PAIN 04/09/2010  . History of chicken pox 01/14/2011  . History of measles 01/14/2011  . Hyperglycemia 07/07/2015  . Hypertension   . HYPERTENSION 02/22/2008  . Hypothyroidism   . HYPOTHYROIDISM 08/23/2007  . Incontinence of urine   . Lumbar back pain   . Obesity   . OBESITY 08/23/2007  . Obstructive sleep apnea    does not use CPAP, lost weight  . Other urinary incontinence 03/12/2010  . Peripheral neuropathy 02/12/2011   possibly related to spondylolisthesis per pt  . PONV (postoperative nausea and vomiting)   . Sleep apnea   . SLEEP APNEA, OBSTRUCTIVE 08/23/2007  .  Transient memory loss March 2006   after a stressful event she had no memory the rest of that day  . Urinary frequency   . Urinary incontinence   . UTI (lower urinary tract infection) 03/23/2012  . Venous insufficiency   . VENOUS INSUFFICIENCY 08/23/2007  . Vitamin D deficiency   . VITAMIN D DEFICIENCY 08/21/2008    Current Outpatient Prescriptions on File Prior to Visit  Medication Sig Dispense Refill  . albuterol (PROVENTIL HFA;VENTOLIN HFA) 108 (90 BASE) MCG/ACT inhaler Inhale 2 puffs into the lungs every 6 (six) hours as needed. For wheezing 1 Inhaler 6  . ALPRAZolam (XANAX) 0.5 MG tablet TAKE 1 TABLET BY MOUTH TWICE DAILY AS NEEDED FOR ANXIETY OR SLEEP 60 tablet 0  . amLODipine (NORVASC) 5 MG tablet Take 1 tablet (5 mg total) by mouth daily. 90 tablet 1  . aspirin EC 81 MG tablet Take 81 mg by mouth daily.      . budesonide-formoterol (SYMBICORT) 160-4.5 MCG/ACT inhaler Inhale 2 puffs into the lungs 2 (two) times daily. (Patient taking differently: Inhale 2 puffs into the lungs daily. ) 1 Inhaler 3  . clonazePAM (KLONOPIN) 0.5 MG tablet TAKE 1/2 TO 1 TABLET BY MOUTH TWICE DAILY AS NEEDED FOR ANXIETY 60 tablet 5  . furosemide (LASIX) 20 MG tablet Take 1 tablet (20 mg total) by mouth daily. 30 tablet 3  . gabapentin (NEURONTIN) 300 MG capsule TAKE 1 CAPSULE(300 MG) BY MOUTH THREE TIMES DAILY 270 capsule 0  .  hydrochlorothiazide (MICROZIDE) 12.5 MG capsule Take 1 capsule (12.5 mg total) by mouth daily. 30 capsule 6  . ibuprofen (ADVIL,MOTRIN) 200 MG tablet Take 400 mg by mouth every 6 (six) hours as needed for moderate pain.    Marland Kitchen levothyroxine (SYNTHROID, LEVOTHROID) 100 MCG tablet TAKE 1 TABLET BY MOUTH EVERY DAY BEFORE BREAKFAST 90 tablet 0  . montelukast (SINGULAIR) 10 MG tablet TAKE 1 TABLET(10 MG) BY MOUTH AT BEDTIME 30 tablet 0  . [DISCONTINUED] esomeprazole (NEXIUM) 40 MG capsule Take 40 mg by mouth daily before breakfast.       No current facility-administered medications on file  prior to visit.     Allergies  Allergen Reactions  . Prednisone Other (See Comments)    Confusion, dizziness Medrol is ok  . Codeine Nausea Only       . Tramadol Itching    Family History  Problem Relation Age of Onset  . COPD Mother   . Pneumonia Mother   . Other Mother        CHF  . Hypertension Mother   . Arthritis Mother        septic knee after replacement  . Heart disease Mother        chf  . Dementia Father   . Arthritis Brother   . Asthma Daughter   . Colon polyps Daughter   . Stroke Maternal Grandmother   . Heart disease Maternal Grandmother   . Other Maternal Grandfather        CHF  . Heart disease Maternal Grandfather        chf  . Cancer Paternal Grandmother        stomach?  . Other Paternal Grandfather        problems with kidneys  . Kidney disease Paternal Grandfather   . Anxiety disorder Daughter   . Cancer Maternal Aunt        breast  . Cancer Maternal Aunt        liver  . Colon cancer Neg Hx     Social History   Social History  . Marital status: Widowed    Spouse name: N/A  . Number of children: N/A  . Years of education: N/A   Social History Main Topics  . Smoking status: Former Smoker    Packs/day: 1.50    Years: 30.00    Types: Cigarettes    Quit date: 10/13/1991  . Smokeless tobacco: Never Used  . Alcohol use No  . Drug use: No  . Sexual activity: No   Other Topics Concern  . None   Social History Narrative  . None   Review of Systems - See HPI.  All other ROS are negative.  BP 112/80   Pulse 90   Temp 98 F (36.7 C) (Oral)   Resp 14   Ht 5\' 6"  (1.676 m)   Wt 221 lb (100.2 kg)   SpO2 98%   BMI 35.67 kg/m   Physical Exam  Constitutional: She is oriented to person, place, and time and well-developed, well-nourished, and in no distress.  HENT:  Head: Normocephalic and atraumatic.  Eyes: Conjunctivae are normal.  Neck: Neck supple.  Cardiovascular: Normal rate, regular rhythm, normal heart sounds and intact  distal pulses.   Pulses:      Dorsalis pedis pulses are 2+ on the right side, and 2+ on the left side.       Posterior tibial pulses are 2+ on the right side, and 2+ on the left side.  1+ pitting edema noted, unchanged really since last assessment a few days prior.  Pulmonary/Chest: Effort normal and breath sounds normal. No respiratory distress. She has no wheezes. She has no rales. She exhibits no tenderness.  Neurological: She is alert and oriented to person, place, and time.  Skin: Skin is warm and dry. No rash noted.  Psychiatric: Affect normal.  Vitals reviewed.   Recent Results (from the past 2160 hour(s))  Basic metabolic panel     Status: Abnormal   Collection Time: 12/17/16  3:21 PM  Result Value Ref Range   Sodium 139 135 - 146 mmol/L   Potassium 4.9 3.5 - 5.3 mmol/L   Chloride 102 98 - 110 mmol/L   CO2 29 20 - 31 mmol/L   Glucose, Bld 119 (H) 65 - 99 mg/dL   BUN 28 (H) 7 - 25 mg/dL   Creat 1.00 (H) 0.60 - 0.93 mg/dL    Comment:   For patients > or = 78 years of age: The upper reference limit for Creatinine is approximately 13% higher for people identified as African-American.      Calcium 9.3 8.6 - 10.4 mg/dL  CBC with Differential/Platelet     Status: None   Collection Time: 02/19/17 10:27 AM  Result Value Ref Range   WBC 8.9 4.0 - 10.5 K/uL   RBC 4.52 3.87 - 5.11 Mil/uL   Hemoglobin 13.6 12.0 - 15.0 g/dL   HCT 41.8 36.0 - 46.0 %   MCV 92.4 78.0 - 100.0 fl   MCHC 32.5 30.0 - 36.0 g/dL   RDW 13.7 11.5 - 15.5 %   Platelets 313.0 150.0 - 400.0 K/uL   Neutrophils Relative % 60.7 43.0 - 77.0 %   Lymphocytes Relative 24.7 12.0 - 46.0 %   Monocytes Relative 8.7 3.0 - 12.0 %   Eosinophils Relative 4.2 0.0 - 5.0 %   Basophils Relative 1.7 0.0 - 3.0 %   Neutro Abs 5.4 1.4 - 7.7 K/uL   Lymphs Abs 2.2 0.7 - 4.0 K/uL   Monocytes Absolute 0.8 0.1 - 1.0 K/uL   Eosinophils Absolute 0.4 0.0 - 0.7 K/uL   Basophils Absolute 0.1 0.0 - 0.1 K/uL  Basic metabolic panel      Status: Abnormal   Collection Time: 02/19/17 10:27 AM  Result Value Ref Range   Sodium 141 135 - 145 mEq/L   Potassium 5.1 3.5 - 5.1 mEq/L   Chloride 103 96 - 112 mEq/L   CO2 31 19 - 32 mEq/L   Glucose, Bld 110 (H) 70 - 99 mg/dL   BUN 15 6 - 23 mg/dL   Creatinine, Ser 0.79 0.40 - 1.20 mg/dL   Calcium 9.4 8.4 - 10.5 mg/dL   GFR 74.76 >60.00 mL/min  B Nat Peptide     Status: None   Collection Time: 02/19/17 10:27 AM  Result Value Ref Range   Pro B Natriuretic peptide (BNP) 47.0 0.0 - 100.0 pg/mL  TSH     Status: None   Collection Time: 02/19/17 10:27 AM  Result Value Ref Range   TSH 0.52 0.35 - 4.50 uIU/mL    Assessment/Plan: 1. Peripheral edema Concerning for mild heart failure giving the SOBOE noted although this can be related to deconditioning. Spoke with PCP. Will change Furosemide for Demadex 20 mg. Repeat labs today. Continue DASH diet and compression stockings. Echo scheduled for 03/04/17. Will have patient call to be put on cancellation list. Follow-up scheduled for this Friday. Any worsening symptoms she is to  return to office immediately or go to the ER.  - Basic metabolic panel   Leeanne Rio, PA-C

## 2017-02-22 NOTE — Patient Instructions (Signed)
Please go to the lab for blood work. I will call with results. Stop the Furosemide. Start the Torsemide once daily instead.  Continue compressions stockings.  Follow the diet below -- limiting salt intake. Stop by front desk to speak with Levada Dy about the Echo.  Follow-up in Friday. If there are any worsening symptoms in the mean time, return to the office immediately or go to the ER.    DASH Eating Plan DASH stands for "Dietary Approaches to Stop Hypertension." The DASH eating plan is a healthy eating plan that has been shown to reduce high blood pressure (hypertension). It may also reduce your risk for type 2 diabetes, heart disease, and stroke. The DASH eating plan may also help with weight loss. What are tips for following this plan? General guidelines   Avoid eating more than 2,300 mg (milligrams) of salt (sodium) a day. If you have hypertension, you may need to reduce your sodium intake to 1,500 mg a day.  Limit alcohol intake to no more than 1 drink a day for nonpregnant women and 2 drinks a day for men. One drink equals 12 oz of beer, 5 oz of wine, or 1 oz of hard liquor.  Work with your health care provider to maintain a healthy body weight or to lose weight. Ask what an ideal weight is for you.  Get at least 30 minutes of exercise that causes your heart to beat faster (aerobic exercise) most days of the week. Activities may include walking, swimming, or biking.  Work with your health care provider or diet and nutrition specialist (dietitian) to adjust your eating plan to your individual calorie needs. Reading food labels   Check food labels for the amount of sodium per serving. Choose foods with less than 5 percent of the Daily Value of sodium. Generally, foods with less than 300 mg of sodium per serving fit into this eating plan.  To find whole grains, look for the word "whole" as the first word in the ingredient list. Shopping   Buy products labeled as "low-sodium" or  "no salt added."  Buy fresh foods. Avoid canned foods and premade or frozen meals. Cooking   Avoid adding salt when cooking. Use salt-free seasonings or herbs instead of table salt or sea salt. Check with your health care provider or pharmacist before using salt substitutes.  Do not fry foods. Cook foods using healthy methods such as baking, boiling, grilling, and broiling instead.  Cook with heart-healthy oils, such as olive, canola, soybean, or sunflower oil. Meal planning    Eat a balanced diet that includes:  5 or more servings of fruits and vegetables each day. At each meal, try to fill half of your plate with fruits and vegetables.  Up to 6-8 servings of whole grains each day.  Less than 6 oz of lean meat, poultry, or fish each day. A 3-oz serving of meat is about the same size as a deck of cards. One egg equals 1 oz.  2 servings of low-fat dairy each day.  A serving of nuts, seeds, or beans 5 times each week.  Heart-healthy fats. Healthy fats called Omega-3 fatty acids are found in foods such as flaxseeds and coldwater fish, like sardines, salmon, and mackerel.  Limit how much you eat of the following:  Canned or prepackaged foods.  Food that is high in trans fat, such as fried foods.  Food that is high in saturated fat, such as fatty meat.  Sweets, desserts, sugary drinks, and  other foods with added sugar.  Full-fat dairy products.  Do not salt foods before eating.  Try to eat at least 2 vegetarian meals each week.  Eat more home-cooked food and less restaurant, buffet, and fast food.  When eating at a restaurant, ask that your food be prepared with less salt or no salt, if possible. What foods are recommended? The items listed may not be a complete list. Talk with your dietitian about what dietary choices are best for you. Grains  Whole-grain or whole-wheat bread. Whole-grain or whole-wheat pasta. Brown rice. [place]. Bulgur. Whole-grain and  low-sodium cereals. Pita bread. Low-fat, low-sodium crackers. Whole-wheat flour tortillas. Vegetables  Fresh or frozen vegetables (raw, steamed, roasted, or grilled). Low-sodium or reduced-sodium tomato and vegetable juice. Low-sodium or reduced-sodium tomato sauce and tomato paste. Low-sodium or reduced-sodium canned vegetables. Fruits  All fresh, dried, or frozen fruit. Canned fruit in natural juice (without added sugar). Meat and other protein foods  Skinless chicken or [place]. Ground chicken or [place]. Pork with fat trimmed off. Fish and seafood. Egg whites. Dried beans, peas, or lentils. Unsalted nuts, nut butters, and seeds. Unsalted canned beans. Lean cuts of beef with fat trimmed off. Low-sodium, lean deli meat. Dairy  Low-fat (1%) or fat-free (skim) milk. Fat-free, low-fat, or reduced-fat cheeses. Nonfat, low-sodium ricotta or cottage cheese. Low-fat or nonfat yogurt. Low-fat, low-sodium cheese. Fats and oils  Soft margarine without trans fats. Vegetable oil. Low-fat, reduced-fat, or light mayonnaise and salad dressings (reduced-sodium). Canola, safflower, olive, soybean, and sunflower oils. Avocado. Seasoning and other foods  Herbs. Spices. Seasoning mixes without salt. Unsalted popcorn and pretzels. Fat-free sweets. What foods are not recommended? The items listed may not be a complete list. Talk with your dietitian about what dietary choices are best for you. Grains  Baked goods made with fat, such as croissants, muffins, or some breads. Dry pasta or rice meal packs. Vegetables  Creamed or fried vegetables. Vegetables in a cheese sauce. Regular canned vegetables (not low-sodium or reduced-sodium). Regular canned tomato sauce and paste (not low-sodium or reduced-sodium). Regular tomato and vegetable juice (not low-sodium or reduced-sodium). Angie Fava. Olives. Fruits  Canned fruit in a light or heavy syrup. Fried fruit. Fruit in cream or butter sauce. Meat and other protein foods  Fatty  cuts of meat. Ribs. Fried meat. Berniece Salines. Sausage. Bologna and other processed lunch meats. Salami. Fatback. Hotdogs. Bratwurst. Salted nuts and seeds. Canned beans with added salt. Canned or smoked fish. Whole eggs or egg yolks. Chicken or [place] with skin. Dairy  Whole or 2% milk, cream, and half-and-half. Whole or full-fat cream cheese. Whole-fat or sweetened yogurt. Full-fat cheese. Nondairy creamers. Whipped toppings. Processed cheese and cheese spreads. Fats and oils  Butter. Stick margarine. Lard. Shortening. Ghee. Bacon fat. Tropical oils, such as coconut, palm kernel, or palm oil. Seasoning and other foods  Salted popcorn and pretzels. Onion salt, garlic salt, seasoned salt, table salt, and sea salt. Worcestershire sauce. Tartar sauce. Barbecue sauce. Teriyaki sauce. Soy sauce, including reduced-sodium. Steak sauce. Canned and packaged gravies. Fish sauce. Oyster sauce. Cocktail sauce. Horseradish that you find on the shelf. Ketchup. Mustard. Meat flavorings and tenderizers. Bouillon cubes. Hot sauce and Tabasco sauce. Premade or packaged marinades. Premade or packaged taco seasonings. Relishes. Regular salad dressings. Where to find more information:  National Heart, Lung, and [place]: https://wilson-eaton.com/  American Heart Association: www.heart.org Summary  The DASH eating plan is a healthy eating plan that has been shown to reduce high blood pressure (hypertension). It  may also reduce your risk for type 2 diabetes, heart disease, and stroke.  With the DASH eating plan, you should limit salt (sodium) intake to 2,300 mg a day. If you have hypertension, you may need to reduce your sodium intake to 1,500 mg a day.  When on the DASH eating plan, aim to eat more fresh fruits and vegetables, whole grains, lean proteins, low-fat dairy, and heart-healthy fats.  Work with your health care provider or diet and nutrition specialist (dietitian) to adjust your eating plan to your individual  calorie needs. This information is not intended to replace advice given to you by your health care provider. Make sure you discuss any questions you have with your health care provider. Document Released: 09/17/2011 Document Revised: 09/21/2016 Document Reviewed: 09/21/2016 Elsevier Interactive Patient Education  2017 [employer].

## 2017-02-26 ENCOUNTER — Ambulatory Visit (INDEPENDENT_AMBULATORY_CARE_PROVIDER_SITE_OTHER): Payer: Medicare Other | Admitting: Physician Assistant

## 2017-02-26 ENCOUNTER — Encounter: Payer: Self-pay | Admitting: Physician Assistant

## 2017-02-26 VITALS — BP 122/80 | HR 80 | Temp 97.7°F | Resp 14 | Ht 66.0 in | Wt 221.0 lb

## 2017-02-26 DIAGNOSIS — R600 Localized edema: Secondary | ICD-10-CM

## 2017-02-26 DIAGNOSIS — R609 Edema, unspecified: Secondary | ICD-10-CM | POA: Diagnosis not present

## 2017-02-26 MED ORDER — TORSEMIDE 20 MG PO TABS: 40.0000 mg | ORAL_TABLET | Freq: Every day | ORAL | 0 refills | Status: DC

## 2017-02-26 MED ORDER — GABAPENTIN 300 MG PO CAPS: 300.0000 mg | ORAL_CAPSULE | Freq: Three times a day (TID) | ORAL | 0 refills | Status: DC

## 2017-02-26 NOTE — Patient Instructions (Signed)
Please go to the lab for repeat BMP. I will call with results. Increase the Torsemide to two tablets (40 mg) daily.  Continue compression stockings.   Call me Monday to let me know how swelling and weight are. Have the echocardiogram as scheduled.  If there is any worsening edema, weight gain or shortness of breath with this regimen over the weekend, please be seen in the ER.

## 2017-02-26 NOTE — Progress Notes (Signed)
Pre visit review using our clinic review tool, if applicable. No additional management support is needed unless otherwise documented below in the visit note. 

## 2017-02-26 NOTE — Progress Notes (Signed)
Patient presents to clinic today for follow-up in peripheral edema. At last visit, furosemide was switched to Torsemide 20 mg daily. Patient was instructed to follow low-salt diet and wear compression stockings. Also encouraged to check daily weights. Patient endorses taking medication and following instructions as directed. Denies any worsening symptoms. No change in weight. Only slight improvement in edema. Is scheduled for echocardiogram on 03/10/17. Was initially scheduled for this coming week but she canceled. Denies any worsening SOB.  Past Medical History:  Diagnosis Date  . ALLERGIC RHINITIS 08/23/2007  . Allergy    rhinitis  . 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 Anemia 09/21/2011  . Annual physical exam 08/06/2013  . Asthma    mild, intermittent  . Asthma 01/14/2011  . Atrophic vaginitis 06/17/2012  . BACK PAIN, LUMBAR 08/23/2007  . Bronchitis   . BRONCHITIS, RECURRENT 02/22/2008  . Cough    usually not productive  . Degenerative joint disease   . DEGENERATIVE JOINT DISEASE 02/22/2008  . Depression with anxiety 03/06/2008   Qualifier: Diagnosis of  By: Lenna Gilford MD, [place], BORDERLINE 08/21/2008  . Diverticulosis of colon   . DIVERTICULOSIS OF COLON 02/22/2008  . Dysthymia   . 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  . GERD 08/23/2007  . GERD (gastroesophageal reflux disease)   . HAND PAIN 04/09/2010  . History of chicken pox 01/14/2011  . History of measles 01/14/2011  . Hyperglycemia 07/07/2015  . Hypertension   . HYPERTENSION 02/22/2008  . Hypothyroidism   . HYPOTHYROIDISM 08/23/2007  . Incontinence of urine   . Lumbar back pain   . Obesity   . OBESITY 08/23/2007  . Obstructive sleep apnea    does not use CPAP, lost weight  . Other urinary incontinence 03/12/2010  . Peripheral neuropathy 02/12/2011   possibly related to spondylolisthesis per pt  . PONV (postoperative nausea and vomiting)   . Sleep apnea   . SLEEP APNEA, OBSTRUCTIVE 08/23/2007  . Transient memory loss  March 2006   after a stressful event she had no memory the rest of that day  . Urinary frequency   . Urinary incontinence   . UTI (lower urinary tract infection) 03/23/2012  . Venous insufficiency   . VENOUS INSUFFICIENCY 08/23/2007  . Vitamin D deficiency   . VITAMIN D DEFICIENCY 08/21/2008    Current Outpatient Prescriptions on File Prior to Visit  Medication Sig Dispense Refill  . albuterol (PROVENTIL HFA;VENTOLIN HFA) 108 (90 BASE) MCG/ACT inhaler Inhale 2 puffs into the lungs every 6 (six) hours as needed. For wheezing 1 Inhaler 6  . ALPRAZolam (XANAX) 0.5 MG tablet TAKE 1 TABLET BY MOUTH TWICE DAILY AS NEEDED FOR ANXIETY OR SLEEP 60 tablet 0  . amLODipine (NORVASC) 5 MG tablet Take 1 tablet (5 mg total) by mouth daily. 90 tablet 1  . aspirin EC 81 MG tablet Take 81 mg by mouth daily.      . budesonide-formoterol (SYMBICORT) 160-4.5 MCG/ACT inhaler Inhale 2 puffs into the lungs 2 (two) times daily. (Patient taking differently: Inhale 2 puffs into the lungs daily. ) 1 Inhaler 3  . clonazePAM (KLONOPIN) 0.5 MG tablet TAKE 1/2 TO 1 TABLET BY MOUTH TWICE DAILY AS NEEDED FOR ANXIETY 60 tablet 5  . gabapentin (NEURONTIN) 300 MG capsule TAKE 1 CAPSULE(300 MG) BY MOUTH THREE TIMES DAILY 270 capsule 0  . hydrochlorothiazide (MICROZIDE) 12.5 MG capsule Take 1 capsule (12.5 mg total) by mouth daily. 30 capsule 6  . ibuprofen (ADVIL,MOTRIN) 200 MG tablet  Take 400 mg by mouth every 6 (six) hours as needed for moderate pain.    Marland Kitchen levothyroxine (SYNTHROID, LEVOTHROID) 100 MCG tablet TAKE 1 TABLET BY MOUTH EVERY DAY BEFORE BREAKFAST 90 tablet 0  . montelukast (SINGULAIR) 10 MG tablet TAKE 1 TABLET(10 MG) BY MOUTH AT BEDTIME 30 tablet 0  . torsemide (DEMADEX) 20 MG tablet Take 1 tablet (20 mg total) by mouth daily. 15 tablet 0  . [DISCONTINUED] esomeprazole (NEXIUM) 40 MG capsule Take 40 mg by mouth daily before breakfast.       No current facility-administered medications on file prior to visit.      Allergies  Allergen Reactions  . Prednisone Other (See Comments)    Confusion, dizziness Medrol is ok  . Codeine Nausea Only       . Tramadol Itching    Family History  Problem Relation Age of Onset  . COPD Mother   . Pneumonia Mother   . Other Mother        CHF  . Hypertension Mother   . Arthritis Mother        septic knee after replacement  . Heart disease Mother        chf  . Dementia Father   . Arthritis Brother   . Asthma Daughter   . Colon polyps Daughter   . Stroke Maternal Grandmother   . Heart disease Maternal Grandmother   . Other Maternal Grandfather        CHF  . Heart disease Maternal Grandfather        chf  . Cancer Paternal Grandmother        stomach?  . Other Paternal Grandfather        problems with kidneys  . Kidney disease Paternal Grandfather   . Anxiety disorder Daughter   . Cancer Maternal Aunt        breast  . Cancer Maternal Aunt        liver  . Colon cancer Neg Hx     Social History   Social History  . Marital status: Widowed    Spouse name: N/A  . Number of children: N/A  . Years of education: N/A   Social History Main Topics  . Smoking status: Former Smoker    Packs/day: 1.50    Years: 30.00    Types: Cigarettes    Quit date: 10/13/1991  . Smokeless tobacco: Never Used  . Alcohol use No  . Drug use: No  . Sexual activity: No   Other Topics Concern  . None   Social History Narrative  . None   Review of Systems - See HPI.  All other ROS are negative.  BP 122/80   Pulse 80   Temp 97.7 F (36.5 C) (Oral)   Resp 14   Ht 5\' 6"  (1.676 m)   Wt 221 lb (100.2 kg)   SpO2 98%   BMI 35.67 kg/m   Physical Exam  Constitutional: She is oriented to person, place, and time and well-developed, well-nourished, and in no distress.  HENT:  Head: Normocephalic and atraumatic.  Eyes: Conjunctivae are normal.  Neck: Neck supple.  Cardiovascular: Normal rate, regular rhythm, normal heart sounds and intact distal pulses.    Pulses:      Dorsalis pedis pulses are 2+ on the right side, and 2+ on the left side.       Posterior tibial pulses are 2+ on the right side, and 2+ on the left side.  1+ pitting edema, slightly  improved from last examination  Pulmonary/Chest: Effort normal and breath sounds normal. No respiratory distress. She has no wheezes. She has no rales. She exhibits no tenderness.  Neurological: She is alert and oriented to person, place, and time.  Skin: Skin is warm and dry. No rash noted.  Psychiatric: Affect normal.  Vitals reviewed.   Recent Results (from the past 2160 hour(s))  Basic metabolic panel     Status: Abnormal   Collection Time: 12/17/16  3:21 PM  Result Value Ref Range   Sodium 139 135 - 146 mmol/L   Potassium 4.9 3.5 - 5.3 mmol/L   Chloride 102 98 - 110 mmol/L   CO2 29 20 - 31 mmol/L   Glucose, Bld 119 (H) 65 - 99 mg/dL   BUN 28 (H) 7 - 25 mg/dL   Creat 1.00 (H) 0.60 - 0.93 mg/dL    Comment:   For patients > or = 78 years of age: The upper reference limit for Creatinine is approximately 13% higher for people identified as African-American.      Calcium 9.3 8.6 - 10.4 mg/dL  CBC with Differential/Platelet     Status: None   Collection Time: 02/19/17 10:27 AM  Result Value Ref Range   WBC 8.9 4.0 - 10.5 K/uL   RBC 4.52 3.87 - 5.11 Mil/uL   Hemoglobin 13.6 12.0 - 15.0 g/dL   HCT 41.8 36.0 - 46.0 %   MCV 92.4 78.0 - 100.0 fl   MCHC 32.5 30.0 - 36.0 g/dL   RDW 13.7 11.5 - 15.5 %   Platelets 313.0 150.0 - 400.0 K/uL   Neutrophils Relative % 60.7 43.0 - 77.0 %   Lymphocytes Relative 24.7 12.0 - 46.0 %   Monocytes Relative 8.7 3.0 - 12.0 %   Eosinophils Relative 4.2 0.0 - 5.0 %   Basophils Relative 1.7 0.0 - 3.0 %   Neutro Abs 5.4 1.4 - 7.7 K/uL   Lymphs Abs 2.2 0.7 - 4.0 K/uL   Monocytes Absolute 0.8 0.1 - 1.0 K/uL   Eosinophils Absolute 0.4 0.0 - 0.7 K/uL   Basophils Absolute 0.1 0.0 - 0.1 K/uL  Basic metabolic panel     Status: Abnormal   Collection Time:  02/19/17 10:27 AM  Result Value Ref Range   Sodium 141 135 - 145 mEq/L   Potassium 5.1 3.5 - 5.1 mEq/L   Chloride 103 96 - 112 mEq/L   CO2 31 19 - 32 mEq/L   Glucose, Bld 110 (H) 70 - 99 mg/dL   BUN 15 6 - 23 mg/dL   Creatinine, Ser 0.79 0.40 - 1.20 mg/dL   Calcium 9.4 8.4 - 10.5 mg/dL   GFR 74.76 >60.00 mL/min  B Nat Peptide     Status: None   Collection Time: 02/19/17 10:27 AM  Result Value Ref Range   Pro B Natriuretic peptide (BNP) 47.0 0.0 - 100.0 pg/mL  TSH     Status: None   Collection Time: 02/19/17 10:27 AM  Result Value Ref Range   TSH 0.52 0.35 - 4.50 uIU/mL  Basic metabolic panel     Status: Abnormal   Collection Time: 02/22/17 12:13 PM  Result Value Ref Range   Sodium 139 135 - 145 mEq/L   Potassium 3.9 3.5 - 5.1 mEq/L   Chloride 100 96 - 112 mEq/L   CO2 31 19 - 32 mEq/L   Glucose, Bld 132 (H) 70 - 99 mg/dL   BUN 24 (H) 6 - 23 mg/dL   Creatinine,  Ser 1.07 0.40 - 1.20 mg/dL   Calcium 9.5 8.4 - 10.5 mg/dL   GFR 52.67 (L) >60.00 mL/min    Assessment/Plan: 1. Peripheral edema Lungs CTAB. Vitals stable. BMP today. Increase Torsemide to 40 mg daily. Echo scheduled. Will follow-up with patient on Monday.   - Basic metabolic panel   Leeanne Rio, PA-C

## 2017-02-28 ENCOUNTER — Other Ambulatory Visit: Payer: Self-pay | Admitting: Physician Assistant

## 2017-03-01 ENCOUNTER — Telehealth: Payer: Self-pay | Admitting: Family Medicine

## 2017-03-01 NOTE — Telephone Encounter (Signed)
Spoke with patient and she is taking Toresemide 40 mg. She has increased her fluid intake and is going to the bathroom more.

## 2017-03-01 NOTE — Telephone Encounter (Signed)
Please call patient and verify that it is the Torsemide that she is taking, as we stopped the Furosemide.

## 2017-03-01 NOTE — Telephone Encounter (Signed)
Patient states she was told to call to provide an update to [facility] regarding her fluid buildup.  She states her weight was the same this morning.  She has been taking the Furosamide for the past couple days.  She will call back later in the week to provide another update at that time.

## 2017-03-04 ENCOUNTER — Telehealth: Payer: Self-pay | Admitting: Family Medicine

## 2017-03-04 ENCOUNTER — Other Ambulatory Visit (HOSPITAL_COMMUNITY): Payer: Medicare Other

## 2017-03-04 MED ORDER — FLUCONAZOLE 150 MG PO TABS: 150.0000 mg | ORAL_TABLET | Freq: Once | ORAL | 0 refills | Status: AC

## 2017-03-04 NOTE — Telephone Encounter (Signed)
Pt asking if [facility] would call in diflucan for her, pt states that she is not home right now she is with her daughter who just had surgery and her granddaughter could pick this up for her, [facility] on [address], pt asked if this could please be done and pt called before 11:00am

## 2017-03-04 NOTE — Telephone Encounter (Signed)
Spoke with pt, she is having some vaginal itching and discharge. Per PCP it is ok to send in diflucan. Pt made aware that if no better she would need an appt. Pt agreed.

## 2017-03-10 ENCOUNTER — Ambulatory Visit (HOSPITAL_COMMUNITY): Payer: Medicare Other | Attending: Cardiovascular Disease

## 2017-03-15 ENCOUNTER — Other Ambulatory Visit (INDEPENDENT_AMBULATORY_CARE_PROVIDER_SITE_OTHER): Payer: Medicare Other

## 2017-03-15 ENCOUNTER — Ambulatory Visit (INDEPENDENT_AMBULATORY_CARE_PROVIDER_SITE_OTHER): Payer: Medicare Other | Admitting: Pulmonary Disease

## 2017-03-15 ENCOUNTER — Encounter: Payer: Self-pay | Admitting: Pulmonary Disease

## 2017-03-15 ENCOUNTER — Ambulatory Visit (INDEPENDENT_AMBULATORY_CARE_PROVIDER_SITE_OTHER)
Admission: RE | Admit: 2017-03-15 | Discharge: 2017-03-15 | Disposition: A | Discharge status: Home / Self Care | Payer: Medicare Other | Source: Ambulatory Visit | Attending: Pulmonary Disease | Admitting: Pulmonary Disease

## 2017-03-15 ENCOUNTER — Other Ambulatory Visit: Payer: Self-pay | Admitting: Family Medicine

## 2017-03-15 VITALS — BP 136/78 | HR 89 | Temp 96.9°F | Ht 66.0 in | Wt 215.2 lb

## 2017-03-15 DIAGNOSIS — I872 Venous insufficiency (chronic) (peripheral): Secondary | ICD-10-CM

## 2017-03-15 DIAGNOSIS — J410 Simple chronic bronchitis: Secondary | ICD-10-CM

## 2017-03-15 DIAGNOSIS — G4733 Obstructive sleep apnea (adult) (pediatric): Secondary | ICD-10-CM | POA: Diagnosis not present

## 2017-03-15 DIAGNOSIS — M545 Low back pain: Secondary | ICD-10-CM | POA: Diagnosis not present

## 2017-03-15 DIAGNOSIS — E66812 Obesity, class 2: Secondary | ICD-10-CM

## 2017-03-15 DIAGNOSIS — I10 Essential (primary) hypertension: Secondary | ICD-10-CM | POA: Diagnosis not present

## 2017-03-15 DIAGNOSIS — J4521 Mild intermittent asthma with (acute) exacerbation: Secondary | ICD-10-CM | POA: Diagnosis not present

## 2017-03-15 DIAGNOSIS — G8929 Other chronic pain: Secondary | ICD-10-CM

## 2017-03-15 DIAGNOSIS — Z6836 Body mass index (BMI) 36.0-36.9, adult: Secondary | ICD-10-CM

## 2017-03-15 LAB — SEDIMENTATION RATE: Sed Rate: 22 mm/h (ref 0–30)

## 2017-03-15 LAB — BASIC METABOLIC PANEL
BUN: 23 mg/dL (ref 6–23)
CO2: 35 mEq/L — ABNORMAL HIGH (ref 19–32)
Calcium: 10 mg/dL (ref 8.4–10.5)
Chloride: 99 meq/L (ref 96–112)
Creatinine, Ser: 1.12 mg/dL (ref 0.40–1.20)
GFR: 49.96 mL/min — ABNORMAL LOW (ref >60.00)
Glucose, Bld: 125 mg/dL — ABNORMAL HIGH (ref 70–99)
Potassium: 5.1 mEq/L (ref 3.5–5.1)
Sodium: 141 mEq/L (ref 135–145)

## 2017-03-15 LAB — BRAIN NATRIURETIC PEPTIDE: Pro B Natriuretic peptide (BNP): 35 pg/mL (ref 0.0–100.0)

## 2017-03-15 MED ORDER — BUDESONIDE-FORMOTEROL FUMARATE 160-4.5 MCG/ACT IN AERO: 2.0000 | INHALATION_SPRAY | Freq: Two times a day (BID) | RESPIRATORY_TRACT | 0 refills | Status: DC

## 2017-03-15 MED ORDER — METHYLPREDNISOLONE 8 MG PO TABS: ORAL_TABLET | ORAL | 0 refills | Status: DC

## 2017-03-15 MED ORDER — METHYLPREDNISOLONE ACETATE 80 MG/ML IJ SUSP
80.0000 mg | Freq: Once | INTRAMUSCULAR | Status: AC
  Administered 2017-03-15: 80 mg via INTRAMUSCULAR

## 2017-03-15 NOTE — Patient Instructions (Signed)
Today we updated your med list in our EPIC system...    Continue your current medications the same...  Today we checked a follow up CXR & your blood work to check for fluid & inflammation...    We will contact you w/ the results when available...   We gave you a Depo shot for the airway inflammation... Start the MEDROL 8mg  tabs as follows>    Take one tab twice daily for 5 days...    Then decrease to one tab each AM for 5 days...    Then decrease to 1/2 tab daily in the AM for 5 days...    Then decrease to 1/2 tab every other day til gone (1/2, 0, 1/2, o, etc)...  Take your DUONEB via NEBULIZER 4 times daily...    Continue the Symbicort160-2sp twice daily  Add-in the OTC MUCINEX 600mg  tabs- one tab 4 times daily followed by extra water...    Remember NO SALT    Continue the [facility] but stop the HCTZ...  Call for any questions...  Let's plan a follow up visit in 3-4weeks, sooner if needed for problems.Marland KitchenMarland Kitchen
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
## 2017-03-16 ENCOUNTER — Ambulatory Visit: Payer: Medicare Other | Admitting: Pulmonary Disease

## 2017-03-16 ENCOUNTER — Other Ambulatory Visit: Payer: Self-pay

## 2017-03-16 ENCOUNTER — Ambulatory Visit (HOSPITAL_COMMUNITY): Payer: Medicare Other | Attending: Cardiovascular Disease

## 2017-03-16 DIAGNOSIS — E669 Obesity, unspecified: Secondary | ICD-10-CM | POA: Diagnosis not present

## 2017-03-16 DIAGNOSIS — I348 Other nonrheumatic mitral valve disorders: Secondary | ICD-10-CM | POA: Diagnosis not present

## 2017-03-16 DIAGNOSIS — I10 Essential (primary) hypertension: Secondary | ICD-10-CM | POA: Insufficient documentation

## 2017-03-16 DIAGNOSIS — R0602 Shortness of breath: Secondary | ICD-10-CM | POA: Diagnosis not present

## 2017-03-16 DIAGNOSIS — Z87891 Personal history of nicotine dependence: Secondary | ICD-10-CM | POA: Insufficient documentation

## 2017-03-16 DIAGNOSIS — Z6834 Body mass index (BMI) 34.0-34.9, adult: Secondary | ICD-10-CM | POA: Diagnosis not present

## 2017-03-17 ENCOUNTER — Ambulatory Visit: Payer: Medicare Other | Admitting: Pulmonary Disease

## 2017-03-29 ENCOUNTER — Other Ambulatory Visit: Payer: Self-pay | Admitting: General Practice

## 2017-03-29 ENCOUNTER — Telehealth: Payer: Self-pay | Admitting: Family Medicine

## 2017-03-29 NOTE — Telephone Encounter (Signed)
Forwarding to PCP. I filled once in PCP absence.   Dr. [name], seems patient is getting benzo from both our office and Dr. Lenna Gilford. Not sure why he is prescribing klonopin for the same reason she already takes her alprazolam

## 2017-03-29 NOTE — Telephone Encounter (Signed)
Please remove Clonazepam from chart.  Will fill pt's alprazolam when it is time for a refill

## 2017-03-29 NOTE — Telephone Encounter (Signed)
Pt spoke with Dr. Leeanne Deed and he is taking away the clonazepam. ;t was going to inform you just in case you might have to increase her alprazolam.

## 2017-03-29 NOTE — Telephone Encounter (Signed)
Pharmacist is calling to see if you knew that pt was getting clonazepam from Dr Lenna Gilford and you have a rx in for alprazolam. Pharmacist wanted to make sure you knew that before she filled the rx. Please call with further instructions.

## 2017-03-29 NOTE — Telephone Encounter (Signed)
Noted, med removed.

## 2017-03-29 NOTE — Telephone Encounter (Signed)
Pt should not be getting Clonazepam from Dr Leeanne Deed and Alprazolam from me.  Please call her to clarify this situation.

## 2017-03-30 ENCOUNTER — Other Ambulatory Visit: Payer: Self-pay | Admitting: Physician Assistant

## 2017-03-31 NOTE — Telephone Encounter (Signed)
Is this a medication patient needs to continue for long term use.

## 2017-03-31 NOTE — Telephone Encounter (Signed)
Spoke with pharmacist and the clonazepam will be removed from her list.

## 2017-03-31 NOTE — Telephone Encounter (Signed)
Lilia from [place] called and needs clarification on the medication prescribed to the patient. Requesting a call back . [phone]. Thank you

## 2017-04-06 ENCOUNTER — Ambulatory Visit: Payer: Medicare Other | Admitting: Pulmonary Disease

## 2017-04-13 ENCOUNTER — Ambulatory Visit (INDEPENDENT_AMBULATORY_CARE_PROVIDER_SITE_OTHER): Payer: Medicare Other | Admitting: Podiatry

## 2017-04-13 ENCOUNTER — Encounter: Payer: Self-pay | Admitting: Podiatry

## 2017-04-13 VITALS — BP 102/66 | HR 85 | Resp 16 | Ht 65.0 in | Wt 215.0 lb

## 2017-04-13 DIAGNOSIS — L600 Ingrowing nail: Secondary | ICD-10-CM | POA: Diagnosis not present

## 2017-04-13 MED ORDER — NEOMYCIN-POLYMYXIN-HC 1 % OT SOLN: OTIC | 1 refills | Status: DC

## 2017-04-13 NOTE — Patient Instructions (Signed)

## 2017-04-13 NOTE — Progress Notes (Signed)
   Subjective:    Patient ID: Jenny [name], female    DOB: 1941-08-31, 76 y.o.   MRN: [id number]  HPI: She presents today concerned about her left foot great toenail states that the nail is discolored thickened and is painful. She stated the nails been bleeding for about 3 days on and off and the bottom side of the nail feel sore and has been doing this for the past couple of months.    Review of Systems  HENT: Positive for hearing loss.   Eyes: Positive for visual disturbance.  Respiratory: Positive for wheezing.   Genitourinary: Positive for urgency.  Musculoskeletal: Positive for back pain and gait problem.  Hematological: Bruises/bleeds easily.  All other systems reviewed and are negative.      Objective:   Physical Exam: Vital signs are stable she is alert and oriented 3 pulses are palpable. Neurologic sensorium is intact mild edema about the ankle. Sharp radial nail margin along the tibial border hallux left is was let painful on palpation. No purulence or malodor.        Assessment & Plan:  Ingrown nail paronychia Says hallux left.  Plan: Chemical matrixectomy is performed today after local anesthesia was administered she tolerated procedure well without complications. He was provided with both oral and written home-going instructions for care and soaking of her toe as well as a prescription for Cortisporin Otic to be applied twice daily after soaking and covered with a Band-Aid. I will follow up with her in 1 week.

## 2017-04-25 ENCOUNTER — Other Ambulatory Visit: Payer: Self-pay | Admitting: Physician Assistant

## 2017-04-26 NOTE — Telephone Encounter (Signed)
Medication filled to pharmacy as requested.   

## 2017-04-26 NOTE — Telephone Encounter (Signed)
Last OV 02/26/17 Alprazolam last filled 02/22/17 #60 with 0

## 2017-04-28 ENCOUNTER — Ambulatory Visit: Payer: Medicare Other | Admitting: Family Medicine

## 2017-05-06 ENCOUNTER — Encounter: Payer: Self-pay | Admitting: Podiatry

## 2017-05-06 ENCOUNTER — Ambulatory Visit (INDEPENDENT_AMBULATORY_CARE_PROVIDER_SITE_OTHER): Payer: Self-pay | Admitting: Podiatry

## 2017-05-06 DIAGNOSIS — L600 Ingrowing nail: Secondary | ICD-10-CM

## 2017-05-06 NOTE — Patient Instructions (Signed)

## 2017-05-07 NOTE — Progress Notes (Signed)
She presents today for follow-up of her nail procedure. She states is doing very well she's happy with the outcome. She states that she continues to soak on a regular basis and apply for drops.  Objective: Vital signs are stable she's alert and oriented 3 hallux left appears to be healing very nicely there is no erythema cellulitis drainage or odor nail bed is nearly completely keratinized at this point.  Assessment: Well-healing surgical foot left.  Plan: I recommended she continue to soak at least every other day in Epsom salts and warm water covering the daytime and leave open at bedtime. Notify me with any questions or concerns or any regrowth.

## 2017-05-17 ENCOUNTER — Ambulatory Visit: Payer: Medicare Other | Admitting: Pulmonary Disease

## 2017-05-26 ENCOUNTER — Other Ambulatory Visit: Payer: Self-pay | Admitting: Physician Assistant

## 2017-05-26 ENCOUNTER — Ambulatory Visit (INDEPENDENT_AMBULATORY_CARE_PROVIDER_SITE_OTHER): Payer: Medicare Other | Admitting: Pulmonary Disease

## 2017-05-26 ENCOUNTER — Encounter: Payer: Self-pay | Admitting: Pulmonary Disease

## 2017-05-26 VITALS — BP 128/80 | HR 73 | Temp 97.7°F | Ht 66.0 in | Wt 220.2 lb

## 2017-05-26 DIAGNOSIS — Z6835 Body mass index (BMI) 35.0-35.9, adult: Secondary | ICD-10-CM

## 2017-05-26 DIAGNOSIS — I872 Venous insufficiency (chronic) (peripheral): Secondary | ICD-10-CM | POA: Diagnosis not present

## 2017-05-26 DIAGNOSIS — R600 Localized edema: Secondary | ICD-10-CM

## 2017-05-26 DIAGNOSIS — I10 Essential (primary) hypertension: Secondary | ICD-10-CM | POA: Diagnosis not present

## 2017-05-26 DIAGNOSIS — J4520 Mild intermittent asthma, uncomplicated: Secondary | ICD-10-CM | POA: Diagnosis not present

## 2017-05-26 DIAGNOSIS — G8929 Other chronic pain: Secondary | ICD-10-CM | POA: Diagnosis not present

## 2017-05-26 DIAGNOSIS — G4733 Obstructive sleep apnea (adult) (pediatric): Secondary | ICD-10-CM | POA: Diagnosis not present

## 2017-05-26 DIAGNOSIS — M545 Low back pain, unspecified: Secondary | ICD-10-CM

## 2017-05-26 DIAGNOSIS — E66812 Obesity, class 2: Secondary | ICD-10-CM

## 2017-05-26 MED ORDER — BUDESONIDE 0.25 MG/2ML IN SUSP: 0.2500 mg | Freq: Two times a day (BID) | RESPIRATORY_TRACT | 11 refills | Status: DC

## 2017-05-26 MED ORDER — BUDESONIDE-FORMOTEROL FUMARATE 160-4.5 MCG/ACT IN AERO: 2.0000 | INHALATION_SPRAY | Freq: Two times a day (BID) | RESPIRATORY_TRACT | 0 refills | Status: DC

## 2017-05-26 MED ORDER — ALBUTEROL SULFATE HFA 108 (90 BASE) MCG/ACT IN AERS: 2.0000 | INHALATION_SPRAY | Freq: Four times a day (QID) | RESPIRATORY_TRACT | 6 refills | Status: DC | PRN

## 2017-05-26 MED ORDER — METHYLPREDNISOLONE 4 MG PO TABS: ORAL_TABLET | ORAL | 0 refills | Status: DC

## 2017-05-26 NOTE — Patient Instructions (Signed)
Today we updated your med list in our EPIC system...    Continue your current medications the same...  We decided to add BUDESONIDE 0.25 twice daly via your NEBULIZER...    Do this with or after your DUONEB treatments...  Continue the SYMBICORT160-2sprays twice daily...  Add-in the [facility] 600mg  4 times daily w/ fluids...  Finally we wrote for the MEDROL 4mg  tabs at your request to use just when needed for severe wheezing  Call for any questions...  Let's plan a follow up visit in 6-8 weeks, sooner if needed for problems.Marland KitchenMarland Kitchen

## 2017-05-26 NOTE — Progress Notes (Signed)
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    Take 200 mg by mouth 3 (three) times daily as needed for cough.   BUDESONIDE-FORMOTEROL (SYMBICORT) 160-4.5 MCG/ACT INHALER    Inhale 2 puffs into the lungs 2 (two) times daily.   GUAIFENESIN (MUCINEX) 600 MG 12 HR TABLET    Take 600 mg by mouth 4 (four) times daily.   IBUPROFEN (ADVIL,MOTRIN) 200 MG TABLET    Take 400 mg by mouth every 6 (six) hours as needed for moderate pain.   IPRATROPIUM-ALBUTEROL (DUONEB) 0.5-2.5 (3) MG/3ML SOLN    Take 3 mLs by nebulization 3 (three) times daily.    LEVOTHYROXINE (SYNTHROID, LEVOTHROID) 100 MCG TABLET    TAKE 1 TABLET BY MOUTH EVERY DAY BEFORE BREAKFAST   MONTELUKAST (SINGULAIR) 10 MG TABLET    TAKE 1 TABLET(10 MG) BY MOUTH AT BEDTIME   TORSEMIDE (DEMADEX) 20 MG TABLET    TAKE 2 TABLETS(40 MG) BY MOUTH DAILY  Modified Medications   Modified Medication Previous Medication   ALBUTEROL (PROVENTIL HFA;VENTOLIN HFA) 108 (90 BASE) MCG/ACT INHALER albuterol (PROVENTIL HFA;VENTOLIN HFA) 108 (90 BASE) MCG/ACT inhaler      Inhale 2 puffs into the lungs every 6 (six) hours as needed. For wheezing    Inhale 2 puffs into the lungs every 6 (six) hours as needed. For wheezing   GABAPENTIN (NEURONTIN) 300 MG CAPSULE gabapentin (NEURONTIN) 300 MG capsule      TAKE 1 CAPSULE(300 MG) BY MOUTH THREE TIMES DAILY    Take 1 capsule (300 mg total) by mouth 3 (three) times daily.  Discontinued  Medications   BUDESONIDE-FORMOTEROL (SYMBICORT) 160-4.5 MCG/ACT INHALER    Inhale 2 puffs into the lungs 2 (two) times daily.   CLONAZEPAM (KLONOPIN) 0.5 MG TABLET    TK 1/2 TO 1 T PO BID PRA   FUROSEMIDE (LASIX) 20 MG TABLET    Take 20 mg by mouth 2 (two) times daily.    HYDROCHLOROTHIAZIDE (MICROZIDE) 12.5 MG CAPSULE       NEOMYCIN-POLYMYXIN-HYDROCORTISONE (CORTISPORIN) 1 % SOLN OTIC SOLUTION    Apply 1-2 drops to toe BID after soaking   TORSEMIDE (DEMADEX) 20 MG TABLET    TAKE 2 TABLETS(40 MG) BY MOUTH DAILY

## 2017-06-02 NOTE — Progress Notes (Addendum)
Subjective:   Jenny [name] is a 76 y.o. female who presents for Medicare Annual (Subsequent) preventive examination.  Review of Systems:  No ROS.  Medicare Wellness Visit. Additional risk factors are reflected in the social history.  Cardiac Risk Factors include: advanced age (>81men, >55 women);hypertension;sedentary lifestyle;obesity (BMI >30kg/m2);family history of premature cardiovascular disease Sleep patterns: Sleeps 4 hours, up to void x 4-8. Uses CPAP on weekends.  Home Safety/Smoke Alarms: Feels safe in home. Smoke alarms in place.  Living environment; residence and Firearm Safety: Lives with daughter and grandchildren in 65 story home.  Seat Belt Safety/Bike Helmet: Wears seat belt.   Female:   Pap-N/A    Mammo-09/26/2013, bx left. Will call to schedule        Dexa scan-Last > 5 years. Will call to schedule.          [facility]-Colonoscopy 08/24/2016, normal. No recall.      Objective:     Vitals: BP 132/76 (BP Location: Left Arm, Patient Position: Sitting, Cuff Size: Large)   Pulse 74   Temp 98.2 F (36.8 C) (Temporal)   Resp 18   Ht 5\' 6"  (1.676 m)   Wt 223 lb 12.8 oz (101.5 kg)   SpO2 98%   BMI 36.12 kg/m   Body mass index is 36.12 kg/m.   Tobacco History  Smoking Status  . Former Smoker  . Packs/day: 1.50  . Years: 30.00  . Types: Cigarettes  . Quit date: 10/13/1991  Smokeless Tobacco  . Never Used     Counseling given: Not Answered   Past Medical History:  Diagnosis Date  . ALLERGIC RHINITIS 08/23/2007  . Allergy    rhinitis  . 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 Anemia 09/21/2011  . Annual physical exam 08/06/2013  . Asthma    mild, intermittent  . Asthma 01/14/2011  . Atrophic vaginitis 06/17/2012  . BACK PAIN, LUMBAR 08/23/2007  . Bronchitis   . BRONCHITIS, RECURRENT 02/22/2008  . Cough    usually not productive  . Degenerative joint disease   . DEGENERATIVE JOINT DISEASE 02/22/2008  . Depression with anxiety 03/06/2008   Qualifier:  Diagnosis of  By: Lenna Gilford MD, [place], BORDERLINE 08/21/2008  . Diverticulosis of colon   . DIVERTICULOSIS OF COLON 02/22/2008  . Dysthymia   . 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  . GERD 08/23/2007  . GERD (gastroesophageal reflux disease)   . HAND PAIN 04/09/2010  . History of chicken pox 01/14/2011  . History of measles 01/14/2011  . Hyperglycemia 07/07/2015  . Hypertension   . HYPERTENSION 02/22/2008  . Hypothyroidism   . HYPOTHYROIDISM 08/23/2007  . Incontinence of urine   . Lumbar back pain   . Obesity   . OBESITY 08/23/2007  . Obstructive sleep apnea    does not use CPAP, lost weight  . Other urinary incontinence 03/12/2010  . Peripheral neuropathy 02/12/2011   possibly related to spondylolisthesis per pt  . PONV (postoperative nausea and vomiting)   . Sleep apnea   . SLEEP APNEA, OBSTRUCTIVE 08/23/2007  . Transient memory loss March 2006   after a stressful event she had no memory the rest of that day  . Urinary frequency   . Urinary incontinence   . UTI (lower urinary tract infection) 03/23/2012  . Venous insufficiency   . VENOUS INSUFFICIENCY 08/23/2007  . Vitamin D deficiency   . VITAMIN D DEFICIENCY 08/21/2008   Past Surgical History:  Procedure Laterality Date  . ABDOMINAL HYSTERECTOMY  took one ovary  . AP repair and sling for cystocele  8-04    Dr Gaynelle Arabian  . APPENDECTOMY    . carpal tunel  2008  . CHOLECYSTECTOMY    . ctr     bilateral  . left hand  2010   left thumb, joint removal, CTR  . NECK SURGERY     x 2 - discectomy, fusion  . SHOULDER ARTHROSCOPY DISTAL CLAVICLE EXCISION AND OPEN ROTATOR CUFF REPAIR     right  . TONSILLECTOMY AND ADENOIDECTOMY    . TUBAL LIGATION     Family History  Problem Relation Age of Onset  . COPD Mother   . Pneumonia Mother   . Other Mother        CHF  . Hypertension Mother   . Arthritis Mother        septic knee after replacement  . Heart disease Mother        chf  . Dementia Father   . Arthritis  Brother   . Asthma Daughter   . Colon polyps Daughter   . Stroke Maternal Grandmother   . Heart disease Maternal Grandmother   . Other Maternal Grandfather        CHF  . Heart disease Maternal Grandfather        chf  . Cancer Paternal Grandmother        stomach?  . Other Paternal Grandfather        problems with kidneys  . Kidney disease Paternal Grandfather   . Anxiety disorder Daughter   . Cancer Maternal Aunt        breast  . Cancer Maternal Aunt        liver  . Colon cancer Neg Hx    History  Sexual Activity  . Sexual activity: No    Outpatient Encounter Prescriptions as of 06/03/2017  Medication Sig  . albuterol (PROVENTIL HFA;VENTOLIN HFA) 108 (90 Base) MCG/ACT inhaler Inhale 2 puffs into the lungs every 6 (six) hours as needed. For wheezing  . ALPRAZolam (XANAX) 0.5 MG tablet TAKE 1 TABLET BY MOUTH TWICE DAILY AS NEEDED FOR ANXIETY OR SLEEP  . amLODipine (NORVASC) 5 MG tablet Take 1 tablet (5 mg total) by mouth daily.  Marland Kitchen aspirin EC 81 MG tablet Take 81 mg by mouth daily.    . budesonide (PULMICORT) 0.25 MG/2ML nebulizer solution Take 2 mLs (0.25 mg total) by nebulization 2 (two) times daily.  . budesonide-formoterol (SYMBICORT) 160-4.5 MCG/ACT inhaler Inhale 2 puffs into the lungs 2 (two) times daily. (Patient taking differently: Inhale 2 puffs into the lungs daily. )  . gabapentin (NEURONTIN) 300 MG capsule TAKE 1 CAPSULE(300 MG) BY MOUTH THREE TIMES DAILY  . guaiFENesin (MUCINEX) 600 MG 12 hr tablet Take 600 mg by mouth 4 (four) times daily.  Marland Kitchen ibuprofen (ADVIL,MOTRIN) 200 MG tablet Take 400 mg by mouth every 6 (six) hours as needed for moderate pain.  Marland Kitchen ipratropium-albuterol (DUONEB) 0.5-2.5 (3) MG/3ML SOLN Take 3 mLs by nebulization 3 (three) times daily.   Marland Kitchen levothyroxine (SYNTHROID, LEVOTHROID) 100 MCG tablet TAKE 1 TABLET BY MOUTH EVERY DAY BEFORE BREAKFAST  . methylPREDNISolone (MEDROL) 4 MG tablet Take as directed per Dr. Lenna Gilford for wheezing.  . montelukast  (SINGULAIR) 10 MG tablet TAKE 1 TABLET(10 MG) BY MOUTH AT BEDTIME  . torsemide (DEMADEX) 20 MG tablet TAKE 2 TABLETS(40 MG) BY MOUTH DAILY (Patient taking differently: 2 every other)  . [DISCONTINUED] benzonatate (TESSALON) 200 MG capsule Take 200 mg by mouth  3 (three) times daily as needed for cough.  . [DISCONTINUED] budesonide-formoterol (SYMBICORT) 160-4.5 MCG/ACT inhaler Inhale 2 puffs into the lungs 2 (two) times daily.   No facility-administered encounter medications on file as of 06/03/2017.     Activities of Daily Living In your present state of health, do you have any difficulty performing the following activities: 06/03/2017 11/09/2016  Hearing? N N  Vision? N N  Difficulty concentrating or making decisions? N N  Walking or climbing stairs? Y N  Comment back and breathing -  Dressing or bathing? N N  Doing errands, shopping? N N  Preparing Food and eating ? N N  Using the Toilet? N N  In the past six months, have you accidently leaked urine? Y Y  Comment Followed by Urology. H/O sling -  Do you have problems with loss of bowel control? N N  Managing your Medications? N N  Managing your Finances? N N  Housekeeping or managing your Housekeeping? N N  Some recent data might be hidden    Patient Care Team: Midge Minium, MD as PCP - General (Family Medicine) Noralee Space, MD as Consulting Physician (Pulmonary Disease) Ladene Artist, MD as Consulting Physician (Gastroenterology) Garrel Ridgel, DPM as Consulting Physician (Podiatry)    Assessment:    Physical assessment deferred to PCP.  Exercise Activities and Dietary recommendations Current Exercise Habits: The patient does not participate in regular exercise at present (Maintains housework), Exercise limited by: respiratory conditions(s);orthopedic condition(s) Diet (meal preparation, eat out, water intake, caffeinated beverages, dairy products, fruits and vegetables): Drinks buttermilk, soda and water.    Breakfast: bagel, sausage and egg Lunch: pb sandwich Dinner: protein and vegetable.       Plans to attend [employer].   Goals    . <enter goal here>          Maintain current health by staying as active as possible.        Fall Risk Fall Risk  06/03/2017 11/09/2016 06/25/2015 02/15/2015 08/06/2013  Falls in the past year? Yes No No No No  Comment ankle weak - - - -  Number falls in past yr: 1 - - - -  Injury with Fall? No - - - -  Risk for fall due to : Impaired balance/gait - - - -  Follow up Falls prevention discussed - - - -   Depression Screen PHQ 2/9 Scores 06/03/2017 11/09/2016 06/25/2015 02/15/2015  PHQ - 2 Score 4 0 2 0  PHQ- 9 Score 8 0 6 -     Cognitive Function       Ad8 score reviewed for issues:  Issues making decisions: no  Less interest in hobbies / activities: no  Repeats questions, stories (family complaining): no  Trouble using ordinary gadgets (microwave, computer, phone): no  Forgets the month or year: no  Mismanaging finances: no  Remembering appts: no  Daily problems with thinking and/or memory: no Ad8 score is=0     Immunization History  Administered Date(s) Administered  . Influenza Split 08/20/2011, 09/30/2012  . Influenza Whole 08/13/2013  . Influenza, High Dose Seasonal PF 10/13/2014, 09/03/2015, 06/03/2017  . Influenza-Unspecified 07/11/2016  . Pneumococcal Conjugate-13 03/16/2014  . Pneumococcal Polysaccharide-23 06/23/2016  . Tdap 09/22/2011  . Zoster 03/16/2014   Influenza, High Dose administered today.  Declines Shingrix at present time d/t insurance  Screening Tests Health Maintenance  Topic Date Due  . URINE MICROALBUMIN  01/14/2012  . HEMOGLOBIN A1C  04/30/2016  .  OPHTHALMOLOGY EXAM  03/30/2017  . INFLUENZA VACCINE  05/12/2017  . FOOT EXAM  07/12/2017 (Originally 11/14/1950)  . MAMMOGRAM  07/12/2017 (Originally 09/26/2014)  . DEXA SCAN  07/12/2017 (Originally 11/15/2003)  . TETANUS/TDAP  09/21/2021  . PNA  vac Low Risk Adult  Completed   Diabetic Foot Exam - Simple   Simple Foot Form Diabetic Foot exam was performed with the following findings:  Yes 06/03/2017  9:50 AM  Visual Inspection No deformities, no ulcerations, no other skin breakdown bilaterally:  Yes Sensation Testing Intact to touch and monofilament testing bilaterally:  Yes Pulse Check Posterior Tibialis and Dorsalis pulse intact bilaterally:  Yes Comments        Plan:    Call [phone] to schedule mammogram and bone scan.   Bring a copy of your living will and/or healthcare power of attorney to your next office visit.  Continue doing brain stimulating activities (puzzles, reading, adult coloring books, staying active) to keep memory sharp.   I have personally reviewed and noted the following in the patient's chart:   . Medical and social history . Use of alcohol, tobacco or illicit drugs  . Current medications and supplements . Functional ability and status . Nutritional status . Physical activity . Advanced directives . List of other physicians . Hospitalizations, surgeries, and ER visits in previous 12 months . Vitals . Screenings to include cognitive, depression, and falls . Referrals and appointments  In addition, I have reviewed and discussed with patient certain preventive protocols, quality metrics, and best practice recommendations. A written personalized care plan for preventive services as well as general preventive health recommendations were provided to patient.     Gerilyn Nestle, RN  06/03/2017   Review w/ documentation provided by RN.  [name], MD

## 2017-06-03 ENCOUNTER — Encounter: Payer: Self-pay | Admitting: Family Medicine

## 2017-06-03 ENCOUNTER — Ambulatory Visit: Payer: Medicare Other

## 2017-06-03 ENCOUNTER — Ambulatory Visit (INDEPENDENT_AMBULATORY_CARE_PROVIDER_SITE_OTHER): Payer: Medicare Other | Admitting: Family Medicine

## 2017-06-03 VITALS — BP 132/76 | HR 74 | Temp 98.2°F | Resp 18 | Ht 66.0 in | Wt 223.8 lb

## 2017-06-03 DIAGNOSIS — E559 Vitamin D deficiency, unspecified: Secondary | ICD-10-CM

## 2017-06-03 DIAGNOSIS — I10 Essential (primary) hypertension: Secondary | ICD-10-CM | POA: Diagnosis not present

## 2017-06-03 DIAGNOSIS — E2839 Other primary ovarian failure: Secondary | ICD-10-CM

## 2017-06-03 DIAGNOSIS — E039 Hypothyroidism, unspecified: Secondary | ICD-10-CM | POA: Diagnosis not present

## 2017-06-03 DIAGNOSIS — Z23 Encounter for immunization: Secondary | ICD-10-CM

## 2017-06-03 DIAGNOSIS — F418 Other specified anxiety disorders: Secondary | ICD-10-CM | POA: Diagnosis not present

## 2017-06-03 DIAGNOSIS — Z0000 Encounter for general adult medical examination without abnormal findings: Secondary | ICD-10-CM | POA: Diagnosis not present

## 2017-06-03 DIAGNOSIS — R94120 Abnormal auditory function study: Secondary | ICD-10-CM

## 2017-06-03 MED ORDER — ESCITALOPRAM OXALATE 5 MG PO TABS: 5.0000 mg | ORAL_TABLET | Freq: Every day | ORAL | 3 refills | Status: DC

## 2017-06-03 MED ORDER — NYSTATIN 100000 UNIT/GM EX POWD: Freq: Four times a day (QID) | CUTANEOUS | 3 refills | Status: DC

## 2017-06-03 NOTE — Assessment & Plan Note (Signed)
Chronic problem.  Currently asymptomatic.  Check labs.  Adjust meds prn  

## 2017-06-03 NOTE — Patient Instructions (Addendum)
Follow up in 6 months to recheck BP and cholesterol- sooner if needed We'll notify you of your lab results and make any changes if needed Continue to work on healthy diet and regular exercise If you change your mind on restarting the Lexapro- let me know! If you would like a urology referral for frequent urination- let me know! Please call [facility] and schedule your mammogram [phone] [facility] call you with your ENT appt for the hearing loss Call with any questions or concerns Hang in there!  Call [phone] to schedule mammogram and bone scan.   Bring a copy of your living will and/or healthcare power of attorney to your next office visit.  Continue doing brain stimulating activities (puzzles, reading, adult coloring books, staying active) to keep memory sharp.   Health Maintenance, Female Adopting a healthy lifestyle and getting preventive care can go a long way to promote health and wellness. Talk with your health care provider about what schedule of regular examinations is right for you. This is a good chance for you to check in with your provider about disease prevention and staying healthy. In between checkups, there are plenty of things you can do on your own. Experts have done a lot of research about which lifestyle changes and preventive measures are most likely to keep you healthy. Ask your health care provider for more information. Weight and diet Eat a healthy diet  Be sure to include plenty of vegetables, fruits, low-fat dairy products, and lean protein.  Do not eat a lot of foods high in solid fats, added sugars, or salt.  Get regular exercise. This is one of the most important things you can do for your health. ? Most adults should exercise for at least 150 minutes each week. The exercise should increase your heart rate and make you sweat (moderate-intensity exercise). ? Most adults should also do strengthening exercises at least twice a week. This is in  addition to the moderate-intensity exercise.  Maintain a healthy weight  Body mass index (BMI) is a measurement that can be used to identify possible weight problems. It estimates body fat based on height and weight. Your health care provider can help determine your BMI and help you achieve or maintain a healthy weight.  For females 4 years of age and older: ? A BMI below 18.5 is considered underweight. ? A BMI of 18.5 to 24.9 is normal. ? A BMI of 25 to 29.9 is considered overweight. ? A BMI of 30 and above is considered obese.  Watch levels of cholesterol and blood lipids  You should start having your blood tested for lipids and cholesterol at 78 years of age, then have this test every 5 years.  You may need to have your cholesterol levels checked more often if: ? Your lipid or cholesterol levels are high. ? You are older than 78 years of age. ? You are at high risk for heart disease.  Cancer screening Lung Cancer  Lung cancer screening is recommended for adults 53-76 years old who are at high risk for lung cancer because of a history of smoking.  A yearly low-dose CT scan of the lungs is recommended for people who: ? Currently smoke. ? Have quit within the past 15 years. ? Have at least a 30-pack-year history of smoking. A pack year is smoking an average of one pack of cigarettes a day for 1 year.  Yearly screening should continue until it has been 15 years since you  quit.  Yearly screening should stop if you develop a health problem that would prevent you from having lung cancer treatment.  Breast Cancer  Practice breast self-awareness. This means understanding how your breasts normally appear and feel.  It also means doing regular breast self-exams. Let your health care provider know about any changes, no matter how small.  If you are in your 20s or 30s, you should have a clinical breast exam (CBE) by a health care provider every 1-3 years as part of a regular health  exam.  If you are 17 or older, have a CBE every year. Also consider having a breast X-ray (mammogram) every year.  If you have a family history of breast cancer, talk to your health care provider about genetic screening.  If you are at high risk for breast cancer, talk to your health care provider about having an MRI and a mammogram every year.  Breast cancer gene (BRCA) assessment is recommended for women who have family members with BRCA-related cancers. BRCA-related cancers include: ? Breast. ? Ovarian. ? Tubal. ? Peritoneal cancers.  Results of the assessment will determine the need for genetic counseling and BRCA1 and BRCA2 testing.  Cervical Cancer Your health care provider may recommend that you be screened regularly for cancer of the pelvic organs (ovaries, uterus, and vagina). This screening involves a pelvic examination, including checking for microscopic changes to the surface of your cervix (Pap test). You may be encouraged to have this screening done every 3 years, beginning at age 41.  For women ages 18-65, health care providers may recommend pelvic exams and Pap testing every 3 years, or they may recommend the Pap and pelvic exam, combined with testing for human papilloma virus (HPV), every 5 years. Some types of HPV increase your risk of cervical cancer. Testing for HPV may also be done on women of any age with unclear Pap test results.  Other health care providers may not recommend any screening for nonpregnant women who are considered low risk for pelvic cancer and who do not have symptoms. Ask your health care provider if a screening pelvic exam is right for you.  If you have had past treatment for cervical cancer or a condition that could lead to cancer, you need Pap tests and screening for cancer for at least 20 years after your treatment. If Pap tests have been discontinued, your risk factors (such as having a new sexual partner) need to be reassessed to determine if  screening should resume. Some women have medical problems that increase the chance of getting cervical cancer. In these cases, your health care provider may recommend more frequent screening and Pap tests.  Colorectal Cancer  This type of cancer can be detected and often prevented.  Routine colorectal cancer screening usually begins at 78 years of age and continues through 78 years of age.  Your health care provider may recommend screening at an earlier age if you have risk factors for colon cancer.  Your health care provider may also recommend using home test kits to check for hidden blood in the stool.  A small camera at the end of a tube can be used to examine your colon directly (sigmoidoscopy or colonoscopy). This is done to check for the earliest forms of colorectal cancer.  Routine screening usually begins at age 36.  Direct examination of the colon should be repeated every 5-10 years through 78 years of age. However, you may need to be screened more often if early forms  of precancerous polyps or small growths are found.  Skin Cancer  Check your skin from head to toe regularly.  Tell your health care provider about any new moles or changes in moles, especially if there is a change in a mole's shape or color.  Also tell your health care provider if you have a mole that is larger than the size of a pencil eraser.  Always use sunscreen. Apply sunscreen liberally and repeatedly throughout the day.  Protect yourself by wearing long sleeves, pants, a wide-brimmed hat, and sunglasses whenever you are outside.  Heart disease, diabetes, and high blood pressure  High blood pressure causes heart disease and increases the risk of stroke. High blood pressure is more likely to develop in: ? People who have blood pressure in the high end of the normal range (130-139/85-89 mm Hg). ? People who are overweight or obese. ? People who are African American.  If you are 69-1 years of age, have  your blood pressure checked every 3-5 years. If you are 56 years of age or older, have your blood pressure checked every year. You should have your blood pressure measured twice-once when you are at a hospital or clinic, and once when you are not at a hospital or clinic. Record the average of the two measurements. To check your blood pressure when you are not at a hospital or clinic, you can use: ? An automated blood pressure machine at a pharmacy. ? A home blood pressure monitor.  If you are between 33 years and 29 years old, ask your health care provider if you should take aspirin to prevent strokes.  Have regular diabetes screenings. This involves taking a blood sample to check your fasting blood sugar level. ? If you are at a normal weight and have a low risk for diabetes, have this test once every three years after 78 years of age. ? If you are overweight and have a high risk for diabetes, consider being tested at a younger age or more often. Preventing infection Hepatitis B  If you have a higher risk for hepatitis B, you should be screened for this virus. You are considered at high risk for hepatitis B if: ? You were born in a country where hepatitis B is common. Ask your health care provider which countries are considered high risk. ? Your parents were born in a high-risk country, and you have not been immunized against hepatitis B (hepatitis B vaccine). ? You have HIV or AIDS. ? You use needles to inject street drugs. ? You live with someone who has hepatitis B. ? You have had sex with someone who has hepatitis B. ? You get hemodialysis treatment. ? You take certain medicines for conditions, including cancer, organ transplantation, and autoimmune conditions.  Hepatitis C  Blood testing is recommended for: ? Everyone born from [birth year] through 1967. ? Anyone with known risk factors for hepatitis C.  Sexually transmitted infections (STIs)  You should be screened for sexually  transmitted infections (STIs) including gonorrhea and chlamydia if: ? You are sexually active and are younger than 78 years of age. ? You are older than 78 years of age and your health care provider tells you that you are at risk for this type of infection. ? Your sexual activity has changed since you were last screened and you are at an increased risk for chlamydia or gonorrhea. Ask your health care provider if you are at risk.  If you do not have HIV, but  are at risk, it may be recommended that you take a prescription medicine daily to prevent HIV infection. This is called pre-exposure prophylaxis (PrEP). You are considered at risk if: ? You are sexually active and do not regularly use condoms or know the HIV status of your partner(s). ? You take drugs by injection. ? You are sexually active with a partner who has HIV.  Talk with your health care provider about whether you are at high risk of being infected with HIV. If you choose to begin PrEP, you should first be tested for HIV. You should then be tested every 3 months for as long as you are taking PrEP. Pregnancy  If you are premenopausal and you may become pregnant, ask your health care provider about preconception counseling.  If you may become pregnant, take 400 to 800 micrograms (mcg) of folic acid every day.  If you want to prevent pregnancy, talk to your health care provider about birth control (contraception). Osteoporosis and menopause  Osteoporosis is a disease in which the bones lose minerals and strength with aging. This can result in serious bone fractures. Your risk for osteoporosis can be identified using a bone density scan.  If you are 68 years of age or older, or if you are at risk for osteoporosis and fractures, ask your health care provider if you should be screened.  Ask your health care provider whether you should take a calcium or vitamin D supplement to lower your risk for osteoporosis.  Menopause may have  certain physical symptoms and risks.  Hormone replacement therapy may reduce some of these symptoms and risks. Talk to your health care provider about whether hormone replacement therapy is right for you. Follow these instructions at home:  Schedule regular health, dental, and eye exams.  Stay current with your immunizations.  Do not use any tobacco products including cigarettes, chewing tobacco, or electronic cigarettes.  If you are pregnant, do not drink alcohol.  If you are breastfeeding, limit how much and how often you drink alcohol.  Limit alcohol intake to no more than 1 drink per day for nonpregnant women. One drink equals 12 ounces of beer, 5 ounces of wine, or 1 ounces of hard liquor.  Do not use street drugs.  Do not share needles.  Ask your health care provider for help if you need support or information about quitting drugs.  Tell your health care provider if you often feel depressed.  Tell your health care provider if you have ever been abused or do not feel safe at home. This information is not intended to replace advice given to you by your health care provider. Make sure you discuss any questions you have with your health care provider. Document Released: 04/13/2011 Document Revised: 03/05/2016 Document Reviewed: 07/02/2015 Elsevier Interactive Patient Education  [employer].

## 2017-06-03 NOTE — Assessment & Plan Note (Signed)
Ongoing issue for pt.  She was previously on Lexapro.  Initially declined restarting Lexapro b/c she was fearful I would stop her Alprazolam.  Reassured her that this is not the case.  Pt then open to idea of restarting Lexapro.  Will follow.

## 2017-06-03 NOTE — Assessment & Plan Note (Signed)
Chronic problem.  Adequate control today.  Asymptomatic.  Check labs.  No anticipated med changes.  Will follow. 

## 2017-06-03 NOTE — Assessment & Plan Note (Signed)
Pt has hx of this.  Check labs.  Replete prn. 

## 2017-06-03 NOTE — Progress Notes (Signed)
   Subjective:    Patient ID: Jenny [name], female    DOB: [birth year], 76 y.o.   MRN: [id number]  HPI CPE- due for mammo and DEXA (ordered).  Got flu shot today.  Anxiety/Depression- pt's PHQ9 score is 8.  'it gets better, it has it's tendencies'.  Pt was previously on Lexapro.  Not interested in restarting.   Review of Systems Patient reports no vision changes, adenopathy,fever, weight change,  persistant/recurrent hoarseness , swallowing issues, chest pain, palpitations, edema, hemoptysis, dyspnea (rest/exertional/paroxysmal nocturnal), gastrointestinal bleeding (melena, rectal bleeding), abdominal pain, significant heartburn, bowel changes, Gyn symptoms (abnormal  bleeding, pain),  syncope, focal weakness, memory loss, skin/hair/nail changes, abnormal bruising or bleeding.   + hearing loss- referral placed + chronic cough + chronic numbness/tingling + nocturia    Objective:   Physical Exam General Appearance:    Alert, cooperative, no distress, appears stated age, obese  Head:    Normocephalic, without obvious abnormality, atraumatic  Eyes:    PERRL, conjunctiva/corneas clear, EOM's intact, fundi    benign, both eyes  Ears:    Normal TM's and external ear canals, both ears  Nose:   Nares normal, septum midline, mucosa normal, no drainage    or sinus tenderness  Throat:   Lips, mucosa, and tongue normal; teeth and gums normal  Neck:   Supple, symmetrical, trachea midline, no adenopathy;    Thyroid: no enlargement/tenderness/nodules  Back:     Symmetric, no curvature, ROM normal, no CVA tenderness  Lungs:     Clear to auscultation bilaterally, respirations unlabored  Chest Wall:    No tenderness or deformity   Heart:    Regular rate and rhythm, S1 and S2 normal, no murmur, rub   or gallop  Breast Exam:    Deferred to mammo  Abdomen:     Soft, non-tender, bowel sounds active all four quadrants,    no masses, no organomegaly  Genitalia:    Deferred to GYN  Rectal:      Extremities:   Extremities normal, atraumatic, no cyanosis or edema  Pulses:   2+ and symmetric all extremities  Skin:   Skin color, texture, turgor normal, no rashes or lesions  Lymph nodes:   Cervical, supraclavicular, and axillary nodes normal  Neurologic:   CNII-XII intact, normal strength, sensation and reflexes    throughout          Assessment & Plan:

## 2017-06-03 NOTE — Assessment & Plan Note (Signed)
Pt's PE WNL w/ exception of obesity.  Due for mammo and DEXA- referral placed.  UTD on colonoscopy.  Got flu shot today and UTD on immunizations.  Check labs.  Anticipatory guidance provided.

## 2017-06-04 ENCOUNTER — Other Ambulatory Visit: Payer: Medicare Other

## 2017-06-08 ENCOUNTER — Telehealth: Payer: Self-pay | Admitting: *Deleted

## 2017-06-08 NOTE — Telephone Encounter (Signed)
Patient is waiting for bruises from donating blood to heal before getting blood drawn.  Patient states that she will call us when her arm has healed and she will schedule a lab appointment.

## 2017-06-08 NOTE — Telephone Encounter (Signed)
-----   Message from Midge Minium, MD sent at 06/08/2017  7:59 AM EDT ----- Please call pt to schedule her labs  Thanks! KT ----- Message ----- From: SYSTEM Sent: 06/08/2017  12:06 AM To: Midge Minium, MD

## 2017-06-12 ENCOUNTER — Other Ambulatory Visit: Payer: Self-pay | Admitting: Family Medicine

## 2017-06-15 ENCOUNTER — Other Ambulatory Visit (INDEPENDENT_AMBULATORY_CARE_PROVIDER_SITE_OTHER): Payer: Medicare Other

## 2017-06-15 DIAGNOSIS — I10 Essential (primary) hypertension: Secondary | ICD-10-CM | POA: Diagnosis not present

## 2017-06-15 LAB — BASIC METABOLIC PANEL
BUN: 15 mg/dL (ref 6–23)
CO2: 30 meq/L (ref 19–32)
Calcium: 9.5 mg/dL (ref 8.4–10.5)
Chloride: 104 meq/L (ref 96–112)
Creatinine, Ser: 0.94 mg/dL (ref 0.40–1.20)
GFR: 61.12 mL/min (ref >60.00)
Glucose, Bld: 104 mg/dL — ABNORMAL HIGH (ref 70–99)
Potassium: 4.4 meq/L (ref 3.5–5.1)
Sodium: 143 meq/L (ref 135–145)

## 2017-06-15 LAB — CBC WITH DIFFERENTIAL/PLATELET
Basophils Absolute: 0.1 10*3/uL (ref 0.0–0.1)
Basophils Relative: 1.1 % (ref 0.0–3.0)
Eosinophils Absolute: 0.4 10*3/uL (ref 0.0–0.7)
Eosinophils Relative: 4.9 % (ref 0.0–5.0)
HCT: 40.3 % (ref 36.0–46.0)
Hemoglobin: 13 g/dL (ref 12.0–15.0)
Lymphocytes Relative: 30.5 % (ref 12.0–46.0)
Lymphs Abs: 2.4 10*3/uL (ref 0.7–4.0)
MCHC: 32.1 g/dL (ref 30.0–36.0)
MCV: 92.9 fl (ref 78.0–100.0)
Monocytes Absolute: 0.8 10*3/uL (ref 0.1–1.0)
Monocytes Relative: 9.9 % (ref 3.0–12.0)
Neutro Abs: 4.1 10*3/uL (ref 1.4–7.7)
Neutrophils Relative %: 53.6 % (ref 43.0–77.0)
Platelets: 300 10*3/uL (ref 150.0–400.0)
RBC: 4.34 Mil/uL (ref 3.87–5.11)
RDW: 14.5 % (ref 11.5–15.5)
WBC: 7.7 10*3/uL (ref 4.0–10.5)

## 2017-06-15 LAB — LIPID PANEL
Cholesterol: 132 mg/dL (ref 0–200)
HDL: 33.5 mg/dL — ABNORMAL LOW (ref >39.00)
LDL Cholesterol: 64 mg/dL (ref 0–99)
NonHDL: 98.67
Total CHOL/HDL Ratio: 4
Triglycerides: 172 mg/dL — ABNORMAL HIGH (ref 0.0–149.0)
VLDL: 34.4 mg/dL (ref 0.0–40.0)

## 2017-06-15 LAB — HEPATIC FUNCTION PANEL
ALT: 9 U/L (ref 0–35)
AST: 14 U/L (ref 0–37)
Albumin: 3.9 g/dL (ref 3.5–5.2)
Alkaline Phosphatase: 72 U/L (ref 39–117)
Bilirubin, Direct: 0.1 mg/dL (ref 0.0–0.3)
Total Bilirubin: 0.4 mg/dL (ref 0.2–1.2)
Total Protein: 6.3 g/dL (ref 6.0–8.3)

## 2017-06-15 LAB — VITAMIN D 25 HYDROXY (VIT D DEFICIENCY, FRACTURES): VITD: 27.41 ng/mL — ABNORMAL LOW (ref 30.00–100.00)

## 2017-06-15 LAB — TSH: TSH: 3.29 u[IU]/mL (ref 0.35–4.50)

## 2017-06-16 ENCOUNTER — Other Ambulatory Visit: Payer: Self-pay | Admitting: *Deleted

## 2017-06-16 DIAGNOSIS — E559 Vitamin D deficiency, unspecified: Secondary | ICD-10-CM

## 2017-06-16 MED ORDER — VITAMIN D (ERGOCALCIFEROL) 1.25 MG (50000 UNIT) PO CAPS: 50000.0000 [IU] | ORAL_CAPSULE | ORAL | 0 refills | Status: DC

## 2017-06-23 ENCOUNTER — Other Ambulatory Visit: Payer: Self-pay | Admitting: Family Medicine

## 2017-06-24 NOTE — Telephone Encounter (Signed)
Medication filled to pharmacy as requested.   

## 2017-06-24 NOTE — Telephone Encounter (Signed)
Last OV 06/03/17 Alprazolam last filled 04/26/17 #60 with 0

## 2017-06-29 ENCOUNTER — Ambulatory Visit
Admission: RE | Admit: 2017-06-29 | Discharge: 2017-06-29 | Disposition: A | Discharge status: Home / Self Care | Payer: Medicare Other | Source: Ambulatory Visit | Attending: Family Medicine | Admitting: Family Medicine

## 2017-06-29 ENCOUNTER — Encounter: Payer: Self-pay | Admitting: General Practice

## 2017-06-29 DIAGNOSIS — E2839 Other primary ovarian failure: Secondary | ICD-10-CM

## 2017-06-29 DIAGNOSIS — Z1231 Encounter for screening mammogram for malignant neoplasm of breast: Secondary | ICD-10-CM

## 2017-07-21 ENCOUNTER — Ambulatory Visit (INDEPENDENT_AMBULATORY_CARE_PROVIDER_SITE_OTHER): Payer: Medicare Other | Admitting: Pulmonary Disease

## 2017-07-21 ENCOUNTER — Encounter: Payer: Self-pay | Admitting: Pulmonary Disease

## 2017-07-21 VITALS — BP 126/66 | HR 73 | Ht 66.0 in | Wt 218.0 lb

## 2017-07-21 DIAGNOSIS — M545 Low back pain, unspecified: Secondary | ICD-10-CM

## 2017-07-21 DIAGNOSIS — R600 Localized edema: Secondary | ICD-10-CM

## 2017-07-21 DIAGNOSIS — I872 Venous insufficiency (chronic) (peripheral): Secondary | ICD-10-CM | POA: Diagnosis not present

## 2017-07-21 DIAGNOSIS — I10 Essential (primary) hypertension: Secondary | ICD-10-CM | POA: Diagnosis not present

## 2017-07-21 DIAGNOSIS — G4733 Obstructive sleep apnea (adult) (pediatric): Secondary | ICD-10-CM

## 2017-07-21 DIAGNOSIS — E66812 Obesity, class 2: Secondary | ICD-10-CM

## 2017-07-21 DIAGNOSIS — Z6835 Body mass index (BMI) 35.0-35.9, adult: Secondary | ICD-10-CM | POA: Diagnosis not present

## 2017-07-21 DIAGNOSIS — J4520 Mild intermittent asthma, uncomplicated: Secondary | ICD-10-CM | POA: Diagnosis not present

## 2017-07-21 DIAGNOSIS — G8929 Other chronic pain: Secondary | ICD-10-CM | POA: Diagnosis not present

## 2017-07-21 MED ORDER — ALPRAZOLAM 0.5 MG PO TABS: 0.2500 mg | ORAL_TABLET | Freq: Three times a day (TID) | ORAL | 0 refills | Status: DC | PRN

## 2017-07-21 NOTE — Progress Notes (Signed)
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 resting well, better energy & daytime alertness, asked to translate this into better diet & EXERCISE program to effect wt reduction; we plan ROV in 63mo...    06/23/16>   She has recurrent cough etc; needs NEB Bid-Tid + Symbicort, Singulair, Mucinex, Fluids, etc...needs DIET & EXERCISE w/ WT REDUCTION!  Take the [facility] more regularly;  Given ZPak for prn use...     03/15/17>   We discussed w/ Jenny [name] to start her NEB rx Qid, continue the Symbicort160-2spBid, add MUCINEX600 Qid, and we will place her on a MEDROL8mg - tapering sched (see AVS);  Finally we reviewed the Low sodium diet & decided to continue the Demadex40 but STOP the HCT12.5 at this point... we plan ROV in 3-4wks.    05/26/17>   Jenny [name] wants to use her extra Medrol 4mg  tabs as needed & says she's been doing this about 2x/wk & it really helps; I have warned her of the side effects and why I would prefer her to use the DUONEB regularly & add-in Budesonide 0.25 Bid regularly; rec to continue the SymbicortBid Singular daily & the Mucinex600Qid + fluids;  We plan recheck in 6-8wks..    07/21/17>   REC to continue her regular meds as outlined REGULARLY & continue the Medrol4mg  "pill-in-the-pocket per her request;  Needs better diet. Exercise, wt reduction;  Try the xanax 0.5mg  1/2 to 1 tab Tid more regularly   Patient's Medications  New Prescriptions   No medications on file  Previous  Medications   ALBUTEROL (PROVENTIL HFA;VENTOLIN HFA) 108 (90 BASE) MCG/ACT INHALER    Inhale 2 puffs into the lungs every 6 (six) hours as needed. For wheezing   AMLODIPINE (NORVASC) 5 MG TABLET    Take 1 tablet (5 mg total) by mouth daily.   ASPIRIN EC 81 MG TABLET    Take 81 mg by mouth daily.     BUDESONIDE (PULMICORT) 0.25 MG/2ML NEBULIZER SOLUTION    Take 2 mLs (0.25 mg total) by nebulization 2 (two) times daily.   BUDESONIDE-FORMOTEROL (SYMBICORT) 160-4.5 MCG/ACT INHALER    Inhale 2 puffs into the lungs 2 (two) times daily.   ESCITALOPRAM (LEXAPRO) 5 MG TABLET    Take 1 tablet (5 mg total) by mouth daily.   GABAPENTIN (NEURONTIN) 300 MG CAPSULE    TAKE 1 CAPSULE(300 MG) BY MOUTH THREE TIMES DAILY   GUAIFENESIN (MUCINEX) 600 MG 12 HR TABLET    Take 600 mg by mouth 4 (four) times daily.   IBUPROFEN (ADVIL,MOTRIN) 200 MG TABLET    Take 400 mg by mouth every 6 (six) hours as needed for moderate pain.   IPRATROPIUM-ALBUTEROL (DUONEB) 0.5-2.5 (3) MG/3ML SOLN    Take 3 mLs by nebulization 3 (three)  times daily.    LEVOTHYROXINE (SYNTHROID, LEVOTHROID) 100 MCG TABLET    TAKE 1 TABLET BY MOUTH EVERY DAY BEFORE BREAKFAST   METHYLPREDNISOLONE (MEDROL) 4 MG TABLET    Take as directed per Dr. Lenna Gilford for wheezing.   MONTELUKAST (SINGULAIR) 10 MG TABLET    TAKE 1 TABLET(10 MG) BY MOUTH AT BEDTIME   NYSTATIN (MYCOSTATIN/NYSTOP) POWDER    Apply topically 4 (four) times daily.   TORSEMIDE (DEMADEX) 20 MG TABLET    TAKE 2 TABLETS(40 MG) BY MOUTH DAILY   VITAMIN D, ERGOCALCIFEROL, (DRISDOL) 50000 UNITS CAPS CAPSULE    Take 1 capsule (50,000 Units total) by mouth every 7 (seven) days.  Modified Medications   Modified Medication Previous Medication   ALPRAZOLAM (XANAX) 0.5 MG TABLET ALPRAZolam (XANAX) 0.5 MG tablet      Take 0.5-1 tablets (0.25-0.5 mg total) by mouth 3 (three) times daily as needed for anxiety.    TAKE 1 TABLET BY MOUTH TWICE DAILY AS NEEDED FOR ANXIETY OR SLEEP  Discontinued Medications   No  medications on file

## 2017-07-21 NOTE — Patient Instructions (Signed)
Today we updated your med list in our EPIC system...    Continue your current medications the same...  Continue the NEBS twice daily & the Symbicort twice daily (spaced out as we discussed)...  Continue the [place] 4 times daily w/ fluids...  Continue the Medrol "pill-in-the-pocket" as we discussed...  Finally try the ALPRAZOLAM 0.5mg  tabs 1/2 to 1 tab regularly up to 3 times daily...  Call for any questions...  Let's plan a follow up visit in 46mo, sooner if needed for problems.Marland KitchenMarland Kitchen

## 2017-08-21 ENCOUNTER — Other Ambulatory Visit: Payer: Self-pay | Admitting: Pulmonary Disease

## 2017-08-22 ENCOUNTER — Other Ambulatory Visit: Payer: Self-pay | Admitting: Family Medicine

## 2017-08-23 NOTE — Telephone Encounter (Signed)
Per SN: Ok to refill, please refill X5.  Thanks.   -------- rx called in to pharmacy.  Nothing further needed.

## 2017-08-23 NOTE — Telephone Encounter (Signed)
Pt is requesting a refill on  Alprazolam 0.5mg  tab.   Last refill: 07/21/17 #90 with 0 refills take 0.5-1 tab TID prn   Last ov: 07/21/17 with SN  SN please advise on refill request.  Thanks!

## 2017-09-06 ENCOUNTER — Other Ambulatory Visit: Payer: Self-pay | Admitting: Family Medicine

## 2017-09-06 DIAGNOSIS — E559 Vitamin D deficiency, unspecified: Secondary | ICD-10-CM

## 2017-09-12 ENCOUNTER — Other Ambulatory Visit: Payer: Self-pay | Admitting: Family Medicine

## 2017-09-17 ENCOUNTER — Telehealth: Payer: Self-pay | Admitting: Pulmonary Disease

## 2017-09-17 MED ORDER — PREDNISONE 5 MG (21) PO TBPK: ORAL_TABLET | Freq: Every day | ORAL | 0 refills | Status: DC

## 2017-09-17 MED ORDER — AZITHROMYCIN 250 MG PO TABS: ORAL_TABLET | ORAL | 0 refills | Status: DC

## 2017-09-17 NOTE — Telephone Encounter (Signed)
Pt uses [facility] in [place].Jenny [name]

## 2017-09-17 NOTE — Telephone Encounter (Signed)
Spoke with pt who states that her cough is a productive cough with yellow mucus x2 days, has congestion in her head, and states that she is also wheezing.  Pt is wanting [place] to send in something for her. Dr. Lenna Gilford, please advise on this.  Thanks!

## 2017-09-17 NOTE — Telephone Encounter (Signed)
Rx for prednisone dose pack and z pack sent into pharmacy. Nothing further needed.

## 2017-09-20 ENCOUNTER — Ambulatory Visit: Payer: Medicare Other | Admitting: Pulmonary Disease

## 2017-09-22 ENCOUNTER — Ambulatory Visit: Payer: Medicare Other | Admitting: Pulmonary Disease

## 2017-09-22 ENCOUNTER — Encounter: Payer: Self-pay | Admitting: Pulmonary Disease

## 2017-09-22 VITALS — BP 124/72 | HR 74 | Temp 97.7°F | Ht 65.0 in | Wt 215.0 lb

## 2017-09-22 DIAGNOSIS — I10 Essential (primary) hypertension: Secondary | ICD-10-CM

## 2017-09-22 DIAGNOSIS — R609 Edema, unspecified: Secondary | ICD-10-CM | POA: Diagnosis not present

## 2017-09-22 DIAGNOSIS — J441 Chronic obstructive pulmonary disease with (acute) exacerbation: Secondary | ICD-10-CM

## 2017-09-22 DIAGNOSIS — G8929 Other chronic pain: Secondary | ICD-10-CM | POA: Diagnosis not present

## 2017-09-22 DIAGNOSIS — I872 Venous insufficiency (chronic) (peripheral): Secondary | ICD-10-CM | POA: Diagnosis not present

## 2017-09-22 DIAGNOSIS — Z6835 Body mass index (BMI) 35.0-35.9, adult: Secondary | ICD-10-CM | POA: Diagnosis not present

## 2017-09-22 DIAGNOSIS — M545 Low back pain: Secondary | ICD-10-CM | POA: Diagnosis not present

## 2017-09-22 DIAGNOSIS — J4530 Mild persistent asthma, uncomplicated: Secondary | ICD-10-CM

## 2017-09-22 DIAGNOSIS — G4733 Obstructive sleep apnea (adult) (pediatric): Secondary | ICD-10-CM | POA: Diagnosis not present

## 2017-09-22 DIAGNOSIS — R600 Localized edema: Secondary | ICD-10-CM

## 2017-09-22 DIAGNOSIS — E66812 Obesity, class 2: Secondary | ICD-10-CM

## 2017-09-22 MED ORDER — BUDESONIDE 0.25 MG/2ML IN SUSP: 0.2500 mg | Freq: Two times a day (BID) | RESPIRATORY_TRACT | 11 refills | Status: DC

## 2017-09-22 MED ORDER — BENZONATATE 100 MG PO CAPS: 100.0000 mg | ORAL_CAPSULE | Freq: Three times a day (TID) | ORAL | 0 refills | Status: DC

## 2017-09-22 MED ORDER — BUDESONIDE-FORMOTEROL FUMARATE 160-4.5 MCG/ACT IN AERO: 2.0000 | INHALATION_SPRAY | Freq: Two times a day (BID) | RESPIRATORY_TRACT | 3 refills | Status: DC

## 2017-09-22 MED ORDER — METHYLPREDNISOLONE 4 MG PO TABS: ORAL_TABLET | ORAL | 0 refills | Status: DC

## 2017-09-22 MED ORDER — IPRATROPIUM-ALBUTEROL 0.5-2.5 (3) MG/3ML IN SOLN: 3.0000 mL | Freq: Four times a day (QID) | RESPIRATORY_TRACT | 3 refills | Status: DC

## 2017-09-22 NOTE — Patient Instructions (Signed)
Today we updated your med list in our EPIC system...    Continue your current medications the same...  We adjusted your breathing regimen as follows>>    Do the DUONEB via your nebulizer 4 times daily (Breakfast, Lunch, Dinner, BEDtime)    Follow this by the BUDESONIDE twice daily (after the Duoneb at B & D)    And use the SYMBICORT 2sprays twice daily (after the Duoneb at L & BED)    Take your [facility] 600mg  4 times daily after these treatments (B, L, D, BED)    Continue the Singulair once daily...  We refilled your MEDROL 4mg  tabs for the as needed "pill-in-the-pocket" protocol...  Keep up the good work w/ diet & increase your EXERCISE programn (silver sneakers etc)...  Call for any questions...  Let's plan a follow up visit in 2-72mo, sooner if needed for problems.Marland KitchenMarland Kitchen

## 2017-09-22 NOTE — Progress Notes (Signed)
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 NAD... GENERAL:  Alert & oriented; pleasant & cooperative... HEENT:  [place]/AT, EOM-full, PERRLA, EACs-clear, TMs-wnl, NOSE-clear, THROAT-clear & wnl. NECK:  Supple w/ fairROM; no JVD; normal carotid impulses w/o bruits; no thyromegaly or nodules palpated; no lymphadenopathy. CHEST:  Clear to P & A; without wheezes/ rales/ or rhonchi. HEART:  Regular Rhythm; without murmurs/ rubs/ or gallops. ABDOMEN:  Obese w/ panniculus, soft & nontender; normal bowel sounds; no organomegaly or masses detected. EXT: without deformities, mild arthritic changes; no varicose veins/ +venous insuffic/ tr edema. NEURO:  CN's intact;  no focal neuro deficits... DERM:  No lesions noted; no rash etc...   Assessment & Plan:    IMP >>     Dyspnea>  This is multifactorial w/ components from AB, pulm restriction due to obesity, deconditioning, and a signif component due to anxiety/stress I believe=> we added KLONOPIN 0.5mg  1/2 Bid...    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    OSA>  Prev sleep study 2008 reviewed & she needs new ResMed S10 machine, supplies=> she loves it 14much improved...    Cardiac issues>  She saw DrHochrein in the past (2008) for HBP, CP, obesity, VI/edema; 2DEcho showed norm LVF, no regional wall motion abn, trivAI & trivMR; Myoview was ordered but ?never done?    Obesity/  IFG>  BMI=36 and she has Hx borderline DM- on diet alone...    Medical issues>  HBP, mixed hyperlipidemia, IFG, Hypothyroid, DJD, Anxiety; on Lisin5, Synthroid100, Xanax0.5.Marland KitchenMarland Kitchen    Ortho/ Neurosurg> she is s/p neck surgeries (DrBotero) and lumbar fusion as well; her orthopedist is Dr. Alphonzo Severance; on Neurontin [phone]...    Anxiety/ stress>  Her daugh was killed in a MVA 2001, she is raising the grand children, her mother passed away in her 4s, she works 9H/d sitting w an elderly pt, etc...   PLAN >>     02/10/16>   We discussed a trial of KLONOPIN 0.5mg Bid along w/ continuing her Symbicort160, Singulair10 & a trial of NEB meds Tid; we also rec a new CPAP set up w/ download in 30d;  I let her ventilate, she is in agreement w/ this plan & very appreciative.    04/07/16>   Problem list as above- we reviewed plans for [facility] but she prefers to use these just prn; she loves the new CPAP machine & is resting well, better energy & daytime alertness, asked to translate this into better diet & EXERCISE program to effect wt reduction; we plan ROV in 4mo...    06/23/16>   She has recurrent cough etc; needs NEB Bid-Tid + Symbicort, Singulair, Mucinex, Fluids, etc...needs DIET & EXERCISE w/ WT REDUCTION!  Take the [facility] more regularly;  Given ZPak for prn use.Marland KitchenMarland Kitchen  03/15/17>   We discussed w/ Jenny [name] to start her NEB rx Qid, continue the Symbicort160-2spBid, add MUCINEX600 Qid, and we will place her on a MEDROL8mg - tapering sched (see AVS);  Finally we reviewed the Low sodium diet & decided to continue the Demadex40 but STOP the HCT12.5 at this point... we plan ROV in 3-4wks.    05/26/17>   Jenny [name] wants to use her extra Medrol 4mg  tabs as needed & says she's been doing this about 2x/wk & it really helps; I have warned her of the side effects and why I would prefer her to use the DUONEB regularly & add-in Budesonide 0.25 Bid regularly; rec to continue the SymbicortBid Singular daily & the Mucinex600Qid +  fluids;  We plan recheck in 6-8wks..    07/21/17>   REC to continue her regular meds as outlined REGULARLY & continue the Medrol4mg  "pill-in-the-pocket per her request;  Needs better diet. Exercise, wt reduction;  Try the xanax 0.5mg  1/2 to 1 tab Tid more regularly    09/22/17>   Jenny [name] has hx refractory AB & is "dependent" on Medrol=> we discussed increasing DUONEB to Qid, continue Budes Bid, continue Symbicort160-2spBid, Singulair10, incr Mucinex600Qid w/ fluids; we refilled her MEDROL 4mg  tabs to continue her "pill in the pocket" regimen that she likes & advised to minimize the steroid use; we reviewed diet/ exercise/ wt reduction strategies...     Medication List        Accurate as of 09/22/17  1:25 PM. Always use your most recent med list.          albuterol 108 (90 Base) MCG/ACT inhaler Commonly known as:  PROVENTIL HFA;VENTOLIN HFA Inhale 2 puffs into the lungs every 6 (six) hours as needed. For wheezing   ALPRAZolam 0.5 MG tablet Commonly known as:  XANAX TAKE 1/2 TO 1 TABLET BY MOUTH THREE TIMES DAILY AS NEEDED FOR ANXIETY   amLODipine 5 MG tablet Commonly known as:  NORVASC Take 1 tablet (5 mg total) by mouth daily.   aspirin EC 81 MG tablet   benzonatate 100 MG capsule Commonly known as:  TESSALON Take 1 capsule (100 mg total) by mouth 3 (three) times daily.   budesonide 0.25 MG/2ML nebulizer solution Commonly known as:  PULMICORT Take 2 mLs (0.25 mg total) by nebulization 2 (two) times daily.   budesonide-formoterol 160-4.5 MCG/ACT inhaler Commonly known as:  SYMBICORT Inhale 2 puffs into the lungs 2 (two) times daily.   escitalopram 5 MG tablet Commonly known as:  LEXAPRO Take 1 tablet (5 mg total) by mouth daily.   gabapentin 300 MG capsule Commonly known as:  NEURONTIN TAKE 1 CAPSULE(300 MG) BY MOUTH THREE TIMES DAILY   guaiFENesin 600 MG 12 hr tablet Commonly known as:  MUCINEX   ibuprofen 200 MG tablet Commonly known as:  ADVIL,MOTRIN     ipratropium-albuterol 0.5-2.5 (3) MG/3ML Soln Commonly known as:  DUONEB Take 3 mLs by nebulization 4 (four) times daily.   levothyroxine 100 MCG tablet Commonly known as:  SYNTHROID, LEVOTHROID TAKE 1 TABLET BY MOUTH EVERY DAY BEFORE BREAKFAST   methylPREDNISolone 4 MG tablet Commonly known as:  MEDROL Take as directed per Dr. Lenna Gilford for wheezing.   montelukast 10 MG tablet Commonly known as:  SINGULAIR TAKE 1 TABLET(10 MG) BY MOUTH AT BEDTIME   nystatin powder Commonly known as:  MYCOSTATIN/NYSTOP Apply topically 4 (four) times daily.   predniSONE 5 MG (21) Tbpk tablet Commonly known as:  STERAPRED UNI-PAK 21 TAB Take by mouth daily. Take  as directed.   torsemide 20 MG tablet Commonly known as:  DEMADEX TAKE 2 TABLETS(40 MG) BY MOUTH DAILY   Vitamin D (Ergocalciferol) 50000 units Caps capsule Commonly known as:  DRISDOL Take 1 capsule (50,000 Units total) by mouth every 7 (seven) days.       Where to Get Your Medications    These medications were sent to [facility], [place] - [address]  [address] [place], [place] [address]   Phone:  [phone]   benzonatate 100 MG capsule  budesonide 0.25 MG/2ML nebulizer solution  ipratropium-albuterol 0.5-2.5 (3) MG/3ML Soln  methylPREDNISolone 4 MG tablet   Information about where to get these medications is not yet available   Ask your nurse or doctor about these medications  budesonide-formoterol 160-4.5 MCG/ACT inhaler

## 2017-10-28 ENCOUNTER — Ambulatory Visit (INDEPENDENT_AMBULATORY_CARE_PROVIDER_SITE_OTHER): Payer: Medicare HMO | Admitting: Physician Assistant

## 2017-10-28 ENCOUNTER — Encounter: Payer: Self-pay | Admitting: Physician Assistant

## 2017-10-28 ENCOUNTER — Other Ambulatory Visit: Payer: Self-pay

## 2017-10-28 VITALS — BP 100/60 | HR 78 | Temp 98.3°F | Resp 14 | Ht 65.0 in | Wt 223.0 lb

## 2017-10-28 DIAGNOSIS — H1013 Acute atopic conjunctivitis, bilateral: Secondary | ICD-10-CM

## 2017-10-28 MED ORDER — AZELASTINE HCL 0.05 % OP SOLN: 1.0000 [drp] | Freq: Two times a day (BID) | OPHTHALMIC | 0 refills | Status: DC

## 2017-10-28 NOTE — Progress Notes (Signed)
Patient presents to clinic today c/o 2 days of itchy, watery eyes with redness. Denies any trauma or injury. Denies any crusting or drainage. Denies eye pain, photophobia or vision changes. Denies recent travel or sick contact.  Past Medical History:  Diagnosis Date  . ALLERGIC RHINITIS 08/23/2007  . Allergy    rhinitis  . 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 Anemia 09/21/2011  . Annual physical exam 08/06/2013  . Asthma    mild, intermittent  . Asthma 01/14/2011  . Atrophic vaginitis 06/17/2012  . BACK PAIN, LUMBAR 08/23/2007  . Bronchitis   . BRONCHITIS, RECURRENT 02/22/2008  . Cough    usually not productive  . Degenerative joint disease   . DEGENERATIVE JOINT DISEASE 02/22/2008  . Depression with anxiety 03/06/2008   Qualifier: Diagnosis of  By: Lenna Gilford MD, Deborra Medina   . Diverticulosis of colon   . DIVERTICULOSIS OF COLON 02/22/2008  . Dysthymia   . 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  . elevated glucose 08/21/2008  . GERD 08/23/2007  . GERD (gastroesophageal reflux disease)   . HAND PAIN 04/09/2010  . History of chicken pox 01/14/2011  . History of measles 01/14/2011  . Hyperglycemia 07/07/2015  . Hypertension   . HYPERTENSION 02/22/2008  . Hypothyroidism   . HYPOTHYROIDISM 08/23/2007  . Incontinence of urine   . Lumbar back pain   . Obesity   . OBESITY 08/23/2007  . Obstructive sleep apnea    does not use CPAP, lost weight  . Other urinary incontinence 03/12/2010  . Peripheral neuropathy 02/12/2011   possibly related to spondylolisthesis per pt  . PONV (postoperative nausea and vomiting)   . Sleep apnea   . SLEEP APNEA, OBSTRUCTIVE 08/23/2007  . Transient memory loss March 2006   after a stressful event she had no memory the rest of that day  . Urinary frequency   . Urinary incontinence   . UTI (lower urinary tract infection) 03/23/2012  . Venous insufficiency   . VENOUS INSUFFICIENCY 08/23/2007  . Vitamin D deficiency   . VITAMIN D DEFICIENCY 08/21/2008    Current Outpatient  Medications on File Prior to Visit  Medication Sig Dispense Refill  . albuterol (PROVENTIL HFA;VENTOLIN HFA) 108 (90 Base) MCG/ACT inhaler Inhale 2 puffs into the lungs every 6 (six) hours as needed. For wheezing 1 Inhaler 6  . ALPRAZolam (XANAX) 0.5 MG tablet TAKE 1/2 TO 1 TABLET BY MOUTH THREE TIMES DAILY AS NEEDED FOR ANXIETY 90 tablet 5  . amLODipine (NORVASC) 5 MG tablet Take 1 tablet (5 mg total) by mouth daily. 90 tablet 1  . aspirin EC 81 MG tablet Take 81 mg by mouth daily.      . budesonide (PULMICORT) 0.25 MG/2ML nebulizer solution Take 2 mLs (0.25 mg total) by nebulization 2 (two) times daily. 120 mL 11  . budesonide-formoterol (SYMBICORT) 160-4.5 MCG/ACT inhaler Inhale 2 puffs into the lungs 2 (two) times daily. 1 Inhaler 3  . Cholecalciferol (VITAMIN D3) 2000 units TABS Take 1 tablet by mouth daily.    Marland Kitchen escitalopram (LEXAPRO) 5 MG tablet Take 1 tablet (5 mg total) by mouth daily. 30 tablet 3  . furosemide (LASIX) 20 MG tablet Take 20 mg by mouth. Take 2 tablet daily    . gabapentin (NEURONTIN) 300 MG capsule TAKE 1 CAPSULE(300 MG) BY MOUTH THREE TIMES DAILY 270 capsule 0  . guaiFENesin (MUCINEX) 600 MG 12 hr tablet Take 600 mg by mouth 4 (four) times daily.    Marland Kitchen ibuprofen (ADVIL,MOTRIN) 200 MG tablet  Take 400 mg by mouth every 6 (six) hours as needed for moderate pain.    Marland Kitchen ipratropium-albuterol (DUONEB) 0.5-2.5 (3) MG/3ML SOLN Take 3 mLs by nebulization 4 (four) times daily. 360 mL 3  . levothyroxine (SYNTHROID, LEVOTHROID) 100 MCG tablet TAKE 1 TABLET BY MOUTH EVERY DAY BEFORE BREAKFAST 90 tablet 0  . methylPREDNISolone (MEDROL) 4 MG tablet Take as directed per Dr. Lenna Gilford for wheezing. 30 tablet 0  . montelukast (SINGULAIR) 10 MG tablet TAKE 1 TABLET(10 MG) BY MOUTH AT BEDTIME 30 tablet 0  . torsemide (DEMADEX) 20 MG tablet TAKE 2 TABLETS(40 MG) BY MOUTH DAILY (Patient not taking: Reported on 10/28/2017) 180 tablet 1  . [DISCONTINUED] esomeprazole (NEXIUM) 40 MG capsule Take 40 mg  by mouth daily before breakfast.       No current facility-administered medications on file prior to visit.     Allergies  Allergen Reactions  . Prednisone Other (See Comments)    Confusion, dizziness Medrol is ok  . Codeine Nausea Only       . Tramadol Itching    Family History  Problem Relation Age of Onset  . COPD Mother   . Pneumonia Mother   . Other Mother        CHF  . Hypertension Mother   . Arthritis Mother        septic knee after replacement  . Heart disease Mother        chf  . Dementia Father   . Arthritis Brother   . Asthma Daughter   . Colon polyps Daughter   . Stroke Maternal Grandmother   . Heart disease Maternal Grandmother   . Other Maternal Grandfather        CHF  . Heart disease Maternal Grandfather        chf  . Cancer Paternal Grandmother        stomach?  . Other Paternal Grandfather        problems with kidneys  . Kidney disease Paternal Grandfather   . Anxiety disorder Daughter   . Cancer Maternal Aunt        breast  . Cancer Maternal Aunt        liver  . Colon cancer Neg Hx     Social History   Socioeconomic History  . Marital status: Widowed    Spouse name: None  . Number of children: None  . Years of education: None  . Highest education level: None  Social Needs  . Financial resource strain: None  . Food insecurity - worry: None  . Food insecurity - inability: None  . Transportation needs - medical: None  . Transportation needs - non-medical: None  Occupational History  . None  Tobacco Use  . Smoking status: Former Smoker    Packs/day: 1.50    Years: 30.00    Pack years: 45.00    Types: Cigarettes    Last attempt to quit: 10/13/1991    Years since quitting: 26.0  . Smokeless tobacco: Never Used  Substance and Sexual Activity  . Alcohol use: No    Alcohol/week: 0.0 oz  . Drug use: No  . Sexual activity: No  Other Topics Concern  . None  Social History Narrative  . None    Review of Systems - See HPI.  All  other ROS are negative.  BP 100/60   Pulse 78   Temp 98.3 F (36.8 C) (Oral)   Resp 14   Ht 5\' 5"  (1.651 m)  Wt 223 lb (101.2 kg)   SpO2 96%   BMI 37.11 kg/m   Physical Exam  Constitutional: She is well-developed, well-nourished, and in no distress.  HENT:  Head: Normocephalic and atraumatic.  Eyes: EOM are normal. Pupils are equal, round, and reactive to light. Right eye exhibits no discharge, no exudate and no hordeolum. No foreign body present in the right eye. Left eye exhibits no discharge, no exudate and no hordeolum. No foreign body present in the left eye. Right conjunctiva is injected. Right conjunctiva has no hemorrhage. Left conjunctiva is injected. Left conjunctiva has no hemorrhage.  Neck: Neck supple.  Cardiovascular: Normal rate, regular rhythm, normal heart sounds and intact distal pulses.  Pulmonary/Chest: Effort normal and breath sounds normal. No respiratory distress. She has no wheezes.  Vitals reviewed.  Assessment/Plan: 1. Allergic conjunctivitis of both eyes Supportive measures reviewed. Start Astelin OP. Strict return precautions reviewed.   Leeanne Rio, PA-C

## 2017-10-28 NOTE — Patient Instructions (Signed)
Please continue cold compresses.  Use the Astelin OP drops twice daily as directed. Avoid touching the eyes.  If you note any increased redness or development of crusting, please call me.   Allergic Conjunctivitis A clear membrane (conjunctiva) covers the white part of your eye and the inner surface of your eyelid. Allergic conjunctivitis happens when this membrane has inflammation. This is caused by allergies. Common causes of allergic reactions (allergens)include:  Outdoor allergens, such as: ? Pollen. ? Grass and weeds. ? Mold spores.  Indoor allergens, such as: ? Dust. ? Smoke. ? Mold. ? Pet dander. ? Animal hair.  This condition can make your eye red or pink. It can also make your eye feel itchy. This condition cannot be spread from one person to another person (is not contagious). Follow these instructions at home:  Try not to be around things that you are allergic to.  Take or apply over-the-counter and prescription medicines only as told by your doctor. These include any eye drops.  Place a cool, clean washcloth on your eye for 10-20 minutes. Do this 3-4 times a day.  Do not touch or rub your eyes.  Do not wear contact lenses until the inflammation is gone. Wear glasses instead.  Do not wear eye makeup until the inflammation is gone.  Keep all follow-up visits as told by your doctor. This is important. Contact a doctor if:  Your symptoms get worse.  Your symptoms do not get better with treatment.  You have mild eye pain.  You are sensitive to light,  You have spots or blisters on your eyes.  You have pus coming from your eye.  You have a fever. Get help right away if:  You have redness, swelling, or other symptoms in only one eye.  Your vision is blurry.  You have vision changes.  You have very bad eye pain. Summary  Allergic conjunctivitis is caused by allergies. It can make your eye red or pink, and it can make your eye feel itchy.  This  condition cannot be spread from one person to another person (is not contagious).  Try not to be around things that you are allergic to.  Take or apply over-the-counter and prescription medicines only as told by your doctor. These include any eye drops.  Contact your doctor if your symptoms get worse or they do not get better with treatment. This information is not intended to replace advice given to you by your health care provider. Make sure you discuss any questions you have with your health care provider. Document Released: 03/18/2010 Document Revised: 05/22/2016 Document Reviewed: 05/22/2016 Elsevier Interactive Patient Education  2017 [employer].

## 2017-11-03 ENCOUNTER — Telehealth (INDEPENDENT_AMBULATORY_CARE_PROVIDER_SITE_OTHER): Payer: Self-pay | Admitting: Orthopedic Surgery

## 2017-11-03 NOTE — Telephone Encounter (Signed)
Returned call to patient left message to return call [phone]

## 2017-11-07 ENCOUNTER — Other Ambulatory Visit: Payer: Self-pay | Admitting: Family Medicine

## 2017-11-10 ENCOUNTER — Ambulatory Visit (INDEPENDENT_AMBULATORY_CARE_PROVIDER_SITE_OTHER): Payer: Medicare HMO

## 2017-11-10 ENCOUNTER — Ambulatory Visit (INDEPENDENT_AMBULATORY_CARE_PROVIDER_SITE_OTHER): Payer: Medicare HMO | Admitting: Orthopedic Surgery

## 2017-11-10 ENCOUNTER — Encounter (INDEPENDENT_AMBULATORY_CARE_PROVIDER_SITE_OTHER): Payer: Self-pay | Admitting: Orthopedic Surgery

## 2017-11-10 DIAGNOSIS — G8929 Other chronic pain: Secondary | ICD-10-CM

## 2017-11-10 DIAGNOSIS — M25511 Pain in right shoulder: Secondary | ICD-10-CM

## 2017-11-10 DIAGNOSIS — M542 Cervicalgia: Secondary | ICD-10-CM

## 2017-11-10 NOTE — Progress Notes (Signed)
Office Visit Note   Patient: Jenny [name]           Date of Birth: 1941/03/15           MRN: [id number] Visit Date: 11/10/2017 Requested by: Midge Minium, MD [address] [place], [place] [address] PCP: Midge Minium, MD  Subjective: Chief Complaint  Patient presents with  . Right Shoulder - Pain  . Neck - Pain    HPI: Jenny [name] is a 77 year old patient with right shoulder and neck pain.  Denies any history of injury.  Has been going on for 6 months worse over the last 4 weeks.  She states that at times her "head feels too heavy for her neck".  She does have a remote history of right shoulder rotator cuff repair.  She is having trapezial pain on the right-hand side.  She did have an injection in her lower spine which did not help.  She does work at night.  She has difficulty with rotation of her head.  She is also having headaches.              ROS: All systems reviewed are negative as they relate to the chief complaint within the history of present illness.  Patient denies  fevers or chills.   Assessment & Plan: Visit Diagnoses:  1. Neck pain   2. Chronic right shoulder pain     Plan: Impression is some right shoulder mechanical symptoms but normal looking x-rays in a patient with a history of rotator cuff tear repair.  She may have re-torn that rotator cuff but her pain is primarily localizing the shoulder and trapezial region.  Strength is good and there is no evidence of frozen shoulder.  I would like to try a subacromial injection in the shoulder.  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  We need MRI of the cervical spine to evaluate for that with possible ESI's to follow.  Follow-Up Instructions: Return for after MRI.   Orders:  Orders Placed This Encounter  Procedures  . XR Cervical Spine 2 or 3 views  . XR Shoulder Right  . MR Cervical Spine w/o contrast   No orders of  the defined types were placed in this encounter.     Procedures: No procedures performed   Clinical Data: No additional findings.  Objective: Vital Signs: There were no vitals taken for this visit.  Physical Exam:   Constitutional: Patient appears well-developed HEENT:  Head: Normocephalic Eyes:EOM are normal Neck: Normal range of motion Cardiovascular: Normal rate Pulmonary/chest: Effort normal Neurologic: Patient is alert Skin: Skin is warm Psychiatric: Patient has normal mood and affect    Ortho Exam: Orthopedic exam demonstrates good cervical spine range of motion flexion and extension to about 40 degrees each.  Rotation is about also 45 degrees in either direction.  Motor sensory function to the hand is intact.  Reflexes symmetric.  Radial pulse intact bilaterally.  The right shoulder exam demonstrates a little bit of clicking with passive range of motion but good rotator cuff strength testing to infraspinatus supraspinatus and subscap muscle testing.  Well-healed surgical incision noted on the shoulder region.  No other masses lymphadenopathy or skin changes noted in the neck or shoulder region  Specialty Comments:  No specialty comments available.  Imaging: Xr Cervical Spine 2 Or 3 Views  Result Date: 11/10/2017 AP lateral cervical spine reviewed.  Solid plate fixation at A4-1 C4-5 with plate fixation is present.  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  Facet arthritis also present.  No spondylolisthesis.  Fusion looks solid with no evidence of lucencies around the screws.  Xr Shoulder Right  Result Date: 11/10/2017 AP lateral outlet right shoulder reviewed.  Prior distal clavicle excision has been performed.  There is no narrowing of the acromiohumeral distance and no glenohumeral arthritis present.  Visualized lung fields clear    PMFS History: Patient Active Problem List   Diagnosis Date Noted  . Peripheral edema 02/22/2017  .  Localized edema 12/17/2016  . Colon cancer screening 06/15/2016  . 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 Other fatigue 11/03/2015  . Hyperglycemia 07/07/2015  . Ceruminosis 03/07/2015  . Left hip pain 04/26/2014  . Acute pain of left knee 04/26/2014  . Annual physical exam 08/06/2013  . Atrophic vaginitis 06/17/2012  . Peripheral vascular disease, unspecified ([place]) 06/02/2012  . Urticaria 10/15/2011  . Anemia 09/21/2011  . 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  . Vitamin D deficiency 02/12/2011  . Peripheral neuropathy 02/12/2011  . Intertrigo 01/21/2011  . Asthma 01/14/2011  . 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 History of chicken pox 01/14/2011  . History of measles 01/14/2011  . HAND PAIN 04/09/2010  . OTHER URINARY INCONTINENCE 03/12/2010  . Depression with anxiety 03/06/2008  . Essential hypertension 02/22/2008  . Chronic bronchitis ([place]) 02/22/2008  . DIVERTICULOSIS OF COLON 02/22/2008  . Unilateral primary osteoarthritis, left knee 02/22/2008  . Hypothyroidism 08/23/2007  . Obesity 08/23/2007  . Obstructive sleep apnea 08/23/2007  . Venous (peripheral) insufficiency 08/23/2007  . ALLERGIC RHINITIS 08/23/2007  . GERD 08/23/2007  . BACK PAIN, LUMBAR 08/23/2007   Past Medical History:  Diagnosis Date  . ALLERGIC RHINITIS 08/23/2007  . Allergy    rhinitis  . 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 Anemia 09/21/2011  . Annual physical exam 08/06/2013  . Asthma    mild, intermittent  . Asthma 01/14/2011  . Atrophic vaginitis 06/17/2012  . BACK PAIN, LUMBAR 08/23/2007  . Bronchitis   . BRONCHITIS, RECURRENT 02/22/2008  . Cough    usually not productive  . Degenerative joint disease   . DEGENERATIVE JOINT DISEASE 02/22/2008  . Depression with anxiety 03/06/2008   Qualifier: Diagnosis of  By: Lenna Gilford MD, Deborra Medina   . Diverticulosis of colon   . DIVERTICULOSIS OF COLON 02/22/2008  . Dysthymia   . 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  . elevated glucose 08/21/2008  . GERD 08/23/2007   . GERD (gastroesophageal reflux disease)   . HAND PAIN 04/09/2010  . History of chicken pox 01/14/2011  . History of measles 01/14/2011  . Hyperglycemia 07/07/2015  . Hypertension   . HYPERTENSION 02/22/2008  . Hypothyroidism   . HYPOTHYROIDISM 08/23/2007  . Incontinence of urine   . Lumbar back pain   . Obesity   . OBESITY 08/23/2007  . Obstructive sleep apnea    does not use CPAP, lost weight  . Other urinary incontinence 03/12/2010  . Peripheral neuropathy 02/12/2011   possibly related to spondylolisthesis per pt  . PONV (postoperative nausea and vomiting)   . Sleep apnea   . SLEEP APNEA, OBSTRUCTIVE 08/23/2007  . Transient memory loss March 2006   after a stressful event she had no memory the rest of that day  . Urinary frequency   . Urinary incontinence   . UTI (lower urinary tract infection) 03/23/2012  . Venous insufficiency   .  VENOUS INSUFFICIENCY 08/23/2007  . Vitamin D deficiency   . VITAMIN D DEFICIENCY 08/21/2008    Family History  Problem Relation Age of Onset  . COPD Mother   . Pneumonia Mother   . Other Mother        CHF  . Hypertension Mother   . Arthritis Mother        septic knee after replacement  . Heart disease Mother        chf  . Dementia Father   . Arthritis Brother   . Asthma Daughter   . Colon polyps Daughter   . Stroke Maternal Grandmother   . Heart disease Maternal Grandmother   . Other Maternal Grandfather        CHF  . Heart disease Maternal Grandfather        chf  . Cancer Paternal Grandmother        stomach?  . Other Paternal Grandfather        problems with kidneys  . Kidney disease Paternal Grandfather   . Anxiety disorder Daughter   . Cancer Maternal Aunt        breast  . Cancer Maternal Aunt        liver  . Colon cancer Neg Hx     Past Surgical History:  Procedure Laterality Date  . ABDOMINAL HYSTERECTOMY     took one ovary  . AP repair and sling for cystocele  8-04    Dr Gaynelle Arabian  . APPENDECTOMY    . carpal tunel   2008  . CHOLECYSTECTOMY    . ctr     bilateral  . left hand  2010   left thumb, joint removal, CTR  . NECK SURGERY     x 2 - discectomy, fusion  . SHOULDER ARTHROSCOPY DISTAL CLAVICLE EXCISION AND OPEN ROTATOR CUFF REPAIR     right  . TONSILLECTOMY AND ADENOIDECTOMY    . TUBAL LIGATION     Social History   Occupational History  . Not on file  Tobacco Use  . Smoking status: Former Smoker    Packs/day: 1.50    Years: 30.00    Pack years: 45.00    Types: Cigarettes    Last attempt to quit: 10/13/1991    Years since quitting: 26.0  . Smokeless tobacco: Never Used  Substance and Sexual Activity  . Alcohol use: No    Alcohol/week: 0.0 oz  . Drug use: No  . Sexual activity: No

## 2017-11-14 ENCOUNTER — Other Ambulatory Visit: Payer: Self-pay | Admitting: Family Medicine

## 2017-11-17 ENCOUNTER — Other Ambulatory Visit: Payer: Self-pay | Admitting: Family Medicine

## 2017-11-21 ENCOUNTER — Ambulatory Visit
Admission: RE | Admit: 2017-11-21 | Discharge: 2017-11-21 | Disposition: A | Discharge status: Home / Self Care | Payer: Medicare HMO | Source: Ambulatory Visit | Attending: Orthopedic Surgery | Admitting: Orthopedic Surgery

## 2017-11-21 DIAGNOSIS — M4802 Spinal stenosis, cervical region: Secondary | ICD-10-CM | POA: Diagnosis not present

## 2017-11-21 DIAGNOSIS — M542 Cervicalgia: Secondary | ICD-10-CM

## 2017-11-24 ENCOUNTER — Encounter (INDEPENDENT_AMBULATORY_CARE_PROVIDER_SITE_OTHER): Payer: Self-pay | Admitting: Orthopedic Surgery

## 2017-11-24 ENCOUNTER — Ambulatory Visit (INDEPENDENT_AMBULATORY_CARE_PROVIDER_SITE_OTHER): Payer: Medicare HMO | Admitting: Orthopedic Surgery

## 2017-11-24 DIAGNOSIS — M542 Cervicalgia: Secondary | ICD-10-CM | POA: Diagnosis not present

## 2017-11-27 NOTE — Progress Notes (Signed)
Office Visit Note   Patient: Jenny [name]           Date of Birth: 02-02-1941           MRN: [id number] Visit Date: 11/24/2017 Requested by: Midge Minium, MD [address] [place], [place] [address] PCP: Midge Minium, MD  Subjective: Chief Complaint  Patient presents with  . Neck - Follow-up    HPI: Jenny [name] is a patient with cervical spine pain.  Had an injection in her right shoulder which only helped for 2 days.  Since I have seen her she has had an MRI scan of her cervical spine.  That shows solid fusion at C3-C6.  She does have some ligamentum flavum hypertrophy and canal stenosis within that solid fusion.  There is a little bit of left-sided C6-7 foraminal impingement.  He states that ibuprofen helps her symptoms as well as Neurontin.  Her symptoms do not rise to the level of requiring further injections in the neck.  As per her request.              ROS: All systems reviewed are negative as they relate to the chief complaint within the history of present illness.  Patient denies  fevers or chills.   Assessment & Plan: Visit Diagnoses:  1. Cervicalgia     Plan: Impression is neck pain with reasonable looking cervical spine MRI.  She wants to live with her current symptoms.  They have been improving over the past several months.  Should she develop recurrent symptoms I would favor a trial of an injection into the neck.  She will consider that option and call me if she wants to pursue it but for now we will continue the course.  Follow-up with me as needed  Follow-Up Instructions: Return if symptoms worsen or fail to improve.   Orders:  No orders of the defined types were placed in this encounter.  No orders of the defined types were placed in this encounter.     Procedures: No procedures performed   Clinical Data: No additional findings.  Objective: Vital Signs: There were no vitals taken for this visit.  Physical Exam:   Constitutional:  Patient appears well-developed HEENT:  Head: Normocephalic Eyes:EOM are normal Neck: Normal range of motion Cardiovascular: Normal rate Pulmonary/chest: Effort normal Neurologic: Patient is alert Skin: Skin is warm Psychiatric: Patient has normal mood and affect    Ortho Exam: Orthopedic exam demonstrates good cervical spine range of motion and good strength in both shoulders.  Good motor sensory function in the hands.  Neck range of motion predictably limited.  Not severely so.  Rest of her orthopedic examination is unchanged.  Specialty Comments:  No specialty comments available.  Imaging: No results found.   PMFS History: Patient Active Problem List   Diagnosis Date Noted  . Peripheral edema 02/22/2017  . Localized edema 12/17/2016  . Colon cancer screening 06/15/2016  . 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 Other fatigue 11/03/2015  . Hyperglycemia 07/07/2015  . Ceruminosis 03/07/2015  . Left hip pain 04/26/2014  . Acute pain of left knee 04/26/2014  . Annual physical exam 08/06/2013  . Atrophic vaginitis 06/17/2012  . Peripheral vascular disease, unspecified ([place]) 06/02/2012  . Urticaria 10/15/2011  . Anemia 09/21/2011  . 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  . Vitamin D deficiency 02/12/2011  . Peripheral neuropathy 02/12/2011  . Intertrigo 01/21/2011  . Asthma 01/14/2011  . 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  History of chicken pox 01/14/2011  . History of measles 01/14/2011  . HAND PAIN 04/09/2010  . OTHER URINARY INCONTINENCE 03/12/2010  . Depression with anxiety 03/06/2008  . Essential hypertension 02/22/2008  . Chronic bronchitis ([place]) 02/22/2008  . DIVERTICULOSIS OF COLON 02/22/2008  . Unilateral primary osteoarthritis, left knee 02/22/2008  . Hypothyroidism 08/23/2007  . Obesity 08/23/2007  . Obstructive sleep apnea 08/23/2007  . Venous (peripheral) insufficiency 08/23/2007  . ALLERGIC RHINITIS 08/23/2007  . GERD 08/23/2007  . BACK  PAIN, LUMBAR 08/23/2007   Past Medical History:  Diagnosis Date  . ALLERGIC RHINITIS 08/23/2007  . Allergy    rhinitis  . 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 Anemia 09/21/2011  . Annual physical exam 08/06/2013  . Asthma    mild, intermittent  . Asthma 01/14/2011  . Atrophic vaginitis 06/17/2012  . BACK PAIN, LUMBAR 08/23/2007  . Bronchitis   . BRONCHITIS, RECURRENT 02/22/2008  . Cough    usually not productive  . Degenerative joint disease   . DEGENERATIVE JOINT DISEASE 02/22/2008  . Depression with anxiety 03/06/2008   Qualifier: Diagnosis of  By: Lenna Gilford MD, Deborra Medina   . Diverticulosis of colon   . DIVERTICULOSIS OF COLON 02/22/2008  . Dysthymia   . 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  . elevated glucose 08/21/2008  . GERD 08/23/2007  . GERD (gastroesophageal reflux disease)   . HAND PAIN 04/09/2010  . History of chicken pox 01/14/2011  . History of measles 01/14/2011  . Hyperglycemia 07/07/2015  . Hypertension   . HYPERTENSION 02/22/2008  . Hypothyroidism   . HYPOTHYROIDISM 08/23/2007  . Incontinence of urine   . Lumbar back pain   . Obesity   . OBESITY 08/23/2007  . Obstructive sleep apnea    does not use CPAP, lost weight  . Other urinary incontinence 03/12/2010  . Peripheral neuropathy 02/12/2011   possibly related to spondylolisthesis per pt  . PONV (postoperative nausea and vomiting)   . Sleep apnea   . SLEEP APNEA, OBSTRUCTIVE 08/23/2007  . Transient memory loss March 2006   after a stressful event she had no memory the rest of that day  . Urinary frequency   . Urinary incontinence   . UTI (lower urinary tract infection) 03/23/2012  . Venous insufficiency   . VENOUS INSUFFICIENCY 08/23/2007  . Vitamin D deficiency   . VITAMIN D DEFICIENCY 08/21/2008    Family History  Problem Relation Age of Onset  . COPD Mother   . Pneumonia Mother   . Other Mother        CHF  . Hypertension Mother   . Arthritis Mother        septic knee after replacement  . Heart disease Mother        chf    . Dementia Father   . Arthritis Brother   . Asthma Daughter   . Colon polyps Daughter   . Stroke Maternal Grandmother   . Heart disease Maternal Grandmother   . Other Maternal Grandfather        CHF  . Heart disease Maternal Grandfather        chf  . Cancer Paternal Grandmother        stomach?  . Other Paternal Grandfather        problems with kidneys  . Kidney disease Paternal Grandfather   . Anxiety disorder Daughter   . Cancer Maternal Aunt        breast  . Cancer Maternal Aunt  liver  . Colon cancer Neg Hx     Past Surgical History:  Procedure Laterality Date  . ABDOMINAL HYSTERECTOMY     took one ovary  . AP repair and sling for cystocele  8-04    Dr Gaynelle Arabian  . APPENDECTOMY    . carpal tunel  2008  . CHOLECYSTECTOMY    . ctr     bilateral  . left hand  2010   left thumb, joint removal, [facility]  . NECK SURGERY     x 2 - discectomy, fusion  . SHOULDER ARTHROSCOPY DISTAL CLAVICLE EXCISION AND OPEN ROTATOR CUFF REPAIR     right  . TONSILLECTOMY AND ADENOIDECTOMY    . TUBAL LIGATION     Social History   Occupational History  . Not on file  Tobacco Use  . Smoking status: Former Smoker    Packs/day: 1.50    Years: 30.00    Pack years: 45.00    Types: Cigarettes    Last attempt to quit: 10/13/1991    Years since quitting: 26.1  . Smokeless tobacco: Never Used  Substance and Sexual Activity  . Alcohol use: No    Alcohol/week: 0.0 oz  . Drug use: No  . Sexual activity: No

## 2017-12-11 ENCOUNTER — Other Ambulatory Visit: Payer: Self-pay | Admitting: Family Medicine

## 2017-12-14 ENCOUNTER — Ambulatory Visit: Payer: Medicare HMO | Admitting: Pulmonary Disease

## 2017-12-14 ENCOUNTER — Encounter: Payer: Self-pay | Admitting: Pulmonary Disease

## 2017-12-14 VITALS — BP 124/74 | HR 72 | Temp 97.6°F | Ht 65.0 in | Wt 219.4 lb

## 2017-12-14 DIAGNOSIS — M545 Low back pain, unspecified: Secondary | ICD-10-CM

## 2017-12-14 DIAGNOSIS — G8929 Other chronic pain: Secondary | ICD-10-CM

## 2017-12-14 DIAGNOSIS — J208 Acute bronchitis due to other specified organisms: Secondary | ICD-10-CM | POA: Diagnosis not present

## 2017-12-14 DIAGNOSIS — E66812 Obesity, class 2: Secondary | ICD-10-CM

## 2017-12-14 DIAGNOSIS — R600 Localized edema: Secondary | ICD-10-CM

## 2017-12-14 DIAGNOSIS — R609 Edema, unspecified: Secondary | ICD-10-CM | POA: Diagnosis not present

## 2017-12-14 DIAGNOSIS — G4733 Obstructive sleep apnea (adult) (pediatric): Secondary | ICD-10-CM | POA: Diagnosis not present

## 2017-12-14 DIAGNOSIS — I10 Essential (primary) hypertension: Secondary | ICD-10-CM

## 2017-12-14 DIAGNOSIS — J4530 Mild persistent asthma, uncomplicated: Secondary | ICD-10-CM

## 2017-12-14 DIAGNOSIS — J209 Acute bronchitis, unspecified: Secondary | ICD-10-CM

## 2017-12-14 DIAGNOSIS — Z6835 Body mass index (BMI) 35.0-35.9, adult: Secondary | ICD-10-CM

## 2017-12-14 DIAGNOSIS — I872 Venous insufficiency (chronic) (peripheral): Secondary | ICD-10-CM

## 2017-12-14 MED ORDER — BUDESONIDE-FORMOTEROL FUMARATE 160-4.5 MCG/ACT IN AERO: 2.0000 | INHALATION_SPRAY | Freq: Two times a day (BID) | RESPIRATORY_TRACT | 0 refills | Status: DC

## 2017-12-14 MED ORDER — LEVOFLOXACIN 500 MG PO TABS: 500.0000 mg | ORAL_TABLET | Freq: Every day | ORAL | 0 refills | Status: DC

## 2017-12-14 NOTE — Patient Instructions (Signed)
Today we updated your med list in our EPIC system...    Continue your current medications the same...  Continue the 4 times a day "treatments with    DUONEB around breakfast. Lunch. Dinner, & bedtime...    Follow this w/ the BUDESONIDE twice daily & the SYMBICORT twice daily...    Continue the MUCINEX 600mg  4 times daily w/ extra water  You may use the TESSALON [place] 1-2 up to 3 times daily for cough  Today we wrote for an antibiotic-- LEVAQUIN 500mg  one tab daily til gone...  Use an OTC Probiotic like ALIGN daily...  Continue w/ the MEDROL 4mg  tabs as needed (pill in the pocket regimen)...  As the weather gets nicer- tart to gradually incr your exercise program...  Let's plan a follow up visit in 59mo, sooner if needed for problems.Marland KitchenMarland Kitchen

## 2017-12-14 NOTE — Progress Notes (Signed)
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 LEVOTHROID) 100 MCG tablet TAKE 1 TABLET BY MOUTH EVERY DAY BEFORE BREAKFAST  . methylPREDNISolone (MEDROL) 4 MG tablet Take as directed per Dr. Lenna Gilford for wheezing.  . montelukast (SINGULAIR) 10 MG tablet TAKE 1 TABLET(10 MG) BY MOUTH AT BEDTIME  . nystatin (MYCOSTATIN/NYSTOP) powder Apply topically 4 (four) times daily.  Marland Kitchen torsemide (DEMADEX) 20 MG tablet TAKE 2 TABLETS(40 MG) BY MOUTH DAILY (Patient taking differently: 2 every other)  . Vitamin D, Ergocalciferol, (DRISDOL) 50000 units CAPS capsule Take 1 capsule (50,000 Units total) by mouth every 7 (seven) days.  . benzonatate (TESSALON) 100 MG capsule Take 1 capsule (100 mg total) by mouth 3 (three) times daily.   No facility-administered encounter medications on file as of 09/22/2017.      Allergies  Allergen Reactions  . Prednisone Other (See Comments)    Confusion, dizziness Medrol is ok  . Codeine Nausea Only       . Tramadol Itching    Immunization History  Administered Date(s) Administered  . Influenza Split 08/20/2011, 09/30/2012  . Influenza Whole 08/13/2013  . Influenza, High Dose Seasonal PF 10/13/2014, 09/03/2015, 06/03/2017  . Influenza-Unspecified 07/11/2016  . Pneumococcal Conjugate-13 03/16/2014  . Pneumococcal Polysaccharide-23 06/23/2016  . Tdap 09/22/2011  . Zoster 03/16/2014    Current Medications, Allergies, Past Medical History, Past Surgical History, Family History, and Social History were  reviewed in [employer] record.   Review of Systems        See HPI - all other systems neg except as noted... The patient complains of dyspnea on exertion.  The patient denies anorexia, fever, weight loss, weight gain, vision loss, decreased hearing, hoarseness, chest pain, syncope, peripheral edema, prolonged cough, headaches, hemoptysis, abdominal pain, melena, hematochezia, severe indigestion/heartburn, hematuria, incontinence, muscle weakness, suspicious skin lesions, transient blindness, difficulty walking, depression, unusual weight change, abnormal bleeding, enlarged lymph nodes, and angioedema.     Objective:   Physical Exam     WD, Obese, 77 y/o WF in NAD... GENERAL:  Alert & oriented; pleasant & cooperative... HEENT:  [place]/AT, EOM-full, PERRLA, EACs-clear, TMs-wnl, NOSE-clear, THROAT-clear & wnl. NECK:  Supple w/ fairROM; no JVD; normal carotid impulses w/o bruits; no thyromegaly or nodules palpated; no lymphadenopathy. CHEST:  Clear to P & A; without wheezes/ rales/ or rhonchi. HEART:  Regular Rhythm; without murmurs/ rubs/ or gallops. ABDOMEN:  Obese w/ panniculus, soft & nontender; normal bowel sounds; no organomegaly or masses detected. EXT: without deformities, mild arthritic changes; no varicose veins/ +venous insuffic/ tr edema. NEURO:  CN's intact;  no focal neuro deficits... DERM:  No lesions noted; no rash etc...   Assessment &  Plan:    IMP >>     Dyspnea>  This is multifactorial w/ components from AB, pulm restriction due to obesity, deconditioning, and a signif component due to anxiety/stress I believe=> we added KLONOPIN 0.5mg  1/2 Bid...    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    OSA>  Prev sleep study 2008 reviewed & she needs new ResMed S10 machine, supplies=> she loves it 43much improved...    Cardiac issues>   She saw DrHochrein in the past (2008) for HBP, CP, obesity, VI/edema; 2DEcho showed norm LVF, no regional wall motion abn, trivAI & trivMR; Myoview was ordered but ?never done?    Obesity/ IFG>  BMI=36 and she has Hx borderline DM- on diet alone...    Medical issues>  HBP, mixed hyperlipidemia, IFG, Hypothyroid, DJD, Anxiety; on Lisin5, Synthroid100, Xanax0.5.Marland KitchenMarland Kitchen    Ortho/ Neurosurg> she is s/p neck surgeries (DrBotero) and lumbar fusion as well; her orthopedist is Dr. Alphonzo Severance; on Neurontin [phone]...    Anxiety/ stress>  Her daugh was killed in a MVA 2001, she is raising the grand children, her mother passed away in her 45s, she works 9H/d sitting w an elderly pt, etc...   PLAN >>     02/10/16>   We discussed a trial of KLONOPIN 0.5mg Bid along w/ continuing her Symbicort160, Singulair10 & a trial of NEB meds Tid; we also rec a new CPAP set up w/ download in 30d;  I let her ventilate, she is in agreement w/ this plan & very appreciative.    04/07/16>   Problem list as above- we reviewed plans for [facility] but she prefers to use these just prn; she loves the new CPAP machine & is resting well, better energy & daytime alertness, asked to translate this into better diet & EXERCISE program to effect wt reduction; we plan ROV in 48mo...    06/23/16>   She has recurrent cough etc; needs NEB Bid-Tid + Symbicort, Singulair, Mucinex, Fluids, etc...needs DIET & EXERCISE w/ WT REDUCTION!  Take the [facility] more regularly;  Given ZPak for prn use...     03/15/17>   We discussed w/ Jenny [name] to start her NEB rx Qid, continue the Symbicort160-2spBid, add MUCINEX600 Qid, and we will place her on a MEDROL8mg - tapering sched (see AVS);  Finally we reviewed the Low sodium diet & decided to continue the Demadex40 but STOP the HCT12.5 at this point... we plan ROV in 3-4wks.    05/26/17>   Jenny [name] wants to use her extra Medrol 4mg  tabs as needed & says she's been doing this about 2x/wk & it really helps; I have warned her  of the side effects and why I would prefer her to use the DUONEB regularly & add-in Budesonide 0.25 Bid regularly; rec to continue the SymbicortBid Singular daily & the Mucinex600Qid + fluids;  We plan recheck in 6-8wks..    07/21/17>   REC to continue her regular meds as outlined REGULARLY & continue the Medrol4mg  "pill-in-the-pocket per her request;  Needs better diet. 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       12/14/17>   Jenny [name] is reminded to do her DUONEB treatments qid regularly, we will add LEVAQUIN 500mg Qd x7d and Tessalon Perles as needed;  She needs to incr exercise (lim by LBP), she has Medrol4mg  for prn use...  We will plan rov in 3-66mo w/ f/u CXR & any needed labs.   Patient's Medications  New Prescriptions   BUDESONIDE-FORMOTEROL (SYMBICORT) 160-4.5 MCG/ACT INHALER    Inhale 2 puffs into the lungs every 12 (twelve) hours.   LEVOFLOXACIN (LEVAQUIN) 500 MG TABLET    Take 1 tablet (500 mg total) by mouth daily.  Previous Medications   ALBUTEROL (PROVENTIL HFA;VENTOLIN HFA) 108 (90 BASE) MCG/ACT INHALER    Inhale 2 puffs into the lungs every 6 (six) hours as needed. For wheezing   ALPRAZOLAM (XANAX) 0.5 MG TABLET    TAKE 1/2 TO 1 TABLET BY MOUTH THREE TIMES DAILY AS NEEDED FOR ANXIETY   AMLODIPINE (NORVASC) 5 MG TABLET    TAKE 1 TABLET(5 MG) BY MOUTH DAILY   ASPIRIN EC 81 MG TABLET    Take 81 mg by mouth daily.     BUDESONIDE (PULMICORT) 0.25 MG/2ML NEBULIZER SOLUTION    Take 2 mLs (0.25 mg total) by nebulization 2 (two) times daily.   BUDESONIDE-FORMOTEROL  (SYMBICORT) 160-4.5 MCG/ACT INHALER    Inhale 2 puffs into the lungs 2 (two) times daily.   ESCITALOPRAM (LEXAPRO) 5 MG TABLET    TAKE 1 TABLET(5 MG) BY MOUTH DAILY   FUROSEMIDE (LASIX) 20 MG TABLET    Take 20 mg by mouth. Take 2 tablet daily   GABAPENTIN (NEURONTIN) 300 MG CAPSULE    TAKE 1 CAPSULE(300 MG) BY MOUTH THREE TIMES DAILY   GUAIFENESIN (MUCINEX) 600 MG 12 HR TABLET    Take 600 mg by mouth 4 (four) times daily.   IBUPROFEN (ADVIL,MOTRIN) 200 MG TABLET    Take 400 mg by mouth every 6 (six) hours as needed for moderate pain.   IPRATROPIUM-ALBUTEROL (DUONEB) 0.5-2.5 (3) MG/3ML SOLN    Take 3 mLs by nebulization 4 (four) times daily.   LEVOTHYROXINE (SYNTHROID, LEVOTHROID) 100 MCG TABLET    TAKE 1 TABLET BY MOUTH EVERY DAY BEFORE BREAKFAST   METHYLPREDNISOLONE (MEDROL) 4 MG TABLET    Take as directed per Dr. Lenna Gilford for wheezing.   MONTELUKAST (SINGULAIR) 10 MG TABLET    TAKE 1 TABLET(10 MG) BY MOUTH AT BEDTIME  Modified Medications   No medications on file  Discontinued Medications   AZELASTINE (OPTIVAR) 0.05 % OPHTHALMIC SOLUTION    Place 1 drop into both eyes 2 (two) times daily.   CHOLECALCIFEROL (VITAMIN D3) 2000 UNITS TABS    Take 1 tablet by mouth daily.   TORSEMIDE (DEMADEX) 20 MG TABLET    TAKE 2 TABLETS(40 MG) BY MOUTH DAILY

## 2017-12-23 ENCOUNTER — Ambulatory Visit (INDEPENDENT_AMBULATORY_CARE_PROVIDER_SITE_OTHER): Payer: Medicare HMO | Admitting: Physician Assistant

## 2017-12-23 ENCOUNTER — Other Ambulatory Visit: Payer: Self-pay

## 2017-12-23 ENCOUNTER — Encounter: Payer: Self-pay | Admitting: Physician Assistant

## 2017-12-23 VITALS — BP 120/70 | HR 83 | Temp 97.8°F | Resp 16 | Ht 65.0 in | Wt 218.0 lb

## 2017-12-23 DIAGNOSIS — R21 Rash and other nonspecific skin eruption: Secondary | ICD-10-CM

## 2017-12-23 DIAGNOSIS — T148XXA Other injury of unspecified body region, initial encounter: Secondary | ICD-10-CM | POA: Diagnosis not present

## 2017-12-23 MED ORDER — TRIAMCINOLONE ACETONIDE 0.1 % EX CREA: 1.0000 "application " | TOPICAL_CREAM | Freq: Two times a day (BID) | CUTANEOUS | 0 refills | Status: DC

## 2017-12-23 NOTE — Patient Instructions (Signed)
The areas of itch seem like a mild allergic response to something you are coming into contact with.   Please make sure the dog is up to date on flea prevention/treatment.  Keep skin clean and dry. Apply the triamcinolone twice daily to the red patches. Apply a anti-itch lotion like Sarna 1-2 daily as well (interspersed with the prescription cream). Ok to take the Benadryl at night for itch. Symptoms should calm down over the next few days. If not, please call me.   The bruise on the arm is from a ruptured hematoma (blood blister). This is not worrisome. You may note heat in the area due to the pooled blood. This will resolve itself over the next couple of weeks. Ice to the area will help with itch or any tenderness.

## 2017-12-23 NOTE — Progress Notes (Signed)
Patient presents to clinic today c/o itchy spots of skin of L arm and R arm for the past 3 days. Denies change to soaps, lotions or detergents. Denies fever, chills, malaise or fatigue. Denies new foods or change to medications. The area on the left forearm also had a knot under the skin. Patient notes scratching at this area quite a bit, after which the knot went away and a large bruise formed around this area.   Past Medical History:  Diagnosis Date  . ALLERGIC RHINITIS 08/23/2007  . Allergy    rhinitis  . 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 Anemia 09/21/2011  . Annual physical exam 08/06/2013  . Asthma    mild, intermittent  . Asthma 01/14/2011  . Atrophic vaginitis 06/17/2012  . BACK PAIN, LUMBAR 08/23/2007  . Bronchitis   . BRONCHITIS, RECURRENT 02/22/2008  . Cough    usually not productive  . Degenerative joint disease   . DEGENERATIVE JOINT DISEASE 02/22/2008  . Depression with anxiety 03/06/2008   Qualifier: Diagnosis of  By: Lenna Gilford MD, Deborra Medina   . Diverticulosis of colon   . DIVERTICULOSIS OF COLON 02/22/2008  . Dysthymia   . 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  . elevated glucose 08/21/2008  . GERD 08/23/2007  . GERD (gastroesophageal reflux disease)   . HAND PAIN 04/09/2010  . History of chicken pox 01/14/2011  . History of measles 01/14/2011  . Hyperglycemia 07/07/2015  . Hypertension   . HYPERTENSION 02/22/2008  . Hypothyroidism   . HYPOTHYROIDISM 08/23/2007  . Incontinence of urine   . Lumbar back pain   . Obesity   . OBESITY 08/23/2007  . Obstructive sleep apnea    does not use CPAP, lost weight  . Other urinary incontinence 03/12/2010  . Peripheral neuropathy 02/12/2011   possibly related to spondylolisthesis per pt  . PONV (postoperative nausea and vomiting)   . Sleep apnea   . SLEEP APNEA, OBSTRUCTIVE 08/23/2007  . Transient memory loss March 2006   after a stressful event she had no memory the rest of that day  . Urinary frequency   . Urinary incontinence   . UTI (lower  urinary tract infection) 03/23/2012  . Venous insufficiency   . VENOUS INSUFFICIENCY 08/23/2007  . Vitamin D deficiency   . VITAMIN D DEFICIENCY 08/21/2008    Current Outpatient Medications on File Prior to Visit  Medication Sig Dispense Refill  . albuterol (PROVENTIL HFA;VENTOLIN HFA) 108 (90 Base) MCG/ACT inhaler Inhale 2 puffs into the lungs every 6 (six) hours as needed. For wheezing 1 Inhaler 6  . ALPRAZolam (XANAX) 0.5 MG tablet TAKE 1/2 TO 1 TABLET BY MOUTH THREE TIMES DAILY AS NEEDED FOR ANXIETY 90 tablet 5  . amLODipine (NORVASC) 5 MG tablet TAKE 1 TABLET(5 MG) BY MOUTH DAILY 90 tablet 0  . aspirin EC 81 MG tablet Take 81 mg by mouth daily.      . budesonide (PULMICORT) 0.25 MG/2ML nebulizer solution Take 2 mLs (0.25 mg total) by nebulization 2 (two) times daily. 120 mL 11  . budesonide-formoterol (SYMBICORT) 160-4.5 MCG/ACT inhaler Inhale 2 puffs into the lungs 2 (two) times daily. 1 Inhaler 3  . escitalopram (LEXAPRO) 5 MG tablet TAKE 1 TABLET(5 MG) BY MOUTH DAILY 30 tablet 3  . furosemide (LASIX) 20 MG tablet Take 20 mg by mouth. Take 2 tablet daily    . gabapentin (NEURONTIN) 300 MG capsule TAKE 1 CAPSULE(300 MG) BY MOUTH THREE TIMES DAILY 270 capsule 0  . guaiFENesin (MUCINEX) 600 MG  12 hr tablet Take 600 mg by mouth 4 (four) times daily.    Marland Kitchen ibuprofen (ADVIL,MOTRIN) 200 MG tablet Take 400 mg by mouth every 6 (six) hours as needed for moderate pain.    Marland Kitchen ipratropium-albuterol (DUONEB) 0.5-2.5 (3) MG/3ML SOLN Take 3 mLs by nebulization 4 (four) times daily. 360 mL 3  . levothyroxine (SYNTHROID, LEVOTHROID) 100 MCG tablet TAKE 1 TABLET BY MOUTH EVERY DAY BEFORE BREAKFAST 90 tablet 0  . methylPREDNISolone (MEDROL) 4 MG tablet Take as directed per Dr. Lenna Gilford for wheezing. 30 tablet 0  . montelukast (SINGULAIR) 10 MG tablet TAKE 1 TABLET(10 MG) BY MOUTH AT BEDTIME 30 tablet 0  . [DISCONTINUED] esomeprazole (NEXIUM) 40 MG capsule Take 40 mg by mouth daily before breakfast.       No  current facility-administered medications on file prior to visit.     Allergies  Allergen Reactions  . Prednisone Other (See Comments)    Confusion, dizziness Medrol is ok  . Codeine Nausea Only       . Tramadol Itching    Family History  Problem Relation Age of Onset  . COPD Mother   . Pneumonia Mother   . Other Mother        CHF  . Hypertension Mother   . Arthritis Mother        septic knee after replacement  . Heart disease Mother        chf  . Dementia Father   . Arthritis Brother   . Asthma Daughter   . Colon polyps Daughter   . Stroke Maternal Grandmother   . Heart disease Maternal Grandmother   . Other Maternal Grandfather        CHF  . Heart disease Maternal Grandfather        chf  . Cancer Paternal Grandmother        stomach?  . Other Paternal Grandfather        problems with kidneys  . Kidney disease Paternal Grandfather   . Anxiety disorder Daughter   . Cancer Maternal Aunt        breast  . Cancer Maternal Aunt        liver  . Colon cancer Neg Hx     Social History   Socioeconomic History  . Marital status: Widowed    Spouse name: None  . Number of children: None  . Years of education: None  . Highest education level: None  Social Needs  . Financial resource strain: None  . Food insecurity - worry: None  . Food insecurity - inability: None  . Transportation needs - medical: None  . Transportation needs - non-medical: None  Occupational History  . None  Tobacco Use  . Smoking status: Former Smoker    Packs/day: 1.50    Years: 30.00    Pack years: 45.00    Types: Cigarettes    Last attempt to quit: 10/13/1991    Years since quitting: 26.2  . Smokeless tobacco: Never Used  Substance and Sexual Activity  . Alcohol use: No    Alcohol/week: 0.0 oz  . Drug use: No  . Sexual activity: No  Other Topics Concern  . None  Social History Narrative  . None   Review of Systems - See HPI.  All other ROS are negative.  BP 120/70   Pulse 83    Temp 97.8 F (36.6 C) (Oral)   Resp 16   Ht 5\' 5"  (1.651 m)   Wt 218 lb (  98.9 kg)   SpO2 96%   BMI 36.28 kg/m   Physical Exam  Constitutional: She is oriented to person, place, and time and well-developed, well-nourished, and in no distress.  HENT:  Head: Normocephalic and atraumatic.  Neck: Neck supple.  Cardiovascular: Normal rate, regular rhythm, normal heart sounds and intact distal pulses.  Pulmonary/Chest: Effort normal and breath sounds normal. No respiratory distress. She has no wheezes. She has no rales. She exhibits no tenderness.  Neurological: She is alert and oriented to person, place, and time.  Skin: Skin is warm and dry.     Vitals reviewed.  Assessment/Plan: 1. Hematoma Left forearm. Discussed ice and supportive measures. Occurred secondary to excoriation in the area. Reassurance given.  2. Rash and nonspecific skin eruption Concern for bites versus other allergic process. Denies change to soaps, lotions or detergents. Has dog in the home. Start Kenalog BID. Continue antihistamine. Sarna lotion recommended. Follow-up if not improving.  - triamcinolone cream (KENALOG) 0.1 %; Apply 1 application topically 2 (two) times daily.  Dispense: 30 g; Refill: 0   Leeanne Rio, PA-C

## 2017-12-28 ENCOUNTER — Ambulatory Visit: Payer: Self-pay

## 2017-12-28 ENCOUNTER — Telehealth: Payer: Self-pay | Admitting: Family Medicine

## 2017-12-28 NOTE — Telephone Encounter (Signed)
Reported being exposed to flu yesterday, while visiting someone at a long-term care facility.  She assisted her to dress, sat with her at breakfast, and visited with her about 15 min., after returning to her room. Was informed today, that "pt. tested positive for Flu, and has a touch of pneumonia."  Jenny [name] stated she is not having any symptoms at this point. She just wants to be proactive, in preventing coming down with the flu.      Answer Assessment - Initial Assessment Questions 1. TYPE of EXPOSURE: "How were you exposed?" (e.g., close contact, not a close contact)     Assisted pt. to get dressed and sat with pt. while eating breakfast; visited at her bedside an additional 15 minutes.   2. DATE of EXPOSURE: "When did the exposure occur?" (e.g., hour, days, weeks)   exposed 12/27/17; found out today that pt. tested positive for the flu, and a touch of pneumonia  3. PREGNANCY: "Is there any chance you are pregnant?" "When was your last menstrual period?"     No  4. HIGH RISK for COMPLICATIONS: "Do you have any heart or lung problems? Do you have a weakened immune system?" (e.g., CHF, COPD, asthma, HIV positive, chemotherapy, renal failure, diabetes mellitus, sickle cell anemia)     COPD, Hypertension, Thyroid disease 5. SYMPTOMS: "Do you have any symptoms?" (e.g., cough, fever, sore throat, difficulty breathing).     Denied any symptoms at this point  Protocols used: INFLUENZA EXPOSURE-A-AH

## 2017-12-28 NOTE — Telephone Encounter (Signed)
Copied from [place]. Topic: Quick Communication - See Telephone Encounter >> Dec 28, 2017  2:33 PM Burnis Medin, NT wrote: CRM for notification. See Telephone encounter for: Patient called and said she came in contact with someone who tested positive for the flu. Patient wanted to know id the doctor could call her in Tamflu. Pt uses [facility] [place], [place] - [address] [phone] (Phone) [phone] (Fax)    12/28/17.

## 2017-12-28 NOTE — Telephone Encounter (Signed)
Patient will need to be called to discuss her symptoms.

## 2017-12-29 MED ORDER — OSELTAMIVIR PHOSPHATE 75 MG PO CAPS: 75.0000 mg | ORAL_CAPSULE | Freq: Every day | ORAL | 0 refills | Status: DC

## 2017-12-29 NOTE — Telephone Encounter (Signed)
[place] for Tamiflu 75mg  daily x10 days to prevent flu

## 2017-12-29 NOTE — Addendum Note (Signed)
Addended by: [name] Gourd on: 12/29/2017 02:26 PM   Modules accepted: Orders

## 2017-12-29 NOTE — Telephone Encounter (Signed)
tamiflu sent to [facility] in [place]. Will call and inform pt.

## 2017-12-29 NOTE — Addendum Note (Signed)
Addended by: [name] Gourd on: 12/29/2017 09:01 AM   Modules accepted: Orders

## 2017-12-29 NOTE — Telephone Encounter (Signed)
This medication was refilled again today.

## 2017-12-29 NOTE — Telephone Encounter (Signed)
Patient calling regarding prescription of Tamiflu, per nurse triage message today, [facility] in [place] states they have not received script. Please advise, call back to patient at [phone]

## 2017-12-30 ENCOUNTER — Encounter: Payer: Self-pay | Admitting: Physician Assistant

## 2017-12-30 ENCOUNTER — Other Ambulatory Visit: Payer: Self-pay

## 2017-12-30 ENCOUNTER — Ambulatory Visit (INDEPENDENT_AMBULATORY_CARE_PROVIDER_SITE_OTHER): Payer: Medicare HMO | Admitting: Physician Assistant

## 2017-12-30 VITALS — BP 120/86 | HR 76 | Temp 97.7°F | Resp 15 | Ht 65.0 in | Wt 216.2 lb

## 2017-12-30 DIAGNOSIS — R21 Rash and other nonspecific skin eruption: Secondary | ICD-10-CM | POA: Diagnosis not present

## 2017-12-30 LAB — SEDIMENTATION RATE: Sed Rate: 34 mm/h — ABNORMAL HIGH (ref 0–30)

## 2017-12-30 MED ORDER — PERMETHRIN 5 % EX CREA: 1.0000 "application " | TOPICAL_CREAM | Freq: Once | CUTANEOUS | 0 refills | Status: AC

## 2017-12-30 NOTE — Patient Instructions (Addendum)
Please go to the lab today for blood work.  I will call you with your results. We will alter treatment regimen(s) if indicated by your results.   Please apply the Permethrin this evening. Wash off early in the morning.  If there are mites present causing the issue this should get rid of them.   I want you to make sure to take a Zantac each morning and a Benadryl each evening for itch.   I am setting you up with Dermatology for further assessment.

## 2017-12-30 NOTE — Progress Notes (Signed)
Patient presents to clinic today c/o continued pruritus of forearms, neck and upper chest, now with scattered rash. Denies fever, chills, malaise or fatigue. Has not felt bites. Hot water makes the areas itch more and burn. Denies drainage from lesions.    Past Medical History:  Diagnosis Date  . ALLERGIC RHINITIS 08/23/2007  . Allergy    rhinitis  . 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 Anemia 09/21/2011  . Annual physical exam 08/06/2013  . Asthma    mild, intermittent  . Asthma 01/14/2011  . Atrophic vaginitis 06/17/2012  . BACK PAIN, LUMBAR 08/23/2007  . Bronchitis   . BRONCHITIS, RECURRENT 02/22/2008  . Cough    usually not productive  . Degenerative joint disease   . DEGENERATIVE JOINT DISEASE 02/22/2008  . Depression with anxiety 03/06/2008   Qualifier: Diagnosis of  By: Lenna Gilford MD, Deborra Medina   . Diverticulosis of colon   . DIVERTICULOSIS OF COLON 02/22/2008  . Dysthymia   . 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  . elevated glucose 08/21/2008  . GERD 08/23/2007  . GERD (gastroesophageal reflux disease)   . HAND PAIN 04/09/2010  . History of chicken pox 01/14/2011  . History of measles 01/14/2011  . Hyperglycemia 07/07/2015  . Hypertension   . HYPERTENSION 02/22/2008  . Hypothyroidism   . HYPOTHYROIDISM 08/23/2007  . Incontinence of urine   . Lumbar back pain   . Obesity   . OBESITY 08/23/2007  . Obstructive sleep apnea    does not use CPAP, lost weight  . Other urinary incontinence 03/12/2010  . Peripheral neuropathy 02/12/2011   possibly related to spondylolisthesis per pt  . PONV (postoperative nausea and vomiting)   . Sleep apnea   . SLEEP APNEA, OBSTRUCTIVE 08/23/2007  . Transient memory loss March 2006   after a stressful event she had no memory the rest of that day  . Urinary frequency   . Urinary incontinence   . UTI (lower urinary tract infection) 03/23/2012  . Venous insufficiency   . VENOUS INSUFFICIENCY 08/23/2007  . Vitamin D deficiency   . VITAMIN D DEFICIENCY 08/21/2008     Current Outpatient Medications on File Prior to Visit  Medication Sig Dispense Refill  . albuterol (PROVENTIL HFA;VENTOLIN HFA) 108 (90 Base) MCG/ACT inhaler Inhale 2 puffs into the lungs every 6 (six) hours as needed. For wheezing 1 Inhaler 6  . ALPRAZolam (XANAX) 0.5 MG tablet TAKE 1/2 TO 1 TABLET BY MOUTH THREE TIMES DAILY AS NEEDED FOR ANXIETY 90 tablet 5  . amLODipine (NORVASC) 5 MG tablet TAKE 1 TABLET(5 MG) BY MOUTH DAILY 90 tablet 0  . aspirin EC 81 MG tablet Take 81 mg by mouth daily.      . budesonide (PULMICORT) 0.25 MG/2ML nebulizer solution Take 2 mLs (0.25 mg total) by nebulization 2 (two) times daily. 120 mL 11  . budesonide-formoterol (SYMBICORT) 160-4.5 MCG/ACT inhaler Inhale 2 puffs into the lungs 2 (two) times daily. 1 Inhaler 3  . escitalopram (LEXAPRO) 5 MG tablet TAKE 1 TABLET(5 MG) BY MOUTH DAILY 30 tablet 3  . furosemide (LASIX) 20 MG tablet Take 20 mg by mouth. Take 2 tablet daily    . gabapentin (NEURONTIN) 300 MG capsule TAKE 1 CAPSULE(300 MG) BY MOUTH THREE TIMES DAILY 270 capsule 0  . guaiFENesin (MUCINEX) 600 MG 12 hr tablet Take 600 mg by mouth 4 (four) times daily.    Marland Kitchen ibuprofen (ADVIL,MOTRIN) 200 MG tablet Take 400 mg by mouth every 6 (six) hours as needed for moderate pain.    Marland Kitchen  ipratropium-albuterol (DUONEB) 0.5-2.5 (3) MG/3ML SOLN Take 3 mLs by nebulization 4 (four) times daily. 360 mL 3  . levothyroxine (SYNTHROID, LEVOTHROID) 100 MCG tablet TAKE 1 TABLET BY MOUTH EVERY DAY BEFORE BREAKFAST 90 tablet 0  . methylPREDNISolone (MEDROL) 4 MG tablet Take as directed per Dr. Lenna Gilford for wheezing. 30 tablet 0  . montelukast (SINGULAIR) 10 MG tablet TAKE 1 TABLET(10 MG) BY MOUTH AT BEDTIME 30 tablet 0  . oseltamivir (TAMIFLU) 75 MG capsule Take 1 capsule (75 mg total) by mouth daily. 10 capsule 0  . triamcinolone cream (KENALOG) 0.1 % Apply 1 application topically 2 (two) times daily. 30 g 0  . [DISCONTINUED] esomeprazole (NEXIUM) 40 MG capsule Take 40 mg by mouth  daily before breakfast.       No current facility-administered medications on file prior to visit.     Allergies  Allergen Reactions  . Prednisone Other (See Comments)    Confusion, dizziness Medrol is ok  . Codeine Nausea Only       . Tramadol Itching    Family History  Problem Relation Age of Onset  . COPD Mother   . Pneumonia Mother   . Other Mother        CHF  . Hypertension Mother   . Arthritis Mother        septic knee after replacement  . Heart disease Mother        chf  . Dementia Father   . Arthritis Brother   . Asthma Daughter   . Colon polyps Daughter   . Stroke Maternal Grandmother   . Heart disease Maternal Grandmother   . Other Maternal Grandfather        CHF  . Heart disease Maternal Grandfather        chf  . Cancer Paternal Grandmother        stomach?  . Other Paternal Grandfather        problems with kidneys  . Kidney disease Paternal Grandfather   . Anxiety disorder Daughter   . Cancer Maternal Aunt        breast  . Cancer Maternal Aunt        liver  . Colon cancer Neg Hx     Social History   Socioeconomic History  . Marital status: Widowed    Spouse name: Not on file  . Number of children: Not on file  . Years of education: Not on file  . Highest education level: Not on file  Occupational History  . Not on file  Social Needs  . Financial resource strain: Not on file  . Food insecurity:    Worry: Not on file    Inability: Not on file  . Transportation needs:    Medical: Not on file    Non-medical: Not on file  Tobacco Use  . Smoking status: Former Smoker    Packs/day: 1.50    Years: 30.00    Pack years: 45.00    Types: Cigarettes    Last attempt to quit: 10/13/1991    Years since quitting: 26.2  . Smokeless tobacco: Never Used  Substance and Sexual Activity  . Alcohol use: No    Alcohol/week: 0.0 oz  . Drug use: No  . Sexual activity: Never  Lifestyle  . Physical activity:    Days per week: Not on file    Minutes per  session: Not on file  . Stress: Not on file  Relationships  . Social connections:    Talks on  phone: Not on file    Gets together: Not on file    Attends religious service: Not on file    Active member of club or organization: Not on file    Attends meetings of clubs or organizations: Not on file    Relationship status: Not on file  Other Topics Concern  . Not on file  Social History Narrative  . Not on file   Review of Systems - See HPI.  All other ROS are negative.  BP 120/86   Pulse 76   Temp 97.7 F (36.5 C) (Oral)   Resp 15   Ht 5' 5"  (1.651 m)   Wt 216 lb 3.2 oz (98.1 kg)   SpO2 95%   BMI 35.98 kg/m   Physical Exam  Constitutional: She is oriented to person, place, and time and well-developed, well-nourished, and in no distress.  HENT:  Head: Normocephalic and atraumatic.  Eyes: Conjunctivae are normal.  Neck: Neck supple.  Cardiovascular: Normal rate, regular rhythm, normal heart sounds and intact distal pulses.  Pulmonary/Chest: Effort normal and breath sounds normal. No respiratory distress. She has no wheezes. She has no rales. She exhibits no tenderness.  Neurological: She is alert and oriented to person, place, and time.  Psychiatric: Affect normal.  Vitals reviewed.  Assessment/Plan: 1. Rash and nonspecific skin eruption ESR, ANA today. Seems related to bug bites. Start Permethrin x 1. OTC medications reviewed for itch. Continue steroid cream. Referral to Dermatology placed. - Sedimentation rate - permethrin (ELIMITE) 5 % cream; Apply 1 application topically once for 1 dose.  Dispense: 60 g; Refill: 0 - Ambulatory referral to Dermatology - Antinuclear Antib (ANA)   Leeanne Rio, PA-C

## 2018-01-01 LAB — ANA: Anti Nuclear Antibody(ANA): NEGATIVE

## 2018-01-03 ENCOUNTER — Other Ambulatory Visit: Payer: Self-pay | Admitting: Emergency Medicine

## 2018-01-03 DIAGNOSIS — R21 Rash and other nonspecific skin eruption: Secondary | ICD-10-CM

## 2018-01-04 ENCOUNTER — Telehealth: Payer: Self-pay | Admitting: *Deleted

## 2018-01-04 NOTE — Telephone Encounter (Signed)
Referral was placed on 12/30/17 - routing to admin staff to see if they can check the status of referral and call patient with update.   Copied from [place] [phone]. Topic: Referral - Request >> Jan 04, 2018  9:36 AM Arletha Grippe wrote: Reason for CRM:  Pt would like to have referral for rash to dermatologist.  Pt says that it is getting worse. Please call [phone]

## 2018-01-06 ENCOUNTER — Other Ambulatory Visit: Payer: Self-pay | Admitting: Pulmonary Disease

## 2018-01-13 ENCOUNTER — Other Ambulatory Visit: Payer: Medicare HMO

## 2018-01-13 DIAGNOSIS — L509 Urticaria, unspecified: Secondary | ICD-10-CM | POA: Diagnosis not present

## 2018-01-14 ENCOUNTER — Other Ambulatory Visit (INDEPENDENT_AMBULATORY_CARE_PROVIDER_SITE_OTHER): Payer: Medicare HMO

## 2018-01-14 DIAGNOSIS — R21 Rash and other nonspecific skin eruption: Secondary | ICD-10-CM

## 2018-01-14 LAB — SEDIMENTATION RATE: Sed Rate: 17 mm/hr (ref 0–30)

## 2018-02-11 ENCOUNTER — Other Ambulatory Visit: Payer: Self-pay | Admitting: Family Medicine

## 2018-02-16 ENCOUNTER — Other Ambulatory Visit: Payer: Self-pay | Admitting: Family Medicine

## 2018-02-21 ENCOUNTER — Other Ambulatory Visit: Payer: Self-pay | Admitting: Pulmonary Disease

## 2018-02-23 ENCOUNTER — Telehealth: Payer: Self-pay | Admitting: Pulmonary Disease

## 2018-02-23 MED ORDER — ALPRAZOLAM 0.5 MG PO TABS: ORAL_TABLET | ORAL | 5 refills | Status: DC

## 2018-02-23 NOTE — Telephone Encounter (Signed)
Per SN- okay to refill Xanax 0.5 #90 with 5 refills.  Rx has been phoned in to [facility] in [place].  Pt is aware and voiced her understanding. Nothing further is needed.

## 2018-02-23 NOTE — Telephone Encounter (Signed)
Called and spoke to pt.  Pt is requesting refill on Xanax 0.5. Last refilled 08/23/17 #90 with 5 refill.  Preferred pharmacy is [facility].   SN please advise. Thanks  Current Outpatient Medications on File Prior to Visit  Medication Sig Dispense Refill  . albuterol (PROVENTIL HFA;VENTOLIN HFA) 108 (90 Base) MCG/ACT inhaler Inhale 2 puffs into the lungs every 6 (six) hours as needed. For wheezing 1 Inhaler 6  . ALPRAZolam (XANAX) 0.5 MG tablet TAKE 1/2 TO 1 TABLET BY MOUTH THREE TIMES DAILY AS NEEDED FOR ANXIETY 90 tablet 5  . amLODipine (NORVASC) 5 MG tablet TAKE 1 TABLET(5 MG) BY MOUTH DAILY 90 tablet 0  . aspirin EC 81 MG tablet Take 81 mg by mouth daily.      . budesonide (PULMICORT) 0.25 MG/2ML nebulizer solution Take 2 mLs (0.25 mg total) by nebulization 2 (two) times daily. 120 mL 11  . budesonide-formoterol (SYMBICORT) 160-4.5 MCG/ACT inhaler Inhale 2 puffs into the lungs 2 (two) times daily. 1 Inhaler 3  . escitalopram (LEXAPRO) 5 MG tablet TAKE 1 TABLET(5 MG) BY MOUTH DAILY 30 tablet 3  . furosemide (LASIX) 20 MG tablet Take 20 mg by mouth. Take 2 tablet daily    . gabapentin (NEURONTIN) 300 MG capsule TAKE 1 CAPSULE(300 MG) BY MOUTH THREE TIMES DAILY 270 capsule 0  . guaiFENesin (MUCINEX) 600 MG 12 hr tablet Take 600 mg by mouth 4 (four) times daily.    Marland Kitchen ibuprofen (ADVIL,MOTRIN) 200 MG tablet Take 400 mg by mouth every 6 (six) hours as needed for moderate pain.    Marland Kitchen ipratropium-albuterol (DUONEB) 0.5-2.5 (3) MG/3ML SOLN Take 3 mLs by nebulization 4 (four) times daily. 360 mL 3  . levothyroxine (SYNTHROID, LEVOTHROID) 100 MCG tablet TAKE 1 TABLET BY MOUTH EVERY DAY BEFORE BREAKFAST 90 tablet 0  . methylPREDNISolone (MEDROL) 4 MG tablet Take as directed per Dr. Lenna Gilford for wheezing. 30 tablet 0  . methylPREDNISolone (MEDROL) 4 MG tablet TAKE AS DIRECTED PER DR- NADEL FOR WHEEZING 30 tablet 0  . montelukast (SINGULAIR) 10 MG tablet TAKE 1 TABLET(10 MG) BY MOUTH AT BEDTIME 30  tablet 0  . oseltamivir (TAMIFLU) 75 MG capsule Take 1 capsule (75 mg total) by mouth daily. 10 capsule 0  . triamcinolone cream (KENALOG) 0.1 % Apply 1 application topically 2 (two) times daily. 30 g 0  . [DISCONTINUED] esomeprazole (NEXIUM) 40 MG capsule Take 40 mg by mouth daily before breakfast.       No current facility-administered medications on file prior to visit.     Allergies  Allergen Reactions  . Prednisone Other (See Comments)    Confusion, dizziness Medrol is ok  . Codeine Nausea Only       . Tramadol Itching

## 2018-03-11 ENCOUNTER — Other Ambulatory Visit: Payer: Self-pay | Admitting: Family Medicine

## 2018-03-17 ENCOUNTER — Other Ambulatory Visit: Payer: Self-pay | Admitting: Family Medicine

## 2018-03-31 ENCOUNTER — Telehealth: Payer: Self-pay | Admitting: Family Medicine

## 2018-03-31 ENCOUNTER — Other Ambulatory Visit: Payer: Self-pay | Admitting: Pulmonary Disease

## 2018-03-31 NOTE — Telephone Encounter (Signed)
Per Jess through skype from [facility] wanted form to be giving to Amy to take to pt.

## 2018-03-31 NOTE — Telephone Encounter (Signed)
Paperwork given to Amy to give to patient

## 2018-03-31 NOTE — Telephone Encounter (Signed)
Paperwork given to PCP for completion.  

## 2018-03-31 NOTE — Telephone Encounter (Signed)
Form completed and placed in basket  

## 2018-03-31 NOTE — Telephone Encounter (Signed)
Pt came in with paperwork to receive a handicap placard. Pt requesting the form be filled out. Placed in front bin with charge sheet.

## 2018-04-21 ENCOUNTER — Ambulatory Visit: Payer: Medicare HMO | Admitting: Pulmonary Disease

## 2018-04-21 ENCOUNTER — Encounter: Payer: Self-pay | Admitting: Pulmonary Disease

## 2018-04-21 ENCOUNTER — Ambulatory Visit (INDEPENDENT_AMBULATORY_CARE_PROVIDER_SITE_OTHER)
Admission: RE | Admit: 2018-04-21 | Discharge: 2018-04-21 | Disposition: A | Discharge status: Home / Self Care | Payer: Medicare HMO | Source: Ambulatory Visit | Attending: Pulmonary Disease | Admitting: Pulmonary Disease

## 2018-04-21 VITALS — BP 124/66 | HR 73 | Temp 97.7°F | Ht 65.0 in | Wt 210.6 lb

## 2018-04-21 DIAGNOSIS — I10 Essential (primary) hypertension: Secondary | ICD-10-CM

## 2018-04-21 DIAGNOSIS — R600 Localized edema: Secondary | ICD-10-CM

## 2018-04-21 DIAGNOSIS — Z6835 Body mass index (BMI) 35.0-35.9, adult: Secondary | ICD-10-CM

## 2018-04-21 DIAGNOSIS — J4520 Mild intermittent asthma, uncomplicated: Secondary | ICD-10-CM | POA: Diagnosis not present

## 2018-04-21 DIAGNOSIS — G4733 Obstructive sleep apnea (adult) (pediatric): Secondary | ICD-10-CM

## 2018-04-21 DIAGNOSIS — I872 Venous insufficiency (chronic) (peripheral): Secondary | ICD-10-CM

## 2018-04-21 DIAGNOSIS — G8929 Other chronic pain: Secondary | ICD-10-CM

## 2018-04-21 DIAGNOSIS — M545 Low back pain: Secondary | ICD-10-CM

## 2018-04-21 DIAGNOSIS — R609 Edema, unspecified: Secondary | ICD-10-CM

## 2018-04-21 DIAGNOSIS — J45909 Unspecified asthma, uncomplicated: Secondary | ICD-10-CM | POA: Diagnosis not present

## 2018-04-21 DIAGNOSIS — E66812 Obesity, class 2: Secondary | ICD-10-CM

## 2018-04-21 MED ORDER — PANTOPRAZOLE SODIUM 40 MG PO TBEC: DELAYED_RELEASE_TABLET | ORAL | 3 refills | Status: DC

## 2018-04-21 NOTE — Patient Instructions (Addendum)
Today we updated your med list in our EPIC system...    Continue your current medications the same...  Today we checked a spirometry breathing test and a follow up CXR...    We will contact you w/ the results when available...   Continue your NEBULIZER w/ Duoneb 3-4 times daily... Continue the BUDESONIDE 0.25 via Nebulizer twice daily... Continue the SYMBICORT160-  2 inhalations twice daily Continue the Dupree 600mg  one tab 4 times daily w/ fluids...  We are instituting an ANTIREFLUS regimen >>    Start the PROTONIX (Pantoprazole) 40mg  taken ~37min before the evening meal...    Do NOT eat or drink anything after dinner in the eve    Elevate the head of your bed 6 inches  Keep up the good work w/ diet & exercise    Work on losing 20 lbs!!!  Call for any questions...  Let's plan a follow up visit in 38mo, sooner if needed for problems...   REMEMBER TO AVOID SMOKE & STRONG ODORS

## 2018-04-23 ENCOUNTER — Encounter: Payer: Self-pay | Admitting: Pulmonary Disease

## 2018-04-23 NOTE — Progress Notes (Signed)
Subjective:    Patient ID: ANNESSA SATRE, female    DOB: Nov 18, 1940, 77 y.o.   MRN: [id number]  HPI 77 y/o WF whom I last saw in 2011-01-20  Self-referred for a pulmonary evaluation due to dyspnea>>   ~  Mar 11, 2011:  61yr ROV & follow up for her AR, AB, & OSA> she lives in [place] & is now seeing DrBlythe for Primary Care (see prob list below & labs done 4/12);  She notes that her mother, [name], passed away 2022-11-07- tearful;  Her CC is tired, fatigue, not resting well, wakes tired;  As noted she has known OSA- see below, but only using CPAP intermittently- she thinks hard to sleep due to pain in her hands (complex prob involving DrDeveshwar, DrGramig- prev CTS surg but no better after this, etc)... She has +daytime hypersomnolence & Epworth Sleepiness score of 16>  We reviewed her labs from April & did f/u CXR today- NAD... Improvement will be dependent on diet, exercise, wt reduction, better CPAP compliance, & resolution of her hand dilemma...  ~  Feb 10, 2016:  44yr ROV>  I remember Toia & her mother ([name]- passed away in 2011/01/20 in her 15s w/ mult medical issues);  As noted below [name] has a Hx of AR/ AB, OSA on CPAP, Obesity, and mult medical issues including:  HBP, hx CP, Hypothyroid, GERD, Divertics, hx urinary incont, DJD, LBP, VitD defic & Dysthymia... She has been seeing Dr. Penni Homans for her Primary Care needs & generally stable but requested this appt due to "recurrent asthma" symptoms w/ incr intermittent SOB, wheezing, cough w/ beige sput; she's noted DOE w/ walking x yrs and progressing to ADLs like yard work, housework, even making the beds but she has been very sedentary & not exercising;  She denies CP, palpit, edema... [name] admits to me that she is under a lot of stress- grand children 26 & 18 are under her responsibility since her daugh was killed in a MVA in 20-Jan-2000...    [name] is an ex-smoker-- starting around 59, smoked for 30+yrs up to 1.5ppd & quit in 01/20/96;   She has a hx of asthma on SYMBICORT160-2spBid, Singulair10, & AlbutHFA rescue inhaler as needed;  She is obese and has a hx of OSA on CPAP Qhs via [facility]; she had a PSG 01-20-07 w/ AHI=29/H & worse in REM, desat to 62%, no arrhythmias, rare leg jerks, mod snoring; she was titrated to CPAP 12 & saw DrClance at the time...    [name] has had a lot of Ortho/ NS problems w/ neck pain=> C5-6 fusion followed by decompression & fusion C3-4 & C4-5 [id number] by DrBotero;  Then she had lumbar surg w/ L4 to S1 fusion Nov2012 foolowed by rehab... EXAM shows Afeb, VSS, O2sat=97% on RA at rest;  Wt=214#, 5'5"Tall, BMI=36;  HEENT- neg, mallampati2;  Chest- clear x sl dry cough;  Heart- RR w/o m/r/g;  Abd- obese w/ panniculus, soft, nontender;  Ext- VI, no edema;  Neuro- intact...  CXR 02/10/16>  Norm heart size, right cardiophrenic angle blunting likely due to a prom epicardial fat pad, clear lungs, surg clips in upper abd and lower Tspine fixation- NAD...  Spirometry 02/10/16>  FVC=1.88 (66%), FEV1=1.47 (70%), %1sec=78, mid-flows are wnl at 81% predicted;  This is c/w mild-mod restriction w/o evid for obstructive dis...   Ambulatory oximetry on RA 02/10/16>  O2sat=97% on RA at rest w/ pulse=69/min;  She ambulated 3 Laps (185' each) in our  office w/ lowest O2sat=95% w/ pulse=95/min; no desaturations...  Last LABS in [facility] 10/2015>  Chems- ok x BS=124, A1c=5.8;  CBC- wnl;  TSH=2.28... IMP>>      Dyspnea>  This is multifactorial w/ components from AB, pulm restriction due to obesity, deconditioning, and a signif component due to anxiety/stress I believe...    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    OSA>  Prev sleep study 2008 reviewed & she needs new ResMed S10 machine, supplies, then download...    Cardiac issues>  She saw DrHochrein in the past (2008) for HBP, CP, obesity, VI/edema; 2DEcho showed norm LVF, no regional wall motion  abn, trivAI & trivMR; Myoview was ordered but ?never done?    Obesity/ IFG>  BMI=36 and she has Hx borderline DM- on diet alone...    Medical issues>  HBP, mixed hyperlipidemia, IFG, Hypothyroid, DJD, Anxiety; on Lisin5, Synthroid100, Xanax0.5.Marland KitchenMarland Kitchen    Ortho/ Neurosurg> she is s/p neck surgeries (DrBotero) and lumbar fusion as well; her orthopedist is Dr. Alphonzo Severance; on Neurontin [phone]...    Anxiety/ stress>  Her daugh was killed in a MVA 2001, she is raising the grand children, her mother passed away in her 28s, she works 9H/d sitting w an elderly pt, etc...  PLAN>>      We discussed a trial of KLONOPIN 0.5mg Bid along w/ continuing her Symbicort160, Singulair10 & a trial of NEB meds Tid; we also rec a new CPAP set up w/ download in 30d;  I let her ventilate, she is in agreement w/ this plan & very appreciative... we plan ROV ~6wks.  ~  April 07, 2016:  6wk ROV and [name] reports that she is much improved & credits mostly the new CPAP machine- she loves it, now resting well Qhs & DOWNLOAD shows 7H/night, Auto5-15, using nasal pillows w/ sl leak & AHI<1 "I've never slept this well" & wakes refreshed, no daytime hypersom issues etc... As far as our other modalities of treatment for her condition> she is not using the [place] regularly, not using the [facility] regularly but notes that both meds help when she uses them PRN... Unfortunately she has not taken to heart our discussion regarding DIET & EXERCISE => weight reduction (her weight is up 6# & she is still "sitting" 9H/d w/ an elderly pt... EXAM shows Afeb, VSS, O2sat=97% on RA at rest;  Wt=220#, 5'5"Tall, BMI=37;  HEENT- neg, mallampati2;  Chest- clear w/o w/r/r;  Heart- RR w/o m/r/g;  Abd- obese w/ panniculus, soft, nontender;  Ext- VI, no edema;  Neuro- intact... IMP/PLAN>>  Problem list as above- we reviewed plans for [place] Rx but she prefers to use these just prn; she loves the new CPAP machine & is resting well, better energy & daytime  alertness, asked to translate this into better diet & EXERCISE program to effect wt reduction; we plan ROV in 10mo...   ~  June 23, 2016:  49mo ROV & Skie presents w/ a recurrent cough- deep, dry w/o sput or hemoptysis, some wheezing, & seems worse at night & in the AM;  She denies heartburn, reflux, etc;  She remains SOB "like I can't get a deep breath", denies CP/ palpit/ f/c/s, etc;  Sleeping well w/ her new CPAP machine, but she is still NOT exercising, ?DIET, wt unchanged at 220#, BMI=36-37... SEE PROB LIST ABOVE > she saw DrBlyth 05/2016 c/o LBP & constip, prev seen by  NS-Botero & Ortho-Dean;  BP/ Thyroid/ DM/ HL were all felt to be stable- continue same meds...     EXAM shows Afeb, VSS, O2sat=97% on RA;  Wt=220#, 5'5"Tall, BMI=37;  HEENT- neg, mallampati2;  Chest- clear w/o w/r/r;  Heart- RR w/o m/r/g;  Abd- obese w/ panniculus, soft, nontender;  Ext- VI, no edema;  Neuro- intact... IMP/PLAN>>  Oliva needs more regular dosing w/ her breathing meds=> REC DuonebBid regularly & up to Qid prn, Symbicort160=-2spBid, Singulair10;  Add-in MUCINEX 600-2Bid, Fluids, & Delsym prn;  She needs a vigorous low carb low fat diet & incr exercise program of effect weight reduction;  Continue CPAP nightly;  Needs KLONOPIN 05- take 1/2 tab Bid;  Given Pneumovax-23 today...   ~  March 15, 2017:  10mo ROV & add-on appt requested for increased SOB>  Hoyle Sauer says "I'm having a problem w/ fluid"; BP was elev & meds adjusted by PCP- on amlod5 & HCT12.5, ultimately Demadex 20 was added for edema, then increased to 40mg /d;  She is c/o incr SOB x1-2wks she says, wakes at night for a NEB rx, notes wheezing, dry cough w/o sput, denies f/c/s, mild chest discomfort from the cough; we reviewed the following medical problems during today's office visit >>     She saw PCP-DrTabori 12/24/16 w/ HBP> on Amlod5 & HCT12.5 + Lasix20 prn swelling; good control w/ BP=120/80    She saw her PCP-CodyMartinPA on 02/16/17 w/ peripheral edema>  she was already on HCT 12.5mg  & Demadex20 added (later incr to 2/d) & reminded about a low salt diet;  Labs showed HCO3=31, BS=110, Cr=0.79, BNP=47;      Dyspnea>  This is multifactorial w/ components from AB, pulm restriction due to obesity, deconditioning, and a signif component due to anxiety/stress I believe...    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    OSA>  Prev sleep study 2008 reviewed & she received a new ResMed S10 machine in 2017 & loves it, now resting well Qhs & DOWNLOAD 6/17 showed 7H/night, Auto5-15, using nasal pillows w/ sl leak & AHI<1 "I've never slept this well" & wakes refreshed, no daytime hypersom issues etc    Cardiac issues>  She saw DrHochrein in the past (2008) for HBP, CP, obesity, VI/edema; 2DEcho showed norm LVF, no regional wall motion abn, trivAI & trivMR; Myoview was ordered but ?never done?    Obesity/ IFG>  BMI=35 and she has Hx borderline DM- on diet alone...    Medical issues>  HBP, mixed hyperlipidemia, IFG, Hypothyroid, DJD, Anxiety; on Amlod5, HCT12.5, Demadex40, Synthroid100, Neurontin300Tid, Xanax0.5 taking 1-2/d (never took the Klonopin)...    Ortho/ Neurosurg> she is s/p neck surgeries (DrBotero) and lumbar fusion as well; her orthopedist is Dr. Alphonzo Severance; on Neurontin & they did extensive Lumbar Myelogram 10/2016....    Anxiety/ stress>  Her daugh was killed in a MVA 2001, she is raising the grand children, her mother passed away in her 79s, she works 9H/d sitting w an elderly pt, etc...     EXAM shows Afeb, VSS, O2sat=94% on RA;  Wt=215#, 5'5"Tall, BMI=35;  HEENT- neg, mallampati2;  Chest- clear w/o w/r/r;  Heart- RR w/o m/r/g;  Abd- obese w/ panniculus, soft, nontender;  Ext- VI, no edema;  Neuro- intact...  EKG 02/19/17 showed NSR, rate 75, LAD, otherw wnl/ NAD...  2DEcho 03/10/17 => resched & done 03/16/17> norm LVF w/  EF=60-65%, no regional wall motion abn, norm LV diastolic parameters, norm AoV, norm MV w/ calcif annulus, norm LA&RA, norm PA pressure.  CXR 03/15/17 (independently reviewed by me in the PACS system) shows norm heart size, right heart border epicard fat pad, no adenopathy, clear lungs- NAD, postop changes in Cspine...   LABS 03/15/17>  Chems- HCO3=35, BS=125, Cr=1.12;  BNP=35,  Sed=22...  IMP/PLAN>>  We discussed w/ Hoyle Sauer to start her NEB rx Qid, continue the Symbicort160-2spBid, add MUCINEX600 Qid, and we will place her on a MEDROL8mg - tapering sched (see AVS);  Finally we reviewed the Low sodium diet & decided to continue the Demadex40 but STOP the HCT12.5 at this point... we plan ROV in 3-4wks.  ~  May 26, 2017:  51mo ROV & add-on appt requested for "breathing issues":>  She is c/o leg cramps and swelling NOT going down w/ her Demadex rx (20mg Bid);  Despite the fact that her Spirometry is mostly restricted and we've discussed mult times how she needs to lose wt to help this, she was asked to use her [place] (but she is only doing it Bid on some days), on Symbicort160-2spBid, Singulair10, Mucinex600Qid w/ fluids; she notes tha MEDROL really helps and she wants to keep some on hand for "prn" use "pill in the pocket" & says she averages 2/wk & says it works well-- I stressed to her the neg side effects of this med & would prefer to have her use the [place] regualrly & add BUDESONIDE Bid as well...     Dyspnea>  This is multifactorial w/ components from AB, pulm restriction due to obesity, deconditioning, and a signif component due to anxiety/stress I believe...    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    OSA>  Prev sleep study 2008 reviewed & she received a new ResMed S10 machine in 2017 & loves it, now resting well Qhs & DOWNLOAD 6/17 showed 7H/night, Auto5-15, using nasal  pillows w/ sl leak & AHI<1 "I've never slept this well" & wakes refreshed, no daytime hypersom issues etc    Obesity/ IFG>  Weight is up to 220#, BMI=35 and she has Hx borderline DM- on diet alone...    EXAM shows Afeb, VSS, O2sat=97% on RA; Wt=220#, 5'6"Tall, BMI=35;  HEENT- neg, mallampati2;  Chest- clear w/o w/r/r;  Heart- RR w/o m/r/g;  Abd- obese w/ panniculus, soft, nontender;  Ext- VI, no edema;  Neuro- intact... IMP/PLAN>>  Gussie wants to use her extra Medrol 4mg  tabs as needed & says she's been doing this about 2x/wk & it really helps; I have warned her of the side effects and why I would prefer her to use the DUONEB regularly & add-in Budesonide 0.25 Bid regularly; rec to continue the SymbicortBid, Singular daily & the Mucinex600Qid + fluids;  We plan recheck in 6-8wks...  ~  July 21, 2017:  92mo ROV & pulmonary follow up visit> Abigaelle reports that she has been more regular w/ her pulm regimen of Duoneb Bid (plus extra Duoneb prn), Budesonide Bid, Symbicort160-2spBid, Singulair10/d, Mucinex500Qid w/ fluids; she has used 1 Medrol4mg  tab about every other week (4 tabs in the last 6mo) & this is improved from her prev dependence on this med;  She reports extra stress due to a close friend w/ metastatic breast cancer who is terminal- on Xanax 0.5mg  1/2 to 1 tab Tid as needed (she reports  that Pharm won't let her have Klonopin because she is on the Xanax)...  She reports good & bad days> some cough, min sput, +congestion w/ "tightness" and wheezing- she notes that 2nd hand smoke is really tough for her and perfumes/ strong odors too...    She saw PCP-DrTabori for annual exam 8/23/128>  Note reviewed, HBP, hypothy, VitD defic, anxiety/depression, etc...    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 EXAM shows Afeb, VSS, O2sat=96% on RA; Wt=218#, 5'6"Tall, BMI=35;  HEENT-  neg, mallampati2;  Chest- clear w/o w/r/r;  Heart- RR w/o m/r/g;  Abd- obese w/ panniculus, soft, nontender;  Ext- VI, no edema;  Neuro- intact...  Mammogram 9/18> NEG, and she is relieved  BMD 9/18>  wnl w/ lowest Tscore in Left FemNeck -1.0  LABS in Epic 06/2017>  Chems- wnl;  CBC- wnl & 5% eos (400), TSH=3.29... IMP/PLAN>>  REC to continue her regular meds as outlined REGULARLY & continue the Medrol4mg  "pill-in-the-pocket" per her request;  Needs better diet. Exercise, wt reduction;  Try the Xanax 0.5mg  1/2 to 1 tab Tid more regularly...   ~  September 22, 2017:  63mo ROV & Karstyn called 12/7 c/o cough, yellow sput, incr chest congestion, wheezing x2d; we called in [place] w/ min improvement so far- she wants more "Medrol"; she denies f/c/s but still feels "tight"/ congested/ wheezing; activities & exercise are lim breathing & fatigue;  She has DUONEB (using Bid-Tid), Budes for NEB Bid, Symbicort160-2spBid, Singulair10, Mucinex600-take 1-2/d... We discussed increasing these meds to max advantage...  We reviewed the following medical problems during today's office visit >>     Dyspnea>  This is multifactorial w/ components from AB, pulm restriction due to obesity, deconditioning, and a signif component due to anxiety/stress I believe...    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    OSA>  Prev sleep study 2008 reviewed & she received a new ResMed S10 machine in 2017 & loves it, now resting well Qhs & DOWNLOAD 6/17 showed 7H/night, Auto5-15, using nasal pillows w/ sl leak & AHI<1 "I've never slept this well" & wakes refreshed, no daytime hypersom issues etc    Obesity/ IFG>  Weight is +- stable at 215#, BMI=35 and she has Hx borderline DM- on diet alone... EXAM shows Afeb, VSS, O2sat=94% on RA; Wt=215#, 5'6"Tall, BMI=34-5;  HEENT- neg, mallampati2;   Chest- clear w/o w/r/r;  Heart- RR w/o m/r/g;  Abd- obese w/ panniculus, soft, nontender;  Ext- VI, no edema;  Neuro- intact... IMP/PLAN>>  Amaura has hx refractory AB & is "dependent" on Medrol=> we discussed increasing DUONEB to Qid, continue Budes Bid, continue Symbicort160-2spBid, Singulair10, incr Mucinex600Qid w/ fluids; we refilled her MEDROL 4mg  tabs to continue her "pill in the pocket" regimen that she likes & advised to minimize the steroid use; we reviewed diet/ exercise/ wt reduction strategies...   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  We reviewed the following interval medical notes avail in Epic-EMR>      She saw PCP office- WMartin,PA on 10/28/17>  2d hx itchy watery eyes, & she was felt to have allergic conjunctivitis- topical rx discussed + Astelin drops...    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  We reviewed the following medical problems during today's office visit>    Dyspnea>  This is multifactorial w/ components from AB, pulm restriction due to obesity, deconditioning, and a signif component due to anxiety/stress I believe...    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    OSA>  Prev sleep study 2008 reviewed & she received a new ResMed S10 machine in 2017 & loves  it, now resting well Qhs & DOWNLOAD 6/17 showed 7H/night, Auto5-15, using nasal pillows w/ sl leak & AHI<1 "I've never slept this well" & wakes refreshed, no daytime hypersom issues etc    Obesity/ IFG>  Weight is +- stable at 215#, BMI=35 and she has Hx borderline DM- on diet alone... EXAM shows Afeb, VSS, O2sat=94% on RA; Wt=215#, 5'6"Tall, BMI=34-5;  HEENT- neg, mallampati2;  Chest- clear w/o w/r/r;  Heart- RR w/o m/r/g;  Abd- obese w/ panniculus, soft, nontender;  Ext- VI, no edema;  Neuro- intact... IMP/PLAN>>  Carleta is reminded to do her DUONEB treatments qid regularly, we will add LEVAQUIN 500mg Qd x7d and Tessalon Perles as needed;  She needs to incr exercise (lim by LBP), she has Medrol4mg  for prn use...  We will plan rov in 3-67mo w/ f/u CXR & any needed labs...   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     We reviewed the following interval medical notes avail in Epic-EMR>      She saw several physicians w/ rash=> DERM- DrDJones>  Mina tells me this was eventually proven to be BED BUGS (at the lady's house she sits for) and she is improved w/ Rx there & at her own place as well... We reviewed the following medical problems during today's office visit>    Dyspnea>  This is multifactorial w/ components from AB, pulm restriction due to obesity, deconditioning, and a signif component due to anxiety/stress I believe...    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 ...    OSA>  Prev sleep study 2008 reviewed & she received a new ResMed S10 machine in 2017 & loves it, now resting well Qhs & DOWNLOAD 6/17 showed 7H/night, Auto5-15, using nasal pillows w/  sl leak & AHI<1 "I've never slept this well" & wakes refreshed, no daytime hypersom issues etc    Obesity/ IFG>  Weight is +- stable at 215# range, BMI=35 and she has Hx borderline DM- on diet alone... EXAM shows Afeb, VSS, O2sat=94% on RA; Wt=210#, 5'6"Tall, BMI=34;  HEENT- neg, mallampati2;  Chest- clear w/o w/r/r;  Heart- RR w/o m/r/g;  Abd- obese w/ panniculus, soft, nontender;  Ext- VI, no edema;  Neuro- intact...  CXR 04/21/18 (independently reviewed by me in the PACS system) shows norm heart size, clear lungs- NAD, sl pleural thickening noted...   SPIROMETRY 04/21/18>  FVC=1.60 (58%), FEV1=1.33 (64%), %1sec=83%, mid-flows wnl at 99% predicted; THIS IS RESTRICTED, not obstructed; we reviewed Rx for diet, exercise weight reduction... IMP/PLAN>>  Journee's CXR is clear & Spirometry shows a restrictive ventilatory defect (not obstructive) indicating that she could lighten up on her Neb rx but rec continuing the Symbicort for the AB component, advise minimizing & elim the Medrol use, AND get on the diet/ exercise/ wt reduction program!!!  In addition we are initiating a vigorous antireflux regimen=> PROTONIX 40mg  taken 30 min before dinner, NPO after dinner, elevate HOB 6" on blocks... She notes that she has to avoid smoking, strong odors, & other exposures...     Problem List:  ALLERGIC RHINITIS (ICD-477.9) - on antihistamine OTC and Saline nasal mist as needed... eval and Rx per DrESL...  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 she is currently taking SYMBICORT 160 daily; + MUCINEX, PROAIR, & TESSALON prn... she is off her prev Advair and Singulair... ~  She had an infectious exac treated  by [facility] 4/12...  SLEEP APNEA, OBSTRUCTIVE (ICD-327.23) - abnormal sleep study 4/08 showing an AHI of 29, and desat to 62%... started on CPAP and seen by DrClance... optimized to 12cm pressure... 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 As a result she notes increasing daytime hypersomnolence (ESS=16), not resting well, wakes tired, etc...  HYPERTENSION (ICD-401.9) - on LASIX 40mg /d & K20/d... BP= 122/82, & weight is stable 225#... she knows the critical importance of losing weight in her BP control...  Hx of CHEST PAIN (ICD-786.50) - on ASA 81mg /d... s/p cardiac eval 6/08 by DrHochrein... she had a neg 2D & Cardiolite. ~  2DEcho 7/08 showed trivial AI, no wall motion abn, norm LVF w/ EF= 55-60%... ~  NuclearStressTest 7/08 showed no ischemia or infarction, EF= 79%...  VENOUS INSUFFICIENCY (ICD-459.81) - she follows a low sodium diet, etc...  DIABETES MELLITUS, BORDERLINE (ICD-790.29) - FBS= 130-160 this year... discussed diet + exercise and the need to get her weight down. ~  labs 5/09 showed BS= 129, A1c= 6.4 ~  labs 11/09 showed BS= 118, A1c= 6.8 ~  labs 6/10 showed BS= 100, A1c= 6.0 ~  labs 6/11 showed BS= 90, A1c= 6.1  OBESITY (ICD-278.00) - weight = 225# >> essentially w/o change over several yrs... we have reviewed diet + exercise requirements to get her weight down...  ~  labs reviewed and her FLP is actually normal despite her obesity> TChol 146, TG 125, HDL 39, LDL 82  HYPOTHYROIDISM (ICD-244.9) - on SYNTHROID 150mg /d but she has hx of intermittent use & has been encouraged to take it daily! ~  labs 3/08 showed TSH= 50... she was off medication, restart 135mcg/d. ~  labs 4/08 showed TSH= 0.79... continue same dose. ~  labs 11/09 showed  TSH= 18.13... pt not taking Levothy regularly! ~  labs 6/10 showed TSH= 0.22... continue regular dosing. ~  labs 6/11 showed TSH= 0.15... we could decr to 141mcg/d but asymptomatic  & would rather see her lose some wt 1st.  GERD (ICD-530.81) - on PEPCID OTC...  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 she has a hx of hems in the past... she saw DrNat-Mann in 2006- colon done 10/06 showing divertics, hems, very tortuous colon...  OTHER URINARY INCONTINENCE (ICD-788.39) - she had bladder tack & sling 2004 by DrTannenbaum... doing satis since then & no recurrent infections...  DEGENERATIVE JOINT DISEASE CARPAL TUNNEL SYNDROME - she notes Carpel Tunnel & wrist arthritis w/ surg 2010 by DrGramig but persistant symptoms post op... he tried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

## 2018-05-13 ENCOUNTER — Other Ambulatory Visit: Payer: Self-pay | Admitting: Family Medicine

## 2018-05-13 NOTE — Telephone Encounter (Signed)
Last OV 12/30/17, No future OV  Last filled 02/23/18 by Dr. Teressa Lower, MD, #[id number] with 5 refills

## 2018-05-16 ENCOUNTER — Other Ambulatory Visit: Payer: Self-pay | Admitting: Pulmonary Disease

## 2018-05-28 ENCOUNTER — Other Ambulatory Visit: Payer: Self-pay | Admitting: Family Medicine

## 2018-07-05 DIAGNOSIS — S129XXA Fracture of neck, unspecified, initial encounter: Secondary | ICD-10-CM | POA: Diagnosis not present

## 2018-07-05 DIAGNOSIS — G9589 Other specified diseases of spinal cord: Secondary | ICD-10-CM | POA: Diagnosis not present

## 2018-07-05 DIAGNOSIS — I10 Essential (primary) hypertension: Secondary | ICD-10-CM | POA: Diagnosis not present

## 2018-07-05 DIAGNOSIS — M5030 Other cervical disc degeneration, unspecified cervical region: Secondary | ICD-10-CM | POA: Diagnosis not present

## 2018-07-05 DIAGNOSIS — Z6834 Body mass index (BMI) 34.0-34.9, adult: Secondary | ICD-10-CM | POA: Diagnosis not present

## 2018-07-05 DIAGNOSIS — M4712 Other spondylosis with myelopathy, cervical region: Secondary | ICD-10-CM | POA: Diagnosis not present

## 2018-07-05 DIAGNOSIS — M542 Cervicalgia: Secondary | ICD-10-CM | POA: Diagnosis not present

## 2018-07-05 DIAGNOSIS — M4722 Other spondylosis with radiculopathy, cervical region: Secondary | ICD-10-CM | POA: Diagnosis not present

## 2018-07-05 NOTE — Progress Notes (Signed)
@Patient  ID: Jenny [name], female    DOB: [birth year], 77 y.o.   MRN: [id number]  Chief Complaint  Patient presents with  . Acute Visit    increased sob     Referring provider: Midge Minium, MD  HPI:  77 year old female followed in our office by Dr. Lenna Gilford for dyspnea, restrictive pattern on office spirometry, obstructive sleep apnea (managed on CPAP)  PMH: GERD Smoker/ Smoking History: Former smoker.  Quit 1993.  45 pack years. Maintenance: Symbicort 160 Pt of: Dr. Lenna Gilford   07/06/2018  - Visit   77 year old female patient presenting today for acute visit.  Patient feels that over the past couple weeks her shortness of breath is increased as well as she is had a continued dry cough.  Patient reports she was last treated with antibiotics about 3 to 4 months ago.  She has Medrol 4 mg tablets that Dr. Lenna Gilford as prescribed to her.  She reports she had to take a few of those this weekend because her shortness of breath and gotten so severe.  MMRC - Breathlessness Score 3 - I stop for breath after walking about 100 yards or after a few minutes on level ground (isle at grocery store is 110ft)    Tests:   04/21/2018-chest x-ray- lungs are clear, no active cardiopulmonary disease  04/21/2018-spirometry- FVC 1.6 (58% predicted), ratio 83%, FEV1 64% >>>moderately severe restriction  03/16/2017-echocardiogram-LV ejection fraction 60 to 65%  Chart Review:     Specialty Problems      Pulmonary Problems   Allergic rhinitis    Qualifier: Diagnosis of  By: Julien Girt CMA, Leigh        Obstructive sleep apnea    Qualifier: Diagnosis of  By: Julien Girt CMA, Leigh  cpap      Chronic bronchitis ([place])    Qualifier: History of  By: Lenna Gilford MD, Deborra Medina       Asthma    Mild, intermittent      COPD mixed type ([place])   Cough      Allergies  Allergen Reactions  . Prednisone Other (See Comments)    Confusion, dizziness Medrol is ok  . Codeine Nausea Only       . Tramadol  Itching    Immunization History  Administered Date(s) Administered  . Influenza Split 08/20/2011, 09/30/2012  . Influenza Whole 08/13/2013  . Influenza, High Dose Seasonal PF 10/13/2014, 09/03/2015, 06/03/2017  . Influenza-Unspecified 07/11/2016  . Pneumococcal Conjugate-13 03/16/2014  . Pneumococcal Polysaccharide-23 06/23/2016  . Tdap 09/22/2011  . Zoster 03/16/2014    Past Medical History:  Diagnosis Date  . ALLERGIC RHINITIS 08/23/2007  . Allergy    rhinitis  . 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 Anemia 09/21/2011  . Annual physical exam 08/06/2013  . Asthma    mild, intermittent  . Asthma 01/14/2011  . Atrophic vaginitis 06/17/2012  . BACK PAIN, LUMBAR 08/23/2007  . Bronchitis   . BRONCHITIS, RECURRENT 02/22/2008  . Cough    usually not productive  . Degenerative joint disease   . DEGENERATIVE JOINT DISEASE 02/22/2008  . Depression with anxiety 03/06/2008   Qualifier: Diagnosis of  By: Lenna Gilford MD, Deborra Medina   . Diverticulosis of colon   . DIVERTICULOSIS OF COLON 02/22/2008  . Dysthymia   . 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  . elevated glucose 08/21/2008  . GERD 08/23/2007  . GERD (gastroesophageal reflux disease)   . HAND PAIN 04/09/2010  . History of chicken pox 01/14/2011  . History of measles 01/14/2011  .  Hyperglycemia 07/07/2015  . Hypertension   . HYPERTENSION 02/22/2008  . Hypothyroidism   . HYPOTHYROIDISM 08/23/2007  . Incontinence of urine   . Lumbar back pain   . Obesity   . OBESITY 08/23/2007  . Obstructive sleep apnea    does not use CPAP, lost weight  . Other urinary incontinence 03/12/2010  . Peripheral neuropathy 02/12/2011   possibly related to spondylolisthesis per pt  . PONV (postoperative nausea and vomiting)   . Sleep apnea   . SLEEP APNEA, OBSTRUCTIVE 08/23/2007  . Transient memory loss March 2006   after a stressful event she had no memory the rest of that day  . Urinary frequency   . Urinary incontinence   . UTI (lower urinary tract infection) 03/23/2012  .  Venous insufficiency   . VENOUS INSUFFICIENCY 08/23/2007  . Vitamin D deficiency   . VITAMIN D DEFICIENCY 08/21/2008    Tobacco History: Social History   Tobacco Use  Smoking Status Former Smoker  . Packs/day: 1.50  . Years: 30.00  . Pack years: 45.00  . Types: Cigarettes  . Last attempt to quit: 10/13/1991  . Years since quitting: 26.7  Smokeless Tobacco Never Used   Counseling given: Yes  Continue not smoking  Outpatient Encounter Medications as of 07/06/2018  Medication Sig  . albuterol (PROVENTIL HFA;VENTOLIN HFA) 108 (90 Base) MCG/ACT inhaler Inhale 2 puffs into the lungs every 6 (six) hours as needed. For wheezing  . ALPRAZolam (XANAX) 0.5 MG tablet TAKE 1/2 TO 1 TABLET BY MOUTH THREE TIMES DAILY AS NEEDED FOR ANXIETY  . amLODipine (NORVASC) 5 MG tablet TAKE 1 TABLET(5 MG) BY MOUTH DAILY  . aspirin EC 81 MG tablet Take 81 mg by mouth daily.    . budesonide (PULMICORT) 0.25 MG/2ML nebulizer solution Take 2 mLs (0.25 mg total) by nebulization 2 (two) times daily.  . budesonide-formoterol (SYMBICORT) 160-4.5 MCG/ACT inhaler Inhale 2 puffs into the lungs 2 (two) times daily.  Marland Kitchen escitalopram (LEXAPRO) 5 MG tablet TAKE 1 TABLET(5 MG) BY MOUTH DAILY  . furosemide (LASIX) 20 MG tablet Take 20 mg by mouth. Take 2 tablet daily as needed  . guaiFENesin (MUCINEX) 600 MG 12 hr tablet Take 600 mg by mouth 4 (four) times daily.  Marland Kitchen ibuprofen (ADVIL,MOTRIN) 200 MG tablet Take 400 mg by mouth every 6 (six) hours as needed for moderate pain.  Marland Kitchen ipratropium-albuterol (DUONEB) 0.5-2.5 (3) MG/3ML SOLN USE 3 ML VIA NEBULIZER FOUR TIMES DAILY  . levothyroxine (SYNTHROID, LEVOTHROID) 100 MCG tablet TAKE 1 TABLET BY MOUTH EVERY DAY BEFORE BREAKFAST  . methylPREDNISolone (MEDROL) 4 MG tablet Take as directed per Dr. Lenna Gilford for wheezing.  . montelukast (SINGULAIR) 10 MG tablet TAKE 1 TABLET(10 MG) BY MOUTH AT BEDTIME  . pantoprazole (PROTONIX) 40 MG tablet 1 daily 30 min before dinner  . [DISCONTINUED]  methylPREDNISolone (MEDROL) 4 MG tablet Take as directed per Dr. Lenna Gilford for wheezing.  Marland Kitchen azithromycin (ZITHROMAX) 250 MG tablet 500mg  (two tablets) today, then 250mg  (1 tablet) for the next 4 days  . benzonatate (TESSALON) 200 MG capsule Take 1 capsule (200 mg total) by mouth 3 (three) times daily as needed for cough.  . budesonide-formoterol (SYMBICORT) 160-4.5 MCG/ACT inhaler Inhale 2 puffs into the lungs 2 (two) times daily.  . famotidine (PEPCID) 20 MG tablet Take 1 tablet (20 mg total) by mouth at bedtime.  . [DISCONTINUED] esomeprazole (NEXIUM) 40 MG capsule Take 40 mg by mouth daily before breakfast.    . [EXPIRED] methylPREDNISolone acetate (DEPO-MEDROL)  injection 80 mg    No facility-administered encounter medications on file as of 07/06/2018.      Review of Systems  Review of Systems  Constitutional: Positive for fatigue. Negative for chills, fever and unexpected weight change.  HENT: Negative for congestion, ear pain, postnasal drip, sinus pressure and sinus pain.   Respiratory: Positive for cough (dry cough ) and shortness of breath. Negative for chest tightness and wheezing.   Cardiovascular: Negative for chest pain and palpitations.  Gastrointestinal: Negative for blood in stool, diarrhea, nausea and vomiting.       Denies indigestion  Genitourinary: Negative for dysuria, frequency and urgency.  Musculoskeletal: Negative for arthralgias.  Skin: Negative for color change.  Allergic/Immunologic: Positive for environmental allergies. Negative for food allergies.  Neurological: Negative for dizziness, light-headedness and headaches.  Psychiatric/Behavioral: Negative for dysphoric mood. The patient is not nervous/anxious.   All other systems reviewed and are negative.    Physical Exam  BP 136/84   Pulse 72   Temp 97.7 F (36.5 C) (Oral)   Ht 5\' 5"  (1.651 m)   Wt 207 lb 3.2 oz (94 kg)   SpO2 97%   BMI 34.48 kg/m   Wt Readings from Last 5 Encounters:  07/06/18 207 lb  3.2 oz (94 kg)  04/21/18 210 lb 9.6 oz (95.5 kg)  12/30/17 216 lb 3.2 oz (98.1 kg)  12/23/17 218 lb (98.9 kg)  12/14/17 219 lb 6.4 oz (99.5 kg)     Physical Exam  Constitutional: She is oriented to person, place, and time and well-developed, well-nourished, and in no distress. Vital signs are normal. No distress.  HENT:  Head: Normocephalic and atraumatic.  Right Ear: Hearing, tympanic membrane, external ear and ear canal normal.  Left Ear: Hearing, tympanic membrane, external ear and ear canal normal.  Nose: Nose normal. Right sinus exhibits no maxillary sinus tenderness and no frontal sinus tenderness. Left sinus exhibits no maxillary sinus tenderness and no frontal sinus tenderness.  Mouth/Throat: Uvula is midline and oropharynx is clear and moist. No oropharyngeal exudate.  Eyes: Pupils are equal, round, and reactive to light.  Neck: Normal range of motion. Neck supple. No JVD present.  Cardiovascular: Normal rate, regular rhythm and normal heart sounds.  Pulmonary/Chest: Effort normal. No accessory muscle usage. No respiratory distress. She has no decreased breath sounds. She has wheezes (Expiratory). She has no rhonchi.  Abdominal: Soft. Bowel sounds are normal. There is no tenderness.  Musculoskeletal: Normal range of motion. She exhibits no edema.  Lymphadenopathy:    She has no cervical adenopathy.  Neurological: She is alert and oriented to person, place, and time. Gait normal.  Skin: Skin is warm and dry. She is not diaphoretic. No erythema.  Psychiatric: Mood, memory, affect and judgment normal.  Nursing note and vitals reviewed.     Lab Results:  CBC    Component Value Date/Time   WBC 7.7 06/15/2017 1008   RBC 4.34 06/15/2017 1008   HGB 13.0 06/15/2017 1008   HCT 40.3 06/15/2017 1008   PLT 300.0 06/15/2017 1008   MCV 92.9 06/15/2017 1008   MCH 28.9 02/15/2015 1617   MCHC 32.1 06/15/2017 1008   RDW 14.5 06/15/2017 1008   LYMPHSABS 2.4 06/15/2017 1008    MONOABS 0.8 06/15/2017 1008   EOSABS 0.4 06/15/2017 1008   BASOSABS 0.1 06/15/2017 1008    BMET    Component Value Date/Time   NA 143 06/15/2017 1008   K 4.4 06/15/2017 1008   CL 104 06/15/2017  1008   CO2 30 06/15/2017 1008   GLUCOSE 104 (H) 06/15/2017 1008   BUN 15 06/15/2017 1008   CREATININE 0.94 06/15/2017 1008   CREATININE 1.00 (H) 12/17/2016 1521   CALCIUM 9.5 06/15/2017 1008   GFRNONAA 56 (L) 08/27/2014 1923   GFRAA 65 (L) 08/27/2014 1923    BNP No results found for: BNP  ProBNP    Component Value Date/Time   PROBNP 35.0 03/15/2017 1113    Imaging: Dg Chest 2 View  Result Date: 07/06/2018 CLINICAL DATA:  Hypertension, COPD, former smoker, asthma, bronchitis EXAM: CHEST - 2 VIEW COMPARISON:  04/21/2018 FINDINGS: Normal heart size and pulmonary vascularity. Prominent density at the RIGHT cardiophrenic angle likely represents a prominent epicardial fat pad and is unchanged since an earlier study of 02/10/2016. Mediastinal contours otherwise normal. Lungs hyperinflated but clear. No infiltrate, pleural effusion or pneumothorax. Prior cervical spine fusion. IMPRESSION: Mild hyperinflation without acute infiltrate. Electronically Signed   By: Lavonia Dana M.D.   On: 07/06/2018 11:49      Assessment & Plan:   Pleasant 77 year old patient seen for acute visit today.  Patient with COPD exacerbation.  Will treat with Z-Pak, Depo-Medrol injection, as well as Medrol taper.  Discussed allergies with patient and patient will monitor for symptoms.  Patient is taking Medrol in the past and has done well.  Will also be more aggressive with GERD management.  Also educated patient on GERD diet.  Patient to now take Protonix in the morning, will add Pepcid in the evening.   Patient to follow-up in 2 to 4 weeks if symptoms are not improving.  COPD mixed type ([facility]) Azithromycin 250mg  tablet  >>>Take 2 tablets (500mg  total) today, and then 1 tablet (250mg ) for the next four days    >>>take with food  >>>can also take probiotic and / or yogurt while on antibiotic   Chest x-ray today  Medrol 4mg  tablet  >>> take 4 tablets daily for 3 days, take 3 tablets daily for 3 days, take 2 tablets daily for 3 days, take 1 tablet daily for 3 days then stop >>> Take with food in the morning  Continue Symbicort 160 >>> 2 puffs in the morning right when you wake up, rinse out your mouth after use, 12 hours later 2 puffs, rinse after use >>> Take this daily, no matter what >>> This is not a rescue inhaler   Tessalon Perles prescription provided today >>>Can take 1 200 mg Perles every 8 hours as needed for cough  Follow-up with our office in 2 to 4 weeks to ensure symptoms are resolving and that you are improving  Obstructive sleep apnea We recommend that you continue using your CPAP daily >>>Keep up the hard work using your device >>> Goal should be wearing this for the entire night that you are sleeping, at least 4 to 6 hours  Remember:  . Do not drive or operate heavy machinery if tired or drowsy.  . Please notify the supply company and office if you are unable to use your device regularly due to missing supplies or machine being broken.  . Work on maintaining a healthy weight and following your recommended nutrition plan  . Maintain proper daily exercise and movement  . Maintaining proper use of your device can also help improve management of other chronic illnesses such as: Blood pressure, blood sugars, and weight management.   BiPAP/ CPAP Cleaning:  >>>Clean weekly, with Dawn soap, and bottle brush.  Set up to air  dry.    GERD Start Protonix 40 mg in the morning on empty stomach 30 minutes to an hour before other medications or eating  Start Pepcid 20 mg at night >>>Review GERD literature listed below   Follow-up with our office in 2 to 4 weeks to ensure symptoms are resolving and that you are improving  This appointment was 26 minutes along with her 50% that  time direct face-to-face patient care, assessment, plan of care follow-up.  I have discussed this case with Dr. Lenna Gilford.  He agrees with the plan of care listed above.   Lauraine Rinne, NP 07/06/2018

## 2018-07-06 ENCOUNTER — Ambulatory Visit: Payer: Medicare HMO | Admitting: Pulmonary Disease

## 2018-07-06 ENCOUNTER — Encounter: Payer: Self-pay | Admitting: Pulmonary Disease

## 2018-07-06 ENCOUNTER — Ambulatory Visit (INDEPENDENT_AMBULATORY_CARE_PROVIDER_SITE_OTHER)
Admission: RE | Admit: 2018-07-06 | Discharge: 2018-07-06 | Disposition: A | Discharge status: Home / Self Care | Payer: Medicare HMO | Source: Ambulatory Visit | Attending: Pulmonary Disease | Admitting: Pulmonary Disease

## 2018-07-06 VITALS — BP 136/84 | HR 72 | Temp 97.7°F | Ht 65.0 in | Wt 207.2 lb

## 2018-07-06 DIAGNOSIS — J449 Chronic obstructive pulmonary disease, unspecified: Secondary | ICD-10-CM

## 2018-07-06 DIAGNOSIS — G4733 Obstructive sleep apnea (adult) (pediatric): Secondary | ICD-10-CM | POA: Diagnosis not present

## 2018-07-06 DIAGNOSIS — I10 Essential (primary) hypertension: Secondary | ICD-10-CM | POA: Diagnosis not present

## 2018-07-06 DIAGNOSIS — R05 Cough: Secondary | ICD-10-CM

## 2018-07-06 DIAGNOSIS — J309 Allergic rhinitis, unspecified: Secondary | ICD-10-CM | POA: Diagnosis not present

## 2018-07-06 DIAGNOSIS — K219 Gastro-esophageal reflux disease without esophagitis: Secondary | ICD-10-CM

## 2018-07-06 DIAGNOSIS — R059 Cough, unspecified: Secondary | ICD-10-CM | POA: Insufficient documentation

## 2018-07-06 MED ORDER — FAMOTIDINE 20 MG PO TABS: 20.0000 mg | ORAL_TABLET | Freq: Every day | ORAL | 0 refills | Status: DC

## 2018-07-06 MED ORDER — BUDESONIDE-FORMOTEROL FUMARATE 160-4.5 MCG/ACT IN AERO: 2.0000 | INHALATION_SPRAY | Freq: Two times a day (BID) | RESPIRATORY_TRACT | 0 refills | Status: DC

## 2018-07-06 MED ORDER — METHYLPREDNISOLONE 4 MG PO TABS: ORAL_TABLET | ORAL | 0 refills | Status: DC

## 2018-07-06 MED ORDER — BENZONATATE 200 MG PO CAPS: 200.0000 mg | ORAL_CAPSULE | Freq: Three times a day (TID) | ORAL | 1 refills | Status: DC | PRN

## 2018-07-06 MED ORDER — METHYLPREDNISOLONE ACETATE 80 MG/ML IJ SUSP
80.0000 mg | Freq: Once | INTRAMUSCULAR | Status: AC
  Administered 2018-07-06: 80 mg via INTRAMUSCULAR

## 2018-07-06 MED ORDER — AZITHROMYCIN 250 MG PO TABS: ORAL_TABLET | ORAL | 0 refills | Status: DC

## 2018-07-06 NOTE — Progress Notes (Signed)
Your chest x-ray results of come back.  Showing no acute changes.  No plan of care changes at this time.  Keep follow-up appointment.    Follow-up with our office if symptoms worsen or you do not feel like you are improving under her current regimen.  It was a pleasure taking care of you,  Brian Mack, FNP 

## 2018-07-06 NOTE — Patient Instructions (Addendum)
Azithromycin 250mg  tablet  >>>Take 2 tablets (500mg  total) today, and then 1 tablet (250mg ) for the next four days  >>>take with food  >>>can also take probiotic and / or yogurt while on antibiotic   Chest x-ray today  Medrol 4mg  tablet  >>> take 4 tablets daily for 3 days, take 3 tablets daily for 3 days, take 2 tablets daily for 3 days, take 1 tablet daily for 3 days then stop >>> Take with food in the morning  Continue Symbicort 160 >>> 2 puffs in the morning right when you wake up, rinse out your mouth after use, 12 hours later 2 puffs, rinse after use >>> Take this daily, no matter what >>> This is not a rescue inhaler   Start Protonix 40 mg in the morning on empty stomach 30 minutes to an hour before other medications or eating  Start Pepcid 20 mg at night >>>Review GERD literature listed below  Tessalon Perles prescription provided today >>>Can take 1 200 mg Perle every 8 hours as needed for cough    Follow-up with our office in 2 to 4 weeks to ensure symptoms are resolving and that you are improving   It is flu season:   >>>Remember to be washing your hands regularly, using hand sanitizer, be careful to use around herself with has contact with people who are sick will increase her chances of getting sick yourself. >>> Best ways to protect herself from the flu: Receive the yearly flu vaccine, practice good hand hygiene washing with soap and also using hand sanitizer when available, eat a nutritious meals, get adequate rest, hydrate appropriately    As of 08/15/2018 we will be moving! We will no longer be at our [place] location.   Our new address and phone number will be:  [address] [place], [place] [address] Telephone number: [phone]   Please contact the office if your symptoms worsen or you have concerns that you are not improving.   Thank you for choosing [facility] for your healthcare, and for allowing [place] to partner with you on your  healthcare journey. I am thankful to be able to provide care to you today.   Wyn Quaker FNP-C        Gastroesophageal Reflux Disease, Adult Normally, food travels down the esophagus and stays in the stomach to be digested. If a person has gastroesophageal reflux disease (GERD), food and stomach acid move back up into the esophagus. When this happens, the esophagus becomes sore and swollen (inflamed). Over time, GERD can make small holes (ulcers) in the lining of the esophagus. Follow these instructions at home: Diet  Follow a diet as told by your doctor. You may need to avoid foods and drinks such as: ? Coffee and tea (with or without caffeine). ? Drinks that contain alcohol. ? Energy drinks and sports drinks. ? Carbonated drinks or sodas. ? Chocolate and cocoa. ? Peppermint and mint flavorings. ? Garlic and onions. ? Horseradish. ? Spicy and acidic foods, such as peppers, chili powder, curry powder, vinegar, hot sauces, and BBQ sauce. ? Citrus fruit juices and citrus fruits, such as oranges, lemons, and limes. ? Tomato-based foods, such as red sauce, chili, salsa, and pizza with red sauce. ? Fried and fatty foods, such as donuts, french fries, potato chips, and high-fat dressings. ? High-fat meats, such as hot dogs, rib eye steak, sausage, ham, and bacon. ? High-fat dairy items, such as whole milk, butter, and cream cheese.  Eat small meals often. Avoid eating large meals.  Avoid drinking large amounts of liquid with your meals.  Avoid eating meals during the 2-3 hours before bedtime.  Avoid lying down right after you eat.  Do not exercise right after you eat. General instructions  Pay attention to any changes in your symptoms.  Take over-the-counter and prescription medicines only as told by your doctor. Do not take aspirin, ibuprofen, or other NSAIDs unless your doctor says it is okay.  Do not use any tobacco products, including cigarettes, chewing tobacco, and  e-cigarettes. If you need help quitting, ask your doctor.  Wear loose clothes. Do not wear anything tight around your waist.  Raise (elevate) the head of your bed about 6 inches (15 cm).  Try to lower your stress. If you need help doing this, ask your doctor.  If you are overweight, lose an amount of weight that is healthy for you. Ask your doctor about a safe weight loss goal.  Keep all follow-up visits as told by your doctor. This is important. Contact a doctor if:  You have new symptoms.  You lose weight and you do not know why it is happening.  You have trouble swallowing, or it hurts to swallow.  You have wheezing or a cough that keeps happening.  Your symptoms do not get better with treatment.  You have a hoarse voice. Get help right away if:  You have pain in your arms, neck, jaw, teeth, or back.  You feel sweaty, dizzy, or light-headed.  You have chest pain or shortness of breath.  You throw up (vomit) and your throw up looks like blood or coffee grounds.  You pass out (faint).  Your poop (stool) is bloody or black.  You cannot swallow, drink, or eat. This information is not intended to replace advice given to you by your health care provider. Make sure you discuss any questions you have with your health care provider. Document Released: 03/16/2008 Document Revised: 03/05/2016 Document Reviewed: 01/23/2015 Elsevier Interactive Patient Education  2018 [place] for Gastroesophageal Reflux Disease, Adult When you have gastroesophageal reflux disease (GERD), the foods you eat and your eating habits are very important. Choosing the right foods can help ease your discomfort. What guidelines do I need to follow?  Choose fruits, vegetables, whole grains, and low-fat dairy products.  Choose low-fat meat, fish, and poultry.  Limit fats such as oils, salad dressings, butter, nuts, and avocado.  Keep a food diary. This helps you  identify foods that cause symptoms.  Avoid foods that cause symptoms. These may be different for everyone.  Eat small meals often instead of 3 large meals a day.  Eat your meals slowly, in a place where you are relaxed.  Limit fried foods.  Cook foods using methods other than frying.  Avoid drinking alcohol.  Avoid drinking large amounts of liquids with your meals.  Avoid bending over or lying down until 2-3 hours after eating. What foods are not recommended? These are some foods and drinks that may make your symptoms worse: Vegetables Tomatoes. Tomato juice. Tomato and spaghetti sauce. Chili peppers. Onion and garlic. Horseradish. Fruits Oranges, grapefruit, and lemon (fruit and juice). Meats High-fat meats, fish, and poultry. This includes hot dogs, ribs, ham, sausage, salami, and bacon. Dairy Whole milk and chocolate milk. Sour cream. Cream. Butter. Ice cream. Cream cheese. Drinks Coffee and tea. Bubbly (carbonated) drinks or energy drinks. Condiments Hot  sauce. Barbecue sauce. Sweets/Desserts Chocolate and cocoa. Donuts. Peppermint and spearmint. Fats and Oils High-fat foods. This includes [place] fries and potato chips. Other Vinegar. Strong spices. This includes black pepper, white pepper, red pepper, cayenne, curry powder, cloves, ginger, and chili powder. The items listed above may not be a complete list of foods and drinks to avoid. Contact your dietitian for more information. This information is not intended to replace advice given to you by your health care provider. Make sure you discuss any questions you have with your health care provider. Document Released: 03/29/2012 Document Revised: 03/05/2016 Document Reviewed: 08/02/2013 Elsevier Interactive Patient Education  2017 [employer].

## 2018-07-06 NOTE — Assessment & Plan Note (Signed)
Azithromycin 250mg  tablet  >>>Take 2 tablets (500mg  total) today, and then 1 tablet (250mg ) for the next four days  >>>take with food  >>>can also take probiotic and / or yogurt while on antibiotic   Chest x-ray today  Medrol 4mg  tablet  >>> take 4 tablets daily for 3 days, take 3 tablets daily for 3 days, take 2 tablets daily for 3 days, take 1 tablet daily for 3 days then stop >>> Take with food in the morning  Continue Symbicort 160 >>> 2 puffs in the morning right when you wake up, rinse out your mouth after use, 12 hours later 2 puffs, rinse after use >>> Take this daily, no matter what >>> This is not a rescue inhaler   Tessalon Perles prescription provided today >>>Can take 1 200 mg Perles every 8 hours as needed for cough  Follow-up with our office in 2 to 4 weeks to ensure symptoms are resolving and that you are improving

## 2018-07-06 NOTE — Assessment & Plan Note (Signed)

## 2018-07-06 NOTE — Assessment & Plan Note (Signed)
Start Protonix 40 mg in the morning on empty stomach 30 minutes to an hour before other medications or eating  Start Pepcid 20 mg at night >>>Review GERD literature listed below   Follow-up with our office in 2 to 4 weeks to ensure symptoms are resolving and that you are improving

## 2018-08-20 ENCOUNTER — Other Ambulatory Visit: Payer: Self-pay | Admitting: Pulmonary Disease

## 2018-08-23 ENCOUNTER — Encounter: Payer: Self-pay | Admitting: Pulmonary Disease

## 2018-08-23 ENCOUNTER — Ambulatory Visit: Payer: Medicare HMO | Admitting: Pulmonary Disease

## 2018-08-23 ENCOUNTER — Ambulatory Visit (INDEPENDENT_AMBULATORY_CARE_PROVIDER_SITE_OTHER): Payer: Medicare HMO

## 2018-08-23 ENCOUNTER — Other Ambulatory Visit: Payer: Self-pay | Admitting: Pulmonary Disease

## 2018-08-23 VITALS — BP 132/66 | HR 89 | Temp 98.0°F | Ht 65.0 in | Wt 207.6 lb

## 2018-08-23 DIAGNOSIS — R600 Localized edema: Secondary | ICD-10-CM

## 2018-08-23 DIAGNOSIS — R05 Cough: Secondary | ICD-10-CM

## 2018-08-23 DIAGNOSIS — I872 Venous insufficiency (chronic) (peripheral): Secondary | ICD-10-CM

## 2018-08-23 DIAGNOSIS — J4530 Mild persistent asthma, uncomplicated: Secondary | ICD-10-CM

## 2018-08-23 DIAGNOSIS — I10 Essential (primary) hypertension: Secondary | ICD-10-CM

## 2018-08-23 DIAGNOSIS — R059 Cough, unspecified: Secondary | ICD-10-CM

## 2018-08-23 DIAGNOSIS — E66812 Obesity, class 2: Secondary | ICD-10-CM

## 2018-08-23 DIAGNOSIS — J449 Chronic obstructive pulmonary disease, unspecified: Secondary | ICD-10-CM

## 2018-08-23 DIAGNOSIS — G4733 Obstructive sleep apnea (adult) (pediatric): Secondary | ICD-10-CM

## 2018-08-23 DIAGNOSIS — R609 Edema, unspecified: Secondary | ICD-10-CM | POA: Diagnosis not present

## 2018-08-23 MED ORDER — AZITHROMYCIN 250 MG PO TABS: ORAL_TABLET | ORAL | 0 refills | Status: DC

## 2018-08-23 MED ORDER — METHYLPREDNISOLONE ACETATE 80 MG/ML IJ SUSP
120.0000 mg | Freq: Once | INTRAMUSCULAR | Status: AC
  Administered 2018-08-23: 120 mg via INTRAMUSCULAR

## 2018-08-23 MED ORDER — METHYLPREDNISOLONE 4 MG PO TBPK: ORAL_TABLET | ORAL | 0 refills | Status: DC

## 2018-08-23 MED ORDER — BUDESONIDE-FORMOTEROL FUMARATE 160-4.5 MCG/ACT IN AERO: 2.0000 | INHALATION_SPRAY | Freq: Two times a day (BID) | RESPIRATORY_TRACT | 0 refills | Status: DC

## 2018-08-23 NOTE — Progress Notes (Addendum)
Subjective:    Patient ID: Jenny [name], female    DOB: Nov 18, 1940, 77 y.o.   MRN: [id number]  HPI 77 y/o WF whom I last saw in 2011-01-20  Self-referred for a pulmonary evaluation due to dyspnea>>   ~  Mar 11, 2011:  61yr ROV & follow up for her AR, AB, & OSA> she lives in [place] & is now seeing DrBlythe for Primary Care (see prob list below & labs done 4/12);  She notes that her mother, Jenny [name], passed away 2022-11-07- tearful;  Her CC is tired, fatigue, not resting well, wakes tired;  As noted she has known OSA- see below, but only using CPAP intermittently- she thinks hard to sleep due to pain in her hands (complex prob involving DrDeveshwar, DrGramig- prev CTS surg but no better after this, etc)... She has +daytime hypersomnolence & Epworth Sleepiness score of 16>  We reviewed her labs from April & did f/u CXR today- NAD... Improvement will be dependent on diet, exercise, wt reduction, better CPAP compliance, & resolution of her hand dilemma...  ~  Feb 10, 2016:  44yr ROV>  I remember Jenny [name] & her mother (Jenny [name]- passed away in 2011/01/20 in her 15s w/ mult medical issues);  As noted below Jenny [name] has a Hx of AR/ AB, OSA on CPAP, Obesity, and mult medical issues including:  HBP, hx CP, Hypothyroid, GERD, Divertics, hx urinary incont, DJD, LBP, VitD defic & Dysthymia... She has been seeing Dr. Penni Homans for her Primary Care needs & generally stable but requested this appt due to "recurrent asthma" symptoms w/ incr intermittent SOB, wheezing, cough w/ beige sput; she's noted DOE w/ walking x yrs and progressing to ADLs like yard work, housework, even making the beds but she has been very sedentary & not exercising;  She denies CP, palpit, edema... Jenny [name] admits to me that she is under a lot of stress- grand children 26 & 18 are under her responsibility since her daugh was killed in a MVA in 20-Jan-2000...    Jenny [name] is an ex-smoker-- starting around 59, smoked for 30+yrs up to 1.5ppd & quit in 01/20/96;   She has a hx of asthma on SYMBICORT160-2spBid, Singulair10, & AlbutHFA rescue inhaler as needed;  She is obese and has a hx of OSA on CPAP Qhs via [facility]; she had a PSG 01-20-07 w/ AHI=29/H & worse in REM, desat to 62%, no arrhythmias, rare leg jerks, mod snoring; she was titrated to CPAP 12 & saw DrClance at the time...    Jenny [name] has had a lot of Ortho/ NS problems w/ neck pain=> C5-6 fusion followed by decompression & fusion C3-4 & C4-5 [id number] by DrBotero;  Then she had lumbar surg w/ L4 to S1 fusion Nov2012 foolowed by rehab... EXAM shows Afeb, VSS, O2sat=97% on RA at rest;  Wt=214#, 5'5"Tall, BMI=36;  HEENT- neg, mallampati2;  Chest- clear x sl dry cough;  Heart- RR w/o m/r/g;  Abd- obese w/ panniculus, soft, nontender;  Ext- VI, no edema;  Neuro- intact...  CXR 02/10/16>  Norm heart size, right cardiophrenic angle blunting likely due to a prom epicardial fat pad, clear lungs, surg clips in upper abd and lower Tspine fixation- NAD...  Spirometry 02/10/16>  FVC=1.88 (66%), FEV1=1.47 (70%), %1sec=78, mid-flows are wnl at 81% predicted;  This is c/w mild-mod restriction w/o evid for obstructive dis...   Ambulatory oximetry on RA 02/10/16>  O2sat=97% on RA at rest w/ pulse=69/min;  She ambulated 3 Laps (185' each) in our  office w/ lowest O2sat=95% w/ pulse=95/min; no desaturations...  Last LABS in [facility] 10/2015>  Chems- ok x BS=124, A1c=5.8;  CBC- wnl;  TSH=2.28... IMP>>      Dyspnea>  This is multifactorial w/ components from AB, pulm restriction due to obesity, deconditioning, and a signif component due to anxiety/stress I believe...    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    OSA>  Prev sleep study 2008 reviewed & she needs new ResMed S10 machine, supplies, then download...    Cardiac issues>  She saw DrHochrein in the past (2008) for HBP, CP, obesity, VI/edema; 2DEcho showed norm LVF, no regional wall motion  abn, trivAI & trivMR; Myoview was ordered but ?never done?    Obesity/ IFG>  BMI=36 and she has Hx borderline DM- on diet alone...    Medical issues>  HBP, mixed hyperlipidemia, IFG, Hypothyroid, DJD, Anxiety; on Lisin5, Synthroid100, Xanax0.5.Marland KitchenMarland Kitchen    Ortho/ Neurosurg> she is s/p neck surgeries (DrBotero) and lumbar fusion as well; her orthopedist is Dr. Alphonzo Severance; on Neurontin [phone]...    Anxiety/ stress>  Her daugh was killed in a MVA 2001, she is raising the grand children, her mother passed away in her 28s, she works 9H/d sitting w an elderly pt, etc...  PLAN>>      We discussed a trial of KLONOPIN 0.5mg Bid along w/ continuing her Symbicort160, Singulair10 & a trial of NEB meds Tid; we also rec a new CPAP set up w/ download in 30d;  I let her ventilate, she is in agreement w/ this plan & very appreciative... we plan ROV ~6wks.  ~  April 07, 2016:  6wk ROV and Jenny [name] reports that she is much improved & credits mostly the new CPAP machine- she loves it, now resting well Qhs & DOWNLOAD shows 7H/night, Auto5-15, using nasal pillows w/ sl leak & AHI<1 "I've never slept this well" & wakes refreshed, no daytime hypersom issues etc... As far as our other modalities of treatment for her condition> she is not using the [place] regularly, not using the [facility] regularly but notes that both meds help when she uses them PRN... Unfortunately she has not taken to heart our discussion regarding DIET & EXERCISE => weight reduction (her weight is up 6# & she is still "sitting" 9H/d w/ an elderly pt... EXAM shows Afeb, VSS, O2sat=97% on RA at rest;  Wt=220#, 5'5"Tall, BMI=37;  HEENT- neg, mallampati2;  Chest- clear w/o w/r/r;  Heart- RR w/o m/r/g;  Abd- obese w/ panniculus, soft, nontender;  Ext- VI, no edema;  Neuro- intact... IMP/PLAN>>  Problem list as above- we reviewed plans for [place] Rx but she prefers to use these just prn; she loves the new CPAP machine & is resting well, better energy & daytime  alertness, asked to translate this into better diet & EXERCISE program to effect wt reduction; we plan ROV in 10mo...   ~  June 23, 2016:  49mo ROV & Jenny [name] presents w/ a recurrent cough- deep, dry w/o sput or hemoptysis, some wheezing, & seems worse at night & in the AM;  She denies heartburn, reflux, etc;  She remains SOB "like I can't get a deep breath", denies CP/ palpit/ f/c/s, etc;  Sleeping well w/ her new CPAP machine, but she is still NOT exercising, ?DIET, wt unchanged at 220#, BMI=36-37... SEE PROB LIST ABOVE > she saw DrBlyth 05/2016 c/o LBP & constip, prev seen by  NS-Botero & Ortho-Dean;  BP/ Thyroid/ DM/ HL were all felt to be stable- continue same meds...     EXAM shows Afeb, VSS, O2sat=97% on RA;  Wt=220#, 5'5"Tall, BMI=37;  HEENT- neg, mallampati2;  Chest- clear w/o w/r/r;  Heart- RR w/o m/r/g;  Abd- obese w/ panniculus, soft, nontender;  Ext- VI, no edema;  Neuro- intact... IMP/PLAN>>  Jenny [name] needs more regular dosing w/ her breathing meds=> REC DuonebBid regularly & up to Qid prn, Symbicort160=-2spBid, Singulair10;  Add-in MUCINEX 600-2Bid, Fluids, & Delsym prn;  She needs a vigorous low carb low fat diet & incr exercise program of effect weight reduction;  Continue CPAP nightly;  Needs KLONOPIN 05- take 1/2 tab Bid;  Given Pneumovax-23 today...   ~  March 15, 2017:  10mo ROV & add-on appt requested for increased SOB>  Jenny [name] says "I'm having a problem w/ fluid"; BP was elev & meds adjusted by PCP- on amlod5 & HCT12.5, ultimately Demadex 20 was added for edema, then increased to 40mg /d;  She is c/o incr SOB x1-2wks she says, wakes at night for a NEB rx, notes wheezing, dry cough w/o sput, denies f/c/s, mild chest discomfort from the cough; we reviewed the following medical problems during today's office visit >>     She saw PCP-DrTabori 12/24/16 w/ HBP> on Amlod5 & HCT12.5 + Lasix20 prn swelling; good control w/ BP=120/80    She saw her PCP-CodyMartinPA on 02/16/17 w/ peripheral edema>  she was already on HCT 12.5mg  & Demadex20 added (later incr to 2/d) & reminded about a low salt diet;  Labs showed HCO3=31, BS=110, Cr=0.79, BNP=47;      Dyspnea>  This is multifactorial w/ components from AB, pulm restriction due to obesity, deconditioning, and a signif component due to anxiety/stress I believe...    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    OSA>  Prev sleep study 2008 reviewed & she received a new ResMed S10 machine in 2017 & loves it, now resting well Qhs & DOWNLOAD 6/17 showed 7H/night, Auto5-15, using nasal pillows w/ sl leak & AHI<1 "I've never slept this well" & wakes refreshed, no daytime hypersom issues etc    Cardiac issues>  She saw DrHochrein in the past (2008) for HBP, CP, obesity, VI/edema; 2DEcho showed norm LVF, no regional wall motion abn, trivAI & trivMR; Myoview was ordered but ?never done?    Obesity/ IFG>  BMI=35 and she has Hx borderline DM- on diet alone...    Medical issues>  HBP, mixed hyperlipidemia, IFG, Hypothyroid, DJD, Anxiety; on Amlod5, HCT12.5, Demadex40, Synthroid100, Neurontin300Tid, Xanax0.5 taking 1-2/d (never took the Klonopin)...    Ortho/ Neurosurg> she is s/p neck surgeries (DrBotero) and lumbar fusion as well; her orthopedist is Dr. Alphonzo Severance; on Neurontin & they did extensive Lumbar Myelogram 10/2016....    Anxiety/ stress>  Her daugh was killed in a MVA 2001, she is raising the grand children, her mother passed away in her 79s, she works 9H/d sitting w an elderly pt, etc...     EXAM shows Afeb, VSS, O2sat=94% on RA;  Wt=215#, 5'5"Tall, BMI=35;  HEENT- neg, mallampati2;  Chest- clear w/o w/r/r;  Heart- RR w/o m/r/g;  Abd- obese w/ panniculus, soft, nontender;  Ext- VI, no edema;  Neuro- intact...  EKG 02/19/17 showed NSR, rate 75, LAD, otherw wnl/ NAD...  2DEcho 03/10/17 => resched & done 03/16/17> norm LVF w/  EF=60-65%, no regional wall motion abn, norm LV diastolic parameters, norm AoV, norm MV w/ calcif annulus, norm LA&RA, norm PA pressure.  CXR 03/15/17 (independently reviewed by me in the PACS system) shows norm heart size, right heart border epicard fat pad, no adenopathy, clear lungs- NAD, postop changes in Cspine...   LABS 03/15/17>  Chems- HCO3=35, BS=125, Cr=1.12;  BNP=35,  Sed=22...  IMP/PLAN>>  We discussed w/ Jenny [name] to start her NEB rx Qid, continue the Symbicort160-2spBid, add MUCINEX600 Qid, and we will place her on a MEDROL8mg - tapering sched (see AVS);  Finally we reviewed the Low sodium diet & decided to continue the Demadex40 but STOP the HCT12.5 at this point... we plan ROV in 3-4wks.  ~  May 26, 2017:  51mo ROV & add-on appt requested for "breathing issues":>  She is c/o leg cramps and swelling NOT going down w/ her Demadex rx (20mg Bid);  Despite the fact that her Spirometry is mostly restricted and we've discussed mult times how she needs to lose wt to help this, she was asked to use her [place] (but she is only doing it Bid on some days), on Symbicort160-2spBid, Singulair10, Mucinex600Qid w/ fluids; she notes tha MEDROL really helps and she wants to keep some on hand for "prn" use "pill in the pocket" & says she averages 2/wk & says it works well-- I stressed to her the neg side effects of this med & would prefer to have her use the [place] regualrly & add BUDESONIDE Bid as well...     Dyspnea>  This is multifactorial w/ components from AB, pulm restriction due to obesity, deconditioning, and a signif component due to anxiety/stress I believe...    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    OSA>  Prev sleep study 2008 reviewed & she received a new ResMed S10 machine in 2017 & loves it, now resting well Qhs & DOWNLOAD 6/17 showed 7H/night, Auto5-15, using nasal  pillows w/ sl leak & AHI<1 "I've never slept this well" & wakes refreshed, no daytime hypersom issues etc    Obesity/ IFG>  Weight is up to 220#, BMI=35 and she has Hx borderline DM- on diet alone...    EXAM shows Afeb, VSS, O2sat=97% on RA; Wt=220#, 5'6"Tall, BMI=35;  HEENT- neg, mallampati2;  Chest- clear w/o w/r/r;  Heart- RR w/o m/r/g;  Abd- obese w/ panniculus, soft, nontender;  Ext- VI, no edema;  Neuro- intact... IMP/PLAN>>  Jenny [name] wants to use her extra Medrol 4mg  tabs as needed & says she's been doing this about 2x/wk & it really helps; I have warned her of the side effects and why I would prefer her to use the DUONEB regularly & add-in Budesonide 0.25 Bid regularly; rec to continue the SymbicortBid, Singular daily & the Mucinex600Qid + fluids;  We plan recheck in 6-8wks...  ~  July 21, 2017:  92mo ROV & pulmonary follow up visit> Jenny [name] reports that she has been more regular w/ her pulm regimen of Duoneb Bid (plus extra Duoneb prn), Budesonide Bid, Symbicort160-2spBid, Singulair10/d, Mucinex500Qid w/ fluids; she has used 1 Medrol4mg  tab about every other week (4 tabs in the last 6mo) & this is improved from her prev dependence on this med;  She reports extra stress due to a close friend w/ metastatic breast cancer who is terminal- on Xanax 0.5mg  1/2 to 1 tab Tid as needed (she reports  that Pharm won't let her have Klonopin because she is on the Xanax)...  She reports good & bad days> some cough, min sput, +congestion w/ "tightness" and wheezing- she notes that 2nd hand smoke is really tough for her and perfumes/ strong odors too...    She saw PCP-DrTabori for annual exam 8/23/128>  Note reviewed, HBP, hypothy, VitD defic, anxiety/depression, etc...    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 EXAM shows Afeb, VSS, O2sat=96% on RA; Wt=218#, 5'6"Tall, BMI=35;  HEENT-  neg, mallampati2;  Chest- clear w/o w/r/r;  Heart- RR w/o m/r/g;  Abd- obese w/ panniculus, soft, nontender;  Ext- VI, no edema;  Neuro- intact...  Mammogram 9/18> NEG, and she is relieved  BMD 9/18>  wnl w/ lowest Tscore in Left FemNeck -1.0  LABS in Epic 06/2017>  Chems- wnl;  CBC- wnl & 5% eos (400), TSH=3.29... IMP/PLAN>>  REC to continue her regular meds as outlined REGULARLY & continue the Medrol4mg  "pill-in-the-pocket" per her request;  Needs better diet. Exercise, wt reduction;  Try the Xanax 0.5mg  1/2 to 1 tab Tid more regularly...   ~  September 22, 2017:  63mo ROV & Jenny [name] called 12/7 c/o cough, yellow sput, incr chest congestion, wheezing x2d; we called in [place] w/ min improvement so far- she wants more "Medrol"; she denies f/c/s but still feels "tight"/ congested/ wheezing; activities & exercise are lim breathing & fatigue;  She has DUONEB (using Bid-Tid), Budes for NEB Bid, Symbicort160-2spBid, Singulair10, Mucinex600-take 1-2/d... We discussed increasing these meds to max advantage...  We reviewed the following medical problems during today's office visit >>     Dyspnea>  This is multifactorial w/ components from AB, pulm restriction due to obesity, deconditioning, and a signif component due to anxiety/stress I believe...    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    OSA>  Prev sleep study 2008 reviewed & she received a new ResMed S10 machine in 2017 & loves it, now resting well Qhs & DOWNLOAD 6/17 showed 7H/night, Auto5-15, using nasal pillows w/ sl leak & AHI<1 "I've never slept this well" & wakes refreshed, no daytime hypersom issues etc    Obesity/ IFG>  Weight is +- stable at 215#, BMI=35 and she has Hx borderline DM- on diet alone... EXAM shows Afeb, VSS, O2sat=94% on RA; Wt=215#, 5'6"Tall, BMI=34-5;  HEENT- neg, mallampati2;   Chest- clear w/o w/r/r;  Heart- RR w/o m/r/g;  Abd- obese w/ panniculus, soft, nontender;  Ext- VI, no edema;  Neuro- intact... IMP/PLAN>>  Amaura has hx refractory AB & is "dependent" on Medrol=> we discussed increasing DUONEB to Qid, continue Budes Bid, continue Symbicort160-2spBid, Singulair10, incr Mucinex600Qid w/ fluids; we refilled her MEDROL 4mg  tabs to continue her "pill in the pocket" regimen that she likes & advised to minimize the steroid use; we reviewed diet/ exercise/ wt reduction strategies...   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  We reviewed the following interval medical notes avail in Epic-EMR>      She saw PCP office- WMartin,PA on 10/28/17>  2d hx itchy watery eyes, & she was felt to have allergic conjunctivitis- topical rx discussed + Astelin drops...    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  We reviewed the following medical problems during today's office visit>    Dyspnea>  This is multifactorial w/ components from AB, pulm restriction due to obesity, deconditioning, and a signif component due to anxiety/stress I believe...    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    OSA>  Prev sleep study 2008 reviewed & she received a new ResMed S10 machine in 2017 & loves  it, now resting well Qhs & DOWNLOAD 6/17 showed 7H/night, Auto5-15, using nasal pillows w/ sl leak & AHI<1 "I've never slept this well" & wakes refreshed, no daytime hypersom issues etc    Obesity/ IFG>  Weight is +- stable at 215#, BMI=35 and she has Hx borderline DM- on diet alone... EXAM shows Afeb, VSS, O2sat=94% on RA; Wt=215#, 5'6"Tall, BMI=34-5;  HEENT- neg, mallampati2;  Chest- clear w/o w/r/r;  Heart- RR w/o m/r/g;  Abd- obese w/ panniculus, soft, nontender;  Ext- VI, no edema;  Neuro- intact... IMP/PLAN>>  Oniya is reminded to do her DUONEB treatments qid regularly, we will add LEVAQUIN 500mg Qd x7d and Tessalon Perles as needed;  She needs to incr exercise (lim by LBP), she has Medrol4mg  for prn use...  We will plan rov in 3-57mo w/ f/u CXR & any needed labs...  ~  April 21, 2018:  40mo ROV & Jenny [name] is about the same- she's been walking some but notes the heat bothers her as well as her LBP;  She has persistent cough, small amt beige/ green sput, no change in baseline SOB/DOE, but she does report some choking episodes w/ incr SOB esp at night when supine & we discussed the roll of GERD, noct reflux, & treatment w/ PPI about 21min before dinner, NPO after dinner, elev HOB 6";  NOTE: she is still sitting w/ an elderly lady Qhs on weekdays- often sleeping in recliner...     We reviewed the following interval medical notes avail in Epic-EMR>      She saw several physicians w/ rash=> DERM- DrDJones>  Jenny [name] tells me this was eventually proven to be BED BUGS (at the lady's house she sits for) and she is improved w/ Rx there & at her own place as well... We reviewed the following medical problems during today's office visit>    Dyspnea>  This is multifactorial w/ components from AB, pulm restriction due to obesity, deconditioning, and a signif component due to anxiety/stress I believe...    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 ...    OSA>  Prev sleep study 2008 reviewed & she received a new ResMed S10 machine in 2017 & loves it, now resting well Qhs & DOWNLOAD 6/17 showed 7H/night, Auto5-15, using  nasal pillows w/ sl leak & AHI<1 "I've never slept this well" & wakes refreshed, no daytime hypersom issues etc    Obesity/ IFG>  Weight is +- stable at 215# range, BMI=35 and she has Hx borderline DM- on diet alone; we reviewed low card diet + exercise for wt reduction... EXAM shows Afeb, VSS, O2sat=94% on RA; Wt=210#, 5'6"Tall, BMI=34;  HEENT- neg, mallampati2;  Chest- clear w/o w/r/r;  Heart- RR w/o m/r/g;  Abd- obese w/ panniculus, soft, nontender;  Ext- VI, no edema;  Neuro- intact...  CXR 04/21/18 (independently reviewed by me in the PACS system) shows norm heart size, clear lungs- NAD, sl pleural thickening noted...   SPIROMETRY 04/21/18>  FVC=1.60 (58%), FEV1=1.33 (64%), %1sec=83%, mid-flows wnl at 99% predicted; THIS IS RESTRICTED, not obstructed; we reviewed Rx for diet, exercise weight reduction... IMP/PLAN>>  Abreanna's CXR is clear & Spirometry shows a restrictive ventilatory defect (not obstructive) indicating that she could lighten up on her Neb rx but rec continuing the Symbicort for the AB component, advise minimizing & elim the Medrol use, AND get on the diet/ exercise/ wt reduction program!!!  In addition we are initiating a vigorous antireflux regimen=> PROTONIX 40mg  taken 30 min before dinner, NPO after dinner, elevate HOB 6" on blocks... She notes that she has to avoid smoking, strong odors, & other exposures...    ~  August 23, 2018:  34mo ROV & Jenny [name] saw Jenny [name] ago for dry cough & incr SOB (MMRC score +3), no discolored sput or f/c/s; she took her Medrol4mg  pill-in-the-pocket but it did not improve her symptoms; last CXR 04/2018 was clear, & repeat CXR 07/06/18 was similarly clear- NAD;   She was treated w/ ZPak, Medrol taper, Tessalon, and given Protonix40 & Pepcid20 for GERD hx...  She reports improved on the regimen & back to baseline but she states that she awoke at 5AM coughing, congested, wheezing and we reviewed how nocturnal reflux is the big culprit here => needs better sleep hygiene- sleep in bed w/ head elev 6" on blocks, NPO after dinner in the eve, take Protonix40 about 70min before the eve meal, ok to take Pepcid20 w/ sip at bedtime... On review she is still not taking her meds regularly & things like her antireflux regimen need to be vigorously applied every night!  Only using Duoneb-Bid, Budes-Bid, Symbicort160-2spBid, Singular10- not regularly, Mucinex600- as needed, Tessalon as needed... We reviewed the following medical problems during today's office visit>  PCP is DrTabori, pt's labs were 06/2017 & ok...    Dyspnea>  This is multifactorial w/ components from AB, pulm restriction due to obesity, deconditioning, and a signif component due to anxiety/stress I believe...    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 ...    OSA>  Prev sleep study 2008 reviewed & she received a new ResMed S10 machine in 2017 & loves it, now resting well Qhs & DOWNLOAD 6/17 showed 7H/night, Auto5-15, using nasal pillows w/ sl leak & AHI<1 "I've never slept this well" & wakes refreshed, no daytime hypersom issues etc    Obesity/ IFG>  Weight is +- stable at 210# range, BMI=34 and she has Hx borderline DM- on diet alone; we reviewed low card diet + exercise for wt reduction...    GERD w/ nocturnal reflux>  She has been repeatedly advised on Protonix40 about 36min before  dinner, NPO after dinner, elev HOB 6", ok to take pepcid prior to bedtime w/ sip of water... EXAM shows Afeb, VSS, O2sat=98% on RA; Wt=208#,  5'6"Tall, BMI=34;  HEENT- neg, mallampati2;  Chest- few wheezes & rhonchi at bases w/o consolidation;  Heart- RR w/o m/r/g;  Abd- obese w/ panniculus, soft, nontender;  Ext- VI, no edema;  Neuro- intact...  CXR 07/06/18>  Norm heart size, prom right cardiophrenic epicardisl fat pad (unchanged), clear lungs, NAD; prior Cspine fusion... IMP/PLAN>>  We again reviewed a treatment program designed to keep her out of trouble & diminish the number of exascerbation -- Acute Rx w/ ZPak & Medrol taper (see AVS), chronic regimen w/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

## 2018-08-23 NOTE — Patient Instructions (Signed)
Today we updated your med list in our EPIC system...    Continue your current medications the same...  We discussed the importance of the antireflux regimen>>    Take the Protonix 40mg  1hour before dinner in the eve..    Do not eat or drink antthing after dinner in the eve....    Elevate the head of your bed 6 inches...  Use your NEBULIZER with the DUONEB 4 times daily & the BUDESONIDE twice daily...  Continue the SYMBICORT 2 sprays twice daily as we discussed...  Resume the MUCINEX 600mg  4 times daily w/ a glass of water...  FOR THE ACUTE episode w/ cough/ congestion/ and the shortness of breath>>    We gave you a Depo shot today, & wrote for the MEDROL Dosepak to start tomorrow AM...    We wrote for a ZPak to take as directed...  Continue on your CPAP each night as you are doing...  Call for any questions or if we can be of service in any way...  We will arrange for a follow up appt w/ my partner DrSood in 68mo or so,.Marland KitchenMarland Kitchen

## 2018-08-24 NOTE — Telephone Encounter (Signed)
Please advise if ok to refill- last given 90 tablets with 5 rf on 02/23/18 Rx refill expired on 08/22/18  Next ov 11/01/18 with Dr Halford Chessman

## 2018-08-26 ENCOUNTER — Other Ambulatory Visit: Payer: Self-pay | Admitting: Family Medicine

## 2018-09-17 ENCOUNTER — Other Ambulatory Visit: Payer: Self-pay | Admitting: Pulmonary Disease

## 2018-09-25 ENCOUNTER — Other Ambulatory Visit: Payer: Self-pay | Admitting: Pulmonary Disease

## 2018-11-01 ENCOUNTER — Encounter: Payer: Self-pay | Admitting: Pulmonary Disease

## 2018-11-01 ENCOUNTER — Ambulatory Visit (INDEPENDENT_AMBULATORY_CARE_PROVIDER_SITE_OTHER): Payer: Medicare HMO | Admitting: Pulmonary Disease

## 2018-11-01 VITALS — BP 114/78 | HR 86 | Ht 65.0 in | Wt 213.2 lb

## 2018-11-01 DIAGNOSIS — G4733 Obstructive sleep apnea (adult) (pediatric): Secondary | ICD-10-CM

## 2018-11-01 DIAGNOSIS — K219 Gastro-esophageal reflux disease without esophagitis: Secondary | ICD-10-CM | POA: Diagnosis not present

## 2018-11-01 DIAGNOSIS — J449 Chronic obstructive pulmonary disease, unspecified: Secondary | ICD-10-CM

## 2018-11-01 DIAGNOSIS — J301 Allergic rhinitis due to pollen: Secondary | ICD-10-CM

## 2018-11-01 DIAGNOSIS — G4730 Sleep apnea, unspecified: Secondary | ICD-10-CM

## 2018-11-01 DIAGNOSIS — E669 Obesity, unspecified: Secondary | ICD-10-CM

## 2018-11-01 MED ORDER — FLUTICASONE PROPIONATE 50 MCG/ACT NA SUSP: 1.0000 | Freq: Every day | NASAL | 2 refills | Status: DC

## 2018-11-01 NOTE — Progress Notes (Signed)
[name]  Chief Complaint  Patient presents with  . Follow-up    Pt is not using cpap machine everynight, not complaint on airview as she works 5 nights out of the week. Pt has productive cough-clear/yellow at times, some chest tightness.     Constitutional:  BP 114/78 (BP Location: Left Arm, Cuff Size: Normal)   Pulse 86   Ht 5\' 5"  (1.651 m)   Wt 213 lb 3.2 oz (96.7 kg)   SpO2 94%   BMI 35.48 kg/m   Past Medical History:  Vit D deficiency, Neuropathy, Low back pain, Hypothyroidism, HTN, GERD, Diverticulosis, Depression, Anxiety, DJD  Brief Summary:  Jenny [name] is a 78 y.o. female former smoker with OSA, and COPD with chronic bronchitis.  She was previously seen by Dr. Leeanne Deed.  She works at night sitting for elderly patients.  As a result she can't consistently use CPAP.  This helps. No issues with mask fit.    She still has cough with post nasal drip.  Her voice is scratchy.  She will bring up clear to white sputum.  Not having fever, hemoptysis, chest pain, or wheeze from her chest.  She isn't using any nose sprays.  She uses pepcid in the morning and nexium at night.  She was using singulair.  Caused nightmares.  Didn't help with breathing, so she stopped this.   Physical Exam:   Appearance - well kempt   ENMT - clear nasal mucosa, midline nasal  septum, no oral exudates, no LAN, trachea midline, clear nasal drainage, raspy voice  Respiratory - normal chest wall, normal respiratory effort, no accessory muscle use, no wheeze/rales  CV - s1s2 regular rate and rhythm, no murmurs, mild peripheral edema, radial pulses symmetric  GI - soft, non tender, no masses  Lymph - no adenopathy noted in neck and axillary areas  MSK - normal gait  Ext - no cyanosis, clubbing, or joint inflammation noted  Skin - no rashes, lesions, or ulcers  Neuro - normal strength, oriented x 3  Psych - normal mood and affect  CXR  07/06/18 (reviewed by me) >> hyperinflation   Assessment/Plan:   COPD with chronic bronchitis. - reviewed her medication regimen in detail - will continue symbicort and prn albuterol - she can also use duoneb as needed - will d/c singulair and pulmicort - don't think she needs depo medrol shot or antibiotics at this time - prn tessalon, mucinex  Allergic rhinitis with post nasal drip. - will have her try nasal irrigation and flonase  Obstructive sleep apnea. - she reports benefit from CPAP - advised her to use this whenever she is asleep to get maximum benefit  GERD. - pepcid, nexium per PCP  Obesity. - discussed importance of weight loss  Leg edema. - mild - advised her to resume lasix and f/u with PCP    Patient Instructions  Try using CPAP whenever you are asleep  Use saline nasal spray daily for next two weeks, then as needed  Use flonase daily for the next two weeks, then as needed  Follow up in 4 months   Time spent 28 minutes  Chesley Mires, MD [place] Pager: [phone] 11/01/2018, 10:11 AM  Flow Sheet     Pulmonary tests:  Spirometry 04/21/18 >> FEV1 1.3 (64%), FEV1% 83  Sleep tests:  PSG April 2008 >> AHI 29, SpO2 low 62%  Cardiac tests:  Echo 03/16/17 >> EF 60 to 65%  Medications:  Allergies as of 11/01/2018      Reactions   Prednisone Other (See Comments)   Confusion, dizziness Medrol is ok   Codeine Nausea Only       Tramadol Itching      Medication List       Accurate as of November 01, 2018 10:11 AM. Always use your most recent med list.        albuterol 108 (90 Base) MCG/ACT inhaler Commonly known as:  PROVENTIL HFA;VENTOLIN HFA Inhale 2 puffs into the lungs every 6 (six) hours as needed. For wheezing   ALPRAZolam 0.5 MG tablet Commonly known as:  XANAX TAKE 1/2 TO 1 TABLET BY MOUTH THREE TIMES DAILY AS NEEDED FOR ANXIETY   amLODipine 5 MG tablet Commonly known as:  NORVASC TAKE 1 TABLET(5 MG)  BY MOUTH DAILY   aspirin EC 81 MG tablet Take 81 mg by mouth daily.   benzonatate 200 MG capsule Commonly known as:  TESSALON Take 1 capsule (200 mg total) by mouth 3 (three) times daily as needed for cough.   budesonide-formoterol 160-4.5 MCG/ACT inhaler Commonly known as:  SYMBICORT Inhale 2 puffs into the lungs 2 (two) times daily.   escitalopram 5 MG tablet Commonly known as:  LEXAPRO TAKE 1 TABLET(5 MG) BY MOUTH DAILY   famotidine 20 MG tablet Commonly known as:  PEPCID Take 1 tablet (20 mg total) by mouth at bedtime.   fluticasone 50 MCG/ACT nasal spray Commonly known as:  FLONASE Place 1 spray into both nostrils daily.   furosemide 20 MG tablet Commonly known as:  LASIX Take 20 mg by mouth. Take 2 tablet daily as needed   guaiFENesin 600 MG 12 hr tablet Commonly known as:  MUCINEX Take 600 mg by mouth 4 (four) times daily.   ibuprofen 200 MG tablet Commonly known as:  ADVIL,MOTRIN Take 400 mg by mouth every 6 (six) hours as needed for moderate pain.   ipratropium-albuterol 0.5-2.5 (3) MG/3ML Soln Commonly known as:  DUONEB USE 3 ML VIA NEBULIZER FOUR TIMES DAILY   levothyroxine 100 MCG tablet Commonly known as:  SYNTHROID, LEVOTHROID TAKE 1 TABLET BY MOUTH EVERY DAY BEFORE BREAKFAST   methylPREDNISolone 4 MG Tbpk tablet Commonly known as:  MEDROL DOSEPAK 6 day pak-take as directed   pantoprazole 40 MG tablet Commonly known as:  PROTONIX TAKE 1 TABLET BY MOUTH DAILY 30 MINUTES BEFORE DINNER       Past Surgical History:  She  has a past surgical history that includes Appendectomy; Tonsillectomy and adenoidectomy; AP repair and sling for cystocele (8-04 ); left hand (2010); ctr; Shoulder arthroscopy distal clavicle excision and open rotator cuff repair; Neck surgery; Abdominal hysterectomy; Cholecystectomy; carpal tunel (2008); and Tubal ligation.  Family History:  Her family history includes Anxiety disorder in her daughter; Arthritis in her brother  and mother; Asthma in her daughter; COPD in her mother; Cancer in her maternal aunt, maternal aunt, and paternal grandmother; Colon polyps in her daughter; Dementia in her father; Heart disease in her maternal grandfather, maternal grandmother, and mother; Hypertension in her mother; Kidney disease in her paternal grandfather; Other in her maternal grandfather, mother, and paternal grandfather; Pneumonia in her mother; Stroke in her maternal grandmother.  Social History:  She  reports that she quit smoking about 27 years ago. Her smoking use included cigarettes. She has a 45.00 pack-year smoking history. She has never used smokeless tobacco. She reports that she does not drink alcohol or use drugs.

## 2018-11-01 NOTE — Patient Instructions (Signed)
Try using CPAP whenever you are asleep  Use saline nasal spray daily for next two weeks, then as needed  Use flonase daily for the next two weeks, then as needed  Follow up in 4 months

## 2018-11-22 ENCOUNTER — Other Ambulatory Visit: Payer: Self-pay | Admitting: Family Medicine

## 2018-12-14 ENCOUNTER — Encounter (INDEPENDENT_AMBULATORY_CARE_PROVIDER_SITE_OTHER): Payer: Self-pay | Admitting: Orthopedic Surgery

## 2018-12-14 ENCOUNTER — Ambulatory Visit (INDEPENDENT_AMBULATORY_CARE_PROVIDER_SITE_OTHER): Payer: Medicare HMO | Admitting: Orthopedic Surgery

## 2018-12-14 ENCOUNTER — Ambulatory Visit (INDEPENDENT_AMBULATORY_CARE_PROVIDER_SITE_OTHER): Payer: Medicare HMO

## 2018-12-14 DIAGNOSIS — M1712 Unilateral primary osteoarthritis, left knee: Secondary | ICD-10-CM | POA: Diagnosis not present

## 2018-12-14 DIAGNOSIS — M25562 Pain in left knee: Secondary | ICD-10-CM | POA: Diagnosis not present

## 2018-12-14 MED ORDER — METHYLPREDNISOLONE ACETATE 40 MG/ML IJ SUSP
40.0000 mg | INTRAMUSCULAR | Status: AC | PRN
  Administered 2018-12-14: 40 mg via INTRA_ARTICULAR

## 2018-12-14 MED ORDER — LIDOCAINE HCL 1 % IJ SOLN
5.0000 mL | INTRAMUSCULAR | Status: AC | PRN
  Administered 2018-12-14: 5 mL

## 2018-12-14 MED ORDER — BUPIVACAINE HCL 0.25 % IJ SOLN
4.0000 mL | INTRAMUSCULAR | Status: AC | PRN
  Administered 2018-12-14: 4 mL via INTRA_ARTICULAR

## 2018-12-14 NOTE — Progress Notes (Signed)
Office Visit Note   Patient: Jenny [name]           Date of Birth: Mar 03, 1941           MRN: [id number] Visit Date: 12/14/2018 Requested by: Midge Minium, MD [address] [place], [place] [address] PCP: Midge Minium, MD  Subjective: Chief Complaint  Patient presents with  . Left Knee - Pain    HPI: Jenny [name] is a patient with left knee pain.  She is had pain for several weeks.  Pain is worse at night.  Primarily on the medial aspect of the knee.  Pain is constant and gets worse with activities.  She has a known history of back pain and a lot of radiating back pain.  She cannot have anymore back surgery.  She reports global pain in the knee region with pain at rest.  He does have a radicular component to it.  Denies any mechanical symptoms in the knees.              ROS: All systems reviewed are negative as they relate to the chief complaint within the history of present illness.  Patient denies  fevers or chills.   Assessment & Plan: Visit Diagnoses:  1. Left knee pain, unspecified chronicity   2. Unilateral primary osteoarthritis, left knee     Plan: Impression is left knee pain with new medial compartment arthritis and effusion.  I would aspirate and inject that knee today.  I think we could repeat that in about 4 months if needed.  I will see her back as needed.  She does not really want any more back surgery or any knee surgery.  We may be able to manage this with injections moving forward.  Follow-Up Instructions: Return if symptoms worsen or fail to improve.   Orders:  Orders Placed This Encounter  Procedures  . XR KNEE 3 VIEW LEFT   No orders of the defined types were placed in this encounter.     Procedures: Large Joint Inj: L knee on 12/14/2018 11:10 PM Indications: diagnostic evaluation, joint swelling and pain Details: 18 G 1.5 in needle, superolateral approach  Arthrogram: No  Medications: 5 mL lidocaine 1 %; 40 mg methylPREDNISolone  acetate 40 MG/ML; 4 mL bupivacaine 0.25 % Outcome: tolerated well, no immediate complications Procedure, treatment alternatives, risks and benefits explained, specific risks discussed. Consent was given by the patient. Immediately prior to procedure a time out was called to verify the correct patient, procedure, equipment, support staff and site/side marked as required. Patient was prepped and draped in the usual sterile fashion.       Clinical Data: No additional findings.  Objective: Vital Signs: There were no vitals taken for this visit.  Physical Exam:   Constitutional: Patient appears well-developed HEENT:  Head: Normocephalic Eyes:EOM are normal Neck: Normal range of motion Cardiovascular: Normal rate Pulmonary/chest: Effort normal Neurologic: Patient is alert Skin: Skin is warm Psychiatric: Patient has normal mood and affect    Ortho Exam: Ortho exam demonstrates full active and passive range of motion of the right knee.  Left knee has about a 5 degree flexion contracture with mild effusion.  Collateral and cruciate ligaments are stable.  No groin pain with internal X rotation of either leg.  Pedal pulses palpable.  She does have medial greater than lateral sided tenderness on that left knee.  Specialty Comments:  No specialty comments available.  Imaging: Xr Knee 3 View Left  Result Date: 12/14/2018 AP lateral merchant left knee reviewed.  Progressive medial compartment arthritis has occurred since prior radiographs from 2018.  No acute fracture or dislocation.  Degenerative changes milder in the lateral and patellofemoral compartments.  Alignment is slight varus    PMFS History: Patient Active Problem List   Diagnosis Date Noted  . Cough 07/06/2018  . Peripheral edema 02/22/2017  . Localized edema 12/17/2016  . Colon cancer screening 06/15/2016  . 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 Other fatigue 11/03/2015  . Hyperglycemia 07/07/2015  . Ceruminosis  03/07/2015  . Left hip pain 04/26/2014  . Acute pain of left knee 04/26/2014  . Annual physical exam 08/06/2013  . Atrophic vaginitis 06/17/2012  . Peripheral vascular disease, unspecified ([place]) 06/02/2012  . Urticaria 10/15/2011  . Anemia 09/21/2011  . 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  . Vitamin D deficiency 02/12/2011  . Peripheral neuropathy 02/12/2011  . Intertrigo 01/21/2011  . Asthma 01/14/2011  . 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 History of chicken pox 01/14/2011  . History of measles 01/14/2011  . HAND PAIN 04/09/2010  . OTHER URINARY INCONTINENCE 03/12/2010  . Depression with anxiety 03/06/2008  . Essential hypertension 02/22/2008  . Chronic bronchitis ([place]) 02/22/2008  . DIVERTICULOSIS OF COLON 02/22/2008  . Unilateral primary osteoarthritis, left knee 02/22/2008  . Hypothyroidism 08/23/2007  . Obesity 08/23/2007  . Obstructive sleep apnea 08/23/2007  . Venous (peripheral) insufficiency 08/23/2007  . Allergic rhinitis 08/23/2007  . GERD 08/23/2007  . BACK PAIN, LUMBAR 08/23/2007   Past Medical History:  Diagnosis Date  . ALLERGIC RHINITIS 08/23/2007  . Allergy    rhinitis  . 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 Anemia 09/21/2011  . Annual physical exam 08/06/2013  . Asthma    mild, intermittent  . Asthma 01/14/2011  . Atrophic vaginitis 06/17/2012  . BACK PAIN, LUMBAR 08/23/2007  . Bronchitis   . BRONCHITIS, RECURRENT 02/22/2008  . Cough    usually not productive  . Degenerative joint disease   . DEGENERATIVE JOINT DISEASE 02/22/2008  . Depression with anxiety 03/06/2008   Qualifier: Diagnosis of  By: Lenna Gilford MD, Deborra Medina   . Diverticulosis of colon   . DIVERTICULOSIS OF COLON 02/22/2008  . Dysthymia   . 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  . elevated glucose 08/21/2008  . GERD 08/23/2007  . GERD (gastroesophageal reflux disease)   . HAND PAIN 04/09/2010  . History of chicken pox 01/14/2011  . History of measles 01/14/2011  . Hyperglycemia 07/07/2015  .  Hypertension   . HYPERTENSION 02/22/2008  . Hypothyroidism   . HYPOTHYROIDISM 08/23/2007  . Incontinence of urine   . Lumbar back pain   . Obesity   . OBESITY 08/23/2007  . Obstructive sleep apnea    does not use CPAP, lost weight  . Other urinary incontinence 03/12/2010  . Peripheral neuropathy 02/12/2011   possibly related to spondylolisthesis per pt  . PONV (postoperative nausea and vomiting)   . Sleep apnea   . SLEEP APNEA, OBSTRUCTIVE 08/23/2007  . Transient memory loss March 2006   after a stressful event she had no memory the rest of that day  . Urinary frequency   . Urinary incontinence   . UTI (lower urinary tract infection) 03/23/2012  . Venous insufficiency   . VENOUS INSUFFICIENCY 08/23/2007  . Vitamin D deficiency   . VITAMIN D DEFICIENCY 08/21/2008    Family History  Problem Relation Age of Onset  . COPD Mother   . Pneumonia Mother   .  Other Mother        CHF  . Hypertension Mother   . Arthritis Mother        septic knee after replacement  . Heart disease Mother        chf  . Dementia Father   . Arthritis Brother   . Asthma Daughter   . Colon polyps Daughter   . Stroke Maternal Grandmother   . Heart disease Maternal Grandmother   . Other Maternal Grandfather        CHF  . Heart disease Maternal Grandfather        chf  . Cancer Paternal Grandmother        stomach?  . Other Paternal Grandfather        problems with kidneys  . Kidney disease Paternal Grandfather   . Anxiety disorder Daughter   . Cancer Maternal Aunt        breast  . Cancer Maternal Aunt        liver  . Colon cancer Neg Hx     Past Surgical History:  Procedure Laterality Date  . ABDOMINAL HYSTERECTOMY     took one ovary  . AP repair and sling for cystocele  8-04    Dr Gaynelle Arabian  . APPENDECTOMY    . carpal tunel  2008  . CHOLECYSTECTOMY    . ctr     bilateral  . left hand  2010   left thumb, joint removal, [facility]  . NECK SURGERY     x 2 - discectomy, fusion  . SHOULDER  ARTHROSCOPY DISTAL CLAVICLE EXCISION AND OPEN ROTATOR CUFF REPAIR     right  . TONSILLECTOMY AND ADENOIDECTOMY    . TUBAL LIGATION     Social History   Occupational History  . Not on file  Tobacco Use  . Smoking status: Former Smoker    Packs/day: 1.50    Years: 30.00    Pack years: 45.00    Types: Cigarettes    Last attempt to quit: 10/13/1991    Years since quitting: 27.1  . Smokeless tobacco: Never Used  Substance and Sexual Activity  . Alcohol use: No    Alcohol/week: 0.0 standard drinks  . Drug use: No  . Sexual activity: Never

## 2019-01-13 ENCOUNTER — Telehealth: Payer: Self-pay | Admitting: Pulmonary Disease

## 2019-01-13 NOTE — Telephone Encounter (Signed)
Called and spoke with pt stating to her the recs per TN. Pt expressed understanding. Nothing further needed.

## 2019-01-13 NOTE — Telephone Encounter (Signed)
May take mucinex, delsym for cough (if needed), Flonase, and zyrtec

## 2019-01-13 NOTE — Telephone Encounter (Signed)
Primary Pulmonologist: VS Last office visit and with whom: 11/01/2018 with [facility] What do we see them for (pulmonary problems): COPD Last OV assessment/plan: Instructions   Return in about 4 months (around 03/02/2019).  Try using CPAP whenever you are asleep  Use saline nasal spray daily for next two weeks, then as needed  Use flonase daily for the next two weeks, then as needed  Follow up in 4 months       Was appointment offered to patient (explain)?  Pt wants to know recs to help with symptoms   Reason for call: Called and spoke with pt who stated drainage began x6 days ago but started having congestion and a cough with yellow-brown mucus.  Pt denies any chest tightness, fever, SOB.  Pt denies any recent travel and has not been around anyone that has been sick/has travelled.   Pt has taken mucinex to see if it would help with her symptoms.  Pt wants to know recs to help with symptoms. Tonya, please advise. Thanks!

## 2019-02-20 ENCOUNTER — Other Ambulatory Visit: Payer: Self-pay | Admitting: Pulmonary Disease

## 2019-02-22 ENCOUNTER — Ambulatory Visit: Payer: Medicare HMO | Admitting: Pulmonary Disease

## 2019-02-23 ENCOUNTER — Ambulatory Visit: Payer: Medicare HMO | Admitting: Family Medicine

## 2019-02-23 ENCOUNTER — Telehealth: Payer: Self-pay | Admitting: Pulmonary Disease

## 2019-02-23 NOTE — Telephone Encounter (Signed)
Left message for patient to call back.   Will need to explain to her that SN has retired and she will need to ask her PCP Dr. [name] for refills now.

## 2019-02-23 NOTE — Telephone Encounter (Signed)
Patient is returning phone call.  Patient phone number is [phone].

## 2019-02-23 NOTE — Telephone Encounter (Signed)
Called and spoke with Patient.  Patient stated pharmacy told her she needed new prescription for Alprazolam and request was denied.  Patient stated Alprazolam was started with PCP and Dr Lenna Gilford took over prescription, when he changed dose to three times daily as needed. Explained medications for pulmonary can be refilled through Dr Halford Chessman, but other medications would have to go through PCP.  Explained Dr Halford Chessman is currently not in office, because of Covid pandemic, and he is needed in hospital. Patient stated understanding.  Nothing further at this time.

## 2019-02-24 ENCOUNTER — Other Ambulatory Visit: Payer: Self-pay

## 2019-02-24 ENCOUNTER — Ambulatory Visit (INDEPENDENT_AMBULATORY_CARE_PROVIDER_SITE_OTHER): Payer: Medicare HMO | Admitting: Family Medicine

## 2019-02-24 ENCOUNTER — Ambulatory Visit: Payer: Medicare HMO | Admitting: Nurse Practitioner

## 2019-02-24 ENCOUNTER — Encounter: Payer: Self-pay | Admitting: Nurse Practitioner

## 2019-02-24 ENCOUNTER — Encounter: Payer: Self-pay | Admitting: Family Medicine

## 2019-02-24 VITALS — BP 128/72 | HR 68 | Temp 97.9°F | Ht 65.0 in | Wt 215.4 lb

## 2019-02-24 VITALS — BP 128/72 | HR 68 | Temp 97.9°F | Wt 215.0 lb

## 2019-02-24 DIAGNOSIS — R69 Illness, unspecified: Secondary | ICD-10-CM | POA: Diagnosis not present

## 2019-02-24 DIAGNOSIS — G4733 Obstructive sleep apnea (adult) (pediatric): Secondary | ICD-10-CM | POA: Diagnosis not present

## 2019-02-24 DIAGNOSIS — I10 Essential (primary) hypertension: Secondary | ICD-10-CM | POA: Diagnosis not present

## 2019-02-24 DIAGNOSIS — E039 Hypothyroidism, unspecified: Secondary | ICD-10-CM | POA: Diagnosis not present

## 2019-02-24 DIAGNOSIS — K219 Gastro-esophageal reflux disease without esophagitis: Secondary | ICD-10-CM

## 2019-02-24 DIAGNOSIS — J449 Chronic obstructive pulmonary disease, unspecified: Secondary | ICD-10-CM | POA: Diagnosis not present

## 2019-02-24 DIAGNOSIS — F418 Other specified anxiety disorders: Secondary | ICD-10-CM

## 2019-02-24 DIAGNOSIS — J309 Allergic rhinitis, unspecified: Secondary | ICD-10-CM

## 2019-02-24 MED ORDER — BUDESONIDE-FORMOTEROL FUMARATE 160-4.5 MCG/ACT IN AERO: 2.0000 | INHALATION_SPRAY | Freq: Two times a day (BID) | RESPIRATORY_TRACT | 0 refills | Status: DC

## 2019-02-24 MED ORDER — LEVOTHYROXINE SODIUM 100 MCG PO TABS: ORAL_TABLET | ORAL | 0 refills | Status: DC

## 2019-02-24 MED ORDER — ALPRAZOLAM 0.5 MG PO TABS: ORAL_TABLET | ORAL | 1 refills | Status: DC

## 2019-02-24 MED ORDER — AMLODIPINE BESYLATE 5 MG PO TABS: ORAL_TABLET | ORAL | 0 refills | Status: DC

## 2019-02-24 NOTE — Progress Notes (Signed)
@Patient  ID: Jenny [name], female    DOB: 16-Jan-1941, 78 y.o.   MRN: [id number]  Chief Complaint  Patient presents with  . Follow-up    COPD mixed type    Referring provider: Midge Minium, MD  HPI   78 year old female former smoker with OSA, COPD, chronic bronchitis who is followed by Dr. Halford Chessman.  Tests:  Pulmonary tests: Spirometry 04/21/18 >> FEV1 1.3 (64%), FEV1% 83  Sleep tests: PSG April 2008 >> AHI 29, SpO2 low 62%  Cardiac tests: Echo 03/16/17 >> EF 60 to 65%  OV 02/24/19 - Follow up Patient presents for follow-up visit.  States that this has been a stable interval for her.  She is compliant with Symbicort and uses albuterol as needed.  Uses Flonase daily.  She states that she has not needed her DuoNeb since last visit.  Patient states that she does wear her CPAP and benefits from use.  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  She is active.  Denies f/c/s, n/v/d, hemoptysis, PND, leg swelling Denies chest pain or edema    Allergies  Allergen Reactions  . Prednisone Other (See Comments)    Confusion, dizziness Medrol is ok  . Codeine Nausea Only       . Tramadol Itching    Immunization History  Administered Date(s) Administered  . Influenza Split 08/20/2011, 09/30/2012  . Influenza Whole 08/13/2013  . Influenza, High Dose Seasonal PF 10/13/2014, 09/03/2015, 06/03/2017, 09/25/2018  . Influenza-Unspecified 07/11/2016  . Pneumococcal Conjugate-13 03/16/2014  . Pneumococcal Polysaccharide-23 06/23/2016  . Tdap 09/22/2011  . Zoster 03/16/2014    Past Medical History:  Diagnosis Date  . ALLERGIC RHINITIS 08/23/2007  . Allergy    rhinitis  . 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 Anemia 09/21/2011  . Annual physical exam 08/06/2013  . Asthma    mild, intermittent  . Asthma 01/14/2011  . Atrophic vaginitis 06/17/2012  . BACK PAIN, LUMBAR 08/23/2007  . Bronchitis   . BRONCHITIS, RECURRENT  02/22/2008  . Cough    usually not productive  . Degenerative joint disease   . DEGENERATIVE JOINT DISEASE 02/22/2008  . Depression with anxiety 03/06/2008   Qualifier: Diagnosis of  By: Lenna Gilford MD, Deborra Medina   . Diverticulosis of colon   . DIVERTICULOSIS OF COLON 02/22/2008  . Dysthymia   . 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  . elevated glucose 08/21/2008  . GERD 08/23/2007  . GERD (gastroesophageal reflux disease)   . HAND PAIN 04/09/2010  . History of chicken pox 01/14/2011  . History of measles 01/14/2011  . Hyperglycemia 07/07/2015  . Hypertension   . HYPERTENSION 02/22/2008  . Hypothyroidism   . HYPOTHYROIDISM 08/23/2007  . Incontinence of urine   . Lumbar back pain   . Obesity   . OBESITY 08/23/2007  . Obstructive sleep apnea    does not use CPAP, lost weight  . Other urinary incontinence 03/12/2010  . Peripheral neuropathy 02/12/2011   possibly related to spondylolisthesis per pt  . PONV (postoperative nausea and vomiting)   . Sleep apnea   . SLEEP APNEA, OBSTRUCTIVE 08/23/2007  . Transient memory loss March 2006   after a stressful event she had no memory the rest of that day  . Urinary frequency   . Urinary incontinence   . UTI (lower urinary tract infection) 03/23/2012  . Venous insufficiency   . VENOUS INSUFFICIENCY 08/23/2007  . Vitamin D deficiency   .  VITAMIN D DEFICIENCY 08/21/2008    Tobacco History: Social History   Tobacco Use  Smoking Status Former Smoker  . Packs/day: 1.50  . Years: 30.00  . Pack years: 45.00  . Types: Cigarettes  . Last attempt to quit: 10/13/1991  . Years since quitting: 27.3  Smokeless Tobacco Never Used   Counseling given: Not Answered   Outpatient Encounter Medications as of 02/24/2019  Medication Sig  . albuterol (PROVENTIL HFA;VENTOLIN HFA) 108 (90 Base) MCG/ACT inhaler Inhale 2 puffs into the lungs every 6 (six) hours as needed. For wheezing  . ALPRAZolam (XANAX) 0.5 MG tablet TAKE 1/2 TO 1 TABLET BY MOUTH THREE TIMES DAILY AS NEEDED FOR ANXIETY   . amLODipine (NORVASC) 5 MG tablet TAKE 1 TABLET(5 MG) BY MOUTH DAILY  . benzonatate (TESSALON) 200 MG capsule Take 1 capsule (200 mg total) by mouth 3 (three) times daily as needed for cough.  . budesonide-formoterol (SYMBICORT) 160-4.5 MCG/ACT inhaler Inhale 2 puffs into the lungs 2 (two) times daily.  Marland Kitchen escitalopram (LEXAPRO) 5 MG tablet TAKE 1 TABLET(5 MG) BY MOUTH DAILY  . fluticasone (FLONASE) 50 MCG/ACT nasal spray Place 1 spray into both nostrils daily.  . furosemide (LASIX) 20 MG tablet Take 20 mg by mouth. Take 2 tablet daily as needed  . guaifenesin (MUCUS RELIEF) 400 MG TABS tablet Take 400 mg by mouth 2 (two) times a day.  . ibuprofen (ADVIL,MOTRIN) 200 MG tablet Take 400 mg by mouth every 6 (six) hours as needed for moderate pain.  Marland Kitchen levothyroxine (SYNTHROID, LEVOTHROID) 100 MCG tablet TAKE 1 TABLET BY MOUTH EVERY DAY BEFORE BREAKFAST  . methylPREDNISolone (MEDROL DOSEPAK) 4 MG TBPK tablet 6 day pak-take as directed  . pantoprazole (PROTONIX) 40 MG tablet TAKE 1 TABLET BY MOUTH DAILY 30 MINUTES BEFORE DINNER  . aspirin EC 81 MG tablet Take 81 mg by mouth daily.    . budesonide-formoterol (SYMBICORT) 160-4.5 MCG/ACT inhaler Inhale 2 puffs into the lungs 2 (two) times a day.  . ipratropium-albuterol (DUONEB) 0.5-2.5 (3) MG/3ML SOLN USE 3 ML VIA NEBULIZER FOUR TIMES DAILY (Patient not taking: Reported on 02/24/2019)  . [DISCONTINUED] esomeprazole (NEXIUM) 40 MG capsule Take 40 mg by mouth daily before breakfast.    . [DISCONTINUED] famotidine (PEPCID) 20 MG tablet Take 1 tablet (20 mg total) by mouth at bedtime. (Patient not taking: Reported on 02/24/2019)  . [DISCONTINUED] guaiFENesin (MUCINEX) 600 MG 12 hr tablet Take 600 mg by mouth 4 (four) times daily.   No facility-administered encounter medications on file as of 02/24/2019.      Review of Systems  Review of Systems  Constitutional: Negative.  Negative for chills and fever.  HENT: Negative.   Respiratory: Negative for  cough, shortness of breath and wheezing.   Cardiovascular: Negative.  Negative for chest pain, palpitations and leg swelling.  Gastrointestinal: Negative.   Allergic/Immunologic: Negative.   Neurological: Negative.   Psychiatric/Behavioral: Negative.        Physical Exam  BP 128/72 (BP Location: Left Arm, Patient Position: Sitting, Cuff Size: Large)   Pulse 68   Temp 97.9 F (36.6 C)   Ht 5\' 5"  (1.651 m)   Wt 215 lb 6.4 oz (97.7 kg)   SpO2 97%   BMI 35.84 kg/m   Wt Readings from Last 5 Encounters:  02/24/19 215 lb 6.4 oz (97.7 kg)  11/01/18 213 lb 3.2 oz (96.7 kg)  08/23/18 207 lb 9.6 oz (94.2 kg)  07/06/18 207 lb 3.2 oz (94 kg)  04/21/18 210  lb 9.6 oz (95.5 kg)     Physical Exam Vitals signs and nursing note reviewed.  Constitutional:      General: She is not in acute distress.    Appearance: She is well-developed.  Cardiovascular:     Rate and Rhythm: Normal rate and regular rhythm.  Pulmonary:     Effort: Pulmonary effort is normal. No respiratory distress.     Breath sounds: No wheezing or rhonchi.  Musculoskeletal:        General: No swelling.  Neurological:     Mental Status: She is alert and oriented to person, place, and time.       Assessment & Plan:   COPD mixed type [facility]) This is been a stable interval for patient.  She states that Symbicort and albuterol are working well for her.  COPD with chronic bronchitis. - will continue symbicort and prn albuterol - prn mucinex -stay active   Allergic rhinitis Allergic rhinitis with post nasal drip. - continue nasal irrigation and flonase   GERD Continue nexium     Fenton Foy, NP 02/24/2019

## 2019-02-24 NOTE — Assessment & Plan Note (Signed)
This is been a stable interval for patient.  She states that Symbicort and albuterol are working well for her.  COPD with chronic bronchitis. - will continue symbicort and prn albuterol - prn mucinex -stay active

## 2019-02-24 NOTE — Progress Notes (Signed)
I have discussed the procedure for the virtual visit with the patient who has given consent to proceed with assessment and treatment.   Javanni Maring, CMA     

## 2019-02-24 NOTE — Progress Notes (Signed)
Virtual Visit via Video   I connected with patient on 02/24/19 at  1:00 PM EDT by a video enabled telemedicine application and verified that I am speaking with the correct person using two identifiers.  Location patient: Home Location provider: [facility], Office Persons participating in the virtual visit: Patient, Provider, [place] Romelle Starcher D)  I discussed the limitations of evaluation and management by telemedicine and the availability of in person appointments. The patient expressed understanding and agreed to proceed.  Subjective:   HPI:   Anxiety- chronic problem, on Lexapro 5mg  daily and Alprazolam prn.  Pt reports mood is good.  'the staying in doesn't really effect me'.  'I have to have my xanax.  I have right much on me'.  If pt doesn't take Xanax, breathing worsens.  HTN- chronic problem, on Amlodipine 5mg  daily.  No CP, SOB, HAs, visual changes, edema.  Hypothyroid- chronic problem, on Levothyroxine 178mcg daily.  No excessive fatigue, changes to skin/hair/nails.  ROS:   See pertinent positives and negatives per HPI.  Patient Active Problem List   Diagnosis Date Noted  . Cough 07/06/2018  . Peripheral edema 02/22/2017  . Localized edema 12/17/2016  . Colon cancer screening 06/15/2016  . 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 Other fatigue 11/03/2015  . Hyperglycemia 07/07/2015  . Ceruminosis 03/07/2015  . Left hip pain 04/26/2014  . Acute pain of left knee 04/26/2014  . Annual physical exam 08/06/2013  . Atrophic vaginitis 06/17/2012  . Peripheral vascular disease, unspecified ([place]) 06/02/2012  . Urticaria 10/15/2011  . Anemia 09/21/2011  . 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  . Vitamin D deficiency 02/12/2011  . Peripheral neuropathy 02/12/2011  . Intertrigo 01/21/2011  . Asthma 01/14/2011  . 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 History of chicken pox 01/14/2011  . History of measles 01/14/2011  . HAND PAIN 04/09/2010  . OTHER URINARY  INCONTINENCE 03/12/2010  . Depression with anxiety 03/06/2008  . Essential hypertension 02/22/2008  . Chronic bronchitis ([place]) 02/22/2008  . DIVERTICULOSIS OF COLON 02/22/2008  . Unilateral primary osteoarthritis, left knee 02/22/2008  . Hypothyroidism 08/23/2007  . Obesity 08/23/2007  . Obstructive sleep apnea 08/23/2007  . Venous (peripheral) insufficiency 08/23/2007  . Allergic rhinitis 08/23/2007  . GERD 08/23/2007  . BACK PAIN, LUMBAR 08/23/2007    Social History   Tobacco Use  . Smoking status: Former Smoker    Packs/day: 1.50    Years: 30.00    Pack years: 45.00    Types: Cigarettes    Last attempt to quit: 10/13/1991    Years since quitting: 27.3  . Smokeless tobacco: Never Used  Substance Use Topics  . Alcohol use: No    Alcohol/week: 0.0 standard drinks    Current Outpatient Medications:  .  albuterol (PROVENTIL HFA;VENTOLIN HFA) 108 (90 Base) MCG/ACT inhaler, Inhale 2 puffs into the lungs every 6 (six) hours as needed. For wheezing, Disp: 1 Inhaler, Rfl: 6 .  ALPRAZolam (XANAX) 0.5 MG tablet, TAKE 1/2 TO 1 TABLET BY MOUTH THREE TIMES DAILY AS NEEDED FOR ANXIETY, Disp: 90 tablet, Rfl: 5 .  amLODipine (NORVASC) 5 MG tablet, TAKE 1 TABLET(5 MG) BY MOUTH DAILY, Disp: 90 tablet, Rfl: 0 .  aspirin EC 81 MG tablet, Take 81 mg by mouth daily.  , Disp: , Rfl:  .  benzonatate (TESSALON) 200 MG capsule, Take 1 capsule (200 mg total) by mouth 3 (three) times daily as needed for cough., Disp: 30 capsule, Rfl: 1 .  budesonide-formoterol (SYMBICORT) 160-4.5  MCG/ACT inhaler, Inhale 2 puffs into the lungs 2 (two) times daily., Disp: 2 Inhaler, Rfl: 0 .  budesonide-formoterol (SYMBICORT) 160-4.5 MCG/ACT inhaler, Inhale 2 puffs into the lungs 2 (two) times a day., Disp: 2 Inhaler, Rfl: 0 .  escitalopram (LEXAPRO) 5 MG tablet, TAKE 1 TABLET(5 MG) BY MOUTH DAILY, Disp: 90 tablet, Rfl: 0 .  fluticasone (FLONASE) 50 MCG/ACT nasal spray, Place 1 spray into both nostrils daily., Disp: 16 g,  Rfl: 2 .  furosemide (LASIX) 20 MG tablet, Take 20 mg by mouth. Take 2 tablet daily as needed, Disp: , Rfl:  .  guaifenesin (MUCUS RELIEF) 400 MG TABS tablet, Take 400 mg by mouth 2 (two) times a day., Disp: , Rfl:  .  ibuprofen (ADVIL,MOTRIN) 200 MG tablet, Take 400 mg by mouth every 6 (six) hours as needed for moderate pain., Disp: , Rfl:  .  ipratropium-albuterol (DUONEB) 0.5-2.5 (3) MG/3ML SOLN, USE 3 ML VIA NEBULIZER FOUR TIMES DAILY, Disp: 360 mL, Rfl: 11 .  levothyroxine (SYNTHROID, LEVOTHROID) 100 MCG tablet, TAKE 1 TABLET BY MOUTH EVERY DAY BEFORE BREAKFAST, Disp: 90 tablet, Rfl: 0 .  methylPREDNISolone (MEDROL DOSEPAK) 4 MG TBPK tablet, 6 day pak-take as directed, Disp: 21 tablet, Rfl: 0 .  pantoprazole (PROTONIX) 40 MG tablet, TAKE 1 TABLET BY MOUTH DAILY 30 MINUTES BEFORE DINNER, Disp: 30 tablet, Rfl: 11  Allergies  Allergen Reactions  . Prednisone Other (See Comments)    Confusion, dizziness Medrol is ok  . Codeine Nausea Only       . Tramadol Itching    Objective:   There were no vitals taken for this visit.  AAOx3, NAD NCAT, EOMI No obvious CN deficits Coloring WNL Pt is able to speak clearly, coherently without shortness of breath or increased work of breathing.  Thought process is linear.  Mood is appropriate.   Assessment and Plan:   HTN- chronic problem.  Well controlled.  Currently asymptomatic.  Will continue to follow.  Anxiety- chronic problem.  Currently doing well.  Pulm will no longer fill Alprazolam (previous MD retired) and she now requires me to fill this.  Prescription sent.  Hypothyroid- currently asymptomatic.  Check labs.  Adjust meds prn    [name], MD 02/24/2019

## 2019-02-24 NOTE — Assessment & Plan Note (Signed)
Allergic rhinitis with post nasal drip. - continue nasal irrigation and flonase

## 2019-02-24 NOTE — Assessment & Plan Note (Signed)
Continue nexium 

## 2019-02-24 NOTE — Patient Instructions (Addendum)
COPD with chronic bronchitis. - will continue symbicort and prn albuterol - prn mucinex -stay active  Allergic rhinitis with post nasal drip. - continue nasal irrigation and flonase  Obstructive sleep apnea. - she reports benefit from CPAP - advised her to use this whenever she is asleep to get maximum benefit  GERD. - pepcid, nexium per PCP   Follow up: Follow up with Dr. Halford Chessman in 6 months or sooner if needed

## 2019-03-02 ENCOUNTER — Other Ambulatory Visit (INDEPENDENT_AMBULATORY_CARE_PROVIDER_SITE_OTHER): Payer: Medicare HMO

## 2019-03-02 ENCOUNTER — Other Ambulatory Visit: Payer: Self-pay

## 2019-03-02 DIAGNOSIS — I10 Essential (primary) hypertension: Secondary | ICD-10-CM | POA: Diagnosis not present

## 2019-03-02 DIAGNOSIS — E039 Hypothyroidism, unspecified: Secondary | ICD-10-CM

## 2019-03-02 LAB — LIPID PANEL
Cholesterol: 130 mg/dL (ref 0–200)
HDL: 44 mg/dL (ref >39.00)
LDL Cholesterol: 56 mg/dL (ref 0–99)
NonHDL: 86.23
Total CHOL/HDL Ratio: 3
Triglycerides: 149 mg/dL (ref 0.0–149.0)
VLDL: 29.8 mg/dL (ref 0.0–40.0)

## 2019-03-02 LAB — CBC WITH DIFFERENTIAL/PLATELET
Basophils Absolute: 0 10*3/uL (ref 0.0–0.1)
Basophils Relative: 0.3 % (ref 0.0–3.0)
Eosinophils Absolute: 0.5 10*3/uL (ref 0.0–0.7)
Eosinophils Relative: 7.8 % — ABNORMAL HIGH (ref 0.0–5.0)
HCT: 42 % (ref 36.0–46.0)
Hemoglobin: 14.2 g/dL (ref 12.0–15.0)
Lymphocytes Relative: 28 % (ref 12.0–46.0)
Lymphs Abs: 1.9 10*3/uL (ref 0.7–4.0)
MCHC: 33.8 g/dL (ref 30.0–36.0)
MCV: 89.4 fl (ref 78.0–100.0)
Monocytes Absolute: 0.6 10*3/uL (ref 0.1–1.0)
Monocytes Relative: 8 % (ref 3.0–12.0)
Neutro Abs: 3.9 10*3/uL (ref 1.4–7.7)
Neutrophils Relative %: 55.9 % (ref 43.0–77.0)
Platelets: 261 10*3/uL (ref 150.0–400.0)
RBC: 4.7 Mil/uL (ref 3.87–5.11)
RDW: 13.8 % (ref 11.5–15.5)
WBC: 7 10*3/uL (ref 4.0–10.5)

## 2019-03-02 LAB — HEPATIC FUNCTION PANEL
ALT: 8 U/L (ref 0–35)
AST: 13 U/L (ref 0–37)
Albumin: 3.7 g/dL (ref 3.5–5.2)
Alkaline Phosphatase: 73 U/L (ref 39–117)
Bilirubin, Direct: 0.1 mg/dL (ref 0.0–0.3)
Total Bilirubin: 0.4 mg/dL (ref 0.2–1.2)
Total Protein: 6.3 g/dL (ref 6.0–8.3)

## 2019-03-02 LAB — BASIC METABOLIC PANEL
BUN: 12 mg/dL (ref 6–23)
CO2: 30 mEq/L (ref 19–32)
Calcium: 8.9 mg/dL (ref 8.4–10.5)
Chloride: 104 mEq/L (ref 96–112)
Creatinine, Ser: 0.85 mg/dL (ref 0.40–1.20)
GFR: 64.3 mL/min (ref >60.00)
Glucose, Bld: 91 mg/dL (ref 70–99)
Potassium: 5 mEq/L (ref 3.5–5.1)
Sodium: 139 mEq/L (ref 135–145)

## 2019-03-02 LAB — TSH: TSH: 4.28 u[IU]/mL (ref 0.35–4.50)

## 2019-03-14 DIAGNOSIS — K219 Gastro-esophageal reflux disease without esophagitis: Secondary | ICD-10-CM | POA: Diagnosis not present

## 2019-03-14 DIAGNOSIS — G8929 Other chronic pain: Secondary | ICD-10-CM | POA: Diagnosis not present

## 2019-03-14 DIAGNOSIS — R69 Illness, unspecified: Secondary | ICD-10-CM | POA: Diagnosis not present

## 2019-03-14 DIAGNOSIS — J309 Allergic rhinitis, unspecified: Secondary | ICD-10-CM | POA: Diagnosis not present

## 2019-03-14 DIAGNOSIS — E039 Hypothyroidism, unspecified: Secondary | ICD-10-CM | POA: Diagnosis not present

## 2019-03-14 DIAGNOSIS — J449 Chronic obstructive pulmonary disease, unspecified: Secondary | ICD-10-CM | POA: Diagnosis not present

## 2019-03-14 DIAGNOSIS — G4733 Obstructive sleep apnea (adult) (pediatric): Secondary | ICD-10-CM | POA: Diagnosis not present

## 2019-03-14 DIAGNOSIS — I10 Essential (primary) hypertension: Secondary | ICD-10-CM | POA: Diagnosis not present

## 2019-04-19 ENCOUNTER — Other Ambulatory Visit: Payer: Self-pay | Admitting: General Practice

## 2019-04-19 MED ORDER — ALPRAZOLAM 0.5 MG PO TABS: ORAL_TABLET | ORAL | 1 refills | Status: DC

## 2019-04-19 MED ORDER — LEVOTHYROXINE SODIUM 100 MCG PO TABS: ORAL_TABLET | ORAL | 0 refills | Status: DC

## 2019-04-19 NOTE — Telephone Encounter (Signed)
Last OV 02/24/19 Alprazolam last filled 02/24/19 #90 with 1 Levothyroxine last filled 02/24/19 #90 with 0

## 2019-05-22 ENCOUNTER — Other Ambulatory Visit: Payer: Self-pay | Admitting: General Practice

## 2019-05-22 MED ORDER — GABAPENTIN 300 MG PO CAPS: ORAL_CAPSULE | ORAL | 0 refills | Status: DC

## 2019-05-22 NOTE — Telephone Encounter (Signed)
Please advise refill request received for gabapentin. Per pt chart this was last filled 02/16/18 #270 with 0

## 2019-05-23 ENCOUNTER — Other Ambulatory Visit: Payer: Self-pay | Admitting: Family Medicine

## 2019-05-23 NOTE — Progress Notes (Signed)
Reviewed and agree with assessment/plan.   Matsue Strom, MD [facility] 10/07/2016, 12:24 PM Pager:  [phone]  

## 2019-06-21 DIAGNOSIS — H2513 Age-related nuclear cataract, bilateral: Secondary | ICD-10-CM | POA: Diagnosis not present

## 2019-06-26 ENCOUNTER — Other Ambulatory Visit: Payer: Self-pay | Admitting: Family Medicine

## 2019-06-26 NOTE — Telephone Encounter (Signed)
Last OV 02/24/19 Alprazolam last filled 04/19/19 #90 with 1

## 2019-07-06 DIAGNOSIS — H25043 Posterior subcapsular polar age-related cataract, bilateral: Secondary | ICD-10-CM | POA: Diagnosis not present

## 2019-07-06 DIAGNOSIS — H18413 Arcus senilis, bilateral: Secondary | ICD-10-CM | POA: Diagnosis not present

## 2019-07-06 DIAGNOSIS — H2513 Age-related nuclear cataract, bilateral: Secondary | ICD-10-CM | POA: Diagnosis not present

## 2019-07-06 DIAGNOSIS — H25013 Cortical age-related cataract, bilateral: Secondary | ICD-10-CM | POA: Diagnosis not present

## 2019-07-12 ENCOUNTER — Other Ambulatory Visit: Payer: Self-pay | Admitting: Family Medicine

## 2019-07-28 DIAGNOSIS — H2512 Age-related nuclear cataract, left eye: Secondary | ICD-10-CM | POA: Diagnosis not present

## 2019-07-28 DIAGNOSIS — H2513 Age-related nuclear cataract, bilateral: Secondary | ICD-10-CM | POA: Diagnosis not present

## 2019-07-28 DIAGNOSIS — H2511 Age-related nuclear cataract, right eye: Secondary | ICD-10-CM | POA: Diagnosis not present

## 2019-08-02 DIAGNOSIS — R69 Illness, unspecified: Secondary | ICD-10-CM | POA: Diagnosis not present

## 2019-08-22 ENCOUNTER — Encounter: Payer: Self-pay | Admitting: Pulmonary Disease

## 2019-08-22 ENCOUNTER — Other Ambulatory Visit: Payer: Self-pay

## 2019-08-22 ENCOUNTER — Ambulatory Visit: Payer: Medicare HMO | Admitting: Pulmonary Disease

## 2019-08-22 VITALS — BP 134/82 | HR 70 | Temp 97.1°F | Ht 65.0 in | Wt 215.0 lb

## 2019-08-22 DIAGNOSIS — G4730 Sleep apnea, unspecified: Secondary | ICD-10-CM

## 2019-08-22 DIAGNOSIS — E669 Obesity, unspecified: Secondary | ICD-10-CM

## 2019-08-22 DIAGNOSIS — J309 Allergic rhinitis, unspecified: Secondary | ICD-10-CM | POA: Diagnosis not present

## 2019-08-22 DIAGNOSIS — G4733 Obstructive sleep apnea (adult) (pediatric): Secondary | ICD-10-CM

## 2019-08-22 DIAGNOSIS — J449 Chronic obstructive pulmonary disease, unspecified: Secondary | ICD-10-CM

## 2019-08-22 MED ORDER — BUDESONIDE-FORMOTEROL FUMARATE 160-4.5 MCG/ACT IN AERO: 2.0000 | INHALATION_SPRAY | Freq: Two times a day (BID) | RESPIRATORY_TRACT | 0 refills | Status: DC

## 2019-08-22 MED ORDER — ALBUTEROL SULFATE HFA 108 (90 BASE) MCG/ACT IN AERS: 2.0000 | INHALATION_SPRAY | Freq: Four times a day (QID) | RESPIRATORY_TRACT | 5 refills | Status: DC | PRN

## 2019-08-22 MED ORDER — METHYLPREDNISOLONE 4 MG PO TBPK: ORAL_TABLET | ORAL | 0 refills | Status: DC

## 2019-08-22 NOTE — Progress Notes (Signed)
[facility]  Chief Complaint  Patient presents with  . COPD mixed type, Obstructive Sleep Apnea    Had to use nebulizer medications about 5 times since last visit.    Constitutional:  BP 134/82 (BP Location: Left Arm, Patient Position: Sitting, Cuff Size: Large)   Pulse 70   Temp (!) 97.1 F (36.2 C)   Ht 5\' 5"  (1.651 m)   Wt 215 lb (97.5 kg)   SpO2 99% Comment: on room air  BMI 35.78 kg/m   Past Medical History:  Vit D deficiency, Neuropathy, Low back pain, Hypothyroidism, HTN, GERD, Diverticulosis, Depression, Anxiety, DJD  Brief Summary:  Jenny [name] is a 78 y.o. female former smoker with OSA, and COPD with chronic bronchitis.  She has used medrol about 8 times over the past 3 months.  She gets chest congestion, wheeze, and cough.  This will happen in the morning.  She then takes a single pill of medrol and feels better.  She hasn't been using ventolin or her nebulizer consistently during these situations.  She feels that symbicort is working well.  No issues with CPAP mask or pressure.  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  She is not having sinus congestion, sore throat, or dry mouth.  She has been getting more soreness in her shoulders and has appointment with orthopedics to assess further.   Physical Exam:   Appearance - well kempt   ENMT - no sinus tenderness, no nasal discharge, no oral exudate  Neck - no masses, trachea midline, no thyromegaly, no elevation in JVP  Respiratory - normal appearance of chest wall, normal respiratory effort w/o accessory muscle use, no dullness on percussion, no wheezing or rales  CV - s1s2 regular rate and rhythm, no murmurs, no peripheral edema, radial pulses symmetric  GI - soft, non tender  Lymph - no adenopathy noted in neck and axillary areas  MSK - normal gait  Ext - no cyanosis, clubbing, or joint  inflammation noted  Skin - no rashes, lesions, or ulcers  Neuro - normal strength, oriented x 3  Psych - normal mood and affect  Assessment/Plan:   COPD with chronic bronchitis. - singulair caused nightmares - continue symbicort - advised her to try using ventolin or her nebulizer first when her symptoms flare up; only if her symptoms persist should she then try medrol and call the office - prn tessalon, mucinex  Allergic rhinitis with post nasal drip. - prn mucinex, flonase  Obstructive sleep apnea. - she is reports benefit from CPAP use - continue auto CPAP  GERD. - pepcid, nexium per PCP  Obesity. - discussed importance of weight loss   Patient Instructions  Try using ventolin or your nebulizer first when you have chest congestion, cough, wheezing, or feel short of breath.  If your symptoms don't improve after this, then you can take a dose of medrol.  Please call the office to inform us of when you need to use medrol.  Follow up in 6 months     Chesley Mires, MD [place] Pager: [phone] 08/22/2019, 9:27 AM  Flow Sheet     Pulmonary tests:  Spirometry 04/21/18 >> FEV1 1.3 (64%), FEV1% 83  Sleep tests:  PSG April 2008 >> AHI 29, SpO2 low 62%  Cardiac tests:  Echo 03/16/17 >> EF 60 to 65%  Medications:   Allergies as of 08/22/2019  Reactions   Prednisone Other (See Comments)   Confusion, dizziness Medrol is ok   Codeine Nausea Only       Tramadol Itching      Medication List       Accurate as of August 22, 2019  9:27 AM. If you have any questions, ask your nurse or doctor.        STOP taking these medications   aspirin EC 81 MG tablet Stopped by: Chesley Mires, MD     TAKE these medications   albuterol 108 (90 Base) MCG/ACT inhaler Commonly known as: VENTOLIN HFA Inhale 2 puffs into the lungs every 6 (six) hours as needed. For wheezing   ALPRAZolam 0.5 MG tablet Commonly known as: XANAX TAKE 1/2 TO 1  TABLET BY MOUTH THREE TIMES DAILY AS NEEDED FOR ANXIETY   amLODipine 5 MG tablet Commonly known as: NORVASC TAKE 1 TABLET(5 MG) BY MOUTH DAILY   benzonatate 200 MG capsule Commonly known as: TESSALON Take 1 capsule (200 mg total) by mouth 3 (three) times daily as needed for cough.   Besivance 0.6 % Susp Generic drug: Besifloxacin HCl Place 1 drop into the left eye 3 (three) times daily.   budesonide-formoterol 160-4.5 MCG/ACT inhaler Commonly known as: Symbicort Inhale 2 puffs into the lungs 2 (two) times a day.   budesonide-formoterol 160-4.5 MCG/ACT inhaler Commonly known as: Symbicort Inhale 2 puffs into the lungs 2 (two) times daily.   Durezol 0.05 % Emul Generic drug: Difluprednate Place 1 drop into the left eye 3 (three) times daily.   escitalopram 5 MG tablet Commonly known as: LEXAPRO TAKE 1 TABLET(5 MG) BY MOUTH DAILY   fluticasone 50 MCG/ACT nasal spray Commonly known as: FLONASE Place 1 spray into both nostrils daily.   furosemide 20 MG tablet Commonly known as: LASIX Take 20 mg by mouth. Take 2 tablet daily as needed   gabapentin 300 MG capsule Commonly known as: NEURONTIN TAKE 1 CAPSULE(300 MG) BY MOUTH THREE TIMES DAILY   ibuprofen 200 MG tablet Commonly known as: ADVIL Take 400 mg by mouth every 6 (six) hours as needed for moderate pain.   ipratropium-albuterol 0.5-2.5 (3) MG/3ML Soln Commonly known as: DUONEB USE 3 ML VIA NEBULIZER FOUR TIMES DAILY   levothyroxine 100 MCG tablet Commonly known as: SYNTHROID TAKE 1 TABLET BY MOUTH EVERY DAY BEFORE BREAKFAST   methylPREDNISolone 4 MG Tbpk tablet Commonly known as: MEDROL DOSEPAK 6 day pak-take as directed   Mucus Relief 400 MG Tabs tablet Generic drug: guaifenesin Take 400 mg by mouth 2 (two) times a day.   pantoprazole 40 MG tablet Commonly known as: PROTONIX TAKE 1 TABLET BY MOUTH DAILY 30 MINUTES BEFORE DINNER   Prolensa 0.07 % Soln Generic drug: Bromfenac Sodium Place 1 drop into  the left eye at bedtime.       Past Surgical History:  She  has a past surgical history that includes Appendectomy; Tonsillectomy and adenoidectomy; AP repair and sling for cystocele (8-04 ); left hand (2010); ctr; Shoulder arthroscopy distal clavicle excision and open rotator cuff repair; Neck surgery; Abdominal hysterectomy; Cholecystectomy; carpal tunel (2008); and Tubal ligation.  Family History:  Her family history includes Anxiety disorder in her daughter; Arthritis in her brother and mother; Asthma in her daughter; COPD in her mother; Cancer in her maternal aunt, maternal aunt, and paternal grandmother; Colon polyps in her daughter; Dementia in her father; Heart disease in her maternal grandfather, maternal grandmother, and mother; Hypertension in her mother; Kidney disease in  her paternal grandfather; Other in her maternal grandfather, mother, and paternal grandfather; Pneumonia in her mother; Stroke in her maternal grandmother.  Social History:  She  reports that she quit smoking about 27 years ago. Her smoking use included cigarettes. She has a 45.00 pack-year smoking history. She has never used smokeless tobacco. She reports that she does not drink alcohol or use drugs.

## 2019-08-22 NOTE — Patient Instructions (Signed)
Try using ventolin or your nebulizer first when you have chest congestion, cough, wheezing, or feel short of breath.  If your symptoms don't improve after this, then you can take a dose of medrol.  Please call the office to inform us of when you need to use medrol.  Follow up in 6 months

## 2019-08-23 ENCOUNTER — Ambulatory Visit: Payer: Medicare HMO | Admitting: Pulmonary Disease

## 2019-08-29 ENCOUNTER — Other Ambulatory Visit: Payer: Self-pay | Admitting: General Practice

## 2019-08-29 MED ORDER — AMLODIPINE BESYLATE 5 MG PO TABS: 5.0000 mg | ORAL_TABLET | Freq: Every day | ORAL | 0 refills | Status: DC

## 2019-08-31 ENCOUNTER — Other Ambulatory Visit: Payer: Self-pay | Admitting: Family Medicine

## 2019-08-31 NOTE — Telephone Encounter (Signed)
Last OV 02/24/19 Alprazolam last filled 06/27/19 #90 with 1

## 2019-09-01 DIAGNOSIS — H2513 Age-related nuclear cataract, bilateral: Secondary | ICD-10-CM | POA: Diagnosis not present

## 2019-09-01 DIAGNOSIS — H2511 Age-related nuclear cataract, right eye: Secondary | ICD-10-CM | POA: Diagnosis not present

## 2019-09-11 ENCOUNTER — Other Ambulatory Visit: Payer: Self-pay | Admitting: Family Medicine

## 2019-10-03 ENCOUNTER — Other Ambulatory Visit: Payer: Self-pay | Admitting: Pulmonary Disease

## 2019-10-04 ENCOUNTER — Ambulatory Visit (INDEPENDENT_AMBULATORY_CARE_PROVIDER_SITE_OTHER): Payer: Medicare HMO | Admitting: Primary Care

## 2019-10-04 ENCOUNTER — Other Ambulatory Visit: Payer: Self-pay

## 2019-10-04 ENCOUNTER — Telehealth: Payer: Self-pay | Admitting: Pulmonary Disease

## 2019-10-04 ENCOUNTER — Other Ambulatory Visit: Payer: Self-pay | Admitting: Family Medicine

## 2019-10-04 ENCOUNTER — Encounter: Payer: Self-pay | Admitting: Primary Care

## 2019-10-04 DIAGNOSIS — J441 Chronic obstructive pulmonary disease with (acute) exacerbation: Secondary | ICD-10-CM | POA: Diagnosis not present

## 2019-10-04 MED ORDER — METHYLPREDNISOLONE 4 MG PO TBPK: ORAL_TABLET | ORAL | 0 refills | Status: DC

## 2019-10-04 MED ORDER — DOXYCYCLINE HYCLATE 100 MG PO TABS: 100.0000 mg | ORAL_TABLET | Freq: Two times a day (BID) | ORAL | 0 refills | Status: DC

## 2019-10-04 NOTE — Telephone Encounter (Signed)
Called and spoke with pt letting her know that VS said to schedule visit. Pt verbalized understanding. Pt has been scheduled for televisit today at 4:30 with Beth. Nothing further needed.

## 2019-10-04 NOTE — Telephone Encounter (Signed)
Called and spoke with pt. Pt said she began to have issues with breathing x4 days now to where she has had to use her rescue inhaler. Pt has had to use her rescue inhaler at least three times daily and also has had to use her nebulizer at least three times daily. Pt also has been using her Symbicort.  Pt has had complaints of wheezing and is also coughing up green phlegm. Pt denies any complaints of fever but has not actually checked her fever but says she does not feel feverish.  Pt also has tightness in her chest.   Pt said she does have a medrol pack that was given to her for emergencies if she had to do that. Pt wants recommendations to help with her symptoms. Dr. Halford Chessman, please advise. Thanks!

## 2019-10-04 NOTE — Patient Instructions (Addendum)
COPD exacerbation - Continue Symbicort 2 puffs twice daily - Continue DuoNeb every 6 hours as needed for breakthrough shortness of breath or wheezing - Rx doxycycline 1 tab twice daily x7 days  - Refilling Medrol dose pack - Continue over-the-counter mucus relief twice daily - Monitor for fever or low temperature - Notify office if symptoms do not improve or worsen

## 2019-10-04 NOTE — Telephone Encounter (Signed)
Please schedule her for tele visit with NP.

## 2019-10-04 NOTE — Progress Notes (Signed)
Virtual Visit via Telephone Note  I connected with Jenny [name] on 10/04/19 at  4:30 PM EST by telephone and verified that I am speaking with the correct person using two identifiers.  Location: Patient: Home Provider: Office   I discussed the limitations, risks, security and privacy concerns of performing an evaluation and management service by telephone and the availability of in person appointments. I also discussed with the patient that there may be a patient responsible charge related to this service. The patient expressed understanding and agreed to proceed.   History of Present Illness: 78 year old female, former smoker quit 1993 (45 pack year hx). PMH COPD mixed type, OSA (CPAP), allergic rhinitis, chronic bronchitis, GERD, HTN. Former patient of Dr. Lenna Gilford, being followed by Dr. Halford Chessman. Maintained on Symbicort 160, prn albuterol hfa and duoneb.   10/04/2019 Patient contacted today for acute televisit.  Reports productive cough with green mucus and associated wheezing x 4 days. Complaint with Symbicort 160, taking two puffs twice daily. She has been using her duoneb x3/day this last week with some improvement.  She has been taking over-the-counter mucus relief.  She did not finish Medrol dose pack when last prescribed in November by Dr. Halford Chessman. No recent antibiotic use. No known sick contacts. Denies fever, chills, N/V/D.       Observations/Objective:  - No significant shortness of breath, wheezing or cough noted during phone conversation - Able to speak in full sentences  Spirometry 04/21/18 >> FEV1 1.3 (64%), FEV1% 83  Assessment and Plan:  COPD exacerbation - Continue Symbicort 2 puffs twice daily - Continue DuoNeb every 6 hours as needed for breakthrough shortness of breath or wheezing - Rx doxycycline 1 tab twice daily x7 days  - Refilling Medrol dose pack - Continue over-the-counter mucus relief twice daily  Follow Up Instructions:  - Notify office if symptoms do not  improve or worsen  I discussed the assessment and treatment plan with the patient. The patient was provided an opportunity to ask questions and all were answered. The patient agreed with the plan and demonstrated an understanding of the instructions.   The patient was advised to call back or seek an in-person evaluation if the symptoms worsen or if the condition fails to improve as anticipated.  I provided 22 minutes of non-face-to-face time during this encounter.   Martyn Ehrich, NP

## 2019-10-24 DIAGNOSIS — M48062 Spinal stenosis, lumbar region with neurogenic claudication: Secondary | ICD-10-CM | POA: Diagnosis not present

## 2019-10-24 DIAGNOSIS — Z0100 Encounter for examination of eyes and vision without abnormal findings: Secondary | ICD-10-CM | POA: Diagnosis not present

## 2019-10-24 DIAGNOSIS — Z961 Presence of intraocular lens: Secondary | ICD-10-CM | POA: Diagnosis not present

## 2019-10-24 DIAGNOSIS — M5417 Radiculopathy, lumbosacral region: Secondary | ICD-10-CM | POA: Diagnosis not present

## 2019-10-25 ENCOUNTER — Telehealth: Payer: Self-pay | Admitting: Adult Health

## 2019-10-25 NOTE — Telephone Encounter (Signed)
Verbal from TP: looks like this has been ongoing since October 2020.  Okay for her to come in to the office to be seen.  Thanks.

## 2019-10-25 NOTE — Telephone Encounter (Signed)
Patient was scheduled for face-2-face appt with TP for tomorrow.  Will route to TP to ensure this is okay.

## 2019-10-25 NOTE — Telephone Encounter (Signed)
Spoke with the pt  She is c/o cough and wheezing since end of Oct 2020  She states she produces moderate amounts of green sputum  She has not had any fevers, chills, body aches, loss of taste/smell, diarrhea No recent travel or sick contacts  She had virtual ov 10/04/19  Wants to know if she can come into the office  VS- please advise if this is ok, thanks!

## 2019-10-25 NOTE — Telephone Encounter (Signed)
lmtcb for pt.  

## 2019-10-26 ENCOUNTER — Encounter: Payer: Self-pay | Admitting: Adult Health

## 2019-10-26 ENCOUNTER — Ambulatory Visit (INDEPENDENT_AMBULATORY_CARE_PROVIDER_SITE_OTHER): Payer: Medicare HMO

## 2019-10-26 ENCOUNTER — Other Ambulatory Visit: Payer: Self-pay

## 2019-10-26 ENCOUNTER — Ambulatory Visit: Payer: Medicare HMO | Admitting: Adult Health

## 2019-10-26 VITALS — BP 118/64 | HR 80 | Temp 97.1°F | Ht 65.0 in | Wt 218.0 lb

## 2019-10-26 DIAGNOSIS — R059 Cough, unspecified: Secondary | ICD-10-CM

## 2019-10-26 DIAGNOSIS — J449 Chronic obstructive pulmonary disease, unspecified: Secondary | ICD-10-CM

## 2019-10-26 DIAGNOSIS — J441 Chronic obstructive pulmonary disease with (acute) exacerbation: Secondary | ICD-10-CM

## 2019-10-26 DIAGNOSIS — R05 Cough: Secondary | ICD-10-CM | POA: Diagnosis not present

## 2019-10-26 DIAGNOSIS — J309 Allergic rhinitis, unspecified: Secondary | ICD-10-CM

## 2019-10-26 MED ORDER — AZITHROMYCIN 250 MG PO TABS: ORAL_TABLET | ORAL | 0 refills | Status: AC

## 2019-10-26 MED ORDER — BENZONATATE 200 MG PO CAPS: 200.0000 mg | ORAL_CAPSULE | Freq: Three times a day (TID) | ORAL | 3 refills | Status: DC | PRN

## 2019-10-26 NOTE — Progress Notes (Signed)
@Patient  ID: Jenny [name], female    DOB: 09-Feb-1941, 78 y.o.   MRN: [id number]  Chief Complaint  Patient presents with  . Follow-up    COPD     Referring provider: Midge Minium, MD  HPI: 79 year old female former smoker quit 1993 followed for COPD, allergic rhinitis.  TEST/EVENTS :  Spirometry 04/21/18 >> FEV1 1.3 (64%), FEV1% 83  10/26/2019 Acute OV : Cough , COPD  Patient presents for an acute office visit.  Patient complains over the last 3 months she has had a persistent cough, congestion wheezing and shortness of breath.  She says symptoms have been worse for the last month.  She was seen 3 weeks ago and treated for COPD exacerbation.  She was given doxycycline for 7 days and a Medrol Dosepak.  Patient says she does get better but symptoms never totally go away and when she gets off of the medication they start to increase more.  She denies any hemoptysis, chest pain, orthopnea, edema.  She is had no fever loss of taste or smell.  And no known sick contacts.    Allergies  Allergen Reactions  . Prednisone Other (See Comments)    Confusion, dizziness Medrol is ok  . Codeine Nausea Only       . Tramadol Itching    Immunization History  Administered Date(s) Administered  . Fluad Quad(high Dose 65+) 08/02/2019  . Influenza Split 08/20/2011, 09/30/2012  . Influenza Whole 08/13/2013  . Influenza, High Dose Seasonal PF 10/13/2014, 09/03/2015, 06/03/2017, 09/25/2018  . Influenza-Unspecified 07/11/2016  . Pneumococcal Conjugate-13 03/16/2014  . Pneumococcal Polysaccharide-23 06/23/2016  . Tdap 09/22/2011  . Zoster 03/16/2014    Past Medical History:  Diagnosis Date  . ALLERGIC RHINITIS 08/23/2007  . Allergy    rhinitis  . 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 Anemia 09/21/2011  . Annual physical exam 08/06/2013  . Asthma    mild, intermittent  . Asthma 01/14/2011  . Atrophic vaginitis 06/17/2012  . BACK PAIN, LUMBAR 08/23/2007  . Bronchitis   . BRONCHITIS,  RECURRENT 02/22/2008  . Cough    usually not productive  . Degenerative joint disease   . DEGENERATIVE JOINT DISEASE 02/22/2008  . Depression with anxiety 03/06/2008   Qualifier: Diagnosis of  By: Lenna Gilford MD, Deborra Medina   . Diverticulosis of colon   . DIVERTICULOSIS OF COLON 02/22/2008  . Dysthymia   . 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  . elevated glucose 08/21/2008  . GERD 08/23/2007  . GERD (gastroesophageal reflux disease)   . HAND PAIN 04/09/2010  . History of chicken pox 01/14/2011  . History of measles 01/14/2011  . Hyperglycemia 07/07/2015  . Hypertension   . HYPERTENSION 02/22/2008  . Hypothyroidism   . HYPOTHYROIDISM 08/23/2007  . Incontinence of urine   . Lumbar back pain   . Obesity   . OBESITY 08/23/2007  . Obstructive sleep apnea    does not use CPAP, lost weight  . Other urinary incontinence 03/12/2010  . Peripheral neuropathy 02/12/2011   possibly related to spondylolisthesis per pt  . PONV (postoperative nausea and vomiting)   . Sleep apnea   . SLEEP APNEA, OBSTRUCTIVE 08/23/2007  . Transient memory loss March 2006   after a stressful event she had no memory the rest of that day  . Urinary frequency   . Urinary incontinence   . UTI (lower urinary tract infection) 03/23/2012  . Venous insufficiency   . VENOUS INSUFFICIENCY 08/23/2007  . Vitamin D deficiency   . VITAMIN  D DEFICIENCY 08/21/2008    Tobacco History: Social History   Tobacco Use  Smoking Status Former Smoker  . Packs/day: 1.50  . Years: 30.00  . Pack years: 45.00  . Types: Cigarettes  . Quit date: 10/13/1991  . Years since quitting: 28.0  Smokeless Tobacco Never Used   Counseling given: Not Answered   Outpatient Medications Prior to Visit  Medication Sig Dispense Refill  . albuterol (VENTOLIN HFA) 108 (90 Base) MCG/ACT inhaler Inhale 2 puffs into the lungs every 6 (six) hours as needed. For wheezing 6.7 g 5  . ALPRAZolam (XANAX) 0.5 MG tablet TAKE 1/2 TO 1 TABLET BY MOUTH THREE TIMES DAILY AS NEEDED FOR ANXIETY  90 tablet 1  . amLODipine (NORVASC) 5 MG tablet TAKE 1 TABLET BY MOUTH EVERY DAY 90 tablet 0  . BESIVANCE 0.6 % SUSP Place 1 drop into the left eye 3 (three) times daily.    . budesonide-formoterol (SYMBICORT) 160-4.5 MCG/ACT inhaler Inhale 2 puffs into the lungs 2 (two) times daily. 1 Inhaler 0  . DUREZOL 0.05 % EMUL Place 1 drop into the left eye 3 (three) times daily.    Marland Kitchen escitalopram (LEXAPRO) 5 MG tablet TAKE 1 TABLET(5 MG) BY MOUTH DAILY 90 tablet 0  . fluticasone (FLONASE) 50 MCG/ACT nasal spray Place 1 spray into both nostrils daily. 16 g 2  . furosemide (LASIX) 20 MG tablet Take 20 mg by mouth. Take 2 tablet daily as needed    . gabapentin (NEURONTIN) 300 MG capsule TAKE 1 CAPSULE(300 MG) BY MOUTH THREE TIMES DAILY 270 capsule 0  . guaifenesin (MUCUS RELIEF) 400 MG TABS tablet Take 400 mg by mouth 2 (two) times a day.    . ibuprofen (ADVIL,MOTRIN) 200 MG tablet Take 400 mg by mouth every 6 (six) hours as needed for moderate pain.    Marland Kitchen ipratropium-albuterol (DUONEB) 0.5-2.5 (3) MG/3ML SOLN USE 3 ML VIA NEBULIZER FOUR TIMES DAILY 360 mL 11  . levothyroxine (SYNTHROID) 100 MCG tablet TAKE 1 TABLET BY MOUTH EVERY DAY BEFORE BREAKFAST 90 tablet 0  . pantoprazole (PROTONIX) 40 MG tablet TAKE 1 TABLET BY MOUTH EVERY DAY 30 MINUTES BEFORE DINNER 90 tablet 1  . PROLENSA 0.07 % SOLN Place 1 drop into the left eye at bedtime.    . benzonatate (TESSALON) 200 MG capsule Take 1 capsule (200 mg total) by mouth 3 (three) times daily as needed for cough. 30 capsule 1  . budesonide-formoterol (SYMBICORT) 160-4.5 MCG/ACT inhaler Inhale 2 puffs into the lungs 2 (two) times a day. 2 Inhaler 0  . doxycycline (VIBRA-TABS) 100 MG tablet Take 1 tablet (100 mg total) by mouth 2 (two) times daily. (Patient not taking: Reported on 10/26/2019) 14 tablet 0  . methylPREDNISolone (MEDROL DOSEPAK) 4 MG TBPK tablet 6 day pak-take as directed (Patient not taking: Reported on 10/26/2019) 21 tablet 0   No  facility-administered medications prior to visit.     Review of Systems:   Constitutional:   No  weight loss, night sweats,  Fevers, chills, + fatigue, or  lassitude.  HEENT:   No headaches,  Difficulty swallowing,  Tooth/dental problems, or  Sore throat,                No sneezing, itching, ear ache, + nasal congestion, post nasal drip,   CV:  No chest pain,  Orthopnea, PND, swelling in lower extremities, anasarca, dizziness, palpitations, syncope.   GI  No heartburn, indigestion, abdominal pain, nausea, vomiting, diarrhea, change in bowel habits,  loss of appetite, bloody stools.   Resp:   No chest wall deformity  Skin: no rash or lesions.  GU: no dysuria, change in color of urine, no urgency or frequency.  No flank pain, no hematuria   MS:  No joint pain or swelling.  No decreased range of motion.  No back pain.    Physical Exam  BP 118/64 (BP Location: Left Arm, Cuff Size: Large)   Pulse 80   Temp (!) 97.1 F (36.2 C) (Temporal)   Ht 5\' 5"  (1.651 m)   Wt 218 lb (98.9 kg)   SpO2 96% Comment: RA  BMI 36.28 kg/m   GEN: A/Ox3; pleasant , NAD, BMI 36   HEENT:  [place]/AT,   , NOSE-clear, THROAT-clear, no lesions, no postnasal drip or exudate noted.   NECK:  Supple w/ fair ROM; no JVD; normal carotid impulses w/o bruits; no thyromegaly or nodules palpated; no lymphadenopathy.    RESP few scattered rhonchi no accessory muscle use, no dullness to percussion  CARD:  RRR, no m/r/g, no peripheral edema, pulses intact, no cyanosis or clubbing.  GI:   Soft & nt; nml bowel sounds; no organomegaly or masses detected.   Musco: Warm bil, no deformities or joint swelling noted.   Neuro: alert, no focal deficits noted.    Skin: Warm, no lesions or rashes    Lab Results:  CBC   BNP No results found for: BNP  ProBNP  Imaging: DG Chest 2 View  Result Date: 10/26/2019 CLINICAL DATA:  COPD with acute exacerbation EXAM: CHEST - 2 VIEW COMPARISON:  07/06/2018 chest  radiograph. FINDINGS: Surgical hardware from [facility] overlies the lower cervical spine. Cholecystectomy clips are seen in the right upper quadrant of the abdomen. Stable cardiomediastinal silhouette with normal heart size. No pneumothorax. No pleural effusion. Hyperinflated lungs. No pulmonary edema. Mild bibasilar linear scarring versus atelectasis. No acute consolidative airspace disease. IMPRESSION: 1. No acute consolidative airspace disease to suggest a pneumonia. 2. Hyperinflated lungs, compatible with the reported history of COPD. 3. Mild bibasilar linear scarring versus atelectasis. Electronically Signed   By: Ilona Sorrel M.D.   On: 10/26/2019 10:31      No flowsheet data found.  No results found for: NITRICOXIDE      Assessment & Plan:   COPD mixed type ([place]) Slow to resolve COPD exacerbation.  Check sputum culture.  Chest x-ray today without acute changes.   Plan  Patient Instructions  Sputum Culture  Chest xray today .  Zpack take as directed.  Mucinex Twice daily  As needed  Cough/congestion  Delsym 2 tsp Twice daily  For cough As needed   Tessalon Three times a day  As needed  Cough .  Continue on Symbicort 2 puffs Twice daily   Duoneb Three times a day   Follow up with Dr. Halford Chessman  Or Dasan Hardman NP in 6-8 weeks and As needed   Please contact office for sooner follow up if symptoms do not improve or worsen or seek emergency care       Allergic rhinitis Continue with trigger prevention   Total patient care time 30 minutes  Linsie Lupo, NP 10/26/2019

## 2019-10-26 NOTE — Patient Instructions (Signed)
Sputum Culture  Chest xray today .  Zpack take as directed.  Mucinex Twice daily  As needed  Cough/congestion  Delsym 2 tsp Twice daily  For cough As needed   Tessalon Three times a day  As needed  Cough .  Continue on Symbicort 2 puffs Twice daily   Duoneb Three times a day   Follow up with Dr. Halford Chessman  Or Aprille Sawhney NP in 6-8 weeks and As needed   Please contact office for sooner follow up if symptoms do not improve or worsen or seek emergency care

## 2019-10-26 NOTE — Assessment & Plan Note (Signed)
Slow to resolve COPD exacerbation.  Check sputum culture.  Chest x-ray today without acute changes.   Plan  Patient Instructions  Sputum Culture  Chest xray today .  Zpack take as directed.  Mucinex Twice daily  As needed  Cough/congestion  Delsym 2 tsp Twice daily  For cough As needed   Tessalon Three times a day  As needed  Cough .  Continue on Symbicort 2 puffs Twice daily   Duoneb Three times a day   Follow up with Dr. Halford Chessman  Or Mumin Denomme NP in 6-8 weeks and As needed   Please contact office for sooner follow up if symptoms do not improve or worsen or seek emergency care

## 2019-10-26 NOTE — Assessment & Plan Note (Signed)
Continue with trigger prevention

## 2019-10-30 NOTE — Progress Notes (Signed)
Reviewed and agree with assessment/plan.   Brenetta Penny, MD [facility] 10/07/2016, 12:24 PM Pager:  [phone]  

## 2019-11-01 ENCOUNTER — Other Ambulatory Visit: Payer: Self-pay

## 2019-11-01 ENCOUNTER — Encounter: Payer: Self-pay | Admitting: Orthopedic Surgery

## 2019-11-01 ENCOUNTER — Ambulatory Visit: Payer: Medicare HMO | Admitting: Orthopedic Surgery

## 2019-11-01 ENCOUNTER — Ambulatory Visit (INDEPENDENT_AMBULATORY_CARE_PROVIDER_SITE_OTHER): Payer: Medicare HMO

## 2019-11-01 VITALS — Ht 65.0 in | Wt 215.0 lb

## 2019-11-01 DIAGNOSIS — M25511 Pain in right shoulder: Secondary | ICD-10-CM

## 2019-11-01 DIAGNOSIS — G8929 Other chronic pain: Secondary | ICD-10-CM

## 2019-11-01 DIAGNOSIS — M1712 Unilateral primary osteoarthritis, left knee: Secondary | ICD-10-CM | POA: Diagnosis not present

## 2019-11-02 ENCOUNTER — Encounter: Payer: Self-pay | Admitting: Orthopedic Surgery

## 2019-11-02 DIAGNOSIS — M1712 Unilateral primary osteoarthritis, left knee: Secondary | ICD-10-CM | POA: Diagnosis not present

## 2019-11-02 DIAGNOSIS — M25511 Pain in right shoulder: Secondary | ICD-10-CM | POA: Diagnosis not present

## 2019-11-02 DIAGNOSIS — G8929 Other chronic pain: Secondary | ICD-10-CM | POA: Diagnosis not present

## 2019-11-02 MED ORDER — METHYLPREDNISOLONE ACETATE 40 MG/ML IJ SUSP
40.0000 mg | INTRAMUSCULAR | Status: AC | PRN
  Administered 2019-11-02: 40 mg via INTRA_ARTICULAR

## 2019-11-02 MED ORDER — LIDOCAINE HCL 1 % IJ SOLN
5.0000 mL | INTRAMUSCULAR | Status: AC | PRN
  Administered 2019-11-02: 5 mL

## 2019-11-02 MED ORDER — BUPIVACAINE HCL 0.25 % IJ SOLN
4.0000 mL | INTRAMUSCULAR | Status: AC | PRN
  Administered 2019-11-02: 4 mL via INTRA_ARTICULAR

## 2019-11-02 NOTE — Progress Notes (Signed)
Office Visit Note   Patient: Jenny [name]           Date of Birth: 1940-11-29           MRN: [id number] Visit Date: 11/01/2019 Requested by: Midge Minium, MD [address] [place],  [place] [address] PCP: Midge Minium, MD  Subjective: Chief Complaint  Patient presents with  . Left Knee - Pain  . Right Shoulder - Pain    HPI: [name] is a patient with left knee pain and right shoulder pain.  She had left knee aspiration and injection March 2020 with relief.  She is requesting repeat injection today.  She also describes right shoulder pain.  Pain radiates down to the elbow but she denies any neck pain.  Ibuprofen sometimes relieves the pain.  She does have a history of prior rotator cuff repair and distal clavicle excision.  Denies any interval injury to that right shoulder.              ROS: All systems reviewed are negative as they relate to the chief complaint within the history of present illness.  Patient denies  fevers or chills.   Assessment & Plan: Visit Diagnoses:  1. Chronic right shoulder pain   2. Unilateral primary osteoarthritis, left knee     Plan: Impression is left knee pain and arthritis with favorable sponsored injection of the past.  Repeat injection performed today.  Right shoulder has pretty good exam today and normal radiographs.  We will hold off on any intervention for the right shoulder for now.  Follow-up as needed.  Follow-Up Instructions: Return if symptoms worsen or fail to improve.   Orders:  Orders Placed This Encounter  Procedures  . XR Shoulder Right   No orders of the defined types were placed in this encounter.     Procedures: Large Joint Inj: L knee on 11/02/2019 7:49 AM Indications: diagnostic evaluation, joint swelling and pain Details: 18 G 1.5 in needle, superolateral approach  Arthrogram: No  Medications: 5 mL lidocaine 1 %; 40 mg methylPREDNISolone acetate 40 MG/ML; 4 mL bupivacaine 0.25 % Outcome:  tolerated well, no immediate complications Procedure, treatment alternatives, risks and benefits explained, specific risks discussed. Consent was given by the patient. Immediately prior to procedure a time out was called to verify the correct patient, procedure, equipment, support staff and site/side marked as required. Patient was prepped and draped in the usual sterile fashion.       Clinical Data: No additional findings.  Objective: Vital Signs: Ht 5\' 5"  (1.651 m)   Wt 215 lb (97.5 kg)   BMI 35.78 kg/m   Physical Exam:   Constitutional: Patient appears well-developed HEENT:  Head: Normocephalic Eyes:EOM are normal Neck: Normal range of motion Cardiovascular: Normal rate Pulmonary/chest: Effort normal Neurologic: Patient is alert Skin: Skin is warm Psychiatric: Patient has normal mood and affect    Ortho Exam: Ortho exam demonstrates full active and passive range of motion of that left knee with no effusion.  Extensor mechanism is intact.  Collateral and cruciate ligaments are stable.  No focal joint line tenderness is present.  No groin pain with internal extra rotation of the left leg.  Examination of the right shoulder demonstrates healed prior incision.  Motor sensory function of the hand is intact.  Only minimal coarse grinding in the right shoulder with internal extra rotation at 90 degrees of abduction.  No AC joint tenderness to direct palpation.  Pretty good  rotator cuff strength isolated infraspinatus supraspinatus and subscap muscle testing.  Specialty Comments:  No specialty comments available.  Imaging: XR Shoulder Right  Result Date: 11/02/2019 AP outlet axillary right shoulder reviewed.  Prior distal clavicle excision has been performed.  No glenohumeral joint arthritis.  Acromiohumeral distance maintained.  Visualized lung fields clear.    PMFS History: Patient Active Problem List   Diagnosis Date Noted  . Cough 07/06/2018  . Peripheral edema  02/22/2017  . Localized edema 12/17/2016  . Colon cancer screening 06/15/2016  . 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 Other fatigue 11/03/2015  . Hyperglycemia 07/07/2015  . Ceruminosis 03/07/2015  . Left hip pain 04/26/2014  . Acute pain of left knee 04/26/2014  . Annual physical exam 08/06/2013  . Atrophic vaginitis 06/17/2012  . Peripheral vascular disease, unspecified ([place]) 06/02/2012  . Urticaria 10/15/2011  . Anemia 09/21/2011  . 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  . Vitamin D deficiency 02/12/2011  . Peripheral neuropathy 02/12/2011  . Intertrigo 01/21/2011  . Asthma 01/14/2011  . 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 History of chicken pox 01/14/2011  . History of measles 01/14/2011  . HAND PAIN 04/09/2010  . OTHER URINARY INCONTINENCE 03/12/2010  . Depression with anxiety 03/06/2008  . Essential hypertension 02/22/2008  . Chronic bronchitis ([place]) 02/22/2008  . DIVERTICULOSIS OF COLON 02/22/2008  . Unilateral primary osteoarthritis, left knee 02/22/2008  . Hypothyroidism 08/23/2007  . Obesity 08/23/2007  . Obstructive sleep apnea 08/23/2007  . Venous (peripheral) insufficiency 08/23/2007  . Allergic rhinitis 08/23/2007  . GERD 08/23/2007  . BACK PAIN, LUMBAR 08/23/2007   Past Medical History:  Diagnosis Date  . ALLERGIC RHINITIS 08/23/2007  . Allergy    rhinitis  . 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 Anemia 09/21/2011  . Annual physical exam 08/06/2013  . Asthma    mild, intermittent  . Asthma 01/14/2011  . Atrophic vaginitis 06/17/2012  . BACK PAIN, LUMBAR 08/23/2007  . Bronchitis   . BRONCHITIS, RECURRENT 02/22/2008  . Cough    usually not productive  . Degenerative joint disease   . DEGENERATIVE JOINT DISEASE 02/22/2008  . Depression with anxiety 03/06/2008   Qualifier: Diagnosis of  By: Lenna Gilford MD, Deborra Medina   . Diverticulosis of colon   . DIVERTICULOSIS OF COLON 02/22/2008  . Dysthymia   . 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  . elevated glucose 08/21/2008  .  GERD 08/23/2007  . GERD (gastroesophageal reflux disease)   . HAND PAIN 04/09/2010  . History of chicken pox 01/14/2011  . History of measles 01/14/2011  . Hyperglycemia 07/07/2015  . Hypertension   . HYPERTENSION 02/22/2008  . Hypothyroidism   . HYPOTHYROIDISM 08/23/2007  . Incontinence of urine   . Lumbar back pain   . Obesity   . OBESITY 08/23/2007  . Obstructive sleep apnea    does not use CPAP, lost weight  . Other urinary incontinence 03/12/2010  . Peripheral neuropathy 02/12/2011   possibly related to spondylolisthesis per pt  . PONV (postoperative nausea and vomiting)   . Sleep apnea   . SLEEP APNEA, OBSTRUCTIVE 08/23/2007  . Transient memory loss March 2006   after a stressful event she had no memory the rest of that day  . Urinary frequency   . Urinary incontinence   . UTI (lower urinary tract infection) 03/23/2012  . Venous insufficiency   . VENOUS INSUFFICIENCY 08/23/2007  . Vitamin D deficiency   . VITAMIN D DEFICIENCY 08/21/2008    Family History  Problem Relation Age  of Onset  . COPD Mother   . Pneumonia Mother   . Other Mother        CHF  . Hypertension Mother   . Arthritis Mother        septic knee after replacement  . Heart disease Mother        chf  . Dementia Father   . Arthritis Brother   . Asthma Daughter   . Colon polyps Daughter   . Stroke Maternal Grandmother   . Heart disease Maternal Grandmother   . Other Maternal Grandfather        CHF  . Heart disease Maternal Grandfather        chf  . Cancer Paternal Grandmother        stomach?  . Other Paternal Grandfather        problems with kidneys  . Kidney disease Paternal Grandfather   . Anxiety disorder Daughter   . Cancer Maternal Aunt        breast  . Cancer Maternal Aunt        liver  . Colon cancer Neg Hx     Past Surgical History:  Procedure Laterality Date  . ABDOMINAL HYSTERECTOMY     took one ovary  . AP repair and sling for cystocele  8-04    Dr Gaynelle Arabian  . APPENDECTOMY    .  carpal tunel  2008  . CHOLECYSTECTOMY    . ctr     bilateral  . left hand  2010   left thumb, joint removal, CTR  . NECK SURGERY     x 2 - discectomy, fusion  . SHOULDER ARTHROSCOPY DISTAL CLAVICLE EXCISION AND OPEN ROTATOR CUFF REPAIR     right  . TONSILLECTOMY AND ADENOIDECTOMY    . TUBAL LIGATION     Social History   Occupational History  . Not on file  Tobacco Use  . Smoking status: Former Smoker    Packs/day: 1.50    Years: 30.00    Pack years: 45.00    Types: Cigarettes    Quit date: 10/13/1991    Years since quitting: 28.0  . Smokeless tobacco: Never Used  Substance and Sexual Activity  . Alcohol use: No    Alcohol/week: 0.0 standard drinks  . Drug use: No  . Sexual activity: Never

## 2019-11-06 ENCOUNTER — Encounter: Payer: Self-pay | Admitting: Family Medicine

## 2019-11-06 ENCOUNTER — Other Ambulatory Visit: Payer: Self-pay

## 2019-11-06 ENCOUNTER — Other Ambulatory Visit: Payer: Self-pay | Admitting: Family Medicine

## 2019-11-06 ENCOUNTER — Ambulatory Visit (INDEPENDENT_AMBULATORY_CARE_PROVIDER_SITE_OTHER): Payer: Medicare HMO | Admitting: Family Medicine

## 2019-11-06 VITALS — Ht 65.0 in | Wt 215.0 lb

## 2019-11-06 DIAGNOSIS — R5383 Other fatigue: Secondary | ICD-10-CM | POA: Diagnosis not present

## 2019-11-06 NOTE — Progress Notes (Signed)
Virtual Visit via Video   I connected with patient on 11/06/19 at 11:30 AM EST by a video enabled telemedicine application and verified that I am speaking with the correct person using two identifiers.  Location patient: Home Location provider: Acupuncturist, Office Persons participating in the virtual visit: Patient, Provider, [place] (Jess B)  I discussed the limitations of evaluation and management by telemedicine and the availability of in person appointments. The patient expressed understanding and agreed to proceed.  Subjective:   HPI:   Fatigue- pt is following w/ Pulmonary for ongoing COPD.  Pt has hx of anemia, hypothyroid, OSA, obesity, Vit D deficiency.  Pt reports 'Friday I slept all day long'.  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  Has not been using CPAP at work.  No fevers.  No N/V.  'I don't feel like I used to.  I just feel tired'.  Admits to poor water intake.  ROS:   See pertinent positives and negatives per HPI.  Patient Active Problem List   Diagnosis Date Noted  . Cough 07/06/2018  . Peripheral edema 02/22/2017  . Localized edema 12/17/2016  . Colon cancer screening 06/15/2016  . 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 Other fatigue 11/03/2015  . Hyperglycemia 07/07/2015  . Ceruminosis 03/07/2015  . Left hip pain 04/26/2014  . Acute pain of left knee 04/26/2014  . Annual physical exam 08/06/2013  . Atrophic vaginitis 06/17/2012  . Peripheral vascular disease, unspecified ([place]) 06/02/2012  . Urticaria 10/15/2011  . Anemia 09/21/2011  . 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  . Vitamin D deficiency 02/12/2011  . Peripheral neuropathy 02/12/2011  . Intertrigo 01/21/2011  . Asthma 01/14/2011  . 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 History of chicken pox 01/14/2011  . History of measles 01/14/2011  . HAND PAIN 04/09/2010  . OTHER URINARY INCONTINENCE 03/12/2010  . Depression with anxiety 03/06/2008  . Essential  hypertension 02/22/2008  . Chronic bronchitis ([place]) 02/22/2008  . DIVERTICULOSIS OF COLON 02/22/2008  . Unilateral primary osteoarthritis, left knee 02/22/2008  . Hypothyroidism 08/23/2007  . Obesity 08/23/2007  . Obstructive sleep apnea 08/23/2007  . Venous (peripheral) insufficiency 08/23/2007  . Allergic rhinitis 08/23/2007  . GERD 08/23/2007  . BACK PAIN, LUMBAR 08/23/2007    Social History   Tobacco Use  . Smoking status: Former Smoker    Packs/day: 1.50    Years: 30.00    Pack years: 45.00    Types: Cigarettes    Quit date: 10/13/1991    Years since quitting: 28.0  . Smokeless tobacco: Never Used  Substance Use Topics  . Alcohol use: No    Alcohol/week: 0.0 standard drinks    Current Outpatient Medications:  .  albuterol (VENTOLIN HFA) 108 (90 Base) MCG/ACT inhaler, Inhale 2 puffs into the lungs every 6 (six) hours as needed. For wheezing, Disp: 6.7 g, Rfl: 5 .  ALPRAZolam (XANAX) 0.5 MG tablet, TAKE 1/2 TO 1 TABLET BY MOUTH THREE TIMES DAILY AS NEEDED FOR ANXIETY, Disp: 90 tablet, Rfl: 1 .  amLODipine (NORVASC) 5 MG tablet, TAKE 1 TABLET BY MOUTH EVERY DAY, Disp: 90 tablet, Rfl: 0 .  benzonatate (TESSALON) 200 MG capsule, Take 1 capsule (200 mg total) by mouth 3 (three) times daily as needed for cough., Disp: 45 capsule, Rfl: 3 .  BESIVANCE 0.6 % SUSP, Place 1 drop into the left eye 3 (three) times daily., Disp: , Rfl:  .  budesonide-formoterol (SYMBICORT) 160-4.5 MCG/ACT  inhaler, Inhale 2 puffs into the lungs 2 (two) times daily., Disp: 1 Inhaler, Rfl: 0 .  DUREZOL 0.05 % EMUL, Place 1 drop into the left eye 3 (three) times daily., Disp: , Rfl:  .  escitalopram (LEXAPRO) 5 MG tablet, TAKE 1 TABLET(5 MG) BY MOUTH DAILY, Disp: 90 tablet, Rfl: 0 .  fluticasone (FLONASE) 50 MCG/ACT nasal spray, Place 1 spray into both nostrils daily., Disp: 16 g, Rfl: 2 .  furosemide (LASIX) 20 MG tablet, Take 20 mg by mouth. Take 2 tablet daily as needed, Disp: , Rfl:  .  gabapentin  (NEURONTIN) 300 MG capsule, TAKE 1 CAPSULE(300 MG) BY MOUTH THREE TIMES DAILY, Disp: 270 capsule, Rfl: 0 .  guaifenesin (MUCUS RELIEF) 400 MG TABS tablet, Take 400 mg by mouth 2 (two) times a day., Disp: , Rfl:  .  ibuprofen (ADVIL,MOTRIN) 200 MG tablet, Take 400 mg by mouth every 6 (six) hours as needed for moderate pain., Disp: , Rfl:  .  ipratropium-albuterol (DUONEB) 0.5-2.5 (3) MG/3ML SOLN, USE 3 ML VIA NEBULIZER FOUR TIMES DAILY, Disp: 360 mL, Rfl: 11 .  levothyroxine (SYNTHROID) 100 MCG tablet, TAKE 1 TABLET BY MOUTH EVERY DAY BEFORE BREAKFAST, Disp: 90 tablet, Rfl: 0 .  pantoprazole (PROTONIX) 40 MG tablet, TAKE 1 TABLET BY MOUTH EVERY DAY 30 MINUTES BEFORE DINNER, Disp: 90 tablet, Rfl: 1 .  PROLENSA 0.07 % SOLN, Place 1 drop into the left eye at bedtime., Disp: , Rfl:   Allergies  Allergen Reactions  . Prednisone Other (See Comments)    Confusion, dizziness Medrol is ok  . Codeine Nausea Only       . Tramadol Itching    Objective:   Ht 5\' 5"  (1.651 m)   Wt 215 lb (97.5 kg)   BMI 35.78 kg/m  AAOx3, NAD, obese NCAT, EOMI No obvious CN deficits Coloring WNL Pt is able to speak clearly, coherently without shortness of breath or increased work of breathing.  Thought process is linear.  Mood is appropriate.   Assessment and Plan:   Fatigue- ongoing issue for pt.  Likely multifactorial- poor sleep, not using CPAP during the week, ongoing pulmonary issues, hx of anemia and Vit D deficiency.  Will check labs to r/o metabolic causes but discussed the need for lifestyle changes.  Pt will increase her water intake, bring her CPAP to work during the week, and attempt to be more active during the day.  Will follow.   Jenny Asa, MD 11/06/2019

## 2019-11-06 NOTE — Progress Notes (Signed)
I have discussed the procedure for the virtual visit with the patient who has given consent to proceed with assessment and treatment.   Pt unable to obtain vitals.   Jenny [name], CMA     

## 2019-11-07 NOTE — Telephone Encounter (Signed)
Last refill: 11.20.20 #90, 1 Last OV: 1.25.21 dx. Fatigue

## 2019-11-09 ENCOUNTER — Other Ambulatory Visit: Payer: Self-pay

## 2019-11-09 ENCOUNTER — Ambulatory Visit (INDEPENDENT_AMBULATORY_CARE_PROVIDER_SITE_OTHER): Payer: Medicare HMO

## 2019-11-09 DIAGNOSIS — R5383 Other fatigue: Secondary | ICD-10-CM

## 2019-11-09 LAB — BASIC METABOLIC PANEL
BUN: 13 mg/dL (ref 6–23)
CO2: 30 mEq/L (ref 19–32)
Calcium: 8.8 mg/dL (ref 8.4–10.5)
Chloride: 103 mEq/L (ref 96–112)
Creatinine, Ser: 0.93 mg/dL (ref 0.40–1.20)
GFR: 57.86 mL/min — ABNORMAL LOW (ref >60.00)
Glucose, Bld: 114 mg/dL — ABNORMAL HIGH (ref 70–99)
Potassium: 4.8 mEq/L (ref 3.5–5.1)
Sodium: 138 mEq/L (ref 135–145)

## 2019-11-09 LAB — CBC WITH DIFFERENTIAL/PLATELET
Basophils Absolute: 0.1 10*3/uL (ref 0.0–0.1)
Basophils Relative: 1 % (ref 0.0–3.0)
Eosinophils Absolute: 0.7 10*3/uL (ref 0.0–0.7)
Eosinophils Relative: 8.9 % — ABNORMAL HIGH (ref 0.0–5.0)
HCT: 42.3 % (ref 36.0–46.0)
Hemoglobin: 13.9 g/dL (ref 12.0–15.0)
Lymphocytes Relative: 23 % (ref 12.0–46.0)
Lymphs Abs: 1.8 10*3/uL (ref 0.7–4.0)
MCHC: 32.8 g/dL (ref 30.0–36.0)
MCV: 92.8 fl (ref 78.0–100.0)
Monocytes Absolute: 0.7 10*3/uL (ref 0.1–1.0)
Monocytes Relative: 8.3 % (ref 3.0–12.0)
Neutro Abs: 4.6 10*3/uL (ref 1.4–7.7)
Neutrophils Relative %: 58.8 % (ref 43.0–77.0)
Platelets: 273 10*3/uL (ref 150.0–400.0)
RBC: 4.56 Mil/uL (ref 3.87–5.11)
RDW: 14.2 % (ref 11.5–15.5)
WBC: 7.9 10*3/uL (ref 4.0–10.5)

## 2019-11-09 LAB — HEPATIC FUNCTION PANEL
ALT: 11 U/L (ref 0–35)
AST: 14 U/L (ref 0–37)
Albumin: 3.7 g/dL (ref 3.5–5.2)
Alkaline Phosphatase: 77 U/L (ref 39–117)
Bilirubin, Direct: 0.1 mg/dL (ref 0.0–0.3)
Total Bilirubin: 0.5 mg/dL (ref 0.2–1.2)
Total Protein: 6 g/dL (ref 6.0–8.3)

## 2019-11-09 LAB — VITAMIN D 25 HYDROXY (VIT D DEFICIENCY, FRACTURES): VITD: 17.35 ng/mL — ABNORMAL LOW (ref 30.00–100.00)

## 2019-11-09 LAB — TSH: TSH: 27.35 u[IU]/mL — ABNORMAL HIGH (ref 0.35–4.50)

## 2019-11-10 ENCOUNTER — Other Ambulatory Visit: Payer: Self-pay

## 2019-11-10 DIAGNOSIS — E039 Hypothyroidism, unspecified: Secondary | ICD-10-CM

## 2019-11-10 MED ORDER — VITAMIN D (ERGOCALCIFEROL) 1.25 MG (50000 UNIT) PO CAPS: 50000.0000 [IU] | ORAL_CAPSULE | ORAL | 3 refills | Status: DC

## 2019-11-10 MED ORDER — LEVOTHYROXINE SODIUM 112 MCG PO TABS: 112.0000 ug | ORAL_TABLET | Freq: Every day | ORAL | 0 refills | Status: DC

## 2019-11-23 DIAGNOSIS — H16223 Keratoconjunctivitis sicca, not specified as Sjogren's, bilateral: Secondary | ICD-10-CM | POA: Diagnosis not present

## 2019-11-23 DIAGNOSIS — H5310 Unspecified subjective visual disturbances: Secondary | ICD-10-CM | POA: Diagnosis not present

## 2019-12-02 ENCOUNTER — Other Ambulatory Visit: Payer: Self-pay | Admitting: Family Medicine

## 2019-12-04 ENCOUNTER — Ambulatory Visit: Payer: Medicare HMO

## 2019-12-05 ENCOUNTER — Other Ambulatory Visit: Payer: Self-pay | Admitting: Family Medicine

## 2019-12-05 ENCOUNTER — Ambulatory Visit (INDEPENDENT_AMBULATORY_CARE_PROVIDER_SITE_OTHER): Payer: Medicare HMO | Admitting: *Deleted

## 2019-12-05 ENCOUNTER — Other Ambulatory Visit: Payer: Self-pay

## 2019-12-05 DIAGNOSIS — E039 Hypothyroidism, unspecified: Secondary | ICD-10-CM | POA: Diagnosis not present

## 2019-12-05 LAB — TSH: TSH: 3.66 u[IU]/mL (ref 0.35–4.50)

## 2019-12-06 ENCOUNTER — Encounter: Payer: Self-pay | Admitting: Family Medicine

## 2019-12-06 ENCOUNTER — Other Ambulatory Visit: Payer: Self-pay | Admitting: Family Medicine

## 2019-12-06 MED ORDER — LEVOTHYROXINE SODIUM 112 MCG PO TABS: 112.0000 ug | ORAL_TABLET | Freq: Every day | ORAL | 1 refills | Status: DC

## 2019-12-08 ENCOUNTER — Other Ambulatory Visit: Payer: Medicare HMO

## 2019-12-08 ENCOUNTER — Ambulatory Visit: Payer: Medicare HMO | Admitting: Pulmonary Disease

## 2019-12-08 ENCOUNTER — Other Ambulatory Visit: Payer: Self-pay

## 2019-12-08 ENCOUNTER — Encounter: Payer: Self-pay | Admitting: Pulmonary Disease

## 2019-12-08 VITALS — BP 130/68 | HR 91 | Temp 97.1°F | Ht 65.0 in | Wt 222.2 lb

## 2019-12-08 DIAGNOSIS — J301 Allergic rhinitis due to pollen: Secondary | ICD-10-CM

## 2019-12-08 DIAGNOSIS — J449 Chronic obstructive pulmonary disease, unspecified: Secondary | ICD-10-CM

## 2019-12-08 DIAGNOSIS — J441 Chronic obstructive pulmonary disease with (acute) exacerbation: Secondary | ICD-10-CM

## 2019-12-08 DIAGNOSIS — G4733 Obstructive sleep apnea (adult) (pediatric): Secondary | ICD-10-CM | POA: Diagnosis not present

## 2019-12-08 MED ORDER — FLUTICASONE PROPIONATE 50 MCG/ACT NA SUSP: 1.0000 | Freq: Every day | NASAL | 2 refills | Status: DC

## 2019-12-08 MED ORDER — AMOXICILLIN-POT CLAVULANATE 875-125 MG PO TABS: 1.0000 | ORAL_TABLET | Freq: Two times a day (BID) | ORAL | 0 refills | Status: DC

## 2019-12-08 MED ORDER — METHYLPREDNISOLONE 4 MG PO TABS: ORAL_TABLET | ORAL | 0 refills | Status: DC

## 2019-12-08 MED ORDER — BUDESONIDE-FORMOTEROL FUMARATE 160-4.5 MCG/ACT IN AERO: 2.0000 | INHALATION_SPRAY | Freq: Two times a day (BID) | RESPIRATORY_TRACT | 6 refills | Status: DC

## 2019-12-08 NOTE — Patient Instructions (Signed)
Augmentin 1 pill twice per day for 7 days  Medrol 4 mg pill >> 4 pills daily for 2 days, 3 pills daily for 2 days, 2 pills daily for 2 days, 1 pill daily for 2 days  Follow up in 1 week with Dr. Halford Chessman or Nurse Practitioner >> can be video visit

## 2019-12-08 NOTE — Progress Notes (Signed)
[name]  Chief Complaint  Patient presents with  . Follow-up    COPD mixed type ([place])    Constitutional:  BP 130/68 (BP Location: Left Arm, Patient Position: Sitting, Cuff Size: Large)   Pulse 91   Temp (!) 97.1 F (36.2 C)   Ht 5\' 5"  (1.651 m)   Wt 222 lb 3.2 oz (100.8 kg)   SpO2 94% Comment: on room air  BMI 36.98 kg/m   Past Medical History:  Vit D deficiency, Neuropathy, Low back pain, Hypothyroidism, HTN, GERD, Diverticulosis, Depression, Anxiety, DJD  Brief Summary:  Jenny [name] is a 79 y.o. female former smoker with OSA, and COPD with chronic bronchitis.  Was seen in December and January for slow to resolve exacerbation.  CXR from 10/26/19 (reviewed by me) was negative for pneumonia.  Labs from 11/09/19 showed Hb 13.9, WBC 7.9, normal LFTs, creatinine 0.93, CO2 30.  However, TSH was 27.35.  Repeat TSH from 12/05/19 was 3.66.  She continues to have cough with chest congestion.  Brings up yellow-green sputum and white plugs.  No hemoptysis.  Not having sinus congestion, sore throat, headache, chest pain, fever, abdominal pain, nausea, diarrhea, skin rash, or leg swelling.  Symbicort and albuterol help.  Uses CPAP w/o difficulty.  She did get first dose of COVID vaccine.  Physical Exam:   Appearance - well kempt   ENMT - no sinus tenderness, no nasal discharge, no oral exudate  Neck - no masses, trachea midline, no thyromegaly, no elevation in JVP  Respiratory - b/l expiratory wheeze with scattered rhonchi that partially clears with coughing  CV - s1s2 regular rate and rhythm, no murmurs, no peripheral edema, radial pulses symmetric  GI - soft, non tender  Lymph - no adenopathy noted in neck and axillary areas  Ext - no cyanosis, clubbing, or joint inflammation noted  Skin - no rashes, lesions, or ulcers  Psych - normal mood and affect    Assessment/Plan:   Recurrent COPD exacerbation with hx of COPD from  chronic bronchitis. - singulair caused nightmares - intolerant of prednisone, but can use medrol - will give course of augmentin and medrol - continue symbicort and prn albuterol - she was given sputum sample cup and will try to get sample to lab for culture - will f/u in 1 week; if no improvement, then consider getting CT chest to further assess  Allergic rhinitis with post nasal drip. - continue flonase, mucinex  Obstructive sleep apnea. - she is compliant with CPAP - continue auto CPAP   Patient Instructions  Augmentin 1 pill twice per day for 7 days  Medrol 4 mg pill >> 4 pills daily for 2 days, 3 pills daily for 2 days, 2 pills daily for 2 days, 1 pill daily for 2 days  Follow up in 1 week with Dr. Halford Chessman or Nurse Practitioner >> can be video visit   Time spent 32 minutes   Chesley Mires, MD [place] Pager: [phone] 12/08/2019, 9:58 AM  Flow Sheet     Pulmonary tests:  Spirometry 04/21/18 >> FEV1 1.3 (64%), FEV1% 83  Sleep tests:  PSG April 2008 >> AHI 29, SpO2 low 62% Auto CPAP 11/07/19 to 12/05/18 >> used on 22 of 30 nights with average 8 hrs 40 min.  Average AHI 1.2 with median CPAP 7 and 95 th percentile CPAP 11 cm H2O.  Cardiac tests:  Echo 03/16/17 >> EF 60 to 65%  Medications:  Allergies as of 12/08/2019      Reactions   Prednisone Other (See Comments)   Confusion, dizziness Medrol is ok   Codeine Nausea Only       Tramadol Itching      Medication List       Accurate as of December 08, 2019  9:58 AM. If you have any questions, ask your nurse or doctor.        STOP taking these medications   Besivance 0.6 % Susp Generic drug: Besifloxacin HCl Stopped by: Chesley Mires, MD   Prolensa 0.07 % Soln Generic drug: Bromfenac Sodium Stopped by: Chesley Mires, MD     TAKE these medications   albuterol 108 (90 Base) MCG/ACT inhaler Commonly known as: VENTOLIN HFA Inhale 2 puffs into the lungs every 6 (six) hours as needed.  For wheezing   ALPRAZolam 0.5 MG tablet Commonly known as: XANAX TAKE 1/2 TO 1 TABLET BY MOUTH THREE TIMES DAILY AS NEEDED FOR ANXIETY   amLODipine 5 MG tablet Commonly known as: NORVASC TAKE 1 TABLET BY MOUTH EVERY DAY   amoxicillin-clavulanate 875-125 MG tablet Commonly known as: AUGMENTIN Take 1 tablet by mouth 2 (two) times daily. Started by: Chesley Mires, MD   benzonatate 200 MG capsule Commonly known as: TESSALON Take 1 capsule (200 mg total) by mouth 3 (three) times daily as needed for cough.   budesonide-formoterol 160-4.5 MCG/ACT inhaler Commonly known as: SYMBICORT Inhale 2 puffs into the lungs 2 (two) times daily.   Durezol 0.05 % Emul Generic drug: Difluprednate Place 1 drop into the left eye 3 (three) times daily.   escitalopram 5 MG tablet Commonly known as: LEXAPRO TAKE 1 TABLET(5 MG) BY MOUTH DAILY   fluticasone 50 MCG/ACT nasal spray Commonly known as: FLONASE Place 1 spray into both nostrils daily.   furosemide 20 MG tablet Commonly known as: LASIX Take 20 mg by mouth. Take 2 tablet daily as needed   gabapentin 300 MG capsule Commonly known as: NEURONTIN TAKE 1 CAPSULE(300 MG) BY MOUTH THREE TIMES DAILY What changed: additional instructions   ibuprofen 200 MG tablet Commonly known as: ADVIL Take 400 mg by mouth every 6 (six) hours as needed for moderate pain.   ipratropium-albuterol 0.5-2.5 (3) MG/3ML Soln Commonly known as: DUONEB USE 3 ML VIA NEBULIZER FOUR TIMES DAILY   levothyroxine 112 MCG tablet Commonly known as: SYNTHROID Take 1 tablet (112 mcg total) by mouth daily.   methylPREDNISolone 4 MG tablet Commonly known as: Medrol Take 4 tablets (16 mg total) by mouth daily for 2 days, THEN 3 tablets (12 mg total) daily for 2 days, THEN 2 tablets (8 mg total) daily for 2 days, THEN 1 tablet (4 mg total) daily for 2 days. Start taking on: December 08, 2019 Started by: Chesley Mires, MD   Mucus Relief 400 MG Tabs tablet Generic drug:  guaifenesin Take 400 mg by mouth 2 (two) times a day.   pantoprazole 40 MG tablet Commonly known as: PROTONIX TAKE 1 TABLET BY MOUTH EVERY DAY 30 MINUTES BEFORE DINNER   Vitamin D (Ergocalciferol) 1.25 MG (50000 UNIT) Caps capsule Commonly known as: DRISDOL Take 1 capsule (50,000 Units total) by mouth every 7 (seven) days.       Past Surgical History:  She  has a past surgical history that includes Appendectomy; Tonsillectomy and adenoidectomy; AP repair and sling for cystocele (8-04 ); left hand (2010); ctr; Shoulder arthroscopy distal clavicle excision and open rotator cuff repair; Neck surgery; Abdominal hysterectomy; Cholecystectomy; carpal  tunel (2008); and Tubal ligation.  Family History:  Her family history includes Anxiety disorder in her daughter; Arthritis in her brother and mother; Asthma in her daughter; COPD in her mother; Cancer in her maternal aunt, maternal aunt, and paternal grandmother; Colon polyps in her daughter; Dementia in her father; Heart disease in her maternal grandfather, maternal grandmother, and mother; Hypertension in her mother; Kidney disease in her paternal grandfather; Other in her maternal grandfather, mother, and paternal grandfather; Pneumonia in her mother; Stroke in her maternal grandmother.  Social History:  She  reports that she quit smoking about 28 years ago. Her smoking use included cigarettes. She has a 45.00 pack-year smoking history. She has never used smokeless tobacco. She reports that she does not drink alcohol or use drugs.

## 2019-12-11 LAB — RESPIRATORY CULTURE OR RESPIRATORY AND SPUTUM CULTURE
MICRO NUMBER:: 10193397
RESULT:: NORMAL
SPECIMEN QUALITY:: ADEQUATE

## 2019-12-12 DIAGNOSIS — G4733 Obstructive sleep apnea (adult) (pediatric): Secondary | ICD-10-CM | POA: Diagnosis not present

## 2019-12-15 ENCOUNTER — Telehealth (INDEPENDENT_AMBULATORY_CARE_PROVIDER_SITE_OTHER): Payer: Medicare HMO | Admitting: Primary Care

## 2019-12-15 ENCOUNTER — Telehealth: Payer: Self-pay | Admitting: Primary Care

## 2019-12-15 ENCOUNTER — Telehealth: Payer: Medicare HMO | Admitting: Primary Care

## 2019-12-15 ENCOUNTER — Encounter: Payer: Self-pay | Admitting: Primary Care

## 2019-12-15 DIAGNOSIS — J449 Chronic obstructive pulmonary disease, unspecified: Secondary | ICD-10-CM | POA: Diagnosis not present

## 2019-12-15 DIAGNOSIS — R0602 Shortness of breath: Secondary | ICD-10-CM

## 2019-12-15 DIAGNOSIS — J441 Chronic obstructive pulmonary disease with (acute) exacerbation: Secondary | ICD-10-CM

## 2019-12-15 MED ORDER — AMOXICILLIN-POT CLAVULANATE 875-125 MG PO TABS: 1.0000 | ORAL_TABLET | Freq: Two times a day (BID) | ORAL | 0 refills | Status: AC

## 2019-12-15 MED ORDER — FLUTTER DEVI: 1.0000 | Freq: Three times a day (TID) | 0 refills | Status: DC

## 2019-12-15 MED ORDER — METHYLPREDNISOLONE 4 MG PO TABS: ORAL_TABLET | ORAL | 0 refills | Status: AC

## 2019-12-15 NOTE — Progress Notes (Signed)
.  Virtual Visit via Video Note  I connected with Jenny [name] on 12/15/19 at  9:00 AM EST by a video enabled telemedicine application and verified that I am speaking with the correct person using two identifiers.  Location: Patient: Home Provider: Office   I discussed the limitations of evaluation and management by telemedicine and the availability of in person appointments. The patient expressed understanding and agreed to proceed.  History of Present Illness: 79 year old female, former smoker quit in [place] (45 pack yaer hx); exposured to second hand smoke. PMH significant for COPD mixed type, OSA (CPAP, allergic rhinitis, chronic bronchitis, GERD, HTN. Patient of Dr. Halford Chessman, last seen on 12/08/19 for COPD exacerbation and treated with Augmentin and medrol dose pack. Maintained on Symbicort 2 pufs twice daily, PRN Albuterol hfa and Duoneb. Recommend fu in 1 week. Eosinophils absolute 700 in January.   12/15/2019 Patient contacted today for acute televisit. States that she is better but not 100%, finishes Augmentin course today. Continues to have congested cough and wheezing. She is getting up yellow/green mucus. She is compliant with Symbicort and is using Flonase daily. She also takes OTC zyrtec. Would like prescription for new flutter valve and wants to give a new sputum culture. Denies sick conact, fever, chills. She will be getting her second covid vaccine a week from this Sunday.   Observations/Objective:  - Appears well, no respiratory distress  Spirometry 04/21/18 >> FEV1 1.3 (64%), FEV1% 83  Assessment and Plan:  COPD exacerbation: - Extend Augmentin 3 additional days and medrol taper  COPD mixed type: - Multiple exacerbations requiring prolonged course of oral steroids  - Eos absolute 700 in January 2020 - Continue Symbicort 160 twice daily; prn albuterol - Adding Singulair 10mg  at bedtime; continue zytec  - Checking CBC with diff and IgE while not taking medrol (May qualify  for biologic therapy) - Getting second covid vaccine in 10 days   Follow Up Instructions:  2-4 weeks or sooner if needed    I discussed the assessment and treatment plan with the patient. The patient was provided an opportunity to ask questions and all were answered. The patient agreed with the plan and demonstrated an understanding of the instructions.   The patient was advised to call back or seek an in-person evaluation if the symptoms worsen or if the condition fails to improve as anticipated.  I provided 20 minutes of non-face-to-face time during this encounter.   Martyn Ehrich, NP

## 2019-12-15 NOTE — Telephone Encounter (Signed)
Called and spoke with pt letting her know that the flutter valve and sputum specimen cups are ready to be picked up and she stated that her daughter Lorriane Shire was going to come by today to pick them up for her instead of having to wait until Monday when Suanne Marker would be coming for the injection. Nothing further needed.

## 2019-12-15 NOTE — Patient Instructions (Addendum)
Recommendations: - Recheck sputum culture - Adding flutter valve three times a day - Adding Singulair 10mg  at bedtime  Continue: - Zyrtec 10mg  daily  - mucinex twice a day - Symbicort twice a day  Orders: - Sputum culture x3 - Flutter valve  - Labs in 2-3 weeks   Follow-up - 2-4 weeks with Dr. Halford Chessman or [facility]

## 2019-12-18 NOTE — Progress Notes (Signed)
Reviewed and agree with assessment/plan.   Mckale Haffey, MD [facility] 10/07/2016, 12:24 PM Pager:  [phone]  

## 2019-12-21 NOTE — Telephone Encounter (Signed)
Beth, please see pt's mychart message as an FYI.

## 2020-01-09 ENCOUNTER — Telehealth: Payer: Self-pay | Admitting: Family Medicine

## 2020-01-09 NOTE — Progress Notes (Signed)
  Chronic Care Management   Note  01/09/2020 Name: Jenny [name] MRN: [id number] DOB: Mar 12, 1941  Jenny [name] is a 79 y.o. year old female who is a primary care patient of [name], [name], MD. I reached out to [name] by phone today in response to a referral sent by Ms. Shaune Spittle Friis's PCP, Midge Minium, MD.   Ms. Saffold was given information about Chronic Care Management services today including:  1. CCM service includes personalized support from designated clinical staff supervised by her physician, including individualized plan of care and coordination with other care providers 2. 24/7 contact phone numbers for assistance for urgent and routine care needs. 3. Service will only be billed when office clinical staff spend 20 minutes or more in a month to coordinate care. 4. Only one practitioner may furnish and bill the service in a calendar month. 5. The patient may stop CCM services at any time (effective at the end of the month) by phone call to the office staff.   Patient agreed to services and verbal consent obtained.   Follow up plan:   Earney Hamburg [employer] Scheduler

## 2020-01-10 ENCOUNTER — Other Ambulatory Visit: Payer: Self-pay | Admitting: Family Medicine

## 2020-01-11 NOTE — Telephone Encounter (Signed)
Xanax last rx 11/07/19 #90 1 RF LOV: 11/06/19

## 2020-01-12 DIAGNOSIS — G4733 Obstructive sleep apnea (adult) (pediatric): Secondary | ICD-10-CM | POA: Diagnosis not present

## 2020-01-15 ENCOUNTER — Ambulatory Visit: Payer: Medicare HMO

## 2020-01-15 DIAGNOSIS — E039 Hypothyroidism, unspecified: Secondary | ICD-10-CM

## 2020-01-15 DIAGNOSIS — I10 Essential (primary) hypertension: Secondary | ICD-10-CM

## 2020-01-15 NOTE — Progress Notes (Signed)
Chronic Care Management Pharmacy  Name: MERL ANCELET   MRN: [id number]  DOB: [birth year]  Chief Complaint/HPI Jenny [name],  79 y.o., female presents for their Initial CCM visit with the clinical pharmacist via telephone due to COVID-19 Pandemic.  PCP : Midge Minium, MD Their chronic conditions include: HTN, Hypothyroidism, Anxiety and Depression  Office Visits: -1/25: Seen for fatigue, levothyroxine inc. From 100 to 112 mcg Consult Visits: -2/26 ([place], resp.): COPD exacerbation. Augmentin and Medrol prescribed.  -1/14: COPD exacerbation  Outpatient Encounter Medications as of 01/15/2020  Medication Sig Note  . albuterol (VENTOLIN HFA) 108 (90 Base) MCG/ACT inhaler Inhale 2 puffs into the lungs every 6 (six) hours as needed. For wheezing   . ALPRAZolam (XANAX) 0.5 MG tablet TAKE 1/2 TO 1 TABLET BY MOUTH THREE TIMES DAILY AS NEEDED FOR ANXIETY   . amLODipine (NORVASC) 5 MG tablet TAKE 1 TABLET BY MOUTH EVERY DAY   . benzonatate (TESSALON) 200 MG capsule Take 1 capsule (200 mg total) by mouth 3 (three) times daily as needed for cough.   . budesonide-formoterol (SYMBICORT) 160-4.5 MCG/ACT inhaler Inhale 2 puffs into the lungs 2 (two) times daily.   Marland Kitchen escitalopram (LEXAPRO) 5 MG tablet TAKE 1 TABLET(5 MG) BY MOUTH DAILY   . fluticasone (FLONASE) 50 MCG/ACT nasal spray Place 1 spray into both nostrils daily.   . furosemide (LASIX) 20 MG tablet Take 20 mg by mouth. Take 2 tablet daily as needed   . gabapentin (NEURONTIN) 300 MG capsule TAKE 1 CAPSULE(300 MG) BY MOUTH THREE TIMES DAILY (Patient taking differently: TAKE 1 CAPSULE(300 MG) BY MOUTH THREE TIMES DAILY as needed)   . guaifenesin (MUCUS RELIEF) 400 MG TABS tablet Take 400 mg by mouth 2 (two) times a day.   . ibuprofen (ADVIL,MOTRIN) 200 MG tablet Take 400 mg by mouth every 6 (six) hours as needed for moderate pain.   Marland Kitchen ipratropium-albuterol (DUONEB) 0.5-2.5 (3) MG/3ML SOLN USE 3 ML VIA NEBULIZER FOUR TIMES DAILY   .  levothyroxine (SYNTHROID) 112 MCG tablet Take 1 tablet (112 mcg total) by mouth daily.   . pantoprazole (PROTONIX) 40 MG tablet TAKE 1 TABLET BY MOUTH EVERY DAY 30 MINUTES BEFORE DINNER   . Respiratory Therapy Supplies (FLUTTER) DEVI 1 Device by Does not apply route in the morning, at noon, and at bedtime.   . Vitamin D, Ergocalciferol, (DRISDOL) 1.25 MG (50000 UNIT) CAPS capsule Take 1 capsule (50,000 Units total) by mouth every 7 (seven) days.   . DUREZOL 0.05 % EMUL Place 1 drop into the left eye 3 (three) times daily.   . [DISCONTINUED] esomeprazole (NEXIUM) 40 MG capsule Take 40 mg by mouth daily before breakfast.   12/18/2011: Dr Marlyce Huge put pt on Zantac   No facility-administered encounter medications on file as of 01/15/2020.   Current Diagnosis/Assessment:  Goals Addressed            This Visit's Progress   . PharmD Care Plan       CARE PLAN ENTRY  Current Barriers:  . Chronic Disease Management support, education, and care coordination needs related to HTN and hypothyroidism  Pharmacist Clinical Goal(s):  . BP <140/90 . TSH 0.35-4.5 . Receive Shingrix vaccine series over the next year  Interventions: . Comprehensive medication review performed. . Will file for patient assistance on COPD medications with patient  Patient Self Care Activities:  . Patient verbalizes understanding of plan to work with pharmacist on patient assistance . Please call me with any  questions at [phone]  Initial goal documentation       Hypertension  Office blood pressures are  BP Readings from Last 3 Encounters:  12/08/19 130/68  10/26/19 118/64  08/22/19 134/82  BP currently at goal of <140/90. Taking amlodipine 5 mg daily, furosemide 20 mg two tablets daily as needed for swelling in ankles.  Denies dizziness, chest pain, or any recent falls. Patient checks BP at home occasionally and reports normally around 120/60.  Plan Continue current medications   COPD  Several  exacerbations since December. Now has started using Duoneb again as 3 mL four times daily in addition to current maintenance regimen of Symbicort 160-4.5 two puffs into the lungs two times daily. Also has ventolin inhaler, which is used as 2 puffs every 6 hours as needed.   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 Feels that her activity level is still limited due to SOB. Expressed that she is not interested in starting on Singulair due to cost concerns and number of total medications. We discussed concerns regarding Symbicort as she may no longer be able to afford once the next two inhalers run out. We agreed to work together on filing for patient assistance for Symbicort and possibly albuterol inhaler.  Plan Continue current medications and file for patient assistance.   Anxiety and Depression  Patient is currently controlled on the following medications: escitalopram 5mg  daily, alprazolam 0.5 mg take 1/2 tablet by mouth three times daily as needed for anxiety. States these medications are still providing benefit to her. Denies any side effects at this time, including dizziness or falls.   Plan Continue current medications  Hypothyroidism   TSH  Date Value Ref Range Status  12/05/2019 3.66 0.35 - 4.50 uIU/mL Final  Previously on levothyroxine 194mcg. Increased to 158mcg following TSH 27.35 on 10/2019 reading. Most recent TSH controlled in normal range.  Patient describes feeling significantly better following up titration of levothyroxine, no longer feels as sluggish/tired.    We discussed recent TSH lab and appropriate use of levothyroxine.   Plan Continue current medications  GERD  Patient is currently controlled on the following medications: Protonix 40mg  daily.  Denies symptoms of acid reflux throughout the past month with current regimen. Expresses understanding of medication purpose and how acid reflux may worsen  COPD symptoms.   Plan Continue current medications  Vaccines  Reviewed and discussed patient's vaccination history.  We went over Shingrix eligibility and patient agrees that she should receive.  Immunization History  Administered Date(s) Administered  . Fluad Quad(high Dose 65+) 08/02/2019  . Influenza Split 08/20/2011, 09/30/2012  . Influenza Whole 08/13/2013  . Influenza, High Dose Seasonal PF 10/13/2014, 09/03/2015, 06/03/2017, 09/25/2018  . Influenza-Unspecified 07/11/2016  . Moderna SARS-COVID-2 Vaccination 11/17/2019, 12/24/2019  . Pneumococcal Conjugate-13 03/16/2014  . Pneumococcal Polysaccharide-23 06/23/2016  . Tdap 09/22/2011  . Zoster 03/16/2014   Plan Recommended patient receive Shingrix vaccine in at local pharmacy pharmacy.  ______________ Visit Information Ms. Neitz was given information about Chronic Care Management services today including:  1. CCM service includes personalized support from designated clinical staff supervised by her physician, including individualized plan of care and coordination with other care providers 2. 24/7 contact phone numbers for assistance for urgent and routine care needs. 3. Standard insurance, coinsurance, copays and deductibles apply for chronic care management only during months in which we provide at least 20 minutes of these services. Most insurances cover these services at  100%, however patients may be responsible for any copay, coinsurance and/or deductible if applicable. This service may help you avoid the need for more expensive face-to-face services. 4. Only one practitioner may furnish and bill the service in a calendar month. 5. The patient may stop CCM services at any time (effective at the end of the month) by phone call to the office staff.  Patient agreed to services and verbal consent obtained.   [name], Pharm.D. Clinical Pharmacist [facility] at [facility] [phone]

## 2020-01-15 NOTE — Patient Instructions (Signed)
Visit Information  Goals Addressed            This Visit's Progress   . PharmD Care Plan       CARE PLAN ENTRY  Current Barriers:  . Chronic Disease Management support, education, and care coordination needs related to HTN and hypothyroidism  Pharmacist Clinical Goal(s):  . BP <140/90 . TSH 0.35-4.5 . Receive Shingrix vaccine series over the next year  Interventions: . Comprehensive medication review performed. . Will file for patient assistance on COPD medications with patient  Patient Self Care Activities:  . Patient verbalizes understanding of plan to work with pharmacist on patient assistance . Please call me with any questions at [phone]  Initial goal documentation        Jenny [name] was given information about Chronic Care Management services today including:  1. CCM service includes personalized support from designated clinical staff supervised by her physician, including individualized plan of care and coordination with other care providers 2. 24/7 contact phone numbers for assistance for urgent and routine care needs. 3. Standard insurance, coinsurance, copays and deductibles apply for chronic care management only during months in which we provide at least 20 minutes of these services. Most insurances cover these services at 100%, however patients may be responsible for any copay, coinsurance and/or deductible if applicable. This service may help you avoid the need for more expensive face-to-face services. 4. Only one practitioner may furnish and bill the service in a calendar month. 5. The patient may stop CCM services at any time (effective at the end of the month) by phone call to the office staff.  Patient agreed to services and verbal consent obtained.   The patient verbalized understanding of instructions provided today and agreed to receive a mailed copy of patient instruction and/or educational materials. Telephone follow up appointment with pharmacy  team member scheduled for:   [name], Pharm.D. Clinical Pharmacist [facility] at [facility] [phone]   Hypertension, Adult High blood pressure (hypertension) is when the force of blood pumping through the arteries is too strong. The arteries are the blood vessels that carry blood from the heart throughout the body. Hypertension forces the heart to work harder to pump blood and may cause arteries to become narrow or stiff. Untreated or uncontrolled hypertension can cause a heart attack, heart failure, a stroke, kidney disease, and other problems. A blood pressure reading consists of a higher number over a lower number. Ideally, your blood pressure should be below 120/80. The first ("top") number is called the systolic pressure. It is a measure of the pressure in your arteries as your heart beats. The second ("bottom") number is called the diastolic pressure. It is a measure of the pressure in your arteries as the heart relaxes. What are the causes? The exact cause of this condition is not known. There are some conditions that result in or are related to high blood pressure. What increases the risk? Some risk factors for high blood pressure are under your control. The following factors may make you more likely to develop this condition:  Smoking.  Having type 2 diabetes mellitus, high cholesterol, or both.  Not getting enough exercise or physical activity.  Being overweight.  Having too much fat, sugar, calories, or salt (sodium) in your diet.  Drinking too much alcohol. Some risk factors for high blood pressure may be difficult or impossible to change. Some of these factors include:  Having chronic kidney disease.  Having a family history  of high blood pressure.  Age. Risk increases with age.  Race. You may be at higher risk if you are African American.  Gender. Men are at higher risk than women before age 64. After age 52, women are at higher risk  than men.  Having obstructive sleep apnea.  Stress. What are the signs or symptoms? High blood pressure may not cause symptoms. Very high blood pressure (hypertensive crisis) may cause:  Headache.  Anxiety.  Shortness of breath.  Nosebleed.  Nausea and vomiting.  Vision changes.  Severe chest pain.  Seizures. How is this diagnosed? This condition is diagnosed by measuring your blood pressure while you are seated, with your arm resting on a flat surface, your legs uncrossed, and your feet flat on the floor. The cuff of the blood pressure monitor will be placed directly against the skin of your upper arm at the level of your heart. It should be measured at least twice using the same arm. Certain conditions can cause a difference in blood pressure between your right and left arms. Certain factors can cause blood pressure readings to be lower or higher than normal for a short period of time:  When your blood pressure is higher when you are in a health care provider's office than when you are at home, this is called white coat hypertension. Most people with this condition do not need medicines.  When your blood pressure is higher at home than when you are in a health care provider's office, this is called masked hypertension. Most people with this condition may need medicines to control blood pressure. If you have a high blood pressure reading during one visit or you have normal blood pressure with other risk factors, you may be asked to:  Return on a different day to have your blood pressure checked again.  Monitor your blood pressure at home for 1 week or longer. If you are diagnosed with hypertension, you may have other blood or imaging tests to help your health care provider understand your overall risk for other conditions. How is this treated? This condition is treated by making healthy lifestyle changes, such as eating healthy foods, exercising more, and reducing your alcohol  intake. Your health care provider may prescribe medicine if lifestyle changes are not enough to get your blood pressure under control, and if:  Your systolic blood pressure is above 130.  Your diastolic blood pressure is above 80. Your personal target blood pressure may vary depending on your medical conditions, your age, and other factors. Follow these instructions at home: Eating and drinking   Eat a diet that is high in fiber and potassium, and low in sodium, added sugar, and fat. An example eating plan is called the DASH (Dietary Approaches to Stop Hypertension) diet. To eat this way: ? Eat plenty of fresh fruits and vegetables. Try to fill one half of your plate at each meal with fruits and vegetables. ? Eat whole grains, such as whole-wheat pasta, brown rice, or whole-grain bread. Fill about one fourth of your plate with whole grains. ? Eat or drink low-fat dairy products, such as skim milk or low-fat yogurt. ? Avoid fatty cuts of meat, processed or cured meats, and poultry with skin. Fill about one fourth of your plate with lean proteins, such as fish, chicken without skin, beans, eggs, or tofu. ? Avoid pre-made and processed foods. These tend to be higher in sodium, added sugar, and fat.  Reduce your daily sodium intake. Most people with  hypertension should eat less than 1,500 mg of sodium a day.  Do not drink alcohol if: ? Your health care provider tells you not to drink. ? You are pregnant, may be pregnant, or are planning to become pregnant.  If you drink alcohol: ? Limit how much you use to:  0-1 drink a day for women.  0-2 drinks a day for men. ? Be aware of how much alcohol is in your drink. In the [place] one drink equals one 12 oz bottle of beer (355 mL), one 5 oz glass of wine (148 mL), or one 1 oz glass of hard liquor (44 mL). Lifestyle   Work with your health care provider to maintain a healthy body weight or to lose weight. Ask what an ideal weight is for you.   Get at least 30 minutes of exercise most days of the week. Activities may include walking, swimming, or biking.  Include exercise to strengthen your muscles (resistance exercise), such as Pilates or lifting weights, as part of your weekly exercise routine. Try to do these types of exercises for 30 minutes at least 3 days a week.  Do not use any products that contain nicotine or tobacco, such as cigarettes, e-cigarettes, and chewing tobacco. If you need help quitting, ask your health care provider.  Monitor your blood pressure at home as told by your health care provider.  Keep all follow-up visits as told by your health care provider. This is important. Medicines  Take over-the-counter and prescription medicines only as told by your health care provider. Follow directions carefully. Blood pressure medicines must be taken as prescribed.  Do not skip doses of blood pressure medicine. Doing this puts you at risk for problems and can make the medicine less effective.  Ask your health care provider about side effects or reactions to medicines that you should watch for. Contact a health care provider if you:  Think you are having a reaction to a medicine you are taking.  Have headaches that keep coming back (recurring).  Feel dizzy.  Have swelling in your ankles.  Have trouble with your vision. Get help right away if you:  Develop a severe headache or confusion.  Have unusual weakness or numbness.  Feel faint.  Have severe pain in your chest or abdomen.  Vomit repeatedly.  Have trouble breathing. Summary  Hypertension is when the force of blood pumping through your arteries is too strong. If this condition is not controlled, it may put you at risk for serious complications.  Your personal target blood pressure may vary depending on your medical conditions, your age, and other factors. For most people, a normal blood pressure is less than 120/80.  Hypertension is treated with  lifestyle changes, medicines, or a combination of both. Lifestyle changes include losing weight, eating a healthy, low-sodium diet, exercising more, and limiting alcohol. This information is not intended to replace advice given to you by your health care provider. Make sure you discuss any questions you have with your health care provider. Document Revised: 06/08/2018 Document Reviewed: 06/08/2018 Elsevier Patient Education  2020 [employer].

## 2020-01-16 NOTE — Addendum Note (Signed)
Addended by: [name] Gourd on: 01/16/2020 02:59 PM   Modules accepted: Orders

## 2020-01-26 ENCOUNTER — Other Ambulatory Visit: Payer: Self-pay | Admitting: Family Medicine

## 2020-01-30 ENCOUNTER — Encounter: Payer: Self-pay | Admitting: Pulmonary Disease

## 2020-01-30 ENCOUNTER — Other Ambulatory Visit: Payer: Self-pay

## 2020-01-30 ENCOUNTER — Ambulatory Visit: Payer: Medicare HMO | Admitting: Pulmonary Disease

## 2020-01-30 VITALS — BP 120/80 | HR 93 | Ht 65.0 in | Wt 219.6 lb

## 2020-01-30 DIAGNOSIS — G4733 Obstructive sleep apnea (adult) (pediatric): Secondary | ICD-10-CM | POA: Diagnosis not present

## 2020-01-30 DIAGNOSIS — R05 Cough: Secondary | ICD-10-CM | POA: Diagnosis not present

## 2020-01-30 DIAGNOSIS — R053 Chronic cough: Secondary | ICD-10-CM

## 2020-01-30 DIAGNOSIS — J441 Chronic obstructive pulmonary disease with (acute) exacerbation: Secondary | ICD-10-CM | POA: Diagnosis not present

## 2020-01-30 DIAGNOSIS — J471 Bronchiectasis with (acute) exacerbation: Secondary | ICD-10-CM

## 2020-01-30 LAB — CBC WITH DIFFERENTIAL/PLATELET
Basophils Absolute: 0.1 10*3/uL (ref 0.0–0.1)
Basophils Relative: 0.9 % (ref 0.0–3.0)
Eosinophils Absolute: 0.6 10*3/uL (ref 0.0–0.7)
Eosinophils Relative: 6.5 % — ABNORMAL HIGH (ref 0.0–5.0)
HCT: 44.7 % (ref 36.0–46.0)
Hemoglobin: 14.6 g/dL (ref 12.0–15.0)
Lymphocytes Relative: 22 % (ref 12.0–46.0)
Lymphs Abs: 2 10*3/uL (ref 0.7–4.0)
MCHC: 32.7 g/dL (ref 30.0–36.0)
MCV: 92.4 fl (ref 78.0–100.0)
Monocytes Absolute: 0.9 10*3/uL (ref 0.1–1.0)
Monocytes Relative: 9.5 % (ref 3.0–12.0)
Neutro Abs: 5.6 10*3/uL (ref 1.4–7.7)
Neutrophils Relative %: 61.1 % (ref 43.0–77.0)
Platelets: 287 10*3/uL (ref 150.0–400.0)
RBC: 4.84 Mil/uL (ref 3.87–5.11)
RDW: 13.8 % (ref 11.5–15.5)
WBC: 9.1 10*3/uL (ref 4.0–10.5)

## 2020-01-30 LAB — COMPREHENSIVE METABOLIC PANEL
ALT: 12 U/L (ref 0–35)
AST: 18 U/L (ref 0–37)
Albumin: 3.9 g/dL (ref 3.5–5.2)
Alkaline Phosphatase: 88 U/L (ref 39–117)
BUN: 13 mg/dL (ref 6–23)
CO2: 29 mEq/L (ref 19–32)
Calcium: 9.3 mg/dL (ref 8.4–10.5)
Chloride: 106 mEq/L (ref 96–112)
Creatinine, Ser: 0.95 mg/dL (ref 0.40–1.20)
GFR: 56.43 mL/min — ABNORMAL LOW (ref >60.00)
Glucose, Bld: 106 mg/dL — ABNORMAL HIGH (ref 70–99)
Potassium: 4.2 mEq/L (ref 3.5–5.1)
Sodium: 140 mEq/L (ref 135–145)
Total Bilirubin: 0.3 mg/dL (ref 0.2–1.2)
Total Protein: 6.6 g/dL (ref 6.0–8.3)

## 2020-01-30 MED ORDER — AZITHROMYCIN 250 MG PO TABS: ORAL_TABLET | ORAL | 0 refills | Status: AC

## 2020-01-30 MED ORDER — METHYLPREDNISOLONE 4 MG PO TABS: ORAL_TABLET | ORAL | 0 refills | Status: AC

## 2020-01-30 NOTE — Patient Instructions (Signed)
Lab tests today  Will arrange for high resolution CT chest  Zithromax 250 mg pill >> 2 pills on day 1, then 1 pill daily for next 4 days  Medrol 4 mg pill >> 4 pills daily for 2 days, 3 pills daily for 2 days, 2 pills daily for 2 days, 1 pill daily for 2 days  Follow up in 2 weeks with Dr. Halford Chessman or Nurse Practitioner

## 2020-01-30 NOTE — Progress Notes (Signed)
[facility]  Chief Complaint  Patient presents with  . Follow-up    Pt is still having complaints of coughing with occ green phlegm, wheezing, and increased SOB.     Constitutional:  BP 120/80 (BP Location: Left Arm, Patient Position: Sitting, Cuff Size: Large)   Pulse 93   Ht 5\' 5"  (1.651 m)   Wt 219 lb 9.6 oz (99.6 kg)   SpO2 97% Comment: room air  BMI 36.54 kg/m   Past Medical History:  Vit D deficiency, Neuropathy, Low back pain, Hypothyroidism, HTN, GERD, Diverticulosis, Depression, Anxiety, DJD  Brief Summary:  Jenny [name] is a 79 y.o. female former smoker with OSA, and COPD with chronic bronchitis.  Subjective:  She completed COVID vaccination.  She has continued cough and sputum.  Bringing up green sputum.  Has wheezing also.  More short of breath.  No fever, sinus congestion, hemoptysis, or GI symptoms.  No skin rash or gland swelling.  Has been using symbicort bid.  Using albuterol 4 to 5 times per day.  These help some.  Had CXR 10/26/19 that showed hyperinflation (reviewed by me).  No recent lab work.  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 - singulair caused nightmares - intolerant of prednisone, but can use medrol - concerned she might has bronchiectasis - will give course of zithromax and medrol - continue symbicort and albuterol - will arrange for CMET, CBC with Diff, IgE, and high resolution CT chest  Allergic rhinitis with post nasal drip. - continue flonase, mucinex  Obstructive sleep apnea. - she is compliant with  CPAP - continue auto CPAP  A total of  32 minutes spent addressing patient care issues on day of visit.   Follow up:   Patient Instructions  Lab tests today  Will arrange for high resolution CT chest  Zithromax 250 mg pill >> 2 pills on day 1, then 1 pill daily for next 4 days  Medrol 4 mg pill >> 4 pills daily for 2 days, 3 pills daily for 2 days, 2 pills daily for 2 days, 1 pill daily for 2 days  Follow up in 2 weeks with Dr. Halford Chessman or Nurse Practitioner   Signature:  Chesley Mires, MD [place] Pager: [phone] 01/30/2020, 9:35 AM  Flow Sheet     Pulmonary tests:  Spirometry 04/21/18 >> FEV1 1.3 (64%), FEV1% 83  Sleep tests:  PSG April 2008 >> AHI 29, SpO2 low 62% Auto CPAP 11/07/19 to 12/05/18 >> used on 22 of 30 nights with average 8 hrs 40 min.  Average AHI 1.2 with median CPAP 7 and 95 th percentile CPAP 11 cm H2O.  Cardiac tests:  Echo 03/16/17 >> EF 60 to 65%  Medications:   Allergies as of 01/30/2020      Reactions   Prednisone Other (See Comments)   Confusion, dizziness Medrol is ok   Codeine Nausea Only       Tramadol Itching      Medication List       Accurate as of  January 30, 2020  9:35 AM. If you have any questions, ask your nurse or doctor.        STOP taking these medications   Durezol 0.05 % Emul Generic drug: Difluprednate Stopped by: Chesley Mires, MD   gabapentin 300 MG capsule Commonly known as: NEURONTIN Stopped by: Chesley Mires, MD     TAKE these medications   albuterol 108 (90 Base) MCG/ACT inhaler Commonly known as: VENTOLIN HFA Inhale 2 puffs into the lungs every 6 (six) hours as needed. For wheezing   ALPRAZolam 0.5 MG tablet Commonly known as: XANAX TAKE 1/2 TO 1 TABLET BY MOUTH THREE TIMES DAILY AS NEEDED FOR ANXIETY   amLODipine 5 MG tablet Commonly known as: NORVASC TAKE 1 TABLET BY MOUTH EVERY DAY   azithromycin 250 MG tablet Commonly known as: ZITHROMAX Take 2 tablets (500 mg total) by  mouth daily for 1 day, THEN 1 tablet (250 mg total) daily for 4 days. Start taking on: January 30, 2020 Started by: Chesley Mires, MD   benzonatate 200 MG capsule Commonly known as: TESSALON Take 1 capsule (200 mg total) by mouth 3 (three) times daily as needed for cough.   budesonide-formoterol 160-4.5 MCG/ACT inhaler Commonly known as: SYMBICORT Inhale 2 puffs into the lungs 2 (two) times daily.   escitalopram 5 MG tablet Commonly known as: LEXAPRO TAKE 1 TABLET(5 MG) BY MOUTH DAILY   fluticasone 50 MCG/ACT nasal spray Commonly known as: FLONASE Place 1 spray into both nostrils daily.   Flutter Devi 1 Device by Does not apply route in the morning, at noon, and at bedtime.   furosemide 20 MG tablet Commonly known as: LASIX Take 20 mg by mouth. Take 2 tablet daily as needed   ibuprofen 200 MG tablet Commonly known as: ADVIL Take 400 mg by mouth every 6 (six) hours as needed for moderate pain.   ipratropium-albuterol 0.5-2.5 (3) MG/3ML Soln Commonly known as: DUONEB USE 3 ML VIA NEBULIZER FOUR TIMES DAILY   levothyroxine 112 MCG tablet Commonly known as: SYNTHROID Take 1 tablet (112 mcg total) by mouth daily.   methylPREDNISolone 4 MG tablet Commonly known as: Medrol Take 4 tablets (16 mg total) by mouth daily for 2 days, THEN 3 tablets (12 mg total) daily for 2 days, THEN 2 tablets (8 mg total) daily for 2 days, THEN 1 tablet (4 mg total) daily for 2 days. Start taking on: January 30, 2020 Started by: Chesley Mires, MD   Mucus Relief 400 MG Tabs tablet Generic drug: guaifenesin Take 400 mg by mouth 2 (two) times a day.   pantoprazole 40 MG tablet Commonly known as: PROTONIX TAKE 1 TABLET BY MOUTH EVERY DAY 30 MINUTES BEFORE DINNER   Vitamin D (Ergocalciferol) 1.25 MG (50000 UNIT) Caps capsule Commonly known as: DRISDOL Take 1 capsule (50,000 Units total) by mouth every 7 (seven) days.       Past Surgical History:  She  has a past surgical history that includes  Appendectomy; Tonsillectomy and adenoidectomy; AP repair and sling for cystocele (8-04 ); left hand (2010); ctr; Shoulder arthroscopy distal clavicle excision and open rotator cuff repair; Neck surgery; Abdominal hysterectomy; Cholecystectomy; carpal tunel (2008); and Tubal ligation.  Family History:  Her family history includes Anxiety disorder in her daughter; Arthritis in her brother and mother; Asthma in her daughter; COPD in her mother; Cancer in her maternal aunt, maternal aunt, and paternal grandmother; Colon polyps in her daughter; Dementia in her father; Heart disease in her maternal grandfather, maternal  grandmother, and mother; Hypertension in her mother; Kidney disease in her paternal grandfather; Other in her maternal grandfather, mother, and paternal grandfather; Pneumonia in her mother; Stroke in her maternal grandmother.  Social History:  She  reports that she quit smoking about 28 years ago. Her smoking use included cigarettes. She has a 45.00 pack-year smoking history. She has never used smokeless tobacco. She reports that she does not drink alcohol or use drugs.

## 2020-01-31 LAB — IGE: IgE (Immunoglobulin E), Serum: 40 kU/L (ref <=114)

## 2020-02-02 ENCOUNTER — Telehealth: Payer: Self-pay | Admitting: Pulmonary Disease

## 2020-02-02 NOTE — Telephone Encounter (Signed)
CMP Latest Ref Rng & Units 01/30/2020 11/09/2019 03/02/2019  Glucose 70 - 99 mg/dL 106(H) 114(H) 91  BUN 6 - 23 mg/dL 13 13 12   Creatinine 0.40 - 1.20 mg/dL 0.95 0.93 0.85  Sodium 135 - 145 mEq/L 140 138 139  Potassium 3.5 - 5.1 mEq/L 4.2 4.8 5.0  Chloride 96 - 112 mEq/L 106 103 104  CO2 19 - 32 mEq/L 29 30 30   Calcium 8.4 - 10.5 mg/dL 9.3 8.8 8.9  Total Protein 6.0 - 8.3 g/dL 6.6 6.0 6.3  Total Bilirubin 0.2 - 1.2 mg/dL 0.3 0.5 0.4  Alkaline Phos 39 - 117 U/L 88 77 73  AST 0 - 37 U/L 18 14 13   ALT 0 - 35 U/L 12 11 8     CBC    Component Value Date/Time   WBC 9.1 01/30/2020 0939   RBC 4.84 01/30/2020 0939   HGB 14.6 01/30/2020 0939   HCT 44.7 01/30/2020 0939   PLT 287.0 01/30/2020 0939   MCV 92.4 01/30/2020 0939   MCH 28.9 02/15/2015 1617   MCHC 32.7 01/30/2020 0939   RDW 13.8 01/30/2020 0939   LYMPHSABS 2.0 01/30/2020 0939   MONOABS 0.9 01/30/2020 0939   EOSABS 0.6 01/30/2020 0939   BASOSABS 0.1 01/30/2020 0939    IgE 01/30/20 >> 40   Please let her know lab tests were normal.

## 2020-02-07 ENCOUNTER — Other Ambulatory Visit: Payer: Self-pay | Admitting: Physician Assistant

## 2020-02-07 NOTE — Telephone Encounter (Signed)
Spoke with patient. She was made aware of results and verbalized understanding.   Nothing further needed at time of call.  

## 2020-02-07 NOTE — Telephone Encounter (Signed)
Alprazolam 0.5mg  Last filled 01/11/20 #90 with Daneil Dolin Last office visit 11/06/19 with no future appt schedule

## 2020-02-09 ENCOUNTER — Other Ambulatory Visit: Payer: Self-pay

## 2020-02-09 ENCOUNTER — Ambulatory Visit (INDEPENDENT_AMBULATORY_CARE_PROVIDER_SITE_OTHER)
Admission: RE | Admit: 2020-02-09 | Discharge: 2020-02-09 | Disposition: A | Discharge status: Home / Self Care | Payer: Medicare HMO | Source: Ambulatory Visit | Attending: Pulmonary Disease | Admitting: Pulmonary Disease

## 2020-02-09 DIAGNOSIS — J441 Chronic obstructive pulmonary disease with (acute) exacerbation: Secondary | ICD-10-CM | POA: Diagnosis not present

## 2020-02-09 DIAGNOSIS — R05 Cough: Secondary | ICD-10-CM

## 2020-02-09 DIAGNOSIS — J471 Bronchiectasis with (acute) exacerbation: Secondary | ICD-10-CM | POA: Diagnosis not present

## 2020-02-09 DIAGNOSIS — R053 Chronic cough: Secondary | ICD-10-CM

## 2020-02-11 DIAGNOSIS — G4733 Obstructive sleep apnea (adult) (pediatric): Secondary | ICD-10-CM | POA: Diagnosis not present

## 2020-02-13 ENCOUNTER — Encounter (HOSPITAL_COMMUNITY): Payer: Self-pay | Admitting: *Deleted

## 2020-02-13 ENCOUNTER — Ambulatory Visit: Payer: Medicare HMO | Admitting: Adult Health

## 2020-02-13 ENCOUNTER — Encounter: Payer: Self-pay | Admitting: Adult Health

## 2020-02-13 ENCOUNTER — Other Ambulatory Visit: Payer: Self-pay

## 2020-02-13 VITALS — BP 136/88 | HR 95 | Ht 65.0 in | Wt 219.0 lb

## 2020-02-13 DIAGNOSIS — G4733 Obstructive sleep apnea (adult) (pediatric): Secondary | ICD-10-CM

## 2020-02-13 DIAGNOSIS — J309 Allergic rhinitis, unspecified: Secondary | ICD-10-CM | POA: Diagnosis not present

## 2020-02-13 DIAGNOSIS — J449 Chronic obstructive pulmonary disease, unspecified: Secondary | ICD-10-CM

## 2020-02-13 DIAGNOSIS — E66812 Obesity, class 2: Secondary | ICD-10-CM

## 2020-02-13 NOTE — Progress Notes (Addendum)
@Patient  ID: Jenny [name], female    DOB: [birth year], 79 y.o.   MRN: [id number]  Chief Complaint  Patient presents with  . Follow-up    Asthma    Referring provider: Midge Minium, MD  HPI: 79 yo female former smoker (quit 1993) followed for COPD , AR , OSA  Medical history significant for HTN, Chronic Back pain-previous back surgery .   TEST/EVENTS :  Spirometry 04/21/18 >> FEV1 1.3 (64%), FEV1% 83  PSG April 2008 >> AHI 29, SpO2 low 62% Auto CPAP 11/07/19 to 12/05/18 >> used on 22 of 30 nights with average 8 hrs 40 min.  Average AHI 1.2 with median CPAP 7 and 95 th percentile CPAP 11 cm H2O.  Echo 03/16/17 >> EF 60 to 65%  02/13/2020 Follow up : COPD, AR , OSA  Patient returns for a 2-week follow-up.  Patient had a COPD flare last visit was given a Z-Pak Medrol Dosepak.  Patient is prone to recurrent exacerbations.  She carries a diagnosis of chronic bronchitis.  She has a history of former smoker quit in 1993.  Previous spirometry showed moderate restriction with an FEV1 at 64% ratio was 83. She does have sinus drainage nasal congestion.  Lab work showed elevated eosinophils at absolute count of 600.  She complains that she has a recurrent cough.  She is taking Zyrtec and Flonase.  Continues to have recurrent flares.  She says she does feel some better since taking a Z-Pak but she says her cough and congestion and wheezing are starting to come back.  She says she still gets short of breath with minimal activity.  She has chronic back pain which limits her ability to be more active.  She does still work some part-time.  She remains on Symbicort twice daily.  She has tried Singulair and Advair in the past with no perceived benefit.  She takes DuoNeb 3 times a day.  She does have trouble affording her medications and is currently looking into a patient assistance. IgE was 40.Marland Kitchen Patient had a high-resolution CT chest that showed no bronchiectasis or honeycombing.  There was mild patchy  subpleural reticulation and groundglass opacity in both lungs with a mild basilar predominance.  Patient does have underlying sleep apnea.  She is on CPAP at night.  She says she wears it all night.  She says she does have to get up a lot to go to the bathroom at nighttime.  She does feel somewhat rested.  With no excessive daytime sleepiness.  Allergies  Allergen Reactions  . Prednisone Other (See Comments)    Confusion, dizziness Medrol is ok  . Codeine Nausea Only       . Tramadol Itching    Immunization History  Administered Date(s) Administered  . Fluad Quad(high Dose 65+) 08/02/2019  . Influenza Split 08/20/2011, 09/30/2012  . Influenza Whole 08/13/2013  . Influenza, High Dose Seasonal PF 10/13/2014, 09/03/2015, 06/03/2017, 09/25/2018  . Influenza-Unspecified 07/11/2016  . Moderna SARS-COVID-2 Vaccination 11/17/2019, 12/24/2019  . Pneumococcal Conjugate-13 03/16/2014  . Pneumococcal Polysaccharide-23 06/23/2016  . Tdap 09/22/2011  . Zoster 03/16/2014    Past Medical History:  Diagnosis Date  . ALLERGIC RHINITIS 08/23/2007  . Allergy    rhinitis  . 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 Anemia 09/21/2011  . Annual physical exam 08/06/2013  . Asthma    mild, intermittent  . Asthma 01/14/2011  . Atrophic vaginitis 06/17/2012  . BACK PAIN, LUMBAR 08/23/2007  . Bronchitis   .  BRONCHITIS, RECURRENT 02/22/2008  . Cough    usually not productive  . Degenerative joint disease   . DEGENERATIVE JOINT DISEASE 02/22/2008  . Depression with anxiety 03/06/2008   Qualifier: Diagnosis of  By: Lenna Gilford MD, Deborra Medina   . Diverticulosis of colon   . DIVERTICULOSIS OF COLON 02/22/2008  . Dysthymia   . 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  . elevated glucose 08/21/2008  . GERD 08/23/2007  . GERD (gastroesophageal reflux disease)   . HAND PAIN 04/09/2010  . History of chicken pox 01/14/2011  . History of measles 01/14/2011  . Hyperglycemia 07/07/2015  . Hypertension   . HYPERTENSION 02/22/2008  . Hypothyroidism    . HYPOTHYROIDISM 08/23/2007  . Incontinence of urine   . Lumbar back pain   . Obesity   . OBESITY 08/23/2007  . Obstructive sleep apnea    does not use CPAP, lost weight  . Other urinary incontinence 03/12/2010  . Peripheral neuropathy 02/12/2011   possibly related to spondylolisthesis per pt  . PONV (postoperative nausea and vomiting)   . Sleep apnea   . SLEEP APNEA, OBSTRUCTIVE 08/23/2007  . Transient memory loss March 2006   after a stressful event she had no memory the rest of that day  . Urinary frequency   . Urinary incontinence   . UTI (lower urinary tract infection) 03/23/2012  . Venous insufficiency   . VENOUS INSUFFICIENCY 08/23/2007  . Vitamin D deficiency   . VITAMIN D DEFICIENCY 08/21/2008    Tobacco History: Social History   Tobacco Use  Smoking Status Former Smoker  . Packs/day: 1.50  . Years: 30.00  . Pack years: 45.00  . Types: Cigarettes  . Quit date: 10/13/1991  . Years since quitting: 28.3  Smokeless Tobacco Never Used   Counseling given: Not Answered   Outpatient Medications Prior to Visit  Medication Sig Dispense Refill  . albuterol (VENTOLIN HFA) 108 (90 Base) MCG/ACT inhaler Inhale 2 puffs into the lungs every 6 (six) hours as needed. For wheezing 6.7 g 5  . ALPRAZolam (XANAX) 0.5 MG tablet TAKE 1/2 TO 1 TABLET BY MOUTH THREE TIMES DAILY AS NEEDED FOR ANXIETY 90 tablet 0  . amLODipine (NORVASC) 5 MG tablet TAKE 1 TABLET BY MOUTH EVERY DAY 90 tablet 0  . benzonatate (TESSALON) 200 MG capsule Take 1 capsule (200 mg total) by mouth 3 (three) times daily as needed for cough. 45 capsule 3  . budesonide-formoterol (SYMBICORT) 160-4.5 MCG/ACT inhaler Inhale 2 puffs into the lungs 2 (two) times daily. 1 Inhaler 6  . escitalopram (LEXAPRO) 5 MG tablet TAKE 1 TABLET(5 MG) BY MOUTH DAILY 90 tablet 0  . fluticasone (FLONASE) 50 MCG/ACT nasal spray Place 1 spray into both nostrils daily. 16 g 2  . furosemide (LASIX) 20 MG tablet Take 20 mg by mouth. Take 2  tablet daily as needed    . guaifenesin (MUCUS RELIEF) 400 MG TABS tablet Take 400 mg by mouth 2 (two) times a day.    . ibuprofen (ADVIL,MOTRIN) 200 MG tablet Take 400 mg by mouth every 6 (six) hours as needed for moderate pain.    Marland Kitchen ipratropium-albuterol (DUONEB) 0.5-2.5 (3) MG/3ML SOLN USE 3 ML VIA NEBULIZER FOUR TIMES DAILY 360 mL 11  . levothyroxine (SYNTHROID) 112 MCG tablet Take 1 tablet (112 mcg total) by mouth daily. 90 tablet 1  . pantoprazole (PROTONIX) 40 MG tablet TAKE 1 TABLET BY MOUTH EVERY DAY 30 MINUTES BEFORE DINNER 90 tablet 1  . Respiratory Therapy Supplies (FLUTTER) DEVI 1 Device  by Does not apply route in the morning, at noon, and at bedtime. 1 each 0  . Vitamin D, Ergocalciferol, (DRISDOL) 1.25 MG (50000 UNIT) CAPS capsule Take 1 capsule (50,000 Units total) by mouth every 7 (seven) days. 4 capsule 3   No facility-administered medications prior to visit.     Review of Systems:   Constitutional:   No  weight loss, night sweats,  Fevers, chills, fatigue, or  lassitude.  HEENT:   No headaches,  Difficulty swallowing,  Tooth/dental problems, or  Sore throat,                No sneezing, itching, ear ache, + nasal congestion, post nasal drip,   CV:  No chest pain,  Orthopnea, PND, +swelling in lower extremities, no anasarca, dizziness, palpitations, syncope.   GI  No heartburn, indigestion, abdominal pain, nausea, vomiting, diarrhea, change in bowel habits, loss of appetite, bloody stools.   Resp   No chest wall deformity  Skin: no rash or lesions.  GU: no dysuria, change in color of urine, no urgency or frequency.  No flank pain, no hematuria   MS:  No joint pain or swelling.  No decreased range of motion.  No back pain.    Physical Exam  BP 136/88 (BP Location: Left Arm, Cuff Size: Normal)   Pulse 95   Ht 5\' 5"  (1.651 m)   Wt 219 lb (99.3 kg)   SpO2 96%   BMI 36.44 kg/m   GEN: A/Ox3; pleasant , NAD, BMI 36   HEENT:  [place]/AT,  NOSE-clear, THROAT-clear,  no lesions, no postnasal drip or exudate noted.   NECK:  Supple w/ fair ROM; no JVD; normal carotid impulses w/o bruits; no thyromegaly or nodules palpated; no lymphadenopathy.    RESP  Clear  P & A; w/o, wheezes/ rales/ or rhonchi. no accessory muscle use, no dullness to percussion  CARD:  RRR, no m/r/g, 1+peripheral edema, pulses intact, no cyanosis or clubbing.  GI:   Soft & nt; nml bowel sounds; no organomegaly or masses detected.   Musco: Warm bil, no deformities or joint swelling noted.   Neuro: alert, no focal deficits noted.    Skin: Warm, no lesions or rashes    Lab Results:  CBC    Component Value Date/Time   WBC 9.1 01/30/2020 0939   RBC 4.84 01/30/2020 0939   HGB 14.6 01/30/2020 0939   HCT 44.7 01/30/2020 0939   PLT 287.0 01/30/2020 0939   MCV 92.4 01/30/2020 0939   MCH 28.9 02/15/2015 1617   MCHC 32.7 01/30/2020 0939   RDW 13.8 01/30/2020 0939   LYMPHSABS 2.0 01/30/2020 0939   MONOABS 0.9 01/30/2020 0939   EOSABS 0.6 01/30/2020 0939   BASOSABS 0.1 01/30/2020 0939    BMET    Component Value Date/Time   NA 140 01/30/2020 0939   K 4.2 01/30/2020 0939   CL 106 01/30/2020 0939   CO2 29 01/30/2020 0939   GLUCOSE 106 (H) 01/30/2020 0939   BUN 13 01/30/2020 0939   CREATININE 0.95 01/30/2020 0939   CREATININE 1.00 (H) 12/17/2016 1521   CALCIUM 9.3 01/30/2020 0939   GFRNONAA 56 (L) 08/27/2014 1923   GFRAA 65 (L) 08/27/2014 1923    BNP No results found for: BNP  ProBNP    Component Value Date/Time   PROBNP 35.0 03/15/2017 1113    Imaging: CT Chest High Resolution  Result Date: 02/09/2020 CLINICAL DATA:  Worsening dyspnea since October. Symptoms improved on prednisone and antibiotic  therapy. COPD exacerbation. EXAM: CT CHEST WITHOUT CONTRAST TECHNIQUE: Multidetector CT imaging of the chest was performed following the standard protocol without intravenous contrast. High resolution imaging of the lungs, as well as inspiratory and expiratory imaging,  was performed. COMPARISON:  10/26/2019 01/18/2004 chest CT. Chest radiograph. FINDINGS: Cardiovascular: Normal heart size. No significant pericardial effusion/thickening. Three-vessel coronary atherosclerosis. Atherosclerotic nonaneurysmal thoracic aorta. Normal caliber pulmonary arteries. Mediastinum/Nodes: Thyroid is either atrophic or surgically absent. Unremarkable esophagus. No pathologically enlarged axillary, mediastinal or hilar lymph nodes, noting limited sensitivity for the detection of hilar adenopathy on this noncontrast study. Lungs/Pleura: No pneumothorax. No pleural effusion. No acute consolidative airspace disease or lung masses. Subpleural solid 3 mm posterior left lower lobe pulmonary nodule (series 3/image 111). No additional significant pulmonary nodules. No significant air trapping or evidence of tracheobronchomalacia on the expiration sequence. There is mild patchy subpleural reticulation and ground-glass opacity in both lungs with a mild basilar predominance. No significant regions traction bronchiectasis, architectural distortion or frank honeycombing. These findings appear new since 01/18/2004 chest CT. Upper abdomen: Cholecystectomy. Musculoskeletal: No aggressive appearing focal osseous lesions. Moderate thoracic spondylosis. IMPRESSION: 1. Mild patchy subpleural reticulation and ground-glass opacity in both lungs with a mild basilar predominance. No bronchiectasis or honeycombing. Findings are new since 01/18/2004 chest CT. Findings may represent interstitial lung disease, with differential including mild nonspecific interstitial pneumonia (NSIP) or early usual interstitial pneumonia (UIP). Follow-up high-resolution chest CT study could be obtained in 12 months to assess temporal pattern stability, as clinically warranted. Findings are indeterminate for UIP per consensus guidelines: Diagnosis of Idiopathic Pulmonary Fibrosis: An Official ATS/ERS/JRS/ALAT Clinical Practice Guideline. [place], Iss 5, [phone], Jun 12 2017. 2. Left lower lobe 3 mm subpleural solid pulmonary nodule. No follow-up needed if patient is low-risk. Non-contrast chest CT can be considered in 12 months if patient is high-risk. This recommendation follows the consensus statement: Guidelines for Management of Incidental Pulmonary Nodules Detected on CT Images: From the Fleischner Society 2017; Radiology 2017; 284:228-243. 3. Three-vessel coronary atherosclerosis. 4. Aortic Atherosclerosis (ICD10-I70.0). Electronically Signed   By: Ilona Sorrel M.D.   On: 02/09/2020 10:56      No flowsheet data found.  No results found for: NITRICOXIDE      Assessment & Plan:   COPD mixed type ([place]) Chronic bronchitis-suspect patient has an asthma component as she has more restriction than obstruction on her PFTs in 2019.  Also eosinophils are elevated.  And she has associated allergic rhinitis. We will repeat full PFTs. Patient is on maximum therapy with Symbicort and DuoNeb.  Will hold off on changing to triple therapy inhaler as patient has difficulty affording her medications.  This seems to be a more affordable regimen at this time.  May need to look at possibly adding an Dupixent as her eosinophils were elevated.  We will decide on return once PFTs are done.  Also high-resolution CT did show some mild basilar reticulation possible NSIP.  This could also be contributing to her ongoing cough. Previous sputum culture was negative. She is prone to frequent flares and receives antibiotics and steroids frequently. Also deconditioning seems to be a component of her her ongoing shortness of breath.  Will refer to pulmonary rehab  Plan  Patient Instructions  Mucinex Twice daily  As needed  Cough/congestion  Delsym 2 tsp Twice daily  For cough As needed   Tessalon Three times a day  As needed  Cough .  Zyrtec 10mg  daily  Flonase daily .  Continue on Symbicort 2 puffs Twice daily  -rinse after  use.  Duoneb Three times a day  . Continue on CPAP At bedtime  .  Work on healthy weight loss. Do not drive if sleepy .   Refer to Pulmonary Rehab .  Follow up with Dr. Halford Chessman  Or Asiah Befort NP with PFT in 4-6 weeks weeks and As needed   Please contact office for sooner follow up if symptoms do not improve or worsen or seek emergency care       Obstructive sleep apnea Continue on nocturnal CPAP. Work on healthy weight.  Obesity Healthy weight loss  Allergic rhinitis Continue on Flonase and Zyrtec.     Rexene Edison, NP 02/13/2020

## 2020-02-13 NOTE — Assessment & Plan Note (Signed)
Continue on Flonase and Zyrtec.

## 2020-02-13 NOTE — Assessment & Plan Note (Signed)
Continue on nocturnal CPAP. Work on healthy weight.

## 2020-02-13 NOTE — Assessment & Plan Note (Signed)
Chronic bronchitis-suspect patient has an asthma component as she has more restriction than obstruction on her PFTs in 2019.  Also eosinophils are elevated.  And she has associated allergic rhinitis. We will repeat full PFTs. Patient is on maximum therapy with Symbicort and DuoNeb.  Will hold off on changing to triple therapy inhaler as patient has difficulty affording her medications.  This seems to be a more affordable regimen at this time.  May need to look at possibly adding an Dupixent as her eosinophils were elevated.  We will decide on return once PFTs are done.  Also high-resolution CT did show some mild basilar reticulation possible NSIP.  This could also be contributing to her ongoing cough. Previous sputum culture was negative. She is prone to frequent flares and receives antibiotics and steroids frequently. Also deconditioning seems to be a component of her her ongoing shortness of breath.  Will refer to pulmonary rehab  Plan  Patient Instructions  Mucinex Twice daily  As needed  Cough/congestion  Delsym 2 tsp Twice daily  For cough As needed   Tessalon Three times a day  As needed  Cough .  Zyrtec 10mg  daily  Flonase daily .  Continue on Symbicort 2 puffs Twice daily  -rinse after use.  Duoneb Three times a day  . Continue on CPAP At bedtime  .  Work on healthy weight loss. Do not drive if sleepy .   Refer to Pulmonary Rehab .  Follow up with Dr. Halford Chessman  Or Antwaine Boomhower NP with PFT in 4-6 weeks weeks and As needed   Please contact office for sooner follow up if symptoms do not improve or worsen or seek emergency care

## 2020-02-13 NOTE — Progress Notes (Signed)
Received referral from Dr. Halford Chessman for this pt to participate in pulmonary rehab with the the diagnosis of COPD mixed type. Clinical review of pt follow up appt on 5/4 with Rexene Edison NP with Dr. Halford Chessman Pulmonary office note.  Pt with Covid Risk Score - 6. Pt appropriate for scheduling for Pulmonary rehab.  Will forward to support staff for verification of insurance eligibility/benefits and pulmonary rehab staff for scheduling with pt consent. Cherre Huger, BSN Cardiac and Training and development officer

## 2020-02-13 NOTE — Patient Instructions (Addendum)
Mucinex Twice daily  As needed  Cough/congestion  Delsym 2 tsp Twice daily  For cough As needed   Tessalon Three times a day  As needed  Cough .  Zyrtec 10mg  daily  Flonase daily .  Continue on Symbicort 2 puffs Twice daily  -rinse after use.  Duoneb Three times a day  . Continue on CPAP At bedtime  .  Work on healthy weight loss. Do not drive if sleepy .   Refer to Pulmonary Rehab .  Follow up with Dr. Halford Chessman  Or Randee Huston NP with PFT in 4-6 weeks weeks and As needed   Please contact office for sooner follow up if symptoms do not improve or worsen or seek emergency care

## 2020-02-13 NOTE — Assessment & Plan Note (Signed)
Healthy weight loss 

## 2020-02-13 NOTE — Progress Notes (Signed)
Reviewed and agree with assessment/plan.   Lindley Stachnik, MD [facility] 10/07/2016, 12:24 PM Pager:  [phone]  

## 2020-02-16 ENCOUNTER — Telehealth (HOSPITAL_COMMUNITY): Payer: Self-pay

## 2020-02-16 NOTE — Telephone Encounter (Signed)
Pt insurance is active and benefits verified through [place] $30, DED 0/0 met, out of pocket $5,000/$352.86 met, co-insurance 0%. no pre-authorization required ,[place]/[employer] 02/26/2020@8 :41am, REF# [id number]

## 2020-02-21 ENCOUNTER — Telehealth (HOSPITAL_COMMUNITY): Payer: Self-pay | Admitting: *Deleted

## 2020-02-21 ENCOUNTER — Other Ambulatory Visit: Payer: Self-pay | Admitting: General Practice

## 2020-02-21 MED ORDER — ESCITALOPRAM OXALATE 5 MG PO TABS: ORAL_TABLET | ORAL | 0 refills | Status: DC

## 2020-02-28 ENCOUNTER — Other Ambulatory Visit: Payer: Self-pay | Admitting: Family Medicine

## 2020-03-08 MED ORDER — METHYLPREDNISOLONE 4 MG PO TABS
ORAL_TABLET | ORAL | 0 refills | Status: DC
Start: 2020-03-08 — End: 2020-04-01

## 2020-03-08 NOTE — Telephone Encounter (Signed)
Tammy please advise. Thanks. 

## 2020-03-08 NOTE — Telephone Encounter (Signed)
Complains of 1 week of increased cough, wheezing and increased dyspnea, increased albuterol .   Medrol 4 mg pill >> 4 pills daily for 2 days, 3 pills daily for 2 days, 2 pills daily for 2 days, 1 pill daily for 2 days and stop .   Please send in rx.   Advised if not improving will need sooner ov .  Please contact office for sooner follow up if symptoms do not improve or worsen or seek emergency care    [place]chedule PFT in July .

## 2020-03-08 NOTE — Telephone Encounter (Signed)
ATC patient to let her know that RX was being sent in. Left message letting her know that it was sent to [facility] in [place]. Nothing further needed at this time.

## 2020-03-11 ENCOUNTER — Other Ambulatory Visit: Payer: Self-pay | Admitting: Family Medicine

## 2020-03-12 NOTE — Telephone Encounter (Signed)
Last OV 11/06/19 Alprazolam last filled 02/09/20 #90 with 0

## 2020-03-14 DIAGNOSIS — H16223 Keratoconjunctivitis sicca, not specified as Sjogren's, bilateral: Secondary | ICD-10-CM | POA: Diagnosis not present

## 2020-03-14 DIAGNOSIS — H26493 Other secondary cataract, bilateral: Secondary | ICD-10-CM | POA: Diagnosis not present

## 2020-04-01 ENCOUNTER — Ambulatory Visit (INDEPENDENT_AMBULATORY_CARE_PROVIDER_SITE_OTHER): Payer: Medicare HMO

## 2020-04-01 ENCOUNTER — Other Ambulatory Visit: Payer: Self-pay

## 2020-04-01 ENCOUNTER — Telehealth: Payer: Self-pay

## 2020-04-01 VITALS — Ht 65.0 in | Wt 219.0 lb

## 2020-04-01 DIAGNOSIS — Z0000 Encounter for general adult medical examination without abnormal findings: Secondary | ICD-10-CM | POA: Diagnosis not present

## 2020-04-01 NOTE — Patient Instructions (Signed)
Jenny [name] , Thank you for taking time to come for your Medicare Wellness Visit. I appreciate your ongoing commitment to your health goals. Please review the following plan we discussed and let me know if I can assist you in the future.   Screening recommendations/referrals: Colonoscopy: Completed 08/24/2016-No longer indicated Mammogram: Completed 06/29/2017-Declined -No longer indicated Bone Density: Completed 06/30/3027-Declined Recommended yearly ophthalmology/optometry visit for glaucoma screening and checkup Recommended yearly dental visit for hygiene and checkup  Vaccinations: Influenza vaccine: Due 06/2020 Pneumococcal vaccine: Completed 06/23/2016 Tdap vaccine: Up to date 09/22/2011-Next Due-09/21/2021 Shingles vaccine: Discuss with phamrmacy  Covid-19:Completed  12/24/2019  Advanced directives: Bring a copy to next office visit  Conditions/risks identified: See problem list. I will follow up with pharmacist regarding assistance with cost of Symbicort.  Next appointment: Follow up in one year for your annual wellness visit    Preventive Care 65 Years and Older, Female Preventive care refers to lifestyle choices and visits with your health care provider that can promote health and wellness. What does preventive care include?  A yearly physical exam. This is also called an annual well check.  Dental exams once or twice a year.  Routine eye exams. Ask your health care provider how often you should have your eyes checked.  Personal lifestyle choices, including:  Daily care of your teeth and gums.  Regular physical activity.  Eating a healthy diet.  Avoiding tobacco and drug use.  Limiting alcohol use.  Practicing safe sex.  Taking low-dose aspirin every day.  Taking vitamin and mineral supplements as recommended by your health care provider. What happens during an annual well check? The services and screenings done by your health care provider during your annual  well check will depend on your age, overall health, lifestyle risk factors, and family history of disease. Counseling  Your health care provider may ask you questions about your:  Alcohol use.  Tobacco use.  Drug use.  Emotional well-being.  Home and relationship well-being.  Sexual activity.  Eating habits.  History of falls.  Memory and ability to understand (cognition).  Work and work Statistician.  Reproductive health. Screening  You may have the following tests or measurements:  Height, weight, and BMI.  Blood pressure.  Lipid and cholesterol levels. These may be checked every 5 years, or more frequently if you are over 61 years old.  Skin check.  Lung cancer screening. You may have this screening every year starting at age 88 if you have a 30-pack-year history of smoking and currently smoke or have quit within the past 15 years.  Fecal occult blood test (FOBT) of the stool. You may have this test every year starting at age 28.  Flexible sigmoidoscopy or colonoscopy. You may have a sigmoidoscopy every 5 years or a colonoscopy every 10 years starting at age 54.  Hepatitis C blood test.  Hepatitis B blood test.  Sexually transmitted disease (STD) testing.  Diabetes screening. This is done by checking your blood sugar (glucose) after you have not eaten for a while (fasting). You may have this done every 1-3 years.  Bone density scan. This is done to screen for osteoporosis. You may have this done starting at age 46.  Mammogram. This may be done every 1-2 years. Talk to your health care provider about how often you should have regular mammograms. Talk with your health care provider about your test results, treatment options, and if necessary, the need for more tests. Vaccines  Your health care provider may  recommend certain vaccines, such as:  Influenza vaccine. This is recommended every year.  Tetanus, diphtheria, and acellular pertussis (Tdap, Td) vaccine.  You may need a Td booster every 10 years.  Zoster vaccine. You may need this after age 27.  Pneumococcal 13-valent conjugate (PCV13) vaccine. One dose is recommended after age 71.  Pneumococcal polysaccharide (PPSV23) vaccine. One dose is recommended after age 46. Talk to your health care provider about which screenings and vaccines you need and how often you need them. This information is not intended to replace advice given to you by your health care provider. Make sure you discuss any questions you have with your health care provider. Document Released: 10/25/2015 Document Revised: 06/17/2016 Document Reviewed: 07/30/2015 Elsevier Interactive Patient Education  2017 [place] Prevention in the Home Falls can cause injuries. They can happen to people of all ages. There are many things you can do to make your home safe and to help prevent falls. What can I do on the outside of my home?  Regularly fix the edges of walkways and driveways and fix any cracks.  Remove anything that might make you trip as you walk through a door, such as a raised step or threshold.  Trim any bushes or trees on the path to your home.  Use bright outdoor lighting.  Clear any walking paths of anything that might make someone trip, such as rocks or tools.  Regularly check to see if handrails are loose or broken. Make sure that both sides of any steps have handrails.  Any raised decks and porches should have guardrails on the edges.  Have any leaves, snow, or ice cleared regularly.  Use sand or salt on walking paths during winter.  Clean up any spills in your garage right away. This includes oil or grease spills. What can I do in the bathroom?  Use night lights.  Install grab bars by the toilet and in the tub and shower. Do not use towel bars as grab bars.  Use non-skid mats or decals in the tub or shower.  If you need to sit down in the shower, use a plastic, non-slip stool.  Keep the  floor dry. Clean up any water that spills on the floor as soon as it happens.  Remove soap buildup in the tub or shower regularly.  Attach bath mats securely with double-sided non-slip rug tape.  Do not have throw rugs and other things on the floor that can make you trip. What can I do in the bedroom?  Use night lights.  Make sure that you have a light by your bed that is easy to reach.  Do not use any sheets or blankets that are too big for your bed. They should not hang down onto the floor.  Have a firm chair that has side arms. You can use this for support while you get dressed.  Do not have throw rugs and other things on the floor that can make you trip. What can I do in the kitchen?  Clean up any spills right away.  Avoid walking on wet floors.  Keep items that you use a lot in easy-to-reach places.  If you need to reach something above you, use a strong step stool that has a grab bar.  Keep electrical cords out of the way.  Do not use floor polish or wax that makes floors slippery. If you must use wax, use non-skid floor wax.  Do not have throw rugs and  other things on the floor that can make you trip. What can I do with my stairs?  Do not leave any items on the stairs.  Make sure that there are handrails on both sides of the stairs and use them. Fix handrails that are broken or loose. Make sure that handrails are as long as the stairways.  Check any carpeting to make sure that it is firmly attached to the stairs. Fix any carpet that is loose or worn.  Avoid having throw rugs at the top or bottom of the stairs. If you do have throw rugs, attach them to the floor with carpet tape.  Make sure that you have a light switch at the top of the stairs and the bottom of the stairs. If you do not have them, ask someone to add them for you. What else can I do to help prevent falls?  Wear shoes that:  Do not have high heels.  Have rubber bottoms.  Are comfortable and fit  you well.  Are closed at the toe. Do not wear sandals.  If you use a stepladder:  Make sure that it is fully opened. Do not climb a closed stepladder.  Make sure that both sides of the stepladder are locked into place.  Ask someone to hold it for you, if possible.  Clearly mark and make sure that you can see:  Any grab bars or handrails.  First and last steps.  Where the edge of each step is.  Use tools that help you move around (mobility aids) if they are needed. These include:  Canes.  Walkers.  Scooters.  Crutches.  Turn on the lights when you go into a dark area. Replace any light bulbs as soon as they burn out.  Set up your furniture so you have a clear path. Avoid moving your furniture around.  If any of your floors are uneven, fix them.  If there are any pets around you, be aware of where they are.  Review your medicines with your doctor. Some medicines can make you feel dizzy. This can increase your chance of falling. Ask your doctor what other things that you can do to help prevent falls. This information is not intended to replace advice given to you by your health care provider. Make sure you discuss any questions you have with your health care provider. Document Released: 07/25/2009 Document Revised: 03/05/2016 Document Reviewed: 11/02/2014 Elsevier Interactive Patient Education  2017 [employer].

## 2020-04-01 NOTE — Progress Notes (Signed)
Subjective:   Jenny [name] is a 79 y.o. female who presents for an Initial Medicare Annual Wellness Visit.  I connected with Jenny [name] today by telephone and verified that I am speaking with the correct person using two identifiers. Location patient: home Location provider: work Persons participating in the virtual visit: patient, Jenny [name].    I discussed the limitations, risks, security and privacy concerns of performing an evaluation and management service by telephone and the availability of in person appointments. I also discussed with the patient that there may be a patient responsible charge related to this service. The patient expressed understanding and verbally consented to this telephonic visit.    Interactive audio and video telecommunications were attempted between this provider and patient, however failed, due to patient having technical difficulties OR patient did not have access to video capability.  We continued and completed visit with audio only.  Some vital signs may be absent or patient reported.   Time Spent with patient on telephone encounter: 30  minutes  Review of Systems      Cardiac Risk Factors include: advanced age (>47men, >17 women);hypertension;obesity (BMI >30kg/m2);sedentary lifestyle     Objective:    Today's Vitals   04/01/20 0856  Weight: 219 lb (99.3 kg)  Height: 5\' 5"  (1.651 m)   Body mass index is 36.44 kg/m.  Advanced Directives 04/01/2020 06/03/2017 08/10/2016 08/24/2011 08/19/2011 08/14/2011  Does Patient Have a Medical Advance Directive? Yes Yes Yes Patient has advance directive, copy not in chart Patient has advance directive, copy not in chart Patient has advance directive, copy not in chart  Type of Advance Directive [place];Living will Living will;Healthcare Power of [place];Living will Healthcare Power of [place];Living will [place];Living will  Copy of [place] in Chart? No - copy requested No - copy requested - Copy requested from family - Copy requested from family  Pre-existing out of facility DNR order (yellow form or pink MOST form) - - - No - No    Current Medications (verified) Outpatient Encounter Medications as of 04/01/2020  Medication Sig  . albuterol (VENTOLIN HFA) 108 (90 Base) MCG/ACT inhaler Inhale 2 puffs into the lungs every 6 (six) hours as needed. For wheezing  . ALPRAZolam (XANAX) 0.5 MG tablet TAKE 1/2 TO 1 TABLET BY MOUTH THREE TIMES DAILY AS NEEDED FOR ANXIETY  . amLODipine (NORVASC) 5 MG tablet TAKE 1 TABLET BY MOUTH EVERY DAY  . benzonatate (TESSALON) 200 MG capsule Take 1 capsule (200 mg total) by mouth 3 (three) times daily as needed for cough.  . budesonide-formoterol (SYMBICORT) 160-4.5 MCG/ACT inhaler Inhale 2 puffs into the lungs 2 (two) times daily.  Marland Kitchen escitalopram (LEXAPRO) 5 MG tablet TAKE 1 TABLET(5 MG) BY MOUTH DAILY  . fluticasone (FLONASE) 50 MCG/ACT nasal spray Place 1 spray into both nostrils daily.  . furosemide (LASIX) 20 MG tablet Take 20 mg by mouth. Take 2 tablet daily as needed  . guaifenesin (MUCUS RELIEF) 400 MG TABS tablet Take 400 mg by mouth 2 (two) times a day.  . ibuprofen (ADVIL,MOTRIN) 200 MG tablet Take 400 mg by mouth every 6 (six) hours as needed for moderate pain.  Marland Kitchen ipratropium-albuterol (DUONEB) 0.5-2.5 (3) MG/3ML SOLN USE 3 ML VIA NEBULIZER FOUR TIMES DAILY  . levothyroxine (SYNTHROID) 112 MCG tablet Take 1 tablet (112 mcg total) by mouth daily.  . pantoprazole (PROTONIX) 40 MG tablet TAKE 1 TABLET BY  MOUTH EVERY DAY 30 MINUTES BEFORE DINNER  . Respiratory Therapy Supplies (FLUTTER) DEVI 1 Device by Does not apply route in the morning, at noon, and at bedtime.  . Vitamin D, Ergocalciferol, (DRISDOL) 1.25 MG (50000 UNIT) CAPS capsule Take 1 capsule (50,000 Units total) by mouth every 7 (seven) days.  . [DISCONTINUED] esomeprazole  (NEXIUM) 40 MG capsule Take 40 mg by mouth daily before breakfast.    . [DISCONTINUED] methylPREDNISolone (MEDROL) 4 MG tablet 4 pills daily for 2 days, 3 pills daily for 2 days, 2 pills daily for 2 days, 1 pill daily for 2 days and stop .   No facility-administered encounter medications on file as of 04/01/2020.    Allergies (verified) Prednisone, Codeine, and Tramadol   History: Past Medical History:  Diagnosis Date  . ALLERGIC RHINITIS 08/23/2007  . Allergy    rhinitis  . 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 Anemia 09/21/2011  . Annual physical exam 08/06/2013  . Asthma    mild, intermittent  . Asthma 01/14/2011  . Atrophic vaginitis 06/17/2012  . BACK PAIN, LUMBAR 08/23/2007  . Bronchitis   . BRONCHITIS, RECURRENT 02/22/2008  . Cough    usually not productive  . Degenerative joint disease   . DEGENERATIVE JOINT DISEASE 02/22/2008  . Depression with anxiety 03/06/2008   Qualifier: Diagnosis of  By: Lenna Gilford MD, Deborra Medina   . Diverticulosis of colon   . DIVERTICULOSIS OF COLON 02/22/2008  . Dysthymia   . 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  . elevated glucose 08/21/2008  . GERD 08/23/2007  . GERD (gastroesophageal reflux disease)   . HAND PAIN 04/09/2010  . History of chicken pox 01/14/2011  . History of measles 01/14/2011  . Hyperglycemia 07/07/2015  . Hypertension   . HYPERTENSION 02/22/2008  . Hypothyroidism   . HYPOTHYROIDISM 08/23/2007  . Incontinence of urine   . Lumbar back pain   . Obesity   . OBESITY 08/23/2007  . Obstructive sleep apnea    does not use CPAP, lost weight  . Other urinary incontinence 03/12/2010  . Peripheral neuropathy 02/12/2011   possibly related to spondylolisthesis per pt  . PONV (postoperative nausea and vomiting)   . Sleep apnea   . SLEEP APNEA, OBSTRUCTIVE 08/23/2007  . Transient memory loss March 2006   after a stressful event she had no memory the rest of that day  . Urinary frequency   . Urinary incontinence   . UTI (lower urinary tract infection) 03/23/2012    . Venous insufficiency   . VENOUS INSUFFICIENCY 08/23/2007  . Vitamin D deficiency   . VITAMIN D DEFICIENCY 08/21/2008   Past Surgical History:  Procedure Laterality Date  . ABDOMINAL HYSTERECTOMY     took one ovary  . AP repair and sling for cystocele  8-04    Dr Gaynelle Arabian  . APPENDECTOMY    . carpal tunel  2008  . CHOLECYSTECTOMY    . ctr     bilateral  . EYE SURGERY    . left hand  2010   left thumb, joint removal, CTR  . NECK SURGERY     x 2 - discectomy, fusion  . SHOULDER ARTHROSCOPY DISTAL CLAVICLE EXCISION AND OPEN ROTATOR CUFF REPAIR     right  . TONSILLECTOMY AND ADENOIDECTOMY    . TUBAL LIGATION     Family History  Problem Relation Age of Onset  . COPD Mother   . Pneumonia Mother   . Other Mother        CHF  .  Hypertension Mother   . Arthritis Mother        septic knee after replacement  . Heart disease Mother        chf  . Dementia Father   . Arthritis Brother   . Asthma Daughter   . Colon polyps Daughter   . Stroke Maternal Grandmother   . Heart disease Maternal Grandmother   . Other Maternal Grandfather        CHF  . Heart disease Maternal Grandfather        chf  . Cancer Paternal Grandmother        stomach?  . Other Paternal Grandfather        problems with kidneys  . Kidney disease Paternal Grandfather   . Anxiety disorder Daughter   . Cancer Maternal Aunt        breast  . Cancer Maternal Aunt        liver  . Colon cancer Neg Hx    Social History   Socioeconomic History  . Marital status: Widowed    Spouse name: Not on file  . Number of children: Not on file  . Years of education: Not on file  . Highest education level: Not on file  Occupational History  . Occupation: Retired  Tobacco Use  . Smoking status: Former Smoker    Packs/day: 1.50    Years: 30.00    Pack years: 45.00    Types: Cigarettes    Quit date: 10/13/1991    Years since quitting: 28.4  . Smokeless tobacco: Never Used  Vaping Use  . Vaping Use: Never used   Substance and Sexual Activity  . Alcohol use: No    Alcohol/week: 0.0 standard drinks  . Drug use: No  . Sexual activity: Never  Other Topics Concern  . Not on file  Social History Narrative  . Not on file   Social Determinants of Health   Financial Resource Strain: Medium Risk  . Difficulty of Paying Living Expenses: Somewhat hard  Food Insecurity: No Food Insecurity  . Worried About Charity fundraiser in the Last Year: Never true  . Ran Out of Food in the Last Year: Never true  Transportation Needs: No Transportation Needs  . Lack of Transportation (Medical): No  . Lack of Transportation (Non-Medical): No  Physical Activity: Inactive  . Days of Exercise per Week: 0 days  . Minutes of Exercise per Session: 0 min  Stress: No Stress Concern Present  . Feeling of Stress : Not at all  Social Connections: Moderately Integrated  . Frequency of Communication with Friends and Family: More than three times a week  . Frequency of Social Gatherings with Friends and Family: More than three times a week  . Attends Religious Services: 1 to 4 times per year  . Active Member of Clubs or Organizations: Yes  . Attends Archivist Meetings: 1 to 4 times per year  . Marital Status: Widowed    Tobacco Counseling Counseling given: Not Answered   Clinical Intake:  Pre-visit preparation completed: Yes  Pain : No/denies pain     Nutritional Status: BMI > 30  Obese Nutritional Risks: None Diabetes: No  What is the last grade level you completed in school?: GED  Interpreter Needed?: No  Information entered by :: Caroleen Hamman LPN   Activities of Daily Living In your present state of health, do you have any difficulty performing the following activities: 04/01/2020 11/06/2019  Hearing? Y N  Comment mild  hearing loss -  Vision? Aggie Moats  Comment seeing [place] -  Difficulty concentrating or making decisions? Y N  Comment occasionally -  Walking or  climbing stairs? N N  Dressing or bathing? N N  Doing errands, shopping? N N  Preparing Food and eating ? N -  Using the Toilet? N -  In the past six months, have you accidently leaked urine? Y -  Comment wears a pad -  Do you have problems with loss of bowel control? N -  Managing your Medications? N -  Managing your Finances? N -  Housekeeping or managing your Housekeeping? N -  Some recent data might be hidden     Immunizations and Health Maintenance Immunization History  Administered Date(s) Administered  . Fluad Quad(high Dose 65+) 08/02/2019  . Influenza Split 08/20/2011, 09/30/2012  . Influenza Whole 08/13/2013  . Influenza, High Dose Seasonal PF 10/13/2014, 09/03/2015, 06/03/2017, 09/25/2018  . Influenza-Unspecified 07/11/2016  . Moderna SARS-COVID-2 Vaccination 11/17/2019, 12/24/2019  . Pneumococcal Conjugate-13 03/16/2014  . Pneumococcal Polysaccharide-23 06/23/2016  . Tdap 09/22/2011  . Zoster 03/16/2014   There are no preventive care reminders to display for this patient.  Patient Care Team: Midge Minium, MD as PCP - General (Family Medicine) Noralee Space, MD as Consulting Physician (Pulmonary Disease) Ladene Artist, MD as Consulting Physician (Gastroenterology) Garrel Ridgel, [place] as Consulting Physician (Podiatry) Dian Queen, MD as Consulting Physician (Obstetrics and Gynecology) [name], [facility] as Pharmacist (Pharmacist)  Indicate any recent Medical Services you may have received from other than [facility] providers in the past year (date may be approximate).     Assessment:   This is a routine wellness examination for [place].  Hearing/Vision screen  Hearing Screening   125Hz  250Hz  500Hz  1000Hz  2000Hz  3000Hz  4000Hz  6000Hz  8000Hz   Right ear:           Left ear:           Comments: Mild hearing loss  Vision Screening Comments: Has some difficulty seeing since cataract surgery Having laser procedure soon Sees [facility]  Dietary issues and exercise activities discussed: Current Exercise Habits: The patient does not participate in regular exercise at present, Exercise limited by: respiratory conditions(s)  Goals Addressed            This Visit's Progress   . <enter goal here>   On track    Maintain current health by staying as active as possible.        Depression Screen PHQ 2/9 Scores 04/01/2020 11/06/2019 10/28/2017 06/03/2017 11/09/2016 06/25/2015 02/15/2015  PHQ - 2 Score 0 0 0 4 0 2 0  PHQ- 9 Score - 0 2 8 0 6 -    Fall Risk Fall Risk  04/01/2020 11/06/2019 06/03/2017 11/09/2016 06/25/2015  Falls in the past year? 0 0 Yes No No  Comment - - ankle weak - -  Number falls in past yr: 0 0 1 - -  Injury with Fall? 0 0 No - -  Risk for fall due to : - - Impaired balance/gait - -  Follow up Falls prevention discussed Falls evaluation completed Falls prevention discussed - -   FALL RISK PREVENTION PERTAINING TO THE HOME:  Any stairs in or around the home? No     Home free of loose throw rugs in walkways, pet beds, electrical cords, etc? Yes  Adequate lighting in your home to reduce risk of falls? Yes   ASSISTIVE DEVICES  UTILIZED TO PREVENT FALLS:  Life alert? No  Use of a cane, walker or w/c? No  Grab bars in the bathroom? No  Shower chair or bench in shower? No  Elevated toilet seat or a handicapped toilet? No    DME ORDERS:  DME order needed?  No   TIMED UP AND GO:  Was the test performed? No . Virtual Visit   Cognitive Function: No cognitive impairment noted. Patient states she does word search puzzles & plays games on her ipad.     6CIT Screen 04/01/2020  What Year? 0 points  What month? 0 points  What time? 0 points  Count back from 20 0 points  Months in reverse 0 points  Repeat phrase 0 points  Total Score 0    Screening Tests Health Maintenance  Topic Date Due  . MAMMOGRAM  04/01/2021 (Originally 06/29/2018)  . INFLUENZA VACCINE  05/12/2020  . TETANUS/TDAP   09/21/2021  . DEXA SCAN  Completed  . COVID-19 Vaccine  Completed  . PNA vac Low Risk Adult  Completed    Qualifies for Shingles Vaccine? Yes  Zostavax completed 03/16/2014. Due for Shingrix. Education has been provided regarding the importance of this vaccine. Pt has been advised to call insurance company to determine out of pocket expense. Advised may also receive vaccine at local pharmacy or Health Dept. Verbalized acceptance and understanding.  Tdap:  Completed 03/16/2014  Flu Vaccine: Due 06/2020  Pneumococcal Vaccine:  Prevnar completed 03/16/2014 Pneumovax-23 completed 06/23/2016  Covid-19 Vaccine: Completed vaccines 12/24/2019  Cancer Screenings:  Colorectal Screening: Completed 08/24/2016. No longer required.   Mammogram: Completed 06/29/2017. Declined. No longer required.   Bone Density: Completed 06/29/2017. Results reflect NORMAL. Repeat every 2 years. Patient Declined  Lung Cancer Screening: (Low Dose CT Chest recommended if Age 48-80 years, 30 pack-year currently smoking OR have quit w/in 15years.) does not qualify.     Additional Screening:  Hepatitis C Screening: does not qualify.  Vision Screening: Recommended annual ophthalmology exams for early detection of glaucoma and other disorders of the eye. Is the patient up to date with their annual eye exam?  Yes  Who is the provider or what is the name of the office in which the pt attends annual eye exams? [facility]   Dental Screening: Recommended annual dental exams for proper oral hygiene  Community Resource Referral:  CRR required this visit?  No       Plan:  I have personally reviewed and addressed the Medicare Annual Wellness questionnaire and have noted the following in the patient's chart:  A. Medical and social history B. Use of alcohol, tobacco or illicit drugs  C. Current medications and supplements D. Functional ability and status E.  Nutritional status F.  Physical  activity G. Advance directives H. List of other physicians I.  Hospitalizations, surgeries, and ER visits in previous 12 months J.  [place] such as hearing and vision if needed, cognitive and depression L. Referrals and appointments   In addition, I have reviewed and discussed with patient certain preventive protocols, quality metrics, and best practice recommendations. A written personalized care plan for preventive services as well as general preventive health recommendations were provided to patient.  Due to this being a telephonic visit, the after visit summary with patients personalized plan was offered to patient via mail or my-chart.Patient would like to access on my-chart.  Signed,    Marta Antu, LPN   [id number]/60/4540  Nurse Health Advisor  Nurse Notes:  Patient states she has difficulty paying for her Symbicort. Message sent to pharmacist for follow up.

## 2020-04-02 ENCOUNTER — Ambulatory Visit: Payer: Medicare HMO | Admitting: Adult Health

## 2020-04-07 ENCOUNTER — Other Ambulatory Visit: Payer: Self-pay | Admitting: Pulmonary Disease

## 2020-04-08 NOTE — Telephone Encounter (Signed)
Checked pt's chart and saw that pt has had both of her covid vaccines. Called and spoke with pt letting her know that since she was vaccinated that she did not need to have a covid test. Stated to her that she will need to bring her vaccine card with her to PFT and she verbalized understanding. Nothing further needed.

## 2020-04-11 ENCOUNTER — Ambulatory Visit: Payer: Medicare HMO

## 2020-04-11 ENCOUNTER — Ambulatory Visit: Payer: Self-pay

## 2020-04-11 DIAGNOSIS — F418 Other specified anxiety disorders: Secondary | ICD-10-CM

## 2020-04-11 DIAGNOSIS — I10 Essential (primary) hypertension: Secondary | ICD-10-CM

## 2020-04-11 DIAGNOSIS — J449 Chronic obstructive pulmonary disease, unspecified: Secondary | ICD-10-CM

## 2020-04-11 NOTE — Patient Instructions (Addendum)
Please call me at [phone] (direct line) with any questions - thank you!  - [name], Clinical Pharmacist  Goals Addressed            This Visit's Progress   . PharmD Care Plan       CARE PLAN ENTRY  Current Barriers:  . Chronic Disease Management support, education, and care coordination needs related to HTN and hypothyroidism , GERD, depression.  Pharmacist Clinical Goal(s):  . BP <140/90 . TSH 0.35-4.5 . Receive Shingrix vaccine series over the next year  Interventions: . Comprehensive medication review performed. . Will refile for patient assistance with patient.  Patient Self Care Activities:  . Fill out your section of patient assistance application and drop off with Dr. Halford Chessman next appointment.  . Patient verbalizes understanding of plan to work with pharmacist on patient assistance. . Try keeping an eye on your home blood pressure, checking at least once every 1-2 weeks. Document and provide at future visits.  . Please call me with any questions at [phone].  Initial goal documentation.     The patient verbalized understanding of instructions provided today and agreed to receive a mailed copy of patient instruction and/or educational materials. Telephone follow up appointment with pharmacy team member scheduled for: See next appointment with "Care Management Staff" under "What's Next" below.   Thank you,  [name], Pharm.D., BCGP Clinical Pharmacist [facility] [phone] Chronic Obstructive Pulmonary Disease Chronic obstructive pulmonary disease (COPD) is a long-term (chronic) lung problem. When you have COPD, it is hard for air to get in and out of your lungs. Usually the condition gets worse over time, and your lungs will never return to normal. There are things you can do to keep yourself as healthy as possible.  Your doctor may treat your condition with: ? Medicines. ? Oxygen. ? Lung surgery.  Your doctor may also  recommend: ? Rehabilitation. This includes steps to make your body work better. It may involve a team of specialists. ? Quitting smoking, if you smoke. ? Exercise and changes to your diet. ? Comfort measures (palliative care). Follow these instructions at home: Medicines  Take over-the-counter and prescription medicines only as told by your doctor.  Talk to your doctor before taking any cough or allergy medicines. You may need to avoid medicines that cause your lungs to be dry. Lifestyle  If you smoke, stop. Smoking makes the problem worse. If you need help quitting, ask your doctor.  Avoid being around things that make your breathing worse. This may include smoke, chemicals, and fumes.  Stay active, but remember to rest as well.  Learn and use tips on how to relax.  Make sure you get enough sleep. Most adults need at least 7 hours of sleep every night.  Eat healthy foods. Eat smaller meals more often. Rest before meals. Controlled breathing Learn and use tips on how to control your breathing as told by your doctor. Try:  Breathing in (inhaling) through your nose for 1 second. Then, pucker your lips and breath out (exhale) through your lips for 2 seconds.  Putting one hand on your belly (abdomen). Breathe in slowly through your nose for 1 second. Your hand on your belly should move out. Pucker your lips and breathe out slowly through your lips. Your hand on your belly should move in as you breathe out.  Controlled coughing Learn and use controlled coughing to clear mucus from your lungs. Follow these steps: 1. Lean your head a little forward.  2. Breathe in deeply. 3. Try to hold your breath for 3 seconds. 4. Keep your mouth slightly open while coughing 2 times. 5. Spit any mucus out into a tissue. 6. Rest and do the steps again 1 or 2 times as needed. General instructions  Make sure you get all the shots (vaccines) that your doctor recommends. Ask your doctor about a flu shot  and a pneumonia shot.  Use oxygen therapy and pulmonary rehabilitation if told by your doctor. If you need home oxygen therapy, ask your doctor if you should buy a tool to measure your oxygen level (oximeter).  Make a COPD action plan with your doctor. This helps you to know what to do if you feel worse than usual.  Manage any other conditions you have as told by your doctor.  Avoid going outside when it is very hot, cold, or humid.  Avoid people who have a sickness you can catch (contagious).  Keep all follow-up visits as told by your doctor. This is important. Contact a doctor if:  You cough up more mucus than usual.  There is a change in the color or thickness of the mucus.  It is harder to breathe than usual.  Your breathing is faster than usual.  You have trouble sleeping.  You need to use your medicines more often than usual.  You have trouble doing your normal activities such as getting dressed or walking around the house. Get help right away if:  You have shortness of breath while resting.  You have shortness of breath that stops you from: ? Being able to talk. ? Doing normal activities.  Your chest hurts for longer than 5 minutes.  Your skin color is more blue than usual.  Your pulse oximeter shows that you have low oxygen for longer than 5 minutes.  You have a fever.  You feel too tired to breathe normally. Summary  Chronic obstructive pulmonary disease (COPD) is a long-term lung problem.  The way your lungs work will never return to normal. Usually the condition gets worse over time. There are things you can do to keep yourself as healthy as possible.  Take over-the-counter and prescription medicines only as told by your doctor.  If you smoke, stop. Smoking makes the problem worse. This information is not intended to replace advice given to you by your health care provider. Make sure you discuss any questions you have with your health care  provider. Document Revised: 09/10/2017 Document Reviewed: 11/02/2016 Elsevier Patient Education  2020 [employer].

## 2020-04-11 NOTE — Progress Notes (Signed)
Chronic Care Management Pharmacy  Name: Jenny [name]   MRN: [id number]  DOB: 03-Nov-1940  Chief Complaint/HPI Jenny [name],  79 y.o., female presents for their Initial CCM visit with the clinical pharmacist via telephone due to COVID-19 Pandemic.  PCP : Midge Minium, MD Their chronic conditions include: HTN, Hypothyroidism, Anxiety and Depression, GERD  Feels fluid in ankles is worse in summer months however is resolved upon taking furosemide. Denies dizziness, SOB, chest pain. Has not been checking blood pressure at home.   Shingrix vaccine previously discussed, has not received but is still interested and plans to f/u on this.   States her mood and energy has been great. Walking occasionally out in the garden but limited by SOB. Has upcoming PFT 04/17/2020, concerned that there may be asthmatic trigger causing worsening symptoms lately. Patient did not receive symbicort PAP application in mail previously so is needing additional application today.  Office Visits: 04/01/2020 [facility]): shingrix discussion  11/06/2019 (PCP): Seen for fatigue, levothyroxine inc. From 100 to 112 mcg Consult Visits: 12/08/2019 ([facility], [facility]): COPD exacerbation. Augmentin and Medrol prescribed.  10/26/2019: COPD exacerbation  Patient Active Problem List   Diagnosis Date Noted  . Cough 07/06/2018  . Peripheral edema 02/22/2017  . Localized edema 12/17/2016  . Colon cancer screening 06/15/2016  . COPD mixed type ([place]) 02/10/2016  . Other fatigue 11/03/2015  . Hyperglycemia 07/07/2015  . Ceruminosis 03/07/2015  . Left hip pain 04/26/2014  . Acute pain of left knee 04/26/2014  . Annual physical exam 08/06/2013  . Atrophic vaginitis 06/17/2012  . Peripheral vascular disease, unspecified ([place]) 06/02/2012  . Urticaria 10/15/2011  . Anemia 09/21/2011  . 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  . Vitamin D deficiency 02/12/2011  . Peripheral neuropathy 02/12/2011  . Intertrigo 01/21/2011   . Asthma 01/14/2011  . 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 History of chicken pox 01/14/2011  . History of measles 01/14/2011  . HAND PAIN 04/09/2010  . OTHER URINARY INCONTINENCE 03/12/2010  . Depression with anxiety 03/06/2008  . Essential hypertension 02/22/2008  . Chronic bronchitis ([place]) 02/22/2008  . DIVERTICULOSIS OF COLON 02/22/2008  . Unilateral primary osteoarthritis, left knee 02/22/2008  . Hypothyroidism 08/23/2007  . Obesity 08/23/2007  . Obstructive sleep apnea 08/23/2007  . Venous (peripheral) insufficiency 08/23/2007  . Allergic rhinitis 08/23/2007  . GERD 08/23/2007  . BACK PAIN, LUMBAR 08/23/2007   Past Surgical History:  Procedure Laterality Date  . ABDOMINAL HYSTERECTOMY     took one ovary  . AP repair and sling for cystocele  8-04    Dr Gaynelle Arabian  . APPENDECTOMY    . carpal tunel  2008  . CHOLECYSTECTOMY    . ctr     bilateral  . EYE SURGERY    . left hand  2010   left thumb, joint removal, CTR  . NECK SURGERY     x 2 - discectomy, fusion  . SHOULDER ARTHROSCOPY DISTAL CLAVICLE EXCISION AND OPEN ROTATOR CUFF REPAIR     right  . TONSILLECTOMY AND ADENOIDECTOMY    . TUBAL LIGATION     Social History   Socioeconomic History  . Marital status: Widowed    Spouse name: Not on file  . Number of children: Not on file  . Years of education: Not on file  . Highest education level: Not on file  Occupational History  . Occupation: Retired  Tobacco Use  . Smoking status: Former Smoker    Packs/day: 1.50    Years:  30.00    Pack years: 45.00    Types: Cigarettes    Quit date: 10/13/1991    Years since quitting: 28.5  . Smokeless tobacco: Never Used  Vaping Use  . Vaping Use: Never used  Substance and Sexual Activity  . Alcohol use: No    Alcohol/week: 0.0 standard drinks  . Drug use: No  . Sexual activity: Never  Other Topics Concern  . Not on file  Social History Narrative  . Not on file   Social Determinants of Health   Financial  Resource Strain: Medium Risk  . Difficulty of Paying Living Expenses: Somewhat hard  Food Insecurity: No Food Insecurity  . Worried About Charity fundraiser in the Last Year: Never true  . Ran Out of Food in the Last Year: Never true  Transportation Needs: No Transportation Needs  . Lack of Transportation (Medical): No  . Lack of Transportation (Non-Medical): No  Physical Activity: Inactive  . Days of Exercise per Week: 0 days  . Minutes of Exercise per Session: 0 min  Stress: No Stress Concern Present  . Feeling of Stress : Not at all  Social Connections: Moderately Integrated  . Frequency of Communication with Friends and Family: More than three times a week  . Frequency of Social Gatherings with Friends and Family: More than three times a week  . Attends Religious Services: 1 to 4 times per year  . Active Member of Clubs or Organizations: Yes  . Attends Archivist Meetings: 1 to 4 times per year  . Marital Status: Widowed   Family History  Problem Relation Age of Onset  . COPD Mother   . Pneumonia Mother   . Other Mother        CHF  . Hypertension Mother   . Arthritis Mother        septic knee after replacement  . Heart disease Mother        chf  . Dementia Father   . Arthritis Brother   . Asthma Daughter   . Colon polyps Daughter   . Stroke Maternal Grandmother   . Heart disease Maternal Grandmother   . Other Maternal Grandfather        CHF  . Heart disease Maternal Grandfather        chf  . Cancer Paternal Grandmother        stomach?  . Other Paternal Grandfather        problems with kidneys  . Kidney disease Paternal Grandfather   . Anxiety disorder Daughter   . Cancer Maternal Aunt        breast  . Cancer Maternal Aunt        liver  . Colon cancer Neg Hx    Allergies  Allergen Reactions  . Prednisone Other (See Comments)    Confusion, dizziness Medrol is ok  . Codeine Nausea Only       . Tramadol Itching   Outpatient Encounter  Medications as of 04/11/2020  Medication Sig Note  . albuterol (VENTOLIN HFA) 108 (90 Base) MCG/ACT inhaler Inhale 2 puffs into the lungs every 6 (six) hours as needed. For wheezing   . ALPRAZolam (XANAX) 0.5 MG tablet TAKE 1/2 TO 1 TABLET BY MOUTH THREE TIMES DAILY AS NEEDED FOR ANXIETY   . amLODipine (NORVASC) 5 MG tablet TAKE 1 TABLET BY MOUTH EVERY DAY   . benzonatate (TESSALON) 200 MG capsule Take 1 capsule (200 mg total) by mouth 3 (three) times daily as  needed for cough.   . budesonide-formoterol (SYMBICORT) 160-4.5 MCG/ACT inhaler Inhale 2 puffs into the lungs 2 (two) times daily.   Marland Kitchen escitalopram (LEXAPRO) 5 MG tablet TAKE 1 TABLET(5 MG) BY MOUTH DAILY   . fluticasone (FLONASE) 50 MCG/ACT nasal spray Place 1 spray into both nostrils daily.   . furosemide (LASIX) 20 MG tablet Take 20 mg by mouth. Take 2 tablet daily as needed   . guaifenesin (MUCUS RELIEF) 400 MG TABS tablet Take 400 mg by mouth 2 (two) times a day.   . ibuprofen (ADVIL,MOTRIN) 200 MG tablet Take 400 mg by mouth every 6 (six) hours as needed for moderate pain.   Marland Kitchen ipratropium-albuterol (DUONEB) 0.5-2.5 (3) MG/3ML SOLN USE 3 ML VIA NEBULIZER FOUR TIMES DAILY   . levothyroxine (SYNTHROID) 112 MCG tablet Take 1 tablet (112 mcg total) by mouth daily.   . pantoprazole (PROTONIX) 40 MG tablet TAKE 1 TABLET BY MOUTH EVERY DAY 30 MINUTES BEFORE DINNER   . Respiratory Therapy Supplies (FLUTTER) DEVI 1 Device by Does not apply route in the morning, at noon, and at bedtime.   . Vitamin D, Ergocalciferol, (DRISDOL) 1.25 MG (50000 UNIT) CAPS capsule Take 1 capsule (50,000 Units total) by mouth every 7 (seven) days.   . [DISCONTINUED] esomeprazole (NEXIUM) 40 MG capsule Take 40 mg by mouth daily before breakfast.   12/18/2011: Dr Marlyce Huge put pt on Zantac   No facility-administered encounter medications on file as of 04/11/2020.   Patient Care Team    Relationship Specialty Notifications Start End  Midge Minium, MD PCP - General  Family Medicine  11/09/16   Noralee Space, MD Consulting Physician Pulmonary Disease  11/09/16   Ladene Artist, MD Consulting Physician Gastroenterology  11/09/16   Garrel Ridgel, [place] Consulting Physician Podiatry  06/03/17   Dian Queen, MD Consulting Physician Obstetrics and Gynecology  06/03/17   [name], [facility] Pharmacist Pharmacist  01/09/20    Comment: phone number [phone]   Current Diagnosis/Assessment:  Goals Addressed            This Visit's Progress   . PharmD Care Plan       CARE PLAN ENTRY  Current Barriers:  . Chronic Disease Management support, education, and care coordination needs related to HTN and hypothyroidism , GERD, depression.  Pharmacist Clinical Goal(s):  . BP <140/90 . TSH 0.35-4.5 . Receive Shingrix vaccine series over the next year  Interventions: . Comprehensive medication review performed. . Will refile for patient assistance with patient.  Patient Self Care Activities:  . Fill out your section of patient assistance application and drop off with Dr. Halford Chessman next appointment.  . Patient verbalizes understanding of plan to work with pharmacist on patient assistance. . Try keeping an eye on your home blood pressure, checking at least once every 1-2 weeks. Document and provide at future visits.  . Please call me with any questions at [phone].  Initial goal documentation.      Hypertension / Edema    Goal <140/90  Office blood pressures are  BP Readings from Last 3 Encounters:  02/13/20 136/88  01/30/20 120/80  12/08/19 130/68   BP currently at goal of <140/90 per recent office visits. Currently taking:   Amlodipine 5 mg daily   Furosemide 20 mg two tablets daily as needed for swelling in ankles  We discussed: home monitoring of BP.   Plan  Continue current medications.   COPD   Several exacerbations since December. Now has started  using Duoneb again as 3 mL four times daily in addition to current maintenance regimen  of Symbicort 160-4.5 two puffs into the lungs two times daily. Also has ventolin inhaler, which is used as 2 puffs every 6 hours as needed.   Needing PAP application for Symbicort.   Plan  Continue current medications and file for patient assistance.  Provided with PAP application. Patient to drop off with Dr. Halford Chessman at upcoming visit.   Anxiety and Depression   PHQ 2/9:0. States ongoing benefit from medications. Denies any side effects.   Patient is currently controlled on the following medications: escitalopram 5mg  daily, alprazolam 0.5 mg take 1/2 tablet by mouth three times daily as needed for anxiety.   Plan  Continue current medications.  Hypothyroidism   TSH  Date Value Ref Range Status  12/05/2019 3.66 0.35 - 4.50 uIU/mL Final   11/2019 TSH in normal range after increase from 100 to 112 mcg. No symptoms or side effects reported.   Patient is currently controlled on the following medications:  . Levothyroxine 112 mcg daily   Plan Continue current medications.  GERD   Denies any recent symptoms of acid reflux. Remains on PPI to r/o worsening COPD symptoms due to reflux.   Patient is currently controlled on the following medications:  Protonix 40mg  daily  Plan Continue current medications.  Vaccines   Immunization History  Administered Date(s) Administered  . Fluad Quad(high Dose 65+) 08/02/2019  . Influenza Split 08/20/2011, 09/30/2012  . Influenza Whole 08/13/2013  . Influenza, High Dose Seasonal PF 10/13/2014, 09/03/2015, 06/03/2017, 09/25/2018  . Influenza-Unspecified 07/11/2016  . Moderna SARS-COVID-2 Vaccination 11/17/2019, 12/24/2019  . Pneumococcal Conjugate-13 03/16/2014  . Pneumococcal Polysaccharide-23 06/23/2016  . Tdap 09/22/2011  . Zoster 03/16/2014   Reinforced shingrix recommendation with patient. Will be due for tdap 09/2021  Plan  Recommended patient receive Shingrix vaccine in office/pharmacy.   Medication Management   Receives  prescription medications from:  [id number] - [place], [place] - [address] [place] [address] Phone: [phone] Fax: [place], [place] - [address] [place] [address] [place] [address] Phone: [phone] Fax: [phone]   Plan  Continue current medication management strategy.  Follow up: 3 month phone visit.  COPD, Depression, HTN __________  [name], Pharm.D., BCGP Clinical Pharmacist [facility] [phone]

## 2020-04-11 NOTE — Progress Notes (Signed)
PAP application filled out and sent to patient.  PAP for Symbicort - [place]. [employer], reports symbicort copay as cost prohibitive at this time. Pt agrees to fill out income section and sign/date. Will drop off with Dr. Halford Chessman to sign/date prescriber section at upcoming visit 04/17/2020. Will call pharmacist at [phone] with any questions.

## 2020-04-17 ENCOUNTER — Other Ambulatory Visit: Payer: Self-pay

## 2020-04-17 ENCOUNTER — Encounter: Payer: Self-pay | Admitting: Adult Health

## 2020-04-17 ENCOUNTER — Ambulatory Visit: Payer: Medicare HMO | Admitting: Adult Health

## 2020-04-17 ENCOUNTER — Ambulatory Visit (INDEPENDENT_AMBULATORY_CARE_PROVIDER_SITE_OTHER): Payer: Medicare HMO | Admitting: Pulmonary Disease

## 2020-04-17 VITALS — BP 118/72 | HR 82 | Temp 98.2°F | Ht 65.0 in | Wt 220.6 lb

## 2020-04-17 DIAGNOSIS — J8489 Other specified interstitial pulmonary diseases: Secondary | ICD-10-CM

## 2020-04-17 DIAGNOSIS — R9389 Abnormal findings on diagnostic imaging of other specified body structures: Secondary | ICD-10-CM | POA: Diagnosis not present

## 2020-04-17 DIAGNOSIS — J449 Chronic obstructive pulmonary disease, unspecified: Secondary | ICD-10-CM

## 2020-04-17 DIAGNOSIS — J309 Allergic rhinitis, unspecified: Secondary | ICD-10-CM

## 2020-04-17 LAB — PULMONARY FUNCTION TEST
FEF 25-75 Post: 1.06 L/sec
FEF 25-75 Pre: 0.85 L/sec
FEF2575-%Change-Post: 25 %
FEF2575-%Pred-Post: 75 %
FEF2575-%Pred-Pre: 59 %
FEV1-%Change-Post: 5 %
FEV1-%Pred-Post: 65 %
FEV1-%Pred-Pre: 61 %
FEV1-Post: 1.3 L
FEV1-Pre: 1.23 L
FEV1FVC-%Change-Post: 7 %
FEV1FVC-%Pred-Pre: 98 %
FEV6-%Change-Post: 0 %
FEV6-%Pred-Post: 65 %
FEV6-%Pred-Pre: 65 %
FEV6-Post: 1.66 L
FEV6-Pre: 1.68 L
FEV6FVC-%Change-Post: 1 %
FEV6FVC-%Pred-Post: 105 %
FEV6FVC-%Pred-Pre: 104 %
FVC-%Change-Post: -1 %
FVC-%Pred-Post: 61 %
FVC-%Pred-Pre: 62 %
FVC-Post: 1.66 L
FVC-Pre: 1.69 L
Post FEV1/FVC ratio: 79 %
Post FEV6/FVC ratio: 100 %
Pre FEV1/FVC ratio: 73 %
Pre FEV6/FVC Ratio: 99 %
RV % pred: 207 %
RV: 5.11 L
TLC % pred: 133 %
TLC: 6.97 L

## 2020-04-17 MED ORDER — METHYLPREDNISOLONE 4 MG PO TBPK: ORAL_TABLET | ORAL | 0 refills | Status: DC

## 2020-04-17 NOTE — Progress Notes (Signed)
@Patient  ID: Jenny [name], female    DOB: [birth year], 79 y.o.   MRN: [id number]  Chief Complaint  Patient presents with  . Follow-up    Referring provider: Midge Minium, MD  HPI: 79 year old female former smoker (quit 1993) followed for COPD with Asthma , allergic rhinitis and obstructive sleep apnea Medical history significant for hypertension, chronic back pain with previous back surgery  TEST/EVENTS :  Spirometry 04/21/18 >> FEV1 1.3 (64%), FEV1% 83  PSG April 2008 >> AHI 29, SpO2 low 62% Auto CPAP 11/07/19 to 12/05/18 >>used on 22 of 30 nights with average 8 hrs 40 min. Average AHI 1.2 with median CPAP 7 and 95 th percentile CPAP 11 cm H2O.  Echo 03/16/17 >> EF 60 to 65%  01/2020 IgE40 , Eosinophils absolute 600   01/2020 high-resolution CT chest that showed bronchiectasis or honeycombing.  There was mild patchy subpleural reticulation and groundglass opacity in both lungs with a mild basilar predominance.  04/17/2020 Follow up : COPD,Asthma,  OSA  Patient returns for a 3-month follow-up. Patient is prone to recurrent COPD exacerbations requiring frequent steroid doses. Last visit patient lab work showed elevated eosinophil count at 600. Patient is on Zyrtec and Flonase daily. She is on Symbicort twice daily. She continues to have ongoing recurrent cough wheezing and shortness of breath. Patient has difficulty affording medication she is previously tried Singulair and Advair with no perceived benefit. She is trying to get Symbicort through patient assistance. And she is taking DuoNeb nebulizer 3 times daily. A high-resolution CT chest was done April 2021 that showed no bronchiectasis or honeycombing. 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 Patient was recommended to begin pulmonary rehab for physical deconditioning however was not able to do this due to  her chronic back pain. Patient returns today and says that she is doing about the same continues to have intermittent wheezing and shortness of breath. Over the last week feels that her wheezing has gotten worse. Pulmonary function testing today was done that showed similar lung function with moderate restriction as in 2019 FEV1 65%, ratio 79, FVC 61%, no significant bronchodilator response., Positive mid flow obstruction with significant reversibility. DLCO was not completed due to patient's inability and ongoing cough.   Patient has underlying sleep apnea. She remains on CPAP at bedtime. Says she is doing very well. CPAP download was requested.    Allergies  Allergen Reactions  . Prednisone Other (See Comments)    Confusion, dizziness Medrol is ok  . Codeine Nausea Only       . Tramadol Itching    Immunization History  Administered Date(s) Administered  . Fluad Quad(high Dose 65+) 08/02/2019  . Influenza Split 08/20/2011, 09/30/2012  . Influenza Whole 08/13/2013  . Influenza, High Dose Seasonal PF 10/13/2014, 09/03/2015, 06/03/2017, 09/25/2018  . Influenza-Unspecified 07/11/2016  . Moderna SARS-COVID-2 Vaccination 11/17/2019, 12/24/2019  . Pneumococcal Conjugate-13 03/16/2014  . Pneumococcal Polysaccharide-23 06/23/2016  . Tdap 09/22/2011  . Zoster 03/16/2014    Past Medical History:  Diagnosis Date  . ALLERGIC RHINITIS 08/23/2007  . Allergy    rhinitis  . 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 Anemia 09/21/2011  . Annual physical exam 08/06/2013  . Asthma    mild, intermittent  . Asthma 01/14/2011  . Atrophic vaginitis 06/17/2012  . BACK PAIN, LUMBAR 08/23/2007  . Bronchitis   . BRONCHITIS, RECURRENT 02/22/2008  . Cough  usually not productive  . Degenerative joint disease   . DEGENERATIVE JOINT DISEASE 02/22/2008  . Depression with anxiety 03/06/2008   Qualifier: Diagnosis of  By: Lenna Gilford MD, Deborra Medina   . Diverticulosis of colon   . DIVERTICULOSIS OF COLON 02/22/2008    . Dysthymia   . 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  . elevated glucose 08/21/2008  . GERD 08/23/2007  . GERD (gastroesophageal reflux disease)   . HAND PAIN 04/09/2010  . History of chicken pox 01/14/2011  . History of measles 01/14/2011  . Hyperglycemia 07/07/2015  . Hypertension   . HYPERTENSION 02/22/2008  . Hypothyroidism   . HYPOTHYROIDISM 08/23/2007  . Incontinence of urine   . Lumbar back pain   . Obesity   . OBESITY 08/23/2007  . Obstructive sleep apnea    does not use CPAP, lost weight  . Other urinary incontinence 03/12/2010  . Peripheral neuropathy 02/12/2011   possibly related to spondylolisthesis per pt  . PONV (postoperative nausea and vomiting)   . Sleep apnea   . SLEEP APNEA, OBSTRUCTIVE 08/23/2007  . Transient memory loss March 2006   after a stressful event she had no memory the rest of that day  . Urinary frequency   . Urinary incontinence   . UTI (lower urinary tract infection) 03/23/2012  . Venous insufficiency   . VENOUS INSUFFICIENCY 08/23/2007  . Vitamin D deficiency   . VITAMIN D DEFICIENCY 08/21/2008    Tobacco History: Social History   Tobacco Use  Smoking Status Former Smoker  . Packs/day: 1.50  . Years: 30.00  . Pack years: 45.00  . Types: Cigarettes  . Quit date: 10/13/1991  . Years since quitting: 28.5  Smokeless Tobacco Never Used   Counseling given: Not Answered   Outpatient Medications Prior to Visit  Medication Sig Dispense Refill  . albuterol (VENTOLIN HFA) 108 (90 Base) MCG/ACT inhaler Inhale 2 puffs into the lungs every 6 (six) hours as needed. For wheezing 6.7 g 5  . ALPRAZolam (XANAX) 0.5 MG tablet TAKE 1/2 TO 1 TABLET BY MOUTH THREE TIMES DAILY AS NEEDED FOR ANXIETY 90 tablet 0  . amLODipine (NORVASC) 5 MG tablet TAKE 1 TABLET BY MOUTH EVERY DAY 90 tablet 0  . benzonatate (TESSALON) 200 MG capsule Take 1 capsule (200 mg total) by mouth 3 (three) times daily as needed for cough. 45 capsule 3  . budesonide-formoterol (SYMBICORT) 160-4.5 MCG/ACT  inhaler Inhale 2 puffs into the lungs 2 (two) times daily. 1 Inhaler 6  . escitalopram (LEXAPRO) 5 MG tablet TAKE 1 TABLET(5 MG) BY MOUTH DAILY 90 tablet 0  . fluticasone (FLONASE) 50 MCG/ACT nasal spray Place 1 spray into both nostrils daily. 16 g 2  . furosemide (LASIX) 20 MG tablet Take 20 mg by mouth. Take 2 tablet daily as needed    . guaifenesin (MUCUS RELIEF) 400 MG TABS tablet Take 400 mg by mouth 2 (two) times a day.    . ibuprofen (ADVIL,MOTRIN) 200 MG tablet Take 400 mg by mouth every 6 (six) hours as needed for moderate pain.    Marland Kitchen ipratropium-albuterol (DUONEB) 0.5-2.5 (3) MG/3ML SOLN USE 3 ML VIA NEBULIZER FOUR TIMES DAILY 360 mL 11  . levothyroxine (SYNTHROID) 112 MCG tablet Take 1 tablet (112 mcg total) by mouth daily. 90 tablet 1  . pantoprazole (PROTONIX) 40 MG tablet TAKE 1 TABLET BY MOUTH EVERY DAY 30 MINUTES BEFORE DINNER 90 tablet 1  . Respiratory Therapy Supplies (FLUTTER) DEVI 1 Device by Does not apply route in the morning,  at noon, and at bedtime. 1 each 0  . Vitamin D, Ergocalciferol, (DRISDOL) 1.25 MG (50000 UNIT) CAPS capsule Take 1 capsule (50,000 Units total) by mouth every 7 (seven) days. 4 capsule 3   No facility-administered medications prior to visit.     Review of Systems:   Constitutional:   No  weight loss, night sweats,  Fevers, chills, + fatigue, or  lassitude.  HEENT:   No headaches,  Difficulty swallowing,  Tooth/dental problems, or  Sore throat,                No sneezing, itching, ear ache, + nasal congestion, post nasal drip,   CV:  No chest pain,  Orthopnea, PND, swelling in lower extremities, anasarca, dizziness, palpitations, syncope.   GI  No heartburn, indigestion, abdominal pain, nausea, vomiting, diarrhea, change in bowel habits, loss of appetite, bloody stools.   Resp:    No chest wall deformity  Skin: no rash or lesions.  GU: no dysuria, change in color of urine, no urgency or frequency.  No flank pain, no hematuria   MS:  No  joint pain or swelling.  No decreased range of motion.  + back pain.    Physical Exam  BP 118/72 (BP Location: Left Arm, Cuff Size: Large)   Pulse 82   Temp 98.2 F (36.8 C) (Oral)   Ht 5\' 5"  (1.651 m)   Wt 220 lb 9.6 oz (100.1 kg)   SpO2 96%   BMI 36.71 kg/m   GEN: A/Ox3; pleasant , NAD, BMI 36   HEENT:  [place]/AT,   NOSE-clear, THROAT-clear, no lesions, no postnasal drip or exudate noted.   NECK:  Supple w/ fair ROM; no JVD; normal carotid impulses w/o bruits; no thyromegaly or nodules palpated; no lymphadenopathy.    RESP  Clear  P & A; w/o, wheezes/ rales/ or rhonchi. no accessory muscle use, no dullness to percussion  CARD:  RRR, no m/r/g, tr 1+ peripheral edema, pulses intact, no cyanosis or clubbing.  GI:   Soft & nt; nml bowel sounds; no organomegaly or masses detected.   Musco: Warm bil, no deformities or joint swelling noted.   Neuro: alert, no focal deficits noted.    Skin: Warm, no lesions or rashes    Lab Results:    BNP No results found for: BNP  ProBNP  Imaging: No results found.    PFT Results Latest Ref Rng & Units 04/17/2020  FVC-Pre L 1.69  FVC-Predicted Pre % 62  FVC-Post L 1.66  FVC-Predicted Post % 61  Pre FEV1/FVC % % 73  Post FEV1/FCV % % 79  FEV1-Pre L 1.23  FEV1-Predicted Pre % 61  FEV1-Post L 1.30  TLC L 6.97  TLC % Predicted % 133  RV % Predicted % 207    No results found for: NITRICOXIDE      Assessment & Plan:   No problem-specific Assessment & Plan notes found for this encounter.     Rexene Edison, NP 04/17/2020

## 2020-04-17 NOTE — Progress Notes (Signed)
PFT preformed today

## 2020-04-17 NOTE — Patient Instructions (Addendum)
Medrol dose pack.  Begin Dupixent process.  Labs today .  Mucinex Twice daily  As needed  Cough/congestion  Delsym 2 tsp Twice daily  For cough As needed   Tessalon Three times a day  As needed  Cough .  Zyrtec 10mg  daily  Flonase daily .  Continue on Symbicort 2 puffs Twice daily  -rinse after use.  Duoneb Three times a day  . Continue on CPAP At bedtime  .  Work on healthy weight loss. Do not drive if sleepy .   Follow up with Dr. Halford Chessman  In 2 months and As needed   Please contact office for sooner follow up if symptoms do not improve or worsen or seek emergency care

## 2020-04-18 DIAGNOSIS — R9389 Abnormal findings on diagnostic imaging of other specified body structures: Secondary | ICD-10-CM | POA: Insufficient documentation

## 2020-04-18 LAB — ANA: Anti Nuclear Antibody (ANA): NEGATIVE

## 2020-04-18 LAB — RHEUMATOID FACTOR: Rheumatoid fact SerPl-aCnc: 25 IU/mL — ABNORMAL HIGH (ref <14)

## 2020-04-18 NOTE — Assessment & Plan Note (Signed)
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 Patient does have restrictive changes on PFTs unfortunately was unable to complete DLCO portion. We will check autoimmune/CTD labs. Continue on current treatment with COPD/asthma. Continue to monitor closely.

## 2020-04-18 NOTE — Assessment & Plan Note (Signed)
COPD with an asthma component  She has an allergic component with elevated eosinophils, prone to recurrent exacerbations and frequent steroid tapers. For now we will continue on Symbicort, DuoNeb, Zyrtec and Flonase. We will begin [place] paperwork process. PFTs today show stable restrictive lung disease, patient has significant mid flow reversibility consistent with an asthmatic component  Plan  Patient Instructions  Medrol dose pack.  Begin Dupixent process.  Labs today .  Mucinex Twice daily  As needed  Cough/congestion  Delsym 2 tsp Twice daily  For cough As needed   Tessalon Three times a day  As needed  Cough .  Zyrtec 10mg  daily  Flonase daily .  Continue on Symbicort 2 puffs Twice daily  -rinse after use.  Duoneb Three times a day  . Continue on CPAP At bedtime  .  Work on healthy weight loss. Do not drive if sleepy .   Follow up with Dr. Halford Chessman  In 2 months and As needed   Please contact office for sooner follow up if symptoms do not improve or worsen or seek emergency care

## 2020-04-18 NOTE — Assessment & Plan Note (Signed)
Continue on current regimen .   

## 2020-04-19 DIAGNOSIS — H43392 Other vitreous opacities, left eye: Secondary | ICD-10-CM | POA: Diagnosis not present

## 2020-04-19 DIAGNOSIS — H18413 Arcus senilis, bilateral: Secondary | ICD-10-CM | POA: Diagnosis not present

## 2020-04-19 DIAGNOSIS — Z961 Presence of intraocular lens: Secondary | ICD-10-CM | POA: Diagnosis not present

## 2020-04-19 DIAGNOSIS — H26493 Other secondary cataract, bilateral: Secondary | ICD-10-CM | POA: Diagnosis not present

## 2020-04-22 LAB — HYPERSENSITIVITY PNEUMONITIS
A. Pullulans Abs: NEGATIVE
A.Fumigatus #1 Abs: NEGATIVE
Micropolyspora faeni, IgG: NEGATIVE
Pigeon Serum Abs: NEGATIVE
Thermoact. Saccharii: NEGATIVE
Thermoactinomyces vulgaris, IgG: NEGATIVE

## 2020-04-22 LAB — CYCLIC CITRUL PEPTIDE ANTIBODY, IGG/IGA: Cyclic Citrullin Peptide Ab: 7 units (ref 0–19)

## 2020-04-23 ENCOUNTER — Telehealth: Payer: Self-pay | Admitting: Pharmacy Technician

## 2020-04-23 NOTE — Telephone Encounter (Signed)
Received notification from [facility] regarding a prior authorization for [place]. Authorization has been APPROVED from 10/13/19 to 10/11/20.   Authorization # [id number]  Ran test claim, patient's copay for 1 month supply is $250.00. Faxed Patient assistance forms to [place]. Will update once we receive a response.

## 2020-04-23 NOTE — Telephone Encounter (Signed)
Received New start paperwork for DUPIXENT. Will update as we work through the benefits process. 

## 2020-04-23 NOTE — Telephone Encounter (Signed)
Submitted a Prior Authorization request to [employer] for [place] via Cover My Meds. Will update once we receive a response.   (KeyJeni Salles) - [id number]

## 2020-04-25 ENCOUNTER — Other Ambulatory Visit: Payer: Self-pay | Admitting: Family Medicine

## 2020-04-25 NOTE — Telephone Encounter (Signed)
Called patient and discussed benefits investigation results. Advised that Jenny [name] My way will be reaching out to her with insurance results and to initiate patient assistance, she must voice to rep that she can not afford $250 copay. Patient voiced understanding and will reach out to office with any questions.

## 2020-04-25 NOTE — Telephone Encounter (Signed)
Last OV 11/06/19 Alprazolam last filled 03/13/20 #90 with 0

## 2020-04-26 ENCOUNTER — Other Ambulatory Visit: Payer: Self-pay

## 2020-04-26 ENCOUNTER — Encounter: Payer: Self-pay | Admitting: Family Medicine

## 2020-04-26 ENCOUNTER — Telehealth (INDEPENDENT_AMBULATORY_CARE_PROVIDER_SITE_OTHER): Payer: Medicare HMO | Admitting: Family Medicine

## 2020-04-26 DIAGNOSIS — R197 Diarrhea, unspecified: Secondary | ICD-10-CM | POA: Diagnosis not present

## 2020-04-26 NOTE — Progress Notes (Signed)
I have discussed the procedure for the virtual visit with the patient who has given consent to proceed with assessment and treatment.   Pt unable to obtain vitals.   Martine Bleecker L Rorey Bisson, CMA     

## 2020-04-26 NOTE — Progress Notes (Signed)
Virtual Visit via Video   I connected with patient on 04/26/20 at  4:00 PM EDT by a video enabled telemedicine application and verified that I am speaking with the correct person using two identifiers.  Location patient: Home Location provider: Acupuncturist, Office Persons participating in the virtual visit: Patient, Provider, [place] (Jess B)  I discussed the limitations of evaluation and management by telemedicine and the availability of in person appointments. The patient expressed understanding and agreed to proceed.  Subjective:   HPI:   Nausea/diarrhea- Monday ate a hot dog and had dry heaves.  Developed diarrhea on Tuesday, 'I was so sick'.  Yesterday did not have diarrhea until last night.  Has not had diarrhea today.  Drinking Gatorade and water.  Lost 9 lbs.  Starting to regain her appetite.  No fever.  No abd pain.  No nausea since Monday.  Last night made rice.  This AM had rice and toast.  No blood in diarrhea.  No known sick contacts.  Has taken 6 OTC diarrhea pills this week w/ some relief.  'I think i'm just about better'   ROS:   See pertinent positives and negatives per HPI.  Patient Active Problem List   Diagnosis Date Noted  . Abnormal CT of the chest 04/18/2020  . Cough 07/06/2018  . Peripheral edema 02/22/2017  . Localized edema 12/17/2016  . Colon cancer screening 06/15/2016  . 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 Other fatigue 11/03/2015  . Hyperglycemia 07/07/2015  . Ceruminosis 03/07/2015  . Left hip pain 04/26/2014  . Acute pain of left knee 04/26/2014  . Annual physical exam 08/06/2013  . Atrophic vaginitis 06/17/2012  . Peripheral vascular disease, unspecified ([name]) 06/02/2012  . Urticaria 10/15/2011  . Anemia 09/21/2011  . 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  . Vitamin D deficiency 02/12/2011  . Peripheral neuropathy 02/12/2011  . Intertrigo 01/21/2011  . Asthma 01/14/2011  . 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 History of  chicken pox 01/14/2011  . History of measles 01/14/2011  . HAND PAIN 04/09/2010  . OTHER URINARY INCONTINENCE 03/12/2010  . Depression with anxiety 03/06/2008  . Essential hypertension 02/22/2008  . Chronic bronchitis ([place]) 02/22/2008  . DIVERTICULOSIS OF COLON 02/22/2008  . Unilateral primary osteoarthritis, left knee 02/22/2008  . Hypothyroidism 08/23/2007  . Obesity 08/23/2007  . Obstructive sleep apnea 08/23/2007  . Venous (peripheral) insufficiency 08/23/2007  . Allergic rhinitis 08/23/2007  . GERD 08/23/2007  . BACK PAIN, LUMBAR 08/23/2007    Social History   Tobacco Use  . Smoking status: Former Smoker    Packs/day: 1.50    Years: 30.00    Pack years: 45.00    Types: Cigarettes    Quit date: 10/13/1991    Years since quitting: 28.5  . Smokeless tobacco: Never Used  Substance Use Topics  . Alcohol use: No    Alcohol/week: 0.0 standard drinks    Current Outpatient Medications:  .  albuterol (VENTOLIN HFA) 108 (90 Base) MCG/ACT inhaler, Inhale 2 puffs into the lungs every 6 (six) hours as needed. For wheezing, Disp: 6.7 g, Rfl: 5 .  ALPRAZolam (XANAX) 0.5 MG tablet, TAKE 1/2 TO 1 TABLET BY MOUTH THREE TIMES DAILY AS NEEDED FOR ANXIETY, Disp: 90 tablet, Rfl: 0 .  amLODipine (NORVASC) 5 MG tablet, TAKE 1 TABLET BY MOUTH EVERY DAY, Disp: 90 tablet, Rfl: 0 .  benzonatate (TESSALON) 200 MG capsule, Take 1 capsule (200 mg total) by mouth 3 (three) times daily as needed  for cough., Disp: 45 capsule, Rfl: 3 .  budesonide-formoterol (SYMBICORT) 160-4.5 MCG/ACT inhaler, Inhale 2 puffs into the lungs 2 (two) times daily., Disp: 1 Inhaler, Rfl: 6 .  escitalopram (LEXAPRO) 5 MG tablet, TAKE 1 TABLET(5 MG) BY MOUTH DAILY, Disp: 90 tablet, Rfl: 0 .  fluticasone (FLONASE) 50 MCG/ACT nasal spray, Place 1 spray into both nostrils daily., Disp: 16 g, Rfl: 2 .  furosemide (LASIX) 20 MG tablet, Take 20 mg by mouth. Take 2 tablet daily as needed, Disp: , Rfl:  .  guaifenesin (MUCUS RELIEF) 400  MG TABS tablet, Take 400 mg by mouth 2 (two) times a day., Disp: , Rfl:  .  ibuprofen (ADVIL,MOTRIN) 200 MG tablet, Take 400 mg by mouth every 6 (six) hours as needed for moderate pain., Disp: , Rfl:  .  ipratropium-albuterol (DUONEB) 0.5-2.5 (3) MG/3ML SOLN, USE 3 ML VIA NEBULIZER FOUR TIMES DAILY, Disp: 360 mL, Rfl: 11 .  levothyroxine (SYNTHROID) 112 MCG tablet, Take 1 tablet (112 mcg total) by mouth daily., Disp: 90 tablet, Rfl: 1 .  methylPREDNISolone (MEDROL DOSEPAK) 4 MG TBPK tablet, Finish dose pack as directed., Disp: 1 each, Rfl: 0 .  pantoprazole (PROTONIX) 40 MG tablet, TAKE 1 TABLET BY MOUTH EVERY DAY 30 MINUTES BEFORE DINNER, Disp: 90 tablet, Rfl: 1 .  Respiratory Therapy Supplies (FLUTTER) DEVI, 1 Device by Does not apply route in the morning, at noon, and at bedtime., Disp: 1 each, Rfl: 0 .  Vitamin D, Ergocalciferol, (DRISDOL) 1.25 MG (50000 UNIT) CAPS capsule, Take 1 capsule (50,000 Units total) by mouth every 7 (seven) days., Disp: 4 capsule, Rfl: 3  Allergies  Allergen Reactions  . Prednisone Other (See Comments)    Confusion, dizziness Medrol is ok  . Codeine Nausea Only       . Tramadol Itching    Objective:   There were no vitals taken for this visit.  AAOx3, NAD NCAT, EOMI No obvious CN deficits Coloring WNL Pt is able to speak clearly, coherently without shortness of breath or increased work of breathing.  Thought process is linear.  Mood is appropriate.   Assessment and Plan:   Diarrhea- resolving.  Pt has not had diarrhea since last night and was able to eat something this morning.  Denies abd pain, fever, blood, N/V.  She reports feeling better and is aware she can take Imodium as needed.  Reviewed supportive care and red flags that should prompt return.  Pt expressed understanding and is in agreement w/ plan.    [name], MD 04/26/2020

## 2020-04-28 ENCOUNTER — Encounter (HOSPITAL_BASED_OUTPATIENT_CLINIC_OR_DEPARTMENT_OTHER): Payer: Self-pay | Admitting: Emergency Medicine

## 2020-04-28 ENCOUNTER — Other Ambulatory Visit: Payer: Self-pay

## 2020-04-28 ENCOUNTER — Emergency Department (HOSPITAL_BASED_OUTPATIENT_CLINIC_OR_DEPARTMENT_OTHER)
Admission: EM | Admit: 2020-04-28 | Discharge: 2020-04-28 | Disposition: A | Discharge status: Home / Self Care | Payer: Medicare HMO | Attending: Emergency Medicine | Admitting: Emergency Medicine

## 2020-04-28 DIAGNOSIS — I10 Essential (primary) hypertension: Secondary | ICD-10-CM | POA: Insufficient documentation

## 2020-04-28 DIAGNOSIS — Z79899 Other long term (current) drug therapy: Secondary | ICD-10-CM | POA: Diagnosis not present

## 2020-04-28 DIAGNOSIS — J45909 Unspecified asthma, uncomplicated: Secondary | ICD-10-CM | POA: Insufficient documentation

## 2020-04-28 DIAGNOSIS — R197 Diarrhea, unspecified: Secondary | ICD-10-CM | POA: Insufficient documentation

## 2020-04-28 DIAGNOSIS — E039 Hypothyroidism, unspecified: Secondary | ICD-10-CM | POA: Diagnosis not present

## 2020-04-28 DIAGNOSIS — Z87891 Personal history of nicotine dependence: Secondary | ICD-10-CM | POA: Diagnosis not present

## 2020-04-28 DIAGNOSIS — C189 Malignant neoplasm of colon, unspecified: Secondary | ICD-10-CM | POA: Diagnosis not present

## 2020-04-28 DIAGNOSIS — Z7951 Long term (current) use of inhaled steroids: Secondary | ICD-10-CM | POA: Diagnosis not present

## 2020-04-28 LAB — COMPREHENSIVE METABOLIC PANEL
ALT: 23 U/L (ref 0–44)
AST: 31 U/L (ref 15–41)
Albumin: 3.2 g/dL — ABNORMAL LOW (ref 3.5–5.0)
Alkaline Phosphatase: 63 U/L (ref 38–126)
Anion gap: 6 (ref 5–15)
BUN: 8 mg/dL (ref 8–23)
CO2: 26 mmol/L (ref 22–32)
Calcium: 8.1 mg/dL — ABNORMAL LOW (ref 8.9–10.3)
Chloride: 108 mmol/L (ref 98–111)
Creatinine, Ser: 0.97 mg/dL (ref 0.44–1.00)
GFR calc Af Amer: >60 mL/min (ref >60)
GFR calc non Af Amer: 55 mL/min — ABNORMAL LOW (ref >60)
Glucose, Bld: 113 mg/dL — ABNORMAL HIGH (ref 70–99)
Potassium: 3.9 mmol/L (ref 3.5–5.1)
Sodium: 140 mmol/L (ref 135–145)
Total Bilirubin: 0.5 mg/dL (ref 0.3–1.2)
Total Protein: 5.8 g/dL — ABNORMAL LOW (ref 6.5–8.1)

## 2020-04-28 LAB — CBC WITH DIFFERENTIAL/PLATELET
Abs Immature Granulocytes: 0.04 10*3/uL (ref 0.00–0.07)
Basophils Absolute: 0.1 10*3/uL (ref 0.0–0.1)
Basophils Relative: 1 %
Eosinophils Absolute: 0.5 10*3/uL (ref 0.0–0.5)
Eosinophils Relative: 6 %
HCT: 46.4 % — ABNORMAL HIGH (ref 36.0–46.0)
Hemoglobin: 14.9 g/dL (ref 12.0–15.0)
Immature Granulocytes: 0 %
Lymphocytes Relative: 24 %
Lymphs Abs: 2.2 10*3/uL (ref 0.7–4.0)
MCH: 29.7 pg (ref 26.0–34.0)
MCHC: 32.1 g/dL (ref 30.0–36.0)
MCV: 92.4 fL (ref 80.0–100.0)
Monocytes Absolute: 0.6 10*3/uL (ref 0.1–1.0)
Monocytes Relative: 7 %
Neutro Abs: 5.8 10*3/uL (ref 1.7–7.7)
Neutrophils Relative %: 62 %
Platelets: 279 10*3/uL (ref 150–400)
RBC: 5.02 MIL/uL (ref 3.87–5.11)
RDW: 13.3 % (ref 11.5–15.5)
WBC: 9.2 10*3/uL (ref 4.0–10.5)
nRBC: 0 % (ref 0.0–0.2)

## 2020-04-28 LAB — LIPASE, BLOOD: Lipase: 17 U/L (ref 11–51)

## 2020-04-28 MED ORDER — ONDANSETRON HCL 4 MG/2ML IJ SOLN
4.0000 mg | Freq: Once | INTRAMUSCULAR | Status: AC
  Administered 2020-04-28: 4 mg via INTRAVENOUS
  Filled 2020-04-28: qty 2

## 2020-04-28 MED ORDER — SODIUM CHLORIDE 0.9 % IV BOLUS
1000.0000 mL | Freq: Once | INTRAVENOUS | Status: AC
  Administered 2020-04-28: 1000 mL via INTRAVENOUS

## 2020-04-28 NOTE — ED Notes (Signed)
Monday began having nausea, Tuesday began having diarrhea, states called her primary MD on Friday, pt adjusted diet, pt cont to not feel well, so presents to the ED with c/o "feeling bad", denies having active vomiting, pt states she can tolerate PO fluids but has a lot of dirrhea

## 2020-04-28 NOTE — ED Notes (Signed)
AVS reviewed with client, reinforced information and instructions from the EDP, discussed diet and hydration again. Opportunity for questions provided, AVS given to pt

## 2020-04-28 NOTE — Discharge Instructions (Addendum)
Continue hydration at home.  Lab work today is normal.

## 2020-04-28 NOTE — ED Triage Notes (Signed)
Diarrhea and nausea x 4 days.

## 2020-04-28 NOTE — ED Provider Notes (Signed)
[facility]Y DEPARTMENT Provider Note   CSN: [id number] Arrival date & time: 04/28/20  0854     History Chief Complaint  Patient presents with  . Diarrhea    Jenny [name] is a 79 y.o. female.  The history is provided by the patient.  Diarrhea Severity:  Mild Onset quality:  Gradual Duration:  4 days Timing:  Intermittent Progression:  Unchanged Relieved by:  Nothing Worsened by:  Nothing Associated symptoms: no abdominal pain, no arthralgias, no chills, no recent cough, no diaphoresis, no fever, no headaches, no URI and no vomiting   Risk factors: no recent antibiotic use, no sick contacts and no suspicious food intake        Past Medical History:  Diagnosis Date  . ALLERGIC RHINITIS 08/23/2007  . Allergy    rhinitis  . 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 Anemia 09/21/2011  . Annual physical exam 08/06/2013  . Asthma    mild, intermittent  . Asthma 01/14/2011  . Atrophic vaginitis 06/17/2012  . BACK PAIN, LUMBAR 08/23/2007  . Bronchitis   . BRONCHITIS, RECURRENT 02/22/2008  . Cough    usually not productive  . Degenerative joint disease   . DEGENERATIVE JOINT DISEASE 02/22/2008  . Depression with anxiety 03/06/2008   Qualifier: Diagnosis of  By: Lenna Gilford MD, Deborra Medina   . Diverticulosis of colon   . DIVERTICULOSIS OF COLON 02/22/2008  . Dysthymia   . 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  . elevated glucose 08/21/2008  . GERD 08/23/2007  . GERD (gastroesophageal reflux disease)   . HAND PAIN 04/09/2010  . History of chicken pox 01/14/2011  . History of measles 01/14/2011  . Hyperglycemia 07/07/2015  . Hypertension   . HYPERTENSION 02/22/2008  . Hypothyroidism   . HYPOTHYROIDISM 08/23/2007  . Incontinence of urine   . Lumbar back pain   . Obesity   . OBESITY 08/23/2007  . Obstructive sleep apnea    does not use CPAP, lost weight  . Other urinary incontinence 03/12/2010  . Peripheral neuropathy 02/12/2011   possibly related to spondylolisthesis per pt  . PONV  (postoperative nausea and vomiting)   . Sleep apnea   . SLEEP APNEA, OBSTRUCTIVE 08/23/2007  . Transient memory loss March 2006   after a stressful event she had no memory the rest of that day  . Urinary frequency   . Urinary incontinence   . UTI (lower urinary tract infection) 03/23/2012  . Venous insufficiency   . VENOUS INSUFFICIENCY 08/23/2007  . Vitamin D deficiency   . VITAMIN D DEFICIENCY 08/21/2008    Patient Active Problem List   Diagnosis Date Noted  . Abnormal CT of the chest 04/18/2020  . Cough 07/06/2018  . Peripheral edema 02/22/2017  . Localized edema 12/17/2016  . Colon cancer screening 06/15/2016  . 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 Other fatigue 11/03/2015  . Hyperglycemia 07/07/2015  . Ceruminosis 03/07/2015  . Left hip pain 04/26/2014  . Acute pain of left knee 04/26/2014  . Annual physical exam 08/06/2013  . Atrophic vaginitis 06/17/2012  . Peripheral vascular disease, unspecified ([place]) 06/02/2012  . Urticaria 10/15/2011  . Anemia 09/21/2011  . 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  . Vitamin D deficiency 02/12/2011  . Peripheral neuropathy 02/12/2011  . Intertrigo 01/21/2011  . Asthma 01/14/2011  . 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 History of chicken pox 01/14/2011  . History of measles 01/14/2011  . HAND PAIN 04/09/2010  . OTHER URINARY INCONTINENCE 03/12/2010  . Depression  with anxiety 03/06/2008  . Essential hypertension 02/22/2008  . Chronic bronchitis ([place]) 02/22/2008  . DIVERTICULOSIS OF COLON 02/22/2008  . Unilateral primary osteoarthritis, left knee 02/22/2008  . Hypothyroidism 08/23/2007  . Obesity 08/23/2007  . Obstructive sleep apnea 08/23/2007  . Venous (peripheral) insufficiency 08/23/2007  . Allergic rhinitis 08/23/2007  . GERD 08/23/2007  . BACK PAIN, LUMBAR 08/23/2007    Past Surgical History:  Procedure Laterality Date  . ABDOMINAL HYSTERECTOMY     took one ovary  . AP repair and sling for cystocele  8-04     Dr Gaynelle Arabian  . APPENDECTOMY    . carpal tunel  2008  . CHOLECYSTECTOMY    . ctr     bilateral  . EYE SURGERY    . left hand  2010   left thumb, joint removal, CTR  . NECK SURGERY     x 2 - discectomy, fusion  . SHOULDER ARTHROSCOPY DISTAL CLAVICLE EXCISION AND OPEN ROTATOR CUFF REPAIR     right  . TONSILLECTOMY AND ADENOIDECTOMY    . TUBAL LIGATION       OB History   No obstetric history on file.     Family History  Problem Relation Age of Onset  . COPD Mother   . Pneumonia Mother   . Other Mother        CHF  . Hypertension Mother   . Arthritis Mother        septic knee after replacement  . Heart disease Mother        chf  . Dementia Father   . Arthritis Brother   . Asthma Daughter   . Colon polyps Daughter   . Stroke Maternal Grandmother   . Heart disease Maternal Grandmother   . Other Maternal Grandfather        CHF  . Heart disease Maternal Grandfather        chf  . Cancer Paternal Grandmother        stomach?  . Other Paternal Grandfather        problems with kidneys  . Kidney disease Paternal Grandfather   . Anxiety disorder Daughter   . Cancer Maternal Aunt        breast  . Cancer Maternal Aunt        liver  . Colon cancer Neg Hx     Social History   Tobacco Use  . Smoking status: Former Smoker    Packs/day: 1.50    Years: 30.00    Pack years: 45.00    Types: Cigarettes    Quit date: 10/13/1991    Years since quitting: 28.5  . Smokeless tobacco: Never Used  Vaping Use  . Vaping Use: Never used  Substance Use Topics  . Alcohol use: No    Alcohol/week: 0.0 standard drinks  . Drug use: No    Home Medications Prior to Admission medications   Medication Sig Start Date End Date Taking? Authorizing Provider  albuterol (VENTOLIN HFA) 108 (90 Base) MCG/ACT inhaler Inhale 2 puffs into the lungs every 6 (six) hours as needed. For wheezing 08/22/19   Chesley Mires, MD  ALPRAZolam Duanne Moron) 0.5 MG tablet TAKE 1/2 TO 1 TABLET BY MOUTH THREE TIMES  DAILY AS NEEDED FOR ANXIETY 04/25/20   Midge Minium, MD  amLODipine (NORVASC) 5 MG tablet TAKE 1 TABLET BY MOUTH EVERY DAY 02/28/20   Midge Minium, MD  benzonatate (TESSALON) 200 MG capsule Take 1 capsule (200 mg total) by mouth 3 (three) times  daily as needed for cough. 10/26/19   Parrett, Fonnie Mu, NP  budesonide-formoterol (SYMBICORT) 160-4.5 MCG/ACT inhaler Inhale 2 puffs into the lungs 2 (two) times daily. 12/08/19   Chesley Mires, MD  escitalopram (LEXAPRO) 5 MG tablet TAKE 1 TABLET(5 MG) BY MOUTH DAILY 02/21/20   Midge Minium, MD  fluticasone (FLONASE) 50 MCG/ACT nasal spray Place 1 spray into both nostrils daily. 12/08/19   Chesley Mires, MD  furosemide (LASIX) 20 MG tablet Take 20 mg by mouth. Take 2 tablet daily as needed    [provider]  guaifenesin (MUCUS RELIEF) 400 MG TABS tablet Take 400 mg by mouth 2 (two) times a day.    [provider]  ibuprofen (ADVIL,MOTRIN) 200 MG tablet Take 400 mg by mouth every 6 (six) hours as needed for moderate pain.    [provider]  ipratropium-albuterol (DUONEB) 0.5-2.5 (3) MG/3ML SOLN USE 3 ML VIA NEBULIZER FOUR TIMES DAILY 09/26/18   Noralee Space, MD  levothyroxine (SYNTHROID) 112 MCG tablet Take 1 tablet (112 mcg total) by mouth daily. 12/06/19   Midge Minium, MD  methylPREDNISolone (MEDROL DOSEPAK) 4 MG TBPK tablet Finish dose pack as directed. 04/17/20   Parrett, Fonnie Mu, NP  pantoprazole (PROTONIX) 40 MG tablet TAKE 1 TABLET BY MOUTH EVERY DAY 30 MINUTES BEFORE DINNER 04/09/20   Chesley Mires, MD  Respiratory Therapy Supplies (FLUTTER) DEVI 1 Device by Does not apply route in the morning, at noon, and at bedtime. 12/15/19   Martyn Ehrich, NP  Vitamin D, Ergocalciferol, (DRISDOL) 1.25 MG (50000 UNIT) CAPS capsule Take 1 capsule (50,000 Units total) by mouth every 7 (seven) days. 11/10/19   Midge Minium, MD  esomeprazole (NEXIUM) 40 MG capsule Take 40 mg by mouth daily before breakfast.     12/18/11  [provider]    Allergies    Prednisone, Codeine, and Tramadol  Review of Systems   Review of Systems  Constitutional: Negative for chills, diaphoresis and fever.  HENT: Negative for ear pain and sore throat.   Eyes: Negative for pain and visual disturbance.  Respiratory: Negative for cough and shortness of breath.   Cardiovascular: Negative for chest pain and palpitations.  Gastrointestinal: Positive for diarrhea. Negative for abdominal pain and vomiting.  Genitourinary: Negative for dysuria and hematuria.  Musculoskeletal: Negative for arthralgias and back pain.  Skin: Negative for color change and rash.  Neurological: Negative for seizures, syncope and headaches.  All other systems reviewed and are negative.   Physical Exam Updated Vital Signs BP (!) 151/73 (BP Location: Left Arm)   Pulse 73   Temp 98.6 F (37 C) (Oral)   Resp 18   Ht 5\' 5"  (1.651 m)   Wt 97.5 kg   SpO2 96%   BMI 35.78 kg/m   Physical Exam Vitals and nursing note reviewed.  Constitutional:      General: She is not in acute distress.    Appearance: She is well-developed.  HENT:     Head: Normocephalic and atraumatic.     Mouth/Throat:     Mouth: Mucous membranes are dry.  Eyes:     Conjunctiva/sclera: Conjunctivae normal.  Cardiovascular:     Rate and Rhythm: Normal rate and regular rhythm.     Heart sounds: No murmur heard.   Pulmonary:     Effort: Pulmonary effort is normal. No respiratory distress.     Breath sounds: Normal breath sounds.  Abdominal:     General: Abdomen is flat.  There is no distension.     Palpations: Abdomen is soft.     Tenderness: There is no abdominal tenderness. There is no guarding or rebound.  Musculoskeletal:     Cervical back: Neck supple.  Skin:    General: Skin is warm and dry.     Capillary Refill: Capillary refill takes less than 2 seconds.  Neurological:     General: No focal deficit present.     Mental Status: She is alert.      ED Results / Procedures / Treatments   Labs (all labs ordered are listed, but only abnormal results are displayed) Labs Reviewed  CBC WITH DIFFERENTIAL/PLATELET - Abnormal; Notable for the following components:      Result Value   HCT 46.4 (*)    All other components within normal limits  COMPREHENSIVE METABOLIC PANEL - Abnormal; Notable for the following components:   Glucose, Bld 113 (*)    Calcium 8.1 (*)    Total Protein 5.8 (*)    Albumin 3.2 (*)    GFR calc non Af Amer 55 (*)    All other components within normal limits  LIPASE, BLOOD    EKG None  Radiology No results found.  Procedures Procedures (including critical care time)  Medications Ordered in ED Medications  sodium chloride 0.9 % bolus 1,000 mL ( Intravenous Stopped 04/28/20 1048)  ondansetron (ZOFRAN) injection 4 mg (4 mg Intravenous Given 04/28/20 0941)    ED Course  I have reviewed the triage vital signs and the nursing notes.  Pertinent labs & imaging results that were available during my care of the patient were reviewed by me and considered in my medical decision making (see chart for details).    MDM Rules/Calculators/A&P                          Jenny [name] is an 79 year old female with history of asthma who presents to the ED with diarrhea.  Patient with normal vitals.  No fever.  Patient concerned for dehydration.  Diarrhea for the last 4 days.  No recent antibiotics.  Doubt C. difficile.  Possibly suspicious food intake.  No abdominal pain.  Had one episode of vomiting several days ago but none since.  Has been able to tolerate p.o.  We will get basic labs, give IV fluids, IV Zofran.  No suspicious travel or other exposures.  Likely foodborne process.  Lab work is unremarkable.  No significant anemia, electrolyte abnormality.  Discharged in good condition.  Recommend brat diet.  This chart was dictated using voice recognition software.  Despite best efforts to proofread,  errors can  occur which can change the documentation meaning.    Final Clinical Impression(s) / ED Diagnoses Final diagnoses:  Diarrhea, unspecified type    Rx / DC Orders ED Discharge Orders    None       Lennice Sites, DO 04/28/20 1119

## 2020-05-01 NOTE — Telephone Encounter (Signed)
Patient approved for Dupixent. Will she be receiving in office or training for self-administration?  Thanks.   Mariella Saa, PharmD, [place], CPP Clinical Specialty Pharmacist (Rheumatology and Pulmonology)  05/01/2020 3:41 PM

## 2020-05-01 NOTE — Telephone Encounter (Signed)
Received a fax from  [place] regarding an Approval for [place] patient assistance from 04/30/20 to 10/11/20. Patient will receive free medication from [facility]  Phone number: [phone], option 1

## 2020-05-02 NOTE — Telephone Encounter (Signed)
See patient message regarding new injection start.

## 2020-05-03 NOTE — Telephone Encounter (Signed)
In office if insurance will cover  Please

## 2020-05-06 NOTE — Telephone Encounter (Signed)
Called [employer] to schedule Patients Dupixent shipment. Dupixent prescription has not be activated. I was advised to call back in 1-2 hours.

## 2020-05-07 ENCOUNTER — Encounter (HOSPITAL_BASED_OUTPATIENT_CLINIC_OR_DEPARTMENT_OTHER): Payer: Self-pay | Admitting: Emergency Medicine

## 2020-05-07 ENCOUNTER — Other Ambulatory Visit: Payer: Self-pay

## 2020-05-07 ENCOUNTER — Emergency Department (HOSPITAL_BASED_OUTPATIENT_CLINIC_OR_DEPARTMENT_OTHER)
Admission: EM | Admit: 2020-05-07 | Discharge: 2020-05-07 | Disposition: A | Discharge status: Home / Self Care | Payer: Medicare HMO | Attending: Emergency Medicine | Admitting: Emergency Medicine

## 2020-05-07 DIAGNOSIS — Z7951 Long term (current) use of inhaled steroids: Secondary | ICD-10-CM | POA: Insufficient documentation

## 2020-05-07 DIAGNOSIS — E039 Hypothyroidism, unspecified: Secondary | ICD-10-CM | POA: Insufficient documentation

## 2020-05-07 DIAGNOSIS — Z85038 Personal history of other malignant neoplasm of large intestine: Secondary | ICD-10-CM | POA: Diagnosis not present

## 2020-05-07 DIAGNOSIS — I10 Essential (primary) hypertension: Secondary | ICD-10-CM | POA: Insufficient documentation

## 2020-05-07 DIAGNOSIS — R110 Nausea: Secondary | ICD-10-CM | POA: Diagnosis not present

## 2020-05-07 DIAGNOSIS — J449 Chronic obstructive pulmonary disease, unspecified: Secondary | ICD-10-CM | POA: Diagnosis not present

## 2020-05-07 DIAGNOSIS — Z87891 Personal history of nicotine dependence: Secondary | ICD-10-CM | POA: Diagnosis not present

## 2020-05-07 DIAGNOSIS — R42 Dizziness and giddiness: Secondary | ICD-10-CM | POA: Insufficient documentation

## 2020-05-07 DIAGNOSIS — J45909 Unspecified asthma, uncomplicated: Secondary | ICD-10-CM | POA: Diagnosis not present

## 2020-05-07 DIAGNOSIS — Z79899 Other long term (current) drug therapy: Secondary | ICD-10-CM | POA: Diagnosis not present

## 2020-05-07 DIAGNOSIS — Z79890 Hormone replacement therapy (postmenopausal): Secondary | ICD-10-CM | POA: Insufficient documentation

## 2020-05-07 DIAGNOSIS — Z7952 Long term (current) use of systemic steroids: Secondary | ICD-10-CM | POA: Insufficient documentation

## 2020-05-07 LAB — URINALYSIS, ROUTINE W REFLEX MICROSCOPIC
Bilirubin Urine: NEGATIVE
Glucose, UA: NEGATIVE mg/dL
Hgb urine dipstick: NEGATIVE
Ketones, ur: NEGATIVE mg/dL
Nitrite: NEGATIVE
Protein, ur: NEGATIVE mg/dL
Specific Gravity, Urine: 1.02 (ref 1.005–1.030)
pH: 6.5 (ref 5.0–8.0)

## 2020-05-07 LAB — CBC
HCT: 46.6 % — ABNORMAL HIGH (ref 36.0–46.0)
Hemoglobin: 14.6 g/dL (ref 12.0–15.0)
MCH: 29.5 pg (ref 26.0–34.0)
MCHC: 31.3 g/dL (ref 30.0–36.0)
MCV: 94.1 fL (ref 80.0–100.0)
Platelets: 295 10*3/uL (ref 150–400)
RBC: 4.95 MIL/uL (ref 3.87–5.11)
RDW: 13.6 % (ref 11.5–15.5)
WBC: 7.6 10*3/uL (ref 4.0–10.5)
nRBC: 0 % (ref 0.0–0.2)

## 2020-05-07 LAB — BASIC METABOLIC PANEL
Anion gap: 11 (ref 5–15)
BUN: 15 mg/dL (ref 8–23)
CO2: 27 mmol/L (ref 22–32)
Calcium: 9 mg/dL (ref 8.9–10.3)
Chloride: 103 mmol/L (ref 98–111)
Creatinine, Ser: 1.03 mg/dL — ABNORMAL HIGH (ref 0.44–1.00)
GFR calc Af Amer: 59 mL/min — ABNORMAL LOW (ref >60)
GFR calc non Af Amer: 51 mL/min — ABNORMAL LOW (ref >60)
Glucose, Bld: 159 mg/dL — ABNORMAL HIGH (ref 70–99)
Potassium: 4.6 mmol/L (ref 3.5–5.1)
Sodium: 141 mmol/L (ref 135–145)

## 2020-05-07 LAB — URINALYSIS, MICROSCOPIC (REFLEX)

## 2020-05-07 MED ORDER — MECLIZINE HCL 25 MG PO TABS
25.0000 mg | ORAL_TABLET | Freq: Once | ORAL | Status: AC
  Administered 2020-05-07: 25 mg via ORAL
  Filled 2020-05-07: qty 1

## 2020-05-07 MED ORDER — MECLIZINE HCL 25 MG PO TABS: 25.0000 mg | ORAL_TABLET | Freq: Three times a day (TID) | ORAL | 0 refills | Status: DC | PRN

## 2020-05-07 NOTE — ED Provider Notes (Signed)
[facility] EMERGENCY DEPARTMENT Provider Note   CSN: [id number] Arrival date & time: 05/07/20  1324     History Chief Complaint  Patient presents with  . Dizziness    Jenny [name] is a 79 y.o. 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  Patient reports that she woke up feeling vertiginous yesterday.  She has never had vertigo before.  She describes a sensation of room spinning that is worse with changing her position, particularly worse with looking downwards.  The symptoms have been waxing and waning for the past 2 days.  Currently while seated in the ED she is asymptomatic, but when attempting to move or stand up she does feel vertiginous again.  She denies a headache.  She denies any numbness or weakness in her extremities.  She denies any neck pain.  She denies any history of stroke, TIA, or A Fib.  She quit smoking 25 years ago.  HPI     Past Medical History:  Diagnosis Date  . ALLERGIC RHINITIS 08/23/2007  . Allergy    rhinitis  . 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 Anemia 09/21/2011  . Annual physical exam 08/06/2013  . Asthma    mild, intermittent  . Asthma 01/14/2011  . Atrophic vaginitis 06/17/2012  . BACK PAIN, LUMBAR 08/23/2007  . Bronchitis   . BRONCHITIS, RECURRENT 02/22/2008  . Cough    usually not productive  . Degenerative joint disease   . DEGENERATIVE JOINT DISEASE 02/22/2008  . Depression with anxiety 03/06/2008   Qualifier: Diagnosis of  By: Lenna Gilford MD, Deborra Medina   . Diverticulosis of colon   . DIVERTICULOSIS OF COLON 02/22/2008  . Dysthymia   . 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  . elevated glucose 08/21/2008  . GERD 08/23/2007  . GERD (gastroesophageal reflux disease)   . HAND PAIN 04/09/2010  . History of chicken pox 01/14/2011  . History of measles 01/14/2011  . Hyperglycemia 07/07/2015  . Hypertension   . HYPERTENSION 02/22/2008  . Hypothyroidism   . HYPOTHYROIDISM 08/23/2007    . Incontinence of urine   . Lumbar back pain   . Obesity   . OBESITY 08/23/2007  . Obstructive sleep apnea    does not use CPAP, lost weight  . Other urinary incontinence 03/12/2010  . Peripheral neuropathy 02/12/2011   possibly related to spondylolisthesis per pt  . PONV (postoperative nausea and vomiting)   . Sleep apnea   . SLEEP APNEA, OBSTRUCTIVE 08/23/2007  . Transient memory loss March 2006   after a stressful event she had no memory the rest of that day  . Urinary frequency   . Urinary incontinence   . UTI (lower urinary tract infection) 03/23/2012  . Venous insufficiency   . VENOUS INSUFFICIENCY 08/23/2007  . Vitamin D deficiency   . VITAMIN D DEFICIENCY 08/21/2008    Patient Active Problem List   Diagnosis Date Noted  . Abnormal CT of the chest 04/18/2020  . Cough 07/06/2018  . Peripheral edema 02/22/2017  . Localized edema 12/17/2016  . Colon cancer screening 06/15/2016  . COPD mixed type ([place]) 02/10/2016  . Other fatigue 11/03/2015  . Hyperglycemia 07/07/2015  . Ceruminosis 03/07/2015  . Left hip pain 04/26/2014  . Acute pain of left knee 04/26/2014  . Annual physical exam 08/06/2013  . Atrophic vaginitis 06/17/2012  . Peripheral vascular disease, unspecified ([place]) 06/02/2012  . Urticaria 10/15/2011  . Anemia 09/21/2011  . Spondylolisthesis of  lumbar region 08/25/2011  . Vitamin D deficiency 02/12/2011  . Peripheral neuropathy 02/12/2011  . Intertrigo 01/21/2011  . Asthma 01/14/2011  . 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 History of chicken pox 01/14/2011  . History of measles 01/14/2011  . HAND PAIN 04/09/2010  . OTHER URINARY INCONTINENCE 03/12/2010  . Depression with anxiety 03/06/2008  . Essential hypertension 02/22/2008  . Chronic bronchitis ([place]) 02/22/2008  . DIVERTICULOSIS OF COLON 02/22/2008  . Unilateral primary osteoarthritis, left knee 02/22/2008  . Hypothyroidism 08/23/2007  . Obesity 08/23/2007  . Obstructive sleep apnea 08/23/2007  .  Venous (peripheral) insufficiency 08/23/2007  . Allergic rhinitis 08/23/2007  . GERD 08/23/2007  . BACK PAIN, LUMBAR 08/23/2007    Past Surgical History:  Procedure Laterality Date  . ABDOMINAL HYSTERECTOMY     took one ovary  . AP repair and sling for cystocele  8-04    Dr Gaynelle Arabian  . APPENDECTOMY    . carpal tunel  2008  . CHOLECYSTECTOMY    . ctr     bilateral  . EYE SURGERY    . left hand  2010   left thumb, joint removal, CTR  . NECK SURGERY     x 2 - discectomy, fusion  . SHOULDER ARTHROSCOPY DISTAL CLAVICLE EXCISION AND OPEN ROTATOR CUFF REPAIR     right  . TONSILLECTOMY AND ADENOIDECTOMY    . TUBAL LIGATION       OB History   No obstetric history on file.     Family History  Problem Relation Age of Onset  . COPD Mother   . Pneumonia Mother   . Other Mother        CHF  . Hypertension Mother   . Arthritis Mother        septic knee after replacement  . Heart disease Mother        chf  . Dementia Father   . Arthritis Brother   . Asthma Daughter   . Colon polyps Daughter   . Stroke Maternal Grandmother   . Heart disease Maternal Grandmother   . Other Maternal Grandfather        CHF  . Heart disease Maternal Grandfather        chf  . Cancer Paternal Grandmother        stomach?  . Other Paternal Grandfather        problems with kidneys  . Kidney disease Paternal Grandfather   . Anxiety disorder Daughter   . Cancer Maternal Aunt        breast  . Cancer Maternal Aunt        liver  . Colon cancer Neg Hx     Social History   Tobacco Use  . Smoking status: Former Smoker    Packs/day: 1.50    Years: 30.00    Pack years: 45.00    Types: Cigarettes    Quit date: 10/13/1991    Years since quitting: 28.5  . Smokeless tobacco: Never Used  Vaping Use  . Vaping Use: Never used  Substance Use Topics  . Alcohol use: No    Alcohol/week: 0.0 standard drinks  . Drug use: No    Home Medications Prior to Admission medications   Medication Sig Start  Date End Date Taking? Authorizing Provider  albuterol (VENTOLIN HFA) 108 (90 Base) MCG/ACT inhaler Inhale 2 puffs into the lungs every 6 (six) hours as needed. For wheezing 08/22/19   Chesley Mires, MD  ALPRAZolam Duanne Moron) 0.5 MG tablet TAKE 1/2  TO 1 TABLET BY MOUTH THREE TIMES DAILY AS NEEDED FOR ANXIETY 04/25/20   Midge Minium, MD  amLODipine (NORVASC) 5 MG tablet TAKE 1 TABLET BY MOUTH EVERY DAY 02/28/20   Midge Minium, MD  benzonatate (TESSALON) 200 MG capsule Take 1 capsule (200 mg total) by mouth 3 (three) times daily as needed for cough. 10/26/19   Parrett, Fonnie Mu, NP  budesonide-formoterol (SYMBICORT) 160-4.5 MCG/ACT inhaler Inhale 2 puffs into the lungs 2 (two) times daily. 12/08/19   Chesley Mires, MD  escitalopram (LEXAPRO) 5 MG tablet TAKE 1 TABLET(5 MG) BY MOUTH DAILY 02/21/20   Midge Minium, MD  fluticasone (FLONASE) 50 MCG/ACT nasal spray Place 1 spray into both nostrils daily. 12/08/19   Chesley Mires, MD  furosemide (LASIX) 20 MG tablet Take 20 mg by mouth. Take 2 tablet daily as needed    [provider]  guaifenesin (MUCUS RELIEF) 400 MG TABS tablet Take 400 mg by mouth 2 (two) times a day.    [provider]  ibuprofen (ADVIL,MOTRIN) 200 MG tablet Take 400 mg by mouth every 6 (six) hours as needed for moderate pain.    [provider]  ipratropium-albuterol (DUONEB) 0.5-2.5 (3) MG/3ML SOLN USE 3 ML VIA NEBULIZER FOUR TIMES DAILY 09/26/18   Noralee Space, MD  levothyroxine (SYNTHROID) 112 MCG tablet Take 1 tablet (112 mcg total) by mouth daily. 12/06/19   Midge Minium, MD  meclizine (ANTIVERT) 25 MG tablet Take 1 tablet (25 mg total) by mouth 3 (three) times daily as needed for up to 30 doses for dizziness. 05/07/20   Wyvonnia Dusky, MD  methylPREDNISolone (MEDROL DOSEPAK) 4 MG TBPK tablet Finish dose pack as directed. 04/17/20   Parrett, Fonnie Mu, NP  pantoprazole (PROTONIX) 40 MG tablet TAKE 1 TABLET BY MOUTH EVERY DAY 30 MINUTES BEFORE  DINNER 04/09/20   Chesley Mires, MD  Respiratory Therapy Supplies (FLUTTER) DEVI 1 Device by Does not apply route in the morning, at noon, and at bedtime. 12/15/19   Martyn Ehrich, NP  Vitamin D, Ergocalciferol, (DRISDOL) 1.25 MG (50000 UNIT) CAPS capsule Take 1 capsule (50,000 Units total) by mouth every 7 (seven) days. 11/10/19   Midge Minium, MD  esomeprazole (NEXIUM) 40 MG capsule Take 40 mg by mouth daily before breakfast.    12/18/11  [provider]    Allergies    Prednisone, Codeine, and Tramadol  Review of Systems   Review of Systems  Constitutional: Negative for chills and fever.  HENT: Negative for ear discharge and ear pain.   Eyes: Negative for pain and visual disturbance.  Respiratory: Negative for cough and shortness of breath.   Cardiovascular: Negative for chest pain and palpitations.  Gastrointestinal: Positive for nausea. Negative for abdominal pain and vomiting.  Genitourinary: Negative for difficulty urinating and dysuria.  Musculoskeletal: Negative for arthralgias, back pain, neck pain and neck stiffness.  Skin: Negative for color change and rash.  Neurological: Positive for dizziness. Negative for syncope, facial asymmetry, speech difficulty, weakness, light-headedness, numbness and headaches.  Psychiatric/Behavioral: Negative for agitation and confusion.  All other systems reviewed and are negative.   Physical Exam Updated Vital Signs BP (!) 159/86   Pulse 73   Temp 98.1 F (36.7 C) (Oral)   Resp 18   Ht 5\' 5"  (1.651 m)   Wt (!) 97.5 kg   SpO2 95%   BMI 35.78 kg/m   Physical Exam Vitals and nursing note reviewed.  Constitutional:  General: She is not in acute distress.    Appearance: She is well-developed.  HENT:     Head: Normocephalic and atraumatic.  Eyes:     General: No visual field deficit.    Conjunctiva/sclera: Conjunctivae normal.  Cardiovascular:     Rate and Rhythm: Normal rate and regular rhythm.     Pulses:  Normal pulses.  Pulmonary:     Effort: Pulmonary effort is normal. No respiratory distress.     Breath sounds: Normal breath sounds.  Abdominal:     Palpations: Abdomen is soft.     Tenderness: There is no abdominal tenderness.  Musculoskeletal:     Cervical back: Normal range of motion and neck supple.  Skin:    General: Skin is warm and dry.  Neurological:     Mental Status: She is alert.     GCS: GCS eye subscore is 4. GCS verbal subscore is 5. GCS motor subscore is 6.     Cranial Nerves: Cranial nerves are intact. No cranial nerve deficit, dysarthria or facial asymmetry.     Sensory: Sensation is intact.     Motor: Motor function is intact.     Coordination: Coordination is intact. Romberg sign negative.     Gait: Gait is intact.     Comments: Head impulse shows corrective saccades. Nystagmus present with unilateral horizontal gaze (on rightward gaze) Test of skew with no skew   Psychiatric:        Mood and Affect: Mood normal.        Behavior: Behavior normal.     ED Results / Procedures / Treatments   Labs (all labs ordered are listed, but only abnormal results are displayed) Labs Reviewed  BASIC METABOLIC PANEL - Abnormal; Notable for the following components:      Result Value   Glucose, Bld 159 (*)    Creatinine, Ser 1.03 (*)    GFR calc non Af Amer 51 (*)    GFR calc Af Amer 59 (*)    All other components within normal limits  CBC - Abnormal; Notable for the following components:   HCT 46.6 (*)    All other components within normal limits  URINALYSIS, ROUTINE W REFLEX MICROSCOPIC - Abnormal; Notable for the following components:   APPearance HAZY (*)    Leukocytes,Ua TRACE (*)    All other components within normal limits  URINALYSIS, MICROSCOPIC (REFLEX) - Abnormal; Notable for the following components:   Bacteria, UA FEW (*)    All other components within normal limits    EKG EKG Interpretation  Date/Time:  Tuesday May 07 2020 10:17:47  EDT Ventricular Rate:  73 PR Interval:    QRS Duration: 87 QT Interval:  390 QTC Calculation: 430 R Axis:   -66 Text Interpretation: Sinus rhythm Left anterior fascicular block Abnormal R-wave progression, late transition Baseline wander in lead(s) V3 No STEMI Confirmed by Octaviano Glow [phone]) on 05/07/2020 10:56:27 AM   Radiology No results found.  Procedures Procedures (including critical care time)  Medications Ordered in ED Medications  meclizine (ANTIVERT) tablet 25 mg (25 mg Oral Given 05/07/20 1124)    ED Course  I have reviewed the triage vital signs and the nursing notes.  Pertinent labs & imaging results that were available during my care of the patient were reviewed by me and considered in my medical decision making (see chart for details).  79 yo female presenting to the ED with 2 days of vertigo, onset yesterday, which appears to be  extremely positional.  She has an overall benign neurological exam.  Although initially asymptomatic at rest, she did become symptomatic with repositioning, and I was able to perform a complete HINTS exam at this time, which was suggestive of peripheral vertigo.  I felt this was more likely the cause than a posterior or cerebellar stroke.  I gave the patient meclizine, and assessed her basic labs (electrolytes wnl) and UA (no likely infection noted).  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    After reassessment and ambulating the patient, I felt she was stable for discharge with meclizine and PCP follow up.  Her granddaughter was present for the entire exam and would be taking her home.  The patient lives with 2 grandsons and has help around the house.  We did discuss return precautions with a focus on new neurological symptoms for a developing stroke.  I answered all of their questions to the best of my ability.  Clinical Course as of May 07 1699  Tue May 07, 2020  1303 She is feeling symptomatically better, still has some mild to moderate dizziness  with sitting and standing but able to ambulate.  Okay for discharge with daughter with meclizine for suspected peripheral vertigo   [MT]    Clinical Course User Index [MT] [place], Carola Rhine, MD    Final Clinical Impression(s) / ED Diagnoses Final diagnoses:  Vertigo    Rx / DC Orders ED Discharge Orders         Ordered    meclizine (ANTIVERT) 25 MG tablet  3 times daily PRN     Discontinue  Reprint     05/07/20 1305           Wyvonnia Dusky, MD 05/07/20 1700

## 2020-05-07 NOTE — Discharge Instructions (Signed)
You were treated for vertigo in the ER.  Based on your clinical exam, I suspect this is caused by an ear condition called BPPV.  You can see an ENT (ears nose and throat) doctor for this if you are still having symptoms in 1 week.  I included the number for Dr Marcelline Deist above.  They can help with management for this condition.  You can use meclizine at home for vertigo symptoms.   Move slowly and use your cane.  Be careful with standing and sitting upright.    We talked about the possibility that these symptoms could be a sign of stroke.  I did not feel this was the case today.  However, if you notice new symptoms, like severe headache, numbness in your face or hands or feet, weakness, difficulty speaking, or facial droop, you should return immediately to the ER.

## 2020-05-07 NOTE — ED Triage Notes (Signed)
Dizziness yesterday when she woke up.  Last 2 hours.  Today, she woke up again with dizziness. Pt states if she looks down, she feels lightheaded and feels like her head feels full.  Pt admits to not wearing her cpap correctly for last two nights.  Pt states she felt like she felt like it was smothering her and had taken it off 2 nights ago at 3 am.  No visual changes, no weakness, no facial droop.

## 2020-05-08 NOTE — Telephone Encounter (Signed)
Spoke with [facility]. States that pt's medication was sent to the pt's home, she should have received the medication today. Spoke with pt. She received her medication yesterday. Pt is aware of our office policy when it comes to her first injection >> 2 hours wait, Epipen. Rx for Epipen is not needed as the pt already has one. Appointment has been scheduled for 05/13/2020 at 1030. Nothing further needed at this time.

## 2020-05-09 ENCOUNTER — Telehealth: Payer: Self-pay | Admitting: Pulmonary Disease

## 2020-05-09 NOTE — Telephone Encounter (Signed)
Lab tests from visit with Tammy are normal.

## 2020-05-09 NOTE — Telephone Encounter (Signed)
Called and spoke with pt. Pt is requesting to know the results of her recent labwork. Dr. Halford Chessman, please advise.

## 2020-05-09 NOTE — Telephone Encounter (Signed)
Located the results of pt's labwork:  Parrett, Fonnie Mu, NP  Randa Spike, CMA Autoimmune labs are essentially all negative rheumatoid factor was slightly elevated with a negative CCP. We will discuss in detail at follow-up visit. Continue with office visit recommendations  Please contact office for sooner follow up if symptoms do not improve or worsen or seek emergency care    Called and spoke with pt letting her know the results of the labwork and she verbalized understanding. Nothing further needed.

## 2020-05-13 ENCOUNTER — Other Ambulatory Visit: Payer: Self-pay

## 2020-05-13 ENCOUNTER — Ambulatory Visit (INDEPENDENT_AMBULATORY_CARE_PROVIDER_SITE_OTHER): Payer: Medicare HMO

## 2020-05-13 DIAGNOSIS — J4550 Severe persistent asthma, uncomplicated: Secondary | ICD-10-CM | POA: Diagnosis not present

## 2020-05-13 MED ORDER — DUPILUMAB 300 MG/2ML ~~LOC~~ SOSY
600.0000 mg | PREFILLED_SYRINGE | Freq: Once | SUBCUTANEOUS | Status: AC
  Administered 2020-05-13: 600 mg via SUBCUTANEOUS

## 2020-05-13 NOTE — Progress Notes (Signed)
Patient presented to the office today for first-time Dupixent injection.  Primary Pulmonologist: Chesley Mires MD Medication name: Dupixent Strength: 600mg  Site(s): L and R Arm  Epi pen/Auvi-Q visible during appointment: Yes  Time of injection: 1015  Patient evaluated every 15-20 minutes per protocol x2 hours.  1st check: 1030 Evaluation: No reaction  2nd check: 1050  Evaluation: No reaction  3rd check: 1110  Evaluation: No reaction  4th check: 1130   Evaluation: No reaction  5th check: 1150  Evaluation: No reaction  6th check: 1210 Evaluation: No reaction

## 2020-05-16 ENCOUNTER — Other Ambulatory Visit: Payer: Self-pay | Admitting: Family Medicine

## 2020-05-16 DIAGNOSIS — Z20822 Contact with and (suspected) exposure to covid-19: Secondary | ICD-10-CM | POA: Diagnosis not present

## 2020-05-20 MED ORDER — BUDESONIDE-FORMOTEROL FUMARATE 160-4.5 MCG/ACT IN AERO: 2.0000 | INHALATION_SPRAY | Freq: Two times a day (BID) | RESPIRATORY_TRACT | 3 refills | Status: DC

## 2020-05-20 NOTE — Telephone Encounter (Signed)
This is for patient assistance for Symbicort, not Dupixent.

## 2020-05-20 NOTE — Telephone Encounter (Signed)
Routing to injection pool for follow-up.  Thanks!

## 2020-05-21 DIAGNOSIS — H26493 Other secondary cataract, bilateral: Secondary | ICD-10-CM | POA: Diagnosis not present

## 2020-05-21 DIAGNOSIS — H26492 Other secondary cataract, left eye: Secondary | ICD-10-CM | POA: Diagnosis not present

## 2020-05-27 ENCOUNTER — Other Ambulatory Visit: Payer: Self-pay

## 2020-05-27 ENCOUNTER — Ambulatory Visit (INDEPENDENT_AMBULATORY_CARE_PROVIDER_SITE_OTHER): Payer: Medicare HMO

## 2020-05-27 DIAGNOSIS — J4550 Severe persistent asthma, uncomplicated: Secondary | ICD-10-CM | POA: Diagnosis not present

## 2020-05-27 MED ORDER — DUPILUMAB 300 MG/2ML ~~LOC~~ SOSY
300.0000 mg | PREFILLED_SYRINGE | Freq: Once | SUBCUTANEOUS | Status: AC
  Administered 2020-05-27: 300 mg via SUBCUTANEOUS

## 2020-05-27 NOTE — Progress Notes (Signed)
Have you been hospitalized within the last 10 days?  No Do you have a fever?  No Do you have a cough?  No Do you have a headache or sore throat? No Do you have your Epi Pen visible and is it within date?  Yes 

## 2020-05-28 ENCOUNTER — Other Ambulatory Visit: Payer: Self-pay | Admitting: General Practice

## 2020-05-28 MED ORDER — AMLODIPINE BESYLATE 5 MG PO TABS: 5.0000 mg | ORAL_TABLET | Freq: Every day | ORAL | 0 refills | Status: DC

## 2020-05-31 ENCOUNTER — Other Ambulatory Visit: Payer: Self-pay | Admitting: Family Medicine

## 2020-06-04 NOTE — Telephone Encounter (Signed)
erroneos encounter

## 2020-06-10 ENCOUNTER — Telehealth: Payer: Self-pay | Admitting: Pulmonary Disease

## 2020-06-10 ENCOUNTER — Other Ambulatory Visit: Payer: Self-pay

## 2020-06-10 ENCOUNTER — Ambulatory Visit (INDEPENDENT_AMBULATORY_CARE_PROVIDER_SITE_OTHER): Payer: Medicare HMO

## 2020-06-10 DIAGNOSIS — J4550 Severe persistent asthma, uncomplicated: Secondary | ICD-10-CM | POA: Diagnosis not present

## 2020-06-10 MED ORDER — DUPILUMAB 300 MG/2ML ~~LOC~~ SOSY
300.0000 mg | PREFILLED_SYRINGE | Freq: Once | SUBCUTANEOUS | Status: AC
  Administered 2020-06-10: 300 mg via SUBCUTANEOUS

## 2020-06-10 NOTE — Telephone Encounter (Signed)
Dupixent Order: 300mg  #2 prefilled syringe Ordered Date: 06/11/20 Expected date of arrival: 06/19/20 Ordered by: [place]: [name]

## 2020-06-10 NOTE — Progress Notes (Signed)
Have you been hospitalized within the last 10 days?  No Do you have a fever?  No Do you have a cough?  No Do you have a headache or sore throat? No Do you have your Epi Pen visible and is it within date?  Yes 

## 2020-06-12 ENCOUNTER — Telehealth: Payer: Self-pay | Admitting: Adult Health

## 2020-06-12 NOTE — Telephone Encounter (Signed)
[facility] [employer] patient assistance, additional paperwork, faxed. Information included medications, allergies, and patient problem list. [phone]

## 2020-06-19 ENCOUNTER — Other Ambulatory Visit: Payer: Self-pay

## 2020-06-19 ENCOUNTER — Ambulatory Visit: Payer: Medicare HMO | Admitting: Pulmonary Disease

## 2020-06-19 ENCOUNTER — Encounter: Payer: Self-pay | Admitting: Pulmonary Disease

## 2020-06-19 VITALS — BP 120/70 | HR 88 | Temp 97.7°F | Ht 65.0 in | Wt 216.4 lb

## 2020-06-19 DIAGNOSIS — J8283 Eosinophilic asthma: Secondary | ICD-10-CM | POA: Diagnosis not present

## 2020-06-19 DIAGNOSIS — J8489 Other specified interstitial pulmonary diseases: Secondary | ICD-10-CM | POA: Diagnosis not present

## 2020-06-19 DIAGNOSIS — R911 Solitary pulmonary nodule: Secondary | ICD-10-CM

## 2020-06-19 DIAGNOSIS — J309 Allergic rhinitis, unspecified: Secondary | ICD-10-CM | POA: Diagnosis not present

## 2020-06-19 NOTE — Progress Notes (Signed)
[facility]  Chief Complaint  Patient presents with  . Follow-up    pt takes dupxient is working well    Constitutional:  BP 120/70 (BP Location: Left Arm, Cuff Size: Normal)   Pulse 88   Temp 97.7 F (36.5 C) (Oral)   Ht 5\' 5"  (1.651 m)   Wt 216 lb 6.4 oz (98.2 kg)   SpO2 96%   BMI 36.01 kg/m   Past Medical History:  Vit D deficiency, Neuropathy, Low back pain, Hypothyroidism, HTN, GERD, Diverticulosis, Depression, Anxiety, DJD  Past Surgical History:  Her  has a past surgical history that includes Appendectomy; Tonsillectomy and adenoidectomy; AP repair and sling for cystocele (8-04 ); left hand (2010); ctr; Shoulder arthroscopy distal clavicle excision and open rotator cuff repair; Neck surgery; Abdominal hysterectomy; Cholecystectomy; carpal tunel (2008); Tubal ligation; and Eye surgery.  Brief Summary:  Jenny [name] is a 79 y.o. female with former smoker eosinophilic asthma, allergic rhinitis, mild ILD, lung nodule and OSA, and COPD.      Subjective:  Doing well since she started dupixent.  Has mild dry skin, but otherwise no side effects.  Hasn't need albuterol.  Not needed ABx or medrol for past couple of months.    Has intermittent cough.  No wheeze or sputum.  Sinuses clear.  No issues with CPAP.  Physical Exam:   Appearance - well kempt   ENMT - no sinus tenderness, no oral exudate, no LAN, Mallampati 3 airway, no stridor  Respiratory - equal breath sounds bilaterally, faint wheeze Rt upper lung field that cleared with coughing  CV - s1s2 regular rate and rhythm, no murmurs  Ext - no clubbing, no edema  Skin - no rashes  Psych - normal mood and affect   Pulmonary testing:   Spirometry 04/21/18 >> FEV1 1.3 (64%), FEV1% 83  IgE 01/30/20 >> 40, Absolute eosinophils 600  PFT 04/17/20 >> FEV1 1.30 (65%), FEV1% 79, TLC 6.97 (133%), RV 5.11 (207%), DLCO 88%  Serology 04/17/20 >> ANA, HP panel, anti CCP  negative; RF 25  Chest Imaging:   HRCT chest 02/09/20 >> atherosclerosis, 3 mm nodule LLL, mild patchy subpleural reticulation and GGO b/l with mild basilar predominance  Sleep Tests:   PSG April 2008 >> AHI 29, SpO2 low 62%  Auto CPAP 11/07/19 to 12/05/18 >> used on 22 of 30 nights with average 8 hrs 40 min.  Average AHI 1.2 with median CPAP 7 and 95 th percentile CPAP 11 cm H2O.  Cardiac Tests:   Echo 03/16/17 >> EF 60 to 65%  Social History:  She  reports that she quit smoking about 28 years ago. Her smoking use included cigarettes. She has a 45.00 pack-year smoking history. She has never used smokeless tobacco. She reports that she does not drink alcohol and does not use drugs.  Family History:  Her family history includes Anxiety disorder in her daughter; Arthritis in her brother and mother; Asthma in her daughter; COPD in her mother; Cancer in her maternal aunt, maternal aunt, and paternal grandmother; Colon polyps in her daughter; Dementia in her father; Heart disease in her maternal grandfather, maternal grandmother, and mother; Hypertension in her mother; Kidney disease in her paternal grandfather; Other in her maternal grandfather, mother, and paternal grandfather; Pneumonia in her mother; Stroke in her maternal grandmother.    Discussion:    Assessment/Plan:   Eosinophilic asthma. - singulair caused nightmares - intolerant of prednisone, but can use medrol -  started dupixent in August 2021 - continue symbicort and prn albuterol  Allergic rhinitis with post nasal drip. - prn flonase, mucinex  ILD. - HRCT chest from 02/09/20 showed mild pattern suggestive of NSIP - PFT from July 2021 didn't show restriction or diffusion defect - serology from July 2021 was negative - monitor clinically, and f/u CT chest in April 2022  Lt lower lobe 3 mm lung nodule. - f/u CT chest in April 2022  Obstructive sleep apnea. - she is compliant with CPAP - uses [employer] for her DME -  continue auto CPAP  Time Spent Involved in Patient Care on Day of Examination:  32 minutes  Follow up:  Patient Instructions  Follow up in 4 months   Medication List:   Allergies as of 06/19/2020      Reactions   Prednisone Other (See Comments)   Confusion, dizziness Medrol is ok   Codeine Nausea Only       Tramadol Itching      Medication List       Accurate as of June 19, 2020  9:48 AM. If you have any questions, ask your nurse or doctor.        STOP taking these medications   ipratropium-albuterol 0.5-2.5 (3) MG/3ML Soln Commonly known as: DUONEB Stopped by: Chesley Mires, MD   methylPREDNISolone 4 MG Tbpk tablet Commonly known as: MEDROL DOSEPAK Stopped by: Chesley Mires, MD   Vitamin D (Ergocalciferol) 1.25 MG (50000 UNIT) Caps capsule Commonly known as: DRISDOL Stopped by: Chesley Mires, MD     TAKE these medications   albuterol 108 (90 Base) MCG/ACT inhaler Commonly known as: VENTOLIN HFA Inhale 2 puffs into the lungs every 6 (six) hours as needed. For wheezing   ALPRAZolam 0.5 MG tablet Commonly known as: XANAX TAKE 1/2 TO 1 TABLET BY MOUTH THREE TIMES DAILY AS NEEDED FOR ANXIETY   amLODipine 5 MG tablet Commonly known as: NORVASC Take 1 tablet (5 mg total) by mouth daily.   benzonatate 200 MG capsule Commonly known as: TESSALON Take 1 capsule (200 mg total) by mouth 3 (three) times daily as needed for cough.   budesonide-formoterol 160-4.5 MCG/ACT inhaler Commonly known as: SYMBICORT Inhale 2 puffs into the lungs 2 (two) times daily.   escitalopram 5 MG tablet Commonly known as: LEXAPRO TAKE 1 TABLET BY MOUTH EVERY DAY   fluticasone 50 MCG/ACT nasal spray Commonly known as: FLONASE Place 1 spray into both nostrils daily.   Flutter Devi 1 Device by Does not apply route in the morning, at noon, and at bedtime.   furosemide 20 MG tablet Commonly known as: LASIX Take 20 mg by mouth. Take 2 tablet daily as needed   ibuprofen 200 MG  tablet Commonly known as: ADVIL Take 400 mg by mouth every 6 (six) hours as needed for moderate pain.   levothyroxine 112 MCG tablet Commonly known as: SYNTHROID TAKE 1 TABLET BY MOUTH EVERY DAY   meclizine 25 MG tablet Commonly known as: ANTIVERT Take 1 tablet (25 mg total) by mouth 3 (three) times daily as needed for up to 30 doses for dizziness.   Mucus Relief 400 MG Tabs tablet Generic drug: guaifenesin Take 400 mg by mouth 2 (two) times a day.   pantoprazole 40 MG tablet Commonly known as: PROTONIX TAKE 1 TABLET BY MOUTH EVERY DAY 30 MINUTES BEFORE DINNER       Signature:  Chesley Mires, MD [place] Pager - [phone] 06/19/2020, 9:48 AM

## 2020-06-19 NOTE — Telephone Encounter (Signed)
Dupixent Shipment Received: 300mg  #2 prefilled syringe Medication arrival date: 06/19/20 Lot #: [id number] Exp date: 09/10/2022 Received by: Elliot Dally

## 2020-06-19 NOTE — Patient Instructions (Signed)
Follow up in 4 months 

## 2020-06-20 ENCOUNTER — Other Ambulatory Visit: Payer: Self-pay | Admitting: Family Medicine

## 2020-06-20 NOTE — Telephone Encounter (Signed)
Last OV 04/26/20 Alprazolam last filled 04/25/20 #90 with 0

## 2020-06-25 ENCOUNTER — Ambulatory Visit (INDEPENDENT_AMBULATORY_CARE_PROVIDER_SITE_OTHER): Payer: Medicare HMO

## 2020-06-25 ENCOUNTER — Other Ambulatory Visit: Payer: Self-pay

## 2020-06-25 DIAGNOSIS — J4550 Severe persistent asthma, uncomplicated: Secondary | ICD-10-CM | POA: Diagnosis not present

## 2020-06-25 MED ORDER — DUPILUMAB 300 MG/2ML ~~LOC~~ SOSY
300.0000 mg | PREFILLED_SYRINGE | Freq: Once | SUBCUTANEOUS | Status: AC
  Administered 2020-06-25: 300 mg via SUBCUTANEOUS

## 2020-06-25 NOTE — Progress Notes (Signed)
Have you been hospitalized within the last 10 days?  No Do you have a fever?  No Do you have a cough?  No Do you have a headache or sore throat? No Do you have your Epi Pen visible and is it within date?  Yes 

## 2020-07-09 ENCOUNTER — Ambulatory Visit (INDEPENDENT_AMBULATORY_CARE_PROVIDER_SITE_OTHER): Payer: Medicare HMO

## 2020-07-09 ENCOUNTER — Other Ambulatory Visit: Payer: Self-pay

## 2020-07-09 DIAGNOSIS — J4550 Severe persistent asthma, uncomplicated: Secondary | ICD-10-CM | POA: Diagnosis not present

## 2020-07-09 MED ORDER — DUPILUMAB 300 MG/2ML ~~LOC~~ SOSY
300.0000 mg | PREFILLED_SYRINGE | Freq: Once | SUBCUTANEOUS | Status: AC
  Administered 2020-07-09: 300 mg via SUBCUTANEOUS

## 2020-07-09 NOTE — Progress Notes (Signed)
Have you been hospitalized within the last 10 days?  No Do you have a fever?  No Do you have a cough?  No Do you have a headache or sore throat? No Do you have your Epi Pen visible and is it within date?  Yes 

## 2020-07-10 ENCOUNTER — Telehealth: Payer: Self-pay

## 2020-07-10 NOTE — Progress Notes (Signed)
Chronic Care Management Pharmacy Assistant   Name: Jenny [name]  MRN: [id number] DOB: 01-10-1941  Reason for Encounter: Disease State    PCP : Midge Minium, MD  Allergies:   Allergies  Allergen Reactions  . Prednisone Other (See Comments)    Confusion, dizziness Medrol is ok  . Codeine Nausea Only       . Tramadol Itching    Medications: Outpatient Encounter Medications as of 07/10/2020  Medication Sig Note  . albuterol (VENTOLIN HFA) 108 (90 Base) MCG/ACT inhaler Inhale 2 puffs into the lungs every 6 (six) hours as needed. For wheezing   . ALPRAZolam (XANAX) 0.5 MG tablet TAKE 1/2 TO 1 TABLET BY MOUTH THREE TIMES DAILY AS NEEDED FOR ANXIETY   . amLODipine (NORVASC) 5 MG tablet Take 1 tablet (5 mg total) by mouth daily.   . benzonatate (TESSALON) 200 MG capsule Take 1 capsule (200 mg total) by mouth 3 (three) times daily as needed for cough.   . budesonide-formoterol (SYMBICORT) 160-4.5 MCG/ACT inhaler Inhale 2 puffs into the lungs 2 (two) times daily.   Marland Kitchen escitalopram (LEXAPRO) 5 MG tablet TAKE 1 TABLET BY MOUTH EVERY DAY   . fluticasone (FLONASE) 50 MCG/ACT nasal spray Place 1 spray into both nostrils daily.   . furosemide (LASIX) 20 MG tablet Take 20 mg by mouth. Take 2 tablet daily as needed   . guaifenesin (MUCUS RELIEF) 400 MG TABS tablet Take 400 mg by mouth 2 (two) times a day.   . ibuprofen (ADVIL,MOTRIN) 200 MG tablet Take 400 mg by mouth every 6 (six) hours as needed for moderate pain.   Marland Kitchen levothyroxine (SYNTHROID) 112 MCG tablet TAKE 1 TABLET BY MOUTH EVERY DAY   . meclizine (ANTIVERT) 25 MG tablet Take 1 tablet (25 mg total) by mouth 3 (three) times daily as needed for up to 30 doses for dizziness.   . pantoprazole (PROTONIX) 40 MG tablet TAKE 1 TABLET BY MOUTH EVERY DAY 30 MINUTES BEFORE DINNER   . Respiratory Therapy Supplies (FLUTTER) DEVI 1 Device by Does not apply route in the morning, at noon, and at bedtime.   . [DISCONTINUED] esomeprazole  (NEXIUM) 40 MG capsule Take 40 mg by mouth daily before breakfast.   12/18/2011: Dr Marlyce Huge put pt on Zantac   No facility-administered encounter medications on file as of 07/10/2020.    Current Diagnosis: Patient Active Problem List   Diagnosis Date Noted  . Abnormal CT of the chest 04/18/2020  . Cough 07/06/2018  . Peripheral edema 02/22/2017  . Localized edema 12/17/2016  . Colon cancer screening 06/15/2016  . COPD mixed type ([place]) 02/10/2016  . Other fatigue 11/03/2015  . Hyperglycemia 07/07/2015  . Ceruminosis 03/07/2015  . Left hip pain 04/26/2014  . Acute pain of left knee 04/26/2014  . Annual physical exam 08/06/2013  . Atrophic vaginitis 06/17/2012  . Peripheral vascular disease, unspecified ([place]) 06/02/2012  . Urticaria 10/15/2011  . Anemia 09/21/2011  . 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  . Vitamin D deficiency 02/12/2011  . Peripheral neuropathy 02/12/2011  . Intertrigo 01/21/2011  . Asthma 01/14/2011  . 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 History of chicken pox 01/14/2011  . History of measles 01/14/2011  . HAND PAIN 04/09/2010  . OTHER URINARY INCONTINENCE 03/12/2010  . Depression with anxiety 03/06/2008  . Essential hypertension 02/22/2008  . Chronic bronchitis ([place]) 02/22/2008  . DIVERTICULOSIS OF COLON 02/22/2008  . Unilateral primary osteoarthritis, left knee 02/22/2008  . Hypothyroidism 08/23/2007  .  Obesity 08/23/2007  . Obstructive sleep apnea 08/23/2007  . Venous (peripheral) insufficiency 08/23/2007  . Allergic rhinitis 08/23/2007  . GERD 08/23/2007  . BACK PAIN, LUMBAR 08/23/2007    Reviewed chart prior to disease state call. Spoke with patient regarding BP  Recent Office Vitals: BP Readings from Last 3 Encounters:  06/19/20 120/70  05/07/20 (!) 159/86  04/28/20 (!) 151/73   Pulse Readings from Last 3 Encounters:  06/19/20 88  05/07/20 73  04/28/20 73    Wt Readings from Last 3 Encounters:  06/19/20 216 lb 6.4 oz (98.2  kg)  05/07/20 (!) 215 lb (97.5 kg)  04/28/20 215 lb (97.5 kg)     Kidney Function Lab Results  Component Value Date/Time   CREATININE 1.03 (H) 05/07/2020 10:41 AM   CREATININE 0.97 04/28/2020 10:11 AM   CREATININE 1.00 (H) 12/17/2016 03:21 PM   CREATININE 0.95 02/15/2015 04:17 PM   GFR 56.43 (L) 01/30/2020 09:39 AM   GFRNONAA 51 (L) 05/07/2020 10:41 AM   GFRAA 59 (L) 05/07/2020 10:41 AM    BMP Latest Ref Rng & Units 05/07/2020 04/28/2020 01/30/2020  Glucose 70 - 99 mg/dL 159(H) 113(H) 106(H)  BUN 8 - 23 mg/dL 15 8 13   Creatinine 0.44 - 1.00 mg/dL 1.03(H) 0.97 0.95  Sodium 135 - 145 mmol/L 141 140 140  Potassium 3.5 - 5.1 mmol/L 4.6 3.9 4.2  Chloride 98 - 111 mmol/L 103 108 106  CO2 22 - 32 mmol/L 27 26 29   Calcium 8.9 - 10.3 mg/dL 9.0 8.1(L) 9.3    . Current antihypertensive regimen:  o Irbesartan 150mg  Tab Take 1 tab by mouth daily ,  o Metoprolol Succinate  25mg  Take 1 tab by mouth every evening                       . How often are you checking your Blood Pressure? Does not check at home . Current home BP readings: N/A . What recent interventions/DTPs have been made by any provider to improve Blood Pressure control since last CPP Visit: None . Any recent hospitalizations or ED visits since last visit with CPP? No . What diet changes have been made to improve Blood Pressure Control?  o Does not follow a diet . What exercise is being done to improve your Blood Pressure Control?  o Patient states she has a weak back but does try to walk around in the house  Adherence Review: Is the patient currently on ACE/ARB medication? No Does the patient have >5 day gap between last estimated fill dates? No   [place] with patient and rescheduled appointment from 07-18-20 to 08-22-20 at 9:30am.  Georgiana Shore ,[place] Pharmacist Assistant [phone]  Follow-Up:  Pharmacist Review

## 2020-07-13 DIAGNOSIS — R69 Illness, unspecified: Secondary | ICD-10-CM | POA: Diagnosis not present

## 2020-07-15 ENCOUNTER — Telehealth: Payer: Self-pay | Admitting: Pulmonary Disease

## 2020-07-15 NOTE — Telephone Encounter (Signed)
Dupixent Order: 300mg  #2 prefilled syringe Ordered Date: 07/15/20 Expected date of arrival: 07/18/20 Ordered by: [place]: [name]

## 2020-07-18 ENCOUNTER — Telehealth: Payer: Medicare HMO

## 2020-07-18 NOTE — Telephone Encounter (Signed)
Dupixent Shipment Received: 300mg  #2 prefilled syringe Medication arrival date: 07/18/20 Lot #: [id number] Exp date: 11/11/2022 Received by: Elliot Dally

## 2020-07-19 ENCOUNTER — Other Ambulatory Visit: Payer: Self-pay | Admitting: Family Medicine

## 2020-07-19 NOTE — Telephone Encounter (Signed)
Last OV 04/26/20 Alprazolam last filled 06/20/20 #90 with 0

## 2020-07-23 ENCOUNTER — Ambulatory Visit (INDEPENDENT_AMBULATORY_CARE_PROVIDER_SITE_OTHER): Payer: Medicare HMO

## 2020-07-23 ENCOUNTER — Other Ambulatory Visit: Payer: Self-pay

## 2020-07-23 DIAGNOSIS — J4550 Severe persistent asthma, uncomplicated: Secondary | ICD-10-CM

## 2020-07-23 MED ORDER — DUPILUMAB 300 MG/2ML ~~LOC~~ SOSY
300.0000 mg | PREFILLED_SYRINGE | Freq: Once | SUBCUTANEOUS | Status: AC
  Administered 2020-07-23: 300 mg via SUBCUTANEOUS

## 2020-07-23 NOTE — Progress Notes (Signed)
Have you been hospitalized within the last 10 days?  No Do you have a fever?  No Do you have a cough?  No Do you have a headache or sore throat? No Do you have your Epi Pen visible and is it within date?  Yes 

## 2020-08-06 ENCOUNTER — Other Ambulatory Visit: Payer: Self-pay

## 2020-08-06 ENCOUNTER — Ambulatory Visit (INDEPENDENT_AMBULATORY_CARE_PROVIDER_SITE_OTHER): Payer: Medicare HMO

## 2020-08-06 DIAGNOSIS — J4550 Severe persistent asthma, uncomplicated: Secondary | ICD-10-CM | POA: Diagnosis not present

## 2020-08-06 MED ORDER — DUPILUMAB 300 MG/2ML ~~LOC~~ SOSY
300.0000 mg | PREFILLED_SYRINGE | Freq: Once | SUBCUTANEOUS | Status: AC
  Administered 2020-08-06: 300 mg via SUBCUTANEOUS

## 2020-08-06 NOTE — Progress Notes (Signed)
Have you been hospitalized within the last 10 days?  No Do you have a fever?  No Do you have a cough?  No Do you have a headache or sore throat? No Do you have your Epi Pen visible and is it within date?  Yes 

## 2020-08-09 ENCOUNTER — Other Ambulatory Visit: Payer: Self-pay | Admitting: *Deleted

## 2020-08-09 MED ORDER — DUPIXENT 300 MG/2ML ~~LOC~~ SOSY: 300.0000 mg | PREFILLED_SYRINGE | SUBCUTANEOUS | 5 refills | Status: DC

## 2020-08-12 ENCOUNTER — Telehealth: Payer: Self-pay | Admitting: Pulmonary Disease

## 2020-08-12 NOTE — Telephone Encounter (Signed)
Dupixent Order: 300mg  #2 prefilled syringe Ordered Date: 08/12/20 Expected date of arrival: 08/14/20 Ordered by: [place]: [name]

## 2020-08-14 NOTE — Telephone Encounter (Signed)
Dupixent Shipment Received: 300mg  #2 prefilled syringe Medication arrival date: 08/14/20 Lot #: [id number] Exp date: 12/09/2022 Received by: Elliot Dally

## 2020-08-19 NOTE — Telephone Encounter (Signed)
Tammy, please see pt's mychart message and advise.

## 2020-08-19 NOTE — Telephone Encounter (Signed)
That is fine, but should probably send Dr. [name] a message and see if she wants additional labs /and if time for repeat as TSH was normal and done earlier this year.

## 2020-08-20 ENCOUNTER — Other Ambulatory Visit: Payer: Self-pay

## 2020-08-20 ENCOUNTER — Ambulatory Visit (INDEPENDENT_AMBULATORY_CARE_PROVIDER_SITE_OTHER): Payer: Medicare HMO

## 2020-08-20 DIAGNOSIS — J4550 Severe persistent asthma, uncomplicated: Secondary | ICD-10-CM | POA: Diagnosis not present

## 2020-08-20 MED ORDER — DUPILUMAB 300 MG/2ML ~~LOC~~ SOSY
300.0000 mg | PREFILLED_SYRINGE | Freq: Once | SUBCUTANEOUS | Status: AC
  Administered 2020-08-20: 300 mg via SUBCUTANEOUS

## 2020-08-20 NOTE — Progress Notes (Signed)
Have you been hospitalized within the last 10 days?  No Do you have a fever?  No Do you have a cough?  No Do you have a headache or sore throat? No Do you have your Epi Pen visible and is it within date?  Yes 

## 2020-08-22 ENCOUNTER — Ambulatory Visit: Payer: Medicare HMO

## 2020-08-22 NOTE — Progress Notes (Deleted)
Chronic Care Management Pharmacy  Name: Jenny [name]   MRN: [id number]  DOB: 08/22/1941  Chief Complaint/HPI Jenny [name],  79 y.o., female presents for their Follow-Up CCM visit with the clinical pharmacist via telephone due to COVID-19 Pandemic.  PCP : Midge Minium, MD Their chronic conditions include: HTN, Hypothyroidism, Anxiety and Depression, GERD  Feels fluid in ankles is worse in summer months however is resolved upon taking furosemide. Denies dizziness, SOB, chest pain. Has not been checking blood pressure at home.   Shingrix vaccine previously discussed, has not received but is still interested and plans to f/u on this.   States her mood and energy has been great. Walking occasionally out in the garden but limited by SOB. Has upcoming PFT 04/17/2020, concerned that there may be asthmatic trigger causing worsening symptoms lately. Patient did not receive symbicort PAP application in mail previously so is needing additional application today.  Office Visits: 04/01/2020 [facility]): shingrix discussion  11/06/2019 (PCP): Seen for fatigue, levothyroxine inc. From 100 to 112 mcg Consult Visits: 12/08/2019 ([facility], resp.): COPD exacerbation. Augmentin and Medrol prescribed.  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   Other fatigue 11/03/2015   Hyperglycemia 07/07/2015   Ceruminosis 03/07/2015   Left hip pain 04/26/2014   Acute pain of left knee 04/26/2014   Annual physical exam 08/06/2013   Atrophic vaginitis 06/17/2012   Peripheral vascular disease, unspecified ([place]) 06/02/2012   Urticaria 10/15/2011   Anemia 09/21/2011   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   Vitamin D deficiency 02/12/2011   Peripheral  neuropathy 02/12/2011   Intertrigo 01/21/2011   Asthma 01/14/2011   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   History of chicken pox 01/14/2011   History of measles 01/14/2011   HAND PAIN 04/09/2010   OTHER URINARY INCONTINENCE 03/12/2010   Depression with anxiety 03/06/2008   Essential hypertension 02/22/2008   Chronic bronchitis ([place]) 02/22/2008   DIVERTICULOSIS OF COLON 02/22/2008   Unilateral primary osteoarthritis, left knee 02/22/2008   Hypothyroidism 08/23/2007   Obesity 08/23/2007   Obstructive sleep apnea 08/23/2007   Venous (peripheral) insufficiency 08/23/2007   Allergic rhinitis 08/23/2007   GERD 08/23/2007   BACK PAIN, LUMBAR 08/23/2007   Past Surgical History:  Procedure Laterality Date   ABDOMINAL HYSTERECTOMY     took one ovary   AP repair and sling for cystocele  8-04    Dr Gaynelle Arabian   APPENDECTOMY     carpal tunel  2008   CHOLECYSTECTOMY     ctr     bilateral   EYE SURGERY     left hand  2010   left thumb, joint removal, CTR   NECK SURGERY     x 2 - discectomy, fusion   SHOULDER ARTHROSCOPY DISTAL CLAVICLE EXCISION AND OPEN ROTATOR CUFF REPAIR     right   TONSILLECTOMY AND ADENOIDECTOMY     TUBAL LIGATION     Social History   Socioeconomic History   Marital status: Widowed    Spouse name: Not on file   Number of children: Not on file   Years of education: Not on file   Highest education level: Not on file  Occupational History   Occupation: Retired  Tobacco Use   Smoking status: Former Smoker  Packs/day: 1.50    Years: 30.00    Pack years: 45.00    Types: Cigarettes    Quit date: 10/13/1991    Years since quitting: 28.8   Smokeless tobacco: Never Used  Vaping Use   Vaping Use: Never used  Substance and Sexual Activity   Alcohol use: No    Alcohol/week: 0.0 standard drinks   Drug use: No   Sexual activity: Never  Other Topics Concern   Not on file  Social History Narrative   Not on file    Social Determinants of Health   Financial Resource Strain: Medium Risk   Difficulty of Paying Living Expenses: Somewhat hard  Food Insecurity: No Food Insecurity   Worried About Charity fundraiser in the Last Year: Never true   Ran Out of Food in the Last Year: Never true  Transportation Needs: No Transportation Needs   Lack of Transportation (Medical): No   Lack of Transportation (Non-Medical): No  Physical Activity: Inactive   Days of Exercise per Week: 0 days   Minutes of Exercise per Session: 0 min  Stress: No Stress Concern Present   Feeling of Stress : Not at all  Social Connections: Moderately Integrated   Frequency of Communication with Friends and Family: More than three times a week   Frequency of Social Gatherings with Friends and Family: More than three times a week   Attends Religious Services: 1 to 4 times per year   Active Member of [employer] or Organizations: Yes   Attends Archivist Meetings: 1 to 4 times per year   Marital Status: Widowed   Family History  Problem Relation Age of Onset   COPD Mother    Pneumonia Mother    Other Mother        CHF   Hypertension Mother    Arthritis Mother        septic knee after replacement   Heart disease Mother        chf   Dementia Father    Arthritis Brother    Asthma Daughter    Colon polyps Daughter    Stroke Maternal Grandmother    Heart disease Maternal Grandmother    Other Maternal Grandfather        CHF   Heart disease Maternal Grandfather        chf   Cancer Paternal Grandmother        stomach?   Other Paternal Grandfather        problems with kidneys   Kidney disease Paternal Grandfather    Anxiety disorder Daughter    Cancer Maternal Aunt        breast   Cancer Maternal Aunt        liver   Colon cancer Neg Hx    Allergies  Allergen Reactions   Prednisone Other (See Comments)    Confusion, dizziness Medrol is ok   Codeine Nausea Only         Tramadol Itching   Outpatient Encounter Medications as of 08/22/2020  Medication Sig Note   albuterol (VENTOLIN HFA) 108 (90 Base) MCG/ACT inhaler Inhale 2 puffs into the lungs every 6 (six) hours as needed. For wheezing    ALPRAZolam (XANAX) 0.5 MG tablet TAKE 1/2 TO 1 TABLET BY MOUTH 3 TIMES DAILY AS NEEDED FOR ANXIETY    amLODipine (NORVASC) 5 MG tablet Take 1 tablet (5 mg total) by mouth daily.    benzonatate (TESSALON) 200 MG capsule Take 1 capsule (200 mg  total) by mouth 3 (three) times daily as needed for cough.    budesonide-formoterol (SYMBICORT) 160-4.5 MCG/ACT inhaler Inhale 2 puffs into the lungs 2 (two) times daily.    dupilumab (DUPIXENT) 300 MG/2ML prefilled syringe Inject 300 mg into the skin every 14 (fourteen) days.    escitalopram (LEXAPRO) 5 MG tablet TAKE 1 TABLET BY MOUTH EVERY DAY    fluticasone (FLONASE) 50 MCG/ACT nasal spray Place 1 spray into both nostrils daily.    furosemide (LASIX) 20 MG tablet Take 20 mg by mouth. Take 2 tablet daily as needed    guaifenesin (MUCUS RELIEF) 400 MG TABS tablet Take 400 mg by mouth 2 (two) times a day.    ibuprofen (ADVIL,MOTRIN) 200 MG tablet Take 400 mg by mouth every 6 (six) hours as needed for moderate pain.    levothyroxine (SYNTHROID) 112 MCG tablet TAKE 1 TABLET BY MOUTH EVERY DAY    meclizine (ANTIVERT) 25 MG tablet Take 1 tablet (25 mg total) by mouth 3 (three) times daily as needed for up to 30 doses for dizziness.    pantoprazole (PROTONIX) 40 MG tablet TAKE 1 TABLET BY MOUTH EVERY DAY 30 MINUTES BEFORE DINNER    Respiratory Therapy Supplies (FLUTTER) DEVI 1 Device by Does not apply route in the morning, at noon, and at bedtime.    [DISCONTINUED] esomeprazole (NEXIUM) 40 MG capsule Take 40 mg by mouth daily before breakfast.   12/18/2011: Dr Marlyce Huge put pt on Zantac   No facility-administered encounter medications on file as of 08/22/2020.   Patient Care Team    Relationship Specialty Notifications Start  End  Midge Minium, MD PCP - General Family Medicine  11/09/16   Noralee Space, MD Consulting Physician Pulmonary Disease  11/09/16   Ladene Artist, MD Consulting Physician Gastroenterology  11/09/16   Garrel Ridgel, [place] Consulting Physician Podiatry  06/03/17   Dian Queen, MD Consulting Physician Obstetrics and Gynecology  06/03/17   [name], [facility] Pharmacist Pharmacist  01/09/20    Comment: phone number [phone]   Current Diagnosis/Assessment:  Goals Addressed   None    Hypertension / Edema    Goal <140/90  Office blood pressures are  BP Readings from Last 3 Encounters:  06/19/20 120/70  05/07/20 (!) 159/86  04/28/20 (!) 151/73   BP currently at goal of <140/90 per recent office visits. Currently taking:   Amlodipine 5 mg daily   Furosemide 20 mg two tablets daily as needed for swelling in ankles  We discussed: home monitoring of BP.   Plan  Continue current medications.   COPD   Several exacerbations since December. Now has started using Duoneb again as 3 mL four times daily in addition to current maintenance regimen of Symbicort 160-4.5 two puffs into the lungs two times daily. Also has ventolin inhaler, which is used as 2 puffs every 6 hours as needed.   Needing PAP application for Symbicort.   Plan  Continue current medications and file for patient assistance.  Provided with PAP application. Patient to drop off with Dr. Halford Chessman at upcoming visit.   Anxiety and Depression   PHQ 2/9:0. States ongoing benefit from medications. Denies any side effects.   Patient is currently controlled on the following medications: escitalopram 5mg  daily, alprazolam 0.5 mg take 1/2 tablet by mouth three times daily as needed for anxiety.   Plan  Continue current medications.  Hypothyroidism   TSH  Date Value Ref Range Status  12/05/2019 3.66 0.35 - 4.50  uIU/mL Final   11/2019 TSH in normal range after increase from 100 to 112 mcg. No symptoms or side  effects reported.   Patient is currently controlled on the following medications:   Levothyroxine 112 mcg daily   Plan Continue current medications.  GERD   Denies any recent symptoms of acid reflux. Remains on PPI to r/o worsening COPD symptoms due to reflux.   Patient is currently controlled on the following medications:  Protonix 40mg  daily  Plan Continue current medications.  Vaccines   Immunization History  Administered Date(s) Administered   Fluad Quad(high Dose 65+) 08/02/2019   Influenza Split 08/20/2011, 09/30/2012   Influenza Whole 08/13/2013   Influenza, High Dose Seasonal PF 10/13/2014, 09/03/2015, 06/03/2017, 09/25/2018   Influenza-Unspecified 07/11/2016   Moderna SARS-COVID-2 Vaccination 11/17/2019, 12/24/2019   Pneumococcal Conjugate-13 03/16/2014   Pneumococcal Polysaccharide-23 06/23/2016   Tdap 09/22/2011   Zoster 03/16/2014   Reinforced shingrix recommendation with patient. Will be due for tdap 09/2021  Plan  Recommended patient receive Shingrix vaccine in office/pharmacy.   Medication Management   Receives prescription medications from:  [id number] - [place], [place] - [address] [place] [address] Phone: [phone] Fax: [place], [place] - [address] [place] [address] [place] [address] Phone: [phone] Fax: [phone]  [place], [place] [address] [place] [address] [place] [address] Phone: [phone] Fax: [phone]   Plan  Continue current medication management strategy.  Follow up: 3 month phone visit. Future Appointments  Date Time Provider [place]  08/22/2020  9:30 AM [facility] C[facility] PHARMACIST [facility] PEC  09/03/2020  2:00 PM [facility] INJECTION [facility] None  09/17/2020  2:00 PM [facility]  INJECTION [facility] None   COPD, Depression, HTN __________  [name], Pharm.D., BCGP Clinical Pharmacist [facility] [phone]

## 2020-08-22 NOTE — Chronic Care Management (AMB) (Signed)
°  Chronic Care Management   Outreach Note   Name: Jenny [name] MRN: [id number] DOB: 03/17/1941  Referred by: Midge Minium, MD Reason for referral: Telephone Appointment with [place] Pharmacist, [name].   An unsuccessful telephone outreach was attempted today. The patient was referred to the pharmacist for assistance with care management and care coordination.   Telephone appointment with clinical pharmacist today (08/22/2020) at 930am. If patient immediately returns call, transfer to [phone]. Otherwise, please provide this number so patient can reschedule visit.   [name], Pharm.D., BCGP Clinical Pharmacist [facility] [phone]

## 2020-08-23 ENCOUNTER — Other Ambulatory Visit: Payer: Self-pay | Admitting: Pulmonary Disease

## 2020-08-23 ENCOUNTER — Other Ambulatory Visit: Payer: Self-pay | Admitting: Family Medicine

## 2020-08-23 NOTE — Telephone Encounter (Signed)
Patient needs refill on the following  Xanax 5mg   90 tabs 0 refills  Last refill 07/28/2020

## 2020-09-03 ENCOUNTER — Ambulatory Visit (INDEPENDENT_AMBULATORY_CARE_PROVIDER_SITE_OTHER): Payer: Medicare HMO

## 2020-09-03 ENCOUNTER — Other Ambulatory Visit: Payer: Self-pay

## 2020-09-03 DIAGNOSIS — J4550 Severe persistent asthma, uncomplicated: Secondary | ICD-10-CM

## 2020-09-03 MED ORDER — DUPILUMAB 300 MG/2ML ~~LOC~~ SOSY
300.0000 mg | PREFILLED_SYRINGE | Freq: Once | SUBCUTANEOUS | Status: AC
  Administered 2020-09-03: 300 mg via SUBCUTANEOUS

## 2020-09-03 NOTE — Progress Notes (Signed)
Have you been hospitalized within the last 10 days?  No Do you have a fever?  No Do you have a cough?  No Do you have a headache or sore throat? No Do you have your Epi Pen visible and is it within date?  Yes 

## 2020-09-09 ENCOUNTER — Telehealth: Payer: Self-pay | Admitting: Pulmonary Disease

## 2020-09-09 NOTE — Telephone Encounter (Signed)
Dupixent Order: 300mg  #2 prefilled syringe Ordered Date: 09/09/20 Expected date of arrival: 09/12/20 Ordered by: [place]: [name]

## 2020-09-11 ENCOUNTER — Telehealth: Payer: Self-pay

## 2020-09-11 DIAGNOSIS — J1282 Pneumonia due to coronavirus disease 2019: Secondary | ICD-10-CM

## 2020-09-11 DIAGNOSIS — U071 COVID-19: Secondary | ICD-10-CM

## 2020-09-11 HISTORY — DX: COVID-19: U07.1

## 2020-09-11 HISTORY — DX: Pneumonia due to coronavirus disease 2019: J12.82

## 2020-09-11 NOTE — Progress Notes (Signed)
Called and spoke with patient about rescheduling her follow up appointment with rph.   Rescheduled patients appointment for January 7th, 2022 at 10 am.  Georgiana Shore ,[place] Pharmacist Assistant [phone]

## 2020-09-12 NOTE — Telephone Encounter (Signed)
Dupixent Shipment Received: 300mg  #2 prefilled syringe Medication arrival date: 09/12/20 Lot #: [id number] Exp date: 01/10/2023 Received by: Elliot Dally

## 2020-09-13 ENCOUNTER — Other Ambulatory Visit: Payer: Self-pay | Admitting: Family Medicine

## 2020-09-16 ENCOUNTER — Other Ambulatory Visit: Payer: Self-pay | Admitting: *Deleted

## 2020-09-16 MED ORDER — DUPIXENT 300 MG/2ML ~~LOC~~ SOSY: 300.0000 mg | PREFILLED_SYRINGE | SUBCUTANEOUS | 11 refills | Status: DC

## 2020-09-17 ENCOUNTER — Other Ambulatory Visit: Payer: Self-pay

## 2020-09-17 ENCOUNTER — Ambulatory Visit (INDEPENDENT_AMBULATORY_CARE_PROVIDER_SITE_OTHER): Payer: Medicare HMO

## 2020-09-17 DIAGNOSIS — J4550 Severe persistent asthma, uncomplicated: Secondary | ICD-10-CM

## 2020-09-17 MED ORDER — DUPILUMAB 300 MG/2ML ~~LOC~~ SOSY
300.0000 mg | PREFILLED_SYRINGE | Freq: Once | SUBCUTANEOUS | Status: AC
  Administered 2020-09-17: 300 mg via SUBCUTANEOUS

## 2020-09-17 NOTE — Progress Notes (Signed)
Have you been hospitalized within the last 10 days?  No Do you have a fever?  No Do you have a cough?  No Do you have a headache or sore throat? No Do you have your Epi Pen visible and is it within date?  Yes 

## 2020-09-19 ENCOUNTER — Telehealth: Payer: Self-pay | Admitting: Orthopedic Surgery

## 2020-09-19 ENCOUNTER — Ambulatory Visit (INDEPENDENT_AMBULATORY_CARE_PROVIDER_SITE_OTHER): Payer: Medicare HMO

## 2020-09-19 ENCOUNTER — Ambulatory Visit: Payer: Medicare HMO | Admitting: Orthopedic Surgery

## 2020-09-19 DIAGNOSIS — M545 Low back pain, unspecified: Secondary | ICD-10-CM | POA: Diagnosis not present

## 2020-09-19 DIAGNOSIS — M48061 Spinal stenosis, lumbar region without neurogenic claudication: Secondary | ICD-10-CM

## 2020-09-19 DIAGNOSIS — M25562 Pain in left knee: Secondary | ICD-10-CM

## 2020-09-19 DIAGNOSIS — M1712 Unilateral primary osteoarthritis, left knee: Secondary | ICD-10-CM | POA: Diagnosis not present

## 2020-09-19 NOTE — Telephone Encounter (Signed)
Need pre-approval for left knee gel injection

## 2020-09-19 NOTE — Telephone Encounter (Signed)
Noted.  Will submit after 10/15/2020.

## 2020-09-22 ENCOUNTER — Encounter: Payer: Self-pay | Admitting: Orthopedic Surgery

## 2020-09-22 NOTE — Progress Notes (Signed)
Office Visit Note   Patient: Jenny [name]           Date of Birth: 05/22/1941           MRN: [id number] Visit Date: 09/19/2020 Requested by: Midge Minium, MD [address] [place],  [place] [address] PCP: Midge Minium, MD  Subjective: Chief Complaint  Patient presents with  . Left Knee - Pain  . Lower Back - Pain    HPI: Jenny [name] is a 79 y.o. female who presents to the office complaining of left knee pain and back pain. Patient notes constant left knee pain that makes it difficult to go up and down stairs. Localizes the majority of her pain to the medial aspect of the knee. Denies any mechanical symptoms such as locking or popping. No groin pain. She has had a cortisone injection in her knee before in the past that has provided good relief.  She also complains of increased back pain. She has radicular pain down her leg into her buttocks on both her right and left side. She has history of 1 prior lumbar spine surgery in 2012. She notes worsening back pain. She denies any new bowel or bladder incontinence or any saddle anesthesia. She had her last CT myelogram in 2018 that showed successful fusion with adjacent level disease at L3-L4 with bilateral severe foraminal stenosis as well as moderate right foraminal stenosis at L2-L3. She has had a prior epidural steroid injection into her lumbar spine that provided excellent relief years ago by Dr. Sherwood Gambler. This ESI was for back pain only at that time..                ROS: All systems reviewed are negative as they relate to the chief complaint within the history of present illness.  Patient denies fevers or chills.  Assessment & Plan: Visit Diagnoses:  1. Unilateral primary osteoarthritis, left knee   2. Left knee pain, unspecified chronicity   3. Low back pain, unspecified back pain laterality, unspecified chronicity, unspecified whether sciatica present   4. Foraminal stenosis of lumbar region     Plan:  Patient is a 79 year old female who presents complaining of left knee pain and low back pain. She has history of left knee cortisone injection that provided good relief in the past. She also has history of lumbar spine surgery in the past and back pain that has been worsening. Last CT myelogram was in 2018 that showed severe foraminal stenosis bilaterally at L3-L4. Her main complaint today is the left knee pain. Left knee cortisone injection administered today and patient tolerated the procedure well. Plan to preapproved patient for gel injection. Offered to refer patient to Dr. Ernestina Patches for lumbar spine ESI but patient wants to hold off for now and will call the office when she is ready to schedule back injection.  This patient is diagnosed with osteoarthritis of the knee(s).    Radiographs show evidence of joint space narrowing, osteophytes, subchondral sclerosis and/or subchondral cysts.  This patient has knee pain which interferes with functional and activities of daily living.    This patient has experienced inadequate response, adverse effects and/or intolerance with conservative treatments such as acetaminophen, NSAIDS, topical creams, physical therapy or regular exercise, knee bracing and/or weight loss.   This patient has experienced inadequate response or has a contraindication to intra articular steroid injections for at least 3 months.   This patient is not scheduled to have a total  knee replacement within 6 months of starting treatment with viscosupplementation.   Follow-Up Instructions: No follow-ups on file.   Orders:  Orders Placed This Encounter  Procedures  . XR Knee 1-2 Views Left  . XR Lumbar Spine 2-3 Views   No orders of the defined types were placed in this encounter.     Procedures: Large Joint Inj: L knee on 09/23/2020 8:13 AM Indications: diagnostic evaluation, joint swelling and pain Details: 18 G 1.5 in needle, superolateral approach  Arthrogram:  No  Medications: 5 mL lidocaine 1 %; 40 mg methylPREDNISolone acetate 40 MG/ML; 4 mL bupivacaine 0.25 % Outcome: tolerated well, no immediate complications Procedure, treatment alternatives, risks and benefits explained, specific risks discussed. Consent was given by the patient. Immediately prior to procedure a time out was called to verify the correct patient, procedure, equipment, support staff and site/side marked as required. Patient was prepped and draped in the usual sterile fashion.       Clinical Data: No additional findings.  Objective: Vital Signs: There were no vitals taken for this visit.  Physical Exam:  Constitutional: Patient appears well-developed HEENT:  Head: Normocephalic Eyes:EOM are normal Neck: Normal range of motion Cardiovascular: Normal rate Pulmonary/chest: Effort normal Neurologic: Patient is alert Skin: Skin is warm Psychiatric: Patient has normal mood and affect  Ortho Exam: Ortho exam demonstrates tenderness throughout the axial lumbar spine. 5/5 motor strength of the bilateral hip flexors, quadricep, hamstring, dorsiflexion, plantarflexion. Sensation intact through all dermatomes of bilateral lower extremities. Negative straight leg raise. No tenderness over the greater trochanter. No pain with hip range of motion. Tenderness over the medial lateral joint lines of the left knee. Extensor mechanism intact with patient able to perform straight leg raise. No significant effusion present.  Specialty Comments:  No specialty comments available.  Imaging: No results found.   PMFS History: Patient Active Problem List   Diagnosis Date Noted  . Abnormal CT of the chest 04/18/2020  . Cough 07/06/2018  . Peripheral edema 02/22/2017  . Localized edema 12/17/2016  . Colon cancer screening 06/15/2016  . 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 Other fatigue 11/03/2015  . Hyperglycemia 07/07/2015  . Ceruminosis 03/07/2015  . Left hip pain 04/26/2014  .  Acute pain of left knee 04/26/2014  . Annual physical exam 08/06/2013  . Atrophic vaginitis 06/17/2012  . Peripheral vascular disease, unspecified ([place]) 06/02/2012  . Urticaria 10/15/2011  . Anemia 09/21/2011  . 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  . Vitamin D deficiency 02/12/2011  . Peripheral neuropathy 02/12/2011  . Intertrigo 01/21/2011  . Asthma 01/14/2011  . 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 History of chicken pox 01/14/2011  . History of measles 01/14/2011  . HAND PAIN 04/09/2010  . OTHER URINARY INCONTINENCE 03/12/2010  . Depression with anxiety 03/06/2008  . Essential hypertension 02/22/2008  . Chronic bronchitis ([place]) 02/22/2008  . DIVERTICULOSIS OF COLON 02/22/2008  . Unilateral primary osteoarthritis, left knee 02/22/2008  . Hypothyroidism 08/23/2007  . Obesity 08/23/2007  . Obstructive sleep apnea 08/23/2007  . Venous (peripheral) insufficiency 08/23/2007  . Allergic rhinitis 08/23/2007  . GERD 08/23/2007  . BACK PAIN, LUMBAR 08/23/2007   Past Medical History:  Diagnosis Date  . ALLERGIC RHINITIS 08/23/2007  . Allergy    rhinitis  . 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 Anemia 09/21/2011  . Annual physical exam 08/06/2013  . Asthma    mild, intermittent  . Asthma 01/14/2011  . Atrophic vaginitis 06/17/2012  . BACK PAIN, LUMBAR 08/23/2007  .  Bronchitis   . BRONCHITIS, RECURRENT 02/22/2008  . Cough    usually not productive  . Degenerative joint disease   . DEGENERATIVE JOINT DISEASE 02/22/2008  . Depression with anxiety 03/06/2008   Qualifier: Diagnosis of  By: Lenna Gilford MD, Deborra Medina   . Diverticulosis of colon   . DIVERTICULOSIS OF COLON 02/22/2008  . Dysthymia   . 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  . elevated glucose 08/21/2008  . GERD 08/23/2007  . GERD (gastroesophageal reflux disease)   . HAND PAIN 04/09/2010  . History of chicken pox 01/14/2011  . History of measles 01/14/2011  . Hyperglycemia 07/07/2015  . Hypertension   . HYPERTENSION 02/22/2008  .  Hypothyroidism   . HYPOTHYROIDISM 08/23/2007  . Incontinence of urine   . Lumbar back pain   . Obesity   . OBESITY 08/23/2007  . Obstructive sleep apnea    does not use CPAP, lost weight  . Other urinary incontinence 03/12/2010  . Peripheral neuropathy 02/12/2011   possibly related to spondylolisthesis per pt  . PONV (postoperative nausea and vomiting)   . Sleep apnea   . SLEEP APNEA, OBSTRUCTIVE 08/23/2007  . Transient memory loss March 2006   after a stressful event she had no memory the rest of that day  . Urinary frequency   . Urinary incontinence   . UTI (lower urinary tract infection) 03/23/2012  . Venous insufficiency   . VENOUS INSUFFICIENCY 08/23/2007  . Vitamin D deficiency   . VITAMIN D DEFICIENCY 08/21/2008    Family History  Problem Relation Age of Onset  . COPD Mother   . Pneumonia Mother   . Other Mother        CHF  . Hypertension Mother   . Arthritis Mother        septic knee after replacement  . Heart disease Mother        chf  . Dementia Father   . Arthritis Brother   . Asthma Daughter   . Colon polyps Daughter   . Stroke Maternal Grandmother   . Heart disease Maternal Grandmother   . Other Maternal Grandfather        CHF  . Heart disease Maternal Grandfather        chf  . Cancer Paternal Grandmother        stomach?  . Other Paternal Grandfather        problems with kidneys  . Kidney disease Paternal Grandfather   . Anxiety disorder Daughter   . Cancer Maternal Aunt        breast  . Cancer Maternal Aunt        liver  . Colon cancer Neg Hx     Past Surgical History:  Procedure Laterality Date  . ABDOMINAL HYSTERECTOMY     took one ovary  . AP repair and sling for cystocele  8-04    Dr Gaynelle Arabian  . APPENDECTOMY    . carpal tunel  2008  . CHOLECYSTECTOMY    . ctr     bilateral  . EYE SURGERY    . left hand  2010   left thumb, joint removal, CTR  . NECK SURGERY     x 2 - discectomy, fusion  . SHOULDER ARTHROSCOPY DISTAL CLAVICLE  EXCISION AND OPEN ROTATOR CUFF REPAIR     right  . TONSILLECTOMY AND ADENOIDECTOMY    . TUBAL LIGATION     Social History   Occupational History  . Occupation: Retired  Tobacco Use  . Smoking status: Former Smoker  Packs/day: 1.50    Years: 30.00    Pack years: 45.00    Types: Cigarettes    Quit date: 10/13/1991    Years since quitting: 28.9  . Smokeless tobacco: Never Used  Vaping Use  . Vaping Use: Never used  Substance and Sexual Activity  . Alcohol use: No    Alcohol/week: 0.0 standard drinks  . Drug use: No  . Sexual activity: Never

## 2020-09-23 ENCOUNTER — Telehealth: Payer: Self-pay | Admitting: Family Medicine

## 2020-09-23 ENCOUNTER — Encounter: Payer: Self-pay | Admitting: Orthopedic Surgery

## 2020-09-23 DIAGNOSIS — M1712 Unilateral primary osteoarthritis, left knee: Secondary | ICD-10-CM | POA: Diagnosis not present

## 2020-09-23 DIAGNOSIS — M545 Low back pain, unspecified: Secondary | ICD-10-CM | POA: Diagnosis not present

## 2020-09-23 DIAGNOSIS — M48061 Spinal stenosis, lumbar region without neurogenic claudication: Secondary | ICD-10-CM | POA: Diagnosis not present

## 2020-09-23 MED ORDER — LIDOCAINE HCL 1 % IJ SOLN
5.0000 mL | INTRAMUSCULAR | Status: AC | PRN
  Administered 2020-09-23: 08:00:00 5 mL

## 2020-09-23 MED ORDER — BUPIVACAINE HCL 0.25 % IJ SOLN
4.0000 mL | INTRAMUSCULAR | Status: AC | PRN
  Administered 2020-09-23: 08:00:00 4 mL via INTRA_ARTICULAR

## 2020-09-23 MED ORDER — ALPRAZOLAM 0.5 MG PO TABS
ORAL_TABLET | ORAL | 0 refills | Status: DC
Start: 2020-09-23 — End: 2020-11-19

## 2020-09-23 MED ORDER — METHYLPREDNISOLONE ACETATE 40 MG/ML IJ SUSP
40.0000 mg | INTRAMUSCULAR | Status: AC | PRN
  Administered 2020-09-23: 08:00:00 40 mg via INTRA_ARTICULAR

## 2020-09-23 NOTE — Telephone Encounter (Signed)
Prescription filled.

## 2020-09-23 NOTE — Telephone Encounter (Signed)
Pt has an appt with Dr. [name] on 12/15 but is out of her Xanax, [facility] [place], please advise

## 2020-09-25 ENCOUNTER — Telehealth (INDEPENDENT_AMBULATORY_CARE_PROVIDER_SITE_OTHER): Payer: Medicare HMO | Admitting: Family Medicine

## 2020-09-25 ENCOUNTER — Encounter: Payer: Self-pay | Admitting: Family Medicine

## 2020-09-25 DIAGNOSIS — J45909 Unspecified asthma, uncomplicated: Secondary | ICD-10-CM | POA: Insufficient documentation

## 2020-09-25 DIAGNOSIS — I10 Essential (primary) hypertension: Secondary | ICD-10-CM | POA: Diagnosis not present

## 2020-09-25 DIAGNOSIS — R69 Illness, unspecified: Secondary | ICD-10-CM | POA: Diagnosis not present

## 2020-09-25 DIAGNOSIS — F418 Other specified anxiety disorders: Secondary | ICD-10-CM | POA: Diagnosis not present

## 2020-09-25 DIAGNOSIS — E039 Hypothyroidism, unspecified: Secondary | ICD-10-CM | POA: Diagnosis not present

## 2020-09-25 NOTE — Progress Notes (Signed)
Virtual Visit via Video   I connected with patient on 09/25/20 at 10:30 AM EST by a video enabled telemedicine application and verified that I am speaking with the correct person using two identifiers.  Location patient: Home Location provider: [facility] Persons participating in the virtual visit: Patient, Provider, Luna (Sabrina M)  I discussed the limitations of evaluation and management by telemedicine and the availability of in person appointments. The patient expressed understanding and agreed to proceed.  Subjective:   HPI:   Hypothyroid- chronic problem, on Levothyroxine 123mcg daily.  Pt reports she has been low energy recently but admits she has not been using CPAP or taking Vit D.  Energy improves when using CPAP.  HTN- chronic problem, on Amlodipine 5mg  daily.  Pt not able to check BP today.  No CP, SOB, HAs, visual changes, edema.  Depression/Anxiety- currently on Lexapro 5mg  daily and Alprazolam prn.  Pt feels medication has been very helpful.  ROS:   See pertinent positives and negatives per HPI.  Patient Active Problem List   Diagnosis Date Noted  . 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 Abnormal CT of the chest 04/18/2020  . Cough 07/06/2018  . Peripheral edema 02/22/2017  . Localized edema 12/17/2016  . Colon cancer screening 06/15/2016  . 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 Other fatigue 11/03/2015  . Hyperglycemia 07/07/2015  . Ceruminosis 03/07/2015  . Left hip pain 04/26/2014  . Acute pain of left knee 04/26/2014  . Annual physical exam 08/06/2013  . Atrophic vaginitis 06/17/2012  . Peripheral vascular disease, unspecified ([place]) 06/02/2012  . Urticaria 10/15/2011  . Anemia 09/21/2011  . 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  . Vitamin D deficiency 02/12/2011  . Peripheral neuropathy 02/12/2011  . Intertrigo 01/21/2011  . Asthma 01/14/2011  . 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 History of chicken  pox 01/14/2011  . History of measles 01/14/2011  . HAND PAIN 04/09/2010  . OTHER URINARY INCONTINENCE 03/12/2010  . Depression with anxiety 03/06/2008  . Essential hypertension 02/22/2008  . Chronic bronchitis ([place]) 02/22/2008  . DIVERTICULOSIS OF COLON 02/22/2008  . Unilateral primary osteoarthritis, left knee 02/22/2008  . Hypothyroidism 08/23/2007  . Obesity 08/23/2007  . Obstructive sleep apnea 08/23/2007  . Venous (peripheral) insufficiency 08/23/2007  . Allergic rhinitis 08/23/2007  . GERD 08/23/2007  . BACK PAIN, LUMBAR 08/23/2007    Social History   Tobacco Use  . Smoking status: Former Smoker    Packs/day: 1.50    Years: 30.00    Pack years: 45.00    Types: Cigarettes    Quit date: 10/13/1991    Years since quitting: 28.9  . Smokeless tobacco: Never Used  Substance Use Topics  . Alcohol use: No    Alcohol/week: 0.0 standard drinks    Current Outpatient Medications:  .  albuterol (VENTOLIN HFA) 108 (90 Base) MCG/ACT inhaler, Inhale 2 puffs into the lungs every 6 (six) hours as needed. For wheezing, Disp: 6.7 g, Rfl: 5 .  ALPRAZolam (XANAX) 0.5 MG tablet, TAKE 1/2 TO 1 TABLET BY MOUTH 3 TIMES DAILY AS NEEDED FOR ANXIETY- Last refill without appt, Disp: 90 tablet, Rfl: 0 .  amLODipine (NORVASC) 5 MG tablet, Take 1 tablet (5 mg total) by mouth daily., Disp: 90 tablet, Rfl: 0 .  benzonatate (TESSALON) 200 MG capsule, Take 1 capsule (200 mg total) by mouth 3 (three) times daily as needed for cough. (Patient taking differently: Take 200 mg by mouth 3 (three) times daily  as needed for cough. Only as needed), Disp: 45 capsule, Rfl: 3 .  budesonide-formoterol (SYMBICORT) 160-4.5 MCG/ACT inhaler, Inhale 2 puffs into the lungs 2 (two) times daily., Disp: 30.6 g, Rfl: 3 .  cetirizine (ZYRTEC) 10 MG tablet, 1 tablet, Disp: , Rfl:  .  dupilumab (DUPIXENT) 300 MG/2ML prefilled syringe, Inject 300 mg into the skin every 14 (fourteen) days., Disp: 4 mL, Rfl: 11 .  escitalopram (LEXAPRO)  5 MG tablet, TAKE 1 TABLET BY MOUTH EVERY DAY, Disp: 90 tablet, Rfl: 0 .  fluticasone (FLONASE) 50 MCG/ACT nasal spray, PLACE 1 SPRAY INTO BOTH NOSTRILS DAILY., Disp: 48 mL, Rfl: 1 .  furosemide (LASIX) 20 MG tablet, Take 20 mg by mouth. Take 2 tablet daily as needed, Disp: , Rfl:  .  guaifenesin (HUMIBID E) 400 MG TABS tablet, Take 400 mg by mouth 2 (two) times a day., Disp: , Rfl:  .  ibuprofen (ADVIL,MOTRIN) 200 MG tablet, Take 400 mg by mouth every 6 (six) hours as needed for moderate pain., Disp: , Rfl:  .  levothyroxine (SYNTHROID) 112 MCG tablet, TAKE 1 TABLET BY MOUTH EVERY DAY, Disp: 90 tablet, Rfl: 1 .  meclizine (ANTIVERT) 25 MG tablet, Take 1 tablet (25 mg total) by mouth 3 (three) times daily as needed for up to 30 doses for dizziness., Disp: 30 tablet, Rfl: 0 .  pantoprazole (PROTONIX) 40 MG tablet, TAKE 1 TABLET BY MOUTH EVERY DAY 30 MINUTES BEFORE DINNER, Disp: 90 tablet, Rfl: 1 .  Respiratory Therapy Supplies (FLUTTER) DEVI, 1 Device by Does not apply route in the morning, at noon, and at bedtime. (Patient not taking: Reported on 09/25/2020), Disp: 1 each, Rfl: 0  Allergies  Allergen Reactions  . Prednisone Other (See Comments)    Confusion, dizziness Medrol is ok  . Codeine Nausea Only       . Tramadol Itching    Objective:   There were no vitals taken for this visit.  AAOx3, NAD NCAT, EOMI No obvious CN deficits Coloring WNL Pt is able to speak clearly, coherently without shortness of breath or increased work of breathing.  Thought process is linear.  Mood is appropriate.   Assessment and Plan:   HTN- chronic problem.  Not able to check BP today but currently asymptomatic on Amlodipine 5mg  daily.  Will check BP when she comes for labs.  Pt expressed understanding and is in agreement w/ plan.   Hypothyroid- chronic problem.  Pt reports fatigue but this is likely multifactorial considering she hasn't been taking her Vit D or using her CPAP.  Check labs.  Adjust  meds prn   Depression- ongoing issue.  Pt feels sxs are currently well controlled on the Lexapro 5mg  daily.  No med changes at this time.  Will follow.  [name], MD 09/25/2020

## 2020-09-25 NOTE — Progress Notes (Signed)
I connected with  Jenny [name] on 09/25/20 by a video enabled telemedicine application and verified that I am speaking with the correct person using two identifiers.   I discussed the limitations of evaluation and management by telemedicine. The patient expressed understanding and agreed to proceed.

## 2020-09-29 ENCOUNTER — Other Ambulatory Visit: Payer: Self-pay

## 2020-09-29 ENCOUNTER — Inpatient Hospital Stay (HOSPITAL_COMMUNITY)
Admission: EM | Admit: 2020-09-29 | Discharge: 2020-10-04 | DRG: 177 | Disposition: A | Discharge status: Home / Self Care | Payer: Medicare HMO | Attending: Internal Medicine | Admitting: Internal Medicine

## 2020-09-29 ENCOUNTER — Encounter (HOSPITAL_COMMUNITY): Payer: Self-pay

## 2020-09-29 ENCOUNTER — Emergency Department (HOSPITAL_COMMUNITY): Payer: Medicare HMO

## 2020-09-29 DIAGNOSIS — Z8249 Family history of ischemic heart disease and other diseases of the circulatory system: Secondary | ICD-10-CM

## 2020-09-29 DIAGNOSIS — R059 Cough, unspecified: Secondary | ICD-10-CM

## 2020-09-29 DIAGNOSIS — R739 Hyperglycemia, unspecified: Secondary | ICD-10-CM | POA: Diagnosis present

## 2020-09-29 DIAGNOSIS — D72829 Elevated white blood cell count, unspecified: Secondary | ICD-10-CM | POA: Diagnosis present

## 2020-09-29 DIAGNOSIS — E039 Hypothyroidism, unspecified: Secondary | ICD-10-CM | POA: Diagnosis present

## 2020-09-29 DIAGNOSIS — Z7951 Long term (current) use of inhaled steroids: Secondary | ICD-10-CM

## 2020-09-29 DIAGNOSIS — E669 Obesity, unspecified: Secondary | ICD-10-CM | POA: Diagnosis present

## 2020-09-29 DIAGNOSIS — J1282 Pneumonia due to coronavirus disease 2019: Secondary | ICD-10-CM | POA: Diagnosis present

## 2020-09-29 DIAGNOSIS — R42 Dizziness and giddiness: Secondary | ICD-10-CM | POA: Diagnosis not present

## 2020-09-29 DIAGNOSIS — Z8261 Family history of arthritis: Secondary | ICD-10-CM

## 2020-09-29 DIAGNOSIS — R7989 Other specified abnormal findings of blood chemistry: Secondary | ICD-10-CM | POA: Diagnosis present

## 2020-09-29 DIAGNOSIS — Z87891 Personal history of nicotine dependence: Secondary | ICD-10-CM

## 2020-09-29 DIAGNOSIS — Z79890 Hormone replacement therapy (postmenopausal): Secondary | ICD-10-CM

## 2020-09-29 DIAGNOSIS — R350 Frequency of micturition: Secondary | ICD-10-CM | POA: Diagnosis present

## 2020-09-29 DIAGNOSIS — Z9989 Dependence on other enabling machines and devices: Secondary | ICD-10-CM

## 2020-09-29 DIAGNOSIS — I10 Essential (primary) hypertension: Secondary | ICD-10-CM | POA: Diagnosis present

## 2020-09-29 DIAGNOSIS — G4733 Obstructive sleep apnea (adult) (pediatric): Secondary | ICD-10-CM | POA: Diagnosis present

## 2020-09-29 DIAGNOSIS — R531 Weakness: Secondary | ICD-10-CM | POA: Diagnosis not present

## 2020-09-29 DIAGNOSIS — K219 Gastro-esophageal reflux disease without esophagitis: Secondary | ICD-10-CM | POA: Diagnosis present

## 2020-09-29 DIAGNOSIS — J8283 Eosinophilic asthma: Secondary | ICD-10-CM | POA: Diagnosis present

## 2020-09-29 DIAGNOSIS — U071 COVID-19: Secondary | ICD-10-CM | POA: Diagnosis not present

## 2020-09-29 DIAGNOSIS — R509 Fever, unspecified: Secondary | ICD-10-CM | POA: Diagnosis not present

## 2020-09-29 DIAGNOSIS — J8489 Other specified interstitial pulmonary diseases: Secondary | ICD-10-CM | POA: Diagnosis present

## 2020-09-29 DIAGNOSIS — Z825 Family history of asthma and other chronic lower respiratory diseases: Secondary | ICD-10-CM

## 2020-09-29 DIAGNOSIS — F32A Depression, unspecified: Secondary | ICD-10-CM | POA: Diagnosis present

## 2020-09-29 DIAGNOSIS — Z6835 Body mass index (BMI) 35.0-35.9, adult: Secondary | ICD-10-CM

## 2020-09-29 DIAGNOSIS — Z79899 Other long term (current) drug therapy: Secondary | ICD-10-CM

## 2020-09-29 DIAGNOSIS — R0902 Hypoxemia: Secondary | ICD-10-CM | POA: Diagnosis present

## 2020-09-29 MED ORDER — ALBUTEROL SULFATE HFA 108 (90 BASE) MCG/ACT IN AERS
4.0000 | INHALATION_SPRAY | Freq: Once | RESPIRATORY_TRACT | Status: AC
  Administered 2020-09-29: 23:00:00 4 via RESPIRATORY_TRACT
  Filled 2020-09-29: qty 6.7

## 2020-09-29 MED ORDER — METHYLPREDNISOLONE SODIUM SUCC 125 MG IJ SOLR
125.0000 mg | Freq: Once | INTRAMUSCULAR | Status: AC
  Administered 2020-09-29: 23:00:00 125 mg via INTRAVENOUS
  Filled 2020-09-29: qty 2

## 2020-09-29 MED ORDER — ACETAMINOPHEN 325 MG PO TABS
650.0000 mg | ORAL_TABLET | Freq: Once | ORAL | Status: AC
  Administered 2020-09-29: 23:00:00 650 mg via ORAL
  Filled 2020-09-29: qty 2

## 2020-09-29 NOTE — ED Triage Notes (Signed)
Pt reports weakness, dizziness, fever, sore throat, and urinary frequency since yesterday. Pt denies pain.

## 2020-09-29 NOTE — ED Provider Notes (Signed)
[facility] DEPT Provider Note   CSN: [id number] Arrival date & time: 09/29/20  2118     History Chief Complaint  Patient presents with  . Weakness  . Sore Throat    Jenny [name] is a 79 y.o. female.  HPI Patient brought here by family members because she began feeling ill today while they were at a family Christmas party. They noticed that her face was flushed and her temperature was checked and found to be 100.3. At that point she decided to go home. Later on her daughter went to visit her at her home around 5 PM. Then at seven they decided to bring her to the hospital because she had a temperature of 101.3. At that point she took some ibuprofen. Patient has complained of sore throat and urinary frequency today. She has been generally weak and tired. She normally lives alone and is fairly active. She has not been coughing. She has had two Covid vaccines, but no booster. No known sick contacts. There are no other known modifying factors.    Past Medical History:  Diagnosis Date  . ALLERGIC RHINITIS 08/23/2007  . Allergy    rhinitis  . 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 Anemia 09/21/2011  . Annual physical exam 08/06/2013  . Asthma    mild, intermittent  . Asthma 01/14/2011  . Atrophic vaginitis 06/17/2012  . BACK PAIN, LUMBAR 08/23/2007  . Bronchitis   . BRONCHITIS, RECURRENT 02/22/2008  . Cough    usually not productive  . Degenerative joint disease   . DEGENERATIVE JOINT DISEASE 02/22/2008  . Depression with anxiety 03/06/2008   Qualifier: Diagnosis of  By: Lenna Gilford MD, Deborra Medina   . Diverticulosis of colon   . DIVERTICULOSIS OF COLON 02/22/2008  . Dysthymia   . 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  . elevated glucose 08/21/2008  . GERD 08/23/2007  . GERD (gastroesophageal reflux disease)   . HAND PAIN 04/09/2010  . History of chicken pox 01/14/2011  . History of measles 01/14/2011  . Hyperglycemia 07/07/2015  . Hypertension   . HYPERTENSION 02/22/2008  .  Hypothyroidism   . HYPOTHYROIDISM 08/23/2007  . Incontinence of urine   . Lumbar back pain   . Obesity   . OBESITY 08/23/2007  . Obstructive sleep apnea    does not use CPAP, lost weight  . Other urinary incontinence 03/12/2010  . Peripheral neuropathy 02/12/2011   possibly related to spondylolisthesis per pt  . PONV (postoperative nausea and vomiting)   . Sleep apnea   . SLEEP APNEA, OBSTRUCTIVE 08/23/2007  . Transient memory loss March 2006   after a stressful event she had no memory the rest of that day  . Urinary frequency   . Urinary incontinence   . UTI (lower urinary tract infection) 03/23/2012  . Venous insufficiency   . VENOUS INSUFFICIENCY 08/23/2007  . Vitamin D deficiency   . VITAMIN D DEFICIENCY 08/21/2008    Patient Active Problem List   Diagnosis Date Noted  . 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 Abnormal CT of the chest 04/18/2020  . Cough 07/06/2018  . Peripheral edema 02/22/2017  . Localized edema 12/17/2016  . Colon cancer screening 06/15/2016  . 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 Hyperglycemia 07/07/2015  . Ceruminosis 03/07/2015  . Left hip pain 04/26/2014  . Acute pain of left knee 04/26/2014  . Annual physical exam 08/06/2013  . Atrophic vaginitis 06/17/2012  . Peripheral vascular disease, unspecified ([place]) 06/02/2012  . Urticaria  10/15/2011  . Anemia 09/21/2011  . 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  . Vitamin D deficiency 02/12/2011  . Peripheral neuropathy 02/12/2011  . Intertrigo 01/21/2011  . Asthma 01/14/2011  . 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 History of chicken pox 01/14/2011  . History of measles 01/14/2011  . HAND PAIN 04/09/2010  . OTHER URINARY INCONTINENCE 03/12/2010  . Depression with anxiety 03/06/2008  . Essential hypertension 02/22/2008  . Chronic bronchitis ([place]) 02/22/2008  . DIVERTICULOSIS OF COLON 02/22/2008  . Unilateral primary osteoarthritis, left knee 02/22/2008  . Hypothyroidism  08/23/2007  . Obesity 08/23/2007  . Obstructive sleep apnea 08/23/2007  . Venous (peripheral) insufficiency 08/23/2007  . Allergic rhinitis 08/23/2007  . GERD 08/23/2007  . BACK PAIN, LUMBAR 08/23/2007    Past Surgical History:  Procedure Laterality Date  . ABDOMINAL HYSTERECTOMY     took one ovary  . AP repair and sling for cystocele  8-04    Dr Gaynelle Arabian  . APPENDECTOMY    . carpal tunel  2008  . CHOLECYSTECTOMY    . ctr     bilateral  . EYE SURGERY    . left hand  2010   left thumb, joint removal, CTR  . NECK SURGERY     x 2 - discectomy, fusion  . SHOULDER ARTHROSCOPY DISTAL CLAVICLE EXCISION AND OPEN ROTATOR CUFF REPAIR     right  . TONSILLECTOMY AND ADENOIDECTOMY    . TUBAL LIGATION       OB History   No obstetric history on file.     Family History  Problem Relation Age of Onset  . COPD Mother   . Pneumonia Mother   . Other Mother        CHF  . Hypertension Mother   . Arthritis Mother        septic knee after replacement  . Heart disease Mother        chf  . Dementia Father   . Arthritis Brother   . Asthma Daughter   . Colon polyps Daughter   . Stroke Maternal Grandmother   . Heart disease Maternal Grandmother   . Other Maternal Grandfather        CHF  . Heart disease Maternal Grandfather        chf  . Cancer Paternal Grandmother        stomach?  . Other Paternal Grandfather        problems with kidneys  . Kidney disease Paternal Grandfather   . Anxiety disorder Daughter   . Cancer Maternal Aunt        breast  . Cancer Maternal Aunt        liver  . Colon cancer Neg Hx     Social History   Tobacco Use  . Smoking status: Former Smoker    Packs/day: 1.50    Years: 30.00    Pack years: 45.00    Types: Cigarettes    Quit date: 10/13/1991    Years since quitting: 28.9  . Smokeless tobacco: Never Used  Vaping Use  . Vaping Use: Never used  Substance Use Topics  . Alcohol use: No    Alcohol/week: 0.0 standard drinks  . Drug use: No     Home Medications Prior to Admission medications   Medication Sig Start Date End Date Taking? Authorizing Provider  albuterol (VENTOLIN HFA) 108 (90 Base) MCG/ACT inhaler Inhale 2 puffs into the lungs every 6 (six) hours as needed. For wheezing 08/22/19   Chesley Mires,  MD  ALPRAZolam (XANAX) 0.5 MG tablet TAKE 1/2 TO 1 TABLET BY MOUTH 3 TIMES DAILY AS NEEDED FOR ANXIETY- Last refill without appt 09/23/20   Midge Minium, MD  amLODipine (NORVASC) 5 MG tablet Take 1 tablet (5 mg total) by mouth daily. 05/28/20   Midge Minium, MD  benzonatate (TESSALON) 200 MG capsule Take 1 capsule (200 mg total) by mouth 3 (three) times daily as needed for cough. Patient taking differently: Take 200 mg by mouth 3 (three) times daily as needed for cough. Only as needed 10/26/19   Parrett, Fonnie Mu, NP  budesonide-formoterol (SYMBICORT) 160-4.5 MCG/ACT inhaler Inhale 2 puffs into the lungs 2 (two) times daily. 05/20/20   Parrett, Fonnie Mu, NP  cetirizine (ZYRTEC) 10 MG tablet 1 tablet    [provider]  dupilumab (DUPIXENT) 300 MG/2ML prefilled syringe Inject 300 mg into the skin every 14 (fourteen) days. 09/16/20   Parrett, Fonnie Mu, NP  escitalopram (LEXAPRO) 5 MG tablet TAKE 1 TABLET BY MOUTH EVERY DAY 09/13/20   Midge Minium, MD  fluticasone (FLONASE) 50 MCG/ACT nasal spray PLACE 1 SPRAY INTO BOTH NOSTRILS DAILY. 08/23/20   Chesley Mires, MD  furosemide (LASIX) 20 MG tablet Take 20 mg by mouth. Take 2 tablet daily as needed    [provider]  guaifenesin (HUMIBID E) 400 MG TABS tablet Take 400 mg by mouth 2 (two) times a day.    [provider]  ibuprofen (ADVIL,MOTRIN) 200 MG tablet Take 400 mg by mouth every 6 (six) hours as needed for moderate pain.    [provider]  levothyroxine (SYNTHROID) 112 MCG tablet TAKE 1 TABLET BY MOUTH EVERY DAY 05/31/20   Midge Minium, MD  meclizine (ANTIVERT) 25 MG tablet Take 1 tablet (25 mg total) by mouth 3 (three)  times daily as needed for up to 30 doses for dizziness. 05/07/20   Wyvonnia Dusky, MD  pantoprazole (PROTONIX) 40 MG tablet TAKE 1 TABLET BY MOUTH EVERY DAY 30 MINUTES BEFORE DINNER 04/09/20   Chesley Mires, MD  Respiratory Therapy Supplies (FLUTTER) DEVI 1 Device by Does not apply route in the morning, at noon, and at bedtime. Patient not taking: Reported on 09/25/2020 12/15/19   Martyn Ehrich, NP  esomeprazole (NEXIUM) 40 MG capsule Take 40 mg by mouth daily before breakfast.    12/18/11  [provider]    Allergies    Prednisone, Codeine, and Tramadol  Review of Systems   Review of Systems  All other systems reviewed and are negative.   Physical Exam Updated Vital Signs BP (!) 169/117   Pulse 93   Temp (!) 102 F (38.9 C) (Rectal)   Resp (!) 29   Ht 5\' 5"  (1.651 m)   Wt 98 kg   SpO2 98%   BMI 35.94 kg/m   Physical Exam Vitals and nursing note reviewed.  Constitutional:      General: She is not in acute distress.    Appearance: She is well-developed and well-nourished. She is obese. She is ill-appearing. She is not toxic-appearing or diaphoretic.  HENT:     Head: Normocephalic and atraumatic.     Nose: No congestion or rhinorrhea.     Mouth/Throat:     Mouth: Mucous membranes are dry.     Pharynx: No oropharyngeal exudate or posterior oropharyngeal erythema.  Eyes:     Extraocular Movements: EOM normal.     Conjunctiva/sclera: Conjunctivae normal.     Pupils: Pupils are  equal, round, and reactive to light.  Neck:     Trachea: Phonation normal.  Cardiovascular:     Rate and Rhythm: Normal rate.  Pulmonary:     Effort: Pulmonary effort is normal. No respiratory distress.     Breath sounds: No stridor.  Chest:     Chest wall: No tenderness.  Abdominal:     General: There is no distension.     Palpations: Abdomen is soft.     Tenderness: There is no abdominal tenderness. There is no guarding.  Musculoskeletal:        General: No swelling or  tenderness. Normal range of motion.     Cervical back: Normal range of motion and neck supple.  Skin:    General: Skin is warm and dry.     Coloration: Skin is not jaundiced or pale.  Neurological:     Mental Status: She is alert and oriented to person, place, and time.     Motor: No abnormal muscle tone.  Psychiatric:        Mood and Affect: Mood and affect and mood normal.        Behavior: Behavior normal.     ED Results / Procedures / Treatments   Labs (all labs ordered are listed, but only abnormal results are displayed) Labs Reviewed  RESP PANEL BY RT-PCR (FLU A&B, COVID) [id number]  URINE CULTURE  CULTURE, BLOOD (ROUTINE X 2)  CULTURE, BLOOD (ROUTINE X 2)  C-REACTIVE PROTEIN  COMPREHENSIVE METABOLIC PANEL  CBC WITH DIFFERENTIAL/PLATELET  D-DIMER, QUANTITATIVE (NOT AT [facility])  FERRITIN  FIBRINOGEN  PROCALCITONIN  URINALYSIS, ROUTINE W REFLEX MICROSCOPIC  LACTIC ACID, PLASMA  LACTIC ACID, PLASMA  TROPONIN I (HIGH SENSITIVITY)    EKG None  Radiology No results found.  Procedures Procedures (including critical care time)  Medications Ordered in ED Medications  albuterol (VENTOLIN HFA) 108 (90 Base) MCG/ACT inhaler 4 puff (has no administration in time range)  methylPREDNISolone sodium succinate (SOLU-MEDROL) 125 mg/2 mL injection 125 mg (has no administration in time range)  acetaminophen (TYLENOL) tablet 650 mg (has no administration in time range)    ED Course  I have reviewed the triage vital signs and the nursing notes.  Pertinent labs & imaging results that were available during my care of the patient were reviewed by me and considered in my medical decision making (see chart for details).  Clinical Course as of 09/29/20 2250  Sun Sep 29, 2020  2249 Initial temperature, by oral route normal, repeat temperature rectal, elevated 102. Tylenol ordered. [EW]    Clinical Course User Index [EW] Daleen Bo, MD   MDM Rules/Calculators/A&P                            Patient Vitals for the past 24 hrs:  BP Temp Temp src Pulse Resp SpO2 Height Weight  09/29/20 2243 -- (!) 102 F (38.9 C) Rectal -- -- -- -- --  09/29/20 2159 (!) 169/117 -- -- 93 (!) 29 98 % -- --  09/29/20 2127 (!) 158/78 99.4 F (37.4 C) Oral 97 18 95 % 5\' 5"  (1.651 m) 98 kg      Medical Decision Making:  This patient is presenting for evaluation of acute illness with fever, and malaise., which does require a range of treatment options, and is a complaint that involves a high risk of morbidity and mortality. The differential diagnoses include viral infection, bacterial infection, metabolic disorder, intravascular volume depletion.  I decided to review old records, and in summary elderly female, presenting with acute onset of fever and malaise today, without clear localizing symptoms. She has both sore throat and urinary frequency.  I required additional historical information from daughter by telephone.  Clinical Laboratory Tests Ordered, included CBC, Metabolic panel, Urinalysis and Covid inflammatory markers, Covid and flu test, blood cultures, troponin, lactate. Review indicates positive Covid test and Covid inflammatory markers consistent with moderate disease.. Radiologic Tests Ordered, included chest x-ray.  I independently Visualized: Chest x-ray images, which show no infiltrate or edema  .  Critical Interventions-clinical evaluation, laboratory testing, chest x-ray, observation.  After These Interventions, the Patient was reevaluated and was found to present with febrile illness, moderate risk for Covid, as well as urinary tract infection.  Patient with debilitated state, from baseline.  She will likely require hospitalization.  CRITICAL CARE-no Performed by: Daleen Bo  Nursing Notes Reviewed/ Care Coordinated Applicable Imaging Reviewed Interpretation of Laboratory Data incorporated into ED treatment  Plan disposition by Dr. Maurie Boettcher following completion of  laboratory testing.    Final Clinical Impression(s) / ED Diagnoses Final diagnoses:  [id number]    Rx / DC Orders ED Discharge Orders    None       Daleen Bo, MD 09/30/20 [phone]

## 2020-09-29 NOTE — ED Provider Notes (Signed)
  Provider Note MRN:  [id number]  Arrival date & time: 09/30/20    ED Course and Medical Decision Making  Assumed care from Dr. Eulis Foster at shift change.  Fever, AMS, suspect will need admission.  Covid positive, new oxygen requirement, admitted to hospitalist service for further care.  Procedures  Final Clinical Impressions(s) / ED Diagnoses     ICD-10-CM   1. COVID-19  U07.1     ED Discharge Orders    None      Discharge Instructions   None     Barth Kirks. Sedonia Small, Secretary [email or web address]    Maudie Flakes, MD 09/30/20 [phone]

## 2020-09-30 ENCOUNTER — Encounter (HOSPITAL_COMMUNITY): Payer: Self-pay | Admitting: Internal Medicine

## 2020-09-30 ENCOUNTER — Telehealth: Payer: Self-pay | Admitting: Pulmonary Disease

## 2020-09-30 ENCOUNTER — Telehealth: Payer: Self-pay | Admitting: Family Medicine

## 2020-09-30 DIAGNOSIS — Z9989 Dependence on other enabling machines and devices: Secondary | ICD-10-CM

## 2020-09-30 DIAGNOSIS — J8489 Other specified interstitial pulmonary diseases: Secondary | ICD-10-CM | POA: Diagnosis not present

## 2020-09-30 DIAGNOSIS — U071 COVID-19: Principal | ICD-10-CM | POA: Diagnosis present

## 2020-09-30 DIAGNOSIS — Z79890 Hormone replacement therapy (postmenopausal): Secondary | ICD-10-CM | POA: Diagnosis not present

## 2020-09-30 DIAGNOSIS — I10 Essential (primary) hypertension: Secondary | ICD-10-CM

## 2020-09-30 DIAGNOSIS — J1282 Pneumonia due to coronavirus disease 2019: Secondary | ICD-10-CM | POA: Diagnosis not present

## 2020-09-30 DIAGNOSIS — Z87891 Personal history of nicotine dependence: Secondary | ICD-10-CM | POA: Diagnosis not present

## 2020-09-30 DIAGNOSIS — Z8249 Family history of ischemic heart disease and other diseases of the circulatory system: Secondary | ICD-10-CM | POA: Diagnosis not present

## 2020-09-30 DIAGNOSIS — Z7951 Long term (current) use of inhaled steroids: Secondary | ICD-10-CM | POA: Diagnosis not present

## 2020-09-30 DIAGNOSIS — G4733 Obstructive sleep apnea (adult) (pediatric): Secondary | ICD-10-CM

## 2020-09-30 DIAGNOSIS — F32A Depression, unspecified: Secondary | ICD-10-CM | POA: Diagnosis present

## 2020-09-30 DIAGNOSIS — E039 Hypothyroidism, unspecified: Secondary | ICD-10-CM | POA: Diagnosis not present

## 2020-09-30 DIAGNOSIS — J8283 Eosinophilic asthma: Secondary | ICD-10-CM | POA: Diagnosis not present

## 2020-09-30 DIAGNOSIS — R350 Frequency of micturition: Secondary | ICD-10-CM | POA: Diagnosis present

## 2020-09-30 DIAGNOSIS — R7989 Other specified abnormal findings of blood chemistry: Secondary | ICD-10-CM | POA: Diagnosis present

## 2020-09-30 DIAGNOSIS — Z79899 Other long term (current) drug therapy: Secondary | ICD-10-CM | POA: Diagnosis not present

## 2020-09-30 DIAGNOSIS — R0902 Hypoxemia: Secondary | ICD-10-CM | POA: Diagnosis not present

## 2020-09-30 DIAGNOSIS — Z8261 Family history of arthritis: Secondary | ICD-10-CM | POA: Diagnosis not present

## 2020-09-30 DIAGNOSIS — D72829 Elevated white blood cell count, unspecified: Secondary | ICD-10-CM | POA: Diagnosis present

## 2020-09-30 DIAGNOSIS — Z6835 Body mass index (BMI) 35.0-35.9, adult: Secondary | ICD-10-CM | POA: Diagnosis not present

## 2020-09-30 DIAGNOSIS — K219 Gastro-esophageal reflux disease without esophagitis: Secondary | ICD-10-CM

## 2020-09-30 DIAGNOSIS — R739 Hyperglycemia, unspecified: Secondary | ICD-10-CM | POA: Diagnosis not present

## 2020-09-30 DIAGNOSIS — R69 Illness, unspecified: Secondary | ICD-10-CM | POA: Diagnosis not present

## 2020-09-30 DIAGNOSIS — E669 Obesity, unspecified: Secondary | ICD-10-CM | POA: Diagnosis present

## 2020-09-30 DIAGNOSIS — Z825 Family history of asthma and other chronic lower respiratory diseases: Secondary | ICD-10-CM | POA: Diagnosis not present

## 2020-09-30 LAB — COMPREHENSIVE METABOLIC PANEL
ALT: 16 U/L (ref 0–44)
ALT: 16 U/L (ref 0–44)
AST: 22 U/L (ref 15–41)
AST: 23 U/L (ref 15–41)
Albumin: 3.7 g/dL (ref 3.5–5.0)
Albumin: 3.4 g/dL — ABNORMAL LOW (ref 3.5–5.0)
Alkaline Phosphatase: 88 U/L (ref 38–126)
Alkaline Phosphatase: 84 U/L (ref 38–126)
Anion gap: 11 (ref 5–15)
Anion gap: 13 (ref 5–15)
BUN: 12 mg/dL (ref 8–23)
BUN: 15 mg/dL (ref 8–23)
CO2: 25 mmol/L (ref 22–32)
CO2: 23 mmol/L (ref 22–32)
Calcium: 8.4 mg/dL — ABNORMAL LOW (ref 8.9–10.3)
Calcium: 8.3 mg/dL — ABNORMAL LOW (ref 8.9–10.3)
Chloride: 99 mmol/L (ref 98–111)
Chloride: 100 mmol/L (ref 98–111)
Creatinine, Ser: 1.01 mg/dL — ABNORMAL HIGH (ref 0.44–1.00)
Creatinine, Ser: 0.98 mg/dL (ref 0.44–1.00)
GFR, Estimated: 58 mL/min — ABNORMAL LOW (ref >60)
Glucose, Bld: 149 mg/dL — ABNORMAL HIGH (ref 70–99)
Glucose, Bld: 214 mg/dL — ABNORMAL HIGH (ref 70–99)
Potassium: 4.1 mmol/L (ref 3.5–5.1)
Potassium: 3.7 mmol/L (ref 3.5–5.1)
Sodium: 135 mmol/L (ref 135–145)
Sodium: 136 mmol/L (ref 135–145)
Total Bilirubin: 0.7 mg/dL (ref 0.3–1.2)
Total Bilirubin: 0.5 mg/dL (ref 0.3–1.2)
Total Protein: 6.9 g/dL (ref 6.5–8.1)
Total Protein: 6.8 g/dL (ref 6.5–8.1)

## 2020-09-30 LAB — LACTIC ACID, PLASMA
Lactic Acid, Venous: 1.2 mmol/L (ref 0.5–1.9)
Lactic Acid, Venous: 1.2 mmol/L (ref 0.5–1.9)

## 2020-09-30 LAB — URINALYSIS, ROUTINE W REFLEX MICROSCOPIC
Bacteria, UA: NONE SEEN
Bilirubin Urine: NEGATIVE
Glucose, UA: NEGATIVE mg/dL
Ketones, ur: 5 mg/dL — AB
Leukocytes,Ua: NEGATIVE
Nitrite: NEGATIVE
Protein, ur: NEGATIVE mg/dL
Specific Gravity, Urine: 1.016 (ref 1.005–1.030)
pH: 6 (ref 5.0–8.0)

## 2020-09-30 LAB — CBC WITH DIFFERENTIAL/PLATELET
Abs Immature Granulocytes: 0.06 10*3/uL (ref 0.00–0.07)
Abs Immature Granulocytes: 0.08 10*3/uL — ABNORMAL HIGH (ref 0.00–0.07)
Basophils Absolute: 0.1 10*3/uL (ref 0.0–0.1)
Basophils Absolute: 0 10*3/uL (ref 0.0–0.1)
Basophils Relative: 1 %
Basophils Relative: 0 %
Eosinophils Absolute: 0.1 10*3/uL (ref 0.0–0.5)
Eosinophils Absolute: 0 10*3/uL (ref 0.0–0.5)
Eosinophils Relative: 1 %
Eosinophils Relative: 0 %
HCT: 44.6 % (ref 36.0–46.0)
HCT: 44.3 % (ref 36.0–46.0)
Hemoglobin: 14.1 g/dL (ref 12.0–15.0)
Hemoglobin: 14.3 g/dL (ref 12.0–15.0)
Immature Granulocytes: 1 %
Immature Granulocytes: 1 %
Lymphocytes Relative: 10 %
Lymphocytes Relative: 7 %
Lymphs Abs: 1.1 10*3/uL (ref 0.7–4.0)
Lymphs Abs: 0.7 10*3/uL (ref 0.7–4.0)
MCH: 29.7 pg (ref 26.0–34.0)
MCH: 29.7 pg (ref 26.0–34.0)
MCHC: 31.6 g/dL (ref 30.0–36.0)
MCHC: 32.3 g/dL (ref 30.0–36.0)
MCV: 93.9 fL (ref 80.0–100.0)
MCV: 92.1 fL (ref 80.0–100.0)
Monocytes Absolute: 1 10*3/uL (ref 0.1–1.0)
Monocytes Absolute: 0.2 10*3/uL (ref 0.1–1.0)
Monocytes Relative: 9 %
Monocytes Relative: 2 %
Neutro Abs: 9.3 10*3/uL — ABNORMAL HIGH (ref 1.7–7.7)
Neutro Abs: 9.3 10*3/uL — ABNORMAL HIGH (ref 1.7–7.7)
Neutrophils Relative %: 78 %
Neutrophils Relative %: 90 %
Platelets: 208 10*3/uL (ref 150–400)
Platelets: 230 10*3/uL (ref 150–400)
RBC: 4.75 MIL/uL (ref 3.87–5.11)
RBC: 4.81 MIL/uL (ref 3.87–5.11)
RDW: 13.1 % (ref 11.5–15.5)
RDW: 13.1 % (ref 11.5–15.5)
WBC: 11.6 10*3/uL — ABNORMAL HIGH (ref 4.0–10.5)
WBC: 10.4 10*3/uL (ref 4.0–10.5)
nRBC: 0 % (ref 0.0–0.2)
nRBC: 0 % (ref 0.0–0.2)

## 2020-09-30 LAB — URINE CULTURE: Culture: <10000 — AB

## 2020-09-30 LAB — TROPONIN I (HIGH SENSITIVITY)
Troponin I (High Sensitivity): 4 ng/L (ref <18)
Troponin I (High Sensitivity): 5 ng/L (ref <18)

## 2020-09-30 LAB — RESP PANEL BY RT-PCR (FLU A&B, COVID) ARPGX2
Influenza A by PCR: NEGATIVE
Influenza B by PCR: NEGATIVE
SARS Coronavirus 2 by RT PCR: POSITIVE — AB

## 2020-09-30 LAB — C-REACTIVE PROTEIN
CRP: 8.3 mg/dL — ABNORMAL HIGH (ref <1.0)
CRP: 10 mg/dL — ABNORMAL HIGH (ref <1.0)

## 2020-09-30 LAB — HEMOGLOBIN A1C
Hgb A1c MFr Bld: 6.2 % — ABNORMAL HIGH (ref 4.8–5.6)
Mean Plasma Glucose: 131.24 mg/dL

## 2020-09-30 LAB — GLUCOSE, CAPILLARY
Glucose-Capillary: 179 mg/dL — ABNORMAL HIGH (ref 70–99)
Glucose-Capillary: 193 mg/dL — ABNORMAL HIGH (ref 70–99)

## 2020-09-30 LAB — D-DIMER, QUANTITATIVE
D-Dimer, Quant: 2.57 ug/mL-FEU — ABNORMAL HIGH (ref 0.00–0.50)
D-Dimer, Quant: 1.4 ug/mL-FEU — ABNORMAL HIGH (ref 0.00–0.50)

## 2020-09-30 LAB — CBG MONITORING, ED
Glucose-Capillary: 213 mg/dL — ABNORMAL HIGH (ref 70–99)
Glucose-Capillary: 294 mg/dL — ABNORMAL HIGH (ref 70–99)

## 2020-09-30 LAB — MAGNESIUM: Magnesium: 2 mg/dL (ref 1.7–2.4)

## 2020-09-30 LAB — FIBRINOGEN: Fibrinogen: 347 mg/dL (ref 210–475)

## 2020-09-30 LAB — FERRITIN: Ferritin: 124 ng/mL (ref 11–307)

## 2020-09-30 LAB — PROCALCITONIN: Procalcitonin: <0.1 ng/mL

## 2020-09-30 MED ORDER — DEXAMETHASONE SODIUM PHOSPHATE 10 MG/ML IJ SOLN
6.0000 mg | INTRAMUSCULAR | Status: DC
  Administered 2020-09-30 – 2020-10-04 (×5): 6 mg via INTRAVENOUS
  Filled 2020-09-30 (×5): qty 1

## 2020-09-30 MED ORDER — ESCITALOPRAM OXALATE 10 MG PO TABS
5.0000 mg | ORAL_TABLET | Freq: Every day | ORAL | Status: DC
  Administered 2020-09-30 – 2020-10-04 (×5): 5 mg via ORAL
  Filled 2020-09-30 (×5): qty 1

## 2020-09-30 MED ORDER — INSULIN ASPART 100 UNIT/ML ~~LOC~~ SOLN
0.0000 [IU] | Freq: Three times a day (TID) | SUBCUTANEOUS | Status: DC
  Administered 2020-09-30: 10:00:00 3 [IU] via SUBCUTANEOUS
  Administered 2020-09-30: 13:00:00 5 [IU] via SUBCUTANEOUS
  Administered 2020-09-30: 17:00:00 2 [IU] via SUBCUTANEOUS
  Administered 2020-10-01: 10:00:00 1 [IU] via SUBCUTANEOUS
  Administered 2020-10-01 (×2): 5 [IU] via SUBCUTANEOUS
  Administered 2020-10-02: 18:00:00 7 [IU] via SUBCUTANEOUS
  Administered 2020-10-02: 13:00:00 5 [IU] via SUBCUTANEOUS
  Administered 2020-10-03: 11:00:00 1 [IU] via SUBCUTANEOUS
  Administered 2020-10-03: 12:00:00 3 [IU] via SUBCUTANEOUS
  Administered 2020-10-03: 17:00:00 5 [IU] via SUBCUTANEOUS
  Filled 2020-09-30: qty 0.09

## 2020-09-30 MED ORDER — SODIUM CHLORIDE 0.9 % IV SOLN
200.0000 mg | Freq: Once | INTRAVENOUS | Status: AC
  Administered 2020-09-30: 04:00:00 200 mg via INTRAVENOUS
  Filled 2020-09-30: qty 200

## 2020-09-30 MED ORDER — ONDANSETRON HCL 4 MG/2ML IJ SOLN: 4.0000 mg | Freq: Four times a day (QID) | INTRAMUSCULAR | Status: DC | PRN

## 2020-09-30 MED ORDER — FLUTICASONE FUROATE-VILANTEROL 200-25 MCG/INH IN AEPB
1.0000 | INHALATION_SPRAY | Freq: Every day | RESPIRATORY_TRACT | Status: DC
  Administered 2020-09-30 – 2020-10-04 (×5): 1 via RESPIRATORY_TRACT
  Filled 2020-09-30 (×2): qty 28

## 2020-09-30 MED ORDER — SODIUM CHLORIDE 0.9 % IV SOLN
100.0000 mg | Freq: Every day | INTRAVENOUS | Status: AC
  Administered 2020-10-01 – 2020-10-04 (×4): 100 mg via INTRAVENOUS
  Filled 2020-09-30 (×4): qty 20

## 2020-09-30 MED ORDER — LEVOTHYROXINE SODIUM 112 MCG PO TABS
112.0000 ug | ORAL_TABLET | Freq: Every day | ORAL | Status: DC
  Administered 2020-09-30 – 2020-10-04 (×5): 112 ug via ORAL
  Filled 2020-09-30 (×5): qty 1

## 2020-09-30 MED ORDER — ASCORBIC ACID 500 MG PO TABS
500.0000 mg | ORAL_TABLET | Freq: Every day | ORAL | Status: DC
  Administered 2020-09-30 – 2020-10-04 (×5): 500 mg via ORAL
  Filled 2020-09-30 (×5): qty 1

## 2020-09-30 MED ORDER — GUAIFENESIN-DM 100-10 MG/5ML PO SYRP
10.0000 mL | ORAL_SOLUTION | ORAL | Status: DC | PRN
  Administered 2020-09-30 – 2020-10-02 (×3): 10 mL via ORAL
  Filled 2020-09-30 (×3): qty 10

## 2020-09-30 MED ORDER — FLUTICASONE PROPIONATE 50 MCG/ACT NA SUSP
1.0000 | Freq: Every day | NASAL | Status: DC
  Administered 2020-09-30 – 2020-10-04 (×5): 1 via NASAL
  Filled 2020-09-30 (×2): qty 16

## 2020-09-30 MED ORDER — PANTOPRAZOLE SODIUM 40 MG PO TBEC
40.0000 mg | DELAYED_RELEASE_TABLET | Freq: Every day | ORAL | Status: DC
  Administered 2020-09-30 – 2020-10-04 (×5): 40 mg via ORAL
  Filled 2020-09-30 (×5): qty 1

## 2020-09-30 MED ORDER — ALPRAZOLAM 0.5 MG PO TABS
0.5000 mg | ORAL_TABLET | Freq: Three times a day (TID) | ORAL | Status: DC | PRN
  Administered 2020-09-30 – 2020-10-03 (×4): 0.5 mg via ORAL
  Filled 2020-09-30 (×4): qty 1

## 2020-09-30 MED ORDER — AMLODIPINE BESYLATE 5 MG PO TABS
5.0000 mg | ORAL_TABLET | Freq: Every day | ORAL | Status: DC
  Administered 2020-09-30 – 2020-10-04 (×5): 5 mg via ORAL
  Filled 2020-09-30 (×5): qty 1

## 2020-09-30 MED ORDER — POLYETHYLENE GLYCOL 3350 17 G PO PACK: 17.0000 g | PACK | Freq: Every day | ORAL | Status: DC | PRN

## 2020-09-30 MED ORDER — ALBUTEROL SULFATE HFA 108 (90 BASE) MCG/ACT IN AERS
2.0000 | INHALATION_SPRAY | RESPIRATORY_TRACT | Status: DC | PRN
  Administered 2020-10-01 – 2020-10-02 (×2): 2 via RESPIRATORY_TRACT

## 2020-09-30 MED ORDER — ENOXAPARIN SODIUM 40 MG/0.4ML ~~LOC~~ SOLN
40.0000 mg | SUBCUTANEOUS | Status: DC
  Administered 2020-09-30 – 2020-10-03 (×4): 40 mg via SUBCUTANEOUS
  Filled 2020-09-30 (×4): qty 0.4

## 2020-09-30 MED ORDER — ACETAMINOPHEN 325 MG PO TABS
650.0000 mg | ORAL_TABLET | Freq: Four times a day (QID) | ORAL | Status: DC | PRN
  Administered 2020-09-30 – 2020-10-03 (×6): 650 mg via ORAL
  Filled 2020-09-30 (×6): qty 2

## 2020-09-30 MED ORDER — ZINC SULFATE 220 (50 ZN) MG PO CAPS
220.0000 mg | ORAL_CAPSULE | Freq: Every day | ORAL | Status: DC
  Administered 2020-09-30 – 2020-10-04 (×5): 220 mg via ORAL
  Filled 2020-09-30 (×5): qty 1

## 2020-09-30 MED ORDER — SODIUM CHLORIDE 0.9 % IV SOLN
2.0000 g | Freq: Once | INTRAVENOUS | Status: AC
  Administered 2020-09-30: 01:00:00 2 g via INTRAVENOUS
  Filled 2020-09-30: qty 20

## 2020-09-30 MED ORDER — LACTATED RINGERS IV SOLN: INTRAVENOUS | Status: DC

## 2020-09-30 MED ORDER — ONDANSETRON HCL 4 MG PO TABS: 4.0000 mg | ORAL_TABLET | Freq: Four times a day (QID) | ORAL | Status: DC | PRN

## 2020-09-30 NOTE — Progress Notes (Signed)
Patient arrived from ED via stretcher. Able to shift from stretcher to bed with no issue. All VSS and 98% on 2liters O2. Purewick in place and hooked to suction appropriate. Telemetry monitor hooked to patient and verified with NT. No complaints or signs of distress noted. Call bell in reach.

## 2020-09-30 NOTE — Telephone Encounter (Signed)
LM making pt aware that she needs to cpe in 68mths and labs work now.

## 2020-09-30 NOTE — Progress Notes (Signed)
79 year old female with past medical history of eosinophilic asthma, NSIP, gastroesophageal reflux disease, hypothyroidism, depression, obstructive sleep apnea who presents to [facility] emergency department with complaints of weakness cough and fevers.  Patient explains that for approximately the past 2 days she has been experiencing severe generalized body aches. This is associated generalized weakness and cough, typically dry cough. Patient has been experiencing poor oral intake over the same time as well as sore throat and wheezing. Patient remarkably denies shortness of breath however.  Over the following 48 hours patient symptoms rapid worsened. Patient's weakness has become quite severe, worse with minimal exertion and improved with rest.  Patient eventually presented to [facility] emergency room for evaluation. Upon evaluation in the emergency department, COVID-19 PCR testing has been found to be positive. Patient is also been found to exhibit relative hypoxia on arrival and has been placed on submental oxygen via nasal cannula. The hospitalist group has now been called to assess the hospital.  09/30/20: Seen and examined at her bedside this morning.  She reports persistent nonproductive cough.  She denies any chest pain at the time of this visit.  Also admits to a sore throat.  O2 saturations currently 96% on room air.  She received IV remdesivir and is on IV Decadron.  Please refer to H&P dictated by my partner Dr. Cyd Silence on 09/30/2020 for further details of the assessment and plan.

## 2020-09-30 NOTE — ED Notes (Signed)
Date and time results received: 09/30/20 2:28 AM  (use smartphrase ".now" to insert current time)  Test: COVID Critical Value: POSITIVE  Name of Provider Notified: Sedonia Small, MD  Orders Received? Or Actions Taken?: No new orders at this time

## 2020-09-30 NOTE — H&P (Signed)
History and Physical    Jenny [name] EYC:[id number] DOB: 04-08-1941 DOA: 09/29/2020  PCP: Midge Minium, MD  Patient coming from: Home   Chief Complaint:  Chief Complaint  Patient presents with  . Weakness  . Sore Throat     HPI:    79 year old female with past medical history of eosinophilic asthma, NSIP, gastroesophageal reflux disease, hypothyroidism, depression, obstructive sleep apnea who presents to [facility] emergency department with complaints of weakness cough and fevers.  Patient explains that for approximately the past 2 days she has been experiencing severe generalized body aches. This is associated generalized weakness and cough, typically dry cough. Patient has been experiencing poor oral intake over the same time as well as sore throat and wheezing. Patient remarkably denies shortness of breath however.  Over the following 48 hours patient symptoms rapid worsened. Patient's weakness has become quite severe, worse with minimal exertion and improved with rest.  Patient eventually presented to [facility] emergency room for evaluation. Upon evaluation in the emergency department, COVID-19 PCR testing has been found to be positive. Patient is also been found to exhibit relative hypoxia on arrival and has been placed on submental oxygen via nasal cannula. The hospitalist group has now been called to assess the hospital  Review of Systems:   Review of Systems  Constitutional: Positive for fever and malaise/fatigue.  Respiratory: Positive for cough, shortness of breath and wheezing.   Musculoskeletal: Positive for myalgias.  Neurological: Positive for weakness.  All other systems reviewed and are negative.   Past Medical History:  Diagnosis Date  . ALLERGIC RHINITIS 08/23/2007  . Allergy    rhinitis  . 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 Anemia 09/21/2011  . Annual physical exam 08/06/2013  . Asthma    mild, intermittent  . Asthma  01/14/2011  . Atrophic vaginitis 06/17/2012  . BACK PAIN, LUMBAR 08/23/2007  . Bronchitis   . BRONCHITIS, RECURRENT 02/22/2008  . Cough    usually not productive  . Degenerative joint disease   . DEGENERATIVE JOINT DISEASE 02/22/2008  . Depression with anxiety 03/06/2008   Qualifier: Diagnosis of  By: Lenna Gilford MD, Deborra Medina   . Diverticulosis of colon   . DIVERTICULOSIS OF COLON 02/22/2008  . Dysthymia   . 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  . elevated glucose 08/21/2008  . GERD 08/23/2007  . GERD (gastroesophageal reflux disease)   . HAND PAIN 04/09/2010  . History of chicken pox 01/14/2011  . History of measles 01/14/2011  . Hyperglycemia 07/07/2015  . Hypertension   . HYPERTENSION 02/22/2008  . Hypothyroidism   . HYPOTHYROIDISM 08/23/2007  . Incontinence of urine   . Lumbar back pain   . Obesity   . OBESITY 08/23/2007  . Obstructive sleep apnea    does not use CPAP, lost weight  . Other urinary incontinence 03/12/2010  . Peripheral neuropathy 02/12/2011   possibly related to spondylolisthesis per pt  . PONV (postoperative nausea and vomiting)   . Sleep apnea   . SLEEP APNEA, OBSTRUCTIVE 08/23/2007  . Transient memory loss March 2006   after a stressful event she had no memory the rest of that day  . Urinary frequency   . Urinary incontinence   . UTI (lower urinary tract infection) 03/23/2012  . Venous insufficiency   . VENOUS INSUFFICIENCY 08/23/2007  . Vitamin D deficiency   . VITAMIN D DEFICIENCY 08/21/2008    Past Surgical History:  Procedure Laterality Date  . ABDOMINAL HYSTERECTOMY  took one ovary  . AP repair and sling for cystocele  8-04    Dr Gaynelle Arabian  . APPENDECTOMY    . carpal tunel  2008  . CHOLECYSTECTOMY    . ctr     bilateral  . EYE SURGERY    . left hand  2010   left thumb, joint removal, CTR  . NECK SURGERY     x 2 - discectomy, fusion  . SHOULDER ARTHROSCOPY DISTAL CLAVICLE EXCISION AND OPEN ROTATOR CUFF REPAIR     right  . TONSILLECTOMY AND ADENOIDECTOMY    .  TUBAL LIGATION       reports that she quit smoking about 28 years ago. Her smoking use included cigarettes. She has a 45.00 pack-year smoking history. She has never used smokeless tobacco. She reports that she does not drink alcohol and does not use drugs.  Allergies  Allergen Reactions  . Prednisone Other (See Comments)    Confusion, dizziness Medrol is ok  . Codeine Nausea Only       . Tramadol Itching    Family History  Problem Relation Age of Onset  . COPD Mother   . Pneumonia Mother   . Other Mother        CHF  . Hypertension Mother   . Arthritis Mother        septic knee after replacement  . Heart disease Mother        chf  . Dementia Father   . Arthritis Brother   . Asthma Daughter   . Colon polyps Daughter   . Stroke Maternal Grandmother   . Heart disease Maternal Grandmother   . Other Maternal Grandfather        CHF  . Heart disease Maternal Grandfather        chf  . Cancer Paternal Grandmother        stomach?  . Other Paternal Grandfather        problems with kidneys  . Kidney disease Paternal Grandfather   . Anxiety disorder Daughter   . Cancer Maternal Aunt        breast  . Cancer Maternal Aunt        liver  . Colon cancer Neg Hx      Prior to Admission medications   Medication Sig Start Date End Date Taking? Authorizing Provider  albuterol (VENTOLIN HFA) 108 (90 Base) MCG/ACT inhaler Inhale 2 puffs into the lungs every 6 (six) hours as needed. For wheezing 08/22/19   Chesley Mires, MD  ALPRAZolam Duanne Moron) 0.5 MG tablet TAKE 1/2 TO 1 TABLET BY MOUTH 3 TIMES DAILY AS NEEDED FOR ANXIETY- Last refill without appt 09/23/20   Midge Minium, MD  amLODipine (NORVASC) 5 MG tablet Take 1 tablet (5 mg total) by mouth daily. 05/28/20   Midge Minium, MD  benzonatate (TESSALON) 200 MG capsule Take 1 capsule (200 mg total) by mouth 3 (three) times daily as needed for cough. Patient taking differently: Take 200 mg by mouth 3 (three) times daily as needed  for cough. Only as needed 10/26/19   Parrett, Fonnie Mu, NP  budesonide-formoterol (SYMBICORT) 160-4.5 MCG/ACT inhaler Inhale 2 puffs into the lungs 2 (two) times daily. 05/20/20   Parrett, Fonnie Mu, NP  cetirizine (ZYRTEC) 10 MG tablet 1 tablet    [provider]  dupilumab (DUPIXENT) 300 MG/2ML prefilled syringe Inject 300 mg into the skin every 14 (fourteen) days. 09/16/20   Parrett, Fonnie Mu, NP  escitalopram (LEXAPRO) 5 MG tablet  TAKE 1 TABLET BY MOUTH EVERY DAY 09/13/20   Midge Minium, MD  fluticasone (FLONASE) 50 MCG/ACT nasal spray PLACE 1 SPRAY INTO BOTH NOSTRILS DAILY. 08/23/20   Chesley Mires, MD  furosemide (LASIX) 20 MG tablet Take 20 mg by mouth. Take 2 tablet daily as needed    [provider]  guaifenesin (HUMIBID E) 400 MG TABS tablet Take 400 mg by mouth 2 (two) times a day.    [provider]  ibuprofen (ADVIL,MOTRIN) 200 MG tablet Take 400 mg by mouth every 6 (six) hours as needed for moderate pain.    [provider]  levothyroxine (SYNTHROID) 112 MCG tablet TAKE 1 TABLET BY MOUTH EVERY DAY 05/31/20   Midge Minium, MD  meclizine (ANTIVERT) 25 MG tablet Take 1 tablet (25 mg total) by mouth 3 (three) times daily as needed for up to 30 doses for dizziness. 05/07/20   Wyvonnia Dusky, MD  pantoprazole (PROTONIX) 40 MG tablet TAKE 1 TABLET BY MOUTH EVERY DAY 30 MINUTES BEFORE DINNER 04/09/20   Chesley Mires, MD  Respiratory Therapy Supplies (FLUTTER) DEVI 1 Device by Does not apply route in the morning, at noon, and at bedtime. Patient not taking: Reported on 09/25/2020 12/15/19   Martyn Ehrich, NP  esomeprazole ([address]) 40 MG capsule Take 40 mg by mouth daily before breakfast.    12/18/11  [provider]    Physical Exam: Vitals:   09/30/20 0110 09/30/20 0115 09/30/20 0145 09/30/20 0200  BP: 136/65 131/70 132/67 129/69  Pulse: 86 86 82 81  Resp: (!) 31 (!) 31 (!) 22 (!) 24  Temp: 99.3 F (37.4 C)     TempSrc: Oral     SpO2:  97% 96% 95% 95%  Weight:      Height:        Constitutional: Acute alert and oriented x3, patient is in slight respiratory distress. Skin: no rashes, no lesions, poor skin turgor  Eyes: Pupils are equally reactive to light.  No evidence of scleral icterus or conjunctival pallor.  ENMT: Dry mucous membranes noted.  Posterior pharynx clear of any exudate or lesions.   Neck: normal, supple, no masses, no thyromegaly.  No evidence of jugular venous distension.   Respiratory: Notable intermittent expiratory wheezing heard in all fields. Faint bibasilar rales noted. Patient is tachypneic without accessory muscle use.  Cardiovascular: Regular rate and rhythm, no murmurs / rubs / gallops. No extremity edema. 2+ pedal pulses. No carotid bruits.  Chest:   Nontender without crepitus or deformity.   Back:   Nontender without crepitus or deformity. Abdomen: Abdomen is soft and nontender.  No evidence of intra-abdominal masses.  Positive bowel sounds noted in all quadrants.   Musculoskeletal: No joint deformity upper and lower extremities. Good ROM, no contractures. Normal muscle tone.  Neurologic: CN 2-12 grossly intact. Sensation intact.  Patient moving all 4 extremities spontaneously.  Patient is following all commands.  Patient is responsive to verbal stimuli.   Psychiatric: Patient exhibits normal mood with appropriate affect.  Patient seems to possess insight as to their current situation.     Labs on Admission: I have personally reviewed following labs and imaging studies -   CBC: Recent Labs  Lab 09/29/20 2248  WBC 11.6*  NEUTROABS 9.3*  HGB 14.1  HCT 44.6  MCV 93.9  PLT 094   Basic Metabolic Panel: Recent Labs  Lab 09/29/20 2248  NA 135  K 4.1  CL 99  CO2 25  GLUCOSE  149*  BUN 12  CREATININE 1.01*  CALCIUM 8.4*   GFR: Estimated Creatinine Clearance: 50.6 mL/min (A) (by C-G formula based on SCr of 1.01 mg/dL (H)). Liver Function Tests: Recent Labs  Lab 09/29/20 2248  AST  22  ALT 16  ALKPHOS 88  BILITOT 0.7  PROT 6.9  ALBUMIN 3.7   No results for input(s): LIPASE, AMYLASE in the last 168 hours. No results for input(s): AMMONIA in the last 168 hours. Coagulation Profile: No results for input(s): INR, PROTIME in the last 168 hours. Cardiac Enzymes: No results for input(s): CKTOTAL, CKMB, CKMBINDEX, TROPONINI in the last 168 hours. BNP (last 3 results) No results for input(s): PROBNP in the last 8760 hours. HbA1C: No results for input(s): HGBA1C in the last 72 hours. CBG: No results for input(s): GLUCAP in the last 168 hours. Lipid Profile: No results for input(s): CHOL, HDL, LDLCALC, TRIG, CHOLHDL, LDLDIRECT in the last 72 hours. Thyroid Function Tests: No results for input(s): TSH, T4TOTAL, FREET4, T3FREE, THYROIDAB in the last 72 hours. Anemia Panel: Recent Labs    09/29/20 2248  FERRITIN 124   Urine analysis:    Component Value Date/Time   COLORURINE YELLOW 09/30/2020 0112   APPEARANCEUR CLEAR 09/30/2020 0112   LABSPEC 1.016 09/30/2020 0112   PHURINE 6.0 09/30/2020 0112   GLUCOSEU NEGATIVE 09/30/2020 0112   HGBUR SMALL (A) 09/30/2020 0112   BILIRUBINUR NEGATIVE 09/30/2020 0112   BILIRUBINUR neg 11/01/2015 0828   KETONESUR 5 (A) 09/30/2020 0112   PROTEINUR NEGATIVE 09/30/2020 0112   UROBILINOGEN 0.2 11/01/2015 0828   UROBILINOGEN 0.2 11/03/2013 1114   NITRITE NEGATIVE 09/30/2020 0112   LEUKOCYTESUR NEGATIVE 09/30/2020 0112    Radiological Exams on Admission - Personally Reviewed: DG Chest Port 1 View  Result Date: 09/29/2020 CLINICAL DATA:  Weakness, dizziness and fever. EXAM: PORTABLE CHEST 1 VIEW COMPARISON:  October 26, 2019 FINDINGS: The heart size and mediastinal contours are within normal limits. Both lungs are clear. The visualized skeletal structures are unremarkable. IMPRESSION: No active disease. Electronically Signed   By: Virgina Norfolk M.D.   On: 09/29/2020 23:25    EKG: Personally reviewed.  Rhythm is normal  sinus rhythm with heart rate of 97 bpm.  No dynamic ST segment changes appreciated.  Assessment/Plan Principal Problem:   COVID-19 virus infection   Patient presenting with severe weakness, body aches cough and fevers  Patient found to exhibit mild hypoxia COVID-19 PCR testing it is positive  Patient is at high risk of clinical decompensation due to known history of eosinophilic asthma as well as NSIP  Placing patient on intravenous dexamethasone as well as intravenous remdesivir  Continue to provide patient supplement oxygen as targeting oxygen saturation is 94% or higher  Providing patient with hearing bronchodilator therapy via MDI associated wheezing  Providing patient with as needed Antitussives  Placing patient in COVID-19 specialty unit  Active Problems: Elevated D-dimer  Clinically, patient's presentation does not seem consistent with acute thromboembolism  D-dimer is modestly elevated  If D-dimer continues to rise or patient fails to clinically improve will obtain CT angiogram of the chest    Hypothyroidism   Continue home regimen of Synthroid    OSA on CPAP   CPAP nightly using the dreamstation CPAP device that can be safely used in Covid patients    Essential hypertension   Continue home regimen of antihypertensive therapy    Eosinophilic asthma   Please see assessment and plan above    NSIP (nonspecific interstitial pneumonitis) ([place])  Please see assessment and plan above    GERD without esophagitis    Continue with daily PPI   Code Status:  Full code Family Communication: deferred   Status is: Observation  The patient remains OBS appropriate and will d/c before 2 midnights.  Dispo: The patient is from: Home              Anticipated d/c is to: Home              Anticipated d/c date is: 2 days              Patient currently is not medically stable to d/c.        Vernelle Emerald MD [facility] Pager [phone]  If 7PM-7AM, please contact night-coverage [email or web address] Use universal [place] password for that web site. If you do not have the password, please call the hospital operator.  09/30/2020, 3:48 AM

## 2020-09-30 NOTE — ED Notes (Signed)
ED TO INPATIENT HANDOFF REPORT  ED Nurse Name and Phone #: [phone]  S Name/Age/Gender Jenny [name] 79 y.o. female Room/Bed: WA15/WA15  Code Status   Code Status: Full Code  Home/SNF/Other Home Patient oriented to: self, place, time and situation Is this baseline? Yes   Triage Complete: Triage complete  Chief Complaint COVID-19 virus infection [U07.1]  Triage Note Pt reports weakness, dizziness, fever, sore throat, and urinary frequency since yesterday. Pt denies pain.    Allergies Allergies  Allergen Reactions  . Prednisone Other (See Comments)    Confusion, dizziness Medrol is ok  . Codeine Nausea Only       . Tramadol Itching    Level of Care/Admitting Diagnosis ED Disposition    ED Disposition Condition Comment   Admit  Hospital Area: [facility] [[id number]]  Level of Care: Telemetry [5]  Admit to tele based on following criteria: Other see comments  Comments: covid , hypoxic respiratory failure  Covid Evaluation: Confirmed COVID Positive  Diagnosis: COVID-19 virus infection [[id number]]  Admitting Physician: Vernelle Emerald [[id number]]  Attending Physician: Vernelle Emerald [[id number]]       B Medical/Surgery History Past Medical History:  Diagnosis Date  . ALLERGIC RHINITIS 08/23/2007  . Allergy    rhinitis  . 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 Anemia 09/21/2011  . Annual physical exam 08/06/2013  . Asthma    mild, intermittent  . Asthma 01/14/2011  . Atrophic vaginitis 06/17/2012  . BACK PAIN, LUMBAR 08/23/2007  . Bronchitis   . BRONCHITIS, RECURRENT 02/22/2008  . Cough    usually not productive  . Degenerative joint disease   . DEGENERATIVE JOINT DISEASE 02/22/2008  . Depression with anxiety 03/06/2008   Qualifier: Diagnosis of  By: Lenna Gilford MD, Deborra Medina   . Diverticulosis of colon   . DIVERTICULOSIS OF COLON 02/22/2008  . Dysthymia   . 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  . elevated glucose 08/21/2008  . GERD 08/23/2007  . GERD  (gastroesophageal reflux disease)   . HAND PAIN 04/09/2010  . History of chicken pox 01/14/2011  . History of measles 01/14/2011  . Hyperglycemia 07/07/2015  . Hypertension   . HYPERTENSION 02/22/2008  . Hypothyroidism   . HYPOTHYROIDISM 08/23/2007  . Incontinence of urine   . Lumbar back pain   . Obesity   . OBESITY 08/23/2007  . Obstructive sleep apnea    does not use CPAP, lost weight  . Other urinary incontinence 03/12/2010  . Peripheral neuropathy 02/12/2011   possibly related to spondylolisthesis per pt  . PONV (postoperative nausea and vomiting)   . Sleep apnea   . SLEEP APNEA, OBSTRUCTIVE 08/23/2007  . Transient memory loss March 2006   after a stressful event she had no memory the rest of that day  . Urinary frequency   . Urinary incontinence   . UTI (lower urinary tract infection) 03/23/2012  . Venous insufficiency   . VENOUS INSUFFICIENCY 08/23/2007  . Vitamin D deficiency   . VITAMIN D DEFICIENCY 08/21/2008   Past Surgical History:  Procedure Laterality Date  . ABDOMINAL HYSTERECTOMY     took one ovary  . AP repair and sling for cystocele  8-04    Dr Gaynelle Arabian  . APPENDECTOMY    . carpal tunel  2008  . CHOLECYSTECTOMY    . ctr     bilateral  . EYE SURGERY    . left hand  2010   left thumb, joint removal, CTR  . NECK SURGERY  x 2 - discectomy, fusion  . SHOULDER ARTHROSCOPY DISTAL CLAVICLE EXCISION AND OPEN ROTATOR CUFF REPAIR     right  . TONSILLECTOMY AND ADENOIDECTOMY    . TUBAL LIGATION       A IV Location/Drains/Wounds Patient Lines/Drains/Airways Status    Active Line/Drains/Airways    Name Placement date Placement time Site Days   Peripheral IV 09/30/20 Left;Posterior Wrist 09/30/20  0556  Wrist  less than 1   Incision 08/19/11 Back Bilateral 08/19/11  0946  -- 3330          Intake/Output Last 24 hours  Intake/Output Summary (Last 24 hours) at 09/30/2020 1452 Last data filed at 09/30/2020 0604 Gross per 24 hour  Intake 294.32 ml  Output  --  Net 294.32 ml    Labs/Imaging Results for orders placed or performed during the hospital encounter of 09/29/20 (from the past 48 hour(s))  Resp Panel by RT-PCR (Flu A&B, Covid) Nasopharyngeal Swab     Status: Abnormal   Collection Time: 09/29/20 10:48 PM   Specimen: Nasopharyngeal Swab; Nasopharyngeal(NP) swabs in vial transport medium  Result Value Ref Range   SARS Coronavirus 2 by RT PCR POSITIVE (A) NEGATIVE    Comment: CRITICAL RESULT CALLED TO, READ BACK BY AND VERIFIED WITH: SARA DOSTER RN  09/29/2020@0228  BY P.HENDERSON (NOTE) SARS-CoV-2 target nucleic acids are DETECTED.  The SARS-CoV-2 RNA is generally detectable in upper respiratory specimens during the acute phase of infection. Positive results are indicative of the presence of the identified virus, but do not rule out bacterial infection or co-infection with other pathogens not detected by the test. Clinical correlation with patient history and other diagnostic information is necessary to determine patient infection status. The expected result is Negative.  Fact Sheet for Patients: EntrepreneurPulse.com.au  Fact Sheet for Healthcare Providers: IncredibleEmployment.be  This test is not yet approved or cleared by the [place] FDA and  has been authorized for detection and/or diagnosis of SARS-CoV-2 by FDA under an Emergency Use Authorization (EUA).  This EUA will remain in effect (me aning this test can be used) for the duration of  the COVID-19 declaration under Section 564(b)(1) of the Act, 21 U.S.C. section 360bbb-3(b)(1), unless the authorization is terminated or revoked sooner.     Influenza A by PCR NEGATIVE NEGATIVE   Influenza B by PCR NEGATIVE NEGATIVE    Comment: (NOTE) The Xpert Xpress SARS-CoV-2/FLU/RSV plus assay is intended as an aid in the diagnosis of influenza from Nasopharyngeal swab specimens and should not be used as a sole basis for treatment. Nasal  washings and aspirates are unacceptable for Xpert Xpress SARS-CoV-2/FLU/RSV testing.  Fact Sheet for Patients: EntrepreneurPulse.com.au  Fact Sheet for Healthcare Providers: IncredibleEmployment.be  This test is not yet approved or cleared by the [place] FDA and has been authorized for detection and/or diagnosis of SARS-CoV-2 by FDA under an Emergency Use Authorization (EUA). This EUA will remain in effect (meaning this test can be used) for the duration of the COVID-19 declaration under Section 564(b)(1) of the Act, 21 U.S.C. section 360bbb-3(b)(1), unless the authorization is terminated or revoked.  Performed at [facility], [place] [address], [place], [place] [address]   C-reactive protein     Status: Abnormal   Collection Time: 09/29/20 10:48 PM  Result Value Ref Range   CRP 8.3 (H) <1.0 mg/dL    Comment: Performed at [facility], [place] [address] [place],  [address]  Comprehensive metabolic panel     Status: Abnormal  Collection Time: 09/29/20 10:48 PM  Result Value Ref Range   Sodium 135 135 - 145 mmol/L   Potassium 4.1 3.5 - 5.1 mmol/L   Chloride 99 98 - 111 mmol/L   CO2 25 22 - 32 mmol/L   Glucose, Bld 149 (H) 70 - 99 mg/dL   BUN 12 8 - 23 mg/dL   Creatinine, Ser 1.01 (H) 0.44 - 1.00 mg/dL   Calcium 8.4 (L) 8.9 - 10.3 mg/dL   Total Protein 6.9 6.5 - 8.1 g/dL   Albumin 3.7 3.5 - 5.0 g/dL   AST 22 15 - 41 U/L   ALT 16 0 - 44 U/L   Alkaline Phosphatase 88 38 - 126 U/L   Total Bilirubin 0.7 0.3 - 1.2 mg/dL   Anion gap 11.0 5 - 15    Comment: Performed at [facility], [place] [address], [place], [place] [address]  CBC with Differential/Platelet     Status: Abnormal   Collection Time: 09/29/20 10:48 PM  Result Value Ref Range   WBC 11.6 (H) 4.0 - 10.5 K/uL   RBC 4.75 3.87 - 5.11 MIL/uL   Hemoglobin 14.1 12.0 - 15.0 g/dL   HCT 44.6 36.0 - 46.0 %   MCV 93.9 80.0 -  100.0 fL   MCH 29.7 26.0 - 34.0 pg   MCHC 31.6 30.0 - 36.0 g/dL   RDW 13.1 11.5 - 15.5 %   Platelets 208 150 - 400 K/uL    Comment: REPEATED TO VERIFY   nRBC 0.0 0.0 - 0.2 %   Neutrophils Relative % 78 %   Neutro Abs 9.3 (H) 1.7 - 7.7 K/uL   Lymphocytes Relative 10 %   Lymphs Abs 1.1 0.7 - 4.0 K/uL   Monocytes Relative 9 %   Monocytes Absolute 1.0 0.1 - 1.0 K/uL   Eosinophils Relative 1 %   Eosinophils Absolute 0.1 0.0 - 0.5 K/uL   Basophils Relative 1 %   Basophils Absolute 0.1 0.0 - 0.1 K/uL   Immature Granulocytes 1 %   Abs Immature Granulocytes 0.06 0.00 - 0.07 K/uL    Comment: Performed at [facility], [place] [address] [place], [place] [address]  D-dimer, quantitative (not at [facility])     Status: Abnormal   Collection Time: 09/29/20 10:48 PM  Result Value Ref Range   D-Dimer, Quant 2.57 (H) 0.00 - 0.50 ug/mL-FEU    Comment: (NOTE) At the manufacturer cut-off value of 0.5 g/mL FEU, this assay has a negative predictive value of 95-100%.This assay is intended for use in conjunction with a clinical pretest probability (PTP) assessment model to exclude pulmonary embolism (PE) and deep venous thrombosis (DVT) in outpatients suspected of PE or DVT. Results should be correlated with clinical presentation. Performed at [facility], [place] [address] [place], [place] [address]   Ferritin     Status: None   Collection Time: 09/29/20 10:48 PM  Result Value Ref Range   Ferritin 124 11 - 307 ng/mL    Comment: Performed at [facility], [place] [address] [place], [place] [address]  Fibrinogen     Status: None   Collection Time: 09/29/20 10:48 PM  Result Value Ref Range   Fibrinogen 347 210 - 475 mg/dL    Comment: Performed at [facility], [place] [address], [place], [place] [address]  Procalcitonin     Status: None   Collection Time: 09/29/20 10:48 PM  Result Value Ref Range   Procalcitonin <0.10  ng/mL     Comment:        Interpretation: PCT (Procalcitonin) <= 0.5 ng/mL: Systemic infection (sepsis) is not likely. Local bacterial infection is possible. (NOTE)       Sepsis PCT Algorithm           Lower Respiratory Tract                                      Infection PCT Algorithm    ----------------------------     ----------------------------         PCT < 0.25 ng/mL                PCT < 0.10 ng/mL          Strongly encourage             Strongly discourage   discontinuation of antibiotics    initiation of antibiotics    ----------------------------     -----------------------------       PCT 0.25 - 0.50 ng/mL            PCT 0.10 - 0.25 ng/mL               OR       >80% decrease in PCT            Discourage initiation of                                            antibiotics      Encourage discontinuation           of antibiotics    ----------------------------     -----------------------------         PCT >= 0.50 ng/mL              PCT 0.26 - 0.50 ng/mL               AND        <80% decrease in PCT             Encourage initiation of                                             antibiotics       Encourage continuation           of antibiotics    ----------------------------     -----------------------------        PCT >= 0.50 ng/mL                  PCT > 0.50 ng/mL               AND         increase in PCT                  Strongly encourage                                      initiation of antibiotics    Strongly encourage escalation           of antibiotics                                     -----------------------------  PCT <= 0.25 ng/mL                                                 OR                                        > 80% decrease in PCT                                      Discontinue / Do not initiate                                             antibiotics  Performed at [place] [address] [place], [place] [address]   Troponin I (High Sensitivity)     Status: None   Collection Time: 09/29/20 10:48 PM  Result Value Ref Range   Troponin I (High Sensitivity) 4 <18 ng/L    Comment: (NOTE) Elevated high sensitivity troponin I (hsTnI) values and significant  changes across serial measurements may suggest ACS but many other  chronic and acute conditions are known to elevate hsTnI results.  Refer to the "Links" section for chest pain algorithms and additional  guidance. Performed at [facility], [place] [address] [place], [place] [address]   Culture, blood (routine x 2)     Status: None (Preliminary result)   Collection Time: 09/29/20 11:04 PM   Specimen: BLOOD  Result Value Ref Range   Specimen Description      BLOOD BLOOD LEFT FOREARM Performed at [place] [address] [place], [place] [address]    Special Requests      BOTTLES DRAWN AEROBIC AND ANAEROBIC Blood Culture adequate volume Performed at [place] [address] [place], [place] [address]    Culture      NO GROWTH < 12 HOURS Performed at [place] [address], [place], [place] [address]    Report Status PENDING   Lactic acid, plasma     Status: None   Collection Time: 09/29/20 11:05 PM  Result Value Ref Range   Lactic Acid, Venous 1.2 0.5 - 1.9 mmol/L    Comment: Performed at [facility], [place] [address], [place], [place] [address]  Culture, blood (routine x 2)     Status: None (Preliminary result)   Collection Time: 09/29/20 11:14 PM   Specimen: BLOOD  Result Value Ref Range   Specimen Description      BLOOD LEFT ARM Performed at [place] [address], [place], [place] [address]    Special Requests      BOTTLES DRAWN AEROBIC AND ANAEROBIC Blood Culture adequate volume Performed at [place] [address], [place], [place] [address]    Culture       NO GROWTH < 12 HOURS Performed at [place] [address] [place],  [address]    Report Status PENDING   Troponin I (High Sensitivity)     Status: None  Collection Time: 09/30/20 12:36 AM  Result Value Ref Range   Troponin I (High Sensitivity) 5 <18 ng/L    Comment: (NOTE) Elevated high sensitivity troponin I (hsTnI) values and significant  changes across serial measurements may suggest ACS but many other  chronic and acute conditions are known to elevate hsTnI results.  Refer to the Links section for chest pain algorithms and additional  guidance. Performed at [facility], [place] [address], [place], [place] [address]   Lactic acid, plasma     Status: None   Collection Time: 09/30/20 12:49 AM  Result Value Ref Range   Lactic Acid, Venous 1.2 0.5 - 1.9 mmol/L    Comment: Performed at [facility], [place] [address], [place], [place] [address]  Urinalysis, Routine w reflex microscopic Urine, Clean Catch     Status: Abnormal   Collection Time: 09/30/20  1:12 AM  Result Value Ref Range   Color, Urine YELLOW YELLOW   APPearance CLEAR CLEAR   Specific Gravity, Urine 1.016 1.005 - 1.030   pH 6.0 5.0 - 8.0   Glucose, UA NEGATIVE NEGATIVE mg/dL   Hgb urine dipstick SMALL (A) NEGATIVE   Bilirubin Urine NEGATIVE NEGATIVE   Ketones, ur 5 (A) NEGATIVE mg/dL   Protein, ur NEGATIVE NEGATIVE mg/dL   Nitrite NEGATIVE NEGATIVE   Leukocytes,Ua NEGATIVE NEGATIVE   RBC / HPF 0-5 0 - 5 RBC/hpf   WBC, UA 0-5 0 - 5 WBC/hpf   Bacteria, UA NONE SEEN NONE SEEN   Squamous Epithelial / LPF 0-5 0 - 5    Comment: Performed at [facility], [place] [address], [place], [place] [address]  C-reactive protein     Status: Abnormal   Collection Time: 09/30/20  6:02 AM  Result Value Ref Range   CRP 10.0 (H) <1.0 mg/dL    Comment: Performed at [facility], [place] [address], [place], [place] [address]  CBC with  Differential/Platelet     Status: Abnormal   Collection Time: 09/30/20  6:02 AM  Result Value Ref Range   WBC 10.4 4.0 - 10.5 K/uL   RBC 4.81 3.87 - 5.11 MIL/uL   Hemoglobin 14.3 12.0 - 15.0 g/dL   HCT 44.3 36.0 - 46.0 %   MCV 92.1 80.0 - 100.0 fL   MCH 29.7 26.0 - 34.0 pg   MCHC 32.3 30.0 - 36.0 g/dL   RDW 13.1 11.5 - 15.5 %   Platelets 230 150 - 400 K/uL   nRBC 0.0 0.0 - 0.2 %   Neutrophils Relative % 90 %   Neutro Abs 9.3 (H) 1.7 - 7.7 K/uL   Lymphocytes Relative 7 %   Lymphs Abs 0.7 0.7 - 4.0 K/uL   Monocytes Relative 2 %   Monocytes Absolute 0.2 0.1 - 1.0 K/uL   Eosinophils Relative 0 %   Eosinophils Absolute 0.0 0.0 - 0.5 K/uL   Basophils Relative 0 %   Basophils Absolute 0.0 0.0 - 0.1 K/uL   Immature Granulocytes 1 %   Abs Immature Granulocytes 0.08 (H) 0.00 - 0.07 K/uL    Comment: Performed at [facility], [place] [address] [place], [place] [address]  Comprehensive metabolic panel     Status: Abnormal   Collection Time: 09/30/20  6:02 AM  Result Value Ref Range   Sodium 136 135 - 145 mmol/L   Potassium 3.7 3.5 - 5.1 mmol/L   Chloride 100 98 - 111 mmol/L   CO2 23 22 -  32 mmol/L   Glucose, Bld 214 (H) 70 - 99 mg/dL    Comment: Glucose reference range applies only to samples taken after fasting for at least 8 hours.   BUN 15 8 - 23 mg/dL   Creatinine, Ser 0.98 0.44 - 1.00 mg/dL   Calcium 8.3 (L) 8.9 - 10.3 mg/dL   Total Protein 6.8 6.5 - 8.1 g/dL   Albumin 3.4 (L) 3.5 - 5.0 g/dL   AST 23 15 - 41 U/L   ALT 16 0 - 44 U/L   Alkaline Phosphatase 84 38 - 126 U/L   Total Bilirubin 0.5 0.3 - 1.2 mg/dL   GFR, Estimated 58 (L) >60 mL/min    Comment: (NOTE) Calculated using the CKD-EPI Creatinine Equation (2021)    Anion gap 13 5 - 15    Comment: Performed at [facility], [place] [address], [place], [place] [address]  D-dimer, quantitative (not at [facility])     Status: Abnormal   Collection Time: 09/30/20  6:02 AM  Result Value Ref Range    D-Dimer, Quant 1.40 (H) 0.00 - 0.50 ug/mL-FEU    Comment: (NOTE) At the manufacturer cut-off value of 0.5 g/mL FEU, this assay has a negative predictive value of 95-100%.This assay is intended for use in conjunction with a clinical pretest probability (PTP) assessment model to exclude pulmonary embolism (PE) and deep venous thrombosis (DVT) in outpatients suspected of PE or DVT. Results should be correlated with clinical presentation. Performed at [facility], [place] [address], [place], [place] [address]   Magnesium     Status: None   Collection Time: 09/30/20  6:02 AM  Result Value Ref Range   Magnesium 2.0 1.7 - 2.4 mg/dL    Comment: Performed at [facility], [place] [address], [place], [place] [address]  Hemoglobin A1c     Status: Abnormal   Collection Time: 09/30/20  6:02 AM  Result Value Ref Range   Hgb A1c MFr Bld 6.2 (H) 4.8 - 5.6 %    Comment: (NOTE) Pre diabetes:          5.7%-6.4%  Diabetes:              >6.4%  Glycemic control for   <7.0% adults with diabetes    Mean Plasma Glucose 131.24 mg/dL    Comment: Performed at [place] [address], [place], [place] [address]  CBG monitoring, ED     Status: Abnormal   Collection Time: 09/30/20  9:20 AM  Result Value Ref Range   Glucose-Capillary 213 (H) 70 - 99 mg/dL    Comment: Glucose reference range applies only to samples taken after fasting for at least 8 hours.  CBG monitoring, ED     Status: Abnormal   Collection Time: 09/30/20  1:10 PM  Result Value Ref Range   Glucose-Capillary 294 (H) 70 - 99 mg/dL    Comment: Glucose reference range applies only to samples taken after fasting for at least 8 hours.   DG Chest Port 1 View  Result Date: 09/29/2020 CLINICAL DATA:  Weakness, dizziness and fever. EXAM: PORTABLE CHEST 1 VIEW COMPARISON:  October 26, 2019 FINDINGS: The heart size and mediastinal contours are within normal limits. Both lungs are clear. The visualized  skeletal structures are unremarkable. IMPRESSION: No active disease. Electronically Signed   By: Virgina Norfolk M.D.   On: 09/29/2020 23:25    Pending Labs Unresulted Labs (From admission, onward)  Start     Ordered   09/30/20 0558  C-reactive protein  Daily,   R      09/30/20 0557   09/30/20 0558  CBC with Differential/Platelet  Daily,   R      09/30/20 0557   09/30/20 0558  Comprehensive metabolic panel  Daily,   R      09/30/20 0557   09/30/20 0558  D-dimer, quantitative (not at [facility])  Daily,   R      09/30/20 0557   09/30/20 0558  Magnesium  Daily,   R      09/30/20 0557   09/29/20 2240  Urine culture  ONCE - STAT,   STAT        09/29/20 2240          Vitals/Pain Today's Vitals   09/30/20 0814 09/30/20 0944 09/30/20 0945 09/30/20 1327  BP: 125/73 107/76  138/76  Pulse: 65 65  74  Resp: 18 20  16   Temp:  97.8 F (36.6 C)    TempSrc:  Oral    SpO2: 100% 98%  97%  Weight:      Height:      PainSc:  0-No pain 0-No pain     Isolation Precautions Airborne and Contact precautions  Medications Medications  amLODipine (NORVASC) tablet 5 mg (5 mg Oral Given 09/30/20 0946)  ALPRAZolam (XANAX) tablet 0.5 mg (has no administration in time range)  escitalopram (LEXAPRO) tablet 5 mg (5 mg Oral Given 09/30/20 0946)  levothyroxine (SYNTHROID) tablet 112 mcg (112 mcg Oral Given 09/30/20 0604)  pantoprazole (PROTONIX) EC tablet 40 mg (40 mg Oral Given 09/30/20 0948)  fluticasone furoate-vilanterol (BREO ELLIPTA) 200-25 MCG/INH 1 puff (1 puff Inhalation Given 09/30/20 1240)  fluticasone (FLONASE) 50 MCG/ACT nasal spray 1 spray (1 spray Each Nare Given 09/30/20 1240)  enoxaparin (LOVENOX) injection 40 mg (40 mg Subcutaneous Given 09/30/20 0948)  remdesivir 200 mg in sodium chloride 0.9% 250 mL IVPB ( Intravenous Stopped 09/30/20 0524)    Followed by  remdesivir 100 mg in sodium chloride 0.9 % 100 mL IVPB (has no administration in time range)  albuterol (VENTOLIN HFA)  108 (90 Base) MCG/ACT inhaler 2 puff (has no administration in time range)  dexamethasone (DECADRON) injection 6 mg (6 mg Intravenous Given 09/30/20 0948)  guaiFENesin-dextromethorphan (ROBITUSSIN DM) 100-10 MG/5ML syrup 10 mL (has no administration in time range)  ascorbic acid (VITAMIN C) tablet 500 mg (500 mg Oral Given 09/30/20 0947)  zinc sulfate capsule 220 mg (220 mg Oral Given 09/30/20 0947)  acetaminophen (TYLENOL) tablet 650 mg (650 mg Oral Given 09/30/20 1326)  polyethylene glycol (MIRALAX / GLYCOLAX) packet 17 g (has no administration in time range)  ondansetron (ZOFRAN) tablet 4 mg (has no administration in time range)    Or  ondansetron (ZOFRAN) injection 4 mg (has no administration in time range)  insulin aspart (novoLOG) injection 0-9 Units (5 Units Subcutaneous Given 09/30/20 1326)  albuterol (VENTOLIN HFA) 108 (90 Base) MCG/ACT inhaler 4 puff (4 puffs Inhalation Given 09/29/20 2315)  methylPREDNISolone sodium succinate (SOLU-MEDROL) 125 mg/2 mL injection 125 mg (125 mg Intravenous Given 09/29/20 2316)  acetaminophen (TYLENOL) tablet 650 mg (650 mg Oral Given 09/29/20 2309)  cefTRIAXone (ROCEPHIN) 2 g in sodium chloride 0.9 % 100 mL IVPB (0 g Intravenous Stopped 09/30/20 0155)    Mobility walks with person assist Low fall risk   Focused Assessments Pulmonary Assessment Handoff:  Lung sounds:   O2 Device: Room Air  R Recommendations: See Admitting Provider Note  Report given to:   Additional Notes:

## 2020-09-30 NOTE — Progress Notes (Signed)
Pt refused CPAP qhs.  Pt states that she no longer uses cpap while sleeping at home and does not wish to use it while in the hospital.  Pt encouraged to contact RT should she change her mind.

## 2020-09-30 NOTE — Telephone Encounter (Signed)
Patient currently in hospital with Covid.  Per office policy Patient has to wait 21 days from positive covid test and hospital admission. Dupixent is here in office and Patient can be scheduled 10/23/19.

## 2020-10-01 ENCOUNTER — Ambulatory Visit: Payer: Medicare HMO

## 2020-10-01 DIAGNOSIS — U071 COVID-19: Secondary | ICD-10-CM | POA: Diagnosis not present

## 2020-10-01 LAB — COMPREHENSIVE METABOLIC PANEL
ALT: 15 U/L (ref 0–44)
AST: 16 U/L (ref 15–41)
Albumin: 3.2 g/dL — ABNORMAL LOW (ref 3.5–5.0)
Alkaline Phosphatase: 73 U/L (ref 38–126)
Anion gap: 8 (ref 5–15)
BUN: 21 mg/dL (ref 8–23)
CO2: 27 mmol/L (ref 22–32)
Calcium: 8.6 mg/dL — ABNORMAL LOW (ref 8.9–10.3)
Chloride: 104 mmol/L (ref 98–111)
Creatinine, Ser: 1.09 mg/dL — ABNORMAL HIGH (ref 0.44–1.00)
GFR, Estimated: 51 mL/min — ABNORMAL LOW (ref >60)
Glucose, Bld: 164 mg/dL — ABNORMAL HIGH (ref 70–99)
Potassium: 4 mmol/L (ref 3.5–5.1)
Sodium: 139 mmol/L (ref 135–145)
Total Bilirubin: 0.3 mg/dL (ref 0.3–1.2)
Total Protein: 6.3 g/dL — ABNORMAL LOW (ref 6.5–8.1)

## 2020-10-01 LAB — CBC WITH DIFFERENTIAL/PLATELET
Abs Immature Granulocytes: 0.05 10*3/uL (ref 0.00–0.07)
Basophils Absolute: 0 10*3/uL (ref 0.0–0.1)
Basophils Relative: 0 %
Eosinophils Absolute: 0 10*3/uL (ref 0.0–0.5)
Eosinophils Relative: 0 %
HCT: 43.9 % (ref 36.0–46.0)
Hemoglobin: 14.2 g/dL (ref 12.0–15.0)
Immature Granulocytes: 0 %
Lymphocytes Relative: 9 %
Lymphs Abs: 1.2 10*3/uL (ref 0.7–4.0)
MCH: 29.8 pg (ref 26.0–34.0)
MCHC: 32.3 g/dL (ref 30.0–36.0)
MCV: 92.2 fL (ref 80.0–100.0)
Monocytes Absolute: 0.8 10*3/uL (ref 0.1–1.0)
Monocytes Relative: 5 %
Neutro Abs: 12.4 10*3/uL — ABNORMAL HIGH (ref 1.7–7.7)
Neutrophils Relative %: 86 %
Platelets: 218 10*3/uL (ref 150–400)
RBC: 4.76 MIL/uL (ref 3.87–5.11)
RDW: 13 % (ref 11.5–15.5)
WBC: 14.5 10*3/uL — ABNORMAL HIGH (ref 4.0–10.5)
nRBC: 0 % (ref 0.0–0.2)

## 2020-10-01 LAB — MAGNESIUM: Magnesium: 2.3 mg/dL (ref 1.7–2.4)

## 2020-10-01 LAB — D-DIMER, QUANTITATIVE: D-Dimer, Quant: 0.84 ug/mL-FEU — ABNORMAL HIGH (ref 0.00–0.50)

## 2020-10-01 LAB — GLUCOSE, CAPILLARY
Glucose-Capillary: 143 mg/dL — ABNORMAL HIGH (ref 70–99)
Glucose-Capillary: 256 mg/dL — ABNORMAL HIGH (ref 70–99)
Glucose-Capillary: 266 mg/dL — ABNORMAL HIGH (ref 70–99)
Glucose-Capillary: 253 mg/dL — ABNORMAL HIGH (ref 70–99)

## 2020-10-01 LAB — C-REACTIVE PROTEIN: CRP: 8.7 mg/dL — ABNORMAL HIGH (ref <1.0)

## 2020-10-01 MED ORDER — VITAMIN D 25 MCG (1000 UNIT) PO TABS
1000.0000 [IU] | ORAL_TABLET | Freq: Every day | ORAL | Status: DC
  Administered 2020-10-01 – 2020-10-04 (×4): 1000 [IU] via ORAL
  Filled 2020-10-01 (×4): qty 1

## 2020-10-01 MED ORDER — PHENOL 1.4 % MT LIQD
1.0000 | OROMUCOSAL | Status: DC | PRN
  Filled 2020-10-01: qty 177

## 2020-10-01 NOTE — Progress Notes (Signed)
SATURATION QUALIFICATIONS: (This note is used to comply with regulatory documentation for home oxygen)  Patient Saturations on Room Air at Rest = 94%  Patient Saturations on Room Air while Ambulating = 90%  Patient Saturations on 2  Liters of oxygen while Ambulating = 94%  Please briefly explain why patient needs home oxygen: 

## 2020-10-01 NOTE — Progress Notes (Signed)
PROGRESS NOTE  Jenny [name] ZOX:[id number] DOB: Apr 19, 1941 DOA: 09/29/2020 PCP: Sheliah Hatch, MD  HPI/Recap of past 79 hours: 79 year old female with past medical history of eosinophilic asthma, NSIP,gastroesophageal reflux disease, hypothyroidism, depression, obstructive sleep apnea who presents to [facility] emergency department with complaints of gradually worsening weakness, nonproductive cough, sore throat, poor oral intake, and fevers of 2 days duration.  Patient presented to [facility] emergency room for evaluation. Upon evaluation in the emergency department, COVID-19 PCR testing has been found to be positive. Patient is also been found to exhibit relative hypoxia on arrival and has been placed on supplemental oxygen via nasal cannula.  TRH, hospitalist team, was asked to admit.    10/01/20:  Seen and examined at bedside.  She reports persistent sore throat.  She denies any chest pain.  Assessment/Plan: Principal Problem:   COVID-19 virus infection Active Problems:   Hypothyroidism   OSA on CPAP   Essential hypertension   GERD without esophagitis   Eosinophilic asthma   NSIP (nonspecific interstitial pneumonitis) (HCC)  COVID-19 viral infection, causing generalized weakness, sore throat, poor oral intake COVID-19 screening test positive on 09/29/2020 On admission she was started on remdesivir IV Decadron, continue Remdesivir day #2 Oxygen saturation 90% on room air Likely will need DME oxygen for DC planning Arrange for remdesivir infusion outpatient for DC planning Continue vitamin C, D3, zinc Continue to mobilize as tolerated  Trend inflammatory markers, they are down trending  Leukocytosis, likely reactive in the setting of IV steroids No evidence of bacterial infection  Hyperglycemia in the setting of IV steroids Hemoglobin A1c 6.2 Continue insulin coverage  Hypothyroidism Continue home levothyroxine  Generalized weakness  likely secondary to COVID-19 viral infection PT assess with no further PT recommendations Continue to mobilize as tolerated with fall precautions Out of bed to chair with every shift Increase oral protein calorie intake   Code Status: Full code  Family Communication: None at bedside  Disposition Plan: Likely discharge to home on 10/02/2020.   Consultants:  None  Procedures:  None  Antimicrobials:  None  DVT prophylaxis: Subcu Lovenox daily  Status is: Inpatient    Dispo:  Patient From: Home  Planned Disposition: Home  Expected discharge date: 10/02/2020  Medically stable for discharge: No, ongoing management of COVID-19 viral infection.         Objective: Vitals:   09/30/20 1539 09/30/20 2039 10/01/20 0534 10/01/20 1238  BP:  128/71 127/73 134/66  Pulse: 71 68 62 64  Resp:  20 15 18   Temp: 98.2 F (36.8 C) 97.9 F (36.6 C) 98.1 F (36.7 C) 98 F (36.7 C)  TempSrc: Oral   Oral  SpO2: 100% 96% 95% 97%  Weight:      Height:        Intake/Output Summary (Last 24 hours) at 10/01/2020 1423 Last data filed at 10/01/2020 0856 Gross per 24 hour  Intake 280 ml  Output 500 ml  Net -220 ml   Filed Weights   09/29/20 2127  Weight: 98 kg    Exam:  . General: 79 y.o. year-old female well developed well nourished in no acute distress.  Alert and oriented x3. . Cardiovascular: Regular rate and rhythm with no rubs or gallops.  No thyromegaly or JVD noted.   Marland Kitchen Respiratory: Mild rales at bases.  No wheezing noted.  Poor inspiratory effort.   . Abdomen: Soft nontender nondistended with normal bowel sounds x4 quadrants. . Musculoskeletal: No lower extremity edema  bilaterally.   Marland Kitchen Psychiatry: Mood is appropriate for condition and setting   Data Reviewed: CBC: Recent Labs  Lab 09/29/20 2248 09/30/20 0602 10/01/20 0338  WBC 11.6* 10.4 14.5*  NEUTROABS 9.3* 9.3* 12.4*  HGB 14.1 14.3 14.2  HCT 44.6 44.3 43.9  MCV 93.9 92.1 92.2  PLT 208 230 [id number]    Basic Metabolic Panel: Recent Labs  Lab 09/29/20 2248 09/30/20 0602 10/01/20 0338  NA 135 136 139  K 4.1 3.7 4.0  CL 99 100 104  CO2 25 23 27   GLUCOSE 149* 214* 164*  BUN 12 15 21   CREATININE 1.01* 0.98 1.09*  CALCIUM 8.4* 8.3* 8.6*  MG  --  2.0 2.3   GFR: Estimated Creatinine Clearance: 46.9 mL/min (A) (by C-G formula based on SCr of 1.09 mg/dL (H)). Liver Function Tests: Recent Labs  Lab 09/29/20 2248 09/30/20 0602 10/01/20 0338  AST 22 23 16   ALT 16 16 15   ALKPHOS 88 84 73  BILITOT 0.7 0.5 0.3  PROT 6.9 6.8 6.3*  ALBUMIN 3.7 3.4* 3.2*   No results for input(s): LIPASE, AMYLASE in the last 168 hours. No results for input(s): AMMONIA in the last 168 hours. Coagulation Profile: No results for input(s): INR, PROTIME in the last 168 hours. Cardiac Enzymes: No results for input(s): CKTOTAL, CKMB, CKMBINDEX, TROPONINI in the last 168 hours. BNP (last 3 results) No results for input(s): PROBNP in the last 8760 hours. HbA1C: Recent Labs    09/30/20 0602  HGBA1C 6.2*   CBG: Recent Labs  Lab 09/30/20 1310 09/30/20 1702 09/30/20 2051 10/01/20 0804 10/01/20 1211  GLUCAP 294* 179* 193* 143* 256*   Lipid Profile: No results for input(s): CHOL, HDL, LDLCALC, TRIG, CHOLHDL, LDLDIRECT in the last 72 hours. Thyroid Function Tests: No results for input(s): TSH, T4TOTAL, FREET4, T3FREE, THYROIDAB in the last 72 hours. Anemia Panel: Recent Labs    09/29/20 2248  FERRITIN 124   Urine analysis:    Component Value Date/Time   COLORURINE YELLOW 09/30/2020 0112   APPEARANCEUR CLEAR 09/30/2020 0112   LABSPEC 1.016 09/30/2020 0112   PHURINE 6.0 09/30/2020 0112   GLUCOSEU NEGATIVE 09/30/2020 0112   HGBUR SMALL (A) 09/30/2020 0112   BILIRUBINUR NEGATIVE 09/30/2020 0112   BILIRUBINUR neg 11/01/2015 0828   KETONESUR 5 (A) 09/30/2020 0112   PROTEINUR NEGATIVE 09/30/2020 0112   UROBILINOGEN 0.2 11/01/2015 0828   UROBILINOGEN 0.2 11/03/2013 1114   NITRITE NEGATIVE  09/30/2020 0112   LEUKOCYTESUR NEGATIVE 09/30/2020 0112   Sepsis Labs: @LABRCNTIP (procalcitonin:4,lacticidven:4)  ) Recent Results (from the past 240 hour(s))  Resp Panel by RT-PCR (Flu A&B, Covid) Nasopharyngeal Swab     Status: Abnormal   Collection Time: 09/29/20 10:48 PM   Specimen: Nasopharyngeal Swab; Nasopharyngeal(NP) swabs in vial transport medium  Result Value Ref Range Status   SARS Coronavirus 2 by RT PCR POSITIVE (A) NEGATIVE Final    Comment: CRITICAL RESULT CALLED TO, READ BACK BY AND VERIFIED WITH: SARA DOSTER RN  09/29/2020@0228  BY P.HENDERSON (NOTE) SARS-CoV-2 target nucleic acids are DETECTED.  The SARS-CoV-2 RNA is generally detectable in upper respiratory specimens during the acute phase of infection. Positive results are indicative of the presence of the identified virus, but do not rule out bacterial infection or co-infection with other pathogens not detected by the test. Clinical correlation with patient history and other diagnostic information is necessary to determine patient infection status. The expected result is Negative.  Fact Sheet for Patients: EntrepreneurPulse.com.au  Fact Sheet for Healthcare Providers: IncredibleEmployment.be  This test is not yet approved or cleared by the [place] and  has been authorized for detection and/or diagnosis of SARS-CoV-2 by FDA under an Emergency Use Authorization (EUA).  This EUA will remain in effect (me aning this test can be used) for the duration of  the COVID-19 declaration under Section 564(b)(1) of the Act, 21 U.S.C. section 360bbb-3(b)(1), unless the authorization is terminated or revoked sooner.     Influenza A by PCR NEGATIVE NEGATIVE Final   Influenza B by PCR NEGATIVE NEGATIVE Final    Comment: (NOTE) The Xpert Xpress SARS-CoV-2/FLU/RSV plus assay is intended as an aid in the diagnosis of influenza from Nasopharyngeal swab specimens and should not  be used as a sole basis for treatment. Nasal washings and aspirates are unacceptable for Xpert Xpress SARS-CoV-2/FLU/RSV testing.  Fact Sheet for Patients: EntrepreneurPulse.com.au  Fact Sheet for Healthcare Providers: IncredibleEmployment.be  This test is not yet approved or cleared by the [place] FDA and has been authorized for detection and/or diagnosis of SARS-CoV-2 by FDA under an Emergency Use Authorization (EUA). This EUA will remain in effect (meaning this test can be used) for the duration of the COVID-19 declaration under Section 564(b)(1) of the Act, 21 U.S.C. section 360bbb-3(b)(1), unless the authorization is terminated or revoked.  Performed at [facility], [place] [address] [place], [place] [address]   Culture, blood (routine x 2)     Status: None (Preliminary result)   Collection Time: 09/29/20 11:04 PM   Specimen: BLOOD  Result Value Ref Range Status   Specimen Description   Final    BLOOD BLOOD LEFT FOREARM Performed at [facility] [address], [place], [place] [address]    Special Requests   Final    BOTTLES DRAWN AEROBIC AND ANAEROBIC Blood Culture adequate volume Performed at [place] [address] [place], [place] [address]    Culture   Final    NO GROWTH 1 DAY Performed at [facility], [place] [address], [place], [place] [address]    Report Status PENDING  Incomplete  Culture, blood (routine x 2)     Status: None (Preliminary result)   Collection Time: 09/29/20 11:14 PM   Specimen: BLOOD  Result Value Ref Range Status   Specimen Description   Final    BLOOD LEFT ARM Performed at [facility] [address], [place], [place] [address]    Special Requests   Final    BOTTLES DRAWN AEROBIC AND ANAEROBIC Blood Culture adequate volume Performed at [facility] [address], [place], [place]  [address]    Culture   Final    NO GROWTH 1 DAY Performed at [facility], [place] [address], [place], [place] [address]    Report Status PENDING  Incomplete  Urine culture     Status: Abnormal   Collection Time: 09/30/20  1:12 AM   Specimen: Urine, Clean Catch  Result Value Ref Range Status   Specimen Description   Final    URINE, CLEAN CATCH Performed at [facility], [place] [address] [place],  [address]    Special Requests   Final    NONE Performed at [facility], [place] [address] [place],  [address]    Culture (A)  Final    <10,000 COLONIES/mL INSIGNIFICANT GROWTH Performed at [facility] [address] [place],  [address]    Report Status  09/30/2020 FINAL  Final      Studies: No results found.  Scheduled Meds: . amLODipine  5 mg Oral Daily  . vitamin C  500 mg Oral Daily  . cholecalciferol  1,000 Units Oral Daily  . dexamethasone (DECADRON) injection  6 mg Intravenous Q24H  . enoxaparin (LOVENOX) injection  40 mg Subcutaneous Q24H  . escitalopram  5 mg Oral Daily  . fluticasone  1 spray Each Nare Daily  . fluticasone furoate-vilanterol  1 puff Inhalation Daily  . insulin aspart  0-9 Units Subcutaneous TID WC  . levothyroxine  112 mcg Oral Q0600  . pantoprazole  40 mg Oral Daily  . zinc sulfate  220 mg Oral Daily    Continuous Infusions: . remdesivir 100 mg in NS 100 mL 100 mg (10/01/20 1050)     LOS: 1 day     Kayleen Memos, MD [facility] Pager [phone]  If 7PM-7AM, please contact night-coverage [email or web address] Password [facility] 10/01/2020, 2:23 PM

## 2020-10-01 NOTE — Evaluation (Signed)
Physical Therapy Evaluation Patient Details Name: Jenny [name] MRN: [id number] DOB: [birth year] Today's Date: 10/01/2020   History of Present Illness  79 year old female with past medical history of eosinophilic asthma, NSIP, gastroesophageal reflux disease, hypothyroidism, depression, obstructive sleep apnea who presents to [facility] emergency department with complaints of weakness cough and fevers.Patient eventually presented to [facility] emergency room for evaluation. Upon evaluation in the emergency department, COVID-19 PCR testing has been found to be positive. Patient is also was  Hypoxic.  Clinical Impression    patient ambulated in room on RA x 40' x 2 on RA with SPO2 > 90%. Left on RA at 94 %. Patient  Should progress to DC  Home. Patient reports that she may stay with a daughter.  Pt admitted with above diagnosis.  Pt currently with functional limitations due to the deficits listed below (see PT Problem List). Pt will benefit from skilled PT to increase their independence and safety with mobility to allow discharge to the venue listed below.       Follow Up Recommendations No PT follow up    Equipment Recommendations  None recommended by PT    Recommendations for Other Services       Precautions / Restrictions Precautions Precaution Comments: monitor sats      Mobility  Bed Mobility               General bed mobility comments: in recliner    Transfers Overall transfer level: Needs assistance Equipment used: None Transfers: Sit to/from Stand Sit to Stand: Supervision            Ambulation/Gait Ambulation/Gait assistance: Min guard Gait Distance (Feet): 40 Feet (x 2) Assistive device: IV Pole;None Gait Pattern/deviations: Step-through pattern Gait velocity: decr   General Gait Details: gait is slow , slightly unsteady without UE support.  Stairs            Wheelchair Mobility    Modified Rankin (Stroke Patients  Only)       Balance Overall balance assessment: Mild deficits observed, not formally tested                                           Pertinent Vitals/Pain Pain Assessment: No/denies pain    Home Living Family/patient expects to be discharged to:: Private residence Living Arrangements: Children;Other relatives Available Help at Discharge: Family Type of Home: House Home Access: Stairs to enter;Ramped entrance     Home Layout: One level Home Equipment: Environmental consultant - 2 wheels;Cane - single point;Bedside commode Additional Comments: may stay with a daughter.    Prior Function Level of Independence: Independent               Hand Dominance   Dominant Hand: Right    Extremity/Trunk Assessment   Upper Extremity Assessment Upper Extremity Assessment: Overall WFL for tasks assessed    Lower Extremity Assessment Lower Extremity Assessment: Generalized weakness    Cervical / Trunk Assessment Cervical / Trunk Assessment: Normal  Communication   Communication: No difficulties;HOH  Cognition Arousal/Alertness: Awake/alert Behavior During Therapy: WFL for tasks assessed/performed Overall Cognitive Status: Within Functional Limits for tasks assessed                                        General  Comments      Exercises     Assessment/Plan    PT Assessment Patient needs continued PT services  PT Problem List Decreased mobility;Decreased safety awareness;Decreased activity tolerance;Decreased knowledge of precautions       PT Treatment Interventions DME instruction;Therapeutic activities;Gait training;Patient/family education;Therapeutic exercise;Functional mobility training    PT Goals (Current goals can be found in the Care Plan section)  Acute Rehab PT Goals Patient Stated Goal: to go home PT Goal Formulation: With patient Time For Goal Achievement: 10/14/20 Potential to Achieve Goals: Good    Frequency Min 3X/week    Barriers to discharge        Co-evaluation               AM-PAC PT "6 Clicks" Mobility  Outcome Measure Help needed turning from your back to your side while in a flat bed without using bedrails?: None Help needed moving from lying on your back to sitting on the side of a flat bed without using bedrails?: None Help needed moving to and from a bed to a chair (including a wheelchair)?: A Little Help needed standing up from a chair using your arms (e.g., wheelchair or bedside chair)?: A Little Help needed to walk in hospital room?: A Little Help needed climbing 3-5 steps with a railing? : A Little 6 Click Score: 20    End of Session   Activity Tolerance: Patient tolerated treatment well Patient left: in chair;with call bell/phone within reach Nurse Communication: Mobility status PT Visit Diagnosis: Unsteadiness on feet (R26.81)    Time: 4403-4742 PT Time Calculation (min) (ACUTE ONLY): 31 min   Charges:   PT Evaluation $PT Eval Low Complexity: 1 Low PT Treatments $Gait Training: 8-22 mins        [place] Pager [phone] Office [phone]   Claretha Cooper 10/01/2020, 1:40 PM

## 2020-10-01 NOTE — Progress Notes (Signed)
Patient declines nocturnal CPAP 

## 2020-10-02 DIAGNOSIS — U071 COVID-19: Secondary | ICD-10-CM | POA: Diagnosis not present

## 2020-10-02 LAB — CBC WITH DIFFERENTIAL/PLATELET
Abs Immature Granulocytes: 0.05 10*3/uL (ref 0.00–0.07)
Basophils Absolute: 0 10*3/uL (ref 0.0–0.1)
Basophils Relative: 0 %
Eosinophils Absolute: 0 10*3/uL (ref 0.0–0.5)
Eosinophils Relative: 0 %
HCT: 43.1 % (ref 36.0–46.0)
Hemoglobin: 13.5 g/dL (ref 12.0–15.0)
Immature Granulocytes: 0 %
Lymphocytes Relative: 13 %
Lymphs Abs: 1.5 10*3/uL (ref 0.7–4.0)
MCH: 29.8 pg (ref 26.0–34.0)
MCHC: 31.3 g/dL (ref 30.0–36.0)
MCV: 95.1 fL (ref 80.0–100.0)
Monocytes Absolute: 0.6 10*3/uL (ref 0.1–1.0)
Monocytes Relative: 5 %
Neutro Abs: 9.4 10*3/uL — ABNORMAL HIGH (ref 1.7–7.7)
Neutrophils Relative %: 82 %
Platelets: 211 10*3/uL (ref 150–400)
RBC: 4.53 MIL/uL (ref 3.87–5.11)
RDW: 13.2 % (ref 11.5–15.5)
WBC: 11.5 10*3/uL — ABNORMAL HIGH (ref 4.0–10.5)
nRBC: 0 % (ref 0.0–0.2)

## 2020-10-02 LAB — COMPREHENSIVE METABOLIC PANEL
ALT: 13 U/L (ref 0–44)
AST: 14 U/L — ABNORMAL LOW (ref 15–41)
Albumin: 3 g/dL — ABNORMAL LOW (ref 3.5–5.0)
Alkaline Phosphatase: 67 U/L (ref 38–126)
Anion gap: 10 (ref 5–15)
BUN: 23 mg/dL (ref 8–23)
CO2: 24 mmol/L (ref 22–32)
Calcium: 8.3 mg/dL — ABNORMAL LOW (ref 8.9–10.3)
Chloride: 104 mmol/L (ref 98–111)
Creatinine, Ser: 0.79 mg/dL (ref 0.44–1.00)
GFR, Estimated: >60 mL/min (ref >60)
Glucose, Bld: 133 mg/dL — ABNORMAL HIGH (ref 70–99)
Potassium: 4.2 mmol/L (ref 3.5–5.1)
Sodium: 138 mmol/L (ref 135–145)
Total Bilirubin: 0.5 mg/dL (ref 0.3–1.2)
Total Protein: 5.9 g/dL — ABNORMAL LOW (ref 6.5–8.1)

## 2020-10-02 LAB — GLUCOSE, CAPILLARY
Glucose-Capillary: 105 mg/dL — ABNORMAL HIGH (ref 70–99)
Glucose-Capillary: 294 mg/dL — ABNORMAL HIGH (ref 70–99)
Glucose-Capillary: 306 mg/dL — ABNORMAL HIGH (ref 70–99)
Glucose-Capillary: 283 mg/dL — ABNORMAL HIGH (ref 70–99)

## 2020-10-02 LAB — D-DIMER, QUANTITATIVE: D-Dimer, Quant: 0.44 ug/mL-FEU (ref 0.00–0.50)

## 2020-10-02 LAB — C-REACTIVE PROTEIN: CRP: 2.7 mg/dL — ABNORMAL HIGH (ref <1.0)

## 2020-10-02 LAB — MAGNESIUM: Magnesium: 2.3 mg/dL (ref 1.7–2.4)

## 2020-10-02 NOTE — TOC Transition Note (Signed)
Transition of Care [facility]) - CM/SW Discharge Note   Patient Details  Name: Jenny [name] MRN: [id number] Date of Birth: [birth year]  [facility] CM/SW Contact:  Trish Mage, LCSW Phone Number: 10/02/2020, 11:29 AM   Clinical Narrative:   Patient seen in follow up to transportation concerns.  Ms Cuadra states she does not drive, her daughter works during the day, and she needs a ride to the [facility].  I assured her we would let them know she needs help, and they would arrange that for her.  When asked about transportation home, she states her daughter works here in the lab, and will take her home at the end of her shift around 5PM.  No further needs identified.  TOC sign off.    Final next level of care: Home/Self Care Barriers to Discharge: No Barriers Identified   Patient Goals and CMS Choice        Discharge Placement                       Discharge Plan and Services                                     Social Determinants of Health (SDOH) Interventions     Readmission Risk Interventions No flowsheet data found.

## 2020-10-02 NOTE — Progress Notes (Signed)
Pt states that she no longer wears cpap at home and does not want to wear one while here in the hospital.  Pt encouraged to call should she change her mind.

## 2020-10-02 NOTE — Evaluation (Signed)
Occupational Therapy Evaluation Patient Details Name: Jenny [name] MRN: [id number] DOB: 1941/03/07 Today's Date: 10/02/2020    History of Present Illness 79 year old female with past medical history of eosinophilic asthma, NSIP, gastroesophageal reflux disease, hypothyroidism, depression, obstructive sleep apnea who presents to [facility] emergency department with complaints of weakness cough and fevers.Patient eventually presented to [facility] emergency room for evaluation. Upon evaluation in the emergency department, COVID-19 PCR testing has been found to be positive. Patient is also been found to exhibit relative hypoxia   Clinical Impression   Jenny [name] is an 79 year old woman who reports active lifestyle and independence at baseline. On evaluation patient presents on RA and demonstrated normal RIOM and strength of upper extremities, ability to perform ADLs and ambulate in room with assistance. O2 sats maintained above 90% with in room ambulation. Patient does not report shortness of breath or significant fatigue. She reports ambulating to bathroom without nursing staff. Therapist recommended use of shower chair at home for safety and therapist verbalized understanding. Patient has BSC and RW at home if needed. No further OT needs at this time.     Follow Up Recommendations  No OT follow up    Equipment Recommendations  None recommended by OT    Recommendations for Other Services       Precautions / Restrictions Precautions Precautions: None Precaution Comments: monitor sats      Mobility Bed Mobility               General bed mobility comments: in recliner    Transfers Overall transfer level: Modified independent               General transfer comment: Patient ambulating in room without assistance.    Balance Overall balance assessment: No apparent balance deficits (not formally assessed)                                          ADL either performed or assessed with clinical judgement   ADL Overall ADL's : At baseline                                       General ADL Comments: Patient reports ambulating to bathroom and performing toileting without nursing assistance. Demonstrated ability to don socks.     Vision Patient Visual Report: No change from baseline       Perception     Praxis      Pertinent Vitals/Pain Pain Assessment: No/denies pain     Hand Dominance Right   Extremity/Trunk Assessment Upper Extremity Assessment Upper Extremity Assessment: Overall WFL for tasks assessed   Lower Extremity Assessment Lower Extremity Assessment: Defer to PT evaluation   Cervical / Trunk Assessment Cervical / Trunk Assessment: Normal   Communication Communication Communication: No difficulties;HOH   Cognition Arousal/Alertness: Awake/alert Behavior During Therapy: WFL for tasks assessed/performed Overall Cognitive Status: Within Functional Limits for tasks assessed                                     General Comments       Exercises     Shoulder Instructions      Home Living Family/patient expects to be discharged to::  Private residence Living Arrangements: Children;Other relatives Available Help at Discharge: Family Type of Home: House Home Access: Stairs to enter;Ramped entrance     Home Layout: One level     Bathroom Shower/Tub: Tub/shower unit;Curtain   Bathroom Toilet: Handicapped height     Home Equipment: Environmental consultant - 2 wheels;Cane - single point;Bedside commode;Shower seat   Additional Comments: may stay with a daughter.      Prior Functioning/Environment Level of Independence: Independent                 OT Problem List: Cardiopulmonary status limiting activity      OT Treatment/Interventions:      OT Goals(Current goals can be found in the care plan section) Acute Rehab OT Goals OT Goal Formulation: All  assessment and education complete, DC therapy  OT Frequency:     Barriers to D/C:            Co-evaluation              AM-PAC OT "6 Clicks" Daily Activity     Outcome Measure Help from another person eating meals?: None Help from another person taking care of personal grooming?: None Help from another person toileting, which includes using toliet, bedpan, or urinal?: None Help from another person bathing (including washing, rinsing, drying)?: None Help from another person to put on and taking off regular upper body clothing?: None Help from another person to put on and taking off regular lower body clothing?: None 6 Click Score: 24   End of Session Nurse Communication:  (okay to see per rn)  Activity Tolerance: Patient tolerated treatment well Patient left: in chair;with call bell/phone within reach  OT Visit Diagnosis: Muscle weakness (generalized) (M62.81)                Time: [id number] OT Time Calculation (min): 9 min Charges:  OT General Charges $OT Visit: 1 Visit OT Evaluation $OT Eval Low Complexity: 1 Low  Kelden Lavallee, OTR/L [facility]  Office [phone] Pager: [phone]   Lenward Chancellor 10/02/2020, 4:18 PM

## 2020-10-02 NOTE — Progress Notes (Signed)
[facility] Progress Note  Patient: Jenny [name]    UXN:[id number]  DOA: 09/29/2020     Date of Service: the patient was seen and examined on 10/02/2020  Brief hospital course: Past medical history of eosinophilic asthma, NSIP, GERD, hypothyroidism, depression, OSA.  Presents with complaints of shortness of breath cough and fever.  Found to have COVID-19 pneumonia. Currently plan is continue to complete the course for IV remdesivir.  Assessment and Plan: 1. Acute COVID-19 Viral Pneumonia CXR: hazy bilateral peripheral opacities Oxygen requirement: room air at rest and exertion.  CRP: 2.7 Remdesivir: Still needs 2 more dose and does not have ride to come to infusion center. Steroids: on dexamethasone Baricitinib/Actemra(off-label use): not indicated.   The investigational nature of this medication was discussed with the patient/HCPOA and they choose to proceed as the potential benefits are felt to outweigh risks at this time.  Antibiotics:not indicated Vitamin C and Zinc: not indicated DVT Prophylaxis: enoxaparin (LOVENOX) injection 40 mg Start: 09/30/20 1000 Prone positioning and incentive spirometer use recommended.  Overall plan: none  The treatment plan and use of medications and known side effects were discussed with patient/family. It was clearly explained that Complete risks and long-term side effects are unknown, however in the best clinical judgment they seem to be of some clinical benefit rather than medical risks. Patient/family agree with the treatment plan and want to receive these treatments as indicated.  2. Hypothyroidism Continue home levothyroxine  3. Hyperglycemia in the setting of IV steroids Hemoglobin A1c 6.2 Continue insulin coverage  4. Generalized weakness likely secondary to COVID-19 viral infection PT assess with no further PT recommendations. Continue to mobilize as tolerated with fall precautions. Out of bed to chair with every  shift. Increase oral protein calorie intake.  5. Essential hypertension Blood pressure is elevated.   6. GERD PPI  7. Depression Continue Lexapro.   8. Obesity Body mass index is 35.94 kg/m.   Diet: low salt diet DVT Prophylaxis:  enoxaparin (LOVENOX) injection 40 mg Start: 09/30/20 1000  Advance goals of care discussion: Full code  Family Communication: no family was present at bedside, at the time of interview.   Disposition:  Status is: Inpatient  Remains inpatient appropriate because:IV treatments appropriate due to intensity of illness or inability to take PO and Inpatient level of care appropriate due to severity of illness  Dispo:  Patient From: Home  Planned Disposition: Home  Expected discharge date: 10/05/2020  Medically stable for discharge: No  Subjective: continue to have shortness of breath, no chest pain, no fever. No dizziness.   Physical Exam:  General: Appear in mild distress, no Rash; Oral Mucosa Clear, moist. no Abnormal Neck Mass Or lumps, Conjunctiva normal  Cardiovascular: S1 and S2 Present, no Murmur, Respiratory: good respiratory effort, Bilateral Air entry present and bilateral Crackles, no wheezes Abdomen: Bowel Sound present, Soft and no tenderness Extremities: no Pedal edema Neurology: alert and oriented to time, place, and person affect appropriate. no new focal deficit Gait not checked due to patient safety concerns  Vitals:   10/01/20 2010 10/02/20 0554 10/02/20 1437 10/02/20 1443  BP: 133/68 (!) 150/72  (!) 150/88  Pulse: 60 67 92 64  Resp: 19 18  18   Temp: 97.6 F (36.4 C) 98.1 F (36.7 C)  97.7 F (36.5 C)  TempSrc:  Oral  Oral  SpO2: 96% 95% 98% 98%  Weight:      Height:        Intake/Output Summary (Last 24 hours)  at 10/02/2020 1901 Last data filed at 10/01/2020 2115 Gross per 24 hour  Intake 100 ml  Output --  Net 100 ml   Filed Weights   09/29/20 2127  Weight: 98 kg   Data Reviewed: I have personally  reviewed and interpreted daily labs, tele strips, imagings as discussed above. I reviewed all nursing notes, pharmacy notes, vitals, pertinent old records I have discussed plan of care as described above with RN and patient/family.  CBC: Recent Labs  Lab 09/29/20 2248 09/30/20 0602 10/01/20 0338 10/02/20 0437  WBC 11.6* 10.4 14.5* 11.5*  NEUTROABS 9.3* 9.3* 12.4* 9.4*  HGB 14.1 14.3 14.2 13.5  HCT 44.6 44.3 43.9 43.1  MCV 93.9 92.1 92.2 95.1  PLT 208 230 218 [id number]   Basic Metabolic Panel: Recent Labs  Lab 09/29/20 2248 09/30/20 0602 10/01/20 0338 10/02/20 0437  NA 135 136 139 138  K 4.1 3.7 4.0 4.2  CL 99 100 104 104  CO2 25 23 27 24   GLUCOSE 149* 214* 164* 133*  BUN 12 15 21 23   CREATININE 1.01* 0.98 1.09* 0.79  CALCIUM 8.4* 8.3* 8.6* 8.3*  MG  --  2.0 2.3 2.3   Studies: No results found.  Scheduled Meds: . amLODipine  5 mg Oral Daily  . vitamin C  500 mg Oral Daily  . cholecalciferol  1,000 Units Oral Daily  . dexamethasone (DECADRON) injection  6 mg Intravenous Q24H  . enoxaparin (LOVENOX) injection  40 mg Subcutaneous Q24H  . escitalopram  5 mg Oral Daily  . fluticasone  1 spray Each Nare Daily  . fluticasone furoate-vilanterol  1 puff Inhalation Daily  . insulin aspart  0-9 Units Subcutaneous TID WC  . levothyroxine  112 mcg Oral Q0600  . pantoprazole  40 mg Oral Daily  . zinc sulfate  220 mg Oral Daily   Continuous Infusions: . remdesivir 100 mg in NS 100 mL 100 mg (10/02/20 0845)   PRN Meds: acetaminophen, albuterol, ALPRAZolam, guaiFENesin-dextromethorphan, ondansetron **OR** ondansetron (ZOFRAN) IV, phenol, polyethylene glycol  Time spent: 35 minutes  Author: Berle Mull, MD [facility] 10/02/2020 7:01 PM  To reach On-call, see care teams to locate the attending and reach out via [email or web address]. Between 7PM-7AM, please contact night-coverage If you still have difficulty reaching the attending provider, please page the [facility] (Director on  Call) for [facility] on amion for assistance.

## 2020-10-03 ENCOUNTER — Ambulatory Visit (HOSPITAL_COMMUNITY): Payer: Medicare HMO

## 2020-10-03 DIAGNOSIS — U071 COVID-19: Secondary | ICD-10-CM | POA: Diagnosis not present

## 2020-10-03 LAB — COMPREHENSIVE METABOLIC PANEL WITH GFR
ALT: 26 U/L (ref 0–44)
AST: 31 U/L (ref 15–41)
Albumin: 3.6 g/dL (ref 3.5–5.0)
Alkaline Phosphatase: 70 U/L (ref 38–126)
Anion gap: 9 (ref 5–15)
BUN: 25 mg/dL — ABNORMAL HIGH (ref 8–23)
CO2: 28 mmol/L (ref 22–32)
Calcium: 8.9 mg/dL (ref 8.9–10.3)
Chloride: 102 mmol/L (ref 98–111)
Creatinine, Ser: 0.83 mg/dL (ref 0.44–1.00)
GFR, Estimated: >60 mL/min
Glucose, Bld: 144 mg/dL — ABNORMAL HIGH (ref 70–99)
Potassium: 4.5 mmol/L (ref 3.5–5.1)
Sodium: 139 mmol/L (ref 135–145)
Total Bilirubin: 0.5 mg/dL (ref 0.3–1.2)
Total Protein: 6.7 g/dL (ref 6.5–8.1)

## 2020-10-03 LAB — C-REACTIVE PROTEIN: CRP: 0.9 mg/dL

## 2020-10-03 LAB — CBC WITH DIFFERENTIAL/PLATELET
Abs Immature Granulocytes: 0.08 K/uL — ABNORMAL HIGH (ref 0.00–0.07)
Basophils Absolute: 0 K/uL (ref 0.0–0.1)
Basophils Relative: 0 %
Eosinophils Absolute: 0 K/uL (ref 0.0–0.5)
Eosinophils Relative: 0 %
HCT: 44.6 % (ref 36.0–46.0)
Hemoglobin: 14 g/dL (ref 12.0–15.0)
Immature Granulocytes: 1 %
Lymphocytes Relative: 21 %
Lymphs Abs: 1.9 K/uL (ref 0.7–4.0)
MCH: 29.2 pg (ref 26.0–34.0)
MCHC: 31.4 g/dL (ref 30.0–36.0)
MCV: 92.9 fL (ref 80.0–100.0)
Monocytes Absolute: 0.7 K/uL (ref 0.1–1.0)
Monocytes Relative: 7 %
Neutro Abs: 6.5 K/uL (ref 1.7–7.7)
Neutrophils Relative %: 71 %
Platelets: 263 K/uL (ref 150–400)
RBC: 4.8 MIL/uL (ref 3.87–5.11)
RDW: 13 % (ref 11.5–15.5)
WBC: 9.1 K/uL (ref 4.0–10.5)
nRBC: 0 % (ref 0.0–0.2)

## 2020-10-03 LAB — MAGNESIUM: Magnesium: 2.3 mg/dL (ref 1.7–2.4)

## 2020-10-03 LAB — GLUCOSE, CAPILLARY
Glucose-Capillary: 137 mg/dL — ABNORMAL HIGH (ref 70–99)
Glucose-Capillary: 237 mg/dL — ABNORMAL HIGH (ref 70–99)
Glucose-Capillary: 280 mg/dL — ABNORMAL HIGH (ref 70–99)
Glucose-Capillary: 299 mg/dL — ABNORMAL HIGH (ref 70–99)

## 2020-10-03 LAB — D-DIMER, QUANTITATIVE: D-Dimer, Quant: 0.41 ug{FEU}/mL (ref 0.00–0.50)

## 2020-10-03 MED ORDER — FUROSEMIDE 40 MG PO TABS
40.0000 mg | ORAL_TABLET | Freq: Every day | ORAL | Status: DC
  Administered 2020-10-03 – 2020-10-04 (×2): 40 mg via ORAL
  Filled 2020-10-03 (×2): qty 1

## 2020-10-03 MED ORDER — INSULIN ASPART 100 UNIT/ML ~~LOC~~ SOLN
0.0000 [IU] | Freq: Three times a day (TID) | SUBCUTANEOUS | Status: DC
  Administered 2020-10-03: 23:00:00 5 [IU] via SUBCUTANEOUS
  Administered 2020-10-04: 12:00:00 3 [IU] via SUBCUTANEOUS
  Administered 2020-10-04: 09:00:00 1 [IU] via SUBCUTANEOUS

## 2020-10-03 NOTE — Care Management Important Message (Signed)
Important Message  Patient Details IM Letter given to the Patient. Name: Jenny [name] MRN: [id number] Date of Birth: Jun 09, 1941   Medicare Important Message Given:  Yes     Kerin Salen 10/03/2020, 12:10 PM

## 2020-10-03 NOTE — Progress Notes (Signed)
Physical Therapy Treatment Patient Details Name: Jenny [name] MRN: [id number] DOB: 1941/08/12 Today's Date: 10/03/2020    History of Present Illness 79 year old female with past medical history of eosinophilic asthma, NSIP, gastroesophageal reflux disease, hypothyroidism, depression, obstructive sleep apnea who presents to [facility] emergency department with complaints of weakness cough and fevers.Patient eventually presented to [facility] emergency room for evaluation. Upon evaluation in the emergency department, COVID-19 PCR testing has been found to be positive. Patient is also been found to exhibit relative hypoxia    PT Comments    Patient reports up ad lib in room. Provided HEP and theraband , instructed in U and LE exercises. Patient plans  DC tomorrow.   Follow Up Recommendations  No PT follow up     Equipment Recommendations  None recommended by PT    Recommendations for Other Services       Precautions / Restrictions Precautions Precautions: None    Mobility  Bed Mobility Overal bed mobility: Modified Independent                Transfers Overall transfer level: Modified independent                  Ambulation/Gait Ambulation/Gait assistance: Modified independent (Device/Increase time)           General Gait Details: up ad lib in room per pt., not observed   Stairs             Wheelchair Mobility    Modified Rankin (Stroke Patients Only)       Balance                                            Cognition Arousal/Alertness: Awake/alert                                            Exercises Other Exercises Other Exercises: provided HEP with UE's using orange Tb, LE standing  exercises, patient to start with 10 each.    General Comments        Pertinent Vitals/Pain Pain Assessment: No/denies pain    Home Living                      Prior Function             PT Goals (current goals can now be found in the care plan section) Progress towards PT goals: Progressing toward goals    Frequency    Min 3X/week      PT Plan Current plan remains appropriate    Co-evaluation              AM-PAC PT "6 Clicks" Mobility   Outcome Measure  Help needed turning from your back to your side while in a flat bed without using bedrails?: None Help needed moving from lying on your back to sitting on the side of a flat bed without using bedrails?: None Help needed moving to and from a bed to a chair (including a wheelchair)?: None Help needed standing up from a chair using your arms (e.g., wheelchair or bedside chair)?: None Help needed to walk in hospital room?: None Help needed climbing 3-5 steps with a railing? : A Little 6 Click Score:  23    End of Session   Activity Tolerance: Patient tolerated treatment well Patient left: in chair;with call bell/phone within reach Nurse Communication: Mobility status PT Visit Diagnosis: Unsteadiness on feet (R26.81)     Time: 5397-6734 PT Time Calculation (min) (ACUTE ONLY): 22 min  Charges:  $Therapeutic Exercise: 8-22 mins                     Tresa Endo PT Acute Rehabilitation Services Pager [phone] Office [phone]    Claretha Cooper 10/03/2020, 5:05 PM

## 2020-10-03 NOTE — Progress Notes (Signed)
[facility] Progress Note  Patient: Jenny [name]    [id number]  DOA: 09/29/2020     Date of Service: the patient was seen and examined on 10/03/2020  Brief hospital course: Past medical history of eosinophilic asthma, NSIP, GERD, hypothyroidism, depression, OSA.  Presents with complaints of shortness of breath cough and fever.  Found to have COVID-19 pneumonia. Currently plan is continue to complete the course for IV remdesivir.  Assessment and Plan: 1. Acute COVID-19 Viral Pneumonia CXR: hazy bilateral peripheral opacities Oxygen requirement: room air at rest and exertion.  CRP: 0.9 Remdesivir: Day 4.  Last dose tomorrow. Steroids: on dexamethasone Baricitinib/Actemra(off-label use): not indicated.   The investigational nature of this medication was discussed with the patient/HCPOA and they choose to proceed as the potential benefits are felt to outweigh risks at this time.  Antibiotics:not indicated Vitamin C and Zinc: not indicated DVT Prophylaxis: enoxaparin (LOVENOX) injection 40 mg Start: 09/30/20 1000 Prone positioning and incentive spirometer use recommended.  Overall plan: none  The treatment plan and use of medications and known side effects were discussed with patient/family. It was clearly explained that Complete risks and long-term side effects are unknown, however in the best clinical judgment they seem to be of some clinical benefit rather than medical risks. Patient/family agree with the treatment plan and want to receive these treatments as indicated.  2. Hypothyroidism Continue home levothyroxine  3. Hyperglycemia in the setting of IV steroids Hemoglobin A1c 6.2 Continue insulin coverage  4. Generalized weakness likely secondary to COVID-19 viral infection PT assess with no further PT recommendations. Continue to mobilize as tolerated with fall precautions. Out of bed to chair with every shift. Increase oral protein calorie intake.  5.  Essential hypertension Blood pressure is elevated.   6. GERD PPI  7. Depression Continue Lexapro.   8. Obesity Body mass index is 35.94 kg/m.   Diet: low salt diet DVT Prophylaxis:  enoxaparin (LOVENOX) injection 40 mg Start: 09/30/20 1000  Advance goals of care discussion: Full code  Family Communication: no family was present at bedside, at the time of interview.   Disposition:  Status is: Inpatient  Remains inpatient appropriate because: Needs IV medication.  Unable to go home as does not have any ride or transportation to the infusion center which is also not working over the holidays.  Dispo:  Patient From: Home  Planned Disposition: Home  Expected discharge date: 10/05/2020  Medically stable for discharge: No  Subjective: Still has headache.  No nausea no vomiting.  Breathing still heavy.  Cough improving.  Physical Exam:  General: Appear in mild distress, no Rash; Oral Mucosa Clear, moist. no Abnormal Neck Mass Or lumps, Conjunctiva normal  Cardiovascular: S1 and S2 Present, no Murmur, Respiratory: good respiratory effort, Bilateral Air entry present and bilateral Crackles, no wheezes Abdomen: Bowel Sound present, Soft and no tenderness Extremities: no Pedal edema Neurology: alert and oriented to time, place, and person affect appropriate. no new focal deficit Gait not checked due to patient safety concerns  Vitals:   10/02/20 1443 10/02/20 2144 10/03/20 0448 10/03/20 1346  BP: (!) 150/88 (!) 142/61 (!) 172/88 (!) 148/71  Pulse: 64 68 (!) 59 64  Resp: 18 (!) 21 19 18   Temp: 97.7 F (36.5 C) 98.2 F (36.8 C) 98 F (36.7 C) 98.1 F (36.7 C)  TempSrc: Oral Oral Oral Oral  SpO2: 98% 96% 97% 98%  Weight:      Height:        Intake/Output  Summary (Last 24 hours) at 10/03/2020 1920 Last data filed at 10/03/2020 1914 Gross per 24 hour  Intake 1 ml  Output --  Net 1 ml   Filed Weights   09/29/20 2127  Weight: 98 kg   Data Reviewed: I have  personally reviewed and interpreted daily labs, tele strips, imagings as discussed above. I reviewed all nursing notes, pharmacy notes, vitals, pertinent old records I have discussed plan of care as described above with RN and patient/family.  CBC: Recent Labs  Lab 09/29/20 2248 09/30/20 0602 10/01/20 0338 10/02/20 0437 10/03/20 1028  WBC 11.6* 10.4 14.5* 11.5* 9.1  NEUTROABS 9.3* 9.3* 12.4* 9.4* 6.5  HGB 14.1 14.3 14.2 13.5 14.0  HCT 44.6 44.3 43.9 43.1 44.6  MCV 93.9 92.1 92.2 95.1 92.9  PLT 208 230 218 211 782   Basic Metabolic Panel: Recent Labs  Lab 09/29/20 2248 09/30/20 0602 10/01/20 0338 10/02/20 0437 10/03/20 1028  NA 135 136 139 138 139  K 4.1 3.7 4.0 4.2 4.5  CL 99 100 104 104 102  CO2 25 23 27 24 28   GLUCOSE 149* 214* 164* 133* 144*  BUN 12 15 21 23  25*  CREATININE 1.01* 0.98 1.09* 0.79 0.83  CALCIUM 8.4* 8.3* 8.6* 8.3* 8.9  MG  --  2.0 2.3 2.3 2.3   Studies: No results found.  Scheduled Meds: . amLODipine  5 mg Oral Daily  . vitamin C  500 mg Oral Daily  . cholecalciferol  1,000 Units Oral Daily  . dexamethasone (DECADRON) injection  6 mg Intravenous Q24H  . enoxaparin (LOVENOX) injection  40 mg Subcutaneous Q24H  . escitalopram  5 mg Oral Daily  . fluticasone  1 spray Each Nare Daily  . fluticasone furoate-vilanterol  1 puff Inhalation Daily  . furosemide  40 mg Oral Daily  . insulin aspart  0-9 Units Subcutaneous TID WC  . levothyroxine  112 mcg Oral Q0600  . pantoprazole  40 mg Oral Daily  . zinc sulfate  220 mg Oral Daily   Continuous Infusions: . remdesivir 100 mg in NS 100 mL 100 mg (10/03/20 1114)   PRN Meds: acetaminophen, albuterol, ALPRAZolam, guaiFENesin-dextromethorphan, ondansetron **OR** ondansetron (ZOFRAN) IV, phenol, polyethylene glycol  Time spent: 35 minutes  Author: Berle Mull, MD [facility] 10/03/2020 7:20 PM  To reach On-call, see care teams to locate the attending and reach out via [email or web address]. Between  7PM-7AM, please contact night-coverage If you still have difficulty reaching the attending provider, please page the [facility] (Director on Call) for [facility] on amion for assistance.

## 2020-10-03 NOTE — Progress Notes (Signed)
Inpatient Diabetes Program Recommendations  AACE/ADA: New Consensus Statement on Inpatient Glycemic Control (2015)  Target Ranges:  Prepandial:   less than 140 mg/dL      Peak postprandial:   less than 180 mg/dL (1-2 hours)      Critically ill patients:  140 - 180 mg/dL   Lab Results  Component Value Date   GLUCAP 137 (H) 10/03/2020   HGBA1C 6.2 (H) 09/30/2020    Review of Glycemic Control Results for LEGACI, TARMAN (MRN [id number]) as of 10/03/2020 11:53  Ref. Range 10/02/2020 08:02 10/02/2020 11:20 10/02/2020 17:13 10/02/2020 21:46 10/03/2020 08:08  Glucose-Capillary Latest Ref Range: 70 - 99 mg/dL 105 (H) 294 (H) 306 (H) 283 (H) 137 (H)   Diabetes history: None Current orders for Inpatient glycemic control:  Decadron 6 mg daily Novolog sensitive tid with meals  Inpatient Diabetes Program Recommendations:   While on Decadron, please add Novolog 4 units tid with meals (hold if patient eats less than 50% or NPO).   Thanks  Adah Perl, RN, BC-ADM Inpatient Diabetes Coordinator Pager [phone] (8a-5p)

## 2020-10-04 DIAGNOSIS — U071 COVID-19: Secondary | ICD-10-CM | POA: Diagnosis not present

## 2020-10-04 LAB — COMPREHENSIVE METABOLIC PANEL
ALT: 37 U/L (ref 0–44)
AST: 31 U/L (ref 15–41)
Albumin: 3.3 g/dL — ABNORMAL LOW (ref 3.5–5.0)
Alkaline Phosphatase: 73 U/L (ref 38–126)
Anion gap: 8 (ref 5–15)
BUN: 28 mg/dL — ABNORMAL HIGH (ref 8–23)
CO2: 29 mmol/L (ref 22–32)
Calcium: 8.7 mg/dL — ABNORMAL LOW (ref 8.9–10.3)
Chloride: 100 mmol/L (ref 98–111)
Creatinine, Ser: 0.96 mg/dL (ref 0.44–1.00)
GFR, Estimated: 59 mL/min — ABNORMAL LOW (ref >60)
Glucose, Bld: 198 mg/dL — ABNORMAL HIGH (ref 70–99)
Potassium: 4.8 mmol/L (ref 3.5–5.1)
Sodium: 137 mmol/L (ref 135–145)
Total Bilirubin: 0.4 mg/dL (ref 0.3–1.2)
Total Protein: 6.2 g/dL — ABNORMAL LOW (ref 6.5–8.1)

## 2020-10-04 LAB — CBC WITH DIFFERENTIAL/PLATELET
Abs Immature Granulocytes: 0.13 10*3/uL — ABNORMAL HIGH (ref 0.00–0.07)
Basophils Absolute: 0 10*3/uL (ref 0.0–0.1)
Basophils Relative: 0 %
Eosinophils Absolute: 0 10*3/uL (ref 0.0–0.5)
Eosinophils Relative: 0 %
HCT: 43.9 % (ref 36.0–46.0)
Hemoglobin: 14.3 g/dL (ref 12.0–15.0)
Immature Granulocytes: 2 %
Lymphocytes Relative: 18 %
Lymphs Abs: 1.4 10*3/uL (ref 0.7–4.0)
MCH: 30.2 pg (ref 26.0–34.0)
MCHC: 32.6 g/dL (ref 30.0–36.0)
MCV: 92.8 fL (ref 80.0–100.0)
Monocytes Absolute: 0.6 10*3/uL (ref 0.1–1.0)
Monocytes Relative: 7 %
Neutro Abs: 5.5 10*3/uL (ref 1.7–7.7)
Neutrophils Relative %: 73 %
Platelets: 226 10*3/uL (ref 150–400)
RBC: 4.73 MIL/uL (ref 3.87–5.11)
RDW: 12.7 % (ref 11.5–15.5)
WBC: 7.6 10*3/uL (ref 4.0–10.5)
nRBC: 0 % (ref 0.0–0.2)

## 2020-10-04 LAB — C-REACTIVE PROTEIN: CRP: 0.9 mg/dL (ref <1.0)

## 2020-10-04 LAB — MAGNESIUM: Magnesium: 2.1 mg/dL (ref 1.7–2.4)

## 2020-10-04 LAB — GLUCOSE, CAPILLARY
Glucose-Capillary: 149 mg/dL — ABNORMAL HIGH (ref 70–99)
Glucose-Capillary: 226 mg/dL — ABNORMAL HIGH (ref 70–99)

## 2020-10-04 LAB — D-DIMER, QUANTITATIVE: D-Dimer, Quant: 0.34 ug/mL-FEU (ref 0.00–0.50)

## 2020-10-04 MED ORDER — ZINC SULFATE 220 (50 ZN) MG PO CAPS: 220.0000 mg | ORAL_CAPSULE | Freq: Every day | ORAL | 0 refills | Status: DC

## 2020-10-04 MED ORDER — ASCORBIC ACID 500 MG PO TABS: 500.0000 mg | ORAL_TABLET | Freq: Every day | ORAL | 0 refills | Status: DC

## 2020-10-04 MED ORDER — VITAMIN D3 25 MCG PO TABS: 1000.0000 [IU] | ORAL_TABLET | Freq: Every day | ORAL | 0 refills | Status: DC

## 2020-10-04 MED ORDER — DEXAMETHASONE 6 MG PO TABS: 6.0000 mg | ORAL_TABLET | Freq: Every day | ORAL | 0 refills | Status: AC

## 2020-10-04 MED ORDER — BENZONATATE 200 MG PO CAPS: 200.0000 mg | ORAL_CAPSULE | Freq: Three times a day (TID) | ORAL | 0 refills | Status: DC | PRN

## 2020-10-04 NOTE — Plan of Care (Signed)
  Problem: Clinical Measurements: Goal: Respiratory complications will improve Outcome: Progressing Goal: Cardiovascular complication will be avoided Outcome: Progressing   Problem: Safety: Goal: Ability to remain free from injury will improve Outcome: Progressing   Problem: Skin Integrity: Goal: Risk for impaired skin integrity will decrease Outcome: Progressing   

## 2020-10-04 NOTE — Plan of Care (Signed)

## 2020-10-04 NOTE — Progress Notes (Signed)
Discharge instructions given with stated understanding. IV site removed with no complication  Patient waiting for transportation home at this time.

## 2020-10-04 NOTE — Discharge Instructions (Signed)
COVID-19 COVID-19 is a respiratory infection that is caused by a virus called severe acute respiratory syndrome coronavirus 2 (SARS-CoV-2). The disease is also known as coronavirus disease or novel coronavirus. In some people, the virus may not cause any symptoms. In others, it may cause a serious infection. The infection can get worse quickly and can lead to complications, such as:  Pneumonia, or infection of the lungs.  Acute respiratory distress syndrome or ARDS. This is a condition in which fluid build-up in the lungs prevents the lungs from filling with air and passing oxygen into the blood.  Acute respiratory failure. This is a condition in which there is not enough oxygen passing from the lungs to the body or when carbon dioxide is not passing from the lungs out of the body.  Sepsis or septic shock. This is a serious bodily reaction to an infection.  Blood clotting problems.  Secondary infections due to bacteria or fungus.  Organ failure. This is when your body's organs stop working. The virus that causes COVID-19 is contagious. This means that it can spread from person to person through droplets from coughs and sneezes (respiratory secretions). What are the causes? This illness is caused by a virus. You may catch the virus by:  Breathing in droplets from an infected person. Droplets can be spread by a person breathing, speaking, singing, coughing, or sneezing.  Touching something, like a table or a doorknob, that was exposed to the virus (contaminated) and then touching your mouth, nose, or eyes. What increases the risk? Risk for infection You are more likely to be infected with this virus if you:  Are within 6 feet (2 meters) of a person with COVID-19.  Provide care for or live with a person who is infected with COVID-19.  Spend time in crowded indoor spaces or live in shared housing. Risk for serious illness You are more likely to become seriously ill from the virus if  you:  Are 50 years of age or older. The higher your age, the more you are at risk for serious illness.  Live in a nursing home or long-term care facility.  Have cancer.  Have a long-term (chronic) disease such as: ? Chronic lung disease, including chronic obstructive pulmonary disease or asthma. ? A long-term disease that lowers your body's ability to fight infection (immunocompromised). ? Heart disease, including heart failure, a condition in which the arteries that lead to the heart become narrow or blocked (coronary artery disease), a disease which makes the heart muscle thick, weak, or stiff (cardiomyopathy). ? Diabetes. ? Chronic kidney disease. ? Sickle cell disease, a condition in which red blood cells have an abnormal "sickle" shape. ? Liver disease.  Are obese. What are the signs or symptoms? Symptoms of this condition can range from mild to severe. Symptoms may appear any time from 2 to 14 days after being exposed to the virus. They include:  A fever or chills.  A cough.  Difficulty breathing.  Headaches, body aches, or muscle aches.  Runny or stuffy (congested) nose.  A sore throat.  New loss of taste or smell. Some people may also have stomach problems, such as nausea, vomiting, or diarrhea. Other people may not have any symptoms of COVID-19. How is this diagnosed? This condition may be diagnosed based on:  Your signs and symptoms, especially if: ? You live in an area with a COVID-19 outbreak. ? You recently traveled to or from an area where the virus is common. ? You   provide care for or live with a person who was diagnosed with COVID-19. ? You were exposed to a person who was diagnosed with COVID-19.  A physical exam.  Lab tests, which may include: ? Taking a sample of fluid from the back of your nose and throat (nasopharyngeal fluid), your nose, or your throat using a swab. ? A sample of mucus from your lungs (sputum). ? Blood tests.  Imaging tests,  which may include, X-rays, CT scan, or ultrasound. How is this treated? At present, there is no medicine to treat COVID-19. Medicines that treat other diseases are being used on a trial basis to see if they are effective against COVID-19. Your health care provider will talk with you about ways to treat your symptoms. For most people, the infection is mild and can be managed at home with rest, fluids, and over-the-counter medicines. Treatment for a serious infection usually takes places in a hospital intensive care unit (ICU). It may include one or more of the following treatments. These treatments are given until your symptoms improve.  Receiving fluids and medicines through an IV.  Supplemental oxygen. Extra oxygen is given through a tube in the nose, a face mask, or a hood.  Positioning you to lie on your stomach (prone position). This makes it easier for oxygen to get into the lungs.  Continuous positive airway pressure (CPAP) or bi-level positive airway pressure (BPAP) machine. This treatment uses mild air pressure to keep the airways open. A tube that is connected to a motor delivers oxygen to the body.  Ventilator. This treatment moves air into and out of the lungs by using a tube that is placed in your windpipe.  Tracheostomy. This is a procedure to create a hole in the neck so that a breathing tube can be inserted.  Extracorporeal membrane oxygenation (ECMO). This procedure gives the lungs a chance to recover by taking over the functions of the heart and lungs. It supplies oxygen to the body and removes carbon dioxide. Follow these instructions at home: Lifestyle  If you are sick, stay home except to get medical care. Your health care provider will tell you how long to stay home. Call your health care provider before you go for medical care.  Rest at home as told by your health care provider.  Do not use any products that contain nicotine or tobacco, such as cigarettes,  e-cigarettes, and chewing tobacco. If you need help quitting, ask your health care provider.  Return to your normal activities as told by your health care provider. Ask your health care provider what activities are safe for you. General instructions  Take over-the-counter and prescription medicines only as told by your health care provider.  Drink enough fluid to keep your urine pale yellow.  Keep all follow-up visits as told by your health care provider. This is important. How is this prevented?  There is no vaccine to help prevent COVID-19 infection. However, there are steps you can take to protect yourself and others from this virus. To protect yourself:   Do not travel to areas where COVID-19 is a risk. The areas where COVID-19 is reported change often. To identify high-risk areas and travel restrictions, check the CDC travel website: wwwnc.cdc.gov/travel/notices  If you live in, or must travel to, an area where COVID-19 is a risk, take precautions to avoid infection. ? Stay away from people who are sick. ? Wash your hands often with soap and water for 20 seconds. If soap and water   are not available, use an alcohol-based hand sanitizer. ? Avoid touching your mouth, face, eyes, or nose. ? Avoid going out in public, follow guidance from your state and local health authorities. ? If you must go out in public, wear a cloth face covering or face mask. Make sure your mask covers your nose and mouth. ? Avoid crowded indoor spaces. Stay at least 6 feet (2 meters) away from others. ? Disinfect objects and surfaces that are frequently touched every day. This may include:  Counters and tables.  Doorknobs and light switches.  Sinks and faucets.  Electronics, such as phones, remote controls, keyboards, computers, and tablets. To protect others: If you have symptoms of COVID-19, take steps to prevent the virus from spreading to others.  If you think you have a COVID-19 infection, contact  your health care provider right away. Tell your health care team that you think you may have a COVID-19 infection.  Stay home. Leave your house only to seek medical care. Do not use public transport.  Do not travel while you are sick.  Wash your hands often with soap and water for 20 seconds. If soap and water are not available, use alcohol-based hand sanitizer.  Stay away from other members of your household. Let healthy household members care for children and pets, if possible. If you have to care for children or pets, wash your hands often and wear a mask. If possible, stay in your own room, separate from others. Use a different bathroom.  Make sure that all people in your household wash their hands well and often.  Cough or sneeze into a tissue or your sleeve or elbow. Do not cough or sneeze into your hand or into the air.  Wear a cloth face covering or face mask. Make sure your mask covers your nose and mouth. Where to find more information  Centers for Disease Control and Prevention: www.cdc.gov/coronavirus/2019-ncov/index.html  World Health Organization: www.who.int/health-topics/coronavirus Contact a health care provider if:  You live in or have traveled to an area where COVID-19 is a risk and you have symptoms of the infection.  You have had contact with someone who has COVID-19 and you have symptoms of the infection. Get help right away if:  You have trouble breathing.  You have pain or pressure in your chest.  You have confusion.  You have bluish lips and fingernails.  You have difficulty waking from sleep.  You have symptoms that get worse. These symptoms may represent a serious problem that is an emergency. Do not wait to see if the symptoms will go away. Get medical help right away. Call your local emergency services (911 in the [place] Do not drive yourself to the hospital. Let the emergency medical personnel know if you think you have  COVID-19. Summary  COVID-19 is a respiratory infection that is caused by a virus. It is also known as coronavirus disease or novel coronavirus. It can cause serious infections, such as pneumonia, acute respiratory distress syndrome, acute respiratory failure, or sepsis.  The virus that causes COVID-19 is contagious. This means that it can spread from person to person through droplets from breathing, speaking, singing, coughing, or sneezing.  You are more likely to develop a serious illness if you are 50 years of age or older, have a weak immune system, live in a nursing home, or have chronic disease.  There is no medicine to treat COVID-19. Your health care provider will talk with you about ways to treat your symptoms.    Take steps to protect yourself and others from infection. Wash your hands often and disinfect objects and surfaces that are frequently touched every day. Stay away from people who are sick and wear a mask if you are sick. This information is not intended to replace advice given to you by your health care provider. Make sure you discuss any questions you have with your health care provider. Document Revised: 07/28/2019 Document Reviewed: 11/03/2018 Elsevier Patient Education  2020 [place].  COVID-19: How to Protect Yourself and Others Know how it spreads  There is currently no vaccine to prevent coronavirus disease 2019 (COVID-19).  The best way to prevent illness is to avoid being exposed to this virus.  The virus is thought to spread mainly from person-to-person. ? Between people who are in close contact with one another (within about 6 feet). ? Through respiratory droplets produced when an infected person coughs, sneezes or talks. ? These droplets can land in the mouths or noses of people who are nearby or possibly be inhaled into the lungs. ? COVID-19 may be spread by people who are not showing symptoms. Everyone should Clean your hands often  Wash your hands  often with soap and water for at least 20 seconds especially after you have been in a public place, or after blowing your nose, coughing, or sneezing.  If soap and water are not readily available, use a hand sanitizer that contains at least 60% alcohol. Cover all surfaces of your hands and rub them together until they feel dry.  Avoid touching your eyes, nose, and mouth with unwashed hands. Avoid close contact  Limit contact with others as much as possible.  Avoid close contact with people who are sick.  Put distance between yourself and other people. ? Remember that some people without symptoms may be able to spread virus. ? This is especially important for people who are at higher risk of getting very GainPain.com.cy Cover your mouth and nose with a mask when around others  You could spread COVID-19 to others even if you do not feel sick.  Everyone should wear a mask in public settings and when around people not living in their household, especially when social distancing is difficult to maintain. ? Masks should not be placed on young children under age 92, anyone who has trouble breathing, or is unconscious, incapacitated or otherwise unable to remove the mask without assistance.  The mask is meant to protect other people in case you are infected.  Do NOT use a facemask meant for a Dietitian.  Continue to keep about 6 feet between yourself and others. The mask is not a substitute for social distancing. Cover coughs and sneezes  Always cover your mouth and nose with a tissue when you cough or sneeze or use the inside of your elbow.  Throw used tissues in the trash.  Immediately wash your hands with soap and water for at least 20 seconds. If soap and water are not readily available, clean your hands with a hand sanitizer that contains at least 60% alcohol. Clean and disinfect  Clean AND disinfect  frequently touched surfaces daily. This includes tables, doorknobs, light switches, countertops, handles, desks, phones, keyboards, toilets, faucets, and sinks. [email or web address]  If surfaces are dirty, clean them: Use detergent or soap and water prior to disinfection.  Then, use a household disinfectant. You can see a list of EPA-registered household disinfectants here. [email or web address] 06/14/2019 This information is not intended to replace advice given to you by  your health care provider. Make sure you discuss any questions you have with your health care provider. Document Revised: 06/22/2019 Document Reviewed: 04/20/2019 Elsevier Patient Education  [place] Can Do to Manage Your COVID-19 Symptoms at Home If you have possible or confirmed COVID-19: 1. Stay home from work and school. And stay away from other public places. If you must go out, avoid using any kind of public transportation, ridesharing, or taxis. 2. Monitor your symptoms carefully. If your symptoms get worse, call your healthcare provider immediately. 3. Get rest and stay hydrated. 4. If you have a medical appointment, call the healthcare provider ahead of time and tell them that you have or may have COVID-19. 5. For medical emergencies, call 911 and notify the dispatch personnel that you have or may have COVID-19. 6. Cover your cough and sneezes with a tissue or use the inside of your elbow. 7. Wash your hands often with soap and water for at least 20 seconds or clean your hands with an alcohol-based hand sanitizer that contains at least 60% alcohol. 8. As much as possible, stay in a specific room and away from other people in your home. Also, you should use a separate bathroom, if available. If you need to be around other people in or outside of the home, wear a mask. 9. Avoid sharing personal items with other people in your household,  like dishes, towels, and bedding. 10. Clean all surfaces that are touched often, like counters, tabletops, and doorknobs. Use household cleaning sprays or wipes according to the label instructions. [email or web address] 04/12/2019 This information is not intended to replace advice given to you by your health care provider. Make sure you discuss any questions you have with your health care provider. Document Revised: 09/14/2019 Document Reviewed: 09/14/2019 Elsevier Patient Education  [place].   COVID-19 Frequently Asked Questions COVID-19 (coronavirus disease) is an infection that is caused by a large family of viruses. Some viruses cause illness in people and others cause illness in animals like camels, cats, and bats. In some cases, the viruses that cause illness in animals can spread to humans. Where did the coronavirus come from? In December 2019, [place] told the [facility]) of several cases of lung disease (human respiratory illness). These cases were linked to an open seafood and livestock market in the city of [place]. The link to the seafood and livestock market suggests that the virus may have spread from animals to humans. However, since that first outbreak in December, the virus has also been shown to spread from person to person. What is the name of the disease and the virus? Disease name Early on, this disease was called novel coronavirus. This is because scientists determined that the disease was caused by a new (novel) respiratory virus. The World Health Organization [facility]) has now named the disease COVID-19, or coronavirus disease. Virus name The virus that causes the disease is called severe acute respiratory syndrome coronavirus 2 (SARS-CoV-2). More information on disease and virus naming World Health Organization [facility]):  www.who.int/emergencies/diseases/novel-coronavirus-2019/technical-guidance/naming-the-coronavirus-disease-(covid-2019)-and-the-virus-that-causes-it Who is at risk for complications from coronavirus disease? Some people may be at higher risk for complications from coronavirus disease. This includes older adults and people who have chronic diseases, such as heart disease, diabetes, and lung disease. If you are at higher risk for complications, take these extra precautions:  Stay home as much as possible.  Avoid social gatherings and travel.  Avoid close contact with others. Stay at least  6 ft (2 m) away from others, if possible.  Wash your hands often with soap and water for at least 20 seconds.  Avoid touching your face, mouth, nose, or eyes.  Keep supplies on hand at home, such as food, medicine, and cleaning supplies.  If you must go out in public, wear a cloth face covering or face mask. Make sure your mask covers your nose and mouth. How does coronavirus disease spread? The virus that causes coronavirus disease spreads easily from person to person (is contagious). You may catch the virus by:  Breathing in droplets from an infected person. Droplets can be spread by a person breathing, speaking, singing, coughing, or sneezing.  Touching something, like a table or a doorknob, that was exposed to the virus (contaminated) and then touching your mouth, nose, or eyes. Can I get the virus from touching surfaces or objects? There is still a lot that we do not know about the virus that causes coronavirus disease. Scientists are basing a lot of information on what they know about similar viruses, such as:  Viruses cannot generally survive on surfaces for long. They need a human body (host) to survive.  It is more likely that the virus is spread by close contact with people who are sick (direct contact), such as through: ? Shaking hands or hugging. ? Breathing in respiratory droplets that  travel through the air. Droplets can be spread by a person breathing, speaking, singing, coughing, or sneezing.  It is less likely that the virus is spread when a person touches a surface or object that has the virus on it (indirect contact). The virus may be able to enter the body if the person touches a surface or object and then touches his or her face, eyes, nose, or mouth. Can a person spread the virus without having symptoms of the disease? It may be possible for the virus to spread before a person has symptoms of the disease, but this is most likely not the main way the virus is spreading. It is more likely for the virus to spread by being in close contact with people who are sick and breathing in the respiratory droplets spread by a person breathing, speaking, singing, coughing, or sneezing. What are the symptoms of coronavirus disease? Symptoms vary from person to person and can range from mild to severe. Symptoms may include:  Fever or chills.  Cough.  Difficulty breathing or feeling short of breath.  Headaches, body aches, or muscle aches.  Runny or stuffy (congested) nose.  Sore throat.  New loss of taste or smell.  Nausea, vomiting, or diarrhea. These symptoms can appear anywhere from 2 to 14 days after you have been exposed to the virus. Some people may not have any symptoms. If you develop symptoms, call your health care provider. People with severe symptoms may need hospital care. Should I be tested for this virus? Your health care provider will decide whether to test you based on your symptoms, history of exposure, and your risk factors. How does a health care provider test for this virus? Health care providers will collect samples to send for testing. Samples may include:  Taking a swab of fluid from the back of your nose and throat, your nose, or your throat.  Taking fluid from the lungs by having you cough up mucus (sputum) into a sterile cup.  Taking a blood  sample. Is there a treatment or vaccine for this virus? Currently, there is no vaccine to prevent  coronavirus disease. Also, there are no medicines like antibiotics or antivirals to treat the virus. A person who becomes sick is given supportive care, which means rest and fluids. A person may also relieve his or her symptoms by using over-the-counter medicines that treat sneezing, coughing, and runny nose. These are the same medicines that a person takes for the common cold. If you develop symptoms, call your health care provider. People with severe symptoms may need hospital care. What can I do to protect myself and my family from this virus?     You can protect yourself and your family by taking the same actions that you would take to prevent the spread of other viruses. Take the following actions:  Wash your hands often with soap and water for at least 20 seconds. If soap and water are not available, use alcohol-based hand sanitizer.  Avoid touching your face, mouth, nose, or eyes.  Cough or sneeze into a tissue, sleeve, or elbow. Do not cough or sneeze into your hand or the air. ? If you cough or sneeze into a tissue, throw it away immediately and wash your hands.  Disinfect objects and surfaces that you frequently touch every day.  Stay away from people who are sick.  Avoid going out in public, follow guidance from your state and local health authorities.  Avoid crowded indoor spaces. Stay at least 6 ft (2 m) away from others.  If you must go out in public, wear a cloth face covering or face mask. Make sure your mask covers your nose and mouth.  Stay home if you are sick, except to get medical care. Call your health care provider before you get medical care. Your health care provider will tell you how long to stay home.  Make sure your vaccines are up to date. Ask your health care provider what vaccines you need. What should I do if I need to travel? Follow travel recommendations  from your local health authority, the CDC, and WHO. Travel information and advice  Centers for Disease Control and Prevention (CDC): BodyEditor.hu  World Health Organization [facility]): ThirdIncome.ca Know the risks and take action to protect your health  You are at higher risk of getting coronavirus disease if you are traveling to areas with an outbreak or if you are exposed to travelers from areas with an outbreak.  Wash your hands often and practice good hygiene to lower the risk of catching or spreading the virus. What should I do if I am sick? General instructions to stop the spread of infection  Wash your hands often with soap and water for at least 20 seconds. If soap and water are not available, use alcohol-based hand sanitizer.  Cough or sneeze into a tissue, sleeve, or elbow. Do not cough or sneeze into your hand or the air.  If you cough or sneeze into a tissue, throw it away immediately and wash your hands.  Stay home unless you must get medical care. Call your health care provider or local health authority before you get medical care.  Avoid public areas. Do not take public transportation, if possible.  If you can, wear a mask if you must go out of the house or if you are in close contact with someone who is not sick. Make sure your mask covers your nose and mouth. Keep your home clean  Disinfect objects and surfaces that are frequently touched every day. This may include: ? Counters and tables. ? Doorknobs and light switches. ?  Sinks and faucets. ? Electronics such as phones, remote controls, keyboards, computers, and tablets.  Wash dishes in hot, soapy water or use a dishwasher. Air-dry your dishes.  Wash laundry in hot water. Prevent infecting other household members  Let healthy household members care for children and pets, if possible. If you have to care for children or  pets, wash your hands often and wear a mask.  Sleep in a different bedroom or bed, if possible.  Do not share personal items, such as razors, toothbrushes, deodorant, combs, brushes, towels, and washcloths. Where to find more information Centers for Disease Control and Prevention (CDC)  Information and news updates: https://www.butler-gonzalez.com/ World Health Organization [facility])  Information and news updates: MissExecutive.com.ee  Coronavirus health topic: https://www.castaneda.info/  Questions and answers on COVID-19: OpportunityDebt.at  Global tracker: who.sprinklr.com American Academy of Pediatrics (AAP)  Information for families: www.healthychildren.org/English/health-issues/conditions/chest-lungs/Pages/2019-Novel-Coronavirus.aspx The coronavirus situation is changing rapidly. Check your local health authority website or the CDC and [facility] websites for updates and news. When should I contact a health care provider?  Contact your health care provider if you have symptoms of an infection, such as fever or cough, and you: ? Have been near anyone who is known to have coronavirus disease. ? Have come into contact with a person who is suspected to have coronavirus disease. ? Have traveled to an area where there is an outbreak of COVID-19. When should I get emergency medical care?  Get help right away by calling your local emergency services (911 in the [place] if you have: ? Trouble breathing. ? Pain or pressure in your chest. ? Confusion. ? Blue-tinged lips and fingernails. ? Difficulty waking from sleep. ? Symptoms that get worse. Let the emergency medical personnel know if you think you have coronavirus disease. Summary  A new respiratory virus is spreading from person to person and causing COVID-19 (coronavirus disease).  The virus that causes COVID-19 appears to spread easily. It spreads from one  person to another through droplets from breathing, speaking, singing, coughing, or sneezing.  Older adults and those with chronic diseases are at higher risk of disease. If you are at higher risk for complications, take extra precautions.  There is currently no vaccine to prevent coronavirus disease. There are no medicines, such as antibiotics or antivirals, to treat the virus.  You can protect yourself and your family by washing your hands often, avoiding touching your face, and covering your coughs and sneezes. This information is not intended to replace advice given to you by your health care provider. Make sure you discuss any questions you have with your health care provider. Document Revised: 07/28/2019 Document Reviewed: 01/24/2019 Elsevier Patient Education  [place]: Quarantine vs. Isolation QUARANTINE keeps someone who was in close contact with someone who has COVID-19 away from others. If you had close contact with a person who has COVID-19  Stay home until 14 days after your last contact.  Check your temperature twice a day and watch for symptoms of COVID-19.  If possible, stay away from people who are at higher-risk for getting very sick from COVID-19. ISOLATION keeps someone who is sick or tested positive for COVID-19 without symptoms away from others, even in their own home. If you are sick and think or know you have COVID-19  Stay home until after ? At least 10 days since symptoms first appeared and ? At least 24 hours with no fever without fever-reducing medication and ? Symptoms have improved If you tested positive  for COVID-19 but do not have symptoms  Stay home until after ? 10 days have passed since your positive test If you live with others, stay in a specific "sick room" or area and away from other people or animals, including pets. Use a separate bathroom, if available. michellinders.com 05/01/2019 This information is not intended to  replace advice given to you by your health care provider. Make sure you discuss any questions you have with your health care provider. Document Revised: 09/14/2019 Document Reviewed: 09/14/2019 Elsevier Patient Education  [place].

## 2020-10-05 LAB — CULTURE, BLOOD (ROUTINE X 2)
Culture: NO GROWTH
Culture: NO GROWTH
Special Requests: ADEQUATE
Special Requests: ADEQUATE

## 2020-10-06 NOTE — Discharge Summary (Signed)
[facility] Discharge Summary   Patient: Jenny [name] [id number]  PCP: Midge Minium, MD  Date of admission: 09/29/2020   Date of discharge: 10/04/2020      Discharge Diagnoses:  Principal Problem:   COVID-19 virus infection Active Problems:   Hypothyroidism   OSA on CPAP   Essential hypertension   GERD without esophagitis   Eosinophilic asthma   NSIP (nonspecific interstitial pneumonitis) ([place])   Admitted From: Home Disposition:  Home   Recommendations for Outpatient Follow-up:  1. PCP: Follow-up with PCP in 1 week 2. Follow up LABS/TEST: None   Follow-up Information    Midge Minium, MD. Schedule an appointment as soon as possible for a visit in 1 week(s).   Specialty: Family Medicine Contact information: [address] [place] [address] [phone]              Discharge Instructions    Diet - low sodium heart healthy   Complete by: As directed    Increase activity slowly   Complete by: As directed       Diet recommendation: Cardiac diet  Activity: The patient is advised to gradually reintroduce usual activities, as tolerated  Discharge Condition: stable  Code Status: Full code   History of present illness: As per the H and P dictated on admission, "80 year old female with past medical history of eosinophilic asthma, NSIP, gastroesophageal reflux disease, hypothyroidism, depression, obstructive sleep apnea who presents to [facility] emergency department with complaints of weakness cough and fevers.  Patient explains that for approximately the past 2 days she has been experiencing severe generalized body aches. This is associated generalized weakness and cough, typically dry cough. Patient has been experiencing poor oral intake over the same time as well as sore throat and wheezing. Patient remarkably denies shortness of breath however.  Over the following 48 hours patient symptoms rapid worsened. Patient's  weakness has become quite severe, worse with minimal exertion and improved with rest.  Patient eventually presented to [facility] emergency room for evaluation. Upon evaluation in the emergency department, COVID-19 PCR testing has been found to be positive. Patient is also been found to exhibit relative hypoxia on arrival and has been placed on submental oxygen via nasal cannula. The hospitalist group has now been called to assess the hospital "  Hospital Course:  Summary of her active problems in the hospital is as following. 1. Acute COVID-19 Viral Pneumonia CXR: hazy bilateral peripheral opacities Oxygen requirement: room air at rest and exertion.  CRP: 0.9 Remdesivir:  Completed in the hospital Steroids: on dexamethasone Baricitinib/Actemra(off-label use): not indicated.   The investigational nature of this medication was discussed with the patient/HCPOA and they choose to proceed as the potential benefits are felt to outweigh risks at this time.  Antibiotics:not indicated Vitamin C and Zinc: not indicated DVT Prophylaxis: enoxaparin (LOVENOX) injection 40 mg Start: 09/30/20 1000 Prone positioning and incentive spirometer use recommended.  The treatment plan and use of medications and known side effects were discussed with patient/family. It was clearly explained that Complete risks and long-term side effects are unknown, however in the best clinical judgment they seem to be of some clinical benefit rather than medical risks. Patient/family agree with the treatment plan and want to receive these treatments as indicated.  2. Hypothyroidism Continue home levothyroxine  3. Hyperglycemia in the setting of IV steroids Hemoglobin A1c 6.2 Continue home regimen  4. Generalized weakness likely secondary to COVID-19 viral infection PT  assess with no further PT recommendations.  5. Essential hypertension Blood pressure is elevated.  Continue home regimen as well as  continue Lasix on a scheduled basis for the next few days.  6. GERD PPI  7. Depression Continue Lexapro.   8. Obesity Placing the patient at risk for poor outcome secondary to COVID-19 pneumonia. Body mass index is 35.94 kg/m.   Patient was seen by physical therapy, who recommended No therapy needed on discharge. On the day of the discharge the patient's vitals were stable, and no other acute medical condition were reported by patient. The patient was felt safe to be discharge at Home with No Therapy, pt/family refused..  Consultants: NONE Procedures: none  DISCHARGE MEDICATION: Allergies as of 10/04/2020      Reactions   Prednisone Other (See Comments)   Confusion, dizziness Medrol is ok   Codeine Nausea Only       Tramadol Itching      Medication List    STOP taking these medications   ibuprofen 200 MG tablet Commonly known as: ADVIL     TAKE these medications   albuterol 108 (90 Base) MCG/ACT inhaler Commonly known as: VENTOLIN HFA Inhale 2 puffs into the lungs every 6 (six) hours as needed. For wheezing What changed:   reasons to take this  additional instructions   ALPRAZolam 0.5 MG tablet Commonly known as: XANAX TAKE 1/2 TO 1 TABLET BY MOUTH 3 TIMES DAILY AS NEEDED FOR ANXIETY- Last refill without appt What changed:   how much to take  how to take this  when to take this  reasons to take this  additional instructions   amLODipine 5 MG tablet Commonly known as: NORVASC Take 1 tablet (5 mg total) by mouth daily.   ascorbic acid 500 MG tablet Commonly known as: VITAMIN C Take 1 tablet (500 mg total) by mouth daily.   benzonatate 200 MG capsule Commonly known as: TESSALON Take 1 capsule (200 mg total) by mouth 3 (three) times daily as needed for cough.   budesonide-formoterol 160-4.5 MCG/ACT inhaler Commonly known as: SYMBICORT Inhale 2 puffs into the lungs 2 (two) times daily.   cetirizine 10 MG tablet Commonly known as:  ZYRTEC Take 10 mg by mouth daily as needed.   dexamethasone 6 MG tablet Commonly known as: Decadron Take 1 tablet (6 mg total) by mouth daily for 6 days.   Dupixent 300 MG/2ML prefilled syringe Generic drug: dupilumab Inject 300 mg into the skin every 14 (fourteen) days.   escitalopram 5 MG tablet Commonly known as: LEXAPRO TAKE 1 TABLET BY MOUTH EVERY DAY   fluticasone 50 MCG/ACT nasal spray Commonly known as: FLONASE PLACE 1 SPRAY INTO BOTH NOSTRILS DAILY.   Flutter Devi 1 Device by Does not apply route in the morning, at noon, and at bedtime.   furosemide 20 MG tablet Commonly known as: LASIX Take 20 mg by mouth daily as needed for fluid.   levothyroxine 112 MCG tablet Commonly known as: SYNTHROID TAKE 1 TABLET BY MOUTH EVERY DAY What changed: when to take this   meclizine 25 MG tablet Commonly known as: ANTIVERT Take 1 tablet (25 mg total) by mouth 3 (three) times daily as needed for up to 30 doses for dizziness.   pantoprazole 40 MG tablet Commonly known as: PROTONIX TAKE 1 TABLET BY MOUTH EVERY DAY 30 MINUTES BEFORE DINNER What changed: See the new instructions.   REFRESH LIQUIGEL OP Place 1 drop into both eyes daily as needed (dry eyes).  Vitamin D3 25 MCG tablet Commonly known as: Vitamin D Take 1 tablet (1,000 Units total) by mouth daily.   zinc sulfate 220 (50 Zn) MG capsule Take 1 capsule (220 mg total) by mouth daily.      Discharge Exam: Filed Weights   09/29/20 2127  Weight: 98 kg   Vitals:   10/03/20 2043 10/04/20 0423  BP: 137/65 139/73  Pulse: 65 64  Resp: 17 16  Temp: 98.1 F (36.7 C) 98.5 F (36.9 C)  SpO2: 96% 95%   General: Appear in no distress, no Rash; Oral Mucosa Clear, moist. no Abnormal Neck Mass Or lumps, Conjunctiva normal  Cardiovascular: S1 and S2 Present, no Murmur Respiratory: good respiratory effort, Bilateral Air entry present and bilateral  Crackles, no wheezes Abdomen: Bowel Sound present, Soft and no  tenderness Extremities: bilateral  Pedal edema Neurology: alert and oriented to time, place, and person affect appropriate. no new focal deficit  The results of significant diagnostics from this hospitalization (including imaging, microbiology, ancillary and laboratory) are listed below for reference.    Significant Diagnostic Studies: DG Chest Port 1 View  Result Date: 09/29/2020 CLINICAL DATA:  Weakness, dizziness and fever. EXAM: PORTABLE CHEST 1 VIEW COMPARISON:  October 26, 2019 FINDINGS: The heart size and mediastinal contours are within normal limits. Both lungs are clear. The visualized skeletal structures are unremarkable. IMPRESSION: No active disease. Electronically Signed   By: Virgina Norfolk M.D.   On: 09/29/2020 23:25   XR Knee 1-2 Views Left  Result Date: 09/19/2020 AP lateral left knee reviewed.  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  No acute fracture.  Degenerative changes also present but to a lesser degree in the lateral and patellofemoral compartments.    XR Lumbar Spine 2-3 Views  Result Date: 09/19/2020 AP lateral lumbar spine reviewed.  Pedicle screw fusion fixation present L4-5 L5-S1.  No complications apparent in the hardware.  Significant adjacent segment disease is present both between the vertebral bodies as well as in the facet joints at L3-4.   Microbiology: Recent Results (from the past 240 hour(s))  Resp Panel by RT-PCR (Flu A&B, Covid) Nasopharyngeal Swab     Status: Abnormal   Collection Time: 09/29/20 10:48 PM   Specimen: Nasopharyngeal Swab; Nasopharyngeal(NP) swabs in vial transport medium  Result Value Ref Range Status   SARS Coronavirus 2 by RT PCR POSITIVE (A) NEGATIVE Final    Comment: CRITICAL RESULT CALLED TO, READ BACK BY AND VERIFIED WITH: SARA DOSTER RN  09/29/2020@0228  BY P.HENDERSON (NOTE) SARS-CoV-2 target nucleic acids are DETECTED.  The SARS-CoV-2 RNA is generally detectable in upper  respiratory specimens during the acute phase of infection. Positive results are indicative of the presence of the identified virus, but do not rule out bacterial infection or co-infection with other pathogens not detected by the test. Clinical correlation with patient history and other diagnostic information is necessary to determine patient infection status. The expected result is Negative.  Fact Sheet for Patients: EntrepreneurPulse.com.au  Fact Sheet for Healthcare Providers: IncredibleEmployment.be  This test is not yet approved or cleared by the [place] FDA and  has been authorized for detection and/or diagnosis of SARS-CoV-2 by FDA under an Emergency Use Authorization (EUA).  This EUA will remain in effect (me aning this test can be used) for the duration of  the COVID-19 declaration under Section 564(b)(1) of the Act, 21 U.S.C. section 360bbb-3(b)(1), unless the authorization is terminated or revoked sooner.  Influenza A by PCR NEGATIVE NEGATIVE Final   Influenza B by PCR NEGATIVE NEGATIVE Final    Comment: (NOTE) The Xpert Xpress SARS-CoV-2/FLU/RSV plus assay is intended as an aid in the diagnosis of influenza from Nasopharyngeal swab specimens and should not be used as a sole basis for treatment. Nasal washings and aspirates are unacceptable for Xpert Xpress SARS-CoV-2/FLU/RSV testing.  Fact Sheet for Patients: EntrepreneurPulse.com.au  Fact Sheet for Healthcare Providers: IncredibleEmployment.be  This test is not yet approved or cleared by the [place] FDA and has been authorized for detection and/or diagnosis of SARS-CoV-2 by FDA under an Emergency Use Authorization (EUA). This EUA will remain in effect (meaning this test can be used) for the duration of the COVID-19 declaration under Section 564(b)(1) of the Act, 21 U.S.C. section 360bbb-3(b)(1), unless the authorization is  terminated or revoked.  Performed at [facility], [place] [address] [place], [place] [address]   Culture, blood (routine x 2)     Status: None   Collection Time: 09/29/20 11:04 PM   Specimen: BLOOD  Result Value Ref Range Status   Specimen Description   Final    BLOOD BLOOD LEFT FOREARM Performed at [facility] [address], [place], [place] [address]    Special Requests   Final    BOTTLES DRAWN AEROBIC AND ANAEROBIC Blood Culture adequate volume Performed at [facility] [address], [place], [place] [address]    Culture   Final    NO GROWTH 5 DAYS Performed at [facility], [place] [address], [place], [place] [address]    Report Status 10/05/2020 FINAL  Final  Culture, blood (routine x 2)     Status: None   Collection Time: 09/29/20 11:14 PM   Specimen: BLOOD  Result Value Ref Range Status   Specimen Description   Final    BLOOD LEFT ARM Performed at [facility] [address] [place], [place] [address]    Special Requests   Final    BOTTLES DRAWN AEROBIC AND ANAEROBIC Blood Culture adequate volume Performed at [facility] [address] [place], [place] [address]    Culture   Final    NO GROWTH 5 DAYS Performed at [facility], [place] [address], [place], [place] [address]    Report Status 10/05/2020 FINAL  Final  Urine culture     Status: Abnormal   Collection Time: 09/30/20  1:12 AM   Specimen: Urine, Clean Catch  Result Value Ref Range Status   Specimen Description   Final    URINE, CLEAN CATCH Performed at [facility], [place] [address], [place], [place] [address]    Special Requests   Final    NONE Performed at [facility], [place] [address] [place], [place] [address]    Culture (A)  Final    <10,000 COLONIES/mL INSIGNIFICANT GROWTH Performed at [facility] [address], [place],  [address]     Report Status 09/30/2020 FINAL  Final     Labs: CBC: Recent Labs  Lab 09/30/20 0602 10/01/20 0338 10/02/20 0437 10/03/20 1028 10/04/20 0430  WBC 10.4 14.5* 11.5* 9.1 7.6  NEUTROABS 9.3* 12.4* 9.4* 6.5 5.5  HGB 14.3 14.2 13.5 14.0 14.3  HCT 44.3 43.9 43.1 44.6 43.9  MCV 92.1 92.2 95.1 92.9 92.8  PLT 230 218 211 263 [id number]   Basic Metabolic Panel: Recent Labs  Lab 09/30/20 0602 10/01/20  [id number] 10/02/20 0437 10/03/20 1028 10/04/20 0430  NA 136 139 138 139 137  K 3.7 4.0 4.2 4.5 4.8  CL 100 104 104 102 100  CO2 23 27 24 28 29   GLUCOSE 214* 164* 133* 144* 198*  BUN 15 21 23  25* 28*  CREATININE 0.98 1.09* 0.79 0.83 0.96  CALCIUM 8.3* 8.6* 8.3* 8.9 8.7*  MG 2.0 2.3 2.3 2.3 2.1   Liver Function Tests: Recent Labs  Lab 09/30/20 0602 10/01/20 0338 10/02/20 0437 10/03/20 1028 10/04/20 0430  AST 23 16 14* 31 31  ALT 16 15 13 26  37  ALKPHOS 84 73 67 70 73  BILITOT 0.5 0.3 0.5 0.5 0.4  PROT 6.8 6.3* 5.9* 6.7 6.2*  ALBUMIN 3.4* 3.2* 3.0* 3.6 3.3*   CBG: Recent Labs  Lab 10/03/20 1154 10/03/20 1703 10/03/20 2045 10/04/20 0752 10/04/20 1123  GLUCAP 237* 280* 299* 149* 226*    Time spent: 35 minutes  Signed:  Berle Mull  [facility] 10/04/2020 9:10 PM

## 2020-10-07 ENCOUNTER — Other Ambulatory Visit: Payer: Self-pay | Admitting: *Deleted

## 2020-10-07 ENCOUNTER — Telehealth: Payer: Self-pay

## 2020-10-07 NOTE — Patient Outreach (Signed)
[facility]) Care Management  10/07/2020  Jenny [name] August 22, 1941 [id number]   EMMI-GENERAL DISCHARGE-RESOLVED RED ON EMMI ALERT Day #1 Date:10/06/2020 Red Alert Reason: NEED SCHEDULED FOR FOLLOW UP APPOINTMENT  OUTREACH #1 RN spoke with pt concerning the above emmi. Pt states she would call today and scheduled the appointment. No additional needs as pt aware she will received another emmi call of inquiry on possible needs. Pt verbalized an understanding with no additional needs at this time.  Will close this case.  Elliot Cousin, RN Care Management Coordinator [facility] Main Office [phone]

## 2020-10-07 NOTE — Telephone Encounter (Signed)
Called [facility] and rescheduled pt for 1.11.22. Pt is now at home. Will forward to the injection pool as FYI.

## 2020-10-07 NOTE — Telephone Encounter (Signed)
1st attempt TCM call-Left message to call back.

## 2020-10-08 ENCOUNTER — Telehealth: Payer: Self-pay

## 2020-10-08 NOTE — Telephone Encounter (Signed)
Transition Care Management Follow-up Telephone Call  Date of discharge and from where: 10/04/20 [facility]  How have you been since you were released from the hospital? Better but tired  Any questions or concerns? No  Items Reviewed:  Did the pt receive and understand the discharge instructions provided? Yes   Medications obtained and verified? Yes   Other? No   Any new allergies since your discharge? No   Dietary orders reviewed? Yes  Do you have support at home? Yes   Home Care and Equipment/Supplies: Were home health services ordered? no If so, what is the name of the agency? n/a  Has the agency set up a time to come to the patient's home? not applicable Were any new equipment or medical supplies ordered?  No What is the name of the medical supply agency? n/a Were you able to get the supplies/equipment? not applicable Do you have any questions related to the use of the equipment or supplies? N/a  Functional Questionnaire: (I = Independent and D = Dependent) ADLs: I  Bathing/Dressing- I  Meal Prep- I  Eating- I  Maintaining continence- I  Transferring/Ambulation- I  Managing Meds- I  Follow up appointments reviewed:   PCP Hospital f/u appt confirmed? Yes  Scheduled to see Dr. Beverely Low on 10/15/20 @ 11:00.  Specialist Hospital f/u appt confirmed? n/a   Are transportation arrangements needed? No   If their condition worsens, is the pt aware to call PCP or go to the Emergency Dept.? Yes  Was the patient provided with contact information for the PCP's office or ED? Yes  Was to pt encouraged to call back with questions or concerns? Yes

## 2020-10-10 ENCOUNTER — Other Ambulatory Visit: Payer: Self-pay | Admitting: Pulmonary Disease

## 2020-10-15 ENCOUNTER — Ambulatory Visit: Payer: Medicare HMO

## 2020-10-15 ENCOUNTER — Other Ambulatory Visit: Payer: Self-pay

## 2020-10-15 ENCOUNTER — Encounter: Payer: Self-pay | Admitting: Family Medicine

## 2020-10-15 ENCOUNTER — Telehealth (INDEPENDENT_AMBULATORY_CARE_PROVIDER_SITE_OTHER): Payer: Medicare Other | Admitting: Family Medicine

## 2020-10-15 DIAGNOSIS — U071 COVID-19: Secondary | ICD-10-CM | POA: Diagnosis not present

## 2020-10-15 NOTE — Progress Notes (Signed)
Virtual Visit via Video   I connected with patient on 10/15/20 at 11:00 AM EST by a video enabled telemedicine application and verified that I am speaking with the correct person using two identifiers.  Location patient: Home Location provider: [facility] Office Persons participating in the virtual visit: Patient, Provider, [place] (Sabrina M)  I discussed the limitations of evaluation and management by telemedicine and the availability of in person appointments. The patient expressed understanding and agreed to proceed.  Subjective:   HPI:   Hospital f/u- pt was admitted 12/19-12/24 w/ COVID after presenting w/ weakness, cough, and fever.  She was mildly hypoxic on arrival and was given O2 via [place].  She completed Remdesivir during her hospitalization and was d/c'd on Dexamethasone.  She had Lovenox for DVT prevention while hospitalized.  By d/c, pt was able to wean her O2 requirement and was doing well on room air.  Today pt reports 'doing much better'.  'That COVID is not something to play around with'.  Does not require home O2.  She states SOB has resolved, energy is improving.  Pt feels she is getting 'stronger each day'.  Eating and drinking normally.  Pt is doing her home exercises.  Denies leg swelling, redness, soreness.  No CP  Reviewed ER notes, H&P, D/C summary, labs, imaging.  ROS:   See pertinent positives and negatives per HPI.  Patient Active Problem List   Diagnosis Date Noted  . COVID-19 virus infection 09/30/2020  . NSIP (nonspecific interstitial pneumonitis) ([place]) 09/30/2020  . 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 Abnormal CT of the chest 04/18/2020  . Cough 07/06/2018  . Peripheral edema 02/22/2017  . Localized edema 12/17/2016  . Colon cancer screening 06/15/2016  . COPD mixed type ([place]) 02/10/2016  . Hyperglycemia 07/07/2015  . Ceruminosis 03/07/2015  . Left hip pain 04/26/2014  . Acute pain of left knee 04/26/2014  . Annual  physical exam 08/06/2013  . Atrophic vaginitis 06/17/2012  . Peripheral vascular disease, unspecified ([place]) 06/02/2012  . Urticaria 10/15/2011  . Anemia 09/21/2011  . 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  . Vitamin D deficiency 02/12/2011  . Peripheral neuropathy 02/12/2011  . Intertrigo 01/21/2011  . 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 History of chicken pox 01/14/2011  . History of measles 01/14/2011  . HAND PAIN 04/09/2010  . OTHER URINARY INCONTINENCE 03/12/2010  . Depression with anxiety 03/06/2008  . Essential hypertension 02/22/2008  . Chronic bronchitis ([place]) 02/22/2008  . DIVERTICULOSIS OF COLON 02/22/2008  . Unilateral primary osteoarthritis, left knee 02/22/2008  . Hypothyroidism 08/23/2007  . Obesity 08/23/2007  . OSA on CPAP 08/23/2007  . Venous (peripheral) insufficiency 08/23/2007  . Allergic rhinitis 08/23/2007  . GERD without esophagitis 08/23/2007  . BACK PAIN, LUMBAR 08/23/2007    Social History   Tobacco Use  . Smoking status: Former Smoker    Packs/day: 1.50    Years: 30.00    Pack years: 45.00    Types: Cigarettes    Quit date: 10/13/1991    Years since quitting: 29.0  . Smokeless tobacco: Never Used  Substance Use Topics  . Alcohol use: No    Alcohol/week: 0.0 standard drinks    Current Outpatient Medications:  .  albuterol (VENTOLIN HFA) 108 (90 Base) MCG/ACT inhaler, Inhale 2 puffs into the lungs every 6 (six) hours as needed. For wheezing (Patient taking differently: Inhale 2 puffs into the lungs every 6 (six) hours as needed for wheezing  or shortness of breath.), Disp: 6.7 g, Rfl: 5 .  ALPRAZolam (XANAX) 0.5 MG tablet, TAKE 1/2 TO 1 TABLET BY MOUTH 3 TIMES DAILY AS NEEDED FOR ANXIETY- Last refill without appt (Patient taking differently: Take 0.5 mg by mouth 3 (three) times daily as needed for anxiety or sleep.), Disp: 90 tablet, Rfl: 0 .  amLODipine (NORVASC) 5 MG tablet, Take 1 tablet (5 mg total)  by mouth daily., Disp: 90 tablet, Rfl: 0 .  ascorbic acid (VITAMIN C) 500 MG tablet, Take 1 tablet (500 mg total) by mouth daily., Disp: 30 tablet, Rfl: 0 .  benzonatate (TESSALON) 200 MG capsule, Take 1 capsule (200 mg total) by mouth 3 (three) times daily as needed for cough., Disp: 45 capsule, Rfl: 0 .  budesonide-formoterol (SYMBICORT) 160-4.5 MCG/ACT inhaler, Inhale 2 puffs into the lungs 2 (two) times daily., Disp: 30.6 g, Rfl: 3 .  cholecalciferol (VITAMIN D) 25 MCG tablet, Take 1 tablet (1,000 Units total) by mouth daily., Disp: 30 tablet, Rfl: 0 .  dupilumab (DUPIXENT) 300 MG/2ML prefilled syringe, Inject 300 mg into the skin every 14 (fourteen) days., Disp: 4 mL, Rfl: 11 .  escitalopram (LEXAPRO) 5 MG tablet, TAKE 1 TABLET BY MOUTH EVERY DAY (Patient taking differently: Take 5 mg by mouth daily.), Disp: 90 tablet, Rfl: 0 .  fluticasone (FLONASE) 50 MCG/ACT nasal spray, PLACE 1 SPRAY INTO BOTH NOSTRILS DAILY., Disp: 48 mL, Rfl: 1 .  levothyroxine (SYNTHROID) 112 MCG tablet, TAKE 1 TABLET BY MOUTH EVERY DAY (Patient taking differently: Take 112 mcg by mouth daily before breakfast.), Disp: 90 tablet, Rfl: 1 .  pantoprazole (PROTONIX) 40 MG tablet, TAKE 1 TABLET BY MOUTH EVERY DAY 30 MINUTES BEFORE DINNER, Disp: 90 tablet, Rfl: 3 .  zinc gluconate 50 MG tablet, Take by mouth., Disp: , Rfl:  .  zinc sulfate 220 (50 Zn) MG capsule, Take 1 capsule (220 mg total) by mouth daily., Disp: 30 capsule, Rfl: 0 .  Carboxymethylcellulose Sodium (REFRESH LIQUIGEL OP), Place 1 drop into both eyes daily as needed (dry eyes)., Disp: , Rfl:  .  cetirizine (ZYRTEC) 10 MG tablet, Take 10 mg by mouth daily as needed., Disp: , Rfl:  .  furosemide (LASIX) 20 MG tablet, Take 20 mg by mouth daily as needed for fluid. (Patient not taking: Reported on 10/15/2020), Disp: , Rfl:  .  meclizine (ANTIVERT) 25 MG tablet, Take 1 tablet (25 mg total) by mouth 3 (three) times daily as needed for up to 30 doses for dizziness.  (Patient not taking: Reported on 10/15/2020), Disp: 30 tablet, Rfl: 0 .  Respiratory Therapy Supplies (FLUTTER) DEVI, 1 Device by Does not apply route in the morning, at noon, and at bedtime. (Patient not taking: No sig reported), Disp: 1 each, Rfl: 0  Allergies  Allergen Reactions  . Prednisone Other (See Comments)    Confusion, dizziness Medrol is ok  . Codeine Nausea Only       . Tramadol Itching    Objective:   There were no vitals taken for this visit. AAOx3, NAD NCAT, EOMI No obvious CN deficits Coloring WNL Pt is able to speak clearly, coherently without shortness of breath or increased work of breathing.  Thought process is linear.  Mood is appropriate.   Assessment and Plan:   COVID infxn- thankfully pt has recovered rather quickly after her 5 day hospital stay.  This is likely due to the fact that she was vaccinated.  She is no longer needing O2.  Feels  energy level is improving.  Appetite is good.  Denies sxs of DVT or PE.  Reviewed supportive care and red flags that should prompt return.  Pt expressed understanding and is in agreement w/ plan.    Neena Rhymes, MD 10/15/2020

## 2020-10-15 NOTE — Progress Notes (Signed)
I connected with  Jenny [name] on 10/15/20 by a video enabled telemedicine application and verified that I am speaking with the correct person using two identifiers.   I discussed the limitations of evaluation and management by telemedicine. The patient expressed understanding and agreed to proceed.

## 2020-10-16 ENCOUNTER — Telehealth: Payer: Self-pay

## 2020-10-16 NOTE — Telephone Encounter (Signed)
Submitted VOB, SynviscOne, left knee. 

## 2020-10-17 NOTE — Progress Notes (Signed)
Chronic Care Management Pharmacy  Name: Jenny [name]   MRN: [id number]  DOB: Feb 27, 1941  Chief Complaint/HPI Jenny [name],  80 y.o., female presents for their Follow-Up CCM visit with the clinical pharmacist via telephone due to COVID-19 Pandemic.  PCP : Jenny Minium, MD Their chronic conditions include: HTN, Hypothyroidism, Anxiety and Depression, GERD  Patient Active Problem List   Diagnosis Date Noted  . COVID-19 virus infection 09/30/2020  . NSIP (nonspecific interstitial pneumonitis) ([place]) 09/30/2020  . 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 Abnormal CT of the chest 04/18/2020  . Cough 07/06/2018  . Peripheral edema 02/22/2017  . Localized edema 12/17/2016  . Colon cancer screening 06/15/2016  . COPD mixed type ([place]) 02/10/2016  . Hyperglycemia 07/07/2015  . Ceruminosis 03/07/2015  . Left hip pain 04/26/2014  . Acute pain of left knee 04/26/2014  . Annual physical exam 08/06/2013  . Atrophic vaginitis 06/17/2012  . Peripheral vascular disease, unspecified ([place]) 06/02/2012  . Urticaria 10/15/2011  . Anemia 09/21/2011  . 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  . Vitamin D deficiency 02/12/2011  . Peripheral neuropathy 02/12/2011  . Intertrigo 01/21/2011  . 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 History of chicken pox 01/14/2011  . History of measles 01/14/2011  . HAND PAIN 04/09/2010  . OTHER URINARY INCONTINENCE 03/12/2010  . Depression with anxiety 03/06/2008  . Essential hypertension 02/22/2008  . Chronic bronchitis ([place]) 02/22/2008  . DIVERTICULOSIS OF COLON 02/22/2008  . Unilateral primary osteoarthritis, left knee 02/22/2008  . Hypothyroidism 08/23/2007  . Obesity 08/23/2007  . OSA on CPAP 08/23/2007  . Venous (peripheral) insufficiency 08/23/2007  . Allergic rhinitis 08/23/2007  . GERD without esophagitis 08/23/2007  . BACK PAIN, LUMBAR 08/23/2007   Past Surgical History:   Procedure Laterality Date  . ABDOMINAL HYSTERECTOMY     took one ovary  . AP repair and sling for cystocele  8-04    Dr Gaynelle Arabian  . APPENDECTOMY    . carpal tunel  2008  . CHOLECYSTECTOMY    . ctr     bilateral  . EYE SURGERY    . left hand  2010   left thumb, joint removal, CTR  . NECK SURGERY     x 2 - discectomy, fusion  . SHOULDER ARTHROSCOPY DISTAL CLAVICLE EXCISION AND OPEN ROTATOR CUFF REPAIR     right  . TONSILLECTOMY AND ADENOIDECTOMY    . TUBAL LIGATION     Social History   Socioeconomic History  . Marital status: Widowed    Spouse name: Not on file  . Number of children: Not on file  . Years of education: Not on file  . Highest education level: Not on file  Occupational History  . Occupation: Retired  Tobacco Use  . Smoking status: Former Smoker    Packs/day: 1.50    Years: 30.00    Pack years: 45.00    Types: Cigarettes    Quit date: 10/13/1991    Years since quitting: 29.0  . Smokeless tobacco: Never Used  Vaping Use  . Vaping Use: Never used  Substance and Sexual Activity  . Alcohol use: No    Alcohol/week: 0.0 standard drinks  . Drug use: No  . Sexual activity: Never  Other Topics Concern  . Not on file  Social History Narrative  . Not on file   Social Determinants of Health   Financial Resource Strain: Medium Risk  . Difficulty of Paying Living Expenses: Somewhat  hard  Food Insecurity: No Food Insecurity  . Worried About Charity fundraiser in the Last Year: Never true  . Ran Out of Food in the Last Year: Never true  Transportation Needs: No Transportation Needs  . Lack of Transportation (Medical): No  . Lack of Transportation (Non-Medical): No  Physical Activity: Inactive  . Days of Exercise per Week: 0 days  . Minutes of Exercise per Session: 0 min  Stress: No Stress Concern Present  . Feeling of Stress : Not at all  Social Connections: Moderately Integrated  . Frequency of Communication with Friends and Family: More than three  times a week  . Frequency of Social Gatherings with Friends and Family: More than three times a week  . Attends Religious Services: 1 to 4 times per year  . Active Member of Clubs or Organizations: Yes  . Attends Archivist Meetings: 1 to 4 times per year  . Marital Status: Widowed   Family History  Problem Relation Age of Onset  . COPD Mother   . Pneumonia Mother   . Other Mother        CHF  . Hypertension Mother   . Arthritis Mother        septic knee after replacement  . Heart disease Mother        chf  . Dementia Father   . Arthritis Brother   . Asthma Daughter   . Colon polyps Daughter   . Stroke Maternal Grandmother   . Heart disease Maternal Grandmother   . Other Maternal Grandfather        CHF  . Heart disease Maternal Grandfather        chf  . Cancer Paternal Grandmother        stomach?  . Other Paternal Grandfather        problems with kidneys  . Kidney disease Paternal Grandfather   . Anxiety disorder Daughter   . Cancer Maternal Aunt        breast  . Cancer Maternal Aunt        liver  . Colon cancer Neg Hx    Allergies  Allergen Reactions  . Prednisone Other (See Comments)    Confusion, dizziness Medrol is ok  . Codeine Nausea Only       . Tramadol Itching   Outpatient Encounter Medications as of 10/18/2020  Medication Sig Note  . albuterol (VENTOLIN HFA) 108 (90 Base) MCG/ACT inhaler Inhale 2 puffs into the lungs every 6 (six) hours as needed. For wheezing (Patient taking differently: Inhale 2 puffs into the lungs every 6 (six) hours as needed for wheezing or shortness of breath.)   . ALPRAZolam (XANAX) 0.5 MG tablet TAKE 1/2 TO 1 TABLET BY MOUTH 3 TIMES DAILY AS NEEDED FOR ANXIETY- Last refill without appt (Patient taking differently: Take 0.5 mg by mouth 3 (three) times daily as needed for anxiety or sleep.) 09/30/2020: LF 09/23/20  . amLODipine (NORVASC) 5 MG tablet Take 1 tablet (5 mg total) by mouth daily.   Marland Kitchen ascorbic acid (VITAMIN C)  500 MG tablet Take 1 tablet (500 mg total) by mouth daily.   . benzonatate (TESSALON) 200 MG capsule Take 1 capsule (200 mg total) by mouth 3 (three) times daily as needed for cough.   . budesonide-formoterol (SYMBICORT) 160-4.5 MCG/ACT inhaler Inhale 2 puffs into the lungs 2 (two) times daily.   . Carboxymethylcellulose Sodium (REFRESH LIQUIGEL OP) Place 1 drop into both eyes daily as needed (dry eyes).   Marland Kitchen  cetirizine (ZYRTEC) 10 MG tablet Take 10 mg by mouth daily as needed.   . cholecalciferol (VITAMIN D) 25 MCG tablet Take 1 tablet (1,000 Units total) by mouth daily.   . dupilumab (DUPIXENT) 300 MG/2ML prefilled syringe Inject 300 mg into the skin every 14 (fourteen) days.   Marland Kitchen escitalopram (LEXAPRO) 5 MG tablet TAKE 1 TABLET BY MOUTH EVERY DAY (Patient taking differently: Take 5 mg by mouth daily.)   . fluticasone (FLONASE) 50 MCG/ACT nasal spray PLACE 1 SPRAY INTO BOTH NOSTRILS DAILY.   . furosemide (LASIX) 20 MG tablet Take 20 mg by mouth daily as needed for fluid. (Patient not taking: Reported on 10/15/2020)   . levothyroxine (SYNTHROID) 112 MCG tablet TAKE 1 TABLET BY MOUTH EVERY DAY (Patient taking differently: Take 112 mcg by mouth daily before breakfast.) 09/30/2020: LF 09/26/2020  . meclizine (ANTIVERT) 25 MG tablet Take 1 tablet (25 mg total) by mouth 3 (three) times daily as needed for up to 30 doses for dizziness. (Patient not taking: Reported on 10/15/2020)   . pantoprazole (PROTONIX) 40 MG tablet TAKE 1 TABLET BY MOUTH EVERY DAY 30 MINUTES BEFORE DINNER   . Respiratory Therapy Supplies (FLUTTER) DEVI 1 Device by Does not apply route in the morning, at noon, and at bedtime. (Patient not taking: No sig reported)   . zinc gluconate 50 MG tablet Take by mouth.   . zinc sulfate 220 (50 Zn) MG capsule Take 1 capsule (220 mg total) by mouth daily.   . [DISCONTINUED] esomeprazole (NEXIUM) 40 MG capsule Take 40 mg by mouth daily before breakfast.   12/18/2011: Dr Trixie Deis put pt on Zantac   No  facility-administered encounter medications on file as of 10/18/2020.   Patient Care Team    Relationship Specialty Notifications Start End  Sheliah Hatch, MD PCP - General Family Medicine  11/09/16   Michele Mcalpine, MD Consulting Physician Pulmonary Disease  11/09/16   Meryl Dare, MD Consulting Physician Gastroenterology  11/09/16   Elinor Parkinson, [place] Consulting Physician Podiatry  06/03/17   Marcelle Overlie, MD Consulting Physician Obstetrics and Gynecology  06/03/17   Dahlia Byes, [facility] Pharmacist Pharmacist  01/09/20    Comment: phone number [phone]   Current Diagnosis/Assessment:  Goals Addressed            This Visit's Progress   . PharmD Care Plan   On track    CARE PLAN ENTRY  Current Barriers:  . Chronic Disease Management support, education, and care coordination needs related to HTN and hypothyroidism , GERD, depression.  Pharmacist Clinical Goal(s):  . BP <140/90 . TSH 0.35-4.5 . Receive Shingrix vaccine series over the next year  Interventions: . Comprehensive medication review performed. . Will refile for patient assistance with patient.  Patient Self Care Activities:  . Fill out your section of patient assistance application and drop off with Dr. Craige Cotta next appointment.  . Patient verbalizes understanding of plan to work with pharmacist on patient assistance. . Try keeping an eye on your home blood pressure, checking at least once every 1-2 weeks. Document and provide at future visits.  . Please call me with any questions at [phone].  Initial goal documentation.      Hypertension / Edema    Goal <140/90  Office blood pressures are  BP Readings from Last 3 Encounters:  10/04/20 139/73  06/19/20 120/70  05/07/20 (!) 159/86   Doing chair exercises at home, feeling somewhat fatigued after being hospitalized with COVID.  Denies dizziness, BP at goal.   Amlodipine 5 mg daily   Furosemide 20 mg two tablets daily as needed for swelling  in ankles  Discussed diet/exercise - Maintain a healthy weight and exercise regularly, as directed by your health care provider. Eat healthy foods, such as: Lean proteins, complex carbohydrates, fresh fruits and vegetables, low-fat dairy products, healthy fats.  Plan  Continue current medications.   COPD, mixed type   IgE 01/30/20 >> 40, Absolute eosinophils 600 PFT 04/17/20 >> FEV1 1.30 (65%), FEV1% 79, TLC 6.97 (133%), RV 5.11 (207%), DLCO 88% Serology 04/17/20 >> ANA, HP panel, anti CCP negative; RF 25 Dupixent started 05/2020.   Feels breathing has signficantly improved after starting on Dupixent. Has completed Symbicort patient assistance application, confirmed today that she has been approved. Also receiving financial assistance for Dupixent.Grant for [employer].  Patient is currently controlled on the following medications:  Marland Kitchen Symbicort 160-4.5 two puffs into the lungs two times daily . Ventolin inhaler, which is used as 2 puffs every 6 hours as needed  Needing PAP application for Symbicort.   Plan  Continue current management.  Anxiety and Depression   PHQ 9:0.   States ongoing benefit from medications, concerned with cost of [facility]ro [facility], inquires on cost.   Patient is currently controlled on the following medications:   Escitalopram 5mg  daily   Alprazolam 0.5 mg take 1/2 tablet by mouth three times daily as needed for anxiety  Medication costs reviewed - would be cheaper to use [facility] mail order - pharmacist to request refills on maintenance medications.  Plan  Continue current medications.  Vaccines   Immunization History  Administered Date(s) Administered  . Fluad Quad(high Dose 65+) 08/02/2019, 08/05/2020  . Influenza Split 08/20/2011, 09/30/2012  . Influenza Whole 08/13/2013  . Influenza, High Dose Seasonal PF 10/13/2014, 09/03/2015, 06/03/2017, 09/25/2018  . Influenza-Unspecified 07/11/2016  . Moderna Sars-Covid-2 Vaccination  11/17/2019, 12/24/2019  . Pneumococcal Conjugate-13 03/16/2014  . Pneumococcal Polysaccharide-23 06/23/2016  . Tdap 09/22/2011  . Zoster 03/16/2014   Vaccines reviewed.  Plan  Recommended patient receive Shingrix vaccine in office/pharmacy.   Medication Management   [facility] medications [facility]:  [id number] [place], [place] - [address] [place] [address] Phone: [phone] Fax: [phone]  [place], [address], [address] [place], [place] [address] Phone: [phone] Fax: [phone]   Requested Rx refills on lexapro, levothyroxine, amlodipine to be sent to [facility].  Plan  Continue current medication management strategy.  Follow up: 6 month phone visit.  COPD, Depression, HTN __________  [name], Pharm.D., BCGP Clinical Pharmacist [facility] at [facility] [phone]

## 2020-10-18 ENCOUNTER — Ambulatory Visit: Payer: Medicare HMO

## 2020-10-18 ENCOUNTER — Other Ambulatory Visit: Payer: Self-pay

## 2020-10-18 DIAGNOSIS — F418 Other specified anxiety disorders: Secondary | ICD-10-CM

## 2020-10-18 DIAGNOSIS — J449 Chronic obstructive pulmonary disease, unspecified: Secondary | ICD-10-CM

## 2020-10-18 DIAGNOSIS — I10 Essential (primary) hypertension: Secondary | ICD-10-CM

## 2020-10-18 MED ORDER — AMLODIPINE BESYLATE 5 MG PO TABS: 5.0000 mg | ORAL_TABLET | Freq: Every day | ORAL | 0 refills | Status: DC

## 2020-10-18 MED ORDER — LEVOTHYROXINE SODIUM 112 MCG PO TABS: 112.0000 ug | ORAL_TABLET | Freq: Every day | ORAL | 0 refills | Status: DC

## 2020-10-18 NOTE — Patient Instructions (Signed)
Please review care plan below and call me at [phone] (direct line) with any questions!  Thank you, [name], Clinical Pharmacist  Goals Addressed            This Visit's Progress   . PharmD Care Plan   On track    CARE PLAN ENTRY  Current Barriers:  . Chronic Disease Management support, education, and care coordination needs related to HTN and hypothyroidism , GERD, depression.  Pharmacist Clinical Goal(s):  . BP <140/90 . TSH 0.35-4.5 . Receive Shingrix vaccine series over the next year  Interventions: . Comprehensive medication review performed. . Will refile for patient assistance with patient.  Patient Self Care Activities:  . Fill out your section of patient assistance application and drop off with Dr. Halford Chessman next appointment.  . Patient verbalizes understanding of plan to work with pharmacist on patient assistance. . Try keeping an eye on your home blood pressure, checking at least once every 1-2 weeks. Document and provide at future visits.  . Please call me with any questions at [phone].  Initial goal documentation.      [name] was given information about Chronic Care Management services today including:  1. CCM service includes personalized support from designated clinical staff supervised by her physician, including individualized plan of care and coordination with other care providers 2. 24/7 contact phone numbers for assistance for urgent and routine care needs. 3. Standard insurance, coinsurance, copays and deductibles apply for chronic care management only during months in which we provide at least 20 minutes of these services. Most insurances cover these services at 100%, however patients may be responsible for any copay, coinsurance and/or deductible if applicable. This service may help you avoid the need for more expensive face-to-face services. 4. Only one practitioner may furnish and bill the service in a calendar month. 5. The patient may stop  CCM services at any time (effective at the end of the month) by phone call to the office staff.  Patient agreed to services and verbal consent obtained.   The patient verbalized understanding of instructions provided today and agreed to receive a mailed copy of patient instruction and/or educational materials. Telephone follow up appointment with pharmacy team member scheduled for: See next appointment with "Care Management Staff" under "What's Next" below.   [name], Pharm.D., BCGP Clinical Pharmacist [facility] [phone]

## 2020-10-22 ENCOUNTER — Other Ambulatory Visit: Payer: Self-pay

## 2020-10-22 ENCOUNTER — Ambulatory Visit (INDEPENDENT_AMBULATORY_CARE_PROVIDER_SITE_OTHER): Payer: Medicare Other

## 2020-10-22 DIAGNOSIS — J4550 Severe persistent asthma, uncomplicated: Secondary | ICD-10-CM | POA: Diagnosis not present

## 2020-10-22 MED ORDER — DUPILUMAB 300 MG/2ML ~~LOC~~ SOSY
300.0000 mg | PREFILLED_SYRINGE | Freq: Once | SUBCUTANEOUS | Status: AC
  Administered 2020-10-22: 300 mg via SUBCUTANEOUS

## 2020-10-22 NOTE — Progress Notes (Signed)
Have you been hospitalized within the last 10 days?  No Do you have a fever?  No Do you have a cough?  No Do you have a headache or sore throat? No Do you have your Epi Pen visible and is it within date?  Yes 

## 2020-10-29 ENCOUNTER — Telehealth: Payer: Self-pay | Admitting: Pulmonary Disease

## 2020-10-29 NOTE — Telephone Encounter (Signed)
Dupixent Order: 300mg  #2 prefilled syringe Ordered Date: 10/29/20 Expected date of arrival: 10/31/20 Ordered by: [place]: [name]

## 2020-10-31 NOTE — Telephone Encounter (Signed)
Dupixent Shipment Received: 300mg  #2 prefilled syringe Medication arrival date: 10/31/20 Lot #: [id number] Exp date: 03/12/2023 Received by: Elliot Dally

## 2020-11-05 ENCOUNTER — Ambulatory Visit: Payer: Medicare Other

## 2020-11-05 ENCOUNTER — Other Ambulatory Visit: Payer: Self-pay

## 2020-11-05 ENCOUNTER — Ambulatory Visit (INDEPENDENT_AMBULATORY_CARE_PROVIDER_SITE_OTHER): Payer: Medicare Other

## 2020-11-05 DIAGNOSIS — J4550 Severe persistent asthma, uncomplicated: Secondary | ICD-10-CM | POA: Diagnosis not present

## 2020-11-05 MED ORDER — DUPILUMAB 300 MG/2ML ~~LOC~~ SOSY
300.0000 mg | PREFILLED_SYRINGE | Freq: Once | SUBCUTANEOUS | Status: AC
  Administered 2020-11-05: 300 mg via SUBCUTANEOUS

## 2020-11-05 NOTE — Progress Notes (Signed)
Have you been hospitalized within the last 10 days?  No Do you have a fever?  No Do you have a cough?  No Do you have a headache or sore throat? No Do you have your Epi Pen visible and is it within date?  Yes 

## 2020-11-17 ENCOUNTER — Other Ambulatory Visit: Payer: Self-pay | Admitting: Family Medicine

## 2020-11-18 ENCOUNTER — Other Ambulatory Visit: Payer: Self-pay | Admitting: Family Medicine

## 2020-11-18 ENCOUNTER — Telehealth: Payer: Self-pay

## 2020-11-18 NOTE — Telephone Encounter (Signed)
Tried to call patient and leave a VM, but no answer and was not able to leave a message due to VM being full.  Will need an appointment with Dr. Marlou Sa for gel injection.  Approved, SynviscOne, left knee. Jenny [name] Patient will be responsible for 20% OOP. Co-pay of $30.00 required No PA required

## 2020-11-19 ENCOUNTER — Ambulatory Visit (INDEPENDENT_AMBULATORY_CARE_PROVIDER_SITE_OTHER): Payer: Medicare Other

## 2020-11-19 ENCOUNTER — Other Ambulatory Visit: Payer: Self-pay

## 2020-11-19 DIAGNOSIS — J4550 Severe persistent asthma, uncomplicated: Secondary | ICD-10-CM

## 2020-11-19 MED ORDER — DUPILUMAB 300 MG/2ML ~~LOC~~ SOSY
300.0000 mg | PREFILLED_SYRINGE | Freq: Once | SUBCUTANEOUS | Status: AC
  Administered 2020-11-19: 300 mg via SUBCUTANEOUS

## 2020-11-19 NOTE — Progress Notes (Signed)
Have you been hospitalized within the last 10 days?  No Do you have a fever?  No Do you have a cough?  No Do you have a headache or sore throat? No  

## 2020-11-23 ENCOUNTER — Other Ambulatory Visit: Payer: Self-pay | Admitting: Family Medicine

## 2020-11-25 ENCOUNTER — Telehealth: Payer: Self-pay | Admitting: Pulmonary Disease

## 2020-11-25 NOTE — Telephone Encounter (Signed)
Dupixent Order: 300mg  #2 prefilled syringe Ordered Date: 11/25/20 Expected date of arrival: 11/27/20 Ordered by: [place]: Jenny [name]

## 2020-11-27 DIAGNOSIS — Q2546 Tortuous aortic arch: Secondary | ICD-10-CM | POA: Diagnosis not present

## 2020-11-27 DIAGNOSIS — Z885 Allergy status to narcotic agent status: Secondary | ICD-10-CM | POA: Diagnosis not present

## 2020-11-27 DIAGNOSIS — R9431 Abnormal electrocardiogram [ECG] [EKG]: Secondary | ICD-10-CM | POA: Diagnosis not present

## 2020-11-27 DIAGNOSIS — J45909 Unspecified asthma, uncomplicated: Secondary | ICD-10-CM | POA: Diagnosis not present

## 2020-11-27 DIAGNOSIS — R918 Other nonspecific abnormal finding of lung field: Secondary | ICD-10-CM | POA: Diagnosis not present

## 2020-11-27 DIAGNOSIS — R079 Chest pain, unspecified: Secondary | ICD-10-CM | POA: Diagnosis not present

## 2020-11-27 DIAGNOSIS — E079 Disorder of thyroid, unspecified: Secondary | ICD-10-CM | POA: Diagnosis not present

## 2020-11-27 DIAGNOSIS — I10 Essential (primary) hypertension: Secondary | ICD-10-CM | POA: Diagnosis not present

## 2020-11-27 DIAGNOSIS — G8929 Other chronic pain: Secondary | ICD-10-CM | POA: Diagnosis not present

## 2020-11-27 DIAGNOSIS — M25512 Pain in left shoulder: Secondary | ICD-10-CM | POA: Diagnosis not present

## 2020-11-27 DIAGNOSIS — K219 Gastro-esophageal reflux disease without esophagitis: Secondary | ICD-10-CM | POA: Diagnosis not present

## 2020-11-27 DIAGNOSIS — Z888 Allergy status to other drugs, medicaments and biological substances status: Secondary | ICD-10-CM | POA: Diagnosis not present

## 2020-11-27 DIAGNOSIS — I517 Cardiomegaly: Secondary | ICD-10-CM | POA: Diagnosis not present

## 2020-11-27 NOTE — Telephone Encounter (Signed)
Dupixent Shipment Received: 300mg  #2 prefilled syringe Medication arrival date: 11/27/20 Lot #: [id number] Exp date: 03/11/2023 Received by: Elliot Dally

## 2020-11-28 ENCOUNTER — Telehealth: Payer: Self-pay | Admitting: Orthopedic Surgery

## 2020-11-28 NOTE — Telephone Encounter (Signed)
IC s/w patient advised I could work her in but would likely have wait due to schedule. She decided she would just like an appt for Dr Randel Pigg next available.

## 2020-11-28 NOTE — Telephone Encounter (Signed)
Patient called requesting a call back. Patient went to [facility] ED last night for chest pains and they told her it's her left shoulder pain. Patient is asking to be added to Dr. Marlou Sa schedule today if possible. Please call patient at [phone].

## 2020-12-02 DIAGNOSIS — M25512 Pain in left shoulder: Secondary | ICD-10-CM | POA: Diagnosis not present

## 2020-12-02 DIAGNOSIS — M542 Cervicalgia: Secondary | ICD-10-CM | POA: Diagnosis not present

## 2020-12-02 DIAGNOSIS — M7712 Lateral epicondylitis, left elbow: Secondary | ICD-10-CM | POA: Diagnosis not present

## 2020-12-03 ENCOUNTER — Other Ambulatory Visit: Payer: Self-pay

## 2020-12-03 ENCOUNTER — Ambulatory Visit (INDEPENDENT_AMBULATORY_CARE_PROVIDER_SITE_OTHER): Payer: Medicare Other

## 2020-12-03 DIAGNOSIS — J4550 Severe persistent asthma, uncomplicated: Secondary | ICD-10-CM

## 2020-12-03 MED ORDER — DUPILUMAB 300 MG/2ML ~~LOC~~ SOSY
300.0000 mg | PREFILLED_SYRINGE | Freq: Once | SUBCUTANEOUS | Status: AC
  Administered 2020-12-03: 300 mg via SUBCUTANEOUS

## 2020-12-03 NOTE — Progress Notes (Signed)
Have you been hospitalized within the last 10 days?  No Do you have a fever?  No Do you have a cough?  No Do you have a headache or sore throat? No Do you have your Epi Pen visible and is it within date?  Yes 

## 2020-12-04 ENCOUNTER — Telehealth: Payer: Self-pay

## 2020-12-04 ENCOUNTER — Encounter: Payer: Self-pay | Admitting: Family Medicine

## 2020-12-04 ENCOUNTER — Telehealth (INDEPENDENT_AMBULATORY_CARE_PROVIDER_SITE_OTHER): Payer: Medicare Other | Admitting: Family Medicine

## 2020-12-04 DIAGNOSIS — B029 Zoster without complications: Secondary | ICD-10-CM | POA: Diagnosis not present

## 2020-12-04 MED ORDER — VALACYCLOVIR HCL 1 G PO TABS: 1000.0000 mg | ORAL_TABLET | Freq: Three times a day (TID) | ORAL | 0 refills | Status: DC

## 2020-12-04 NOTE — Progress Notes (Signed)
I connected with  Jenny [name] on 12/04/20 by a video enabled telemedicine application and verified that I am speaking with the correct person using two identifiers.   I discussed the limitations of evaluation and management by telemedicine. The patient expressed understanding and agreed to proceed.

## 2020-12-04 NOTE — Telephone Encounter (Signed)
Patient is calling in stating she forgot to ask what she can take for the pain for shingles.

## 2020-12-04 NOTE — Progress Notes (Signed)
Virtual Visit via Video   I connected with patient on 12/04/20 at  2:30 PM EST by a video enabled telemedicine application and verified that I am speaking with the correct person using two identifiers.  Location patient: Home Location provider: [facility], Office Persons participating in the virtual visit: Patient, Provider, [place] (Sabrina M)  I discussed the limitations of evaluation and management by telemedicine and the availability of in person appointments. The patient expressed understanding and agreed to proceed.  Subjective:   HPI:   Rash- pt reports she developed L arm pain last week after burying her grandson.  Went to ER and had a negative workup.  Went to ortho and had negative xrays.  Reports her elbow was very tender to touch.  Pt then noticed she had a cluster of blisters on palm of hand, side of hand, and now extending up arm.  Blisters first noticed 2/21.  Very painful.  ROS:   See pertinent positives and negatives per HPI.  Patient Active Problem List   Diagnosis Date Noted  . COVID-19 virus infection 09/30/2020  . NSIP (nonspecific interstitial pneumonitis) ([place]) 09/30/2020  . 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 Abnormal CT of the chest 04/18/2020  . Cough 07/06/2018  . Peripheral edema 02/22/2017  . Localized edema 12/17/2016  . Colon cancer screening 06/15/2016  . COPD mixed type ([place]) 02/10/2016  . Hyperglycemia 07/07/2015  . Ceruminosis 03/07/2015  . Left hip pain 04/26/2014  . Acute pain of left knee 04/26/2014  . Annual physical exam 08/06/2013  . Atrophic vaginitis 06/17/2012  . Peripheral vascular disease, unspecified ([place]) 06/02/2012  . Urticaria 10/15/2011  . Anemia 09/21/2011  . 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  . Vitamin D deficiency 02/12/2011  . Peripheral neuropathy 02/12/2011  . Intertrigo 01/21/2011  . Eosinophilic asthma 48/54/6270  . 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 History of  chicken pox 01/14/2011  . History of measles 01/14/2011  . HAND PAIN 04/09/2010  . OTHER URINARY INCONTINENCE 03/12/2010  . Depression with anxiety 03/06/2008  . Essential hypertension 02/22/2008  . Chronic bronchitis ([place]) 02/22/2008  . DIVERTICULOSIS OF COLON 02/22/2008  . Unilateral primary osteoarthritis, left knee 02/22/2008  . Hypothyroidism 08/23/2007  . Obesity 08/23/2007  . OSA on CPAP 08/23/2007  . Venous (peripheral) insufficiency 08/23/2007  . Allergic rhinitis 08/23/2007  . GERD without esophagitis 08/23/2007  . BACK PAIN, LUMBAR 08/23/2007    Social History   Tobacco Use  . Smoking status: Former Smoker    Packs/day: 1.50    Years: 30.00    Pack years: 45.00    Types: Cigarettes    Quit date: 10/13/1991    Years since quitting: 29.1  . Smokeless tobacco: Never Used  Substance Use Topics  . Alcohol use: No    Alcohol/week: 0.0 standard drinks    Current Outpatient Medications:  .  albuterol (VENTOLIN HFA) 108 (90 Base) MCG/ACT inhaler, Inhale 2 puffs into the lungs every 6 (six) hours as needed. For wheezing (Patient taking differently: Inhale 2 puffs into the lungs every 6 (six) hours as needed for wheezing or shortness of breath.), Disp: 6.7 g, Rfl: 5 .  ALPRAZolam (XANAX) 0.5 MG tablet, TAKE 1/2 TO 1 TABLET BY MOUTH 3 TIMES DAILY AS NEEDED FOR ANXIETY, Disp: 90 tablet, Rfl: 0 .  amLODipine (NORVASC) 5 MG tablet, TAKE 1 TABLET BY MOUTH  DAILY, Disp: 90 tablet, Rfl: 3 .  benzonatate (TESSALON) 200 MG capsule, Take 1 capsule (200 mg total)  by mouth 3 (three) times daily as needed for cough., Disp: 45 capsule, Rfl: 0 .  budesonide-formoterol (SYMBICORT) 160-4.5 MCG/ACT inhaler, Inhale 2 puffs into the lungs 2 (two) times daily., Disp: 30.6 g, Rfl: 3 .  cetirizine (ZYRTEC) 10 MG tablet, Take 10 mg by mouth daily as needed., Disp: , Rfl:  .  cholecalciferol (VITAMIN D) 25 MCG tablet, Take 1 tablet (1,000 Units total) by mouth daily., Disp: 30 tablet, Rfl: 0 .   dupilumab (DUPIXENT) 300 MG/2ML prefilled syringe, Inject 300 mg into the skin every 14 (fourteen) days., Disp: 4 mL, Rfl: 11 .  fluticasone (FLONASE) 50 MCG/ACT nasal spray, PLACE 1 SPRAY INTO BOTH NOSTRILS DAILY., Disp: 48 mL, Rfl: 1 .  levothyroxine (SYNTHROID) 112 MCG tablet, TAKE 1 TABLET BY MOUTH  DAILY, Disp: 90 tablet, Rfl: 3 .  pantoprazole (PROTONIX) 40 MG tablet, TAKE 1 TABLET BY MOUTH EVERY DAY 30 MINUTES BEFORE DINNER, Disp: 90 tablet, Rfl: 3 .  ascorbic acid (VITAMIN C) 500 MG tablet, Take 1 tablet (500 mg total) by mouth daily. (Patient not taking: Reported on 12/04/2020), Disp: 30 tablet, Rfl: 0 .  Carboxymethylcellulose Sodium (REFRESH LIQUIGEL OP), Place 1 drop into both eyes daily as needed (dry eyes). (Patient not taking: Reported on 12/04/2020), Disp: , Rfl:  .  escitalopram (LEXAPRO) 5 MG tablet, TAKE 1 TABLET BY MOUTH EVERY DAY (Patient not taking: Reported on 12/04/2020), Disp: 90 tablet, Rfl: 0 .  furosemide (LASIX) 20 MG tablet, Take 20 mg by mouth daily as needed for fluid. (Patient not taking: No sig reported), Disp: , Rfl:  .  meclizine (ANTIVERT) 25 MG tablet, Take 1 tablet (25 mg total) by mouth 3 (three) times daily as needed for up to 30 doses for dizziness. (Patient not taking: No sig reported), Disp: 30 tablet, Rfl: 0 .  Respiratory Therapy Supplies (FLUTTER) DEVI, 1 Device by Does not apply route in the morning, at noon, and at bedtime. (Patient not taking: No sig reported), Disp: 1 each, Rfl: 0 .  zinc gluconate 50 MG tablet, Take by mouth. (Patient not taking: Reported on 12/04/2020), Disp: , Rfl:  .  zinc sulfate 220 (50 Zn) MG capsule, Take 1 capsule (220 mg total) by mouth daily. (Patient not taking: Reported on 12/04/2020), Disp: 30 capsule, Rfl: 0  Allergies  Allergen Reactions  . Prednisone Other (See Comments)    Confusion, dizziness Medrol is ok  . Codeine Nausea Only       . Tramadol Itching    Objective:   There were no vitals taken for this  visit. AAOx3, NAD Obese NCAT, EOMI No obvious CN deficits Coloring WNL Cluster of vesicles on L palm, lateral hand Pt is able to speak clearly, coherently without shortness of breath or increased work of breathing.  Thought process is linear.  Mood is appropriate.   Assessment and Plan:   Shingles- new.  Likely triggered by the stress of her grandson's overdose at her home.  Will start Valtrex immediately.  Reviewed supportive care and red flags that should prompt return.  Pt expressed understanding and is in agreement w/ plan.    [name], MD 12/04/2020

## 2020-12-05 ENCOUNTER — Other Ambulatory Visit: Payer: Self-pay

## 2020-12-05 DIAGNOSIS — R52 Pain, unspecified: Secondary | ICD-10-CM

## 2020-12-05 MED ORDER — GABAPENTIN 100 MG PO CAPS: 100.0000 mg | ORAL_CAPSULE | Freq: Three times a day (TID) | ORAL | 0 refills | Status: DC

## 2020-12-05 MED ORDER — DUPIXENT 300 MG/2ML ~~LOC~~ SOAJ: 300.0000 mg | SUBCUTANEOUS | 11 refills | Status: DC

## 2020-12-05 NOTE — Telephone Encounter (Signed)
Called and spoke with patient about concerns. Patient understood. Gabapentin 100mg  #45 0 refills sent to patient pharmacy. No further concerns at this time.

## 2020-12-05 NOTE — Telephone Encounter (Signed)
We typically use tylenol or ibuprofen.  She can also get topical lidocaine at the pharmacy to help w/ the pain.  We can also start Gabapentin 100mg  TID as needed for pain, #45, no refills

## 2020-12-05 NOTE — Telephone Encounter (Signed)
Patient would like to know what she can take for shingles? Please advise

## 2020-12-05 NOTE — Addendum Note (Signed)
Addended by: Elton Sin on: 12/05/2020 02:06 PM   Modules accepted: Orders

## 2020-12-09 ENCOUNTER — Encounter: Payer: Self-pay | Admitting: Family Medicine

## 2020-12-13 ENCOUNTER — Ambulatory Visit: Payer: Medicare Other | Admitting: Orthopedic Surgery

## 2020-12-17 ENCOUNTER — Other Ambulatory Visit: Payer: Self-pay

## 2020-12-17 ENCOUNTER — Ambulatory Visit (INDEPENDENT_AMBULATORY_CARE_PROVIDER_SITE_OTHER): Payer: Medicare Other

## 2020-12-17 DIAGNOSIS — J4550 Severe persistent asthma, uncomplicated: Secondary | ICD-10-CM

## 2020-12-17 MED ORDER — DUPILUMAB 300 MG/2ML ~~LOC~~ SOSY
300.0000 mg | PREFILLED_SYRINGE | Freq: Once | SUBCUTANEOUS | Status: AC
Start: 2020-12-17 — End: 2020-12-17
  Administered 2020-12-17: 300 mg via SUBCUTANEOUS

## 2020-12-17 NOTE — Progress Notes (Signed)
Have you been hospitalized within the last 10 days?  No Do you have a fever?  No Do you have a cough?  No Do you have a headache or sore throat? No Do you have your Epi Pen visible and is it within date?  Yes   Patient demonstrated Dupixent pen injection with demo pen.  Patient given information of ordering and self injection sites.

## 2020-12-18 ENCOUNTER — Telehealth: Payer: Self-pay

## 2020-12-18 NOTE — Telephone Encounter (Signed)
I would, but im not taking transfers at this time or new patient.  D.r Rogers Blocker

## 2020-12-18 NOTE — Telephone Encounter (Signed)
Pt called asking if she could transfer care over to Dr. Rogers Blocker. Pt states she lives closer to [facility]. Ok to transfer? Please advise.

## 2020-12-18 NOTE — Telephone Encounter (Signed)
Ok to transfer. 

## 2020-12-19 ENCOUNTER — Other Ambulatory Visit: Payer: Self-pay | Admitting: Family Medicine

## 2020-12-19 DIAGNOSIS — R52 Pain, unspecified: Secondary | ICD-10-CM

## 2020-12-20 ENCOUNTER — Other Ambulatory Visit: Payer: Self-pay | Admitting: Pharmacist

## 2020-12-20 DIAGNOSIS — J8283 Eosinophilic asthma: Secondary | ICD-10-CM

## 2020-12-20 DIAGNOSIS — J4550 Severe persistent asthma, uncomplicated: Secondary | ICD-10-CM

## 2020-12-20 MED ORDER — DUPIXENT 300 MG/2ML ~~LOC~~ SOAJ: 300.0000 mg | SUBCUTANEOUS | 1 refills | Status: DC

## 2020-12-20 NOTE — Telephone Encounter (Signed)
Received refill request from [facility] for patient's Dupixent.  Dose: 300mg  SQ autoinjector every 14 days  Patient last seen in office on 06/19/20 by Dr. Halford Chessman. He had wanted 4 mo f/u. Will route to triage pool to schedule f/u visit.  Knox Saliva, PharmD, MPH Clinical Pharmacist (Rheumatology and Pulmonology)

## 2020-12-23 MED ORDER — DUPIXENT 300 MG/2ML ~~LOC~~ SOSY: 300.0000 mg | PREFILLED_SYRINGE | SUBCUTANEOUS | 1 refills | Status: DC

## 2020-12-23 NOTE — Telephone Encounter (Signed)
Called and spoke with pt letting her know that we need to get her scheduled for a f/u with  VS. Pt verblaizsed understanding. OV scheduled for pt with VS 4/18 as pt needed afternoon appt.   Routing this to pharmacy team as it seems like [facility] pharmacy called back for clarification on pt's injection.

## 2020-12-23 NOTE — Telephone Encounter (Signed)
Returned call. [facility] pharmacist states patient request prefilled syringe for self-injection. She will update rx.   Routing to [facility] as an FYI to update rx in chart.

## 2020-12-23 NOTE — Telephone Encounter (Signed)
Patient's nurse will be administering Dupixent at home, and patient prefers syringe. Advised that pen would allow her to self-inject in case nurse is not available to do so. She states she'd still like the PFS. Rx re-sent to [facility] for PFS  Nothing further needed  Knox Saliva, PharmD, MPH Clinical Pharmacist (Rheumatology and Pulmonology)

## 2020-12-25 ENCOUNTER — Other Ambulatory Visit: Payer: Self-pay | Admitting: Family Medicine

## 2020-12-25 ENCOUNTER — Other Ambulatory Visit: Payer: Self-pay

## 2020-12-25 ENCOUNTER — Ambulatory Visit (INDEPENDENT_AMBULATORY_CARE_PROVIDER_SITE_OTHER): Payer: Medicare Other | Admitting: Family Medicine

## 2020-12-25 ENCOUNTER — Encounter: Payer: Self-pay | Admitting: Family Medicine

## 2020-12-25 VITALS — BP 117/80 | HR 76 | Temp 98.4°F | Resp 18 | Ht 65.0 in | Wt 213.4 lb

## 2020-12-25 DIAGNOSIS — F4321 Adjustment disorder with depressed mood: Secondary | ICD-10-CM | POA: Diagnosis not present

## 2020-12-25 DIAGNOSIS — L299 Pruritus, unspecified: Secondary | ICD-10-CM

## 2020-12-25 DIAGNOSIS — B0223 Postherpetic polyneuropathy: Secondary | ICD-10-CM

## 2020-12-25 MED ORDER — TRIAMCINOLONE ACETONIDE 0.1 % EX OINT: 1.0000 "application " | TOPICAL_OINTMENT | Freq: Two times a day (BID) | CUTANEOUS | 1 refills | Status: DC

## 2020-12-25 MED ORDER — GABAPENTIN 300 MG PO CAPS: 300.0000 mg | ORAL_CAPSULE | Freq: Three times a day (TID) | ORAL | 3 refills | Status: DC

## 2020-12-25 NOTE — Progress Notes (Signed)
   Subjective:    Patient ID: Jenny [name], female    DOB: [birth year], 80 y.o.   MRN: [id number]  HPI Rash- pt had a video visit on 2/23 and was dx'd w/ shingles of L hand/forearm.  Pt reports she will still have intermittent breakouts and when one 'pops up it hurts so bad'.  Pt describes a 'lightning pain' if she bumps L hand or wrist.  No active blisters or rashes at this time but visible healed lesions.  Grief- pt's grandson OD'd at her home after years of depression.  Pt has understandably had a very hard time with this.  She is trying to clean out his room and find some sort of closure but tox report won't be available for 6-8 months.  She has a hx of hives and reports that when she is really stressed she will develop intense itching   Review of Systems For ROS see HPI   This visit occurred during the SARS-CoV-2 public health emergency.  Safety protocols were in place, including screening questions prior to the visit, additional usage of staff PPE, and extensive cleaning of exam room while observing appropriate contact time as indicated for disinfecting solutions.       Objective:   Physical Exam Vitals reviewed.  Constitutional:      General: She is not in acute distress.    Appearance: Normal appearance. She is obese. She is not ill-appearing.  HENT:     Head: Normocephalic and atraumatic.  Eyes:     Extraocular Movements: Extraocular movements intact.     Conjunctiva/sclera: Conjunctivae normal.     Pupils: Pupils are equal, round, and reactive to light.  Skin:    General: Skin is warm and dry.     Findings: No erythema or rash (scattered nearly healed lesions on L hand/wrist).     Comments: Skin is very dry but only bothers her on L arm  Neurological:     General: No focal deficit present.     Mental Status: She is alert and oriented to person, place, and time.  Psychiatric:        Mood and Affect: Mood normal.        Behavior: Behavior normal.        Thought  Content: Thought content normal.           Assessment & Plan:  Post-herpetic neuropathy- new.  Pt continues to have neuropathic pain from her recent shingles outbreak.  Will increase her gabapentin as tolerated.  Itching- new.  Suspect this is also nerve mediated as there are no rashes or hives.  Could also be stress related given the recent overdose of her grandson.  Start topical Triamcinolone as needed.  Pt expressed understanding and is in agreement w/ plan.   Grief- pt's grandson recently overdosed in their home (he lived w/ her since his mother- pt's daughter- was killed when he was 41).  She is having a hard time being in the house alone.  Would like closure but the tox report will take 6-8 months.  Encouraged her to seek grief counseling or a support group to work through this.  Will follow.

## 2020-12-25 NOTE — Patient Instructions (Signed)
Schedule your complete physical in 3 months INCREASE the Gabapentin to 300mg  at night.  If that helps w/ the pain, you can increase to 300mg  3x/day.  If the 300mg  makes you too sleepy, you can still use the 100mg  during the day USE the Triamcinolone ointment for the itchy areas Consider grief counseling to help you work through this.  It's a lot to handle! Call with any questions or concerns Hang in there!!!

## 2020-12-26 ENCOUNTER — Other Ambulatory Visit: Payer: Self-pay | Admitting: Family Medicine

## 2020-12-26 NOTE — Telephone Encounter (Signed)
Requesting:Xanax 0.5mg  Contract: UDS: Last Visit:12/25/20 Next Visit:04/09/21 Last Refill:11/19/20 90 tabs 0 refills  Please Advise

## 2020-12-31 ENCOUNTER — Telehealth: Payer: Self-pay | Admitting: Pulmonary Disease

## 2021-01-01 NOTE — Telephone Encounter (Signed)
Returned call. Patient was able to schedule refill yesterday. Nothing further is needed.  [facility]# [phone]

## 2021-01-27 ENCOUNTER — Other Ambulatory Visit: Payer: Self-pay

## 2021-01-27 ENCOUNTER — Encounter: Payer: Self-pay | Admitting: Pulmonary Disease

## 2021-01-27 ENCOUNTER — Ambulatory Visit: Payer: Medicare Other | Admitting: Pulmonary Disease

## 2021-01-27 VITALS — BP 128/72 | HR 73 | Temp 97.6°F | Ht 65.0 in | Wt 212.4 lb

## 2021-01-27 DIAGNOSIS — J8489 Other specified interstitial pulmonary diseases: Secondary | ICD-10-CM

## 2021-01-27 DIAGNOSIS — J849 Interstitial pulmonary disease, unspecified: Secondary | ICD-10-CM

## 2021-01-27 DIAGNOSIS — R911 Solitary pulmonary nodule: Secondary | ICD-10-CM

## 2021-01-27 DIAGNOSIS — J8283 Eosinophilic asthma: Secondary | ICD-10-CM | POA: Diagnosis not present

## 2021-01-27 DIAGNOSIS — G4733 Obstructive sleep apnea (adult) (pediatric): Secondary | ICD-10-CM | POA: Diagnosis not present

## 2021-01-27 DIAGNOSIS — J4550 Severe persistent asthma, uncomplicated: Secondary | ICD-10-CM | POA: Diagnosis not present

## 2021-01-27 NOTE — Patient Instructions (Signed)
Will schedule high resolution CT chest and call with results  Follow up in 6 months

## 2021-01-27 NOTE — Progress Notes (Signed)
[facility]  Chief Complaint  Patient presents with  . Follow-up    Breathing well at this time.    Constitutional:  BP 128/72 (BP Location: Left Arm, Cuff Size: Normal)   Pulse 73   Temp 97.6 F (36.4 C) (Oral)   Ht 5\' 5"  (1.651 m)   Wt 212 lb 6.4 oz (96.3 kg)   SpO2 98%   BMI 35.35 kg/m   Past Medical History:  Vit D deficiency, Neuropathy, Low back pain, Hypothyroidism, HTN, GERD, Diverticulosis, Depression, Anxiety, DJD, COVID 19 pneumonia December 2021, Shingles  Past Surgical History:  Her  has a past surgical history that includes Appendectomy; Tonsillectomy and adenoidectomy; AP repair and sling for cystocele (8-04 ); left hand (2010); ctr; Shoulder arthroscopy distal clavicle excision and open rotator cuff repair; Neck surgery; Abdominal hysterectomy; Cholecystectomy; carpal tunel (2008); Tubal ligation; and Eye surgery.  Brief Summary:  Jenny [name] is a 80 y.o. female with former smoker eosinophilic asthma, allergic rhinitis, mild ILD, lung nodule and OSA, and COPD.      Subjective:    Since I last saw her she was in hospital with COVID 19 pneumonia.  Treated with remdesivir and dexamethasone.  Didn't need oxygen at home.  Still feels fatigued.  Not having cough, wheeze or sputum.  Denies chest pain.  Was diagnosed with shingles in her left arm in ulnar nerve distribution.  Still has neuropathic pain.  Her grandson passed away from an overdose recently.  She is still dealing with the emotions from this loss.  Physical Exam:   Appearance - well kempt   ENMT - no sinus tenderness, no oral exudate, no LAN, Mallampati 3 airway, no stridor  Respiratory - equal breath sounds bilaterally, no wheezing or rales  CV - s1s2 regular rate and rhythm, no murmurs  Ext - no clubbing, no edema  Skin - no rashes  Psych - normal mood and affect   Pulmonary testing:   Spirometry 04/21/18 >> FEV1 1.3 (64%), FEV1%  83  IgE 01/30/20 >> 40, Absolute eosinophils 600  PFT 04/17/20 >> FEV1 1.30 (65%), FEV1% 79, TLC 6.97 (133%), RV 5.11 (207%), DLCO 88%  Serology 04/17/20 >> ANA, HP panel, anti CCP negative; RF 25  Chest Imaging:   HRCT chest 02/09/20 >> atherosclerosis, 3 mm nodule LLL, mild patchy subpleural reticulation and GGO b/l with mild basilar predominance  Sleep Tests:   PSG April 2008 >> AHI 29, SpO2 low 62%  Auto CPAP 11/07/19 to 12/05/18 >> used on 22 of 30 nights with average 8 hrs 40 min.  Average AHI 1.2 with median CPAP 7 and 95 th percentile CPAP 11 cm H2O.  Cardiac Tests:   Echo 03/16/17 >> EF 60 to 65%  Social History:  She  reports that she quit smoking about 29 years ago. Her smoking use included cigarettes. She has a 45.00 pack-year smoking history. She has never used smokeless tobacco. She reports that she does not drink alcohol and does not use drugs.  Family History:  Her family history includes Anxiety disorder in her daughter; Arthritis in her brother and mother; Asthma in her daughter; COPD in her mother; Cancer in her maternal aunt, maternal aunt, and paternal grandmother; Colon polyps in her daughter; Dementia in her father; Heart disease in her maternal grandfather, maternal grandmother, and mother; Hypertension in her mother; Kidney disease in her paternal grandfather; Other in her maternal grandfather, mother, and paternal grandfather; Pneumonia in her mother; Stroke  in her maternal grandmother.     Assessment/Plan:   Eosinophilic asthma. - singulair caused nightmares - intolerant of prednisone, but can use medrol - much improved since she started dupixent in August 2021 - she can use prn symbicort  Allergic rhinitis with post nasal drip. - prn flonase and mucinex  ILD. - HRCT chest from 02/09/20 showed mild pattern suggestive of NSIP - PFT from July 2021 didn't show restriction or diffusion defect - serology from July 2021 was negative - she had COVID 19  pneumonia in December 2021 - will arrange for high resolution CT chest and call with results  Lt lower lobe 3 mm lung nodule. - she is to be scheduled for follow up CT chest  Obstructive sleep apnea. - she is compliant with CPAP and reports benefit from therapy - uses [facility] for her DME - continue auto CPAP - will get copy of last 30 days dowload  Time Spent Involved in Patient Care on Day of Examination:  22 minutes  Follow up:  Patient Instructions  Will schedule high resolution CT chest and call with results  Follow up in 6 months   Medication List:   Allergies as of 01/27/2021      Reactions   Prednisone Other (See Comments)   Confusion, dizziness Medrol is ok   Codeine Nausea Only       Tramadol Itching      Medication List       Accurate as of January 27, 2021  3:39 PM. If you have any questions, ask your nurse or doctor.        albuterol 108 (90 Base) MCG/ACT inhaler Commonly known as: VENTOLIN HFA Inhale 2 puffs into the lungs every 6 (six) hours as needed. For wheezing   ALPRAZolam 0.5 MG tablet Commonly known as: XANAX TAKE 1/2 TO 1 TABLET BY MOUTH 3 TIMES DAILY AS NEEDED FOR ANXIETY   amLODipine 5 MG tablet Commonly known as: NORVASC TAKE 1 TABLET BY MOUTH  DAILY   benzonatate 200 MG capsule Commonly known as: TESSALON Take 1 capsule (200 mg total) by mouth 3 (three) times daily as needed for cough.   budesonide-formoterol 160-4.5 MCG/ACT inhaler Commonly known as: SYMBICORT Inhale 2 puffs into the lungs 2 (two) times daily.   cetirizine 10 MG tablet Commonly known as: ZYRTEC Take 10 mg by mouth daily as needed.   Dupixent 300 MG/2ML prefilled syringe Generic drug: dupilumab Inject 300 mg into the skin every 14 (fourteen) days. Patient wants PFS   fluticasone 50 MCG/ACT nasal spray Commonly known as: FLONASE PLACE 1 SPRAY INTO BOTH NOSTRILS DAILY.   gabapentin 100 MG capsule Commonly known as: NEURONTIN TAKE 1 CAPSULE (100 MG TOTAL)  BY MOUTH THREE TIMES DAILY.   gabapentin 300 MG capsule Commonly known as: NEURONTIN Take 1 capsule (300 mg total) by mouth 3 (three) times daily.   levothyroxine 112 MCG tablet Commonly known as: SYNTHROID TAKE 1 TABLET BY MOUTH EVERY DAY   pantoprazole 40 MG tablet Commonly known as: PROTONIX TAKE 1 TABLET BY MOUTH EVERY DAY 30 MINUTES BEFORE DINNER   triamcinolone ointment 0.1 % Commonly known as: KENALOG Apply 1 application topically 2 (two) times daily.   valACYclovir 1000 MG tablet Commonly known as: VALTREX Take 1 tablet (1,000 mg total) by mouth 3 (three) times daily.   Vitamin D3 25 MCG tablet Commonly known as: Vitamin D Take 1 tablet (1,000 Units total) by mouth daily.       Signature:  Ansen Sayegh  Halford Chessman, MD [place] [facility] - [phone] 01/27/2021, 3:39 PM

## 2021-02-04 ENCOUNTER — Other Ambulatory Visit: Payer: Self-pay

## 2021-02-04 ENCOUNTER — Ambulatory Visit (INDEPENDENT_AMBULATORY_CARE_PROVIDER_SITE_OTHER)
Admission: RE | Admit: 2021-02-04 | Discharge: 2021-02-04 | Disposition: A | Discharge status: Home / Self Care | Payer: Medicare Other | Source: Ambulatory Visit | Attending: Pulmonary Disease | Admitting: Pulmonary Disease

## 2021-02-04 DIAGNOSIS — J849 Interstitial pulmonary disease, unspecified: Secondary | ICD-10-CM

## 2021-02-04 DIAGNOSIS — R0602 Shortness of breath: Secondary | ICD-10-CM | POA: Diagnosis not present

## 2021-02-07 ENCOUNTER — Other Ambulatory Visit: Payer: Self-pay | Admitting: Family Medicine

## 2021-02-07 NOTE — Telephone Encounter (Signed)
Requesting:Xanax 0.5mg  Contract: UDS: Last Visit:12/25/20 Next Visit:04/09/21 Last Refill:12/26/20 90 tabs 0 refills  Please Advise

## 2021-02-17 ENCOUNTER — Telehealth: Payer: Self-pay

## 2021-02-17 ENCOUNTER — Ambulatory Visit: Payer: Medicare Other | Admitting: Orthopedic Surgery

## 2021-02-17 ENCOUNTER — Encounter: Payer: Self-pay | Admitting: Orthopedic Surgery

## 2021-02-17 DIAGNOSIS — M545 Low back pain, unspecified: Secondary | ICD-10-CM | POA: Diagnosis not present

## 2021-02-17 DIAGNOSIS — M1712 Unilateral primary osteoarthritis, left knee: Secondary | ICD-10-CM

## 2021-02-17 NOTE — Telephone Encounter (Signed)
Can we get auth for left knee gel injection?

## 2021-02-17 NOTE — Telephone Encounter (Signed)
Noted  

## 2021-02-19 ENCOUNTER — Encounter: Payer: Self-pay | Admitting: Orthopedic Surgery

## 2021-02-19 NOTE — Progress Notes (Signed)
Office Visit Note   Patient: Jenny [name]           Date of Birth: 1941-08-18           MRN: [id number] Visit Date: 02/17/2021 Requested by: Midge Minium, MD [address] [place],  [place] [address] PCP: Midge Minium, MD  Subjective: Chief Complaint  Patient presents with  . Lower Back - Pain  . Left Knee - Pain    HPI: Jenny [name] is a 80 y.o. female who presents to the office complaining of low back pain and left knee pain.  Patient has similar complaints to last office visit in December 2021.  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  She had left knee injection back in December that provided some relief but it has now worn off.  She did have COVID several weeks after her last office visit and her grandson passed away in early 02/11/21 which has been very hard for her.  She has had prior epidural steroid injections for her low back pain about 3 to 4 years ago that provided excellent relief.  Dr. Sherwood Gambler did the injections at that time and she states "I want the injections where he did them".  She is currently taking ibuprofen and gabapentin without lasting relief of her pain.  She is planning on going on a vacation in June.  Regarding her left knee pain, she has medial pain of the left knee that makes it feel like it wants to give out.  Denies any mechanical symptoms.  Denies any groin pain or significant radiation of her knee pain.  She has difficulty navigating stairs due to her knee pain..                ROS: All systems reviewed are negative as they relate to the chief complaint within the history of present illness.  Patient denies fevers or chills.  Assessment & Plan: Visit Diagnoses:  1. Low back pain, unspecified back pain laterality, unspecified chronicity, unspecified whether sciatica present     Plan: Patient is a 80 year old female who presents complaining of low back pain and left knee pain.  She has history of  left knee arthritis that has responded well to cortisone injections.  She would like to try another one today.  This was successfully administered and patient tolerated the procedure well.  She also complains of continued low back pain.  We discussed option for ESI at her last appointment but she wanted to hold off.  She now says that this pain is bad enough that she would like to try another epidural steroid injection.  Last injection was about 3 to 4 years ago by Dr. Sherwood Gambler that provided excellent relief.  She would like to try another injection similar to this.  She was last seen for lumbar spine by Dr. Sherwood Gambler in March 2018 after seeing Dr. 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  She had complained of left knee pain at that time though the left knee pain was more radicular in nature than it is today.  Plan to refer patient to [facility] for L-spine ESI.  Preapproved patient for gel injections for the left knee.  Follow-up after cortisone injection wears off.  This patient is diagnosed with osteoarthritis of the knee(s).  Radiographs show evidence of joint space narrowing, osteophytes, subchondral sclerosis and/or subchondral cysts.  This patient has knee pain which interferes with functional and activities of daily living.    This patient has experienced inadequate response, adverse effects and/or intolerance with conservative treatments such as acetaminophen, NSAIDS, topical creams, physical therapy or regular exercise, knee bracing and/or weight loss.   This patient has experienced inadequate response or has a contraindication to intra articular steroid injections for at least 3 months.   This patient is not scheduled to have a total knee replacement within 6 months of starting treatment with  viscosupplementation.   Follow-Up Instructions: No follow-ups on file.   Orders:  Orders Placed This Encounter  Procedures  . Epidural Steroid Injection - Lumbar/Sacral (Ancillary Performed)   No orders of the defined types were placed in this encounter.     Procedures: Large Joint Inj: L knee on 02/17/2021 12:09 PM Indications: diagnostic evaluation, joint swelling and pain Details: 18 G 1.5 in needle, superolateral approach  Arthrogram: No  Medications: 5 mL lidocaine 1 %; 4 mL bupivacaine 0.25 %; 6 mg betamethasone acetate-betamethasone sodium phosphate 6 (3-3) MG/ML Outcome: tolerated well, no immediate complications Procedure, treatment alternatives, risks and benefits explained, specific risks discussed. Consent was given by the patient. Immediately prior to procedure a time out was called to verify the correct patient, procedure, equipment, support staff and site/side marked as required. Patient was prepped and draped in the usual sterile fashion.       Clinical Data: No additional findings.  Objective: Vital Signs: There were no vitals taken for this visit.  Physical Exam:  Constitutional: Patient appears well-developed HEENT:  Head: Normocephalic Eyes:EOM are normal Neck: Normal range of motion Cardiovascular: Normal rate Pulmonary/chest: Effort normal Neurologic: Patient is alert Skin: Skin is warm Psychiatric: Patient has normal mood and affect  Ortho Exam: Ortho exam demonstrates left knee with primarily medial joint line tenderness.  Mild lateral joint line tenderness.  No significant effusion.  Patient able to perform straight leg raise without extensor lag.  No pain with hip range of motion but she does have some decreased internal rotation of the left hip slightly compared with the right hip.  No pain with Stinchfield exam.  No nerve root tension signs.  +5 motor length of bilateral hip flexor, quadricep, hamstring, dorsiflexion,  plantarflexion.  Specialty Comments:  No specialty comments available.  Imaging: No results found.   PMFS History: Patient Active Problem List   Diagnosis Date Noted  . COVID-19 virus infection 09/30/2020  . NSIP (nonspecific interstitial pneumonitis) ([place]) 09/30/2020  . 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 Abnormal CT of the chest 04/18/2020  . Colon cancer screening 06/15/2016  . 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 Hyperglycemia 07/07/2015  . Annual physical exam 08/06/2013  . Atrophic vaginitis 06/17/2012  . Peripheral vascular disease, unspecified ([place]) 06/02/2012  . Urticaria 10/15/2011  . Anemia 09/21/2011  . 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  . Vitamin D deficiency 02/12/2011  . Peripheral neuropathy 02/12/2011  . Intertrigo 01/21/2011  . 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 History of chicken pox 01/14/2011  . History of measles 01/14/2011  . OTHER URINARY INCONTINENCE 03/12/2010  . Depression with anxiety 03/06/2008  . Essential hypertension 02/22/2008  . Chronic bronchitis ([place]) 02/22/2008  . DIVERTICULOSIS OF COLON 02/22/2008  . Unilateral primary osteoarthritis, left knee 02/22/2008  . Hypothyroidism 08/23/2007  . Obesity 08/23/2007  . OSA on CPAP 08/23/2007  .  Venous (peripheral) insufficiency 08/23/2007  . Allergic rhinitis 08/23/2007  . GERD without esophagitis 08/23/2007  . BACK PAIN, LUMBAR 08/23/2007   Past Medical History:  Diagnosis Date  . ALLERGIC RHINITIS 08/23/2007  . Allergy    rhinitis  . 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 Anemia 09/21/2011  . Annual physical exam 08/06/2013  . Asthma    mild, intermittent  . Asthma 01/14/2011  . Atrophic vaginitis 06/17/2012  . BACK PAIN, LUMBAR 08/23/2007  . Bronchitis   . BRONCHITIS, RECURRENT 02/22/2008  . Cough    usually not productive  . Degenerative joint disease   . DEGENERATIVE JOINT DISEASE 02/22/2008  . Depression with  anxiety 03/06/2008   Qualifier: Diagnosis of  By: Lenna Gilford MD, Deborra Medina   . Diverticulosis of colon   . DIVERTICULOSIS OF COLON 02/22/2008  . Dysthymia   . 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  . elevated glucose 08/21/2008  . GERD 08/23/2007  . GERD (gastroesophageal reflux disease)   . HAND PAIN 04/09/2010  . History of chicken pox 01/14/2011  . History of measles 01/14/2011  . Hyperglycemia 07/07/2015  . Hypertension   . HYPERTENSION 02/22/2008  . Hypothyroidism   . HYPOTHYROIDISM 08/23/2007  . Incontinence of urine   . Lumbar back pain   . Obesity   . OBESITY 08/23/2007  . Obstructive sleep apnea    does not use CPAP, lost weight  . Other urinary incontinence 03/12/2010  . Peripheral neuropathy 02/12/2011   possibly related to spondylolisthesis per pt  . PONV (postoperative nausea and vomiting)   . Sleep apnea   . SLEEP APNEA, OBSTRUCTIVE 08/23/2007  . Transient memory loss March 2006   after a stressful event she had no memory the rest of that day  . Urinary frequency   . Urinary incontinence   . UTI (lower urinary tract infection) 03/23/2012  . Venous insufficiency   . VENOUS INSUFFICIENCY 08/23/2007  . Vitamin D deficiency   . VITAMIN D DEFICIENCY 08/21/2008    Family History  Problem Relation Age of Onset  . COPD Mother   . Pneumonia Mother   . Other Mother        CHF  . Hypertension Mother   . Arthritis Mother        septic knee after replacement  . Heart disease Mother        chf  . Dementia Father   . Arthritis Brother   . Asthma Daughter   . Colon polyps Daughter   . Stroke Maternal Grandmother   . Heart disease Maternal Grandmother   . Other Maternal Grandfather        CHF  . Heart disease Maternal Grandfather        chf  . Cancer Paternal Grandmother        stomach?  . Other Paternal Grandfather        problems with kidneys  . Kidney disease Paternal Grandfather   . Anxiety disorder Daughter   . Cancer Maternal Aunt        breast  . Cancer Maternal Aunt        liver   . Colon cancer Neg Hx     Past Surgical History:  Procedure Laterality Date  . ABDOMINAL HYSTERECTOMY     took one ovary  . AP repair and sling for cystocele  8-04    Dr Gaynelle Arabian  . APPENDECTOMY    . carpal tunel  2008  . CHOLECYSTECTOMY    . ctr     bilateral  .  EYE SURGERY    . left hand  2010   left thumb, joint removal, CTR  . NECK SURGERY     x 2 - discectomy, fusion  . SHOULDER ARTHROSCOPY DISTAL CLAVICLE EXCISION AND OPEN ROTATOR CUFF REPAIR     right  . TONSILLECTOMY AND ADENOIDECTOMY    . TUBAL LIGATION     Social History   Occupational History  . Occupation: Retired  Tobacco Use  . Smoking status: Former Smoker    Packs/day: 1.50    Years: 30.00    Pack years: 45.00    Types: Cigarettes    Quit date: 10/13/1991    Years since quitting: 29.3  . Smokeless tobacco: Never Used  Vaping Use  . Vaping Use: Never used  Substance and Sexual Activity  . Alcohol use: No    Alcohol/week: 0.0 standard drinks  . Drug use: No  . Sexual activity: Never

## 2021-02-21 ENCOUNTER — Telehealth: Payer: Self-pay

## 2021-02-21 NOTE — Chronic Care Management (AMB) (Signed)
Chronic Care Management Pharmacy Assistant   Name: Jenny [name]  MRN: [id number] DOB: June 07, 1941  Reason for Encounter: General Adherence Call  Recent office visits:  12/25/20- Jenny [name]- seen for post herpetic neuropathy, started Kenalog 0.1% bid, increased gabapentin from 1 cap tid to 3 caps tid, schedule physical for  3 months  12/04/20- Jenny Asa, MD (Video Visit)- seen for rash, short course valacyclovir 1000 mg tid x 7days. Telephone Note: started gabapentin 100 mg tid prn for pain    Recent consult visits:  02/17/21- Jenny Duos, MD(Ortho)- seen for low back pain and left knee pain, epidural steroid injection given , follow up as needed 01/27/21- Jenny Mires, MD (Pulmonology)- seen for Interstitial pulmonary disease follow up, CT scheduled, follow up 6 months   Hospital visits:  Medication Reconciliation was completed by comparing discharge summary, patient's EMR and Pharmacy list, and upon discussion with patient.  Same day discharge on 11/27/20 [name]. Seen for arm pain and chest pain.  Started ibuprofen 800 mg   All other medications  remain the same.    Medications: Outpatient Encounter Medications as of 02/21/2021  Medication Sig Note  . albuterol (VENTOLIN HFA) 108 (90 Base) MCG/ACT inhaler Inhale 2 puffs into the lungs every 6 (six) hours as needed. For wheezing   . ALPRAZolam (XANAX) 0.5 MG tablet TAKE 1/2 TO 1 TABLET BY MOUTH 3 TIMES DAILY AS NEEDED FOR ANXIETY   . amLODipine (NORVASC) 5 MG tablet TAKE 1 TABLET BY MOUTH  DAILY   . benzonatate (TESSALON) 200 MG capsule Take 1 capsule (200 mg total) by mouth 3 (three) times daily as needed for cough.   . budesonide-formoterol (SYMBICORT) 160-4.5 MCG/ACT inhaler Inhale 2 puffs into the lungs 2 (two) times daily.   . cetirizine (ZYRTEC) 10 MG tablet Take 10 mg by mouth daily as needed.   . cholecalciferol (VITAMIN D) 25 MCG tablet Take 1 tablet (1,000 Units total) by mouth  daily.   . dupilumab (DUPIXENT) 300 MG/2ML prefilled syringe Inject 300 mg into the skin every 14 (fourteen) days. Patient wants PFS   . fluticasone (FLONASE) 50 MCG/ACT nasal spray PLACE 1 SPRAY INTO BOTH NOSTRILS DAILY.   Marland Kitchen gabapentin (NEURONTIN) 100 MG capsule TAKE 1 CAPSULE (100 MG TOTAL) BY MOUTH THREE TIMES DAILY.   Marland Kitchen gabapentin (NEURONTIN) 300 MG capsule Take 1 capsule (300 mg total) by mouth 3 (three) times daily.   Marland Kitchen levothyroxine (SYNTHROID) 112 MCG tablet TAKE 1 TABLET BY MOUTH EVERY DAY   . pantoprazole (PROTONIX) 40 MG tablet TAKE 1 TABLET BY MOUTH EVERY DAY 30 MINUTES BEFORE DINNER   . triamcinolone ointment (KENALOG) 0.1 % Apply 1 application topically 2 (two) times daily.   . valACYclovir (VALTREX) 1000 MG tablet Take 1 tablet (1,000 mg total) by mouth 3 (three) times daily.   . [DISCONTINUED] esomeprazole (NEXIUM) 40 MG capsule Take 40 mg by mouth daily before breakfast.   12/18/2011: Dr Marlyce Huge put pt on Zantac   No facility-administered encounter medications on file as of 02/21/2021.    Spoke with Jenny [name] and she is doing well overall. She stated she is no longer taking her short course meds that were given for her Covid pneumonia and her shingles pain.  She stills takes her gabapentin when she needs its but tries to avoid taking it. She stated it makes her staggery which her providers would rather her to avoid. She receives her dupixent injection every two weeks and stated this has  significantly improved her breathing. She will be receiving her steroid injection this month for her back, but has already received one for her knee which has improved her pain in this aspect. Overall her pain is being managed well and when not she uses ibuprofen as an alternative. Besides physical ailments, Jenny [name] has also been dealing with personal things. She lost her grandson on February 9th and has not been herself since. She does, however, have a good support team through her family. They have  been trying to keep her busy and help cope with her feelings. They plan on going to a parade this weekend and also have a trip planned to go to [place] park with her great grand kids and daughters. Jenny [name] also stated they will be going to see Jenny [name] and [name] at sight [place] and will visit [place] which [place]. Ultimately she has no concerns for her health that are unmanaged and has no complaints with receiving her medication from [facility] or [facility].     Star Rating Drugs: No Star Ratings    Jenny [name] CPA, CMA

## 2021-02-22 ENCOUNTER — Encounter: Payer: Self-pay | Admitting: Orthopedic Surgery

## 2021-02-22 MED ORDER — BUPIVACAINE HCL 0.25 % IJ SOLN
4.0000 mL | INTRAMUSCULAR | Status: AC | PRN
  Administered 2021-02-17: 4 mL via INTRA_ARTICULAR

## 2021-02-22 MED ORDER — BETAMETHASONE SOD PHOS & ACET 6 (3-3) MG/ML IJ SUSP
6.0000 mg | INTRAMUSCULAR | Status: AC | PRN
  Administered 2021-02-17: 6 mg via INTRA_ARTICULAR

## 2021-02-22 MED ORDER — LIDOCAINE HCL 1 % IJ SOLN
5.0000 mL | INTRAMUSCULAR | Status: AC | PRN
  Administered 2021-02-17: 5 mL

## 2021-02-28 ENCOUNTER — Ambulatory Visit
Admission: RE | Admit: 2021-02-28 | Discharge: 2021-02-28 | Disposition: A | Discharge status: Home / Self Care | Payer: Medicare Other | Source: Ambulatory Visit | Attending: Surgical | Admitting: Surgical

## 2021-02-28 DIAGNOSIS — M545 Low back pain, unspecified: Secondary | ICD-10-CM

## 2021-02-28 DIAGNOSIS — M47817 Spondylosis without myelopathy or radiculopathy, lumbosacral region: Secondary | ICD-10-CM | POA: Diagnosis not present

## 2021-02-28 MED ORDER — IOPAMIDOL (ISOVUE-M 200) INJECTION 41%
1.0000 mL | Freq: Once | INTRAMUSCULAR | Status: AC
  Administered 2021-02-28: 1 mL via EPIDURAL

## 2021-02-28 MED ORDER — METHYLPREDNISOLONE ACETATE 40 MG/ML INJ SUSP (RADIOLOG
80.0000 mg | Freq: Once | INTRAMUSCULAR | Status: AC
  Administered 2021-02-28: 80 mg via EPIDURAL

## 2021-02-28 NOTE — Discharge Instructions (Signed)
Post Procedure Spinal Discharge Instruction Sheet  1. You may resume a regular diet and any medications that you routinely take (including pain medications).  2. No driving day of procedure.  3. Light activity throughout the rest of the day.  Do not do any strenuous work, exercise, bending or lifting.  The day following the procedure, you can resume normal physical activity but you should refrain from exercising or physical therapy for at least three days thereafter.   Common Side Effects:   Headaches- take your usual medications as directed by your physician.  Increase your fluid intake.  Caffeinated beverages may be helpful.  Lie flat in bed until your headache resolves.   Restlessness or inability to sleep- you may have trouble sleeping for the next few days.  Ask your referring physician if you need any medication for sleep.   Facial flushing or redness- should subside within a few days.   Increased pain- a temporary increase in pain a day or two following your procedure is not unusual.  Take your pain medication as prescribed by your referring physician.   Leg cramps  Please contact our office at [phone] for the following symptoms:  Fever greater than 100 degrees.  Headaches unresolved with medication after 2-3 days.  Increased swelling, pain, or redness at injection site.   Thank you for visiting [facility] today.  

## 2021-03-05 ENCOUNTER — Encounter: Payer: Self-pay | Admitting: Orthopedic Surgery

## 2021-03-12 ENCOUNTER — Ambulatory Visit: Payer: Medicare Other | Admitting: Podiatry

## 2021-03-13 ENCOUNTER — Ambulatory Visit: Payer: Medicare Other | Admitting: Podiatry

## 2021-03-13 ENCOUNTER — Ambulatory Visit (INDEPENDENT_AMBULATORY_CARE_PROVIDER_SITE_OTHER): Payer: Medicare Other

## 2021-03-13 ENCOUNTER — Other Ambulatory Visit: Payer: Self-pay

## 2021-03-13 DIAGNOSIS — S9031XA Contusion of right foot, initial encounter: Secondary | ICD-10-CM | POA: Diagnosis not present

## 2021-03-13 DIAGNOSIS — L603 Nail dystrophy: Secondary | ICD-10-CM

## 2021-03-13 DIAGNOSIS — M7742 Metatarsalgia, left foot: Secondary | ICD-10-CM | POA: Diagnosis not present

## 2021-03-13 DIAGNOSIS — L84 Corns and callosities: Secondary | ICD-10-CM

## 2021-03-13 DIAGNOSIS — M79672 Pain in left foot: Secondary | ICD-10-CM | POA: Diagnosis not present

## 2021-03-16 ENCOUNTER — Other Ambulatory Visit: Payer: Self-pay | Admitting: Family Medicine

## 2021-03-17 ENCOUNTER — Ambulatory Visit: Payer: Medicare Other | Admitting: Podiatry

## 2021-03-17 ENCOUNTER — Ambulatory Visit: Payer: Medicare Other | Admitting: Orthopedic Surgery

## 2021-03-17 DIAGNOSIS — M1712 Unilateral primary osteoarthritis, left knee: Secondary | ICD-10-CM

## 2021-03-19 NOTE — Progress Notes (Signed)
Subjective: 80 year old female presents the office with concerns of her left big toenail feeling thickened discolored.  She states that she had the nail removed by Dr. Milinda Pointer and grew back thickened discolored.  She is scheduled to go on vacation.  This taken care of also she has pain in the ball of her left foot pointing to submetatarsal 1 and 5 all areas of calluses.  Denies any open sores or swelling or redness.  She has no drainage that she reports.  No injury. Denies any systemic complaints such as fevers, chills, nausea, vomiting. No acute changes since last appointment, and no other complaints at this time.   Objective: AAO x3, NAD DP/PT pulses palpable bilaterally, CRT less than 3 seconds Hallux toenails hypertrophic, dystrophic with yellow-brown discoloration.  Plaquing present in the nail.  No pain the nails no redness or drainage or signs of infection.  Hyperkeratotic lesion submetatarsal 1 and 5 on the left foot without any underlying ulceration drainage or signs of infection.  Prominence of metatarsal heads plantarly with atrophy of fat pad.  No pain with calf compression, swelling, warmth, erythema  Assessment: Onychodystrophy, hyperkeratotic lesions, metatarsalgia  Plan: -All treatment options discussed with the patient including all alternatives, risks, complications.  -Through the nail without any complications or bleeding.  Also debrided the calluses without any complications or bleeding.  Recommend moisturizer and offloading daily.  Dispensed metatarsal offloading pads and discussed shoe modifications. -Patient encouraged to call the office with any questions, concerns, change in symptoms.

## 2021-03-20 ENCOUNTER — Encounter: Payer: Self-pay | Admitting: Orthopedic Surgery

## 2021-03-20 DIAGNOSIS — M1712 Unilateral primary osteoarthritis, left knee: Secondary | ICD-10-CM | POA: Diagnosis not present

## 2021-03-20 MED ORDER — LIDOCAINE HCL 1 % IJ SOLN
5.0000 mL | INTRAMUSCULAR | Status: AC | PRN
  Administered 2021-03-20: 5 mL

## 2021-03-20 MED ORDER — HYLAN G-F 20 48 MG/6ML IX SOSY
48.0000 mg | PREFILLED_SYRINGE | INTRA_ARTICULAR | Status: AC | PRN
  Administered 2021-03-20: 48 mg via INTRA_ARTICULAR

## 2021-03-20 NOTE — Progress Notes (Signed)
   Procedure Note  Patient: Jenny [name]             Date of Birth: [birth year]           MRN: [id number]             Visit Date: 03/17/2021  Procedures: Visit Diagnoses:  1. Unilateral primary osteoarthritis, left knee     Large Joint Inj: L knee on 03/20/2021 7:50 AM Indications: diagnostic evaluation, joint swelling and pain Details: 18 G 1.5 in needle, superolateral approach  Arthrogram: No  Medications: 5 mL lidocaine 1 %; 48 mg Hylan 48 MG/6ML Outcome: tolerated well, no immediate complications Procedure, treatment alternatives, risks and benefits explained, specific risks discussed. Consent was given by the patient. Immediately prior to procedure a time out was called to verify the correct patient, procedure, equipment, support staff and site/side marked as required. Patient was prepped and draped in the usual sterile fashion.

## 2021-03-21 ENCOUNTER — Other Ambulatory Visit: Payer: Self-pay | Admitting: Family Medicine

## 2021-03-26 ENCOUNTER — Telehealth: Payer: Self-pay | Admitting: Family Medicine

## 2021-03-26 NOTE — Telephone Encounter (Signed)
Left message for patient to schedule Annual Wellness Visit.  Please schedule with Nurse Health Advisor Leroy Kennedy, RN at [facility].

## 2021-03-27 ENCOUNTER — Telehealth: Payer: Self-pay | Admitting: Family Medicine

## 2021-03-27 ENCOUNTER — Other Ambulatory Visit: Payer: Self-pay | Admitting: Family Medicine

## 2021-03-27 NOTE — Telephone Encounter (Signed)
Patient states that she is completely out of this medication.

## 2021-03-27 NOTE — Telephone Encounter (Signed)
Lexapro was Dc'd. Please advise

## 2021-03-28 NOTE — Telephone Encounter (Signed)
Last refill: 02/07/21 #90, 0 Last OV: 12/25/20 dx. Post-herpetic polyneuropathy

## 2021-03-28 NOTE — Telephone Encounter (Signed)
Ok to refill the Lexapro 5mg  daily, #90, no refills, but please verify with pt that she is actually taking this b/c she told us in March that she wasn't

## 2021-03-28 NOTE — Telephone Encounter (Signed)
FYI

## 2021-03-28 NOTE — Telephone Encounter (Signed)
Pt states that she will just wait and talk to Tabori at her upcoming appt.

## 2021-04-02 ENCOUNTER — Other Ambulatory Visit: Payer: Self-pay

## 2021-04-02 MED ORDER — ESCITALOPRAM OXALATE 5 MG PO TABS: 5.0000 mg | ORAL_TABLET | Freq: Every day | ORAL | 0 refills | Status: DC

## 2021-04-02 NOTE — Telephone Encounter (Signed)
Called patient. She states she stopped for a short period of time and started back. Medication sent to pharmacy.

## 2021-04-02 NOTE — Telephone Encounter (Signed)
Patient called this morning and would like to have this rx called in today.  She states that she is not going to be able wait until her appointment.

## 2021-04-04 ENCOUNTER — Other Ambulatory Visit: Payer: Self-pay | Admitting: Pharmacist

## 2021-04-04 DIAGNOSIS — J8283 Eosinophilic asthma: Secondary | ICD-10-CM

## 2021-04-04 DIAGNOSIS — J4550 Severe persistent asthma, uncomplicated: Secondary | ICD-10-CM

## 2021-04-04 MED ORDER — DUPIXENT 300 MG/2ML ~~LOC~~ SOSY: 300.0000 mg | PREFILLED_SYRINGE | SUBCUTANEOUS | 1 refills | Status: DC

## 2021-04-04 NOTE — Telephone Encounter (Signed)
Received fax from [place] for Dupixent PFS refill.  Dose: 300mg  SQ every 14 days  Her nurse administers the medication  Last office visit was 01/27/21 - patient has seen clinical improvement since starting Dupixent in Aug 2021. She will continue Dupixent as prescribed  Next office visit not yet scheduled but should be in October 2022.  Knox Saliva, PharmD, MPH Clinical Pharmacist (Rheumatology and Pulmonology)

## 2021-04-09 ENCOUNTER — Ambulatory Visit (INDEPENDENT_AMBULATORY_CARE_PROVIDER_SITE_OTHER): Payer: Medicare Other | Admitting: Family Medicine

## 2021-04-09 ENCOUNTER — Encounter: Payer: Self-pay | Admitting: *Deleted

## 2021-04-09 ENCOUNTER — Encounter: Payer: Self-pay | Admitting: Family Medicine

## 2021-04-09 ENCOUNTER — Other Ambulatory Visit: Payer: Self-pay

## 2021-04-09 VITALS — BP 121/80 | HR 71 | Temp 98.9°F | Resp 18 | Ht 64.0 in | Wt 215.2 lb

## 2021-04-09 DIAGNOSIS — Z0000 Encounter for general adult medical examination without abnormal findings: Secondary | ICD-10-CM

## 2021-04-09 DIAGNOSIS — E559 Vitamin D deficiency, unspecified: Secondary | ICD-10-CM

## 2021-04-09 LAB — HEPATIC FUNCTION PANEL
ALT: 9 U/L (ref 0–35)
AST: 14 U/L (ref 0–37)
Albumin: 4 g/dL (ref 3.5–5.2)
Alkaline Phosphatase: 83 U/L (ref 39–117)
Bilirubin, Direct: 0.2 mg/dL (ref 0.0–0.3)
Total Bilirubin: 0.7 mg/dL (ref 0.2–1.2)
Total Protein: 6.4 g/dL (ref 6.0–8.3)

## 2021-04-09 LAB — CBC WITH DIFFERENTIAL/PLATELET
Basophils Absolute: 0.1 10*3/uL (ref 0.0–0.1)
Basophils Relative: 0.8 % (ref 0.0–3.0)
Eosinophils Absolute: 0.3 10*3/uL (ref 0.0–0.7)
Eosinophils Relative: 3.5 % (ref 0.0–5.0)
HCT: 40.3 % (ref 36.0–46.0)
Hemoglobin: 13.4 g/dL (ref 12.0–15.0)
Lymphocytes Relative: 23.5 % (ref 12.0–46.0)
Lymphs Abs: 2.3 10*3/uL (ref 0.7–4.0)
MCHC: 33.3 g/dL (ref 30.0–36.0)
MCV: 90.5 fl (ref 78.0–100.0)
Monocytes Absolute: 0.7 10*3/uL (ref 0.1–1.0)
Monocytes Relative: 7.3 % (ref 3.0–12.0)
Neutro Abs: 6.3 10*3/uL (ref 1.4–7.7)
Neutrophils Relative %: 64.9 % (ref 43.0–77.0)
Platelets: 270 10*3/uL (ref 150.0–400.0)
RBC: 4.45 Mil/uL (ref 3.87–5.11)
RDW: 13.4 % (ref 11.5–15.5)
WBC: 9.8 10*3/uL (ref 4.0–10.5)

## 2021-04-09 LAB — BASIC METABOLIC PANEL
BUN: 23 mg/dL (ref 6–23)
CO2: 28 mEq/L (ref 19–32)
Calcium: 9.2 mg/dL (ref 8.4–10.5)
Chloride: 102 mEq/L (ref 96–112)
Creatinine, Ser: 0.91 mg/dL (ref 0.40–1.20)
GFR: 58.8 mL/min — ABNORMAL LOW (ref >60.00)
Glucose, Bld: 83 mg/dL (ref 70–99)
Potassium: 4.4 mEq/L (ref 3.5–5.1)
Sodium: 138 mEq/L (ref 135–145)

## 2021-04-09 LAB — LIPID PANEL
Cholesterol: 131 mg/dL (ref 0–200)
HDL: 37.9 mg/dL — ABNORMAL LOW (ref >39.00)
LDL Cholesterol: 68 mg/dL (ref 0–99)
NonHDL: 92.97
Total CHOL/HDL Ratio: 3
Triglycerides: 127 mg/dL (ref 0.0–149.0)
VLDL: 25.4 mg/dL (ref 0.0–40.0)

## 2021-04-09 LAB — TSH: TSH: 1.6 u[IU]/mL (ref 0.35–4.50)

## 2021-04-09 LAB — VITAMIN D 25 HYDROXY (VIT D DEFICIENCY, FRACTURES): VITD: 24.64 ng/mL — ABNORMAL LOW (ref 30.00–100.00)

## 2021-04-09 NOTE — Progress Notes (Signed)
Subjective:    Patient ID: Jenny [name], female    DOB: 1941/09/22, 80 y.o.   MRN: [id number]  HPI CPE- UTD on colonoscopy, PNA, COVID.  Due for mammo- 'i just don't care to go get it'  Reviewed past medical, surgical, family and social histories.   Patient Care Team    Relationship Specialty Notifications Start End  Midge Minium, MD PCP - General Family Medicine  11/09/16   Noralee Space, MD Consulting Physician Pulmonary Disease  11/09/16   Ladene Artist, MD Consulting Physician Gastroenterology  11/09/16   Garrel Ridgel, [place] Consulting Physician Podiatry  06/03/17   Dian Queen, MD Consulting Physician Obstetrics and Gynecology  06/03/17   [name], [facility] Pharmacist Pharmacist  01/09/20    Comment: phone number [phone]    Health Maintenance  Topic Date Due   Zoster Vaccines- Shingrix (2 of 2) 05/11/2014   COVID-19 Vaccine (3 - Booster for Moderna series) 04/25/2021 (Originally 05/25/2020)   INFLUENZA VACCINE  05/12/2021   TETANUS/TDAP  09/21/2021   DEXA SCAN  Completed   PNA vac Low Risk Adult  Completed   HPV VACCINES  Aged Out      Review of Systems Patient reports no hearing changes, adenopathy,fever, weight change,  persistant/recurrent hoarseness , swallowing issues, chest pain, palpitations, edema, persistant/recurrent cough, hemoptysis, dyspnea (rest/exertional/paroxysmal nocturnal), gastrointestinal bleeding (melena, rectal bleeding), abdominal pain, significant heartburn, bowel changes, Gyn symptoms (abnormal  bleeding, pain),  syncope, focal weakness, memory loss, skin/hair/nail changes, abnormal bruising or bleeding.   + visual changes- since having COVID and starting Dupixent + anxiety/depression- since grandson OD'd in Feb + nocturia- pt denies frequency during the day, only occurs at night + numbness of hands/feet  This visit occurred during the SARS-CoV-2 public health emergency.  Safety protocols were in place, including screening  questions prior to the visit, additional usage of staff PPE, and extensive cleaning of exam room while observing appropriate contact time as indicated for disinfecting solutions.      Objective:   Physical Exam General Appearance:    Alert, cooperative, no distress, appears stated age, obese  Head:    Normocephalic, without obvious abnormality, atraumatic  Eyes:    PERRL, conjunctiva/corneas clear, EOM's intact, fundi    benign, both eyes  Ears:    Normal TM's and external ear canals, both ears  Nose:   Deferred due to COVID  Throat:   Neck:   Supple, symmetrical, trachea midline, no adenopathy;    Thyroid: no enlargement/tenderness/nodules  Back:     Symmetric, no curvature, ROM normal, no CVA tenderness  Lungs:     Clear to auscultation bilaterally, respirations unlabored  Chest Wall:    No tenderness or deformity   Heart:    Regular rate and rhythm, S1 and S2 normal, no murmur, rub   or gallop  Breast Exam:    Deferred to GYN  Abdomen:     Soft, non-tender, bowel sounds active all four quadrants,    no masses, no organomegaly  Genitalia:    Deferred to GYN  Rectal:    Extremities:   Extremities normal, atraumatic, trace edema bilaterally  Pulses:   2+ and symmetric all extremities  Skin:   Skin color, texture, turgor normal, no rashes or lesions  Lymph nodes:   Cervical, supraclavicular, and axillary nodes normal  Neurologic:   CNII-XII intact, normal strength, sensation and reflexes    throughout          Assessment &  Plan:

## 2021-04-09 NOTE — Assessment & Plan Note (Signed)
Check labs and replete prn. 

## 2021-04-09 NOTE — Assessment & Plan Note (Signed)
Ongoing issue for pt.  BMI is 36.94 but coupled w/ HTN, OSA, PVD, hyperglycemia this qualifies as morbidly obese.  Check labs to risk stratify.  Will follow.

## 2021-04-09 NOTE — Patient Instructions (Addendum)
Follow up in 6 months to recheck BP We'll notify you of your lab results and make any changes if needed Continue to work on healthy diet and regular physical activity Call with any questions or concerns Stay Safe!  Stay Healthy! Hang in there!!!

## 2021-04-09 NOTE — Assessment & Plan Note (Signed)
Pt's PE unchanged from previous and WNL w/ exception of obesity.  UTD on colonoscopy, PNA, COVID.  No longer wants to have mammograms done.  Check labs.  Anticipatory guidance provided.

## 2021-04-10 ENCOUNTER — Other Ambulatory Visit: Payer: Self-pay

## 2021-04-10 MED ORDER — VITAMIN D (ERGOCALCIFEROL) 1.25 MG (50000 UNIT) PO CAPS: 50000.0000 [IU] | ORAL_CAPSULE | ORAL | 0 refills | Status: DC

## 2021-04-16 DIAGNOSIS — H5310 Unspecified subjective visual disturbances: Secondary | ICD-10-CM | POA: Diagnosis not present

## 2021-04-16 DIAGNOSIS — H16223 Keratoconjunctivitis sicca, not specified as Sjogren's, bilateral: Secondary | ICD-10-CM | POA: Diagnosis not present

## 2021-04-17 ENCOUNTER — Telehealth: Payer: Medicare HMO

## 2021-05-07 ENCOUNTER — Ambulatory Visit (INDEPENDENT_AMBULATORY_CARE_PROVIDER_SITE_OTHER): Payer: Medicare Other

## 2021-05-07 DIAGNOSIS — I10 Essential (primary) hypertension: Secondary | ICD-10-CM | POA: Diagnosis not present

## 2021-05-07 DIAGNOSIS — F418 Other specified anxiety disorders: Secondary | ICD-10-CM

## 2021-05-07 DIAGNOSIS — E559 Vitamin D deficiency, unspecified: Secondary | ICD-10-CM

## 2021-05-07 MED ORDER — ESCITALOPRAM OXALATE 5 MG PO TABS: 5.0000 mg | ORAL_TABLET | Freq: Every day | ORAL | 0 refills | Status: DC

## 2021-05-07 NOTE — Progress Notes (Signed)
Chronic Care Management Pharmacy Note  05/07/2021 Name:  Jenny [name] MRN:  [id number] DOB:  1941/03/11  Recommendations/Changes made from today's visit: No Rx changes. Lexapro sent to mail order due to cost concerns.    Subjective: Jenny [name] is an 80 y.o. year old female who is a primary patient of Tabori, [name], MD.  The CCM team was consulted for assistance with disease management and care coordination needs.    Engaged with patient by telephone for follow up visit in response to provider referral for pharmacy case management and/or care coordination services.   Consent to Services:  The patient was given information about Chronic Care Management services, agreed to services, and gave verbal consent prior to initiation of services.  Please see initial visit note for detailed documentation.   Patient Care Team: Midge Minium, MD as PCP - General (Family Medicine) Noralee Space, MD as Consulting Physician (Pulmonary Disease) Ladene Artist, MD as Consulting Physician (Gastroenterology) Garrel Ridgel, DPM as Consulting Physician (Podiatry) Dian Queen, MD as Consulting Physician (Obstetrics and Gynecology) [name], [facility] as Pharmacist (Pharmacist)  Objective:  Lab Results  Component Value Date   CREATININE 0.91 04/09/2021   CREATININE 0.96 10/04/2020   CREATININE 0.83 10/03/2020    Lab Results  Component Value Date   HGBA1C 6.2 (H) 09/30/2020   Last diabetic Eye exam: No results found for: HMDIABEYEEXA  Last diabetic Foot exam: No results found for: HMDIABFOOTEX      Component Value Date/Time   CHOL 131 04/09/2021 1407   TRIG 127.0 04/09/2021 1407   HDL 37.90 (L) 04/09/2021 1407   CHOLHDL 3 04/09/2021 1407   VLDL 25.4 04/09/2021 1407   [place] 68 04/09/2021 1407    Hepatic Function Latest Ref Rng & Units 04/09/2021 10/04/2020 10/03/2020  Total Protein 6.0 - 8.3 g/dL 6.4 6.2(L) 6.7  Albumin 3.5 - 5.2 g/dL 4.0 3.3(L) 3.6  AST 0 -  37 U/L 14 31 31   ALT 0 - 35 U/L 9 37 26  Alk Phosphatase 39 - 117 U/L 83 73 70  Total Bilirubin 0.2 - 1.2 mg/dL 0.7 0.4 0.5  Bilirubin, Direct 0.0 - 0.3 mg/dL 0.2 - -    Lab Results  Component Value Date/Time   TSH 1.60 04/09/2021 02:07 PM   TSH 3.66 12/05/2019 09:43 AM   FREET4 1.45 08/03/2013 10:56 AM   FREET4 1.05 02/08/2013 09:39 AM    CBC Latest Ref Rng & Units 04/09/2021 10/04/2020 10/03/2020  WBC 4.0 - 10.5 K/uL 9.8 7.6 9.1  Hemoglobin 12.0 - 15.0 g/dL 13.4 14.3 14.0  Hematocrit 36.0 - 46.0 % 40.3 43.9 44.6  Platelets 150.0 - 400.0 K/uL 270.0 226 263    Lab Results  Component Value Date/Time   VD25OH 24.64 (L) 04/09/2021 02:07 PM   VD25OH 17.35 (L) 11/09/2019 09:31 AM    Clinical ASCVD:  The ASCVD Risk score Mikey Bussing DC Jr., et al., 2013) failed to calculate for the following reasons:   The 2013 ASCVD risk score is only valid for ages 51 to 69    Social History   Tobacco Use  Smoking Status Former   Packs/day: 1.50   Years: 30.00   Pack years: 45.00   Types: Cigarettes   Quit date: 10/13/1991   Years since quitting: 29.5  Smokeless Tobacco Never   BP Readings from Last 3 Encounters:  04/09/21 121/80  02/28/21 (!) 176/93  01/27/21 128/72   Pulse Readings from Last 3 Encounters:  04/09/21 71  02/28/21 75  01/27/21 73   Wt Readings from Last 3 Encounters:  04/09/21 215 lb 3.2 oz (97.6 kg)  01/27/21 212 lb 6.4 oz (96.3 kg)  12/25/20 213 lb 6.4 oz (96.8 kg)    Assessment: Review of patient past medical history, allergies, medications, health status, including review of consultants reports, laboratory and other test data, was performed as part of comprehensive evaluation and provision of chronic care management services.   SDOH:  (Social Determinants of Health) assessments and interventions performed: Yes   CCM Care Plan  Allergies  Allergen Reactions   Prednisone Other (See Comments)    Confusion, dizziness Medrol is ok   Codeine Nausea Only         Tramadol Itching    Medications Reviewed Today     Reviewed by [name], [facility] (Pharmacist) on 05/07/21 at [place] List Status: <None>   Medication Order Taking? Sig Documenting Provider Last Dose Status Informant  ALPRAZolam (XANAX) 0.5 MG tablet [id number] No TAKE 1/2 TO 1 TABLET BY MOUTH 3 TIMES DAILY AS NEEDED FOR ANXIETY Midge Minium, MD Taking Active   amLODipine (NORVASC) 5 MG tablet [id number] No TAKE 1 TABLET BY MOUTH  DAILY Midge Minium, MD Taking Active   benzonatate (TESSALON) 200 MG capsule [id number] No Take 1 capsule (200 mg total) by mouth 3 (three) times daily as needed for cough. Lavina Hamman, MD Taking Active   BINAXNOW COVID-19 AG HOME TEST KIT [id number]  See admin instructions. [provider]  Active   budesonide-formoterol [facility]) 160-4.5 MCG/ACT inhaler [id number] No Inhale 2 puffs into the lungs 2 (two) times daily. Parrett, Fonnie Mu, NP Taking Active Self  cetirizine (ZYRTEC) 10 MG tablet [id number] No Take 10 mg by mouth daily as needed. [provider] Taking Active Self  dupilumab (DUPIXENT) 300 MG/2ML prefilled syringe [id number] No Inject 300 mg into the skin every 14 (fourteen) days. Patient wants PFS Chesley Mires, MD Taking Active   escitalopram (LEXAPRO) 5 MG tablet [id number] No Take 1 tablet (5 mg total) by mouth daily. Midge Minium, MD Taking Active   Discontinued 12/18/11 [phone] (Error)            Med Note Domingo Cocking, CHRISTY L   Fri Dec 18, 2011  8:42 AM) Dr Marlyce Huge put pt on Zantac  fluticasone (FLONASE) 50 MCG/ACT nasal spray [id number] No PLACE 1 SPRAY INTO BOTH NOSTRILS DAILY. Chesley Mires, MD Taking Active Self           Med Note Maud Deed   Mon Sep 30, 2020  3:55 PM)    levothyroxine (SYNTHROID) 112 MCG tablet [id number] No TAKE 1 TABLET BY MOUTH EVERY DAY Midge Minium, MD Taking Active   pantoprazole (PROTONIX) 40 MG tablet [id number] No TAKE 1 TABLET BY MOUTH EVERY DAY 30 MINUTES BEFORE Audie Pinto, [place], MD Taking Active   valACYclovir (VALTREX) 1000 MG tablet [id number] No Take 1 tablet (1,000 mg total) by mouth 3 (three) times daily. Midge Minium, MD Taking Active   Vitamin D, Ergocalciferol, (DRISDOL) 1.25 MG (50000 UNIT) CAPS capsule [id number]  Take 1 capsule (50,000 Units total) by mouth every 7 (seven) days for 12 doses. 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   Hyperglycemia 07/07/2015   Annual physical exam 08/06/2013   Atrophic vaginitis 06/17/2012   Peripheral vascular disease, unspecified ([place]) 06/02/2012   Urticaria 10/15/2011   Anemia 09/21/2011   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   Vitamin D deficiency 02/12/2011   Peripheral neuropathy 02/12/2011   Intertrigo 62/70/3500   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   History of chicken pox 01/14/2011   History of measles 01/14/2011   OTHER URINARY INCONTINENCE 03/12/2010   Depression with anxiety 03/06/2008   Essential hypertension 02/22/2008   Chronic bronchitis ([place]) 02/22/2008   DIVERTICULOSIS OF COLON 02/22/2008   Unilateral primary osteoarthritis, left knee 02/22/2008   Hypothyroidism 08/23/2007   Morbid obesity ([place]) 08/23/2007   OSA on CPAP 08/23/2007   Venous (peripheral) insufficiency 08/23/2007   Allergic rhinitis 08/23/2007   GERD without esophagitis 08/23/2007   BACK PAIN, LUMBAR 08/23/2007    Immunization History  Administered Date(s) Administered   Fluad Quad(high Dose 65+) 08/02/2019, 08/05/2020   Influenza Split 08/20/2011, 09/30/2012   Influenza Whole 08/13/2013   Influenza, High Dose Seasonal PF 10/13/2014,  09/03/2015, 06/03/2017, 09/25/2018   Influenza-Unspecified 07/11/2016   Moderna Sars-Covid-2 Vaccination 11/17/2019, 12/24/2019   Pneumococcal Conjugate-13 03/16/2014   Pneumococcal Polysaccharide-23 06/23/2016   Tdap 09/22/2011   Zoster Recombinat (Shingrix) 03/16/2014   Zoster, Live 03/16/2014    Conditions to be addressed/monitored: HTN, HLD, COPD, and Depression  Care Plan : [place]  Updates made by [name], [facility] since 05/07/2021 12:00 AM     Problem: HTN, HLD, COPD, and Depression   Priority: High  Onset Date: 05/07/2021     Long-Range Goal: Disease Management   Start Date: 05/07/2021  This Visit's Progress: On track  Priority: High  Note:   Current Barriers:  Needing to start on Vitamin D3 2000 units daily and Vitamin D2 50,000 units once weekly x 12 weeks   Pharmacist Clinical Goal(s):  Patient will verbalize ability to afford treatment regimen contact provider office for questions/concerns as evidenced notation of same in electronic health record through collaboration with PharmD and provider.   Interventions: 1:1 collaboration with Midge Minium, MD regarding development and update of comprehensive plan of care as evidenced by provider attestation and co-signature Inter-disciplinary care team collaboration (see longitudinal plan of care) Comprehensive medication review performed; medication list updated in electronic medical record   Hypertension  (Status:Goal on track: YES.)   Med Management Intervention:  No changes   (BP goal <140/90) -Controlled -Current treatment: Amlodipine 5 mg once daily -Current exercise habits: limited due to fatigue following covid-19 infection -Denies hypotensive/hypertensive symptoms -Educated on BP goals and benefits of medications for prevention of heart attack, stroke and kidney damage; -Counseled to monitor BP at home 1-2x/week, document, and provide log at future appointments -Recommended to continue  current medication   Depression (Goal: minimize symptoms) -Controlled -Feels great about current regimen, denies any issues. Does want this sent in to mail order for them to have on file for next fill. No other issues.  -Current treatment: Escitalopram 5 mg once daily  -PHQ9: 0 -Educated on Benefits of medication for symptom control -Recommended to continue current medication Rx for Lexapro send to mail order   Vitamin D deficiency (Goal: Ensure appropriate use of medications) -Uncontrolled -some confusion over vitamin d3 and vitamin d2 dosing - clarified - will now  take as outlined below -Current treatment  Over the Counter: Vitamin D3 2000 units daily Prescription: Vitamin D2 50,000 units - take once every week for 12 weeks (in addition to Vitamin D3 2000 units daily)  -Counseled on appropriate use of medications CPA check on patient next month to ensure medication needs are being met.    Patient Goals/Self-Care Activities Patient will: - take medications as prescribed collaborate with provider on medication access solutions   Medication Assistance: None required.  Patient affirms current coverage meets needs.   Patient's preferred pharmacy is:  [employer], television[employer]film[employer]video  [facility]) - [address], [address] [address] Phone: [phone] Fax: [place], [address] [place] [address] Phone: [phone] Fax: [phone]  Follow Up:  Patient agrees to Care Plan and Follow-up.  Plan:  3 month [place] f/u . 1 month patient call (ensure financial needs are met)  Future Appointments  Date Time Provider [place]  05/15/2021  2:45 PM Trula Slade, DPM [facility]    [name], PharmD, CPP Clinical Pharmacist Practitioner  [facility]  [phone]

## 2021-05-15 ENCOUNTER — Ambulatory Visit: Payer: Medicare Other | Admitting: Podiatry

## 2021-05-22 ENCOUNTER — Telehealth: Payer: Self-pay | Admitting: Family Medicine

## 2021-05-22 NOTE — Telephone Encounter (Signed)
Unable to leave message for patient to call back and schedule Medicare Annual Wellness Visit (AWV) voice mail was full.   Please offer to do virtually or by telephone.  Left office number and my jabber [phone].  Last AWV:04/01/2020  Please schedule at anytime with Nurse Health Advisor.

## 2021-05-27 ENCOUNTER — Other Ambulatory Visit: Payer: Self-pay | Admitting: Family Medicine

## 2021-05-27 NOTE — Telephone Encounter (Signed)
LFD 03/28/21 #90 with no refills LOV 04/09/21 NOV none

## 2021-05-28 ENCOUNTER — Telehealth: Payer: Self-pay

## 2021-05-28 NOTE — Progress Notes (Signed)
    Chronic Care Management Pharmacy Assistant   Name: Jenny [name]  MRN: [id number] DOB: November 30, 1940   Reason for Encounter: Disease State - General Adherence Call    Recent office visits:  None noted.   Recent consult visits:  None noted.  Hospital visits:  None in previous 6 months  Medications: Outpatient Encounter Medications as of 05/28/2021  Medication Sig Note   ALPRAZolam (XANAX) 0.5 MG tablet TAKE 1/2 TO 1 TABLET BY MOUTH 3 TIMES DAILY AS NEEDED FOR ANXIETY    amLODipine (NORVASC) 5 MG tablet TAKE 1 TABLET BY MOUTH  DAILY    benzonatate (TESSALON) 200 MG capsule Take 1 capsule (200 mg total) by mouth 3 (three) times daily as needed for cough.    BINAXNOW COVID-19 AG HOME TEST KIT See admin instructions.    budesonide-formoterol (SYMBICORT) 160-4.5 MCG/ACT inhaler Inhale 2 puffs into the lungs 2 (two) times daily.    cetirizine (ZYRTEC) 10 MG tablet Take 10 mg by mouth daily as needed.    dupilumab (DUPIXENT) 300 MG/2ML prefilled syringe Inject 300 mg into the skin every 14 (fourteen) days. Patient wants PFS    escitalopram (LEXAPRO) 5 MG tablet Take 1 tablet (5 mg total) by mouth daily.    fluticasone (FLONASE) 50 MCG/ACT nasal spray PLACE 1 SPRAY INTO BOTH NOSTRILS DAILY.    levothyroxine (SYNTHROID) 112 MCG tablet TAKE 1 TABLET BY MOUTH EVERY DAY    pantoprazole (PROTONIX) 40 MG tablet TAKE 1 TABLET BY MOUTH EVERY DAY 30 MINUTES BEFORE DINNER    valACYclovir (VALTREX) 1000 MG tablet Take 1 tablet (1,000 mg total) by mouth 3 (three) times daily.    Vitamin D, Ergocalciferol, (DRISDOL) 1.25 MG (50000 UNIT) CAPS capsule Take 1 capsule (50,000 Units total) by mouth every 7 (seven) days for 12 doses.    [DISCONTINUED] esomeprazole (NEXIUM) 40 MG capsule Take 40 mg by mouth daily before breakfast.   12/18/2011: Dr Marlyce Huge put pt on Zantac   No facility-administered encounter medications on file as of 05/28/2021.    Have you had any problems recently with your  health? Patient denied having and recent problems with her health.   Have you had any problems with your pharmacy? Patient denied having and issues or problems with her current pharmacy.   What issues or side effects are you having with your medications? Patient denied any issues or side effects with her current medications.   What would you like me to pass along to [name], CPP for them to help you with?  Patient did not have anything she would like to pass along at this time. She reports she is doing well.   What can we do to take care of you better? Patient did not have and suggestions. She is satisfied with her current level of care.   Star Rating Drugs: No Star Rating Drugs noted.    Future Appointments  Date Time Provider [place]  05/29/2021  3:15 PM Trula Slade, DPM [facility]   Patient wished to wait until October to scheduled a follow up with CPP. She wishes to have a call to schedule when it is closer to this time.   Jenny [name], [place] Pharmacist Assistant  [phone]  Time Spent: 40 minutes

## 2021-05-29 ENCOUNTER — Other Ambulatory Visit: Payer: Self-pay

## 2021-05-29 ENCOUNTER — Ambulatory Visit: Payer: Medicare Other

## 2021-05-29 ENCOUNTER — Ambulatory Visit: Payer: Medicare Other | Admitting: Podiatry

## 2021-05-29 DIAGNOSIS — M7742 Metatarsalgia, left foot: Secondary | ICD-10-CM

## 2021-05-29 DIAGNOSIS — Q828 Other specified congenital malformations of skin: Secondary | ICD-10-CM | POA: Diagnosis not present

## 2021-06-01 NOTE — Progress Notes (Signed)
Subjective: 80 year old female presents the office today for concerns of calluses to her left foot.  She states that after her last appointment feeling much better however started come back causing discomfort.  No swelling redness or drainage.  No open sores that she reports.  No other concerns.  Objective: AAO x3, NAD DP/PT pulses palpable bilaterally, CRT less than 3 seconds Hyperkeratotic lesions left foot submetatarsal 1, 5 without any underlying ulceration drainage or signs of infection.  No edema, erythema. No pain with calf compression, swelling, warmth, erythema  Assessment: Hyperkeratotic lesions left foot  Plan: -All treatment options discussed with the patient including all alternatives, risks, complications.  -Sharply debrided the hyperkeratotic lesions x2 without any complications or bleeding. -Patient encouraged to call the office with any questions, concerns, change in symptoms.   Trula Slade DPM

## 2021-06-25 ENCOUNTER — Other Ambulatory Visit: Payer: Self-pay | Admitting: Family Medicine

## 2021-06-25 ENCOUNTER — Telehealth: Payer: Self-pay | Admitting: Pulmonary Disease

## 2021-06-25 DIAGNOSIS — J4550 Severe persistent asthma, uncomplicated: Secondary | ICD-10-CM

## 2021-06-25 DIAGNOSIS — J849 Interstitial pulmonary disease, unspecified: Secondary | ICD-10-CM

## 2021-06-25 MED ORDER — DUPIXENT 300 MG/2ML ~~LOC~~ SOAJ: 300.0000 mg | SUBCUTANEOUS | 0 refills | Status: DC

## 2021-06-25 NOTE — Telephone Encounter (Signed)
Refill for Dupixent sent to [facility] for autoinjector.  Called patient to confirm this is what patient wants.  Patient needs f/u appt with Dr. Halford Chessman. Last seen 01/27/21 with expected f/u in October 2022. Routing to scheduling team to assist  Knox Saliva, PharmD, MPH, BCPS Clinical Pharmacist (Rheumatology and Pulmonology)

## 2021-06-27 NOTE — Telephone Encounter (Signed)
Patient has been scheduled with Dr. Halford Chessman on 07/29/2021 at [place] for 3 month/asthma biologic follow up.

## 2021-06-28 ENCOUNTER — Other Ambulatory Visit: Payer: Self-pay | Admitting: Family Medicine

## 2021-07-04 ENCOUNTER — Ambulatory Visit (INDEPENDENT_AMBULATORY_CARE_PROVIDER_SITE_OTHER): Payer: Medicare Other | Admitting: Family Medicine

## 2021-07-04 DIAGNOSIS — Z0000 Encounter for general adult medical examination without abnormal findings: Secondary | ICD-10-CM

## 2021-07-04 NOTE — Patient Instructions (Signed)
Health Maintenance, Female Adopting a healthy lifestyle and getting preventive care are important in promoting health and wellness. Ask your health care provider about: The right schedule for you to have regular tests and exams. Things you can do on your own to prevent diseases and keep yourself healthy. What should I know about diet, weight, and exercise? Eat a healthy diet  Eat a diet that includes plenty of vegetables, fruits, low-fat dairy products, and lean protein. Do not eat a lot of foods that are high in solid fats, added sugars, or sodium. Maintain a healthy weight Body mass index (BMI) is used to identify weight problems. It estimates body fat based on height and weight. Your health care provider can help determine your BMI and help you achieve or maintain a healthy weight. Get regular exercise Get regular exercise. This is one of the most important things you can do for your health. Most adults should: Exercise for at least 150 minutes each week. The exercise should increase your heart rate and make you sweat (moderate-intensity exercise). Do strengthening exercises at least twice a week. This is in addition to the moderate-intensity exercise. Spend less time sitting. Even light physical activity can be beneficial. Watch cholesterol and blood lipids Have your blood tested for lipids and cholesterol at 82 years of age, then have this test every 5 years. Have your cholesterol levels checked more often if: Your lipid or cholesterol levels are high. You are older than 82 years of age. You are at high risk for heart disease. What should I know about cancer screening? Depending on your health history and family history, you may need to have cancer screening at various ages. This may include screening for: Breast cancer. Cervical cancer. Colorectal cancer. Skin cancer. Lung cancer. What should I know about heart disease, diabetes, and high blood pressure? Blood pressure and heart  disease High blood pressure causes heart disease and increases the risk of stroke. This is more likely to develop in people who have high blood pressure readings, are of African descent, or are overweight. Have your blood pressure checked: Every 3-5 years if you are 18-39 years of age. Every year if you are 40 years old or older. Diabetes Have regular diabetes screenings. This checks your fasting blood sugar level. Have the screening done: Once every three years after age 40 if you are at a normal weight and have a low risk for diabetes. More often and at a younger age if you are overweight or have a high risk for diabetes. What should I know about preventing infection? Hepatitis B If you have a higher risk for hepatitis B, you should be screened for this virus. Talk with your health care provider to find out if you are at risk for hepatitis B infection. Hepatitis C Testing is recommended for: Everyone born from 1945 through 1965. Anyone with known risk factors for hepatitis C. Sexually transmitted infections (STIs) Get screened for STIs, including gonorrhea and chlamydia, if: You are sexually active and are younger than 82 years of age. You are older than 82 years of age and your health care provider tells you that you are at risk for this type of infection. Your sexual activity has changed since you were last screened, and you are at increased risk for chlamydia or gonorrhea. Ask your health care provider if you are at risk. Ask your health care provider about whether you are at high risk for HIV. Your health care provider may recommend a prescription medicine   to help prevent HIV infection. If you choose to take medicine to prevent HIV, you should first get tested for HIV. You should then be tested every 3 months for as long as you are taking the medicine. Pregnancy If you are about to stop having your period (premenopausal) and you may become pregnant, seek counseling before you get  pregnant. Take 400 to 800 micrograms (mcg) of folic acid every day if you become pregnant. Ask for birth control (contraception) if you want to prevent pregnancy. Osteoporosis and menopause Osteoporosis is a disease in which the bones lose minerals and strength with aging. This can result in bone fractures. If you are 65 years old or older, or if you are at risk for osteoporosis and fractures, ask your health care provider if you should: Be screened for bone loss. Take a calcium or vitamin D supplement to lower your risk of fractures. Be given hormone replacement therapy (HRT) to treat symptoms of menopause. Follow these instructions at home: Lifestyle Do not use any products that contain nicotine or tobacco, such as cigarettes, e-cigarettes, and chewing tobacco. If you need help quitting, ask your health care provider. Do not use street drugs. Do not share needles. Ask your health care provider for help if you need support or information about quitting drugs. Alcohol use Do not drink alcohol if: Your health care provider tells you not to drink. You are pregnant, may be pregnant, or are planning to become pregnant. If you drink alcohol: Limit how much you use to 0-1 drink a day. Limit intake if you are breastfeeding. Be aware of how much alcohol is in your drink. In the [place] one drink equals one 12 oz bottle of beer (355 mL), one 5 oz glass of wine (148 mL), or one 1 oz glass of hard liquor (44 mL). General instructions Schedule regular health, dental, and eye exams. Stay current with your vaccines. Tell your health care provider if: You often feel depressed. You have ever been abused or do not feel safe at home. Summary Adopting a healthy lifestyle and getting preventive care are important in promoting health and wellness. Follow your health care provider's instructions about healthy diet, exercising, and getting tested or screened for diseases. Follow your health care provider's  instructions on monitoring your cholesterol and blood pressure. This information is not intended to replace advice given to you by your health care provider. Make sure you discuss any questions you have with your health care provider. Document Revised: 12/06/2020 Document Reviewed: 09/21/2018 Elsevier Patient Education  2022 Elsevier Inc.  

## 2021-07-04 NOTE — Progress Notes (Signed)
Subjective:   Jenny [name] is a 80 y.o. female who presents for Medicare Annual (Subsequent) preventive examination.  I connected with  [name] on 07/04/21 by audio enabled telemedicine application and verified that I am speaking with the correct person using two identifiers.   I discussed the limitations of evaluation and management by telemedicine. The patient expressed understanding and agreed to proceed.   Location of Patient: Home Location of Provider: Office   Review of Systems     Defer to PCP Cardiac Risk Factors include: advanced age (>3mn, >>52women);hypertension     Objective:    Today's Vitals   07/04/21 1454  PainSc: 5    There is no height or weight on file to calculate BMI.  Advanced Directives 07/04/2021 09/29/2020 05/07/2020 04/28/2020 04/01/2020 06/03/2017 08/10/2016  Does Patient Have a Medical Advance Directive? Yes No _0   Type of Advance Directive Living will;Healthcare Power of [place] Living will;Healthcare Power of [place]  Does patient want to make changes to medical advance directive? Yes (MAU/Ambulatory/Procedural Areas - Information given) - - - - - -  Copy of [place] Chart? No - copy requested - No - copy requested - No - copy requested No - copy requested -  Would patient like information on creating a medical advance directive? - No - Patient declined - - - - -  Pre-existing out of facility DNR order (yellow form or pink MOST form) - - - - - - -    Current Medications (verified) Outpatient Encounter Medications as of 07/04/2021  Medication Sig   ALPRAZolam (XANAX) 0.5 MG tablet TAKE 1/2 TO 1 TABLET BY MOUTH 3 TIMES DAILY AS NEEDED FOR ANXIETY   amLODipine (NORVASC) 5 MG tablet TAKE 1 TABLET BY MOUTH  DAILY   benzonatate  (TESSALON) 200 MG capsule Take 1 capsule (200 mg total) by mouth 3 (three) times daily as needed for cough.   BINAXNOW COVID-19 AG HOME TEST KIT See admin instructions.   budesonide-formoterol (SYMBICORT) 160-4.5 MCG/ACT inhaler Inhale 2 puffs into the lungs 2 (two) times daily.   cetirizine (ZYRTEC) 10 MG tablet Take 10 mg by mouth daily as needed.   Dupilumab (DUPIXENT) 300 MG/2ML SOPN Inject 300 mg into the skin every 14 (fourteen) days.   escitalopram (LEXAPRO) 5 MG tablet Take 1 tablet (5 mg total) by mouth daily.   fluticasone (FLONASE) 50 MCG/ACT nasal spray PLACE 1 SPRAY INTO BOTH NOSTRILS DAILY.   levothyroxine (SYNTHROID) 112 MCG tablet TAKE 1 TABLET BY MOUTH EVERY DAY   pantoprazole (PROTONIX) 40 MG tablet TAKE 1 TABLET BY MOUTH EVERY DAY 30 MINUTES BEFORE DINNER   valACYclovir (VALTREX) 1000 MG tablet Take 1 tablet (1,000 mg total) by mouth 3 (three) times daily.   Vitamin D, Ergocalciferol, (DRISDOL) 1.25 MG (50000 UNIT) CAPS capsule TAKE 1 CAPSULE (50,000 UNITS TOTAL) BY MOUTH EVERY 7 (SEVEN) DAYS FOR 12 DOSES.   [DISCONTINUED] esomeprazole (NEXIUM) 40 MG capsule Take 40 mg by mouth daily before breakfast.     No facility-administered encounter medications on file as of 07/04/2021.    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   Anemia 09/21/2011   Annual physical  exam 08/06/2013   Asthma    mild, intermittent   Asthma 01/14/2011   Atrophic vaginitis 06/17/2012   BACK PAIN, LUMBAR 08/23/2007   Bronchitis    BRONCHITIS, RECURRENT 02/22/2008   Cough    usually not productive   Degenerative joint disease    DEGENERATIVE JOINT DISEASE 02/22/2008   Depression with anxiety 03/06/2008   Qualifier: Diagnosis of  By: Lenna Gilford MD, Deborra Medina    Diverticulosis of colon    DIVERTICULOSIS OF COLON 02/22/2008   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   elevated glucose 08/21/2008   GERD 08/23/2007   GERD (gastroesophageal reflux disease)    HAND PAIN 04/09/2010   History of chicken pox 01/14/2011   History of measles 01/14/2011   Hyperglycemia 07/07/2015   Hypertension    HYPERTENSION 02/22/2008   Hypothyroidism    HYPOTHYROIDISM 08/23/2007   Incontinence of urine    Lumbar back pain    Obesity    OBESITY 08/23/2007   Obstructive sleep apnea    does not use CPAP, lost weight   Other urinary incontinence 03/12/2010   Peripheral neuropathy 02/12/2011   possibly related to spondylolisthesis per pt   PONV (postoperative nausea and vomiting)    Sleep apnea    SLEEP APNEA, OBSTRUCTIVE 08/23/2007   Transient memory loss March 2006   after a stressful event she had no memory the rest of that day   Urinary frequency    Urinary incontinence    UTI (lower urinary tract infection) 03/23/2012   Venous insufficiency    VENOUS INSUFFICIENCY 08/23/2007   Vitamin D deficiency    VITAMIN D DEFICIENCY 08/21/2008   Past Surgical History:  Procedure Laterality Date   ABDOMINAL HYSTERECTOMY     took one ovary   AP repair and sling for cystocele  8-04    Dr Gaynelle Arabian   APPENDECTOMY     carpal tunel  2008   CHOLECYSTECTOMY     ctr     bilateral   EYE SURGERY     left hand  2010   left thumb, joint removal, CTR   NECK SURGERY     x 2 - discectomy, fusion   SHOULDER ARTHROSCOPY DISTAL CLAVICLE EXCISION AND OPEN ROTATOR CUFF REPAIR     right   TONSILLECTOMY AND ADENOIDECTOMY     TUBAL LIGATION     Family History  Problem Relation Age of Onset   COPD Mother    Pneumonia Mother    Other Mother        CHF   Hypertension Mother    Arthritis Mother        septic knee after replacement   Heart disease Mother        chf   Dementia Father    Arthritis Brother    Asthma Daughter    Colon polyps Daughter    Stroke Maternal Grandmother    Heart disease Maternal Grandmother    Other Maternal Grandfather        CHF   Heart  disease Maternal Grandfather        chf   Cancer Paternal Grandmother        stomach?   Other Paternal Grandfather        problems with kidneys   Kidney disease Paternal Grandfather    Anxiety disorder Daughter    Cancer Maternal Aunt        breast   Cancer Maternal Aunt        liver   Colon  cancer Neg Hx    Social History   Socioeconomic History   Marital status: Widowed    Spouse name: Not on file   Number of children: Not on file   Years of education: Not on file   Highest education level: Not on file  Occupational History   Occupation: Retired  Tobacco Use   Smoking status: Former    Packs/day: 1.50    Years: 30.00    Pack years: 45.00    Types: Cigarettes    Quit date: 10/13/1991    Years since quitting: 29.7   Smokeless tobacco: Never  Vaping Use   Vaping Use: Never used  Substance and Sexual Activity   Alcohol use: No    Alcohol/week: 0.0 standard drinks   Drug use: No   Sexual activity: Never  Other Topics Concern   Not on file  Social History Narrative   Not on file   Social Determinants of Health   Financial Resource Strain: Not on file  Food Insecurity: No Food Insecurity   Worried About [place] in the Last Year: Never true   [place] in the Last Year: Never true  Transportation Needs: No Transportation Needs   Lack of Transportation (Medical): No   Lack of Transportation (Non-Medical): No  Physical Activity: Inactive   Days of Exercise per Week: 0 days   Minutes of Exercise per Session: 0 min  Stress: Not on file  Social Connections: Moderately Integrated   Frequency of Communication with Friends and Family: More than three times a week   Frequency of Social Gatherings with Friends and Family: More than three times a week   Attends Religious Services: 1 to 4 times per year   Active Member of [employer] or Organizations: Yes   Attends Archivist Meetings: 1 to 4 times per year   Marital Status: Widowed    Tobacco  Counseling Counseling given: Not Answered   Clinical Intake:  Pre-visit preparation completed: Yes  Pain : 0-10 Pain Score: 5  Pain Type: Chronic pain Pain Location: Back (Chronic back pain history of surgery) Pain Orientation: Lower Pain Radiating Towards: denies radiation Pain Descriptors / Indicators: Aching, Sore Pain Onset: More than a month ago Pain Frequency: Constant Pain Relieving Factors: Ibuprofen with minimal relief  Pain Relieving Factors: Ibuprofen with minimal relief  Diabetes: No  How often do you need to have someone help you when you read instructions, pamphlets, or other written materials from your doctor or pharmacy?: 1 - Never  Diabetic? No  Interpreter Needed?: No  Information entered by :: Lindalou Hose, CMA   Activities of Daily Living In your present state of health, do you have any difficulty performing the following activities: 07/04/2021 04/09/2021  Hearing? Y N  Comment decreased needs hearing aids  Vision? Y Y  Comment cataracts catarat problems  Difficulty concentrating or making decisions? N N  Walking or climbing stairs? Y N  Comment - -  Dressing or bathing? N N  Doing errands, shopping? N N  Preparing Food and eating ? N -  Using the Toilet? N -  In the past six months, have you accidently leaked urine? N -  Do you have problems with loss of bowel control? N -  Managing your Medications? N -  Managing your Finances? N -  Housekeeping or managing your Housekeeping? N -  Some recent data might be hidden    Patient Care Team: Midge Minium, MD as PCP -  General (Family Medicine) Noralee Space, MD as Consulting Physician (Pulmonary Disease) Ladene Artist, MD as Consulting Physician (Gastroenterology) Garrel Ridgel, [place] as Consulting Physician (Podiatry) Dian Queen, MD as Consulting Physician (Obstetrics and Gynecology) [name], [facility] as Pharmacist (Pharmacist)  Indicate any recent Medical Services you may  have received from other than [facility] providers in the past year (date may be approximate).     Assessment:   This is a routine wellness examination for [place].  Hearing/Vision screen No results found.  Dietary issues and exercise activities discussed: Current Exercise Habits: The patient does not participate in regular exercise at present, Exercise limited by: respiratory conditions(s)   Goals Addressed   None   Depression Screen PHQ 2/9 Scores 07/04/2021 04/09/2021 12/25/2020 12/04/2020 10/15/2020 09/25/2020 04/26/2020  PHQ - 2 Score 6 6 0 0 0 0 0  PHQ- 9 Score 13 14 0 0 0 0 0    Fall Risk Fall Risk  07/04/2021 04/09/2021 12/25/2020 12/04/2020 10/15/2020  Falls in the past year? 0 0 0 0 0  Comment - - - - -  Number falls in past yr: 0 0 0 0 0  Injury with Fall? - 0 0 0 0  Risk for fall due to : - No Fall Risks No Fall Risks No Fall Risks No Fall Risks  Follow up - - - - -    FALL RISK PREVENTION PERTAINING TO THE HOME:  Any stairs in or around the home? No  If so, are there any without handrails? No  Home free of loose throw rugs in walkways, pet beds, electrical cords, etc? Yes  Adequate lighting in your home to reduce risk of falls? Yes   ASSISTIVE DEVICES UTILIZED TO PREVENT FALLS:  Life alert? No  Use of a cane, walker or w/c? No  Grab bars in the bathroom? No  Shower chair or bench in shower? No  Elevated toilet seat or a handicapped toilet? No   TIMED UP AND GO:  Was the test performed? No .  Length of time to ambulate 10 feet: 0 sec.     Cognitive Function:     6CIT Screen 07/04/2021 04/01/2020  What Year? 0 points 0 points  What month? 0 points 0 points  What time? 0 points 0 points  Count back from 20 0 points 0 points  Months in reverse 0 points 0 points  Repeat phrase 0 points 0 points  Total Score 0 0    Immunizations Immunization History  Administered Date(s) Administered   Fluad Quad(high Dose 65+) 08/02/2019, 08/05/2020   Influenza Split  08/20/2011, 09/30/2012   Influenza Whole 08/13/2013   Influenza, High Dose Seasonal PF 10/13/2014, 09/03/2015, 06/03/2017, 09/25/2018   Influenza-Unspecified 07/11/2016   Moderna Sars-Covid-2 Vaccination 11/17/2019, 12/24/2019   Pneumococcal Conjugate-13 03/16/2014   Pneumococcal Polysaccharide-23 06/23/2016   Tdap 09/22/2011   Zoster Recombinat (Shingrix) 03/16/2014   Zoster, Live 03/16/2014    TDAP status: Up to date  Flu Vaccine status: Due, Education has been provided regarding the importance of this vaccine. Advised may receive this vaccine at local pharmacy or Health Dept. Aware to provide a copy of the vaccination record if obtained from local pharmacy or Health Dept. Verbalized acceptance and understanding. Patient states she has appointment  Pneumococcal vaccine status: Up to date  Covid-19 vaccine status: Completed vaccines  Qualifies for Shingles Vaccine? Yes   Zostavax completed Yes   Shingrix Completed?: No.    Education has been provided regarding the importance of  this vaccine. Patient has been advised to call insurance company to determine out of pocket expense if they have not yet received this vaccine. Advised may also receive vaccine at local pharmacy or Health Dept. Verbalized acceptance and understanding.  Screening Tests Health Maintenance  Topic Date Due   Zoster Vaccines- Shingrix (2 of 2) 05/11/2014   COVID-19 Vaccine (3 - Booster for Moderna series) 05/25/2020   INFLUENZA VACCINE  05/12/2021   TETANUS/TDAP  09/21/2021   DEXA SCAN  Completed   HPV VACCINES  Aged Out    Health Maintenance  Health Maintenance Due  Topic Date Due   Zoster Vaccines- Shingrix (2 of 2) 05/11/2014   COVID-19 Vaccine (3 - Booster for Moderna series) 05/25/2020   INFLUENZA VACCINE  05/12/2021    Colorectal cancer screening: Type of screening: Colonoscopy. Completed 08/24/2016. Repeat every 0 years age out  Mammogram status: Completed 06/29/2017. Repeat every year Patient  declines   Bone Density status: Completed 06/29/2017. Results reflect: Bone density results: NORMAL. Repeat every   years.  Lung Cancer Screening: (Low Dose CT Chest recommended if Age 47-80 years, 30 pack-year currently smoking OR have quit w/in 15years.) does qualify.   Lung Cancer Screening Referral: No  Additional Screening:  Hepatitis C Screening: does not qualify  Vision Screening: Recommended annual ophthalmology exams for early detection of glaucoma and other disorders of the eye. Is the patient up to date with their annual eye exam?  Yes  Who is the provider or what is the name of the office in which the patient attends annual eye exams? Dr Marina Gravel 08/2020 If pt is not established with a provider, would they like to be referred to a provider to establish care? No .   Dental Screening: Recommended annual dental exams for proper oral hygiene  Community Resource Referral / Chronic Care Management: CRR required this visit?  No   CCM required this visit?  No      Plan:     I have personally reviewed and noted the following in the patient's chart:   Medical and social history Use of alcohol, tobacco or illicit drugs  Current medications and supplements including opioid prescriptions.  Functional ability and status Nutritional status Physical activity Advanced directives List of other physicians Hospitalizations, surgeries, and ER visits in previous 12 months Vitals Screenings to include cognitive, depression, and falls Referrals and appointments  In addition, I have reviewed and discussed with patient certain preventive protocols, quality metrics, and best practice recommendations. A written personalized care plan for preventive services as well as general preventive health recommendations were provided to patient.     Lurlean Nanny, [place]   07/04/2021   Nurse Notes: Non-Face to Face 36 minute visit    Ms. Mulcahey ,  Thank you for taking time to come for your  Medicare Wellness Visit. I appreciate your ongoing commitment to your health goals. Please review the following plan we discussed and let me know if I can assist you in the future.   These are the goals we discussed:  Goals      <enter goal here>     Maintain current health by staying as active as possible.       PharmD Care Plan     CARE PLAN ENTRY  Current Barriers:  Chronic Disease Management support, education, and care coordination needs related to HTN and hypothyroidism , GERD, depression.  Pharmacist Clinical Goal(s):  BP <140/90 TSH 0.35-4.5 Receive Shingrix vaccine series over the next year  Interventions: Comprehensive medication review performed. Will refile for patient assistance with patient.  Patient Self Care Activities:  Fill out your section of patient assistance application and drop off with Dr. Halford Chessman next appointment.  Patient verbalizes understanding of plan to work with pharmacist on patient assistance. Try keeping an eye on your home blood pressure, checking at least once every 1-2 weeks. Document and provide at future visits.  Please call me with any questions at [phone].  Initial goal documentation.        This is a list of the screening recommended for you and due dates:  Health Maintenance  Topic Date Due   Zoster (Shingles) Vaccine (2 of 2) 05/11/2014   COVID-19 Vaccine (3 - Booster for Moderna series) 05/25/2020   Flu Shot  05/12/2021   Tetanus Vaccine  09/21/2021   DEXA scan (bone density measurement)  Completed   HPV Vaccine  Aged Out     Reviewed documentation provided and agree w/ above.  [name], MD

## 2021-07-14 ENCOUNTER — Telehealth: Payer: Self-pay

## 2021-07-14 NOTE — Progress Notes (Signed)
Chronic Care Management Pharmacy Assistant   Name: Jenny ALVIZO  MRN: [id number] DOB: Jan 26, 1941   Reason for Encounter: Disease State - General Adherence Call / Scheduled Follow up CPP Phone Call    Recent office visits:  07/04/21 [name], MD (PCP) - Family Medicine - Encounter for Medicare Wellness Completed.   Recent consult visits:  05/29/21 Celesta Gentile, MD - Podiatry - Porokeratosis - Sharply debrided the hyperkeratotic lesions x2 without any complications or bleeding. Follow up in 3 months.    Hospital visits:  None in previous 6 months  Medications: Outpatient Encounter Medications as of 07/14/2021  Medication Sig Note   ALPRAZolam (XANAX) 0.5 MG tablet TAKE 1/2 TO 1 TABLET BY MOUTH 3 TIMES DAILY AS NEEDED FOR ANXIETY    amLODipine (NORVASC) 5 MG tablet TAKE 1 TABLET BY MOUTH  DAILY    benzonatate (TESSALON) 200 MG capsule Take 1 capsule (200 mg total) by mouth 3 (three) times daily as needed for cough.    BINAXNOW COVID-19 AG HOME TEST KIT See admin instructions.    budesonide-formoterol (SYMBICORT) 160-4.5 MCG/ACT inhaler Inhale 2 puffs into the lungs 2 (two) times daily.    cetirizine (ZYRTEC) 10 MG tablet Take 10 mg by mouth daily as needed.    Dupilumab (DUPIXENT) 300 MG/2ML SOPN Inject 300 mg into the skin every 14 (fourteen) days.    escitalopram (LEXAPRO) 5 MG tablet Take 1 tablet (5 mg total) by mouth daily.    fluticasone (FLONASE) 50 MCG/ACT nasal spray PLACE 1 SPRAY INTO BOTH NOSTRILS DAILY.    levothyroxine (SYNTHROID) 112 MCG tablet TAKE 1 TABLET BY MOUTH EVERY DAY    pantoprazole (PROTONIX) 40 MG tablet TAKE 1 TABLET BY MOUTH EVERY DAY 30 MINUTES BEFORE DINNER    valACYclovir (VALTREX) 1000 MG tablet Take 1 tablet (1,000 mg total) by mouth 3 (three) times daily.    Vitamin D, Ergocalciferol, (DRISDOL) 1.25 MG (50000 UNIT) CAPS capsule TAKE 1 CAPSULE (50,000 UNITS TOTAL) BY MOUTH EVERY 7 (SEVEN) DAYS FOR 12 DOSES.    [DISCONTINUED]  esomeprazole (NEXIUM) 40 MG capsule Take 40 mg by mouth daily before breakfast.   12/18/2011: Dr Marlyce Huge put pt on Zantac   No facility-administered encounter medications on file as of 07/14/2021.    Have you had any problems recently with your health? Patient denied any recent problems with her health.   Have you had any problems with your pharmacy? Patient denied having any problems with her current pharmacy.  What issues or side effects are you having with your medications? Patient denied any issues or side effects with her current medications.   What would you like me to pass along to [name], CPP for them to help you with?  Patient did not have anything to pass along to CPP at this time.  What can we do to take care of you better? Patient had no recommendations at this time and is satisfied with her current  level of care.   Care Gaps  AWV: done 07/04/21 Colonoscopy: done 08/24/16 DM Eye Exam: N/A DM Foot Exam: done 05/29/21  Microalbumin: N/A HbgAIC: done 09/30/20 (6.2) DEXA: done 06/29/17 Mammogram: done 06/29/17   Star Rating Drugs: No Star Rating Drugs Noted.   Future Appointments  Date Time Provider [place]  07/29/2021  9:00 AM Chesley Mires, MD [facility] None   Patient scheduled for Follow up phone CPP visit for : 08/20/21 @ 4:00 pm. Patient confirmed appointment date and time.  Jobe Gibbon, [place] Pharmacist Assistant  [phone]  Time Spent: 32 minutes

## 2021-07-16 IMAGING — DX DG CHEST 2V
2 series · 2 of 2 positions shown · non-contrast
Comparison: 07/06/2018 chest radiograph.

CLINICAL DATA: COPD with acute exacerbation

EXAM:
CHEST - 2 VIEW

[chest pa]
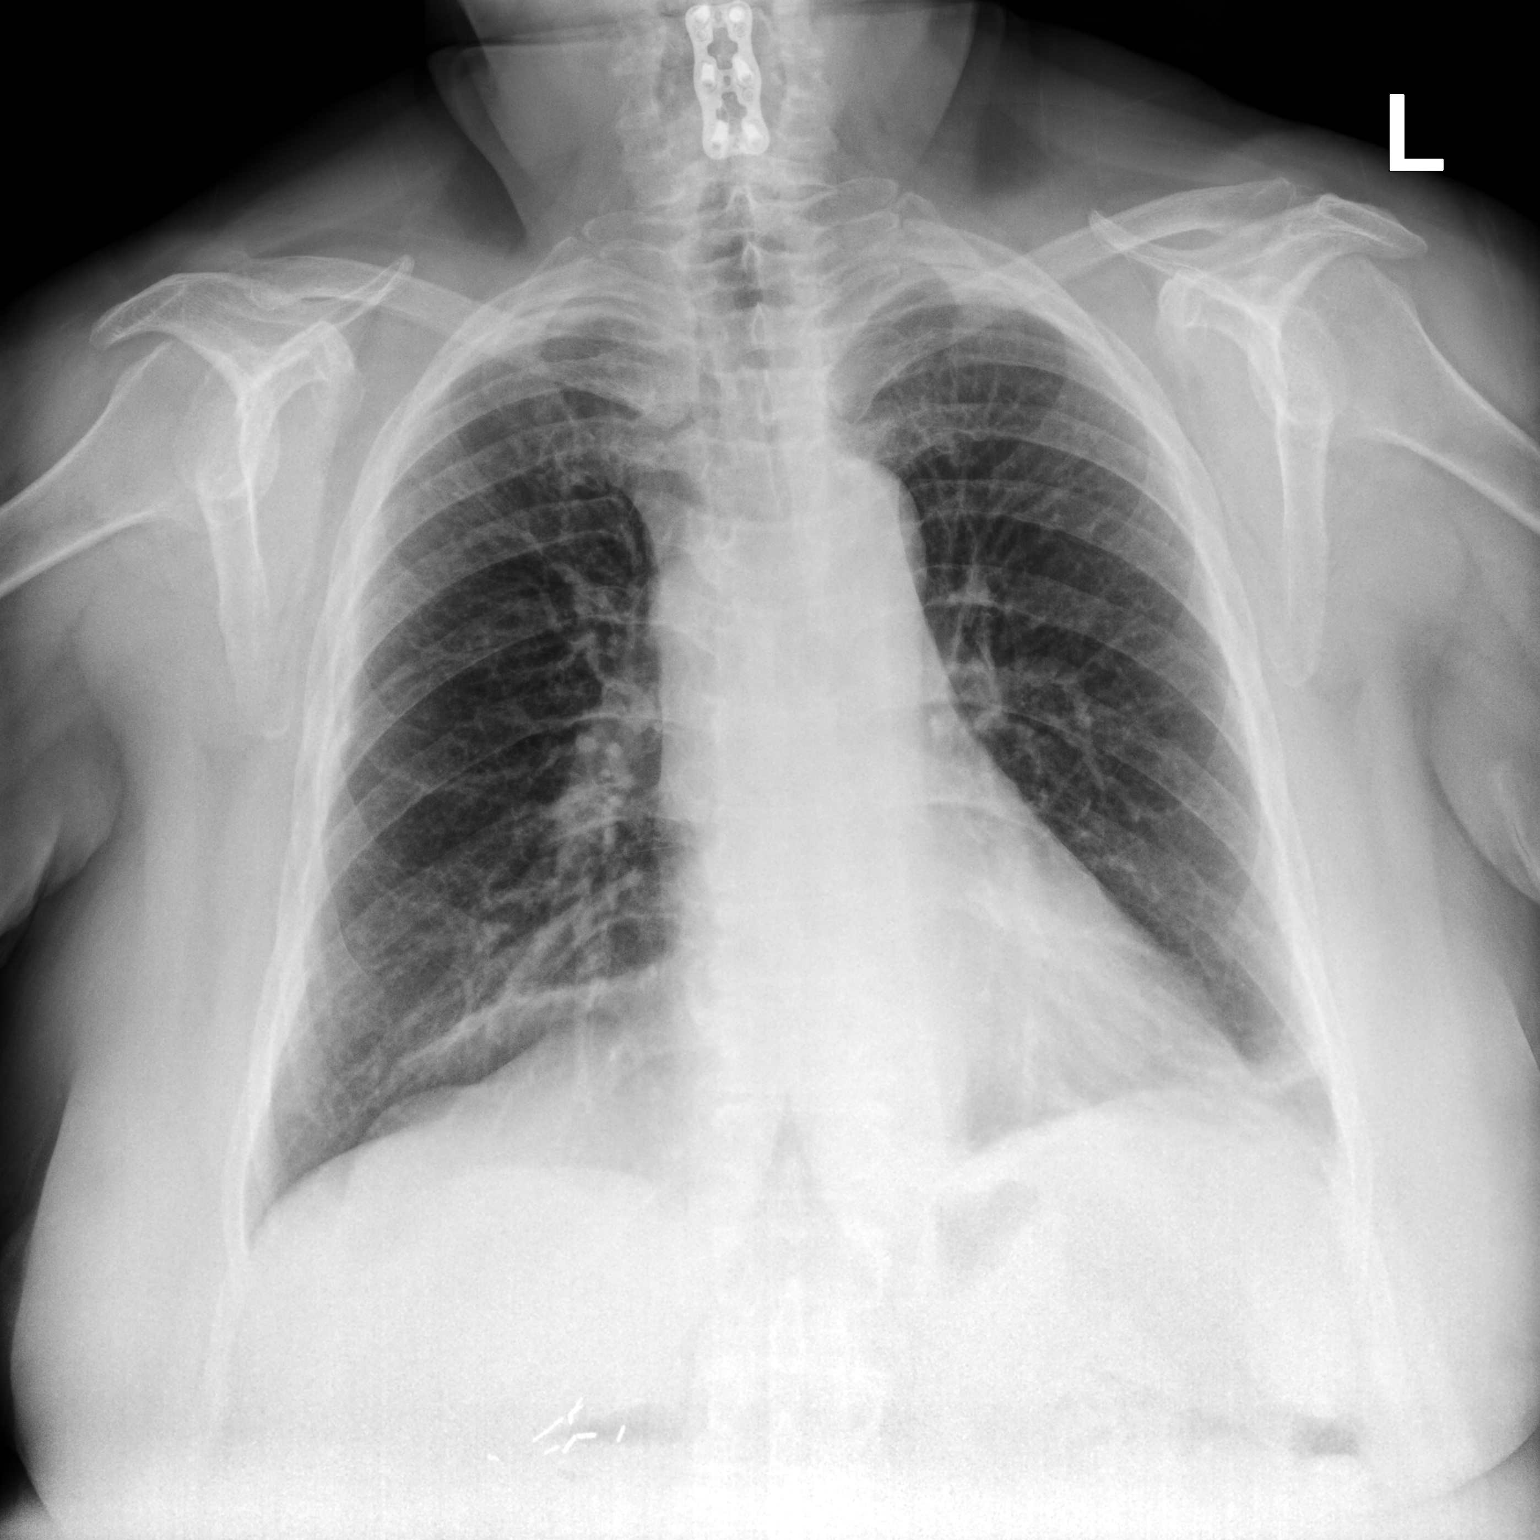

[chest lat]
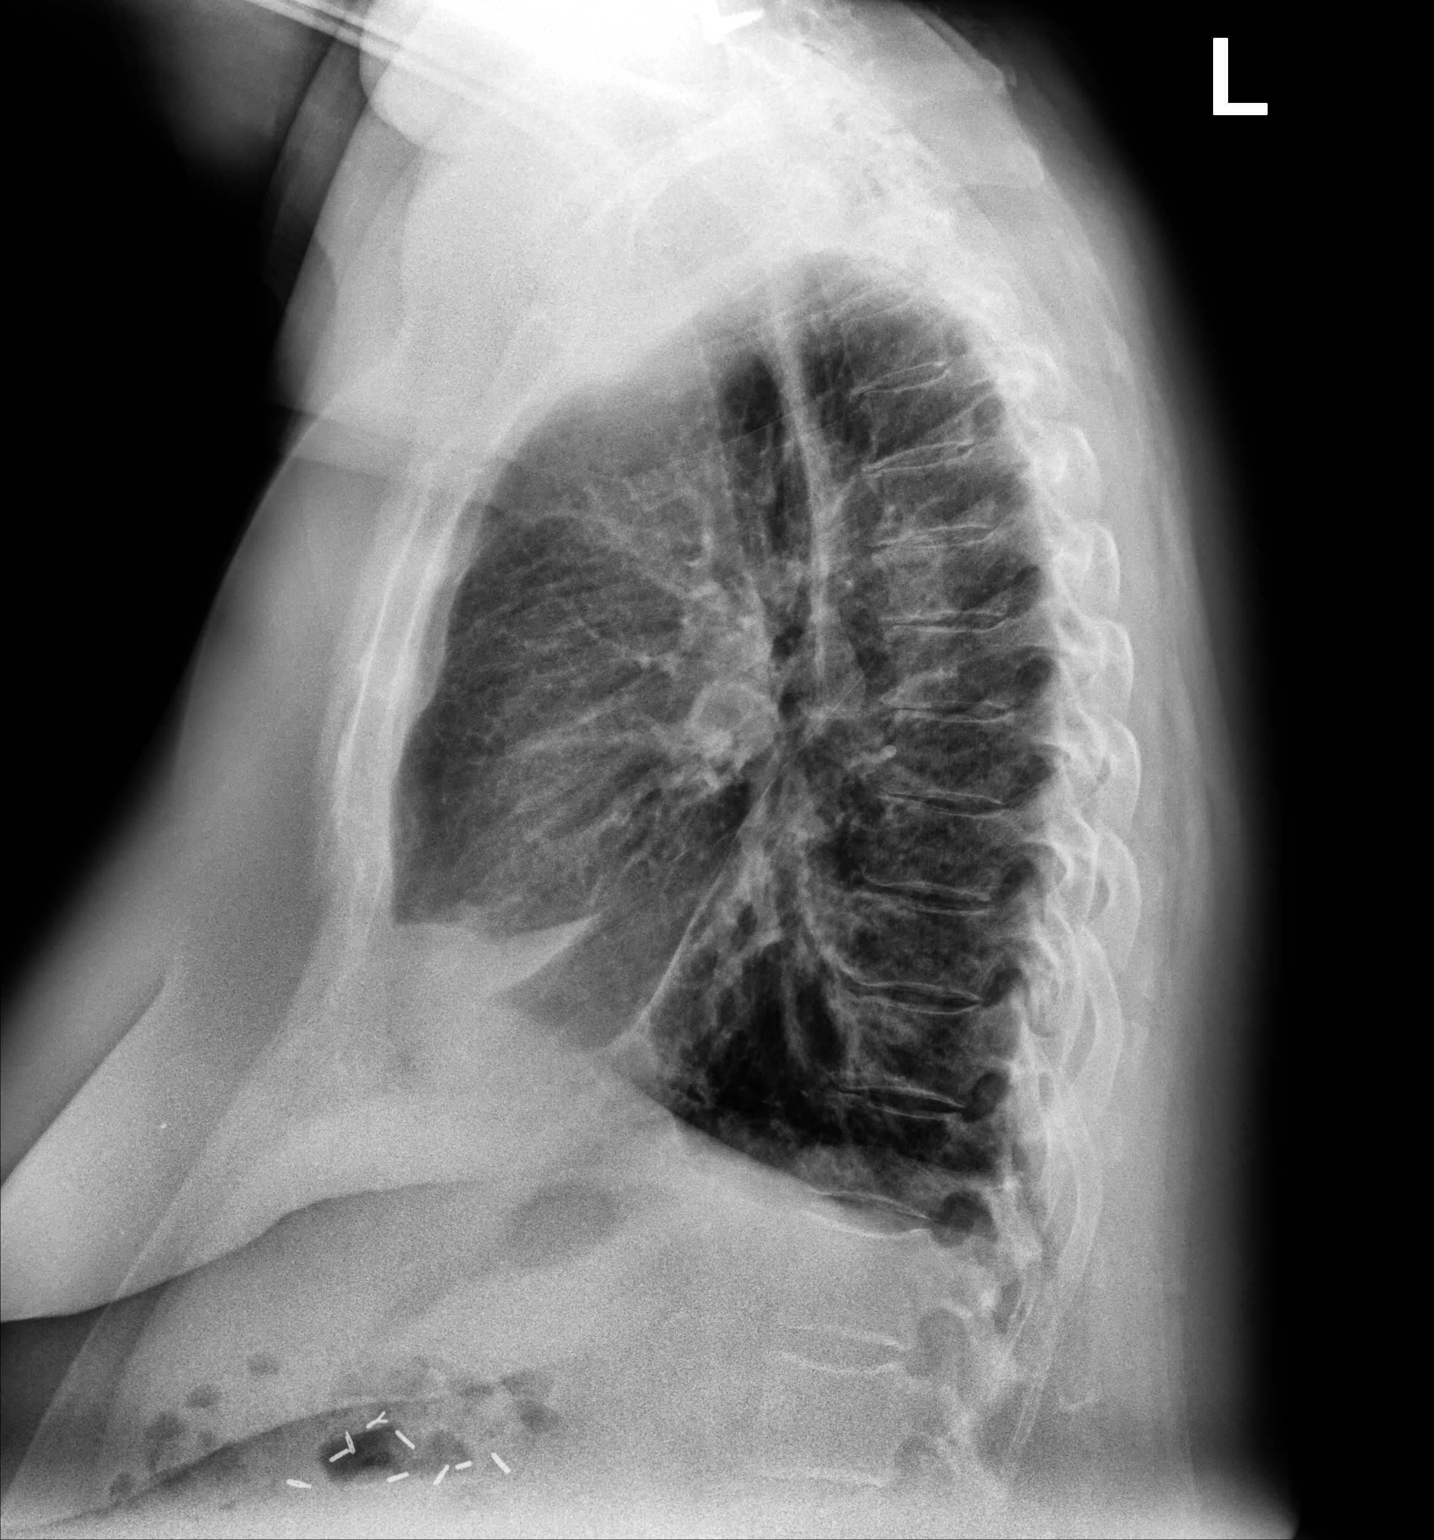

[2 of 2 positions shown; findings below may reference images not displayed]

FINDINGS: Surgical hardware from [facility] overlies the lower cervical spine.
Cholecystectomy clips are seen in the right upper quadrant of the
abdomen. Stable cardiomediastinal silhouette with normal heart size.
No pneumothorax. No pleural effusion. Hyperinflated lungs. No
pulmonary edema. Mild bibasilar linear scarring versus atelectasis.
No acute consolidative airspace disease.
IMPRESSION: 1. No acute consolidative airspace disease to suggest a pneumonia.
2. Hyperinflated lungs, compatible with the reported history of
COPD.
3. Mild bibasilar linear scarring versus atelectasis.

## 2021-07-29 ENCOUNTER — Encounter: Payer: Self-pay | Admitting: Pulmonary Disease

## 2021-07-29 ENCOUNTER — Other Ambulatory Visit: Payer: Self-pay

## 2021-07-29 ENCOUNTER — Ambulatory Visit: Payer: Medicare Other | Admitting: Pulmonary Disease

## 2021-07-29 VITALS — BP 140/90 | HR 84 | Temp 98.1°F | Ht 64.0 in | Wt 218.0 lb

## 2021-07-29 DIAGNOSIS — J4550 Severe persistent asthma, uncomplicated: Secondary | ICD-10-CM | POA: Diagnosis not present

## 2021-07-29 DIAGNOSIS — J8283 Eosinophilic asthma: Secondary | ICD-10-CM | POA: Diagnosis not present

## 2021-07-29 DIAGNOSIS — H8112 Benign paroxysmal vertigo, left ear: Secondary | ICD-10-CM | POA: Diagnosis not present

## 2021-07-29 DIAGNOSIS — R351 Nocturia: Secondary | ICD-10-CM

## 2021-07-29 DIAGNOSIS — G4733 Obstructive sleep apnea (adult) (pediatric): Secondary | ICD-10-CM

## 2021-07-29 NOTE — Progress Notes (Signed)
[facility]  Chief Complaint  Patient presents with   Follow-up    Doing well on [place].  No problems with breathing.    Past Surgical History:  She  has a past surgical history that includes Appendectomy; Tonsillectomy and adenoidectomy; AP repair and sling for cystocele (8-04 ); left hand (2010); ctr; Shoulder arthroscopy distal clavicle excision and open rotator cuff repair; Neck surgery; Abdominal hysterectomy; Cholecystectomy; carpal tunel (2008); Tubal ligation; and Eye surgery.  Past Medical History:  Vit D deficiency, Neuropathy, Low back pain, Hypothyroidism, HTN, GERD, Diverticulosis, Depression, Anxiety, DJD, COVID 19 pneumonia December 2021, Shingles  Constitutional:  BP 140/90 (BP Location: Right Arm, Patient Position: Sitting, Cuff Size: Large)   Pulse 84   Temp 98.1 F (36.7 C) (Oral)   Ht 5' 4"  (1.626 m)   Wt 218 lb (98.9 kg)   SpO2 92%   BMI 37.42 kg/m   Brief Summary:  Jenny [name] is a 80 y.o. 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      Subjective:    CT chest from Apri 2022 showed mild emphysema, atherosclerosis, and ILD.  Lung nodule resolved.  Has some sinus congestion with change of weather.  Has felt congested in her Rt ear.  She has noticed getting dizzy when she moves to the Rt.  Not having cough, wheeze, sputum, skin rash, or chest pain.  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  This will free her up to have  more flexible schedule at night.  She plans to start using CPAP again on more regular basis.  She sleeps better when using CPAP.  Physical Exam:   Appearance - well kempt   ENMT - no sinus tenderness, no oral exudate, no LAN, Mallampati 3 airway, no stridor, wears dentures, mild cerumen build up bilaterally  Respiratory - equal breath sounds bilaterally, no  wheezing or rales  CV - s1s2 regular rate and rhythm, no murmurs  Ext - no clubbing, no edema  Skin - no rashes  Psych - normal mood and affect    Pulmonary testing:  Spirometry 04/21/18 >> FEV1 1.3 (64%), FEV1% 83 IgE 01/30/20 >> 40, Absolute eosinophils 600 PFT 04/17/20 >> FEV1 1.30 (65%), FEV1% 79, TLC 6.97 (133%), RV 5.11 (207%), DLCO 88% Serology 04/17/20 >> ANA, HP panel, anti CCP negative; RF 25  Chest Imaging:  HRCT chest 02/09/20 >> atherosclerosis, 3 mm nodule LLL, mild patchy subpleural reticulation and GGO b/l with mild basilar predominance HRCT chest 02/04/21 >> centrilobular and paraseptal emphysema; mild peripheral and basilar predominant subpleural reticulation and GGO slight increase from April 2021, minimal traction BTX (probable UIP pattern)  Sleep Tests:  PSG April 2008 >> AHI 29, SpO2 low 62% Auto CPAP 11/07/19 to 12/05/18 >> used on 22 of 30 nights with average 8 hrs 40 min.  Average AHI 1.2 with median CPAP 7 and 95 th percentile CPAP 11 cm H2O.  Cardiac Tests:  Echo 03/16/17 >> EF 60 to 65%  Social History:  She  reports that she quit smoking about 29 years ago. Her smoking use included cigarettes. She has a 45.00 pack-year smoking history. She has never used smokeless tobacco. She reports that she does not drink alcohol and does not use drugs.  Family History:  Her family history includes Anxiety disorder in her daughter; Arthritis in her brother and mother; Asthma in  her daughter; COPD in her mother; Cancer in her maternal aunt, maternal aunt, and paternal grandmother; Colon polyps in her daughter; Dementia in her father; Heart disease in her maternal grandfather, maternal grandmother, and mother; Hypertension in her mother; Kidney disease in her paternal grandfather; Other in her maternal grandfather, mother, and paternal grandfather; Pneumonia in her mother; Stroke in her maternal grandmother.     Assessment/Plan:   Eosinophilic asthma,  centrilobular/paraseptal emphysema. - singulair caused nightmares - intolerant of prednisone, but can use medrol - much improved since she started dupixent in August 2021 - continue dupixent - symbicort 160 two puffs bid; is she remains stable, then could consider decreasing to symbicort 80 at next follow up - prn albuterol  Allergic rhinitis with post nasal drip. - likely has developed benign positional vertigo in setting of allergies - continue flonase, zyrtec - discussed exercises to habituate to vertigo sensation; if this persists, then advised her to follow up with her PCP  ILD. - mild changes on CT chest from April 2022 - monitor clinically and repeat imaging if she has progressive symptoms   Obstructive sleep apnea. - she is compliant with CPAP and reports benefit from therapy - uses [employer] for her DME - continue auto CPAP  Nocturia. - discussed how under-treated sleep apnea can impact this  Time Spent Involved in Patient Care on Day of Examination:  42 minutes  Follow up:   Patient Instructions  Follow up in 6 months  Medication List:   Allergies as of 07/29/2021       Reactions   Prednisone Other (See Comments)   Confusion, dizziness Medrol is ok   Codeine Nausea Only       Tramadol Itching        Medication List        Accurate as of July 29, 2021  9:37 AM. If you have any questions, ask your nurse or doctor.          STOP taking these medications    valACYclovir 1000 MG tablet Commonly known as: VALTREX Stopped by: Chesley Mires, MD       TAKE these medications    ALPRAZolam 0.5 MG tablet Commonly known as: XANAX TAKE 1/2 TO 1 TABLET BY MOUTH 3 TIMES DAILY AS NEEDED FOR ANXIETY   amLODipine 5 MG tablet Commonly known as: NORVASC TAKE 1 TABLET BY MOUTH  DAILY   benzonatate 200 MG capsule Commonly known as: TESSALON Take 1 capsule (200 mg total) by mouth 3 (three) times daily as needed for cough.   BinaxNOW COVID-19 Ag Home  Test Kit Generic drug: COVID-19 At Home Antigen Test See admin instructions.   budesonide-formoterol 160-4.5 MCG/ACT inhaler Commonly known as: SYMBICORT Inhale 2 puffs into the lungs 2 (two) times daily.   cetirizine 10 MG tablet Commonly known as: ZYRTEC Take 10 mg by mouth daily as needed.   Dupixent 300 MG/2ML Sopn Generic drug: Dupilumab Inject 300 mg into the skin every 14 (fourteen) days.   escitalopram 5 MG tablet Commonly known as: Lexapro Take 1 tablet (5 mg total) by mouth daily.   fluticasone 50 MCG/ACT nasal spray Commonly known as: FLONASE PLACE 1 SPRAY INTO BOTH NOSTRILS DAILY.   levothyroxine 112 MCG tablet Commonly known as: SYNTHROID TAKE 1 TABLET BY MOUTH EVERY DAY   pantoprazole 40 MG tablet Commonly known as: PROTONIX TAKE 1 TABLET BY MOUTH EVERY DAY 30 MINUTES BEFORE DINNER   Vitamin D (Ergocalciferol) 1.25 MG (50000 UNIT) Caps capsule Commonly known as:  DRISDOL TAKE 1 CAPSULE (50,000 UNITS TOTAL) BY MOUTH EVERY 7 (SEVEN) DAYS FOR 12 DOSES.        Signature:  Chesley Mires, MD [place] Pager - [phone] 07/29/2021, 9:37 AM

## 2021-07-29 NOTE — Patient Instructions (Signed)
Follow up in 6 months 

## 2021-08-05 ENCOUNTER — Other Ambulatory Visit: Payer: Self-pay | Admitting: Family Medicine

## 2021-08-06 DIAGNOSIS — H26491 Other secondary cataract, right eye: Secondary | ICD-10-CM | POA: Diagnosis not present

## 2021-08-06 DIAGNOSIS — H16223 Keratoconjunctivitis sicca, not specified as Sjogren's, bilateral: Secondary | ICD-10-CM | POA: Diagnosis not present

## 2021-08-09 ENCOUNTER — Other Ambulatory Visit: Payer: Self-pay | Admitting: Family

## 2021-08-20 ENCOUNTER — Ambulatory Visit (INDEPENDENT_AMBULATORY_CARE_PROVIDER_SITE_OTHER): Payer: Medicare Other

## 2021-08-20 DIAGNOSIS — F418 Other specified anxiety disorders: Secondary | ICD-10-CM

## 2021-08-20 DIAGNOSIS — I10 Essential (primary) hypertension: Secondary | ICD-10-CM

## 2021-08-20 DIAGNOSIS — J8283 Eosinophilic asthma: Secondary | ICD-10-CM

## 2021-08-20 NOTE — Patient Instructions (Addendum)
[name],  Thank you for talking with me today. I have included our care plan/goals in the following pages.   Thanks! Ellin Mayhew, PharmD, CPP Clinical Pharmacist Practitioner  [phone]  Care Plan : [place]  Updates made by [name], [facility] since 08/22/2021 12:00 AM     Problem: HTN, HLD, COPD, and Depression   Priority: High  Onset Date: 05/07/2021     Long-Range Goal: Disease Management   Start Date: 05/07/2021  Expected End Date: 08/22/2022  This Visit's Progress: On track  Recent Progress: On track  Priority: High  Note:    Current Barriers:  Financial assistance for Symbicort  Pharmacist Clinical Goal(s):  Patient will verbalize ability to afford treatment regimen contact provider office for questions/concerns as evidenced notation of same in electronic health record through collaboration with PharmD and provider.   Interventions: 1:1 collaboration with Midge Minium, MD regarding development and update of comprehensive plan of care as evidenced by provider attestation and co-signature Inter-disciplinary care team collaboration (see longitudinal plan of care) Comprehensive medication review performed; medication list updated in electronic medical record   Hypertension(BP goal <140/90) -Controlled -OSA on CPAP - no issues with compliance -Current treatment: Amlodipine 5 mg once daily -Current exercise ha -Denies hypotensive/hypertensive symptoms -Educated on BP goals and benefits of medications for prevention of heart attack, stroke and kidney damage; -Counseled to monitor BP at home 1-2x/week, document, and provide log at future appointments -Recommended to continue current medication   Depression (Goal: minimize symptoms) -Controlled Feels there is ongoing benefit with escitalopram 5 mg, but does note some ongoing symptoms of depression. Often thinks about loss of grandson, who had lived with patient. He passed away 9  months ago.  -Current treatment: Escitalopram 5 mg once daily  Xanax 0.5 mg - 1/2 to 1 tablet tid -PHQ9: 10 -Educated on Benefits of medication for symptom control -Recommended to continue current medication Agreed to increase celexa to 10 mg daily - sent to mail order pharmacy. Will double up on escitalopram 5 mg (two tabs daily) until escitalopram 10 mg comes in through mail order. CPA pt call two weeks to review tolerability. PHQ9/GAD7 next month.    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 Had visit with Dr Halford Chessman recently and plan to possible deescalate Symbicort next OV.  -Current treatment  Symbicort 160/45 2 puffs BID (PAP) Dupixent 300 mg injection every 14 days (PAP) -Exacerbations requiring treatment in last 6 months: 0 -Patient denies consistent use of maintenance inhaler -Frequency of rescue inhaler use: n/a -Counseled on Proper inhaler technique; Benefits of consistent maintenance inhaler use -Recommended to continue current medication Assessed patient finances. Will be automatically renewed for AZ&ME PAP - however pulm will need to send new erx - can send to [facility] [place] the contracted mail order pharmacy. Messaged office.    Patient Goals/Self-Care Activities Patient will: - take medications as prescribed collaborate with provider on medication access solutions       The patient verbalized understanding of instructions provided today and agreed to receive a MyChart copy of patient instruction and/or educational materials. Telephone follow up appointment with pharmacy team member scheduled for: See next appointment with "Care Management Staff" under "What's Next" below.    Hypertension, Adult High blood pressure (hypertension) is when the force of blood pumping through the arteries is too strong. The arteries are the blood vessels  that carry  blood from the heart throughout the body. Hypertension forces the heart to work harder to pump blood and may cause arteries to become narrow or stiff. Untreated or uncontrolled hypertension can cause a heart attack, heart failure, a stroke, kidney disease, and other problems. A blood pressure reading consists of a higher number over a lower number. Ideally, your blood pressure should be below 120/80. The first ("top") number is called the systolic pressure. It is a measure of the pressure in your arteries as your heart beats. The second ("bottom") number is called the diastolic pressure. It is a measure of the pressure in your arteries as the heart relaxes. What are the causes? The exact cause of this condition is not known. There are some conditions that result in or are related to high blood pressure. What increases the risk? Some risk factors for high blood pressure are under your control. The following factors may make you more likely to develop this condition: Smoking. Having type 2 diabetes mellitus, high cholesterol, or both. Not getting enough exercise or physical activity. Being overweight. Having too much fat, sugar, calories, or salt (sodium) in your diet. Drinking too much alcohol. Some risk factors for high blood pressure may be difficult or impossible to change. Some of these factors include: Having chronic kidney disease. Having a family history of high blood pressure. Age. Risk increases with age. Race. You may be at higher risk if you are African American. Gender. Men are at higher risk than women before age 49. After age 94, women are at higher risk than men. Having obstructive sleep apnea. Stress. What are the signs or symptoms? High blood pressure may not cause symptoms. Very high blood pressure (hypertensive crisis) may cause: Headache. Anxiety. Shortness of breath. Nosebleed. Nausea and vomiting. Vision changes. Severe chest pain. Seizures. How is this  diagnosed? This condition is diagnosed by measuring your blood pressure while you are seated, with your arm resting on a flat surface, your legs uncrossed, and your feet flat on the floor. The cuff of the blood pressure monitor will be placed directly against the skin of your upper arm at the level of your heart. It should be measured at least twice using the same arm. Certain conditions can cause a difference in blood pressure between your right and left arms. Certain factors can cause blood pressure readings to be lower or higher than normal for a short period of time: When your blood pressure is higher when you are in a health care provider's office than when you are at home, this is called white coat hypertension. Most people with this condition do not need medicines. When your blood pressure is higher at home than when you are in a health care provider's office, this is called masked hypertension. Most people with this condition may need medicines to control blood pressure. If you have a high blood pressure reading during one visit or you have normal blood pressure with other risk factors, you may be asked to: Return on a different day to have your blood pressure checked again. Monitor your blood pressure at home for 1 week or longer. If you are diagnosed with hypertension, you may have other blood or imaging tests to help your health care provider understand your overall risk for other conditions. How is this treated? This condition is treated by making healthy lifestyle changes, such as eating healthy foods, exercising more, and reducing your alcohol intake. Your health care provider may prescribe medicine if lifestyle  changes are not enough to get your blood pressure under control, and if: Your systolic blood pressure is above 130. Your diastolic blood pressure is above 80. Your personal target blood pressure may vary depending on your medical conditions, your age, and other factors. Follow  these instructions at home: Eating and drinking  Eat a diet that is high in fiber and potassium, and low in sodium, added sugar, and fat. An example eating plan is called the DASH (Dietary Approaches to Stop Hypertension) diet. To eat this way: Eat plenty of fresh fruits and vegetables. Try to fill one half of your plate at each meal with fruits and vegetables. Eat whole grains, such as whole-wheat pasta, brown rice, or whole-grain bread. Fill about one fourth of your plate with whole grains. Eat or drink low-fat dairy products, such as skim milk or low-fat yogurt. Avoid fatty cuts of meat, processed or cured meats, and poultry with skin. Fill about one fourth of your plate with lean proteins, such as fish, chicken without skin, beans, eggs, or tofu. Avoid pre-made and processed foods. These tend to be higher in sodium, added sugar, and fat. Reduce your daily sodium intake. Most people with hypertension should eat less than 1,500 mg of sodium a day. Do not drink alcohol if: Your health care provider tells you not to drink. You are pregnant, may be pregnant, or are planning to become pregnant. If you drink alcohol: Limit how much you use to: 0-1 drink a day for women. 0-2 drinks a day for men. Be aware of how much alcohol is in your drink. In the [place] one drink equals one 12 oz bottle of beer (355 mL), one 5 oz glass of wine (148 mL), or one 1 oz glass of hard liquor (44 mL). Lifestyle  Work with your health care provider to maintain a healthy body weight or to lose weight. Ask what an ideal weight is for you. Get at least 30 minutes of exercise most days of the week. Activities may include walking, swimming, or biking. Include exercise to strengthen your muscles (resistance exercise), such as Pilates or lifting weights, as part of your weekly exercise routine. Try to do these types of exercises for 30 minutes at least 3 days a week. Do not use any products that contain nicotine or tobacco,  such as cigarettes, e-cigarettes, and chewing tobacco. If you need help quitting, ask your health care provider. Monitor your blood pressure at home as told by your health care provider. Keep all follow-up visits as told by your health care provider. This is important. Medicines Take over-the-counter and prescription medicines only as told by your health care provider. Follow directions carefully. Blood pressure medicines must be taken as prescribed. Do not skip doses of blood pressure medicine. Doing this puts you at risk for problems and can make the medicine less effective. Ask your health care provider about side effects or reactions to medicines that you should watch for. Contact a health care provider if you: Think you are having a reaction to a medicine you are taking. Have headaches that keep coming back (recurring). Feel dizzy. Have swelling in your ankles. Have trouble with your vision. Get help right away if you: Develop a severe headache or confusion. Have unusual weakness or numbness. Feel faint. Have severe pain in your chest or abdomen. Vomit repeatedly. Have trouble breathing. Summary Hypertension is when the force of blood pumping through your arteries is too strong. If this condition is not controlled, it may  put you at risk for serious complications. Your personal target blood pressure may vary depending on your medical conditions, your age, and other factors. For most people, a normal blood pressure is less than 120/80. Hypertension is treated with lifestyle changes, medicines, or a combination of both. Lifestyle changes include losing weight, eating a healthy, low-sodium diet, exercising more, and limiting alcohol. This information is not intended to replace advice given to you by your health care provider. Make sure you discuss any questions you have with your health care provider. Document Revised: 06/08/2018 Document Reviewed: 06/08/2018 Elsevier Patient Education   [place].

## 2021-08-20 NOTE — Progress Notes (Signed)
Chronic Care Management Pharmacy Note  08/20/2021 Name:  Jenny [name] MRN:  [id number] DOB:  07/13/1941  Recommendations/Changes made from today's visit: Escitalopram 10 mg sent to mail order pharmacy  Subjective: Jenny [name] is an 80 y.o. year old female who is a primary patient of Tabori, [name], MD.  The CCM team was consulted for assistance with disease management and care coordination needs.    Engaged with patient by telephone for follow up visit in response to provider referral for pharmacy case management and/or care coordination services.   Consent to Services:  The patient was given information about Chronic Care Management services, agreed to services, and gave verbal consent prior to initiation of services.  Please see initial visit note for detailed documentation.   Patient Care Team: Midge Minium, MD as PCP - General (Family Medicine) Noralee Space, MD as Consulting Physician (Pulmonary Disease) Ladene Artist, MD as Consulting Physician (Gastroenterology) Garrel Ridgel, DPM as Consulting Physician (Podiatry) Dian Queen, MD as Consulting Physician (Obstetrics and Gynecology) [name], [facility] as Pharmacist (Pharmacist)  Objective:  Lab Results  Component Value Date   CREATININE 0.91 04/09/2021   CREATININE 0.96 10/04/2020   CREATININE 0.83 10/03/2020    Lab Results  Component Value Date   HGBA1C 6.2 (H) 09/30/2020   Last diabetic Eye exam: No results found for: HMDIABEYEEXA  Last diabetic Foot exam: No results found for: HMDIABFOOTEX      Component Value Date/Time   CHOL 131 04/09/2021 1407   TRIG 127.0 04/09/2021 1407   HDL 37.90 (L) 04/09/2021 1407   CHOLHDL 3 04/09/2021 1407   VLDL 25.4 04/09/2021 1407   [place] 68 04/09/2021 1407    Hepatic Function Latest Ref Rng & Units 04/09/2021 10/04/2020 10/03/2020  Total Protein 6.0 - 8.3 g/dL 6.4 6.2(L) 6.7  Albumin 3.5 - 5.2 g/dL 4.0 3.3(L) 3.6  AST 0 - 37 U/L 14 31 31    ALT 0 - 35 U/L 9 37 26  Alk Phosphatase 39 - 117 U/L 83 73 70  Total Bilirubin 0.2 - 1.2 mg/dL 0.7 0.4 0.5  Bilirubin, Direct 0.0 - 0.3 mg/dL 0.2 - -    Lab Results  Component Value Date/Time   TSH 1.60 04/09/2021 02:07 PM   TSH 3.66 12/05/2019 09:43 AM   FREET4 1.45 08/03/2013 10:56 AM   FREET4 1.05 02/08/2013 09:39 AM    CBC Latest Ref Rng & Units 04/09/2021 10/04/2020 10/03/2020  WBC 4.0 - 10.5 K/uL 9.8 7.6 9.1  Hemoglobin 12.0 - 15.0 g/dL 13.4 14.3 14.0  Hematocrit 36.0 - 46.0 % 40.3 43.9 44.6  Platelets 150.0 - 400.0 K/uL 270.0 226 263    Lab Results  Component Value Date/Time   VD25OH 24.64 (L) 04/09/2021 02:07 PM   VD25OH 17.35 (L) 11/09/2019 09:31 AM   Clinical ASCVD:  The ASCVD Risk score (Arnett DK, et al., 2019) failed to calculate for the following reasons:   The 2019 ASCVD risk score is only valid for ages 53 to 72    Social History   Tobacco Use  Smoking Status Former   Packs/day: 1.50   Years: 30.00   Pack years: 45.00   Types: Cigarettes   Quit date: 10/13/1991   Years since quitting: 29.8  Smokeless Tobacco Never   BP Readings from Last 3 Encounters:  07/29/21 140/90  04/09/21 121/80  02/28/21 (!) 176/93   Pulse Readings from Last 3 Encounters:  07/29/21 84  04/09/21 71  02/28/21 75  Wt Readings from Last 3 Encounters:  07/29/21 218 lb (98.9 kg)  04/09/21 215 lb 3.2 oz (97.6 kg)  01/27/21 212 lb 6.4 oz (96.3 kg)    Assessment: Review of patient past medical history, allergies, medications, health status, including review of consultants reports, laboratory and other test data, was performed as part of comprehensive evaluation and provision of chronic care management services.   SDOH:  (Social Determinants of Health) assessments and interventions performed: Yes   CCM Care Plan  Allergies  Allergen Reactions   Prednisone Other (See Comments)    Confusion, dizziness Medrol is ok   Codeine Nausea Only        Tramadol Itching     Medications Reviewed Today     Reviewed by Chesley Mires, MD (Physician) on 07/29/21 at 0917  Med List Status: <None>   Medication Order Taking? Sig Documenting Provider Last Dose Status Informant  ALPRAZolam (XANAX) 0.5 MG tablet [id number]  TAKE 1/2 TO 1 TABLET BY MOUTH 3 TIMES DAILY AS NEEDED FOR ANXIETY Dutch Quint B, FNP  Active   amLODipine (NORVASC) 5 MG tablet [id number]  TAKE 1 TABLET BY MOUTH  DAILY Midge Minium, MD  Active   benzonatate (TESSALON) 200 MG capsule [id number]  Take 1 capsule (200 mg total) by mouth 3 (three) times daily as needed for cough. Lavina Hamman, MD  Active   BINAXNOW COVID-19 AG HOME TEST KIT [id number]  See admin instructions. [provider]  Active   budesonide-formoterol [facility]) 160-4.5 MCG/ACT inhaler [id number]  Inhale 2 puffs into the lungs 2 (two) times daily. Parrett, Fonnie Mu, NP  Active Self  cetirizine (ZYRTEC) 10 MG tablet [id number]  Take 10 mg by mouth daily as needed. [provider]  Active Self  Dupilumab ([place]) 300 MG/2ML SOPN [id number]  Inject 300 mg into the skin every 14 (fourteen) days. Chesley Mires, MD  Active   escitalopram (LEXAPRO) 5 MG tablet [id number]  Take 1 tablet (5 mg total) by mouth daily. Midge Minium, MD  Active     Discontinued 12/18/11 [phone] (Error)            Med Note Domingo Cocking, CHRISTY L   Fri Dec 18, 2011  8:42 AM) Dr Marlyce Huge put pt on Zantac  fluticasone (FLONASE) 50 MCG/ACT nasal spray [id number]  PLACE 1 SPRAY INTO BOTH NOSTRILS DAILY. Chesley Mires, MD  Active Self           Med Note Maud Deed   Mon Sep 30, 2020  3:55 PM)    levothyroxine (SYNTHROID) 112 MCG tablet [id number]  TAKE 1 TABLET BY MOUTH EVERY DAY Midge Minium, MD  Active   pantoprazole (PROTONIX) 40 MG tablet [id number]  TAKE 1 TABLET BY MOUTH EVERY DAY 30 MINUTES BEFORE Audie Pinto, [place], MD  Active   Vitamin D, Ergocalciferol, (DRISDOL) 1.25 MG (50000 UNIT) CAPS capsule [id number]  TAKE 1 CAPSULE  (50,000 UNITS TOTAL) BY MOUTH EVERY 7 (SEVEN) DAYS FOR 12 DOSES. 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   Hyperglycemia 07/07/2015   Annual physical exam 08/06/2013   Atrophic vaginitis 06/17/2012   Peripheral vascular  disease, unspecified ([place]) 06/02/2012   Urticaria 10/15/2011   Anemia 09/21/2011   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   Vitamin D deficiency 02/12/2011   Peripheral neuropathy 02/12/2011   Intertrigo 93/26/7124   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   History of chicken pox 01/14/2011   History of measles 01/14/2011   OTHER URINARY INCONTINENCE 03/12/2010   Depression with anxiety 03/06/2008   Essential hypertension 02/22/2008   Chronic bronchitis ([place]) 02/22/2008   DIVERTICULOSIS OF COLON 02/22/2008   Unilateral primary osteoarthritis, left knee 02/22/2008   Hypothyroidism 08/23/2007   Morbid obesity ([place]) 08/23/2007   OSA on CPAP 08/23/2007   Venous (peripheral) insufficiency 08/23/2007   Allergic rhinitis 08/23/2007   GERD without esophagitis 08/23/2007   BACK PAIN, LUMBAR 08/23/2007    Immunization History  Administered Date(s) Administered   Fluad Quad(high Dose 65+) 08/02/2019, 08/05/2020, 07/05/2021   Influenza Split 08/20/2011, 09/30/2012   Influenza Whole 08/13/2013   Influenza, High Dose Seasonal PF 10/13/2014, 09/03/2015, 06/03/2017, 09/25/2018   Influenza-Unspecified 07/11/2016   Moderna Sars-Covid-2 Vaccination 11/17/2019, 12/24/2019   Pneumococcal Conjugate-13 03/16/2014   Pneumococcal Polysaccharide-23 06/23/2016   Tdap 09/22/2011   Zoster Recombinat (Shingrix) 03/16/2014    Zoster, Live 03/16/2014    Conditions to be addressed/monitored: HTN, HLD, COPD, and Depression  Care Plan : [place]  Updates made by [name], [facility] since 08/22/2021 12:00 AM     Problem: HTN, HLD, COPD, and Depression   Priority: High  Onset Date: 05/07/2021     Long-Range Goal: Disease Management   Start Date: 05/07/2021  Recent Progress: On track  Priority: High  Note:    Current Barriers:  Financial assistance for Symbicort  Pharmacist Clinical Goal(s):  Patient will verbalize ability to afford treatment regimen contact provider office for questions/concerns as evidenced notation of same in electronic health record through collaboration with PharmD and provider.   Interventions: 1:1 collaboration with Midge Minium, MD regarding development and update of comprehensive plan of care as evidenced by provider attestation and co-signature Inter-disciplinary care team collaboration (see longitudinal plan of care) Comprehensive medication review performed; medication list updated in electronic medical record   Hypertension(BP goal <140/90) -Controlled -OSA on CPAP - no issues with compliance -Current treatment: Amlodipine 5 mg once daily -Current exercise ha -Denies hypotensive/hypertensive symptoms -Educated on BP goals and benefits of medications for prevention of heart attack, stroke and kidney damage; -Counseled to monitor BP at home 1-2x/week, document, and provide log at future appointments -Recommended to continue current medication   Depression (Goal: minimize symptoms) -Controlled Feels there is ongoing benefit with escitalopram 5 mg, but does note some ongoing symptoms of depression. Often thinks about loss of grandson, who had lived with patient. He passed away 9 months ago.  -Current treatment: Escitalopram 5 mg once daily  Xanax 0.5 mg - 1/2 to 1 tablet tid -PHQ9: 10 -Educated on Benefits of medication for symptom  control -Recommended to continue current medication Agreed to increase celexa to 10 mg daily - sent to mail order pharmacy. Will double up on escitalopram 5 mg (two tabs daily) until escitalopram 10 mg comes in through mail order. CPA pt call two weeks to review tolerability. PHQ9/GAD7 next month.    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 Had visit with Dr Halford Chessman recently and plan to possible deescalate Symbicort next OV.  -Current treatment  Symbicort 160/45 2 puffs  BID (PAP) Dupixent 300 mg injection every 14 days (PAP) -Exacerbations requiring treatment in last 6 months: 0 -Patient denies consistent use of maintenance inhaler -Frequency of rescue inhaler use: n/a -Counseled on Proper inhaler technique; Benefits of consistent maintenance inhaler use -Recommended to continue current medication Assessed patient finances. Will be automatically renewed for [facility] PAP - however pulm will need to send new erx - can send to [facility] [place] the contracted mail order pharmacy. Will contact office.      Patient Goals/Self-Care Activities Patient will: - take medications as prescribed collaborate with provider on medication access solutions       Medication Assistance: None required.  Patient affirms current coverage meets needs.  Patient's preferred pharmacy is:  [employer] ([place] - [place], [place] [facility] [place] [address] [address] Phone: [phone] Fax: [place], [place] [address] [place] [address] [place] [address] Phone: [phone] Fax: [phone]  [id number] [place], [place] - [address] [place] [address]Phone: [phone]: [phone] [facility] - [place] [address]Phone: [phone]: [phone] Follow Up:  Patient agrees to Care Plan and Follow-up.  Plan:  3 month [facility] f/u . 1 month patient call (ensure financial needs are met)  Future Appointments  Date Time Provider [place]even [place] 08/20/2021  4:00 PM [facility] CCM PHARMACIST [facility]    JMadelin Rear PharmD, CPP Clinical Pharmacist Practitioner  [facility]  [phone]

## 2021-08-22 ENCOUNTER — Other Ambulatory Visit: Payer: Self-pay | Admitting: Pulmonary Disease

## 2021-08-22 MED ORDER — BUDESONIDE-FORMOTEROL FUMARATE 160-4.5 MCG/ACT IN AERO: 2.0000 | INHALATION_SPRAY | Freq: Two times a day (BID) | RESPIRATORY_TRACT | 3 refills | Status: DC

## 2021-08-22 MED ORDER — ESCITALOPRAM OXALATE 10 MG PO TABS: 10.0000 mg | ORAL_TABLET | Freq: Every day | ORAL | 1 refills | Status: DC

## 2021-09-08 ENCOUNTER — Telehealth: Payer: Self-pay | Admitting: Pulmonary Disease

## 2021-09-08 NOTE — Telephone Encounter (Signed)
Spoke with the pt and scheduled appt with TP for tomorrow at 10:30 am.

## 2021-09-08 NOTE — Telephone Encounter (Signed)
Called and spoke with patient. She stated that she developed a non-productive cough last Thursday. The cough did become productive over the weekend but the phlegm has been clear. She has also noticed an increase in wheezing. She stated that she has not wheezed in months since starting the [place] and she is concerned. She denied any fevers or body aches. She has not been around anyone who has been sick recently.   She used her Symbicort 160 inhaler and she did have some relief from the wheezing. She also took a few left over medrol dosepak. She was unsure of the milligrams.   She is wanting to know if [facility] would send in something to help with the wheezing.   [facility] [place]   [facility], can you please advise? Thanks!

## 2021-09-08 NOTE — Telephone Encounter (Signed)
She really should come in for an ROV with one of the NPs to further assess.

## 2021-09-09 ENCOUNTER — Other Ambulatory Visit: Payer: Self-pay

## 2021-09-09 ENCOUNTER — Encounter: Payer: Self-pay | Admitting: Adult Health

## 2021-09-09 ENCOUNTER — Ambulatory Visit: Payer: Medicare Other | Admitting: Adult Health

## 2021-09-09 ENCOUNTER — Other Ambulatory Visit: Payer: Self-pay | Admitting: *Deleted

## 2021-09-09 ENCOUNTER — Telehealth: Payer: Self-pay | Admitting: *Deleted

## 2021-09-09 VITALS — BP 130/80 | HR 75 | Temp 98.2°F | Ht 64.0 in | Wt 213.8 lb

## 2021-09-09 DIAGNOSIS — R051 Acute cough: Secondary | ICD-10-CM | POA: Diagnosis not present

## 2021-09-09 DIAGNOSIS — J45909 Unspecified asthma, uncomplicated: Secondary | ICD-10-CM | POA: Diagnosis not present

## 2021-09-09 LAB — POC COVID19 BINAXNOW: SARS Coronavirus 2 Ag: NEGATIVE

## 2021-09-09 LAB — POCT INFLUENZA A/B
Influenza A, POC: NEGATIVE
Influenza B, POC: NEGATIVE

## 2021-09-09 MED ORDER — BENZONATATE 200 MG PO CAPS: 200.0000 mg | ORAL_CAPSULE | Freq: Three times a day (TID) | ORAL | 0 refills | Status: DC | PRN

## 2021-09-09 MED ORDER — BUDESONIDE-FORMOTEROL FUMARATE 160-4.5 MCG/ACT IN AERO: 2.0000 | INHALATION_SPRAY | Freq: Two times a day (BID) | RESPIRATORY_TRACT | 3 refills | Status: DC

## 2021-09-09 MED ORDER — AZITHROMYCIN 250 MG PO TABS: ORAL_TABLET | ORAL | 0 refills | Status: AC

## 2021-09-09 MED ORDER — METHYLPREDNISOLONE 4 MG PO TBPK: ORAL_TABLET | ORAL | 0 refills | Status: DC

## 2021-09-09 NOTE — Progress Notes (Signed)
@Patient  ID: Jenny [name], female    DOB: [birth year], 80 y.o.   MRN: [id number]  Chief Complaint  Patient presents with   Acute Visit    Referring provider: Midge Minium, MD  HPI: 80 year old female followed for allergic eosinophilic asthma, allergic rhinitis, ILD, obstructive sleep apnea  TEST/EVENTS :  Spirometry 04/21/18 >> FEV1 1.3 (64%), FEV1% 83 IgE 01/30/20 >> 40, Absolute eosinophils 600 PFT 04/17/20 >> FEV1 1.30 (65%), FEV1% 79, TLC 6.97 (133%), RV 5.11 (207%), DLCO 88% Serology 04/17/20 >> ANA, HP panel, anti CCP negative; RF 25   Chest Imaging:  HRCT chest 02/09/20 >> atherosclerosis, 3 mm nodule LLL, mild patchy subpleural reticulation and GGO b/l with mild basilar predominance HRCT chest 02/04/21 >> centrilobular and paraseptal emphysema; mild peripheral and basilar predominant subpleural reticulation and GGO slight increase from April 2021, minimal traction BTX (probable UIP pattern)   Sleep Tests:  PSG April 2008 >> AHI 29, SpO2 low 62% Auto CPAP 11/07/19 to 12/05/18 >> used on 22 of 30 nights with average 8 hrs 40 min.  Average AHI 1.2 with median CPAP 7 and 95 th percentile CPAP 11 cm H2O.   Cardiac Tests:  Echo 03/16/17 >> EF 60 to 65%  09/09/2021 Acute OV : Asthma  Patient presents for an acute office visit.  Patient complains over the last 5 days she has had nasal congestion drainage productive cough with thick mucus, intermittent wheezing.  She has been taken over-the-counter products without much relief.  Patient says her asthma has been under excellent control on her current regimen with Symbicort and Dupixent.  She is not had a flare in a long time.  Patient says she was exposed to someone that was sick over the holidays.  And within a day she started having similar symptoms.  Patient denies any hemoptysis chest pain orthopnea PND or increased leg swelling.  Appetite is good with no nausea vomiting or diarrhea.  She denies any fever or body  aches.  Allergies  Allergen Reactions   Prednisone Other (See Comments)    Confusion, dizziness Medrol is ok   Codeine Nausea Only        Tramadol Itching    Immunization History  Administered Date(s) Administered   Fluad Quad(high Dose 65+) 08/02/2019, 08/05/2020, 07/05/2021   Influenza Split 08/20/2011, 09/30/2012   Influenza Whole 08/13/2013   Influenza, High Dose Seasonal PF 10/13/2014, 09/03/2015, 06/03/2017, 09/25/2018   Influenza-Unspecified 07/11/2016   Moderna Sars-Covid-2 Vaccination 11/17/2019, 12/24/2019   Pneumococcal Conjugate-13 03/16/2014   Pneumococcal Polysaccharide-23 06/23/2016   Tdap 09/22/2011   Zoster Recombinat (Shingrix) 03/16/2014   Zoster, Live 03/16/2014    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   Anemia 09/21/2011   Asthma 01/14/2011   Atrophic vaginitis 06/17/2012   BACK PAIN, LUMBAR 08/23/2007   DEGENERATIVE JOINT DISEASE 02/22/2008   Depression with anxiety 03/06/2008   Qualifier: Diagnosis of  By: Lenna Gilford MD, Scott M    DIVERTICULOSIS OF COLON 02/22/2008   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   elevated glucose 08/21/2008   GERD (gastroesophageal reflux disease)    History of chicken pox 01/14/2011   History of measles 01/14/2011   HYPERTENSION 02/22/2008   HYPOTHYROIDISM 08/23/2007   Incontinence of urine    Interstitial lung disease ([place])    OBESITY 08/23/2007   Other urinary incontinence 03/12/2010   Peripheral neuropathy 02/12/2011   possibly related to spondylolisthesis per pt   Pneumonia due to COVID-19  virus 09/2020   PONV (postoperative nausea and vomiting)    Pulmonary emphysema ([facility])    Sleep apnea    Urinary frequency    UTI (lower urinary tract infection) 03/23/2012   VENOUS INSUFFICIENCY 08/23/2007   VITAMIN D DEFICIENCY 08/21/2008    Tobacco History: Social History   Tobacco Use  Smoking Status Former   Packs/day: 1.50   Years: 30.00   Pack years: 45.00    Types: Cigarettes   Quit date: 10/13/1991   Years since quitting: 29.9  Smokeless Tobacco Never   Counseling given: Not Answered   Outpatient Medications Prior to Visit  Medication Sig Dispense Refill   albuterol (PROVENTIL) (2.5 MG/3ML) 0.083% nebulizer solution Take 3 mLs (2.5 mg total) by nebulization every 6 (six) hours as needed for wheezing or shortness of breath.     albuterol (VENTOLIN HFA) 108 (90 Base) MCG/ACT inhaler Inhale 1-2 puffs into the lungs every 6 (six) hours as needed.     ALPRAZolam (XANAX) 0.5 MG tablet TAKE 1/2 TO 1 TABLET BY MOUTH 3 TIMES DAILY AS NEEDED FOR ANXIETY 90 tablet 0   amLODipine (NORVASC) 5 MG tablet TAKE 1 TABLET BY MOUTH  DAILY 90 tablet 3   BINAXNOW COVID-19 AG HOME TEST KIT See admin instructions.     budesonide-formoterol (SYMBICORT) 160-4.5 MCG/ACT inhaler Inhale 2 puffs into the lungs 2 (two) times daily. 30.6 g 3   cetirizine (ZYRTEC) 10 MG tablet Take 10 mg by mouth daily as needed.     Dupilumab (DUPIXENT) 300 MG/2ML SOPN Inject 300 mg into the skin every 14 (fourteen) days. 12 mL 0   escitalopram (LEXAPRO) 10 MG tablet Take 1 tablet (10 mg total) by mouth daily. 90 tablet 1   fluticasone (FLONASE) 50 MCG/ACT nasal spray PLACE 1 SPRAY INTO BOTH NOSTRILS DAILY. 48 mL 1   levothyroxine (SYNTHROID) 112 MCG tablet TAKE 1 TABLET BY MOUTH EVERY DAY 90 tablet 1   pantoprazole (PROTONIX) 40 MG tablet TAKE 1 TABLET BY MOUTH EVERY DAY 30 MINUTES BEFORE DINNER 90 tablet 3   Vitamin D, Ergocalciferol, (DRISDOL) 1.25 MG (50000 UNIT) CAPS capsule TAKE 1 CAPSULE (50,000 UNITS TOTAL) BY MOUTH EVERY 7 (SEVEN) DAYS FOR 12 DOSES. 12 capsule 0   benzonatate (TESSALON) 200 MG capsule Take 1 capsule (200 mg total) by mouth 3 (three) times daily as needed for cough. 45 capsule 0   No facility-administered medications prior to visit.     Review of Systems:   Constitutional:   No  weight loss, night sweats,  Fevers, chills, fatigue, or  lassitude.  HEENT:   No  headaches,  Difficulty swallowing,  Tooth/dental problems, or  Sore throat,                No sneezing, itching, ear ache,  +nasal congestion, post nasal drip,   CV:  No chest pain,  Orthopnea, PND, swelling in lower extremities, anasarca, dizziness, palpitations, syncope.   GI  No heartburn, indigestion, abdominal pain, nausea, vomiting, diarrhea, change in bowel habits, loss of appetite, bloody stools.   Resp:  No chest wall deformity  Skin: no rash or lesions.  GU: no dysuria, change in color of urine, no urgency or frequency.  No flank pain, no hematuria   MS:  No joint pain or swelling.  No decreased range of motion.  No back pain.    Physical Exam  BP 130/80 (BP Location: Left Arm, Patient Position: Sitting, Cuff Size: Large)   Pulse 75   Temp  98.2 F (36.8 C) (Oral)   Ht 5' 4"  (1.626 m)   Wt 213 lb 12.8 oz (97 kg)   SpO2 95%   BMI 36.70 kg/m   GEN: A/Ox3; pleasant , NAD, well nourished    HEENT:  [place]/AT,  NOSE-clear, THROAT-clear, no lesions, no postnasal drip or exudate noted.   NECK:  Supple w/ fair ROM; no JVD; normal carotid impulses w/o bruits; no thyromegaly or nodules palpated; no lymphadenopathy.    RESP few expiratory wheezes.  Talks in full sentences.  No accessory muscle use, no dullness to percussion  CARD:  RRR, no m/r/g, no peripheral edema, pulses intact, no cyanosis or clubbing.  GI:   Soft & nt; nml bowel sounds; no organomegaly or masses detected.   Musco: Warm bil, no deformities or joint swelling noted.   Neuro: alert, no focal deficits noted.    Skin: Warm, no lesions or rashes    Lab Results:  CBC  BMET     PFT Results Latest Ref Rng & Units 04/17/2020  FVC-Pre L 1.69  FVC-Predicted Pre % 62  FVC-Post L 1.66  FVC-Predicted Post % 61  Pre FEV1/FVC % % 73  Post FEV1/FCV % % 79  FEV1-Pre L 1.23  FEV1-Predicted Pre % 61  FEV1-Post L 1.30  TLC L 6.97  TLC % Predicted % 133  RV % Predicted % 207    No results found for:  NITRICOXIDE      Assessment & Plan:   Acute asthmatic bronchitis Acute asthmatic bronchitis exacerbation-influenza and COVID-19 test were both negative. Patient is continue to treat the symptoms. We will treat with a Z-Pak and Medrol Dosepak. She is continue on her current maintenance regimen.  May use albuterol as needed  Plan  Patient Instructions  Zpack take as directed  Medrol dose pack.  Mucinex Twice daily  As needed  Cough/congestion  Delsym 2 tsp Twice daily  For cough As needed   Tessalon Three times a day  As needed  Cough .  Zyrtec 80m daily  Flonase daily .  Continue on Symbicort 2 puffs Twice daily  -rinse after use.  Continue on CPAP At bedtime  .  Work on healthy weight loss. Do not drive if sleepy .   Follow up with Dr. SHalford Chessman In 3 months and As needed   Please contact office for sooner follow up if symptoms do not improve or worsen or seek emergency care         TRexene Edison NP 09/09/2021

## 2021-09-09 NOTE — Progress Notes (Signed)
Reviewed and agree with assessment/plan.   Neo Yepiz, MD [facility] 09/09/2021, 3:41 PM Pager:  [phone]  

## 2021-09-09 NOTE — Assessment & Plan Note (Signed)
Acute asthmatic bronchitis exacerbation-influenza and COVID-19 test were both negative. Patient is continue to treat the symptoms. We will treat with a Z-Pak and Medrol Dosepak. She is continue on her current maintenance regimen.  May use albuterol as needed  Plan  Patient Instructions  Zpack take as directed  Medrol dose pack.  Mucinex Twice daily  As needed  Cough/congestion  Delsym 2 tsp Twice daily  For cough As needed   Tessalon Three times a day  As needed  Cough .  Zyrtec 10mg  daily  Flonase daily .  Continue on Symbicort 2 puffs Twice daily  -rinse after use.  Continue on CPAP At bedtime  .  Work on healthy weight loss. Do not drive if sleepy .   Follow up with Dr. Halford Chessman  In 3 months and As needed   Please contact office for sooner follow up if symptoms do not improve or worsen or seek emergency care

## 2021-09-09 NOTE — Patient Instructions (Addendum)
Zpack take as directed  Medrol dose pack.  Mucinex Twice daily  As needed  Cough/congestion  Delsym 2 tsp Twice daily  For cough As needed   Tessalon Three times a day  As needed  Cough .  Zyrtec 10mg  daily  Flonase daily .  Continue on Symbicort 2 puffs Twice daily  -rinse after use.  Continue on CPAP At bedtime  .  Work on healthy weight loss. Do not drive if sleepy .   Follow up with Dr. Halford Chessman  In 3 months and As needed   Please contact office for sooner follow up if symptoms do not improve or worsen or seek emergency care

## 2021-09-09 NOTE — Telephone Encounter (Signed)
New script for Symbicort faxed to [facility], 2 each with 3 refills. Nothing further needed.

## 2021-09-10 DIAGNOSIS — F418 Other specified anxiety disorders: Secondary | ICD-10-CM

## 2021-09-10 DIAGNOSIS — I10 Essential (primary) hypertension: Secondary | ICD-10-CM | POA: Diagnosis not present

## 2021-09-15 ENCOUNTER — Other Ambulatory Visit: Payer: Self-pay | Admitting: Family Medicine

## 2021-09-15 MED ORDER — AMOXICILLIN-POT CLAVULANATE 875-125 MG PO TABS: 1.0000 | ORAL_TABLET | Freq: Two times a day (BID) | ORAL | 0 refills | Status: DC

## 2021-09-15 NOTE — Telephone Encounter (Signed)
Sorry to hear this,  Rec : office visit recs Add Augmentin 875mg  Twice daily  for 7 days take with food   Will need ov if not improving .  Please contact office for sooner follow up if symptoms do not improve or worsen or seek emergency care

## 2021-09-15 NOTE — Telephone Encounter (Signed)
I called and spoke with the pt and notified of response from TP  She verbalized understanding  I have sent her rx to pharm  Nothing further needed

## 2021-09-15 NOTE — Telephone Encounter (Signed)
TP please advise on mychart message from the pt.  thanks

## 2021-09-24 ENCOUNTER — Other Ambulatory Visit: Payer: Self-pay | Admitting: Family

## 2021-09-29 ENCOUNTER — Telehealth: Payer: Self-pay | Admitting: Pulmonary Disease

## 2021-09-29 DIAGNOSIS — J4550 Severe persistent asthma, uncomplicated: Secondary | ICD-10-CM

## 2021-09-30 MED ORDER — DUPIXENT 300 MG/2ML ~~LOC~~ SOAJ: 300.0000 mg | SUBCUTANEOUS | 0 refills | Status: DC

## 2021-09-30 NOTE — Telephone Encounter (Signed)
Patient checking on RX for Dupixent. Patient phone number is [phone].

## 2021-09-30 NOTE — Telephone Encounter (Signed)
Refill sent for [place] to [place]: [phone] Rx sent for 3 months only.  Dose: 300 SQ mg every 14 days  Last OV: 09/09/21 Provider: Dr. Halford Chessman  Next OV: 3 months (March 2023) - not yet scheduled  Patient states she has not received renewal application for [place]. Provided her with phone number to call Dupixent [employer] fr Medicare Part D form to be mailed to her for 2023 enrollment. Advised her to call [facility] this week to schedule shipment of medication  Knox Saliva, PharmD, MPH, BCPS Clinical Pharmacist (Rheumatology and Pulmonology)

## 2021-10-09 ENCOUNTER — Other Ambulatory Visit: Payer: Self-pay | Admitting: Family

## 2021-10-15 ENCOUNTER — Telehealth: Payer: Self-pay | Admitting: Pharmacist

## 2021-10-15 NOTE — Telephone Encounter (Signed)
Received a fax from  [place] regarding an approval for [place] patient assistance from 10/12/21 to 10/11/22.   Phone number: [phone], option 1  Sent to scan center  Knox Saliva, PharmD, MPH, BCPS Clinical Pharmacist (Rheumatology and Pulmonology)

## 2021-10-17 ENCOUNTER — Encounter: Payer: Self-pay | Admitting: Family Medicine

## 2021-10-17 ENCOUNTER — Other Ambulatory Visit: Payer: Self-pay | Admitting: Family

## 2021-10-20 MED ORDER — ALPRAZOLAM 0.5 MG PO TABS
ORAL_TABLET | ORAL | 1 refills | Status: DC
Start: 2021-10-20 — End: 2022-05-26

## 2021-10-29 ENCOUNTER — Other Ambulatory Visit: Payer: Self-pay | Admitting: Family Medicine

## 2021-11-20 ENCOUNTER — Telehealth: Payer: Self-pay | Admitting: Pharmacist

## 2021-11-20 NOTE — Progress Notes (Signed)
Chronic Care Management Pharmacy Assistant   Name: Jenny [name]  MRN: [id number] DOB: 09-09-1941   Reason for Encounter: Disease State - General Adherence Call     Recent office visits:  None noted.   Recent consult visits:  09/09/21 Jenny Edison, NP - Pulmonology - Acute cough - Labs were ordered. azithromycin (ZITHROMAX Z-PAK) 250 MG tablet and methylPREDNISolone (MEDROL DOSEPAK) 4 MG TBPK tablet prescribed.    Hospital visits:  None in previous 6 months  Medications: Outpatient Encounter Medications as of 11/20/2021  Medication Sig Note   albuterol (PROVENTIL) (2.5 MG/3ML) 0.083% nebulizer solution Take 3 mLs (2.5 mg total) by nebulization every 6 (six) hours as needed for wheezing or shortness of breath.    albuterol (VENTOLIN HFA) 108 (90 Base) MCG/ACT inhaler Inhale 1-2 puffs into the lungs every 6 (six) hours as needed.    ALPRAZolam (XANAX) 0.5 MG tablet TAKE 1/2 TO 1 TABLET BY MOUTH 3 TIMES DAILY AS NEEDED FOR ANXIETY    amLODipine (NORVASC) 5 MG tablet TAKE 1 TABLET BY MOUTH  DAILY    amoxicillin-clavulanate (AUGMENTIN) 875-125 MG tablet Take 1 tablet by mouth 2 (two) times daily.    benzonatate (TESSALON) 200 MG capsule Take 1 capsule (200 mg total) by mouth 3 (three) times daily as needed for cough.    BINAXNOW COVID-19 AG HOME TEST KIT See admin instructions.    budesonide-formoterol (SYMBICORT) 160-4.5 MCG/ACT inhaler Inhale 2 puffs into the lungs 2 (two) times daily.    budesonide-formoterol (SYMBICORT) 160-4.5 MCG/ACT inhaler Inhale 2 puffs into the lungs 2 (two) times daily.    cetirizine (ZYRTEC) 10 MG tablet Take 10 mg by mouth daily as needed.    Dupilumab (DUPIXENT) 300 MG/2ML SOPN Inject 300 mg into the skin every 14 (fourteen) days.    escitalopram (LEXAPRO) 10 MG tablet Take 1 tablet (10 mg total) by mouth daily.    fluticasone (FLONASE) 50 MCG/ACT nasal spray PLACE 1 SPRAY INTO BOTH NOSTRILS DAILY.    levothyroxine (SYNTHROID) 112 MCG tablet TAKE 1  TABLET BY MOUTH  DAILY    methylPREDNISolone (MEDROL DOSEPAK) 4 MG TBPK tablet Take as directed.    pantoprazole (PROTONIX) 40 MG tablet TAKE 1 TABLET BY MOUTH EVERY DAY 30 MINUTES BEFORE DINNER    [DISCONTINUED] esomeprazole (NEXIUM) 40 MG capsule Take 40 mg by mouth daily before breakfast.   12/18/2011: Dr Marlyce Huge put pt on Zantac   No facility-administered encounter medications on file as of 11/20/2021.    Have you had any problems recently with your health? Patient denied any recent problems with her health.   Have you had any problems with your pharmacy? Patient denied any problems with her pharmacy.   What issues or side effects are you having with your medications? Patient denied any issues or side effects with her current medications.   What would you like me to pass along to [name], CPP for them to help you with?  Patient stated her blood pressures have "been out of whack" She states it may be her machine as it is old. She said the highest has been 150/90 but she was not symptomatic. She retook it and it was 136/72. She has not been having symptoms other than a dull headache occasionally. Patient will get her neighbors machine to compare and scheduled patient a phone call follow up with CPP for this week 12/03/21 @ 1:30pm.  What can we do to take care of you better? Patient did not have any  recommendations at this time.   Care Gaps  AWV: done 07/04/21 Colonoscopy: done 08/24/16 DM Eye Exam: N/A DM Foot Exam: N/A Microalbumin: unknown HbgAIC: done 09/30/20 (6.2) DEXA: done 06/29/17 Mammogram: done 06/29/17  Star Rating Drugs: No Star Rating Drugs noted.   Future Appointments  Date Time Provider [place]  12/03/2021  1:30 PM [facility] CCM PHARMACIST [facility] [place], [facility] Clinical Pharmacist Assistant  [phone]

## 2021-12-01 NOTE — Progress Notes (Unsigned)
Chronic Care Management Pharmacy Note  12/01/2021 Name:  Jenny [name] MRN:  [id number] DOB:  05-21-1941  Recommendations/Changes made from today's visit: Escitalopram 10 mg sent to mail order pharmacy  Subjective: Jenny [name] is an 81 y.o. year old female who is a primary patient of Tabori, [name], MD.  The CCM team was consulted for assistance with disease management and care coordination needs.    Engaged with patient by telephone for follow up visit in response to provider referral for pharmacy case management and/or care coordination services.   Consent to Services:  The patient was given information about Chronic Care Management services, agreed to services, and gave verbal consent prior to initiation of services.  Please see initial visit note for detailed documentation.   Patient Care Team: Midge Minium, MD as PCP - General (Family Medicine) Noralee Space, MD as Consulting Physician (Pulmonary Disease) Ladene Artist, MD as Consulting Physician (Gastroenterology) Garrel Ridgel, [place] as Consulting Physician (Podiatry) Dian Queen, MD as Consulting Physician (Obstetrics and Gynecology) [name], [facility] as Pharmacist (Pharmacist)  Recent office visits:  None noted.    Recent consult visits:  09/09/21 Rexene Edison, NP - Pulmonology - Acute cough - Labs were ordered. azithromycin (ZITHROMAX Z-PAK) 250 MG tablet and methylPREDNISolone (MEDROL DOSEPAK) 4 MG TBPK tablet prescribed.      Hospital visits:  None in previous 6 months  Objective:  Lab Results  Component Value Date   CREATININE 0.91 04/09/2021   CREATININE 0.96 10/04/2020   CREATININE 0.83 10/03/2020    Lab Results  Component Value Date   HGBA1C 6.2 (H) 09/30/2020   Last diabetic Eye exam: No results found for: HMDIABEYEEXA  Last diabetic Foot exam: No results found for: HMDIABFOOTEX      Component Value Date/Time   CHOL 131 04/09/2021 1407   TRIG 127.0 04/09/2021 1407    HDL 37.90 (L) 04/09/2021 1407   CHOLHDL 3 04/09/2021 1407   VLDL 25.4 04/09/2021 1407   LDLCALC 68 04/09/2021 1407    Hepatic Function Latest Ref Rng & Units 04/09/2021 10/04/2020 10/03/2020  Total Protein 6.0 - 8.3 g/dL 6.4 6.2(L) 6.7  Albumin 3.5 - 5.2 g/dL 4.0 3.3(L) 3.6  AST 0 - 37 U/L _0 ALT 0 - 35 U/L 9 37 26  Alk Phosphatase 39 - 117 U/L 83 73 70  Total Bilirubin 0.2 - 1.2 mg/dL 0.7 0.4 0.5  Bilirubin, Direct 0.0 - 0.3 mg/dL 0.2 - -    Lab Results  Component Value Date/Time   TSH 1.60 04/09/2021 02:07 PM   TSH 3.66 12/05/2019 09:43 AM   FREET4 1.45 08/03/2013 10:56 AM   FREET4 1.05 02/08/2013 09:39 AM    CBC Latest Ref Rng & Units 04/09/2021 10/04/2020 10/03/2020  WBC 4.0 - 10.5 K/uL 9.8 7.6 9.1  Hemoglobin 12.0 - 15.0 g/dL 13.4 14.3 14.0  Hematocrit 36.0 - 46.0 % 40.3 43.9 44.6  Platelets 150.0 - 400.0 K/uL 270.0 226 263    Lab Results  Component Value Date/Time   VD25OH 24.64 (L) 04/09/2021 02:07 PM   VD25OH 17.35 (L) 11/09/2019 09:31 AM   Clinical ASCVD:  The ASCVD Risk score (Arnett DK, et al., 2019) failed to calculate for the following reasons:   The 2019 ASCVD risk score is only valid for ages 70 to 29    Social History   Tobacco Use  Smoking Status Former   Packs/day: 1.50   Years: 30.00   Pack years:  45.00   Types: Cigarettes   Quit date: 10/13/1991   Years since quitting: 30.1  Smokeless Tobacco Never   BP Readings from Last 3 Encounters:  09/09/21 130/80  07/29/21 140/90  04/09/21 121/80   Pulse Readings from Last 3 Encounters:  09/09/21 75  07/29/21 84  04/09/21 71   Wt Readings from Last 3 Encounters:  09/09/21 213 lb 12.8 oz (97 kg)  07/29/21 218 lb (98.9 kg)  04/09/21 215 lb 3.2 oz (97.6 kg)    Assessment: Review of patient past medical history, allergies, medications, health status, including review of consultants reports, laboratory and other test data, was performed as part of comprehensive evaluation and provision of  chronic care management services.   SDOH:  (Social Determinants of Health) assessments and interventions performed: Yes   CCM Care Plan  Allergies  Allergen Reactions   Prednisone Other (See Comments)    Confusion, dizziness Medrol is ok   Codeine Nausea Only        Tramadol Itching    Medications Reviewed Today     Reviewed by Melvenia Needles, NP (Nurse Practitioner) on 09/09/21 at 1316  Med List Status: <None>   Medication Order Taking? Sig Documenting Provider Last Dose Status Informant  albuterol (PROVENTIL) (2.5 MG/3ML) 0.083% nebulizer solution [id number] Yes Take 3 mLs (2.5 mg total) by nebulization every 6 (six) hours as needed for wheezing or shortness of breath. Parrett, Fonnie Mu, NP  Active   albuterol (VENTOLIN HFA) 108 (90 Base) MCG/ACT inhaler [id number] Yes Inhale 1-2 puffs into the lungs every 6 (six) hours as needed. Parrett, Fonnie Mu, NP  Active   ALPRAZolam (XANAX) 0.5 MG tablet [id number] Yes TAKE 1/2 TO 1 TABLET BY MOUTH 3 TIMES DAILY AS NEEDED FOR ANXIETY Dutch Quint B, FNP Taking Active   amLODipine (NORVASC) 5 MG tablet [id number] Yes TAKE 1 TABLET BY MOUTH  DAILY Midge Minium, MD Taking Active   azithromycin (ZITHROMAX Z-PAK) 250 MG tablet [id number] Yes Take 2 tablets (500 mg) on  Day 1,  followed by 1 tablet (250 mg) once daily on Days 2 through 5. Parrett, Fonnie Mu, NP  Active   benzonatate (TESSALON) 200 MG capsule [id number] Yes Take 1 capsule (200 mg total) by mouth 3 (three) times daily as needed for cough. Melvenia Needles, NP  Active   BINAXNOW COVID-19 AG HOME TEST KIT [id number] Yes See admin instructions. [provider] Taking Active   budesonide-formoterol (SYMBICORT) 160-4.5 MCG/ACT inhaler [id number] Yes Inhale 2 puffs into the lungs 2 (two) times daily. Chesley Mires, MD Taking Active   cetirizine (ZYRTEC) 10 MG tablet [id number] Yes Take 10 mg by mouth daily as needed. [provider] Taking Active Self  Dupilumab ([place]) 300  MG/2ML SOPN [id number] Yes Inject 300 mg into the skin every 14 (fourteen) days. Chesley Mires, MD Taking Active   escitalopram (LEXAPRO) 10 MG tablet [id number] Yes Take 1 tablet (10 mg total) by mouth daily. Midge Minium, MD Taking Active     Discontinued 12/18/11 [phone] (Error)            Med Note Domingo Cocking, CHRISTY L   Fri Dec 18, 2011  8:42 AM) Dr Marlyce Huge put pt on Zantac  fluticasone (FLONASE) 50 MCG/ACT nasal spray [id number] Yes PLACE 1 SPRAY INTO BOTH NOSTRILS DAILY. Chesley Mires, MD Taking Active Self           Med Note Maud Deed   Mon Sep 30, 2020  3:55 PM)  levothyroxine (SYNTHROID) 112 MCG tablet [id number] Yes TAKE 1 TABLET BY MOUTH EVERY DAY Midge Minium, MD Taking Active   methylPREDNISolone (MEDROL DOSEPAK) 4 MG TBPK tablet [id number] Yes Take as directed. Parrett, Fonnie Mu, NP  Active   pantoprazole (PROTONIX) 40 MG tablet [id number] Yes TAKE 1 TABLET BY MOUTH EVERY DAY 30 MINUTES BEFORE Audie Pinto, [place], MD Taking Active   Vitamin D, Ergocalciferol, (DRISDOL) 1.25 MG (50000 UNIT) CAPS capsule [id number] Yes TAKE 1 CAPSULE (50,000 UNITS TOTAL) BY MOUTH EVERY 7 (SEVEN) DAYS FOR 12 DOSES. 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   Hyperglycemia 07/07/2015   Annual physical exam 08/06/2013   Atrophic vaginitis 06/17/2012   Peripheral vascular disease, unspecified ([place]) 06/02/2012   Urticaria 10/15/2011   Anemia 09/21/2011   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   Vitamin D deficiency 02/12/2011   Peripheral neuropathy 02/12/2011   Intertrigo 70/78/6754   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   History of chicken pox 01/14/2011   History of measles 01/14/2011   OTHER URINARY INCONTINENCE 03/12/2010   Depression with anxiety 03/06/2008   Essential hypertension 02/22/2008   Chronic bronchitis ([place]) 02/22/2008   DIVERTICULOSIS OF COLON 02/22/2008   Unilateral primary osteoarthritis, left knee 02/22/2008   Hypothyroidism 08/23/2007   Morbid obesity ([place]) 08/23/2007   OSA on CPAP 08/23/2007   Venous (peripheral) insufficiency 08/23/2007   Allergic rhinitis 08/23/2007   GERD without esophagitis 08/23/2007   BACK PAIN, LUMBAR 08/23/2007    Immunization History  Administered Date(s) Administered   Fluad Quad(high Dose 65+) 08/02/2019, 08/05/2020, 07/05/2021   Influenza Split 08/20/2011, 09/30/2012   Influenza Whole 08/13/2013   Influenza, High Dose Seasonal PF 10/13/2014, 09/03/2015, 06/03/2017, 09/25/2018   Influenza-Unspecified 07/11/2016   Moderna Sars-Covid-2 Vaccination 11/17/2019, 12/24/2019   Pneumococcal Conjugate-13 03/16/2014   Pneumococcal Polysaccharide-23 06/23/2016   Tdap 09/22/2011   Zoster Recombinat (Shingrix) 03/16/2014   Zoster, Live 03/16/2014    Conditions to be addressed/monitored: HTN, HLD, COPD, and Depression  There are no care plans that you recently modified to display for this patient.    Medication Assistance: None required.  Patient affirms current coverage meets needs.  Patient's preferred pharmacy is:  [employer] ([place], [place] [facility] [address] [place] [address] Phone: [phone] Fax: [place], [address] [place] [address] [place] [address] Phone: [phone] Fax: [place], [place] [address] [place] [address] Phone: [phone] Fax: [phone]  [id number] [id number] -  [address] [place] [address]Phone: [phone]: [phone] [facility] [place] [address] [address]Phone: [phone]: [phone]  Follow Up:  Patient agrees to Care Plan and Follow-up.  Plan:  3 month RPH f/u . 1 month patient call (ensure financial needs are met)  Future Appointments  Date Time Provider [place]  12/03/2021  1:30 PM [facility] CCM PHARMACIST [facility] [place], PharmD Clinical Pharmacist  [facility] [phone]    Current Barriers:  Financial assistance for Symbicort  Pharmacist Clinical Goal(s):  Patient will verbalize ability to afford treatment regimen contact provider office for questions/concerns as evidenced notation of same in electronic health record through collaboration with PharmD and provider.   Interventions: 1:1 collaboration with Midge Minium, MD regarding development and update of comprehensive plan of care as evidenced by provider attestation and co-signature Inter-disciplinary care team collaboration (see longitudinal plan of care) Comprehensive medication review performed; medication list updated in electronic medical record   Hypertension(BP goal <140/90) -Controlled -OSA on CPAP - no issues with compliance -Current treatment: Amlodipine 5 mg once daily -Current exercise ha -Denies hypotensive/hypertensive symptoms -Educated on BP goals and benefits of medications for prevention of heart attack, stroke and kidney damage; -Counseled to monitor BP at home 1-2x/week, document, and provide log at future appointments -Recommended to continue current medication  Update 12/03/21 120/77 P 70, 117/68 P 40 Patient did report a BP reading with 818M systolic but thinks it was her BP meter.  She has changed her batteries  and reporting more normal readings as of today. She does mention she has had headache lately. Have asked her to continue to monitor BP and report any elevated readings to me. She mentions she is having some swelling in her legs which could be related to amlodipine.  Have asked her to monitor her weight to make sure she is not retaining too much fluid. She requests refill on lasix which she is out of. Encouraged her to come in for visit if headache continues or BP remains becomes elevated at home. No other changes at this time.   Depression (Goal: minimize symptoms) -Controlled Feels there is ongoing benefit with escitalopram 5 mg, but does note some ongoing symptoms of depression. Often thinks about loss of grandson, who had lived with patient. He passed away 9 months ago.  -Current treatment: Escitalopram 5 mg once daily  Xanax 0.5 mg - 1/2 to 1 tablet tid -PHQ9: 10 -Educated on Benefits of medication for symptom control -Recommended to continue current medication Agreed to increase celexa to 10 mg daily - sent to mail order pharmacy. Will double up on escitalopram 5 mg (two tabs daily) until escitalopram 10 mg comes in through mail order. CPA pt call two weeks to review tolerability. PHQ9/GAD7 next month.    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 Had visit with Dr Halford Chessman recently and plan to possible deescalate Symbicort next OV.  -Current treatment  Symbicort 160/45 2 puffs BID (PAP) Dupixent 300 mg injection every 14 days (PAP) -Exacerbations requiring treatment in last 6 months: 0 -Patient denies consistent use of maintenance inhaler -Frequency of rescue inhaler use: n/a -Counseled on Proper inhaler technique; Benefits of consistent maintenance inhaler use -Recommended to continue current medication Assessed patient finances. Will be automatically renewed for AZ&ME PAP  - however pulm will need to send new erx - [place] to [facility] [place] the contracted mail order pharmacy. Messaged office.    Patient Goals/Self-Care Activities Patient will: - take medications as prescribed collaborate with provider on medication access solutions

## 2021-12-03 ENCOUNTER — Ambulatory Visit (INDEPENDENT_AMBULATORY_CARE_PROVIDER_SITE_OTHER): Payer: Medicare Other | Admitting: Pharmacist

## 2021-12-03 ENCOUNTER — Encounter: Payer: Self-pay | Admitting: Orthopedic Surgery

## 2021-12-03 DIAGNOSIS — I10 Essential (primary) hypertension: Secondary | ICD-10-CM

## 2021-12-03 DIAGNOSIS — F418 Other specified anxiety disorders: Secondary | ICD-10-CM

## 2021-12-03 NOTE — Patient Instructions (Addendum)
Visit Information   Goals Addressed             This Visit's Progress    Track and Manage My Blood Pressure-Hypertension       Timeframe:  Long-Range Goal Priority:  High Start Date:   12/03/21                          Expected End Date: 06/02/22                      Follow Up Date 04/02/22    - check blood pressure daily - choose a place to take my blood pressure (home, clinic or office, retail store) - write blood pressure results in a log or diary    Why is this important?   You won't feel high blood pressure, but it can still hurt your blood vessels.  High blood pressure can cause heart or kidney problems. It can also cause a stroke.  Making lifestyle changes like losing a little weight or eating less salt will help.  Checking your blood pressure at home and at different times of the day can help to control blood pressure.  If the doctor prescribes medicine remember to take it the way the doctor ordered.  Call the office if you cannot afford the medicine or if there are questions about it.     Notes:        Patient Care Plan: CCM Pharmacy Care Plan     Problem Identified: HTN, HLD, COPD, and Depression   Priority: High  Onset Date: 05/07/2021     Long-Range Goal: Disease Management   Start Date: 05/07/2021  Expected End Date: 08/22/2022  Recent Progress: On track  Priority: High  Note:    Current Barriers:  Financial assistance for Symbicort Possible elevation in BP  Pharmacist Clinical Goal(s):  Patient will verbalize ability to afford treatment regimen contact provider office for questions/concerns as evidenced notation of same in electronic health record through collaboration with PharmD and provider.   Interventions: 1:1 collaboration with Midge Minium, MD regarding development and update of comprehensive plan of care as evidenced by provider attestation and co-signature Inter-disciplinary care team collaboration (see longitudinal plan of  care) Comprehensive medication review performed; medication list updated in electronic medical record   Hypertension(BP goal <140/90) -Controlled -OSA on CPAP - no issues with compliance -Current treatment: Amlodipine 5 mg once daily -Current exercise ha -Denies hypotensive/hypertensive symptoms -Educated on BP goals and benefits of medications for prevention of heart attack, stroke and kidney damage; -Counseled to monitor BP at home 1-2x/week, document, and provide log at future appointments -Recommended to continue current medication  Update 12/03/21 120/77 P 70, 117/68 P 40 Patient did report a BP reading with 332R systolic but thinks it was her BP meter.  She has changed her batteries and reporting more normal readings as of today. She does mention she has had headache lately. Have asked her to continue to monitor BP and report any elevated readings to me. She mentions she is having some swelling in her legs which could be related to amlodipine.  Have asked her to monitor her weight to make sure she is not retaining too much fluid. She requests refill on lasix which she is out of. Encouraged her to come in for visit if headache continues or BP remains becomes elevated at home. No other changes at this time.   Depression (Goal: minimize symptoms) -Controlled  Feels there is ongoing benefit with escitalopram 5 mg, but does note some ongoing symptoms of depression. Often thinks about loss of grandson, who had lived with patient. He passed away 9 months ago.  -Current treatment: Escitalopram 10 mg once daily Appropriate, Effective, Safe, Accessible Xanax 0.5 mg - 1/2 to 1 tablet tid Appropriate, Effective, Safe, Accessible -PHQ9: 10 -Educated on Benefits of medication for symptom control -Recommended to continue current medication Agreed to increase celexa to 10 mg daily - sent to mail order pharmacy. Will double up on escitalopram 5 mg (two tabs daily) until escitalopram 10 mg comes  in through mail order. CPA pt call two weeks to review tolerability. PHQ9/GAD7 next month.   Update 12/03/21 Patient mood seems to be improving from a tough year where she lost her grandson. Continues on increased dose of Lexapro with no concerns, no changes needed at this time. 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 Had visit with Dr Halford Chessman recently and plan to possible deescalate Symbicort next OV.  -Current treatment  Symbicort 160/45 2 puffs BID (PAP) Dupixent 300 mg injection every 14 days (PAP) -Exacerbations requiring treatment in last 6 months: 0 -Patient denies consistent use of maintenance inhaler -Frequency of rescue inhaler use: n/a -Counseled on Proper inhaler technique; Benefits of consistent maintenance inhaler use -Recommended to continue current medication Assessed patient finances. Will be automatically renewed for AZ&ME PAP - however pulm will need to send new erx - can send to [facility] [place] the contracted mail order pharmacy. Messaged office.    Patient Goals/Self-Care Activities Patient will: - take medications as prescribed collaborate with provider on medication access solutions           The patient verbalized understanding of instructions, educational materials, and care plan provided today and declined offer to receive copy of patient instructions, educational materials, and care plan.  Telephone follow up appointment with pharmacy team member scheduled for: 6 months  Edythe Clarity, [place], PharmD Clinical Pharmacist  [facility] [phone]

## 2021-12-03 NOTE — Progress Notes (Signed)
Chronic Care Management Pharmacy Note  12/03/2021 Name:  Jenny [name] MRN:  [id number] DOB:  1941/09/24  Subjective: Jenny [name] is an 81 y.o. year old female who is a primary patient of Tabori, [name], MD.  The CCM team was consulted for assistance with disease management and care coordination needs.    Engaged with patient by telephone for follow up visit in response to provider referral for pharmacy case management and/or care coordination services.   Consent to Services:  The patient was given information about Chronic Care Management services, agreed to services, and gave verbal consent prior to initiation of services.  Please see initial visit note for detailed documentation.   Patient Care Team: Midge Minium, MD as PCP - General (Family Medicine) Noralee Space, MD as Consulting Physician (Pulmonary Disease) Ladene Artist, MD as Consulting Physician (Gastroenterology) Garrel Ridgel, DPM as Consulting Physician (Podiatry) Dian Queen, MD as Consulting Physician (Obstetrics and Gynecology) Edythe Clarity, [facility] (Pharmacist)  Objective:  Lab Results  Component Value Date   CREATININE 0.91 04/09/2021   CREATININE 0.96 10/04/2020   CREATININE 0.83 10/03/2020    Lab Results  Component Value Date   HGBA1C 6.2 (H) 09/30/2020   Last diabetic Eye exam: No results found for: HMDIABEYEEXA  Last diabetic Foot exam: No results found for: HMDIABFOOTEX      Component Value Date/Time   CHOL 131 04/09/2021 1407   TRIG 127.0 04/09/2021 1407   HDL 37.90 (L) 04/09/2021 1407   CHOLHDL 3 04/09/2021 1407   VLDL 25.4 04/09/2021 1407   [place] 68 04/09/2021 1407    Hepatic Function Latest Ref Rng & Units 04/09/2021 10/04/2020 10/03/2020  Total Protein 6.0 - 8.3 g/dL 6.4 6.2(L) 6.7  Albumin 3.5 - 5.2 g/dL 4.0 3.3(L) 3.6  AST 0 - 37 U/L _0 ALT 0 - 35 U/L 9 37 26  Alk Phosphatase 39 - 117 U/L 83 73 70  Total Bilirubin 0.2 - 1.2 mg/dL 0.7 0.4 0.5   Bilirubin, Direct 0.0 - 0.3 mg/dL 0.2 - -    Lab Results  Component Value Date/Time   TSH 1.60 04/09/2021 02:07 PM   TSH 3.66 12/05/2019 09:43 AM   FREET4 1.45 08/03/2013 10:56 AM   FREET4 1.05 02/08/2013 09:39 AM    CBC Latest Ref Rng & Units 04/09/2021 10/04/2020 10/03/2020  WBC 4.0 - 10.5 K/uL 9.8 7.6 9.1  Hemoglobin 12.0 - 15.0 g/dL 13.4 14.3 14.0  Hematocrit 36.0 - 46.0 % 40.3 43.9 44.6  Platelets 150.0 - 400.0 K/uL 270.0 226 263    Lab Results  Component Value Date/Time   VD25OH 24.64 (L) 04/09/2021 02:07 PM   VD25OH 17.35 (L) 11/09/2019 09:31 AM   Clinical ASCVD:  The ASCVD Risk score (Arnett DK, et al., 2019) failed to calculate for the following reasons:   The 2019 ASCVD risk score is only valid for ages 43 to 79    Social History   Tobacco Use  Smoking Status Former   Packs/day: 1.50   Years: 30.00   Pack years: 45.00   Types: Cigarettes   Quit date: 10/13/1991   Years since quitting: 30.1  Smokeless Tobacco Never   BP Readings from Last 3 Encounters:  09/09/21 130/80  07/29/21 140/90  04/09/21 121/80   Pulse Readings from Last 3 Encounters:  09/09/21 75  07/29/21 84  04/09/21 71   Wt Readings from Last 3 Encounters:  09/09/21 213 lb 12.8 oz (97 kg)  07/29/21 218 lb (98.9 kg)  04/09/21 215 lb 3.2 oz (97.6 kg)    Assessment: Review of patient past medical history, allergies, medications, health status, including review of consultants reports, laboratory and other test data, was performed as part of comprehensive evaluation and provision of chronic care management services.   SDOH:  (Social Determinants of Health) assessments and interventions performed: Yes   CCM Care Plan  Allergies  Allergen Reactions   Prednisone Other (See Comments)    Confusion, dizziness Medrol is ok   Codeine Nausea Only        Tramadol Itching    Medications Reviewed Today     Reviewed by Edythe Clarity, [facility] (Pharmacist) on 12/03/21 at 1426  Med List  Status: <None>   Medication Order Taking? Sig Documenting Provider Last Dose Status Informant  albuterol (PROVENTIL) (2.5 MG/3ML) 0.083% nebulizer solution [id number] Yes Take 3 mLs (2.5 mg total) by nebulization every 6 (six) hours as needed for wheezing or shortness of breath. Parrett, Fonnie Mu, NP Taking Active   albuterol (VENTOLIN HFA) 108 (90 Base) MCG/ACT inhaler [id number] Yes Inhale 1-2 puffs into the lungs every 6 (six) hours as needed. Parrett, Fonnie Mu, NP Taking Active   ALPRAZolam (XANAX) 0.5 MG tablet [id number] Yes TAKE 1/2 TO 1 TABLET BY MOUTH 3 TIMES DAILY AS NEEDED FOR ANXIETY Midge Minium, MD Taking Active   amLODipine (NORVASC) 5 MG tablet [id number] Yes TAKE 1 TABLET BY MOUTH  DAILY Midge Minium, MD Taking Active   amoxicillin-clavulanate (AUGMENTIN) 875-125 MG tablet [id number] Yes Take 1 tablet by mouth 2 (two) times daily. Parrett, Fonnie Mu, NP Taking Active   benzonatate (TESSALON) 200 MG capsule [id number] Yes Take 1 capsule (200 mg total) by mouth 3 (three) times daily as needed for cough. Melvenia Needles, NP Taking Active   BINAXNOW COVID-19 AG HOME TEST KIT [id number] Yes See admin instructions. [provider] Taking Active   budesonide-formoterol (SYMBICORT) 160-4.5 MCG/ACT inhaler [id number] Yes Inhale 2 puffs into the lungs 2 (two) times daily. Chesley Mires, MD Taking Active   budesonide-formoterol [facility]) 160-4.5 MCG/ACT inhaler [id number] Yes Inhale 2 puffs into the lungs 2 (two) times daily. Parrett, Fonnie Mu, NP Taking Active   cetirizine (ZYRTEC) 10 MG tablet [id number] Yes Take 10 mg by mouth daily as needed. [provider] Taking Active Self  Dupilumab ([place]) 300 MG/2ML SOPN [id number] Yes Inject 300 mg into the skin every 14 (fourteen) days. Chesley Mires, MD Taking Active   escitalopram (LEXAPRO) 10 MG tablet [id number] Yes Take 1 tablet (10 mg total) by mouth daily. Midge Minium, MD Taking Active     Discontinued 12/18/11 [phone]  (Error)            Med Note Domingo Cocking, CHRISTY L   Fri Dec 18, 2011  8:42 AM) Dr Marlyce Huge put pt on Zantac  fluticasone (FLONASE) 50 MCG/ACT nasal spray [id number] Yes PLACE 1 SPRAY INTO BOTH NOSTRILS DAILY. Chesley Mires, MD Taking Active Self           Med Note Fransico Meadow Sep 30, 2020  3:55 PM)    levothyroxine (SYNTHROID) 112 MCG tablet [id number] Yes TAKE 1 TABLET BY MOUTH  DAILY Midge Minium, MD Taking Active   methylPREDNISolone (MEDROL DOSEPAK) 4 MG TBPK tablet [id number] Yes Take as directed. Parrett, Fonnie Mu, NP Taking Active   pantoprazole (PROTONIX) 40 MG tablet [id number] Yes TAKE 1 TABLET BY MOUTH EVERY DAY 30 MINUTES BEFORE [address] [place],  MD Taking Active             Patient Active Problem List   Diagnosis Date Noted   Acute asthmatic bronchitis 09/09/2021   COVID-19 virus infection 09/30/2020   NSIP (nonspecific interstitial pneumonitis) ([place]) 09/30/2020   Extrinsic asthma without status asthmaticus 09/25/2020   Abnormal CT of the chest 04/18/2020   Colon cancer screening 06/15/2016   COPD mixed type ([place]) 02/10/2016   Hyperglycemia 07/07/2015   Annual physical exam 08/06/2013   Atrophic vaginitis 06/17/2012   Peripheral vascular disease, unspecified ([place]) 06/02/2012   Urticaria 10/15/2011   Anemia 09/21/2011   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   Vitamin D deficiency 02/12/2011   Peripheral neuropathy 02/12/2011   Intertrigo 76/81/1572   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   History of chicken pox 01/14/2011   History of measles 01/14/2011   OTHER URINARY INCONTINENCE 03/12/2010   Depression with anxiety 03/06/2008   Essential hypertension 02/22/2008   Chronic bronchitis ([place]) 02/22/2008   DIVERTICULOSIS OF COLON 02/22/2008   Unilateral primary osteoarthritis, left knee 02/22/2008   Hypothyroidism 08/23/2007   Morbid obesity ([place]) 08/23/2007   OSA on CPAP 08/23/2007   Venous (peripheral)  insufficiency 08/23/2007   Allergic rhinitis 08/23/2007   GERD without esophagitis 08/23/2007   BACK PAIN, LUMBAR 08/23/2007    Immunization History  Administered Date(s) Administered   Fluad Quad(high Dose 65+) 08/02/2019, 08/05/2020, 07/05/2021   Influenza Split 08/20/2011, 09/30/2012   Influenza Whole 08/13/2013   Influenza, High Dose Seasonal PF 10/13/2014, 09/03/2015, 06/03/2017, 09/25/2018   Influenza-Unspecified 07/11/2016   Moderna Sars-Covid-2 Vaccination 11/17/2019, 12/24/2019   Pneumococcal Conjugate-13 03/16/2014   Pneumococcal Polysaccharide-23 06/23/2016   Tdap 09/22/2011   Zoster Recombinat (Shingrix) 03/16/2014   Zoster, Live 03/16/2014    Conditions to be addressed/monitored: HTN, HLD, COPD, and Depression  Care Plan : [place]  Updates made by Edythe Clarity, RPH since 12/03/2021 12:00 AM     Problem: HTN, HLD, COPD, and Depression   Priority: High  Onset Date: 05/07/2021     Long-Range Goal: Disease Management   Start Date: 05/07/2021  Expected End Date: 08/22/2022  Recent Progress: On track  Priority: High  Note:    Current Barriers:  Financial assistance for Symbicort Possible elevation in BP  Pharmacist Clinical Goal(s):  Patient will verbalize ability to afford treatment regimen contact provider office for questions/concerns as evidenced notation of same in electronic health record through collaboration with PharmD and provider.   Interventions: 1:1 collaboration with Midge Minium, MD regarding development and update of comprehensive plan of care as evidenced by provider attestation and co-signature Inter-disciplinary care team collaboration (see longitudinal plan of care) Comprehensive medication review performed; medication list updated in electronic medical record   Hypertension(BP goal <140/90) -Controlled -OSA on CPAP - no issues with compliance -Current treatment: Amlodipine 5 mg once daily -Current exercise  ha -Denies hypotensive/hypertensive symptoms -Educated on BP goals and benefits of medications for prevention of heart attack, stroke and kidney damage; -Counseled to monitor BP at home 1-2x/week, document, and provide log at future appointments -Recommended to continue current medication  Update 12/03/21 120/77 P 70, 117/68 P 40 Patient did report a BP reading with 416L systolic but thinks it was her BP meter.  She has changed her batteries and reporting more normal readings as of today. She does mention she has had headache lately. Have asked her to continue to monitor BP and report any elevated readings to  me. She mentions she is having some swelling in her legs which could be related to amlodipine.  Have asked her to monitor her weight to make sure she is not retaining too much fluid. She requests refill on lasix which she is out of. Encouraged her to come in for visit if headache continues or BP remains becomes elevated at home. No other changes at this time.   Depression (Goal: minimize symptoms) -Controlled Feels there is ongoing benefit with escitalopram 5 mg, but does note some ongoing symptoms of depression. Often thinks about loss of grandson, who had lived with patient. He passed away 9 months ago.  -Current treatment: Escitalopram 10 mg once daily Appropriate, Effective, Safe, Accessible Xanax 0.5 mg - 1/2 to 1 tablet tid Appropriate, Effective, Safe, Accessible -PHQ9: 10 -Educated on Benefits of medication for symptom control -Recommended to continue current medication Agreed to increase celexa to 10 mg daily - sent to mail order pharmacy. Will double up on escitalopram 5 mg (two tabs daily) until escitalopram 10 mg comes in through mail order. CPA pt call two weeks to review tolerability. PHQ9/GAD7 next month.   Update 12/03/21 Patient mood seems to be improving from a tough year where she lost her grandson. Continues on increased dose of Lexapro with no concerns, no  changes needed at this time. 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 Had visit with Dr Halford Chessman recently and plan to possible deescalate Symbicort next OV.  -Current treatment  Symbicort 160/45 2 puffs BID (PAP) Dupixent 300 mg injection every 14 days (PAP) -Exacerbations requiring treatment in last 6 months: 0 -Patient denies consistent use of maintenance inhaler -Frequency of rescue inhaler use: n/a -Counseled on Proper inhaler technique; Benefits of consistent maintenance inhaler use -Recommended to continue current medication Assessed patient finances. Will be automatically renewed for AZ&ME PAP - however pulm will need to send new erx - can send to [facility] [place] the contracted mail order pharmacy. Messaged office.    Patient Goals/Self-Care Activities Patient will: - take medications as prescribed collaborate with provider on medication access solutions           Medication Assistance: None required.  Patient affirms current coverage meets needs.  Patient's preferred pharmacy is:  [employer] ([place], [place] [facility] [place] [address] [address] Phone: [phone] Fax: [place], [address] [place] [address] Phone: [phone] Fax: [place], [address] [place] [address] Phone: [phone] Fax: [phone]  [id number] [place] - [address] [place]  [id number]: [phone]: [phone] [facility] [place] [address]  [address]Phone: [phone]: [phone]  Follow Up:  Patient agrees to Care Plan and Follow-up.  Plan:  3 month CMA FU, 6 month PharmD  No future appointments.   CBeverly Milch PharmD Clinical Pharmacist  [facility]([phone]

## 2021-12-04 ENCOUNTER — Telehealth: Payer: Self-pay

## 2021-12-04 ENCOUNTER — Other Ambulatory Visit: Payer: Self-pay

## 2021-12-04 NOTE — Telephone Encounter (Signed)
VOB submitted for Durolane, left knee. °BV pending. °

## 2021-12-09 DIAGNOSIS — I10 Essential (primary) hypertension: Secondary | ICD-10-CM | POA: Diagnosis not present

## 2021-12-09 DIAGNOSIS — F418 Other specified anxiety disorders: Secondary | ICD-10-CM

## 2021-12-12 ENCOUNTER — Telehealth: Payer: Self-pay | Admitting: Pharmacist

## 2021-12-12 NOTE — Progress Notes (Signed)
? ? ?Chronic Care Management ?Pharmacy Assistant  ? ?Name: Jenny [name]  MRN: [id number] DOB: May 14, 1941 ? ? ?Reason for Encounter: Disease State - Hypertension Call  ?  ? ?Recent office visits:  ?None noted. ? ?Recent consult visits:  ?None noted. ? ?Hospital visits:  ?None in previous 6 months ? ?Medications: ?Outpatient Encounter Medications as of 12/12/2021  ?Medication Sig Note  ? albuterol (PROVENTIL) (2.5 MG/3ML) 0.083% nebulizer solution Take 3 mLs (2.5 mg total) by nebulization every 6 (six) hours as needed for wheezing or shortness of breath.   ? albuterol (VENTOLIN HFA) 108 (90 Base) MCG/ACT inhaler Inhale 1-2 puffs into the lungs every 6 (six) hours as needed.   ? ALPRAZolam (XANAX) 0.5 MG tablet TAKE 1/2 TO 1 TABLET BY MOUTH 3 TIMES DAILY AS NEEDED FOR ANXIETY   ? amLODipine (NORVASC) 5 MG tablet TAKE 1 TABLET BY MOUTH  DAILY   ? amoxicillin-clavulanate (AUGMENTIN) 875-125 MG tablet Take 1 tablet by mouth 2 (two) times daily.   ? benzonatate (TESSALON) 200 MG capsule Take 1 capsule (200 mg total) by mouth 3 (three) times daily as needed for cough.   ? BINAXNOW COVID-19 AG HOME TEST KIT See admin instructions.   ? budesonide-formoterol (SYMBICORT) 160-4.5 MCG/ACT inhaler Inhale 2 puffs into the lungs 2 (two) times daily.   ? budesonide-formoterol (SYMBICORT) 160-4.5 MCG/ACT inhaler Inhale 2 puffs into the lungs 2 (two) times daily.   ? cetirizine (ZYRTEC) 10 MG tablet Take 10 mg by mouth daily as needed.   ? Dupilumab (DUPIXENT) 300 MG/2ML SOPN Inject 300 mg into the skin every 14 (fourteen) days.   ? escitalopram (LEXAPRO) 10 MG tablet Take 1 tablet (10 mg total) by mouth daily.   ? fluticasone (FLONASE) 50 MCG/ACT nasal spray PLACE 1 SPRAY INTO BOTH NOSTRILS DAILY.   ? levothyroxine (SYNTHROID) 112 MCG tablet TAKE 1 TABLET BY MOUTH  DAILY   ? methylPREDNISolone (MEDROL DOSEPAK) 4 MG TBPK tablet Take as directed.   ? pantoprazole (PROTONIX) 40 MG tablet TAKE 1 TABLET BY MOUTH EVERY DAY 30 MINUTES  BEFORE DINNER   ? [DISCONTINUED] esomeprazole (NEXIUM) 40 MG capsule Take 40 mg by mouth daily before breakfast.   12/18/2011: Dr Marlyce Huge put pt on Zantac  ? ?No facility-administered encounter medications on file as of 12/12/2021.  ? ? ?Current antihypertensive regimen:  ?Amlodipine 5 mg once daily ? ?How often are you checking your Blood Pressure?  ? ? ? ?Current home BP readings:  ? ? ?What recent interventions/DTPs have been made by any provider to improve Blood Pressure control since last CPP Visit:  ? ? ? ? ?Any recent hospitalizations or ED visits since last visit with CPP?  ?Patient has not had any ED visits or hospitalizations since last visit with CPP. ? ? ?What diet changes have been made to improve Blood Pressure Control?  ? ? ? ?What exercise is being done to improve your Blood Pressure Control?  ? ? ? ? ?Adherence Review: ?Is the patient currently on ACE/ARB medication? No ?Does the patient have >5 day gap between last estimated fill dates? No ? ? ?Care Gaps ?  ?AWV: done 07/04/21 ?Colonoscopy: done 08/24/16 ?DM Eye Exam: N/A ?DM Foot Exam: done 05/29/21  ?Microalbumin: N/A ?HbgAIC: done 09/30/20 (6.2) ?DEXA: done 06/29/17 ?Mammogram: done 06/29/17 ? ? ?Star Rating Drugs: ?No Star Rating Drugs noted.  ? ?Future Appointments  ?Date Time Provider [place]  ?06/03/2022  2:15 PM [facility] CCM PHARMACIST [facility]  ? ?Multiple  attempts were made to contact patient. Attempts were unsuccessful. / ls,CMA  ? ? ?Liza Showfety, CCMA ?Clinical Pharmacist Assistant  ?[phone] ? ? ?

## 2021-12-16 ENCOUNTER — Telehealth: Payer: Self-pay

## 2021-12-16 MED ORDER — ALBUTEROL SULFATE HFA 108 (90 BASE) MCG/ACT IN AERS
1.0000 | INHALATION_SPRAY | Freq: Four times a day (QID) | RESPIRATORY_TRACT | 3 refills | Status: DC | PRN
Start: 2021-12-16 — End: 2022-04-24

## 2021-12-16 NOTE — Telephone Encounter (Signed)
Approved for Durolane, left knee. ?Progress ?Patient will be responsible for 20% OOP. ?Co-pay of $20.00 ?No PA required ? ?Appt. 01/02/2022 with Dr. Marlou Sa ?

## 2022-01-02 ENCOUNTER — Other Ambulatory Visit: Payer: Self-pay

## 2022-01-02 ENCOUNTER — Telehealth: Payer: Self-pay

## 2022-01-02 ENCOUNTER — Ambulatory Visit (INDEPENDENT_AMBULATORY_CARE_PROVIDER_SITE_OTHER): Payer: Medicare Other | Admitting: Surgical

## 2022-01-02 DIAGNOSIS — M1712 Unilateral primary osteoarthritis, left knee: Secondary | ICD-10-CM

## 2022-01-02 NOTE — Telephone Encounter (Signed)
Auth needed for left knee gel injection  

## 2022-01-02 NOTE — Telephone Encounter (Signed)
Noted.  ?Will submit in August, 2023 for next available gel injection. ?

## 2022-01-03 ENCOUNTER — Encounter: Payer: Self-pay | Admitting: Orthopedic Surgery

## 2022-01-03 MED ORDER — LIDOCAINE HCL 1 % IJ SOLN
5.0000 mL | INTRAMUSCULAR | Status: AC | PRN
  Administered 2022-01-02: 5 mL

## 2022-01-03 MED ORDER — SODIUM HYALURONATE 60 MG/3ML IX PRSY
60.0000 mg | PREFILLED_SYRINGE | INTRA_ARTICULAR | Status: AC | PRN
  Administered 2022-01-02: 60 mg via INTRA_ARTICULAR

## 2022-01-03 NOTE — Progress Notes (Signed)
? ?  Procedure Note ? ?Patient: Jenny [name]             ?Date of Birth: July 16, 1941           ?MRN: [id number]             ?Visit Date: 01/02/2022 ? ?Procedures: ?Visit Diagnoses:  ?1. Unilateral primary osteoarthritis, left knee   ? ? ?Large Joint Inj: L knee on 01/02/2022 10:43 AM ?Indications: diagnostic evaluation, joint swelling and pain ?Details: 18 G 1.5 in needle, superolateral approach ? ?Arthrogram: No ? ?Medications: 5 mL lidocaine 1 %; 60 mg Sodium Hyaluronate 60 MG/3ML ?Outcome: tolerated well, no immediate complications ? ?Left knee Durolane injection successfully administered patient tolerated procedure well.  Underside of the patella was felt with the tip of the needle prior to injection of Durolane for confirmation.  Plan to preapproved patient for repeat gel injection in the future to come back in 6 months.  Patient agreed with plan.  Cortisone injection is not helping with her pain. ? ?This patient is diagnosed with osteoarthritis of the knee(s).   ? ?Radiographs show evidence of joint space narrowing, osteophytes, subchondral sclerosis and/or subchondral cysts.  This patient has knee pain which interferes with functional and activities of daily living.   ? ?This patient has experienced inadequate response, adverse effects and/or intolerance with conservative treatments such as acetaminophen, NSAIDS, topical creams, physical therapy or regular exercise, knee bracing and/or weight loss.  ? ?This patient has experienced inadequate response or has a contraindication to intra articular steroid injections for at least 3 months.  ? ?This patient is not scheduled to have a total knee replacement within 6 months of starting treatment with viscosupplementation. ? ? ?Procedure, treatment alternatives, risks and benefits explained, specific risks discussed. Consent was given by the patient. Immediately prior to procedure a time out was called to verify the correct patient, procedure, equipment, support staff  and site/side marked as required. Patient was prepped and draped in the usual sterile fashion.  ? ? ? ? ? ?

## 2022-01-11 ENCOUNTER — Encounter: Payer: Self-pay | Admitting: Family Medicine

## 2022-01-14 ENCOUNTER — Other Ambulatory Visit: Payer: Self-pay | Admitting: Family Medicine

## 2022-01-14 DIAGNOSIS — F418 Other specified anxiety disorders: Secondary | ICD-10-CM

## 2022-01-14 NOTE — Telephone Encounter (Signed)
Patient is requesting a refill of the following medications: ?Requested Prescriptions  ? ?Pending Prescriptions Disp Refills  ? escitalopram (LEXAPRO) 10 MG tablet [Pharmacy Med Name: Escitalopram Oxalate 10 MG Oral Tablet] 90 tablet 3  ?  Sig: TAKE 1 TABLET BY MOUTH DAILY  ? ? ?Date of patient request: 01/14/2022 ?Last office visit: 07/04/2021 ?Date of last refill: 08/22/2021 ?Last refill amount: 90 tablets  ?Follow up time period per chart: 06/03/2022  ?

## 2022-02-11 ENCOUNTER — Telehealth: Payer: Self-pay | Admitting: Pharmacist

## 2022-02-11 NOTE — Progress Notes (Signed)
  Chronic Care Management Pharmacy Assistant   Name: Jenny [name]  MRN: [id number] DOB: 08/22/1941   Reason for Encounter: Disease State - General Adherence Call     Recent office visits:  None noted.   Recent consult visits:  01/02/22 Charles Magnant PA-C - Orthopedics - Unilateral primary Osteoarthritis - Jont injection administered in office. Follow up in 6 months.   Hospital visits:  None in previous 6 months  Medications: Outpatient Encounter Medications as of 02/11/2022  Medication Sig Note   escitalopram (LEXAPRO) 10 MG tablet TAKE 1 TABLET BY MOUTH DAILY    albuterol (PROVENTIL) (2.5 MG/3ML) 0.083% nebulizer solution Take 3 mLs (2.5 mg total) by nebulization every 6 (six) hours as needed for wheezing or shortness of breath.    albuterol (VENTOLIN HFA) 108 (90 Base) MCG/ACT inhaler Inhale 1-2 puffs into the lungs every 6 (six) hours as needed.    ALPRAZolam (XANAX) 0.5 MG tablet TAKE 1/2 TO 1 TABLET BY MOUTH 3 TIMES DAILY AS NEEDED FOR ANXIETY    amLODipine (NORVASC) 5 MG tablet TAKE 1 TABLET BY MOUTH  DAILY    amoxicillin-clavulanate (AUGMENTIN) 875-125 MG tablet Take 1 tablet by mouth 2 (two) times daily.    benzonatate (TESSALON) 200 MG capsule Take 1 capsule (200 mg total) by mouth 3 (three) times daily as needed for cough.    BINAXNOW COVID-19 AG HOME TEST KIT See admin instructions.    budesonide-formoterol (SYMBICORT) 160-4.5 MCG/ACT inhaler Inhale 2 puffs into the lungs 2 (two) times daily.    budesonide-formoterol (SYMBICORT) 160-4.5 MCG/ACT inhaler Inhale 2 puffs into the lungs 2 (two) times daily.    cetirizine (ZYRTEC) 10 MG tablet Take 10 mg by mouth daily as needed.    Dupilumab (DUPIXENT) 300 MG/2ML SOPN Inject 300 mg into the skin every 14 (fourteen) days.    fluticasone (FLONASE) 50 MCG/ACT nasal spray PLACE 1 SPRAY INTO BOTH NOSTRILS DAILY.    levothyroxine (SYNTHROID) 112 MCG tablet TAKE 1 TABLET BY MOUTH  DAILY    methylPREDNISolone (MEDROL DOSEPAK) 4  MG TBPK tablet Take as directed.    pantoprazole (PROTONIX) 40 MG tablet TAKE 1 TABLET BY MOUTH EVERY DAY 30 MINUTES BEFORE DINNER    [DISCONTINUED] esomeprazole (NEXIUM) 40 MG capsule Take 40 mg by mouth daily before breakfast.   12/18/2011: Dr Vinwinkle put pt on Zantac   No facility-administered encounter medications on file as of 02/11/2022.    Current antihypertensive regimen:  Amlodipine 5 mg once daily  How often are you checking your Blood Pressure?  Patient reported checking blood pressures several days a week.    Current home BP readings: no reading to report at this time.    What recent interventions/DTPs have been made by any provider to improve Blood Pressure control since last CPP Visit:  Patient denied any recent medication changes since last CPP visit.    Any recent hospitalizations or ED visits since last visit with CPP?  Patient has not had any ED visits or hospitalizations since last visit with CPP.    What diet changes have been made to improve Blood Pressure Control?  Patient reported she is limiting her salt intake in her diet.    What exercise is being done to improve your Blood Pressure Control?  Patient reported staying as active as possible.     Adherence Review: Is the patient currently on ACE/ARB medication? No Does the patient have >5 day gap between last estimated fill dates? No    Care   Gaps   AWV: done 07/04/21 Colonoscopy: done 08/24/16 DM Eye Exam: N/A DM Foot Exam: done 05/29/21  Microalbumin: N/A HbgAIC: done 09/30/20 (6.2) DEXA: done 06/29/17 Mammogram: done 06/29/17     Star Rating Drugs: No Star Rating Drugs noted.     Future Appointments  Date Time Provider Department Center  06/03/2022  2:15 PM [facility] CCM PHARMACIST [facility]     Liza Showfety, CCMA Clinical Pharmacist Assistant  [phone]   

## 2022-03-25 ENCOUNTER — Encounter: Payer: Self-pay | Admitting: Family Medicine

## 2022-04-02 ENCOUNTER — Telehealth: Payer: Self-pay | Admitting: Family Medicine

## 2022-04-02 ENCOUNTER — Ambulatory Visit (INDEPENDENT_AMBULATORY_CARE_PROVIDER_SITE_OTHER): Payer: Medicare Other | Admitting: Family Medicine

## 2022-04-02 ENCOUNTER — Emergency Department (HOSPITAL_COMMUNITY)
Admission: EM | Admit: 2022-04-02 | Discharge: 2022-04-02 | Disposition: A | Discharge status: Home / Self Care | Payer: Medicare Other | Attending: Emergency Medicine | Admitting: Emergency Medicine

## 2022-04-02 ENCOUNTER — Encounter: Payer: Self-pay | Admitting: Family Medicine

## 2022-04-02 ENCOUNTER — Other Ambulatory Visit: Payer: Self-pay | Admitting: Physician Assistant

## 2022-04-02 ENCOUNTER — Other Ambulatory Visit: Payer: Self-pay

## 2022-04-02 ENCOUNTER — Ambulatory Visit (INDEPENDENT_AMBULATORY_CARE_PROVIDER_SITE_OTHER): Payer: Medicare Other

## 2022-04-02 ENCOUNTER — Encounter (HOSPITAL_COMMUNITY): Payer: Self-pay | Admitting: Cardiology

## 2022-04-02 VITALS — BP 128/80 | HR 36 | Temp 98.7°F | Resp 17 | Ht 64.0 in | Wt 210.1 lb

## 2022-04-02 DIAGNOSIS — I498 Other specified cardiac arrhythmias: Secondary | ICD-10-CM

## 2022-04-02 DIAGNOSIS — R5383 Other fatigue: Secondary | ICD-10-CM | POA: Diagnosis not present

## 2022-04-02 DIAGNOSIS — R531 Weakness: Secondary | ICD-10-CM | POA: Insufficient documentation

## 2022-04-02 DIAGNOSIS — Z7951 Long term (current) use of inhaled steroids: Secondary | ICD-10-CM | POA: Diagnosis not present

## 2022-04-02 DIAGNOSIS — E039 Hypothyroidism, unspecified: Secondary | ICD-10-CM | POA: Insufficient documentation

## 2022-04-02 DIAGNOSIS — R001 Bradycardia, unspecified: Secondary | ICD-10-CM | POA: Diagnosis not present

## 2022-04-02 DIAGNOSIS — I493 Ventricular premature depolarization: Secondary | ICD-10-CM

## 2022-04-02 DIAGNOSIS — Z79899 Other long term (current) drug therapy: Secondary | ICD-10-CM | POA: Diagnosis not present

## 2022-04-02 DIAGNOSIS — R0602 Shortness of breath: Secondary | ICD-10-CM | POA: Diagnosis not present

## 2022-04-02 DIAGNOSIS — J449 Chronic obstructive pulmonary disease, unspecified: Secondary | ICD-10-CM | POA: Diagnosis not present

## 2022-04-02 DIAGNOSIS — R0609 Other forms of dyspnea: Secondary | ICD-10-CM

## 2022-04-02 DIAGNOSIS — R110 Nausea: Secondary | ICD-10-CM | POA: Diagnosis not present

## 2022-04-02 DIAGNOSIS — Z743 Need for continuous supervision: Secondary | ICD-10-CM | POA: Diagnosis not present

## 2022-04-02 DIAGNOSIS — R6889 Other general symptoms and signs: Secondary | ICD-10-CM | POA: Diagnosis not present

## 2022-04-02 DIAGNOSIS — I499 Cardiac arrhythmia, unspecified: Secondary | ICD-10-CM | POA: Diagnosis not present

## 2022-04-02 LAB — BASIC METABOLIC PANEL
Anion gap: 8 (ref 5–15)
BUN: 18 mg/dL (ref 8–23)
CO2: 28 mmol/L (ref 22–32)
Calcium: 9 mg/dL (ref 8.9–10.3)
Chloride: 104 mmol/L (ref 98–111)
Creatinine, Ser: 0.99 mg/dL (ref 0.44–1.00)
GFR, Estimated: 57 mL/min — ABNORMAL LOW (ref >60)
Glucose, Bld: 104 mg/dL — ABNORMAL HIGH (ref 70–99)
Potassium: 4.8 mmol/L (ref 3.5–5.1)
Sodium: 140 mmol/L (ref 135–145)

## 2022-04-02 LAB — CBC WITH DIFFERENTIAL/PLATELET
Abs Immature Granulocytes: 0.02 10*3/uL (ref 0.00–0.07)
Basophils Absolute: 0.1 10*3/uL (ref 0.0–0.1)
Basophils Relative: 1 %
Eosinophils Absolute: 0.5 10*3/uL (ref 0.0–0.5)
Eosinophils Relative: 8 %
HCT: 44.6 % (ref 36.0–46.0)
Hemoglobin: 14.4 g/dL (ref 12.0–15.0)
Immature Granulocytes: 0 %
Lymphocytes Relative: 29 %
Lymphs Abs: 1.9 10*3/uL (ref 0.7–4.0)
MCH: 30.3 pg (ref 26.0–34.0)
MCHC: 32.3 g/dL (ref 30.0–36.0)
MCV: 93.9 fL (ref 80.0–100.0)
Monocytes Absolute: 0.5 10*3/uL (ref 0.1–1.0)
Monocytes Relative: 8 %
Neutro Abs: 3.5 10*3/uL (ref 1.7–7.7)
Neutrophils Relative %: 54 %
Platelets: 273 10*3/uL (ref 150–400)
RBC: 4.75 MIL/uL (ref 3.87–5.11)
RDW: 13.2 % (ref 11.5–15.5)
WBC: 6.6 10*3/uL (ref 4.0–10.5)
nRBC: 0 % (ref 0.0–0.2)

## 2022-04-02 LAB — TROPONIN I (HIGH SENSITIVITY)
Troponin I (High Sensitivity): 3 ng/L (ref <18)
Troponin I (High Sensitivity): 3 ng/L (ref <18)

## 2022-04-02 LAB — LACTIC ACID, PLASMA
Lactic Acid, Venous: 1.3 mmol/L (ref 0.5–1.9)
Lactic Acid, Venous: 0.9 mmol/L (ref 0.5–1.9)

## 2022-04-02 LAB — TSH: TSH: 1.3 u[IU]/mL (ref 0.350–4.500)

## 2022-04-02 LAB — PHOSPHORUS: Phosphorus: 3.2 mg/dL (ref 2.5–4.6)

## 2022-04-02 LAB — MAGNESIUM: Magnesium: 2.2 mg/dL (ref 1.7–2.4)

## 2022-04-02 NOTE — ED Triage Notes (Signed)
Pt arriving via [place] from Dr. Gabriel Carina.  Pt was having episodes of nausea and weankess past couple months. The Dr. Alyse Low she was experiencing bradycardia ranging in the 30s. Pt was converting in and out of bradycardia. Pt experiences weakness upon exertion.   weakness upon exertion  last vital signs 184/94 HR: 74  O2: 96 on room air  22 LFA

## 2022-04-02 NOTE — Discharge Instructions (Signed)
You were seen in the emergency room today with weakness.  You were in an abnormal heart rhythm which has resolved but the cardiology office will be calling you to set up a follow-up appointment for additional testing.  Return with any new or suddenly worsening symptoms.

## 2022-04-02 NOTE — ED Notes (Signed)
Patient verbalizes understanding of discharge instructions. Opportunity for questioning and answers were provided. Armband removed by staff, pt discharged from ED.  

## 2022-04-02 NOTE — H&P (Deleted)
See consult note

## 2022-04-02 NOTE — ED Notes (Signed)
Pt ambulating to the restroom.  

## 2022-04-02 NOTE — Telephone Encounter (Signed)
Spoke to the pt and advised her to her to let the ER know we did not obtain an EKG today . The EMT did and he has that we do not

## 2022-04-02 NOTE — Progress Notes (Unsigned)
Enrolled for [employer] to mail a ZIO XT long term holter monitor to the patients address on file.  

## 2022-04-02 NOTE — ED Provider Notes (Signed)
[facility] EMERGENCY DEPARTMENT Provider Note   CSN: [id number] Arrival date & time: 04/02/22  9518     History  Chief Complaint  Patient presents with   Bradycardia    KATALEA Jenny [name] is a 81 y.o. female.  HPI     81 year old female comes in with chief complaint of bradycardia Patient has history of hypothyroidism, OSA, venous insufficiency, COPD.    Patient states that she has been having generalized weakness for the last 2 months, more severe as of late.  She is also having shortness of breath with exertion.  She went to see her PCP and they noted that her heart rate was in the 30s, she was sent to the emergency room.  Patient indicates that she has not had any chest pain, but she is having exertional shortness of breath  She denies any new medication.  There is no known history of coronary artery disease.  Home Medications Prior to Admission medications   Medication Sig Start Date End Date Taking? Authorizing Provider  albuterol (PROVENTIL) (2.5 MG/3ML) 0.083% nebulizer solution Take 3 mLs (2.5 mg total) by nebulization every 6 (six) hours as needed for wheezing or shortness of breath. 09/09/21   Parrett, Virgel Bouquet, NP  albuterol (VENTOLIN HFA) 108 (90 Base) MCG/ACT inhaler Inhale 1-2 puffs into the lungs every 6 (six) hours as needed. 12/16/21   Parrett, Virgel Bouquet, NP  ALPRAZolam (XANAX) 0.5 MG tablet TAKE 1/2 TO 1 TABLET BY MOUTH 3 TIMES DAILY AS NEEDED FOR ANXIETY 10/20/21   Sheliah Hatch, MD  amLODipine (NORVASC) 5 MG tablet TAKE 1 TABLET BY MOUTH  DAILY 10/30/21   Sheliah Hatch, MD  amoxicillin-clavulanate (AUGMENTIN) 875-125 MG tablet Take 1 tablet by mouth 2 (two) times daily. 09/15/21   Parrett, Virgel Bouquet, NP  benzonatate (TESSALON) 200 MG capsule Take 1 capsule (200 mg total) by mouth 3 (three) times daily as needed for cough. Patient not taking: Reported on 04/02/2022 09/09/21   Parrett, Virgel Bouquet, NP  budesonide-formoterol (SYMBICORT) 160-4.5  MCG/ACT inhaler Inhale 2 puffs into the lungs 2 (two) times daily. 08/22/21   Coralyn Helling, MD  budesonide-formoterol (SYMBICORT) 160-4.5 MCG/ACT inhaler Inhale 2 puffs into the lungs 2 (two) times daily. 09/09/21   Parrett, Virgel Bouquet, NP  cetirizine (ZYRTEC) 10 MG tablet Take 10 mg by mouth daily as needed.    [provider]  Dupilumab (DUPIXENT) 300 MG/2ML SOPN Inject 300 mg into the skin every 14 (fourteen) days. 09/30/21   Coralyn Helling, MD  Ergocalciferol (VITAMIN D2) 10 MCG (400 UNIT) TABS Take 1 1e11 Vector Genomes/kg by mouth once. Patient not taking: Reported on 04/02/2022    [provider]  escitalopram (LEXAPRO) 10 MG tablet TAKE 1 TABLET BY MOUTH DAILY 01/14/22   Sheliah Hatch, MD  fluticasone (FLONASE) 50 MCG/ACT nasal spray PLACE 1 SPRAY INTO BOTH NOSTRILS DAILY. 08/23/20   Coralyn Helling, MD  levothyroxine (SYNTHROID) 112 MCG tablet TAKE 1 TABLET BY MOUTH  DAILY 10/30/21   Sheliah Hatch, MD  methylPREDNISolone (MEDROL DOSEPAK) 4 MG TBPK tablet Take as directed. 09/09/21   Parrett, Virgel Bouquet, NP  pantoprazole (PROTONIX) 40 MG tablet TAKE 1 TABLET BY MOUTH EVERY DAY 30 MINUTES BEFORE DINNER 10/10/20   Coralyn Helling, MD  esomeprazole (NEXIUM) 40 MG capsule Take 40 mg by mouth daily before breakfast.    12/18/11  [provider]      Allergies    Prednisone, Codeine, and Tramadol  Review of Systems   Review of Systems  All other systems reviewed and are negative.   Physical Exam Updated Vital Signs BP 138/70   Pulse 63   Temp 97.9 F (36.6 C) (Oral)   Resp 18   SpO2 98%  Physical Exam Constitutional:      Appearance: She is well-developed.  HENT:     Head: Normocephalic and atraumatic.  Eyes:     Conjunctiva/sclera: Conjunctivae normal.     Pupils: Pupils are equal, round, and reactive to light.  Cardiovascular:     Rate and Rhythm: Normal rate.     Heart sounds: Normal heart sounds.  Pulmonary:     Effort: Pulmonary effort is normal.      Breath sounds: Rhonchi present.  Abdominal:     General: Bowel sounds are normal.     Palpations: Abdomen is soft.  Musculoskeletal:     Cervical back: Normal range of motion and neck supple.     Right lower leg: No edema.     Left lower leg: No edema.  Skin:    General: Skin is warm and dry.  Neurological:     Mental Status: She is alert and oriented to person, place, and time.     ED Results / Procedures / Treatments   Labs (all labs ordered are listed, but only abnormal results are displayed) Labs Reviewed  CBC WITH DIFFERENTIAL/PLATELET  LACTIC ACID, PLASMA  BASIC METABOLIC PANEL  MAGNESIUM  PHOSPHORUS  LACTIC ACID, PLASMA  TROPONIN I (HIGH SENSITIVITY)  TROPONIN I (HIGH SENSITIVITY)    EKG EKG Interpretation  Date/Time:  Thursday April 02 2022 09:50:03 EDT Ventricular Rate:  83 PR Interval:  150 QRS Duration: 91 QT Interval:  432 QTC Calculation: 394 R Axis:   -42 Text Interpretation: Sinus rhythm Ventricular bigeminy Probable left atrial enlargement Left axis deviation Abnormal R-wave progression, early transition in a pattern of bigeminy Confirmed by Derwood Kaplan ([id number]) on 04/02/2022 9:51:59 AM  Radiology No results found.  Procedures .Critical Care  Performed by: Derwood Kaplan, MD Authorized by: Derwood Kaplan, MD   Critical care provider statement:    Critical care time (minutes):  46   Critical care was necessary to treat or prevent imminent or life-threatening deterioration of the following conditions:  Circulatory failure   Critical care was time spent personally by me on the following activities:  Development of treatment plan with patient or surrogate, discussions with consultants, evaluation of patient's response to treatment, examination of patient, ordering and review of laboratory studies, ordering and review of radiographic studies, ordering and performing treatments and interventions, pulse oximetry, re-evaluation of patient's  condition, review of old charts, obtaining history from patient or surrogate and interpretation of cardiac output measurements     Medications Ordered in ED Medications - No data to display  ED Course/ Medical Decision Making/ A&P Clinical Course as of 04/02/22 1417  Thu Apr 02, 2022  1416 Patient's blood work is reassuring.  Results discussed with her.  Still awaiting cardiology evaluation.  EMS strip reviewed.  That also is showing bigeminy with heart rate in the 70s, meaning patient most likely was having a pulse in the 30s at that time.  It does not appear that the PCP office had done an EKG. I suspect that patient likely was in bigeminy at that time as well.  Telemetry monitoring here is reassuring. [AN]    Clinical Course User Index [AN] Derwood Kaplan, MD  Medical Decision Making Amount and/or Complexity of Data Reviewed Labs: ordered.   This patient presents to the ED with chief complaint(s) of low heart rate that was discovered at her PCP visit with pertinent past medical history of COPD, sleep apnea, hypothyroidism which further complicates the presenting complaint. The complaint involves an extensive differential diagnosis and also carries with it a high risk of complications and morbidity.    Currently, the EKG showing ventricular bigeminy followed by sinus rhythm with heart rate in the 60s  The differential diagnosis includes : ACS, sick sinus syndrome, bigeminy, AV block Low suspicion for medication side effects or overdose. Doubt hypothyroidism given the constellation of symptoms and the fact that patient is already on Synthroid. Patient is not on any beta-blockers.  She is on amlodipine, but this does not appear to be an overdose.  The initial plan is to order basic labs. The EKG here is showing bigeminy with pulse in the 40s. Likely the weakness she has been experiencing the last several weeks could be because of this bradycardia.  We  will discuss the case with cardiology service.   Additional history obtained: Additional history obtained from family Records reviewed Primary Care Documents.  Unfortunately, unable to find patient's EKG that was completed at the PCP visit  Independent labs interpretation:  The following labs were independently interpreted: Normal CBC, metabolic profile and troponin, [facility].   Treatment and Reassessment: Patient reassessed.  During our evaluation, she has not had any episodes of profound bradycardia.  She has had multiple episodes of bigeminy. Questioning if bigeminy was interpreted as bradycardia, with the PVC being nonconducting and not leading to pulse.   At this time, cardiology recommendations still pending  Consultation: - Consulted or discussed management/test interpretation w/ external professional: Cardiology team to evaluate the patient and decide if she needs admission to the hospital versus outpatient management.    Final Clinical Impression(s) / ED Diagnoses Final diagnoses:  Ventricular bigeminy    Rx / DC Orders ED Discharge Orders     None         Derwood Kaplan, MD 04/03/22 1545

## 2022-04-02 NOTE — ED Provider Notes (Signed)
Blood pressure (!) 150/78, pulse 67, temperature 97.9 F (36.6 C), temperature source Oral, resp. rate 20, SpO2 96 %.  Assuming care from Dr. Rhunette Croft.  In short, Jenny [name] is a 81 y.o. female with a chief complaint of Bradycardia .  Refer to the original H&P for additional details.  The current plan of care is to follow up after Cardiology consultation. Currently in NSR but Bigeminy earlier with bradycardia. Electrolytes are reassuring.  04:19 PM  Discussed case with cardiology after their evaluation.  They will opt for outpatient work-up.  Patient in sinus rhythm with normal blood pressure.  Can be discharged home with plan to follow-up with cardiology.     Maia Plan, MD 04/02/22 1620

## 2022-04-02 NOTE — Consult Note (Addendum)
Cardiology Consultation:   Patient ID: KIELEE CARE MRN: [id number]; DOB: 1941-10-04  Admit date: 04/02/2022 Date of Consult: 04/02/2022  PCP:  Midge Minium, MD   [place] Providers Cardiologist: New to Dr. Percival Spanish    Patient Profile:   Jenny [name] is a 81 y.o. female with history of hypertension, hypothyroidism and asthma who is being seen 04/02/2022 for the evaluation of fatigue, weakness and bradycardia at request of Dr. Laverta Baltimore.    Patient had remote stress test and echocardiogram which was reassuring.   Echo 03/2017 with normal LV function.  No regional wall motion abnormality.   30-pack-year tobacco history, quit 31 years ago.  History of Present Illness:   Ms. Brosious reported 2 to 3 months history of progressively worsening weakness.  Also has some spell where her weakness exacerbates with dizziness for few minutes with spontaneous resolution.  Reported progressive worsening exertional shortness of breath.  No chest pain, lower extremity edema, orthopnea, PND or syncope.  Also reports intermittent nausea with "fullness" at epigastric area.  Sometimes has upper sternal chest tightness.  Few days ago she had an episode of palpitation lasting for 1 hour.  Due to progressive worsening symptoms, she went to PCP office this morning.  She was noted bradycardic in 30s on pulse ox.  No EKG was done.  EMS was called.  She was found in ventricular bigeminy with sinus rhythm on EKG by EMS.  Per EMS run sheet she had a few episode of bradycardia in route followed by PVC but no strip available for review.  No bradycardia episode in ER so far.   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   Anemia 09/21/2011   Asthma 01/14/2011   Atrophic vaginitis 06/17/2012   BACK PAIN, LUMBAR 08/23/2007   DEGENERATIVE JOINT DISEASE  02/22/2008   Depression with anxiety 03/06/2008   Qualifier: Diagnosis of  By: Lenna Gilford MD, Deborra Medina    DIVERTICULOSIS OF COLON 02/22/2008   GERD (gastroesophageal reflux disease)    HYPERTENSION 02/22/2008   HYPOTHYROIDISM 08/23/2007   Interstitial lung disease ([place])    OBESITY 08/23/2007   Peripheral neuropathy 02/12/2011   possibly related to spondylolisthesis per pt   Pneumonia due to COVID-19 virus 09/2020   PONV (postoperative nausea and vomiting)    Sleep apnea    VENOUS INSUFFICIENCY 08/23/2007   VITAMIN D DEFICIENCY 08/21/2008    Past Surgical History:  Procedure Laterality Date   ABDOMINAL HYSTERECTOMY     took one ovary   AP repair and sling for cystocele  8-04    Dr Gaynelle Arabian   APPENDECTOMY     carpal tunel  2008   CHOLECYSTECTOMY     ctr     bilateral   EYE SURGERY     left hand  2010   left thumb, joint removal, CTR   NECK SURGERY     x 2 - discectomy, fusion   SHOULDER ARTHROSCOPY DISTAL CLAVICLE EXCISION AND OPEN ROTATOR CUFF REPAIR     right   TONSILLECTOMY AND ADENOIDECTOMY     TUBAL LIGATION        Inpatient Medications: Scheduled Meds:  Continuous Infusions:  PRN Meds:   Allergies:    Allergies  Allergen Reactions   Prednisone Other (See Comments)    Confusion, dizziness Medrol is ok   Other Other (See Comments)  Patient states that she cannot have any spicy foods   Codeine Nausea Only        Tramadol Itching    Social History:   Social History   Socioeconomic History   Marital status: Widowed    Spouse name: Not on file   Number of children: Not on file   Years of education: Not on file   Highest education level: Not on file  Occupational History   Occupation: Retired  Tobacco Use   Smoking status: Former    Packs/day: 1.50    Years: 30.00    Total pack years: 45.00    Types: Cigarettes    Quit date: 10/13/1991    Years since quitting: 30.4   Smokeless tobacco: Never  Vaping Use   Vaping Use: Never used  Substance  and Sexual Activity   Alcohol use: No    Alcohol/week: 0.0 standard drinks of alcohol   Drug use: No   Sexual activity: Never  Other Topics Concern   Not on file  Social History Narrative   Not on file   Social Determinants of Health   Financial Resource Strain: Medium Risk (01/15/2020)   Overall Financial Resource Strain (CARDIA)    Difficulty of Paying Living Expenses: Somewhat hard  Food Insecurity: No Food Insecurity (10/18/2020)   Hunger Vital Sign    Worried About Running Out of Food in the Last Year: Never true    [place] in the Last Year: Never true  Transportation Needs: No Transportation Needs (10/18/2020)   PRAPARE - Hydrologist (Medical): No    Lack of Transportation (Non-Medical): No  Physical Activity: Inactive (07/04/2021)   Exercise Vital Sign    Days of Exercise per Week: 0 days    Minutes of Exercise per Session: 0 min  Stress: No Stress Concern Present (04/01/2020)   [place]    Feeling of Stress : Not at all  Social Connections: Moderately Integrated (07/04/2021)   Social Connection and Isolation Panel [NHANES]    Frequency of Communication with Friends and Family: More than three times a week    Frequency of Social Gatherings with Friends and Family: More than three times a week    Attends Religious Services: 1 to 4 times per year    Active Member of [employer] or Organizations: Yes    Attends Archivist Meetings: 1 to 4 times per year    Marital Status: Widowed  Human resources officer Violence: Not on file    Family History:    Family History  Problem Relation Age of Onset   COPD Mother    Pneumonia Mother    Other Mother        CHF   Hypertension Mother    Arthritis Mother        septic knee after replacement   Heart disease Mother        chf   Dementia Father    Arthritis Brother    Asthma Daughter    Colon polyps Daughter    Stroke Maternal  Grandmother    Heart disease Maternal Grandmother    Other Maternal Grandfather        CHF   Heart disease Maternal Grandfather        chf   Cancer Paternal Grandmother        stomach?   Other Paternal Grandfather        problems with kidneys  Kidney disease Paternal Grandfather    Anxiety disorder Daughter    Cancer Maternal Aunt        breast   Cancer Maternal Aunt        liver   Colon cancer Neg Hx      ROS:  Please see the history of present illness.   All other ROS reviewed and negative.     Physical Exam/Data:   Vitals:   04/02/22 1400 04/02/22 1415 04/02/22 1430 04/02/22 1445  BP: 137/83 119/77 (!) 142/73 (!) 150/78  Pulse: 63 65 68 67  Resp: '17 18 19 20  '$ Temp:      TempSrc:      SpO2: 94% 97% 97% 96%   No intake or output data in the 24 hours ending 04/02/22 1601    04/02/2022    8:35 AM 09/09/2021   10:24 AM 07/29/2021    9:11 AM  Last 3 Weights  Weight (lbs) 210 lb 2 oz 213 lb 12.8 oz 218 lb  Weight (kg) 95.312 kg 96.979 kg 98.884 kg     There is no height or weight on file to calculate BMI.  General:  Well nourished, well developed, in no acute distress HEENT: normal Neck: no JVD Vascular: No carotid bruits; Distal pulses 2+ bilaterally Cardiac:  normal S1, S2; RRR; no murmur  Lungs:  clear to auscultation bilaterally, no wheezing, rhonchi or rales  Abd: soft, nontender, no hepatomegaly  Ext: no edema Musculoskeletal:  No deformities, BUE and BLE strength normal and equal Skin: warm and dry  Neuro:  CNs 2-12 intact, no focal abnormalities noted Psych:  Normal affect   EKG:  The ECG that was done today  was personally reviewed and demonstrates Sinus rhythm, ventricular bigeminy  Relevant CV Studies: Echo 03/16/2017 Study Conclusions   - Left ventricle: The cavity size was normal. Systolic function was    normal. The estimated ejection fraction was in the range of 60%    to 65%. Wall motion was normal; there were no regional wall    motion  abnormalities. Left ventricular diastolic function    parameters were normal.  - Mitral valve: Calcified annulus.     Laboratory Data:  High Sensitivity Troponin:   Recent Labs  Lab 04/02/22 1019 04/02/22 1234  TROPONINIHS 3 3     Chemistry Recent Labs  Lab 04/02/22 1019  NA 140  K 4.8  CL 104  CO2 28  GLUCOSE 104*  BUN 18  CREATININE 0.99  CALCIUM 9.0  MG 2.2  GFRNONAA 57*  ANIONGAP 8    No results for input(s): "PROT", "ALBUMIN", "AST", "ALT", "ALKPHOS", "BILITOT" in the last 168 hours. Lipids No results for input(s): "CHOL", "TRIG", "HDL", "LABVLDL", "LDLCALC", "CHOLHDL" in the last 168 hours.  Hematology Recent Labs  Lab 04/02/22 1019  WBC 6.6  RBC 4.75  HGB 14.4  HCT 44.6  MCV 93.9  MCH 30.3  MCHC 32.3  RDW 13.2  PLT 273   Thyroid No results for input(s): "TSH", "FREET4" in the last 168 hours.  BNPNo results for input(s): "BNP", "PROBNP" in the last 168 hours.  DDimer No results for input(s): "DDIMER" in the last 168 hours.   Radiology/Studies:  No results found.   Assessment and Plan:   Weakness Occasional palpitation Chest tightness Ventricular bigeminy/PVC Exertional shortness of breath   Patient with progressive worsening 2-3 months history of fatigue and weakness.  Few episode of dizziness with severe fatigue.  Few days ago had episode of palpitation.  Also reporting exertional shortness of breath and some chest tightness.  Noted bradycardic in 30s at PCP office on pulse ox however no documented strip.  EKG showed sinus rhythm with ventricular bigeminy.  No evidence of bradycardia on monitor so far.   Get TSH Plan outpatient echocardiogram and monitor. Follow up with Dr. Percival Spanish. Eventually she may need ischemic evaluation. 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     For questions or updates, please contact [facility] Please consult [email or web address] for contact info under    Jarrett Soho, PA   04/02/2022 4:01 PM   History and all data above reviewed.  Patient examined.  I agree with the findings as above.  The patient presented to discuss progressive fatigue.  This has been slowly getting worse.  No recent syncope.  She has some occasional dizziness.  She does not report palpitations and has had no sense of her heart beating.  She has had DOE but this as been improved with her pulmonary treatment.  She was able to push a lawn mower last week.  She did not have chest pain, neck or arm pain.  She did get tired and had to rest and take her inhaler afterward but she was able to push the mower.  She has poor sleep getting up every to hours to urinate.  She does not use her CPAP because of this.  In the primary care office HR was reported to be in the 30s.  Rhythm strip per EMS demonstrated NSR with ventricular bigeminy.  She did thi in the ED but did not feel this.   She has had no bradycardia on any strip, EKG or tele in the ED.  BPs have been normal.  The patient exam reveals COR:RRR  ,  Lungs: Clear  ,  Abd: Positive bowel sounds, no rebound no guarding, Ext No edema   .  All available labs, radiology testing, previous records reviewed. Agree with documented assessment and plan. Bradycardia:  HR in the 30s was likely undercounting of her ventricular bigeminy.  No evidence for significant bradycardia, pauses or sustained arrhythmia otherwise.  BP and LABS ok.  OK to go home with plans for 2 week monitor and follow up echo.  TSH has been drawn.  Check orthostatics before discharge.    SOB:  She says that she has had improvement with her asthma treatment.  She does have coronary calcium noted on a previous CT chest.  I will check the echo and have a low threshold for ischemia work up which might need to be PET.  She needs primary risk reduction.  Fatigue:  Possibly related to untreated sleep apnea.  I asked her to discuss this with Dr. Halford Chessman.   Jeneen Rinks Taytum Wheller  4:23 PM  04/02/2022

## 2022-04-02 NOTE — Telephone Encounter (Signed)
Dr from [facility] states they are not able to see an ekg was done at the office today. They stated if there is an ekg available, they would like it faxed to [phone].

## 2022-04-02 NOTE — Progress Notes (Signed)
   Subjective:    Patient ID: Jenny [name], female    DOB: Sep 19, 1941, 81 y.o.   MRN: [id number]  HPI Fatigue- pt came in today complaining of fatigue.  While Marcille Blanco was getting her vitals she found that 2 separate pulse oximeters were reading 35.  I checked her pulse manually at got 36.  I asked pt if she was short of breath and she said yes, she was having mild SOB.  At that time, she was placed on 2L O2 via [place] and EMS was called.  Pt reports she has had ongoing fatigue 'for months'.  States she had to rest while walking around the house.  Reports intermittent SOB.  Denies CP.   Review of Systems For ROS see HPI     Objective:   Physical Exam Vitals reviewed.  Constitutional:      General: She is not in acute distress.    Appearance: Normal appearance. She is obese. She is not ill-appearing.  HENT:     Head: Normocephalic and atraumatic.  Eyes:     Extraocular Movements: Extraocular movements intact.     Conjunctiva/sclera: Conjunctivae normal.  Cardiovascular:     Rate and Rhythm: Regular rhythm. Bradycardia present.     Heart sounds: Normal heart sounds. No murmur heard. Pulmonary:     Effort: Pulmonary effort is normal. No respiratory distress.     Breath sounds: Normal breath sounds. No wheezing.  Abdominal:     Palpations: Abdomen is soft.  Musculoskeletal:     Right lower leg: No edema.     Left lower leg: No edema.  Skin:    General: Skin is warm and dry.  Neurological:     General: No focal deficit present.     Mental Status: She is alert and oriented to person, place, and time.  Psychiatric:        Mood and Affect: Mood normal.        Behavior: Behavior normal.        Thought Content: Thought content normal.           Assessment & Plan:  Bradycardia- new.  Likely the cause of pt's fatigue.  As noted above, we had 3 HR readings of 35/36 and EMS was called.  Pt has mild SOB- improved w/ O2 via [place].  EMS monitor showed pt is in bigeminy.  Plan is to take  pt to [facility].  Daughter informed.

## 2022-04-06 DIAGNOSIS — I493 Ventricular premature depolarization: Secondary | ICD-10-CM

## 2022-04-06 DIAGNOSIS — R001 Bradycardia, unspecified: Secondary | ICD-10-CM | POA: Diagnosis not present

## 2022-04-06 DIAGNOSIS — I498 Other specified cardiac arrhythmias: Secondary | ICD-10-CM | POA: Diagnosis not present

## 2022-04-24 ENCOUNTER — Other Ambulatory Visit: Payer: Self-pay | Admitting: Adult Health

## 2022-04-27 ENCOUNTER — Ambulatory Visit (HOSPITAL_COMMUNITY): Payer: Medicare Other | Attending: Internal Medicine

## 2022-04-27 DIAGNOSIS — R0609 Other forms of dyspnea: Secondary | ICD-10-CM | POA: Insufficient documentation

## 2022-04-27 DIAGNOSIS — I498 Other specified cardiac arrhythmias: Secondary | ICD-10-CM | POA: Insufficient documentation

## 2022-04-27 DIAGNOSIS — I493 Ventricular premature depolarization: Secondary | ICD-10-CM | POA: Insufficient documentation

## 2022-04-27 DIAGNOSIS — R531 Weakness: Secondary | ICD-10-CM | POA: Insufficient documentation

## 2022-04-30 DIAGNOSIS — I493 Ventricular premature depolarization: Secondary | ICD-10-CM | POA: Diagnosis not present

## 2022-04-30 DIAGNOSIS — I498 Other specified cardiac arrhythmias: Secondary | ICD-10-CM | POA: Diagnosis not present

## 2022-04-30 DIAGNOSIS — R001 Bradycardia, unspecified: Secondary | ICD-10-CM | POA: Diagnosis not present

## 2022-05-14 ENCOUNTER — Telehealth: Payer: Self-pay | Admitting: Adult Health

## 2022-05-14 LAB — ECHOCARDIOGRAM COMPLETE
Area-P 1/2: 2.57 cm2
S' Lateral: 2.5 cm

## 2022-05-14 MED ORDER — BUDESONIDE-FORMOTEROL FUMARATE 160-4.5 MCG/ACT IN AERO: 2.0000 | INHALATION_SPRAY | Freq: Two times a day (BID) | RESPIRATORY_TRACT | 11 refills | Status: DC

## 2022-05-14 NOTE — Telephone Encounter (Signed)
Refills sent in to [facility]. Nothing further needed

## 2022-05-20 DIAGNOSIS — I498 Other specified cardiac arrhythmias: Secondary | ICD-10-CM | POA: Insufficient documentation

## 2022-05-20 NOTE — Progress Notes (Signed)
Cardiology Office Note   Date:  05/22/2022   ID:  Jenny [name], Jenny [name] 1941-04-16, MRN [id number]  PCP:  Midge Minium, MD  Cardiologist:   None   Chief Complaint  Patient presents with   Palpitations      History of Present Illness: Jenny [name] is a 81 y.o. female with history of hypertension, hypothyroidism and asthma who was seen 04/02/2022 in the ED for the evaluation of fatigue, weakness and bradycardia .  She had ventricular bigeminy but no sustained arrhythmias in the ED.  Labs were OK.  She wore a monitor with no sustained bradycardia and with rare runs of SVT but with ventricular bigeminy.  Echo as an out patient demonstrated no acute abnormalities.    She returns for follow up.  In the emergency room she did not report palpitations but she did have some dizziness and fatigue and shortness of breath.  There was some occasional chest discomfort.  She did have coronary calcium noted on CT previously.   She returns today and she says she is feeling fine.  She is not having any palpitations except for very rare skipping beats.  She does not have presyncope or syncope.  She says that her breathing is very good and she denies any shortness of breath though she mention this in the hospital.  She says she is not getting any chest pressure, neck or arm discomfort and she remains active.  She thinks she had to take vitamin D.  She thinks she been on a lot of stress because one of her grandchildren died not long ago and another 1 had a stroke.   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[place])    OBESITY 08/23/2007   Peripheral neuropathy 02/12/2011   possibly related to spondylolisthesis per pt   Pneumonia due to COVID-19 virus 09/2020   PONV (postoperative nausea and vomiting)    Sleep apnea    VENOUS INSUFFICIENCY 08/23/2007   VITAMIN D DEFICIENCY 08/21/2008    Past Surgical History:  Procedure Laterality Date   ABDOMINAL HYSTERECTOMY     took one ovary   AP repair and sling for cystocele  8-04    Dr Gaynelle Arabian   APPENDECTOMY     carpal tunel  2008   CHOLECYSTECTOMY     ctr     bilateral   EYE SURGERY     left hand  2010   left thumb, joint removal, CTR   NECK SURGERY     x 2 - discectomy, fusion   SHOULDER ARTHROSCOPY DISTAL CLAVICLE EXCISION AND OPEN ROTATOR CUFF REPAIR     right   TONSILLECTOMY AND ADENOIDECTOMY     TUBAL LIGATION       Current Outpatient Medications  Medication Sig Dispense Refill   albuterol (PROVENTIL) (2.5 MG/3ML) 0.083% nebulizer solution Take 3 mLs (2.5 mg total) by nebulization every 6 (six) hours as needed for wheezing or shortness of breath.     albuterol (VENTOLIN HFA) 108 (90 Base) MCG/ACT inhaler  INHALE 1-2 PUFFS INTO THE LUNGS EVERY 6 HOURS AS NEEDED. 8.5 each 0   ALPRAZolam (XANAX) 0.5 MG tablet TAKE 1/2 TO 1 TABLET BY MOUTH 3 TIMES DAILY AS NEEDED FOR ANXIETY 90 tablet 1   amLODipine (NORVASC) 5 MG tablet TAKE 1 TABLET BY MOUTH  DAILY 90 tablet 3   budesonide-formoterol (SYMBICORT) 160-4.5 MCG/ACT inhaler Inhale 2 puffs into the lungs 2 (two) times daily. 30.6 g 11   Dupilumab (DUPIXENT) 300 MG/2ML SOPN Inject 300 mg into the skin every 14 (fourteen) days. 12 mL 0   escitalopram (LEXAPRO) 10 MG tablet TAKE 1 TABLET BY MOUTH DAILY 90 tablet 3   fluticasone (FLONASE) 50 MCG/ACT nasal spray PLACE 1 SPRAY INTO BOTH NOSTRILS DAILY. 48 mL 1   ibuprofen (ADVIL) 200 MG tablet Take 200 mg by mouth every 6 (six) hours as needed.     levothyroxine (SYNTHROID)  112 MCG tablet TAKE 1 TABLET BY MOUTH  DAILY 90 tablet 3   meclizine (ANTIVERT) 25 MG tablet TAKE 1 TABLET BY MOUTH 3 TIMES A DAY AS NEEDED FOR DIZZINESS     pantoprazole (PROTONIX) 40 MG tablet TAKE 1 TABLET BY MOUTH EVERY DAY 30 MINUTES BEFORE DINNER 90 tablet 3   benzonatate (TESSALON) 200 MG capsule Take 1 capsule (200 mg total) by mouth 3 (three) times daily as needed for cough. (Patient not taking: Reported on 05/22/2022) 45 capsule 0   No current facility-administered medications for this visit.    Allergies:   Prednisone, Other, Codeine, and Tramadol     ROS:  Please see the history of present illness.   Otherwise, review of systems are positive for none.   All other systems are reviewed and negative.    PHYSICAL EXAM: VS:  BP (!) 146/71   Pulse 74   Ht '5\' 4"'$  (1.626 m)   Wt 206 lb (93.4 kg)   SpO2 97%   BMI 35.36 kg/m  , BMI Body mass index is 35.36 kg/m. GENERAL:  Well appearing HEENT:  Pupils equal round and reactive, fundi not visualized, oral mucosa unremarkable NECK:  No jugular venous distention, waveform within normal limits, carotid upstroke brisk and symmetric, no bruits, no thyromegaly LYMPHATICS:  No cervical, inguinal adenopathy LUNGS:  Clear to auscultation bilaterally BACK:  No CVA tenderness CHEST:  Unremarkable HEART:  PMI not displaced or sustained,S1 and S2 within normal limits, no S3, no S4, no clicks, no rubs, no murmurs ABD:  Flat, positive bowel sounds normal in frequency in pitch, no bruits, no rebound, no guarding, no midline pulsatile mass, no hepatomegaly, no splenomegaly EXT:  2 plus pulses throughout, no edema, no cyanosis no clubbing SKIN:  No rashes no nodules NEURO:  Cranial nerves II through XII grossly intact, motor grossly intact throughout PSYCH:  Cognitively intact, oriented to person place and time    EKG:  EKG is not ordered today.   Recent Labs: 04/02/2022: BUN 18; Creatinine, Ser 0.99; Hemoglobin 14.4; Magnesium 2.2; Platelets  273; Potassium 4.8; Sodium 140; TSH 1.300    Lipid Panel    Component Value Date/Time   CHOL 131 04/09/2021 1407   TRIG 127.0 04/09/2021 1407   HDL 37.90 (L) 04/09/2021 1407   CHOLHDL 3 04/09/2021 1407   VLDL 25.4 04/09/2021 1407   LDLCALC 68 04/09/2021 1407      Wt Readings from Last 3 Encounters:  05/22/22 206 lb (93.4 kg)  04/02/22 210 lb 2 oz (95.3 kg)  09/09/21 213 lb 12.8 oz (97 kg)  Other studies Reviewed: Additional studies/ records that were reviewed today include: Echo, and monitor. Review of the above records demonstrates:  Please see elsewhere in the note.     ASSESSMENT AND PLAN:  VENTRICULAR ECTOPY: There were no sustained arrhythmias on the monitor.  She did have some ventricular ectopy with bigeminy.  However, there is no bradycardia arrhythmia requiring pacing.  She is having no further symptoms.  No change in therapy.  CHEST PAIN: She denies any chest discomfort.  No further cardiovascular testing.  She says she is active and not bring on symptoms.  We reviewed symptoms that she described in the hospital and she is not having these.  She had a normal echo.  FATIGUE: She has untreated sleep apnea.  She is going to try to start using the mask.  Current medicines are reviewed at length with the patient today.  The patient does not have concerns regarding medicines.  The following changes have been made:  no change  Labs/ tests ordered today include: None No orders of the defined types were placed in this encounter.    Disposition:   FU with me as needed.      Signed, Minus Breeding, MD  05/22/2022 9:55 AM    [place]

## 2022-05-20 NOTE — Progress Notes (Signed)
Chronic Care Management Pharmacy Note Summary: FU with patient - she is having some fatigue.  History of Vitamin D deficiency.  No other concerns with medications BP has been controlled.  Recommendations: Patient to schedule FU for labs - treat Vit d if needed  FU 6 months  06/03/2022 Name:  Jenny [name] MRN:  [id number] DOB:  [birth year]  Subjective: Jenny [name] is an 81 y.o. year old female who is a primary patient of Tabori, [name], MD.  The CCM team was consulted for assistance with disease management and care coordination needs.    Engaged with patient by telephone for follow up visit in response to provider referral for pharmacy case management and/or care coordination services.   Consent to Services:  The patient was given information about Chronic Care Management services, agreed to services, and gave verbal consent prior to initiation of services.  Please see initial visit note for detailed documentation.   Patient Care Team: Midge Minium, MD as PCP - General (Family Medicine) Noralee Space, MD as Consulting Physician (Pulmonary Disease) Ladene Artist, MD as Consulting Physician (Gastroenterology) Garrel Ridgel, DPM as Consulting Physician (Podiatry) Dian Queen, MD as Consulting Physician (Obstetrics and Gynecology) Edythe Clarity, [facility] (Pharmacist)  Objective:  Lab Results  Component Value Date   CREATININE 0.99 04/02/2022   CREATININE 0.91 04/09/2021   CREATININE 0.96 10/04/2020    Lab Results  Component Value Date   HGBA1C 6.2 (H) 09/30/2020   Last diabetic Eye exam: No results found for: "HMDIABEYEEXA"  Last diabetic Foot exam: No results found for: "HMDIABFOOTEX"      Component Value Date/Time   CHOL 131 04/09/2021 1407   TRIG 127.0 04/09/2021 1407   HDL 37.90 (L) 04/09/2021 1407   CHOLHDL 3 04/09/2021 1407   VLDL 25.4 04/09/2021 1407   [place] 68 04/09/2021 1407       Latest Ref Rng & Units 04/09/2021    2:07  PM 10/04/2020    4:30 AM 10/03/2020   10:28 AM  Hepatic Function  Total Protein 6.0 - 8.3 g/dL 6.4  6.2  6.7   Albumin 3.5 - 5.2 g/dL 4.0  3.3  3.6   AST 0 - 37 U/L 14  31  31    ALT 0 - 35 U/L 9  37  26   Alk Phosphatase 39 - 117 U/L 83  73  70   Total Bilirubin 0.2 - 1.2 mg/dL 0.7  0.4  0.5   Bilirubin, Direct 0.0 - 0.3 mg/dL 0.2       Lab Results  Component Value Date/Time   TSH 1.300 04/02/2022 04:09 PM   TSH 1.60 04/09/2021 02:07 PM   TSH 3.66 12/05/2019 09:43 AM   FREET4 1.45 08/03/2013 10:56 AM   FREET4 1.05 02/08/2013 09:39 AM       Latest Ref Rng & Units 04/02/2022   10:19 AM 04/09/2021    2:07 PM 10/04/2020    4:30 AM  CBC  WBC 4.0 - 10.5 K/uL 6.6  9.8  7.6   Hemoglobin 12.0 - 15.0 g/dL 14.4  13.4  14.3   Hematocrit 36.0 - 46.0 % 44.6  40.3  43.9   Platelets 150 - 400 K/uL 273  270.0  226     Lab Results  Component Value Date/Time   VD25OH 24.64 (L) 04/09/2021 02:07 PM   VD25OH 17.35 (L) 11/09/2019 09:31 AM   Clinical ASCVD:  The ASCVD Risk score (Arnett DK, et al.,  2019) failed to calculate for the following reasons:   The 2019 ASCVD risk score is only valid for ages 37 to 67    Social History   Tobacco Use  Smoking Status Former   Packs/day: 1.50   Years: 30.00   Total pack years: 45.00   Types: Cigarettes   Quit date: 10/13/1991   Years since quitting: 30.6  Smokeless Tobacco Never   BP Readings from Last 3 Encounters:  05/22/22 (!) 146/71  04/02/22 (!) 145/74  04/02/22 128/80   Pulse Readings from Last 3 Encounters:  05/22/22 74  04/02/22 63  04/02/22 (!) 36   Wt Readings from Last 3 Encounters:  05/22/22 206 lb (93.4 kg)  04/02/22 210 lb 2 oz (95.3 kg)  09/09/21 213 lb 12.8 oz (97 kg)    Assessment: Review of patient past medical history, allergies, medications, health status, including review of consultants reports, laboratory and other test data, was performed as part of comprehensive evaluation and provision of chronic care  management services.   SDOH:  (Social Determinants of Health) assessments and interventions performed: Yes  Financial Resource Strain: Low Risk  (06/03/2022)   Overall Financial Resource Strain (CARDIA)    Difficulty of Paying Living Expenses: Not very hard   Food Insecurity: No Food Insecurity (06/03/2022)   Hunger Vital Sign    Worried About Running Out of Food in the Last Year: Never true    Ran Out of Food in the Last Year: Never true     CCM Care Plan  Allergies  Allergen Reactions   Prednisone Other (See Comments)    Confusion, dizziness Medrol is ok   Other Other (See Comments)    Patient states that she cannot have any spicy foods   Codeine Nausea Only        Tramadol Itching    Medications Reviewed Today     Reviewed by Edythe Clarity, [facility] (Pharmacist) on 06/03/22 at 1445  Med List Status: <None>   Medication Order Taking? Sig Documenting Provider Last Dose Status Informant  albuterol (PROVENTIL) (2.5 MG/3ML) 0.083% nebulizer solution [id number] Yes Take 3 mLs (2.5 mg total) by nebulization every 6 (six) hours as needed for wheezing or shortness of breath. Parrett, Fonnie Mu, NP Taking Active Self, Pharmacy Records  albuterol (VENTOLIN HFA) 108 (90 Base) MCG/ACT inhaler [id number] Yes INHALE 1-2 PUFFS INTO THE LUNGS EVERY 6 HOURS AS NEEDED. Tanda Rockers, MD Taking Active   ALPRAZolam Duanne Moron) 0.5 MG tablet [id number] Yes TAKE 1/2 TO 1 TABLET BY MOUTH 3 TIMES DAILY AS NEEDED FOR ANXIETY Midge Minium, MD Taking Active   amLODipine (NORVASC) 5 MG tablet [id number] Yes TAKE 1 TABLET BY MOUTH  DAILY Midge Minium, MD Taking Active Self, Pharmacy Records  benzonatate (TESSALON) 200 MG capsule [id number] Yes Take 1 capsule (200 mg total) by mouth 3 (three) times daily as needed for cough. Parrett, Fonnie Mu, NP Taking Active Self, Pharmacy Records  budesonide-formoterol [facility]) 160-4.5 MCG/ACT inhaler [id number] Yes Inhale 2 puffs into the lungs 2 (two) times  daily. Parrett, Fonnie Mu, NP Taking Active   Dupilumab (DUPIXENT) 300 MG/2ML SOPN [id number] Yes Inject 300 mg into the skin every 14 (fourteen) days. Chesley Mires, MD Taking Active Self, Pharmacy Records  escitalopram (LEXAPRO) 10 MG tablet [id number] Yes TAKE 1 TABLET BY MOUTH DAILY Midge Minium, MD Taking Active Self, Pharmacy Records    Discontinued 12/18/11 [phone] (Error)            Med Note (  [place] [facility]   Fri Dec 18, 2011  8:42 AM) Dr Marlyce Huge put pt on Zantac  fluticasone (FLONASE) 50 MCG/ACT nasal spray [id number] Yes PLACE 1 SPRAY INTO BOTH NOSTRILS DAILY. Chesley Mires, MD Taking Active Self, Pharmacy Records           Med Note Broadus John, [facility]   Mon Sep 30, 2020  3:55 PM)    ibuprofen (ADVIL) 200 MG tablet [id number] Yes Take 200 mg by mouth every 6 (six) hours as needed. 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   Acute asthmatic bronchitis 09/09/2021   COVID-19 virus infection 09/30/2020   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   Extrinsic asthma without status asthmaticus 09/25/2020   Abnormal CT of the chest 04/18/2020   Colon cancer screening 06/15/2016   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   Hyperglycemia 07/07/2015   Annual physical exam 08/06/2013   Atrophic vaginitis 06/17/2012   Peripheral vascular disease, unspecified ([place]) 06/02/2012   Urticaria 10/15/2011   Anemia 09/21/2011    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   Vitamin D deficiency 02/12/2011   Peripheral neuropathy 02/12/2011   Intertrigo 33/82/5053   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   History of chicken pox 01/14/2011   History of measles 01/14/2011   OTHER URINARY INCONTINENCE 03/12/2010   Depression with anxiety 03/06/2008   Essential hypertension 02/22/2008   Chronic bronchitis ([place]) 02/22/2008   DIVERTICULOSIS OF COLON 02/22/2008   Unilateral primary osteoarthritis, left knee 02/22/2008   Hypothyroidism 08/23/2007   Morbid obesity ([place]) 08/23/2007   OSA on CPAP 08/23/2007   Venous (peripheral) insufficiency 08/23/2007   Allergic rhinitis 08/23/2007   GERD without esophagitis 08/23/2007   BACK PAIN, LUMBAR 08/23/2007    Immunization History  Administered Date(s) Administered   Fluad Quad(high Dose 65+) 08/02/2019, 08/05/2020, 07/05/2021   Influenza Split 08/20/2011, 09/30/2012   Influenza Whole 08/13/2013   Influenza, High Dose Seasonal PF 10/13/2014, 09/03/2015, 06/03/2017, 09/25/2018   Influenza-Unspecified 07/11/2016, 07/06/2021   Moderna Sars-Covid-2 Vaccination 11/17/2019, 12/24/2019   Pneumococcal Conjugate-13 03/16/2014   Pneumococcal Polysaccharide-23 06/23/2016   Tdap 09/22/2011   Zoster Recombinat (Shingrix) 03/16/2014   Zoster, Live 03/16/2014    Conditions to be addressed/monitored: HTN, HLD, COPD, and Depression  Care Plan : [place]  Updates made by Edythe Clarity, RPH since 06/03/2022 12:00 AM     Problem: HTN, HLD, COPD, and Depression   Priority: High  Onset Date: 05/07/2021     Long-Range Goal: Disease Management   Start Date: 05/07/2021  Expected End Date: 08/22/2022  Recent Progress: On track  Priority: High  Note:    Current Barriers:  Fatigue, possible vitamin D Deficiency  Pharmacist Clinical Goal(s):  Patient will verbalize ability to afford treatment regimen contact provider office for  questions/concerns as evidenced notation of same in electronic health record through collaboration with PharmD and provider.  Interventions: 1:1 collaboration with Midge Minium, MD regarding development and update of comprehensive plan of care as evidenced by provider attestation and co-signature Inter-disciplinary care team collaboration (see longitudinal plan of care) Comprehensive medication review performed; medication list updated in electronic medical record   Hypertension(BP goal <140/90) 06/03/22 -Controlled, has not checked at home -OSA on CPAP - no issues with compliance -Current treatment: Amlodipine 5 mg once daily Appropriate, Effective, Safe, Accessible -Current exercise: same se previous -Denies hypotensive/hypertensive symptoms -Educated on BP goals and benefits of medications for prevention of heart attack, stroke and kidney damage; -Counseled to monitor BP at home 1-2x/week, document, and provide log at future appointments -Recommended to continue current medication -Denies any dizziness or HA.  She is having some fatigue.  Hx of low Vitamin D she has not had levels checked in over a year.  Update 12/03/21 120/77 P 70, 117/68 P 40 Patient did report a BP reading with 696E systolic but thinks it was her BP meter.  She has changed her batteries and reporting more normal readings as of today. She does mention she has had headache lately. Have asked her to continue to monitor BP and report any elevated readings to me. She mentions she is having some swelling in her legs which could be related to amlodipine.  Have asked her to monitor her weight to make sure she is not retaining too much fluid. She requests refill on lasix which she is out of. Encouraged her to come in for visit if headache continues or BP remains becomes elevated at home. No other changes at this time.   Vitamin D Deficiency (Goal: Vitamin D level > 30) 06/03/22 -Query Controlled -Current treatment   None -Medications previously tried: Vitamin D 50,000 IU weekly x 12 weeks. -Took this for a year but did not supplement after.  Due for rechecked vitamin D.  Patient said she was going to call and reschedule these lab visits.  -Check Vitamin D - treat accordingly  Depression (Goal: minimize symptoms) -Controlled, not assessed Feels there is ongoing benefit with escitalopram 5 mg, but does note some ongoing symptoms of depression. Often thinks about loss of grandson, who had lived with patient. He passed away 9 months ago.  -Current treatment: Escitalopram 10 mg once daily Appropriate, Effective, Safe, Accessible Xanax 0.5 mg - 1/2 to 1 tablet tid Appropriate, Effective, Safe, Accessible -PHQ9: 10 -Educated on Benefits of medication for symptom control -Recommended to continue current medication Agreed to increase celexa to 10 mg daily - sent to mail order pharmacy. Will double up on escitalopram 5 mg (two tabs daily) until escitalopram 10 mg comes in through mail order. CPA pt call two weeks to review tolerability. PHQ9/GAD7 next month.   Update 12/03/21 Patient mood seems to be improving from a tough year where she lost her grandson. Continues on increased dose of Lexapro with no concerns, no changes needed at this time. 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 Had visit with Dr Halford Chessman recently and plan to possible deescalate Symbicort next OV.  -Current treatment  Symbicort 160/45 2 puffs BID (PAP) Dupixent 300 mg injection every 14 days (PAP) -Exacerbations requiring treatment in last 6 months: 0 -Patient denies consistent use of maintenance inhaler -Frequency of rescue inhaler use: n/a -Counseled on Proper inhaler technique; Benefits of consistent maintenance inhaler use -Recommended to continue current medication Assessed  patient finances. Will be automatically renewed for AZ&ME PAP - however pulm will need to send new erx - [place] [facility] [place] contracted mail order pharmacy. Messaged office.    Patient Goals/Self-Care Activities Patient will: - take medications as prescribed collaborate with provider on medication access solutions          Compliance/Adherence/Medication fill history: Care Gaps: None  Star-Rating Drugs: N/A  Medication Assistance: None required.  Patient affirms current coverage meets needs.  Patient's preferred pharmacy is:  [employer] ([place], [place] [facility] [address] [place] [address] Phone: [phone] Fax: [place], [address] [place] [address] Phone: [phone] Fax: [place], [place] [address] [place] [address] Phone: [phone] Fax: [phone]  [id number] [place] - [address] [place] [address]Phone: [phone]: [phone] [facility] [address] [address]Phone: [phone]: [phone]  Follow Up:  Patient agrees to Care Plan and Follow-up.  Plan:  3 month CMA FU, 6 month PharmD  No future appointments.    CBeverly Milch PharmD Clinical Pharmacist  [facility]([phone]

## 2022-05-22 ENCOUNTER — Encounter: Payer: Self-pay | Admitting: Cardiology

## 2022-05-22 ENCOUNTER — Ambulatory Visit: Payer: Medicare Other | Admitting: Cardiology

## 2022-05-22 VITALS — BP 146/71 | HR 74 | Ht 64.0 in | Wt 206.0 lb

## 2022-05-22 DIAGNOSIS — I498 Other specified cardiac arrhythmias: Secondary | ICD-10-CM

## 2022-05-22 DIAGNOSIS — R5383 Other fatigue: Secondary | ICD-10-CM

## 2022-05-22 DIAGNOSIS — R072 Precordial pain: Secondary | ICD-10-CM

## 2022-05-22 NOTE — Patient Instructions (Signed)
Medication Instructions:  Your Physician recommend you continue on your current medication as directed.    *If you need a refill on your cardiac medications before your next appointment, please call your pharmacy*   Lab Work: None ordered today   Testing/Procedures: None ordered today   Follow-Up: At [facility], you and your health needs are our priority.  As part of our continuing mission to provide you with exceptional heart care, we have created designated Provider Care Teams.  These Care Teams include your primary Cardiologist (physician) and Advanced Practice Providers (APPs -  Physician Assistants and Nurse Practitioners) who all work together to provide you with the care you need, when you need it.  We recommend signing up for the patient portal called "MyChart".  Sign up information is provided on this After Visit Summary.  MyChart is used to connect with patients for Virtual Visits (Telemedicine).  Patients are able to view lab/test results, encounter notes, upcoming appointments, etc.  Non-urgent messages can be sent to your provider as well.   To learn more about what you can do with MyChart, go to [email or web address].    Your next appointment:   As needed  The format for your next appointment:   In Person  Provider:   Dr. Hochrein    

## 2022-05-26 ENCOUNTER — Other Ambulatory Visit: Payer: Self-pay | Admitting: Family Medicine

## 2022-05-28 ENCOUNTER — Other Ambulatory Visit: Payer: Self-pay | Admitting: Pulmonary Disease

## 2022-06-03 ENCOUNTER — Ambulatory Visit (INDEPENDENT_AMBULATORY_CARE_PROVIDER_SITE_OTHER): Payer: Medicare Other | Admitting: Pharmacist

## 2022-06-03 DIAGNOSIS — I10 Essential (primary) hypertension: Secondary | ICD-10-CM

## 2022-06-03 DIAGNOSIS — E559 Vitamin D deficiency, unspecified: Secondary | ICD-10-CM

## 2022-06-03 NOTE — Patient Instructions (Addendum)
Visit Information   Goals Addressed             This Visit's Progress    Track and Manage My Blood Pressure-Hypertension   On track    Timeframe:  Long-Range Goal Priority:  High Start Date:   12/03/21                          Expected End Date: 06/02/22                      Follow Up Date 04/02/22    - check blood pressure daily - choose a place to take my blood pressure (home, clinic or office, retail store) - write blood pressure results in a log or diary    Why is this important?   You won't feel high blood pressure, but it can still hurt your blood vessels.  High blood pressure can cause heart or kidney problems. It can also cause a stroke.  Making lifestyle changes like losing a little weight or eating less salt will help.  Checking your blood pressure at home and at different times of the day can help to control blood pressure.  If the doctor prescribes medicine remember to take it the way the doctor ordered.  Call the office if you cannot afford the medicine or if there are questions about it.     Notes:        Patient Care Plan: CCM Pharmacy Care Plan     Problem Identified: HTN, HLD, COPD, and Depression   Priority: High  Onset Date: 05/07/2021     Long-Range Goal: Disease Management   Start Date: 05/07/2021  Expected End Date: 08/22/2022  Recent Progress: On track  Priority: High  Note:    Current Barriers:  Fatigue, possible vitamin D Deficiency  Pharmacist Clinical Goal(s):  Patient will verbalize ability to afford treatment regimen contact provider office for questions/concerns as evidenced notation of same in electronic health record through collaboration with PharmD and provider.   Interventions: 1:1 collaboration with Midge Minium, MD regarding development and update of comprehensive plan of care as evidenced by provider attestation and co-signature Inter-disciplinary care team collaboration (see longitudinal plan of care) Comprehensive  medication review performed; medication list updated in electronic medical record   Hypertension(BP goal <140/90) 06/03/22 -Controlled, has not checked at home -OSA on CPAP - no issues with compliance -Current treatment: Amlodipine 5 mg once daily Appropriate, Effective, Safe, Accessible -Current exercise: same se previous -Denies hypotensive/hypertensive symptoms -Educated on BP goals and benefits of medications for prevention of heart attack, stroke and kidney damage; -Counseled to monitor BP at home 1-2x/week, document, and provide log at future appointments -Recommended to continue current medication -Denies any dizziness or HA.  She is having some fatigue.  Hx of low Vitamin D she has not had levels checked in over a year.  Update 12/03/21 120/77 P 70, 117/68 P 40 Patient did report a BP reading with 962I systolic but thinks it was her BP meter.  She has changed her batteries and reporting more normal readings as of today. She does mention she has had headache lately. Have asked her to continue to monitor BP and report any elevated readings to me. She mentions she is having some swelling in her legs which could be related to amlodipine.  Have asked her to monitor her weight to make sure she is not retaining too much fluid. She requests refill  on lasix which she is out of. Encouraged her to come in for visit if headache continues or BP remains becomes elevated at home. No other changes at this time.   Vitamin D Deficiency (Goal: Vitamin D level > 30) 06/03/22 -Query Controlled -Current treatment  None -Medications previously tried: Vitamin D 50,000 IU weekly x 12 weeks. -Took this for a year but did not supplement after.  Due for rechecked vitamin D.  Patient said she was going to call and reschedule these lab visits.  -Check Vitamin D - treat accordingly  Depression (Goal: minimize symptoms) -Controlled, not assessed Feels there is ongoing benefit with escitalopram 5 mg, but does  note some ongoing symptoms of depression. Often thinks about loss of grandson, who had lived with patient. He passed away 9 months ago.  -Current treatment: Escitalopram 10 mg once daily Appropriate, Effective, Safe, Accessible Xanax 0.5 mg - 1/2 to 1 tablet tid Appropriate, Effective, Safe, Accessible -PHQ9: 10 -Educated on Benefits of medication for symptom control -Recommended to continue current medication Agreed to increase celexa to 10 mg daily - sent to mail order pharmacy. Will double up on escitalopram 5 mg (two tabs daily) until escitalopram 10 mg comes in through mail order. CPA pt call two weeks to review tolerability. PHQ9/GAD7 next month.   Update 12/03/21 Patient mood seems to be improving from a tough year where she lost her grandson. Continues on increased dose of Lexapro with no concerns, no changes needed at this time. 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 Had visit with Dr Halford Chessman recently and plan to possible deescalate Symbicort next OV.  -Current treatment  Symbicort 160/45 2 puffs BID (PAP) Dupixent 300 mg injection every 14 days (PAP) -Exacerbations requiring treatment in last 6 months: 0 -Patient denies consistent use of maintenance inhaler -Frequency of rescue inhaler use: n/a -Counseled on Proper inhaler technique; Benefits of consistent maintenance inhaler use -Recommended to continue current medication Assessed patient finances. Will be automatically renewed for AZ&ME PAP - however pulm will need to send new erx - [place] [facility] [place] contracted mail order pharmacy. Messaged office.    Patient Goals/Self-Care Activities Patient will: - take medications as prescribed collaborate with provider on medication access solutions           The patient verbalized understanding of  instructions, educational materials, and care plan provided today and DECLINED offer to receive copy of patient instructions, educational materials, and care plan.  Telephone follow up appointment with pharmacy team member scheduled for: 6 months  Edythe Clarity, [place], PharmD Clinical Pharmacist  [facility] [phone]

## 2022-06-11 DIAGNOSIS — I10 Essential (primary) hypertension: Secondary | ICD-10-CM

## 2022-06-11 DIAGNOSIS — J449 Chronic obstructive pulmonary disease, unspecified: Secondary | ICD-10-CM

## 2022-06-16 ENCOUNTER — Telehealth: Payer: Self-pay

## 2022-06-16 NOTE — Telephone Encounter (Signed)
I called and sw pt. She had Duralone injection 01/02/22 and advised that for auth to be obtained can not request until we are 6 months and 1 day out from previous injection. She said that this was fine and that she would hold on making an appt until after auth was obtained to address both her knee and her hip. Ok to send to April for repeat gel injection?

## 2022-06-16 NOTE — Telephone Encounter (Signed)
Sounds like a great plan to me, thank you

## 2022-06-17 NOTE — Telephone Encounter (Signed)
Noted.  Next available gel injection would need to be after 07/05/2022. Will submit.

## 2022-06-17 NOTE — Telephone Encounter (Signed)
See below. Thanks April

## 2022-06-19 ENCOUNTER — Telehealth: Payer: Self-pay

## 2022-06-19 NOTE — Telephone Encounter (Signed)
VOB submitted for Durolane, left knee.  Called and left a VM for patient to schedule after 07/05/2022 with Dr. Marlou Sa or Lurena Joiner.

## 2022-07-03 ENCOUNTER — Other Ambulatory Visit: Payer: Self-pay

## 2022-07-03 DIAGNOSIS — M1712 Unilateral primary osteoarthritis, left knee: Secondary | ICD-10-CM

## 2022-07-06 ENCOUNTER — Ambulatory Visit (INDEPENDENT_AMBULATORY_CARE_PROVIDER_SITE_OTHER): Payer: Medicare Other

## 2022-07-06 ENCOUNTER — Ambulatory Visit: Payer: Medicare Other | Admitting: Surgical

## 2022-07-06 ENCOUNTER — Encounter: Payer: Self-pay | Admitting: Surgical

## 2022-07-06 ENCOUNTER — Ambulatory Visit: Payer: Self-pay

## 2022-07-06 DIAGNOSIS — M1712 Unilateral primary osteoarthritis, left knee: Secondary | ICD-10-CM

## 2022-07-06 DIAGNOSIS — M25552 Pain in left hip: Secondary | ICD-10-CM

## 2022-07-06 DIAGNOSIS — M1612 Unilateral primary osteoarthritis, left hip: Secondary | ICD-10-CM | POA: Diagnosis not present

## 2022-07-06 MED ORDER — BUPIVACAINE HCL 0.25 % IJ SOLN
4.0000 mL | INTRAMUSCULAR | Status: AC | PRN
  Administered 2022-07-06: 4 mL via INTRA_ARTICULAR

## 2022-07-06 MED ORDER — LIDOCAINE HCL 1 % IJ SOLN
5.0000 mL | INTRAMUSCULAR | Status: AC | PRN
  Administered 2022-07-06: 5 mL

## 2022-07-06 MED ORDER — SODIUM HYALURONATE 60 MG/3ML IX PRSY
60.0000 mg | PREFILLED_SYRINGE | INTRA_ARTICULAR | Status: AC | PRN
  Administered 2022-07-06: 60 mg via INTRA_ARTICULAR

## 2022-07-06 MED ORDER — METHYLPREDNISOLONE ACETATE 40 MG/ML IJ SUSP
40.0000 mg | INTRAMUSCULAR | Status: AC | PRN
  Administered 2022-07-06: 40 mg via INTRA_ARTICULAR

## 2022-07-06 NOTE — Progress Notes (Signed)
Office Visit Note   Patient: Jenny [name]           Date of Birth: 1941-10-05           MRN: [id number] Visit Date: 07/06/2022 Requested by: Midge Minium, MD [address] [place],  [place] [address] PCP: Midge Minium, MD  Subjective: Chief Complaint  Patient presents with   Left Knee - Pain    HPI: Jenny [name] is a 81 y.o. female who presents to the office complaining of left knee and left hip pain.  Patient has history of left knee arthritis and has had previous gel injections with good relief.  Patient is here to repeat Durolane injection with last injection back on January 02, 2022.  I gave her great relief of her knee pain.  Also more recently, she complains of increased groin pain in the left hip that bothers her with hip range of motion such as trying to rotate her hip in order to put her socks on.  No recent falls or injuries.  She has no radicular pain but does have a history of low back pain with prior lumbar spine fusion.  No radicular pain into the groin but does note pain that starts in her groin and radiates down the anterior aspect of the thigh midway down the thigh..                ROS: All systems reviewed are negative as they relate to the chief complaint within the history of present illness.  Patient denies fevers or chills.  Assessment & Plan: Visit Diagnoses:  1. Unilateral primary osteoarthritis, left knee   2. Pain in left hip   3. Unilateral primary osteoarthritis, left hip     Plan: Patient is an 81 year old female who presents for evaluation of left knee and left hip pain.  She is here for left knee Durolane injection.  Injection successfully delivered and she tolerated the procedure well.  This is given her great relief in the past.  Regarding her left hip pain, she complains of increased groin pain over the last several weeks without injury.  Radiographs demonstrate no fracture but she does have some mild degenerative changes of the  left hip with groin pain that is reproducible with hip range of motion on exam today.  After discussion of options, she would like to try hip injection.  Under ultrasound guidance, hip injection was successfully delivered into the left hip joint.  She tolerated procedure well.  Femoral artery was identified with color Doppler and deliberately avoided with adjustment of the angle of the injection and adjustment of the angle of the probe after identification.  She will follow-up with the office as needed if symptoms persist.  Follow-Up Instructions: No follow-ups on file.   Orders:  Orders Placed This Encounter  Procedures   XR HIP UNILAT W OR W/O PELVIS 2-3 VIEWS LEFT   No orders of the defined types were placed in this encounter.     Procedures: Large Joint Inj: L hip joint on 07/06/2022 2:54 PM Indications: pain and diagnostic evaluation Details: 22 G 3.5 in needle, ultrasound-guided anterior approach  Arthrogram: No  Medications: 5 mL lidocaine 1 %; 4 mL bupivacaine 0.25 %; 40 mg methylPREDNISolone acetate 40 MG/ML Outcome: tolerated well, no immediate complications Procedure, treatment alternatives, risks and benefits explained, specific risks discussed. Consent was given by the patient. Immediately prior to procedure a time out was called to verify the  correct patient, procedure, equipment, support staff and site/side marked as required. Patient was prepped and draped in the usual sterile fashion.    Large Joint Inj: L knee on 07/06/2022 2:55 PM Indications: diagnostic evaluation, joint swelling and pain Details: 18 G 1.5 in needle, superolateral approach  Arthrogram: No  Medications: 5 mL lidocaine 1 %; 60 mg Sodium Hyaluronate 60 MG/3ML Outcome: tolerated well, no immediate complications Procedure, treatment alternatives, risks and benefits explained, specific risks discussed. Consent was given by the patient. Immediately prior to procedure a time out was called to verify the  correct patient, procedure, equipment, support staff and site/side marked as required. Patient was prepped and draped in the usual sterile fashion.       Clinical Data: No additional findings.  Objective: Vital Signs: There were no vitals taken for this visit.  Physical Exam:  Constitutional: Patient appears well-developed HEENT:  Head: Normocephalic Eyes:EOM are normal Neck: Normal range of motion Cardiovascular: Normal rate Pulmonary/chest: Effort normal Neurologic: Patient is alert Skin: Skin is warm Psychiatric: Patient has normal mood and affect  Ortho Exam: Ortho exam demonstrates left knee with trace effusion.  Tenderness over the medial joint line.  No tenderness over the lateral joint line.  Stable to varus and valgus stress at 0 and 30 degrees.  Able to perform straight leg raise without extensor lag.  She has increased pain with FADIR sign that she localizes to the groin.  She has positive Stinchfield sign.  Increased pain with terminal hip flexion that she localizes to the groin.  Negative FABER sign.  Range of motion from 2 degrees extension to about 115 degrees of knee flexion.  Specialty Comments:  No specialty comments available.  Imaging: No results found.   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   Extrinsic asthma without status asthmaticus 09/25/2020   Abnormal CT of the chest 04/18/2020   Colon cancer screening 06/15/2016   COPD mixed type ([place]) 02/10/2016   Hyperglycemia 07/07/2015   Annual physical exam 08/06/2013   Atrophic vaginitis 06/17/2012   Peripheral vascular disease, unspecified ([place]) 06/02/2012   Urticaria 10/15/2011   Anemia 09/21/2011   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   Vitamin D deficiency 02/12/2011   Peripheral  neuropathy 02/12/2011   Intertrigo 93/81/0175   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   History of chicken pox 01/14/2011   History of measles 01/14/2011   OTHER URINARY INCONTINENCE 03/12/2010   Depression with anxiety 03/06/2008   Essential hypertension 02/22/2008   Chronic bronchitis ([place]) 02/22/2008   DIVERTICULOSIS OF COLON 02/22/2008   Unilateral primary osteoarthritis, left knee 02/22/2008   Hypothyroidism 08/23/2007   Morbid obesity ([place]) 08/23/2007   OSA on CPAP 08/23/2007   Venous (peripheral) insufficiency 08/23/2007   Allergic rhinitis 08/23/2007   GERD without esophagitis 08/23/2007   BACK PAIN, LUMBAR 08/23/2007   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   Anemia 09/21/2011   Asthma 01/14/2011   Atrophic vaginitis 06/17/2012   BACK PAIN, LUMBAR 08/23/2007   DEGENERATIVE JOINT DISEASE 02/22/2008   Depression with anxiety 03/06/2008   Qualifier: Diagnosis of  By: Lenna Gilford MD, Deborra Medina    DIVERTICULOSIS OF COLON 02/22/2008   GERD (gastroesophageal reflux disease)    HYPERTENSION 02/22/2008  HYPOTHYROIDISM 08/23/2007   Interstitial lung disease ([place])    OBESITY 08/23/2007   Peripheral neuropathy 02/12/2011   possibly related to spondylolisthesis per pt   Pneumonia due to COVID-19 virus 09/2020   PONV (postoperative nausea and vomiting)    Sleep apnea    VENOUS INSUFFICIENCY 08/23/2007   VITAMIN D DEFICIENCY 08/21/2008    Family History  Problem Relation Age of Onset   COPD Mother    Pneumonia Mother    Other Mother        CHF   Hypertension Mother    Arthritis Mother        septic knee after replacement   Heart disease Mother        chf   Dementia Father    Arthritis Brother    Asthma Daughter    Colon polyps Daughter    Stroke Maternal Grandmother    Heart disease Maternal Grandmother    Other Maternal Grandfather        CHF   Heart disease Maternal  Grandfather        chf   Cancer Paternal Grandmother        stomach?   Other Paternal Grandfather        problems with kidneys   Kidney disease Paternal Grandfather    Anxiety disorder Daughter    Cancer Maternal Aunt        breast   Cancer Maternal Aunt        liver   Colon cancer Neg Hx     Past Surgical History:  Procedure Laterality Date   ABDOMINAL HYSTERECTOMY     took one ovary   AP repair and sling for cystocele  8-04    Dr Gaynelle Arabian   APPENDECTOMY     carpal tunel  2008   CHOLECYSTECTOMY     ctr     bilateral   EYE SURGERY     left hand  2010   left thumb, joint removal, CTR   NECK SURGERY     x 2 - discectomy, fusion   SHOULDER ARTHROSCOPY DISTAL CLAVICLE EXCISION AND OPEN ROTATOR CUFF REPAIR     right   TONSILLECTOMY AND ADENOIDECTOMY     TUBAL LIGATION     Social History   Occupational History   Occupation: Retired  Tobacco Use   Smoking status: Former    Packs/day: 1.50    Years: 30.00    Total pack years: 45.00    Types: Cigarettes    Quit date: 10/13/1991    Years since quitting: 30.7   Smokeless tobacco: Never  Vaping Use   Vaping Use: Never used  Substance and Sexual Activity   Alcohol use: No    Alcohol/week: 0.0 standard drinks of alcohol   Drug use: No   Sexual activity: Never

## 2022-07-07 ENCOUNTER — Other Ambulatory Visit: Payer: Self-pay | Admitting: Internal Medicine

## 2022-07-08 ENCOUNTER — Other Ambulatory Visit: Payer: Self-pay | Admitting: Family Medicine

## 2022-07-08 NOTE — Telephone Encounter (Signed)
Patient is requesting a refill of the following medications: Requested Prescriptions   Pending Prescriptions Disp Refills   ALPRAZolam (XANAX) 0.5 MG tablet [Pharmacy Med Name: ALPRAZOLAM 0.5 MG TABLET] 90 tablet 0    Sig: TAKE 1/2 TO 1 TABLET BY MOUTH 3 TIMES DAILY AS NEEDED FOR ANXIETY    Date of patient request: 07/08/22 Last office visit: 05/26/22 Date of last refill: 04/02/22 Last refill amount: 90

## 2022-07-14 ENCOUNTER — Ambulatory Visit (INDEPENDENT_AMBULATORY_CARE_PROVIDER_SITE_OTHER): Payer: Medicare Other

## 2022-07-14 VITALS — Ht 64.0 in | Wt 200.0 lb

## 2022-07-14 DIAGNOSIS — Z0000 Encounter for general adult medical examination without abnormal findings: Secondary | ICD-10-CM | POA: Diagnosis not present

## 2022-07-14 NOTE — Patient Instructions (Signed)
[name] , Thank you for taking time to come for your Medicare Wellness Visit. I appreciate your ongoing commitment to your health goals. Please review the following plan we discussed and let me know if I can assist you in the future.   These are the goals we discussed:  Goals      <enter goal here>     Maintain current health by staying as active as possible.       PharmD Care Plan     CARE PLAN ENTRY  Current Barriers:  Chronic Disease Management support, education, and care coordination needs related to HTN and hypothyroidism , GERD, depression.  Pharmacist Clinical Goal(s):  BP <140/90 TSH 0.35-4.5 Receive Shingrix vaccine series over the next year  Interventions: Comprehensive medication review performed. Will refile for patient assistance with patient.  Patient Self Care Activities:  Fill out your section of patient assistance application and drop off with Dr. Halford Chessman next appointment.  Patient verbalizes understanding of plan to work with pharmacist on patient assistance. Try keeping an eye on your home blood pressure, checking at least once every 1-2 weeks. Document and provide at future visits.  Please call me with any questions at [phone].  Initial goal documentation.     Track and Manage My Blood Pressure-Hypertension     Timeframe:  Long-Range Goal Priority:  High Start Date:   12/03/21                          Expected End Date: 06/02/22                      Follow Up Date 04/02/22    - check blood pressure daily - choose a place to take my blood pressure (home, clinic or office, retail store) - write blood pressure results in a log or diary    Why is this important?   You won't feel high blood pressure, but it can still hurt your blood vessels.  High blood pressure can cause heart or kidney problems. It can also cause a stroke.  Making lifestyle changes like losing a little weight or eating less salt will help.  Checking your blood pressure at home  and at different times of the day can help to control blood pressure.  If the doctor prescribes medicine remember to take it the way the doctor ordered.  Call the office if you cannot afford the medicine or if there are questions about it.     Notes:         This is a list of the screening recommended for you and due dates:  Health Maintenance  Topic Date Due   Flu Shot  05/12/2022   Tetanus Vaccine  04/03/2023*   Pneumonia Vaccine  Completed   DEXA scan (bone density measurement)  Completed   HPV Vaccine  Aged Out   COVID-19 Vaccine  Discontinued   Zoster (Shingles) Vaccine  Discontinued  *Topic was postponed. The date shown is not the original due date.    Advanced directives: Please bring a copy of your health care power of attorney and living will to the office to be added to your chart at your convenience.   Conditions/risks identified: Aim for 30 minutes of exercise or brisk walking, 6-8 glasses of water, and 5 servings of fruits and vegetables each day.   Next appointment: Follow up in one year for your annual wellness visit    Preventive Care  68 Years and Older, Female Preventive care refers to lifestyle choices and visits with your health care provider that can promote health and wellness. What does preventive care include? A yearly physical exam. This is also called an annual well check. Dental exams once or twice a year. Routine eye exams. Ask your health care provider how often you should have your eyes checked. Personal lifestyle choices, including: Daily care of your teeth and gums. Regular physical activity. Eating a healthy diet. Avoiding tobacco and drug use. Limiting alcohol use. Practicing safe sex. Taking low-dose aspirin every day. Taking vitamin and mineral supplements as recommended by your health care provider. What happens during an annual well check? The services and screenings done by your health care provider during your annual well check will  depend on your age, overall health, lifestyle risk factors, and family history of disease. Counseling  Your health care provider may ask you questions about your: Alcohol use. Tobacco use. Drug use. Emotional well-being. Home and relationship well-being. Sexual activity. Eating habits. History of falls. Memory and ability to understand (cognition). Work and work Statistician. Reproductive health. Screening  You may have the following tests or measurements: Height, weight, and BMI. Blood pressure. Lipid and cholesterol levels. These may be checked every 5 years, or more frequently if you are over 83 years old. Skin check. Lung cancer screening. You may have this screening every year starting at age 83 if you have a 30-pack-year history of smoking and currently smoke or have quit within the past 15 years. Fecal occult blood test (FOBT) of the stool. You may have this test every year starting at age 83. Flexible sigmoidoscopy or colonoscopy. You may have a sigmoidoscopy every 5 years or a colonoscopy every 10 years starting at age 83. Hepatitis C blood test. Hepatitis B blood test. Sexually transmitted disease (STD) testing. Diabetes screening. This is done by checking your blood sugar (glucose) after you have not eaten for a while (fasting). You may have this done every 1-3 years. Bone density scan. This is done to screen for osteoporosis. You may have this done starting at age 83. Mammogram. This may be done every 1-2 years. Talk to your health care provider about how often you should have regular mammograms. Talk with your health care provider about your test results, treatment options, and if necessary, the need for more tests. Vaccines  Your health care provider may recommend certain vaccines, such as: Influenza vaccine. This is recommended every year. Tetanus, diphtheria, and acellular pertussis (Tdap, Td) vaccine. You may need a Td booster every 10 years. Zoster vaccine. You may  need this after age 60. Pneumococcal 13-valent conjugate (PCV13) vaccine. One dose is recommended after age 9. Pneumococcal polysaccharide (PPSV23) vaccine. One dose is recommended after age 1. Talk to your health care provider about which screenings and vaccines you need and how often you need them. This information is not intended to replace advice given to you by your health care provider. Make sure you discuss any questions you have with your health care provider. Document Released: 10/25/2015 Document Revised: 06/17/2016 Document Reviewed: 07/30/2015 Elsevier Interactive Patient Education  2017 [employer] Prevention in the Home Falls can cause injuries. They can happen to people of all ages. There are many things you can do to make your home safe and to help prevent falls. What can I do on the outside of my home? Regularly fix the edges of walkways and driveways and fix any cracks. Remove anything that  might make you trip as you walk through a door, such as a raised step or threshold. Trim any bushes or trees on the path to your home. Use bright outdoor lighting. Clear any walking paths of anything that might make someone trip, such as rocks or tools. Regularly check to see if handrails are loose or broken. Make sure that both sides of any steps have handrails. Any raised decks and porches should have guardrails on the edges. Have any leaves, snow, or ice cleared regularly. Use sand or salt on walking paths during winter. Clean up any spills in your garage right away. This includes oil or grease spills. What can I do in the bathroom? Use night lights. Install grab bars by the toilet and in the tub and shower. Do not use towel bars as grab bars. Use non-skid mats or decals in the tub or shower. If you need to sit down in the shower, use a plastic, non-slip stool. Keep the floor dry. Clean up any water that spills on the floor as soon as it happens. Remove soap buildup in  the tub or shower regularly. Attach bath mats securely with double-sided non-slip rug tape. Do not have throw rugs and other things on the floor that can make you trip. What can I do in the bedroom? Use night lights. Make sure that you have a light by your bed that is easy to reach. Do not use any sheets or blankets that are too big for your bed. They should not hang down onto the floor. Have a firm chair that has side arms. You can use this for support while you get dressed. Do not have throw rugs and other things on the floor that can make you trip. What can I do in the kitchen? Clean up any spills right away. Avoid walking on wet floors. Keep items that you use a lot in easy-to-reach places. If you need to reach something above you, use a strong step stool that has a grab bar. Keep electrical cords out of the way. Do not use floor polish or wax that makes floors slippery. If you must use wax, use non-skid floor wax. Do not have throw rugs and other things on the floor that can make you trip. What can I do with my stairs? Do not leave any items on the stairs. Make sure that there are handrails on both sides of the stairs and use them. Fix handrails that are broken or loose. Make sure that handrails are as long as the stairways. Check any carpeting to make sure that it is firmly attached to the stairs. Fix any carpet that is loose or worn. Avoid having throw rugs at the top or bottom of the stairs. If you do have throw rugs, attach them to the floor with carpet tape. Make sure that you have a light switch at the top of the stairs and the bottom of the stairs. If you do not have them, ask someone to add them for you. What else can I do to help prevent falls? Wear shoes that: Do not have high heels. Have rubber bottoms. Are comfortable and fit you well. Are closed at the toe. Do not wear sandals. If you use a stepladder: Make sure that it is fully opened. Do not climb a closed  stepladder. Make sure that both sides of the stepladder are locked into place. Ask someone to hold it for you, if possible. Clearly mark and make sure that you can see:  Any grab bars or handrails. First and last steps. Where the edge of each step is. Use tools that help you move around (mobility aids) if they are needed. These include: Canes. Walkers. Scooters. Crutches. Turn on the lights when you go into a dark area. Replace any light bulbs as soon as they burn out. Set up your furniture so you have a clear path. Avoid moving your furniture around. If any of your floors are uneven, fix them. If there are any pets around you, be aware of where they are. Review your medicines with your doctor. Some medicines can make you feel dizzy. This can increase your chance of falling. Ask your doctor what other things that you can do to help prevent falls. This information is not intended to replace advice given to you by your health care provider. Make sure you discuss any questions you have with your health care provider. Document Released: 07/25/2009 Document Revised: 03/05/2016 Document Reviewed: 11/02/2014 Elsevier Interactive Patient Education  2017 [employer].

## 2022-07-14 NOTE — Progress Notes (Signed)
Subjective:   Jenny [name] is a 81 y.o. female who presents for Medicare Annual (Subsequent) preventive examination.   Virtual Visit via Telephone Note  I connected with  Jenny [name] on 07/14/22 at 12:45 PM EDT by telephone and verified that I am speaking with the correct person using two identifiers.  Location: Patient: home  Provider: [place]  Persons participating in the virtual visit: patient/Nurse Health Advisor   I discussed the limitations, risks, security and privacy concerns of performing an evaluation and management service by telephone and the availability of in person appointments. The patient expressed understanding and agreed to proceed.  Interactive audio and video telecommunications were attempted between this nurse and patient, however failed, due to patient having technical difficulties OR patient did not have access to video capability.  We continued and completed visit with audio only.  Some vital signs may be absent or patient reported.   Daphane Shepherd, LPN  Review of Systems     Cardiac Risk Factors include: advanced age (>24mn, >>6women);hypertension     Objective:    Today's Vitals   07/14/22 1248  Weight: 200 lb (90.7 kg)  Height: '5\' 4"'$  (1.626 m)   Body mass index is 34.33 kg/m.     07/14/2022   12:53 PM 04/02/2022    9:45 AM 07/04/2021    3:07 PM 09/29/2020    9:32 PM 05/07/2020   10:21 AM 04/28/2020    9:05 AM 04/01/2020    9:04 AM  Advanced Directives  Does Patient Have a Medical Advance Directive? Yes No Yes No Yes Yes Yes  Type of AParamed[place]of [place]ners[place] will  Living will;Healthcare Power of [place] will [place] will [place] Mill[place] will  Does patient want to make changes to medical advance directive?   Yes (MAU/Ambulatory/Procedural Areas - Information given)      Copy of [place]  Chart? No - copy requested  No - copy requested  No - copy requested  No - copy requested  Would patient like information on creating a medical advance directive?  No - Patient declined  No - Patient declined       Current Medications (verified) Outpatient Encounter Medications as of 07/14/2022  Medication Sig   albuterol (PROVENTIL) (2.5 MG/3ML) 0.083% nebulizer solution Take 3 mLs (2.5 mg total) by nebulization every 6 (six) hours as needed for wheezing or shortness of breath.   albuterol (VENTOLIN HFA) 108 (90 Base) MCG/ACT inhaler INHALE 1-2 PUFFS INTO THE LUNGS EVERY 6 HOURS AS NEEDED.   ALPRAZolam (XANAX) 0.5 MG tablet TAKE 1/2 TO 1 TABLET BY MOUTH 3 TIMES DAILY AS NEEDED FOR ANXIETY   amLODipine (NORVASC) 5 MG tablet TAKE 1 TABLET BY MOUTH  DAILY   benzonatate (TESSALON) 200 MG capsule Take 1 capsule (200 mg total) by mouth 3 (three) times daily as needed for cough.   budesonide-formoterol (SYMBICORT) 160-4.5 MCG/ACT inhaler Inhale 2 puffs into the lungs 2 (two) times daily.   Dupilumab (DUPIXENT) 300 MG/2ML SOPN Inject 300 mg into the skin every 14 (fourteen) days.   escitalopram (LEXAPRO) 10 MG tablet TAKE 1 TABLET BY MOUTH DAILY   fluticasone (FLONASE) 50 MCG/ACT nasal spray PLACE 1 SPRAY INTO BOTH NOSTRILS DAILY.   ibuprofen (ADVIL) 200 MG tablet Take 200 mg by mouth every 6 (six) hours as needed.   levothyroxine (SYNTHROID) 112 MCG tablet TAKE 1 TABLET BY MOUTH  DAILY   meclizine (ANTIVERT) 25 MG tablet TAKE 1 TABLET BY MOUTH 3 TIMES A DAY AS NEEDED FOR DIZZINESS   pantoprazole (PROTONIX) 40 MG tablet TAKE 1 TABLET BY MOUTH EVERY DAY 30 MINUTES BEFORE DINNER   [DISCONTINUED] esomeprazole (NEXIUM) 40 MG capsule Take 40 mg by mouth daily before breakfast.     No facility-administered encounter medications on file as of 07/14/2022.    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   GERD (gastroesophageal reflux disease)    HYPERTENSION 02/22/2008   HYPOTHYROIDISM 08/23/2007   Interstitial lung disease ([place])    OBESITY 08/23/2007   Peripheral neuropathy 02/12/2011   possibly related to spondylolisthesis per pt   Pneumonia due to COVID-19 virus 09/2020   PONV (postoperative nausea and vomiting)    Sleep apnea    VENOUS INSUFFICIENCY 08/23/2007   VITAMIN D DEFICIENCY 08/21/2008   Past Surgical History:  Procedure Laterality Date   ABDOMINAL HYSTERECTOMY     took one ovary   AP repair and sling for cystocele  8-04    Dr Gaynelle Arabian   APPENDECTOMY     carpal tunel  2008   CHOLECYSTECTOMY     ctr     bilateral   EYE SURGERY     left hand  2010   left thumb, joint removal, CTR   NECK SURGERY     x 2 - discectomy, fusion   SHOULDER ARTHROSCOPY DISTAL CLAVICLE EXCISION AND OPEN ROTATOR CUFF REPAIR     right   TONSILLECTOMY AND ADENOIDECTOMY     TUBAL LIGATION     Family History  Problem Relation Age of Onset   COPD Mother    Pneumonia Mother    Other Mother        CHF   Hypertension Mother    Arthritis Mother        septic knee after replacement   Heart disease Mother        chf   Dementia Father    Arthritis Brother    Asthma Daughter    Colon polyps Daughter    Stroke Maternal Grandmother    Heart disease Maternal Grandmother    Other Maternal Grandfather        CHF   Heart disease Maternal Grandfather        chf   Cancer Paternal Grandmother        stomach?   Other Paternal Grandfather        problems with kidneys   Kidney disease Paternal Grandfather    Anxiety disorder Daughter    Cancer Maternal Aunt        breast   Cancer  Maternal Aunt        liver   Colon cancer Neg Hx    Social History   Socioeconomic History   Marital status: Widowed    Spouse name: Not on file   Number of children: Not on file   Years of education: Not on file   Highest education level: Not on file  Occupational History   Occupation: Retired  Tobacco Use   Smoking  status: Former    Packs/day: 1.50    Years: 30.00    Total pack years: 45.00    Types: Cigarettes    Quit date: 10/13/1991    Years since quitting: 30.7   Smokeless tobacco: Never  Vaping Use   Vaping Use: Never used  Substance and Sexual Activity   Alcohol use: No    Alcohol/week: 0.0 standard drinks of alcohol   Drug use: No   Sexual activity: Never  Other Topics Concern   Not on file  Social History Narrative   Not on file   Social Determinants of Health   Financial Resource Strain: Low Risk  (07/14/2022)   Overall Financial Resource Strain (CARDIA)    Difficulty of Paying Living Expenses: Not hard at all  Food Insecurity: No Food Insecurity (07/14/2022)   Hunger Vital Sign    Worried About Running Out of Food in the Last Year: Never true    Ran Out of Food in the Last Year: Never true  Transportation Needs: No Transportation Needs (07/14/2022)   PRAPARE - Hydrologist (Medical): No    Lack of Transportation (Non-Medical): No  Physical Activity: Insufficiently Active (07/14/2022)   Exercise Vital Sign    Days of Exercise per Week: 3 days    Minutes of Exercise per Session: 30 min  Stress: No Stress Concern Present (07/14/2022)   [place]    Feeling of Stress : Not at all  Social Connections: Moderately Integrated (07/14/2022)   Social Connection and Isolation Panel [NHANES]    Frequency of Communication with Friends and Family: More than three times a week    Frequency of Social Gatherings with Friends and Family: More than three times a week    Attends  Religious Services: More than 4 times per year    Active Member of [employer] or Organizations: Yes    Attends Archivist Meetings: Never    Marital Status: Widowed    Tobacco Counseling Counseling given: Not Answered   Clinical Intake:  Pre-visit preparation completed: Yes  Pain : No/denies pain     Nutritional Risks: None Diabetes: No  How often do you need to have someone help you when you read instructions, pamphlets, or other written materials from your doctor or pharmacy?: 1 - Never  Diabetic?no   Interpreter Needed?: No  Information entered by :: Jadene Pierini, LPN   Activities of Daily Living    07/14/2022   12:53 PM  In your present state of health, do you have any difficulty performing the following activities:  Hearing? 0  Vision? 0  Difficulty concentrating or making decisions? 0  Walking or climbing stairs? 0  Dressing or bathing? 0  Doing errands, shopping? 0  Preparing Food and eating ? N  Using the Toilet? N  In the past six months, have you accidently leaked urine? N  Do you have problems with loss of bowel control? N  Managing your Medications? N  Managing your Finances? N  Housekeeping or managing your Housekeeping? N    Patient Care Team: Midge Minium, MD as PCP - General (Family Medicine) Noralee Space, MD as Consulting Physician (Pulmonary Disease) Ladene Artist, MD as Consulting Physician (Gastroenterology) Garrel Ridgel, [place] as Consulting Physician (Podiatry) Dian Queen, MD as Consulting Physician (Obstetrics and Gynecology) Edythe Clarity, [facility] (Pharmacist)  Indicate any recent Medical Services you may have received from other than [facility] providers  in the past year (date may be approximate).     Assessment:   This is a routine wellness examination for [place].  Hearing/Vision screen Vision Screening - Comments:: Annual eye exams wear glasses   Dietary issues and exercise activities discussed: Current  Exercise Habits: Home exercise routine, Type of exercise: walking, Time (Minutes): 30, Frequency (Times/Week): 3, Weekly Exercise (Minutes/Week): 90, Intensity: Mild, Exercise limited by: orthopedic condition(s)   Goals Addressed             This Visit's Progress    <enter goal here>   On track    Maintain current health by staying as active as possible.         Depression Screen    07/14/2022   12:51 PM 04/02/2022    8:34 AM 08/20/2021    4:09 PM 07/04/2021    3:12 PM 04/09/2021    1:20 PM 12/25/2020    2:01 PM 12/04/2020    2:21 PM  PHQ 2/9 Scores  PHQ - 2 Score 0 0 '4 6 6 '$ 0 0  PHQ- 9 Score  0 '10 13 14 '$ 0 0    Fall Risk    07/14/2022   12:50 PM 07/14/2022   12:49 PM 04/02/2022    8:34 AM 07/04/2021    3:14 PM 04/09/2021    1:19 PM  Fall Risk   Falls in the past year? 1 0 1 0 0  Number falls in past yr: 1 0 0 0 0  Injury with Fall? 1 0 0  0  Risk for fall due to : History of fall(s);Impaired balance/gait;Orthopedic patient No Fall Risks History of fall(s) No Fall Risks No Fall Risks  Follow up Education provided;Falls prevention discussed Falls prevention discussed Falls evaluation completed      FALL RISK PREVENTION PERTAINING TO THE HOME:  Any stairs in or around the home? No  If so, are there any without handrails? No  Home free of loose throw rugs in walkways, pet beds, electrical cords, etc? Yes  Adequate lighting in your home to reduce risk of falls? Yes   ASSISTIVE DEVICES UTILIZED TO PREVENT FALLS:  Life alert? No  Use of a cane, walker or w/c? No  Grab bars in the bathroom? No  Shower chair or bench in shower? Yes  Elevated toilet seat or a handicapped toilet? No       07/14/2022   12:53 PM 07/04/2021    3:15 PM 04/01/2020    9:14 AM  6CIT Screen  What Year? 0 points 0 points 0 points  What month? 0 points 0 points 0 points  What time? 0 points 0 points 0 points  Count back from 20 0 points 0 points 0 points  Months in reverse 0 points 0 points 0  points  Repeat phrase 0 points 0 points 0 points  Total Score 0 points 0 points 0 points    Immunizations Immunization History  Administered Date(s) Administered   Fluad Quad(high Dose 65+) 08/02/2019, 08/05/2020, 07/05/2021   Influenza Split 08/20/2011, 09/30/2012   Influenza Whole 08/13/2013   Influenza, High Dose Seasonal PF 10/13/2014, 09/03/2015, 06/03/2017, 09/25/2018   Influenza-Unspecified 07/11/2016, 07/06/2021   Moderna Sars-Covid-2 Vaccination 11/17/2019, 12/24/2019   Pneumococcal Conjugate-13 03/16/2014   Pneumococcal Polysaccharide-23 06/23/2016   Tdap 09/22/2011   Zoster Recombinat (Shingrix) 03/16/2014   Zoster, Live 03/16/2014    TDAP status: Due, Education has been provided regarding the importance of this vaccine. Advised may receive this vaccine at local pharmacy or Health  Dept. Aware to provide a copy of the vaccination record if obtained from local pharmacy or Health Dept. Verbalized acceptance and understanding.  Flu Vaccine status: Due, Education has been provided regarding the importance of this vaccine. Advised may receive this vaccine at local pharmacy or Health Dept. Aware to provide a copy of the vaccination record if obtained from local pharmacy or Health Dept. Verbalized acceptance and understanding.  Pneumococcal vaccine status: Up to date  Covid-19 vaccine status: Completed vaccines  Qualifies for Shingles Vaccine? Yes   Zostavax completed No   Shingrix Completed?: Yes  Screening Tests Health Maintenance  Topic Date Due   INFLUENZA VACCINE  05/12/2022   TETANUS/TDAP  04/03/2023 (Originally 09/21/2021)   Pneumonia Vaccine 46+ Years old  Completed   DEXA SCAN  Completed   HPV VACCINES  Aged Out   COVID-19 Vaccine  Discontinued   Zoster Vaccines- Shingrix  Discontinued    Health Maintenance  Health Maintenance Due  Topic Date Due   INFLUENZA VACCINE  05/12/2022    Colorectal cancer screening: No longer required.   Mammogram status: No  longer required due to age.  Bone Density status: Completed 06/29/2017. Results reflect: Bone density results: OSTEOPENIA. Repeat every 5 years.  Lung Cancer Screening: (Low Dose CT Chest recommended if Age 70-80 years, 30 pack-year currently smoking OR have quit w/in 15years.) does not qualify.   Lung Cancer Screening Referral: n/a  Additional Screening:  Hepatitis C Screening: does not qualify;   Vision Screening: Recommended annual ophthalmology exams for early detection of glaucoma and other disorders of the eye. Is the patient up to date with their annual eye exam?  Yes  Who is the provider or what is the name of the office in which the patient attends annual eye exams? Dr.Byrd  If pt is not established with a provider, would they like to be referred to a provider to establish care? No .   Dental Screening: Recommended annual dental exams for proper oral hygiene  Community Resource Referral / Chronic Care Management: CRR required this visit?  No   CCM required this visit?  No      Plan:     I have personally reviewed and noted the following in the patient's chart:   Medical and social history Use of alcohol, tobacco or illicit drugs  Current medications and supplements including opioid prescriptions. Patient is not currently taking opioid prescriptions. Functional ability and status Nutritional status Physical activity Advanced directives List of other physicians Hospitalizations, surgeries, and ER visits in previous 12 months Vitals Screenings to include cognitive, depression, and falls Referrals and appointments  In addition, I have reviewed and discussed with patient certain preventive protocols, quality metrics, and best practice recommendations. A written personalized care plan for preventive services as well as general preventive health recommendations were provided to patient.     Daphane Shepherd, LPN   [id number]   Nurse Notes: Due TDAP/Flu Vaccine

## 2022-07-29 ENCOUNTER — Other Ambulatory Visit: Payer: Self-pay | Admitting: Family Medicine

## 2022-08-12 DIAGNOSIS — H16223 Keratoconjunctivitis sicca, not specified as Sjogren's, bilateral: Secondary | ICD-10-CM | POA: Diagnosis not present

## 2022-08-18 ENCOUNTER — Other Ambulatory Visit: Payer: Self-pay | Admitting: Family Medicine

## 2022-08-19 NOTE — Telephone Encounter (Signed)
Xanax 0.5 mg LOV: 04/02/22 Last Refill:07/08/22 Upcoming appt: 12/09/22

## 2022-09-28 ENCOUNTER — Telehealth: Payer: Self-pay

## 2022-09-28 NOTE — Telephone Encounter (Signed)
Contacted pt and discussed PAP re-enrollment for calendar year 2024. Pt states they have already received and completed renewal application and have sent it back in. Will await determination letter.

## 2022-09-29 NOTE — Telephone Encounter (Signed)
Received a fax from  [place] regarding an approval for [employer] patient assistance from 10/12/2022 to 10/12/2023. Approval letter sent to scan center.  Phone number: [phone], option 1  Knox Saliva, PharmD, MPH, BCPS, CPP Clinical Pharmacist (Rheumatology and Pulmonology)

## 2022-10-09 ENCOUNTER — Other Ambulatory Visit: Payer: Self-pay | Admitting: Family Medicine

## 2022-10-13 NOTE — Telephone Encounter (Signed)
Patient is requesting a refill of the following medications: Requested Prescriptions   Pending Prescriptions Disp Refills   ALPRAZolam (XANAX) 0.5 MG tablet [Pharmacy Med Name: ALPRAZOLAM 0.5 MG TABLET] 90 tablet 0    Sig: TAKE 1/2 TO 1 TABLET BY MOUTH 3 TIMES DAILY AS NEEDED FOR ANXIETY    Date of patient request: 10/13/22 Last office visit: 04/02/22 Date of last refill: 08/19/22 Last refill amount: 90

## 2022-10-14 ENCOUNTER — Telehealth: Payer: Self-pay | Admitting: Adult Health

## 2022-10-14 DIAGNOSIS — J4550 Severe persistent asthma, uncomplicated: Secondary | ICD-10-CM

## 2022-10-14 MED ORDER — DUPIXENT 300 MG/2ML ~~LOC~~ SOAJ: 300.0000 mg | SUBCUTANEOUS | 0 refills | Status: DC

## 2022-10-14 NOTE — Telephone Encounter (Signed)
Patient has not been seen in clinic since Nov 2022. She is significantly overdue for appt. Refill for Dupixent will only be sent for 2 months as pt has f/u appt with Dr. Halford Chessman on 11/16/2022. I called pt to advise of this. She said she never received call for f/u appt when Dr. Juanetta Gosling schedule opened up. I advised that she is always welcome to call our clinic if she does not receive call. She verbalized understandin.  Knox Saliva, PharmD, MPH, BCPS, CPP Clinical Pharmacist (Rheumatology and Pulmonology)

## 2022-10-23 ENCOUNTER — Ambulatory Visit: Payer: Medicare Other | Admitting: Podiatry

## 2022-10-23 DIAGNOSIS — Q828 Other specified congenital malformations of skin: Secondary | ICD-10-CM | POA: Diagnosis not present

## 2022-10-23 NOTE — Progress Notes (Unsigned)
Subjective: Chief Complaint  Patient presents with   Callouses    Left foot, ball of foot, lateral side     82 year old female presents the office today for concerns of calluses to her left foot.  She said the calluses are causing pain when she walks.  She has not had any open sores.  No drainage or pus.  No other concerns.     Objective: AAO x3, NAD DP/PT pulses palpable bilaterally, CRT less than 3 seconds Hyperkeratotic lesions left foot submetatarsal 1, 5 without any underlying ulceration drainage or signs of infection.  No edema, erythema.  Prominent metatarsal heads. No pain with calf compression, swelling, warmth, erythema  Assessment: Hyperkeratotic lesions left foot  Plan: -All treatment options discussed with the patient including all alternatives, risks, complications.  -Sharply debrided the hyperkeratotic lesions x2 without any complications or bleeding. -Moisturizer, offloading. -Patient encouraged to call the office with any questions, concerns, change in symptoms.   Trula Slade DPM

## 2022-11-16 ENCOUNTER — Encounter (HOSPITAL_BASED_OUTPATIENT_CLINIC_OR_DEPARTMENT_OTHER): Payer: Self-pay | Admitting: Pulmonary Disease

## 2022-11-16 ENCOUNTER — Ambulatory Visit (INDEPENDENT_AMBULATORY_CARE_PROVIDER_SITE_OTHER): Payer: Medicare Other | Admitting: Pulmonary Disease

## 2022-11-16 VITALS — BP 116/82 | HR 79 | Temp 98.4°F | Ht 64.0 in | Wt 209.8 lb

## 2022-11-16 DIAGNOSIS — J849 Interstitial pulmonary disease, unspecified: Secondary | ICD-10-CM | POA: Diagnosis not present

## 2022-11-16 DIAGNOSIS — J4550 Severe persistent asthma, uncomplicated: Secondary | ICD-10-CM | POA: Diagnosis not present

## 2022-11-16 DIAGNOSIS — G4733 Obstructive sleep apnea (adult) (pediatric): Secondary | ICD-10-CM | POA: Diagnosis not present

## 2022-11-16 DIAGNOSIS — Z23 Encounter for immunization: Secondary | ICD-10-CM

## 2022-11-16 MED ORDER — BUDESONIDE-FORMOTEROL FUMARATE 80-4.5 MCG/ACT IN AERO: 2.0000 | INHALATION_SPRAY | Freq: Two times a day (BID) | RESPIRATORY_TRACT | 11 refills | Status: AC

## 2022-11-16 NOTE — Patient Instructions (Signed)
Will change symbicort to 80 mcg strength, two puffs in the morning and two puffs in the evening.  Will have the pharmacy team renew your dupixent prescription.  Follow up in 6 months.

## 2022-11-16 NOTE — Progress Notes (Signed)
[facility]  Chief Complaint  Patient presents with   Follow-up    Pt states that her breathing has been good but life is rough.     Past Surgical History:  She  has a past surgical history that includes Appendectomy; Tonsillectomy and adenoidectomy; AP repair and sling for cystocele (8-04 ); left hand (2010); ctr; Shoulder arthroscopy distal clavicle excision and open rotator cuff repair; Neck surgery; Abdominal hysterectomy; Cholecystectomy; carpal tunel (2008); Tubal ligation; and Eye surgery.  Past Medical History:  Vit D deficiency, Neuropathy, Low back pain, Hypothyroidism, HTN, GERD, Diverticulosis, Depression, Anxiety, DJD, COVID 19 pneumonia December 2021, Shingles  Constitutional:  BP 116/82 (BP Location: Right Arm, Patient Position: Sitting, Cuff Size: Large)   Pulse 79   Temp 98.4 F (36.9 C) (Oral)   Ht '5\' 4"'$  (1.626 m)   Wt 209 lb 12.8 oz (95.2 kg)   SpO2 97%   BMI 36.01 kg/m   Brief Summary:  Jenny [name] is a 82 y.o. 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      Subjective:   She was last seen in office in 2022.  She feels that dupixent has helped tremendously.  She doesn't need to use albuterol much.  Not having cough, wheeze, sinus congestion, or chest pain.  She has been getting some ankle swelling.  She feels tired during the day.  Her back pain limits her activity level.  She is sleeping okay.  No issues with CPAP maski.  Physical Exam:   Appearance - well kempt   ENMT - no sinus tenderness, no oral exudate, no LAN, Mallampati 3 airway, no stridor, wears dentures  Respiratory - equal breath sounds bilaterally, no wheezing or rales  CV - s1s2 regular rate and rhythm, no murmurs  Ext - no clubbing, no edema  Skin - no rashes  Psych - normal mood and affect     Pulmonary testing:   Spirometry 04/21/18 >> FEV1 1.3 (64%), FEV1% 83 IgE 01/30/20 >> 40, Absolute eosinophils 600 PFT 04/17/20 >> FEV1 1.30 (65%), FEV1% 79, TLC 6.97 (133%), RV 5.11 (207%), DLCO 88% Serology 04/17/20 >> ANA, HP panel, anti CCP negative; RF 25  Chest Imaging:  HRCT chest 02/09/20 >> atherosclerosis, 3 mm nodule LLL, mild patchy subpleural reticulation and GGO b/l with mild basilar predominance HRCT chest 02/04/21 >> centrilobular and paraseptal emphysema; mild peripheral and basilar predominant subpleural reticulation and GGO slight increase from April 2021, minimal traction BTX (probable UIP pattern)  Sleep Tests:  PSG April 2008 >> AHI 29, SpO2 low 62% Auto CPAP 11/07/19 to 12/05/18 >> used on 22 of 30 nights with average 8 hrs 40 min.  Average AHI 1.2 with median CPAP 7 and 95 th percentile CPAP 11 cm H2O.  Cardiac Tests:  Echo 04/27/22 >> EF 65 to 70%  Social History:  She  reports that she quit smoking about 31 years ago. Her smoking use included cigarettes. She has a 45.00 pack-year smoking history. She has never used smokeless tobacco. She reports that she does not drink alcohol and does not use drugs.  Family History:  Her family history includes Anxiety disorder in her daughter; Arthritis in her brother and mother; Asthma in her daughter; COPD in her mother; Cancer in her maternal aunt, maternal aunt, and paternal grandmother; Colon polyps in her daughter; Dementia in her father; Heart disease in her maternal grandfather,  maternal grandmother, and mother; Hypertension in her mother; Kidney disease in her paternal grandfather; Other in her maternal grandfather, mother, and paternal grandfather; Pneumonia in her mother; Stroke in her maternal grandmother.     Assessment/Plan:   Eosinophilic asthma, centrilobular/paraseptal emphysema. - singulair caused nightmares - intolerant of prednisone, but can use medrol - has been using dupixent since August 2021, and this has helped reduced  exacerbations; plan to continue dupixent - will change symbicort to 80 two puffs bid - prn albuterol  Allergic rhinitis with post nasal drip. - continue dupixent, flonase, zyrtec  ILD. - mild changes on CT chest from April 2022 - monitor clinically and repeat imaging if she has progressive symptoms   Obstructive sleep apnea. - she is compliant with CPAP and reports benefit from therapy - uses [employer] for her DME - continue auto CPAP - she likely will be eligible for a new CPAP machine later in 2024  Time Spent Involved in Patient Care on Day of Examination:  36 minutes  Follow up:   Patient Instructions  Will change symbicort to 80 mcg strength, two puffs in the morning and two puffs in the evening.  Will have the pharmacy team renew your dupixent prescription.  Follow up in 6 months.  Medication List:   Allergies as of 11/16/2022       Reactions   Prednisone Other (See Comments)   Confusion, dizziness Medrol is ok   Other Other (See Comments)   Patient states that she cannot have any spicy foods   Codeine Nausea Only       Tramadol Itching        Medication List        Accurate as of November 16, 2022 10:20 AM. If you have any questions, ask your nurse or doctor.          STOP taking these medications    budesonide-formoterol 160-4.5 MCG/ACT inhaler Commonly known as: SYMBICORT Replaced by: budesonide-formoterol 80-4.5 MCG/ACT inhaler Stopped by: Chesley Mires, MD       TAKE these medications    albuterol (2.5 MG/3ML) 0.083% nebulizer solution Commonly known as: PROVENTIL Take 3 mLs (2.5 mg total) by nebulization every 6 (six) hours as needed for wheezing or shortness of breath.   albuterol 108 (90 Base) MCG/ACT inhaler Commonly known as: VENTOLIN HFA INHALE 1-2 PUFFS INTO THE LUNGS EVERY 6 HOURS AS NEEDED.   ALPRAZolam 0.5 MG tablet Commonly known as: XANAX TAKE 1/2 TO 1 TABLET BY MOUTH 3 TIMES DAILY AS NEEDED FOR ANXIETY   amLODipine 5 MG  tablet Commonly known as: NORVASC TAKE 1 TABLET BY MOUTH DAILY   benzonatate 200 MG capsule Commonly known as: TESSALON Take 1 capsule (200 mg total) by mouth 3 (three) times daily as needed for cough.   budesonide-formoterol 80-4.5 MCG/ACT inhaler Commonly known as: Symbicort Inhale 2 puffs into the lungs 2 (two) times daily. Replaces: budesonide-formoterol 160-4.5 MCG/ACT inhaler Started by: Chesley Mires, MD   Dupixent 300 MG/2ML Sopn Generic drug: Dupilumab Inject 300 mg into the skin every 14 (fourteen) days.   escitalopram 10 MG tablet Commonly known as: LEXAPRO TAKE 1 TABLET BY MOUTH DAILY   fluticasone 50 MCG/ACT nasal spray Commonly known as: FLONASE PLACE 1 SPRAY INTO BOTH NOSTRILS DAILY.   ibuprofen 200 MG tablet Commonly known as: ADVIL Take 200 mg by mouth every 6 (six) hours as needed.   levothyroxine 112 MCG tablet Commonly known as: SYNTHROID TAKE 1 TABLET BY MOUTH DAILY   meclizine  25 MG tablet Commonly known as: ANTIVERT TAKE 1 TABLET BY MOUTH 3 TIMES A DAY AS NEEDED FOR DIZZINESS   pantoprazole 40 MG tablet Commonly known as: PROTONIX TAKE 1 TABLET BY MOUTH EVERY DAY 30 MINUTES BEFORE DINNER        Signature:  Chesley Mires, MD [facility] Pager - [phone] 11/16/2022, 10:20 AM

## 2022-11-17 ENCOUNTER — Ambulatory Visit: Payer: Medicare Other | Admitting: Podiatry

## 2022-11-18 ENCOUNTER — Other Ambulatory Visit: Payer: Self-pay | Admitting: Family Medicine

## 2022-11-18 ENCOUNTER — Encounter (HOSPITAL_BASED_OUTPATIENT_CLINIC_OR_DEPARTMENT_OTHER): Payer: Self-pay | Admitting: Pulmonary Disease

## 2022-11-18 NOTE — Telephone Encounter (Signed)
Xanax 0.5 mg LOV: 04/02/22 Last Refill:10/13/22 Upcoming appt: 07/29/23

## 2022-11-19 IMAGING — XA Imaging study
1 series · 1 of 1 positions shown · non-contrast
Comparison: none

CLINICAL DATA: Lumbosacral spondylosis without myelopathy. Chronic
low back pain, worse on the right. Prior L4-S1 fusion with severe
adjacent segment disease at L3-L4. History of excellent response to
prior L2-L3 interlaminar epidural injection done at an outside
facility in November 2016.

[Series 3: ortho standard · 1 of 1 slices shown]
[im 1/1]
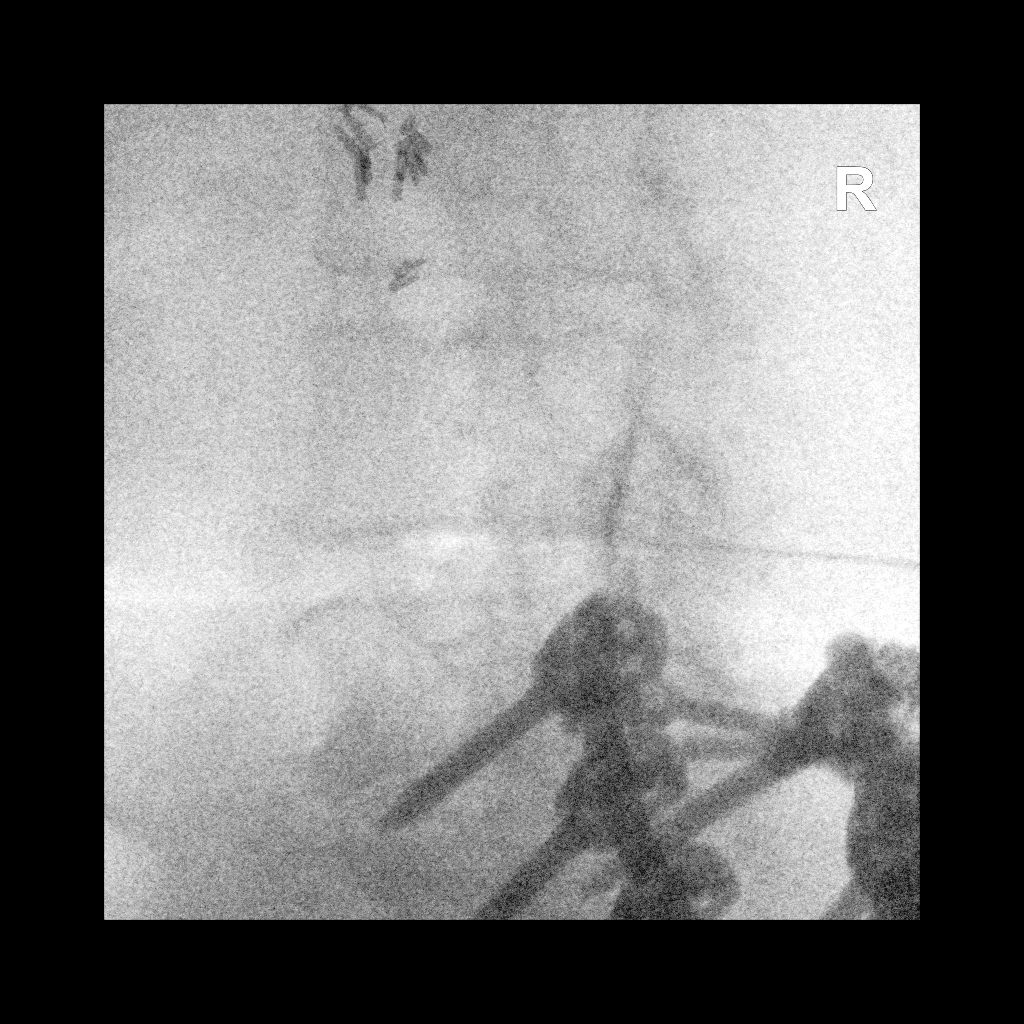

[1 of 1 positions shown; findings below may reference images not displayed]

FLUOROSCOPY TIME:  Radiation Exposure Index (as provided by the
fluoroscopic device): 7.5 mGy

Fluoroscopy Time:  47 seconds

Number of Acquired Images:  0

PROCEDURE:
The procedure, risks, benefits, and alternatives were explained to
the patient. Questions regarding the procedure were encouraged and
answered. The patient understands and consents to the procedure.

LUMBAR EPIDURAL INJECTION:

An interlaminar approach was performed on the right at L2-L3. The
overlying skin was cleansed and anesthetized. A 3.5 inch 20 gauge
epidural needle was advanced using loss-of-resistance technique.

DIAGNOSTIC EPIDURAL INJECTION:

Injection of Isovue-M 200 shows a good epidural pattern with spread
above and below the level of needle placement, primarily on the
right. No vascular opacification is seen.

THERAPEUTIC EPIDURAL INJECTION:

80 mg of Depo-Medrol mixed with 3 mL of 1% lidocaine were instilled.
The procedure was well-tolerated, and the patient was discharged
thirty minutes following the injection in good condition.

COMPLICATIONS:
None immediate.
IMPRESSION: Technically successful interlaminar epidural injection on the right
at L2-L3.

## 2022-11-19 NOTE — Telephone Encounter (Signed)
Pharmacy, please advise if pt's Symbicort 80 isnt covered. Pt was just changed to Symbicort 80 from 160. Pt states she can't afford the Symbicort 80. Thanks.

## 2022-11-23 ENCOUNTER — Other Ambulatory Visit (HOSPITAL_COMMUNITY): Payer: Self-pay

## 2022-11-23 NOTE — Telephone Encounter (Signed)
Unable to check on the benefits for Symbicort but the generic Advair Diskus is being covered, the patient also is in the initial benefit phase of her current plan which could be contributing to the cost.

## 2022-11-24 NOTE — Telephone Encounter (Signed)
Thank you Elder Negus. How long does the "initial benefit phase" last?

## 2022-11-25 NOTE — Telephone Encounter (Signed)
This depends on the individual plan, there is usually a set dollar amount (just like the deductible) that the patient must pay out of pocket to reach the end of the initial phase. Typically the patient would have to reach out to their insurance provider to obtain the exact amount.

## 2022-12-08 ENCOUNTER — Telehealth: Payer: Self-pay | Admitting: Pharmacist

## 2022-12-08 NOTE — Progress Notes (Signed)
Care Management & Coordination Services Pharmacy Team   Reason for Encounter: Appointment Reminder   Contacted patient to confirm telephone appointment with [name], PharmD on 12/09/22 at 12PM. Unsuccessful outreach. Left voicemail for patient to return call.    Future Appointments  Date Time Provider [place]  12/09/2022 12:00 PM Edythe Clarity, [place] [facility] None  02/02/2023 10:15 AM Trula Slade, DPM [facility]  07/29/2023  8:15 AM [facility] [place], [facility]

## 2022-12-09 ENCOUNTER — Encounter: Payer: Medicare Other | Admitting: Pharmacist

## 2022-12-09 NOTE — Progress Notes (Unsigned)
Care Management & Coordination Services Pharmacy Note  12/09/2022 Name:  Jenny [name] MRN:  [id number] DOB:  1941/06/01  Summary: ***  Recommendations/Changes made from today's visit: ***  Follow up plan: ***   Subjective: Jenny [name] is an 82 y.o. year old female who is a primary patient of [name], [name], MD.  The care coordination team was consulted for assistance with disease management and care coordination needs.    {CCMTELEPHONEFACETOFACE:21091510} for {CCMINITIALFOLLOWUPCHOICE:21091511}.  Recent office visits: ***  Recent consult visits: ***  Hospital visits: {Hospital DC Yes/No:25215}   Objective:  Lab Results  Component Value Date   CREATININE 0.99 04/02/2022   BUN 18 04/02/2022   GFR 58.80 (L) 04/09/2021   GFRNONAA 57 (L) 04/02/2022   GFRAA 59 (L) 05/07/2020   NA 140 04/02/2022   K 4.8 04/02/2022   CALCIUM 9.0 04/02/2022   CO2 28 04/02/2022   GLUCOSE 104 (H) 04/02/2022    Lab Results  Component Value Date/Time   HGBA1C 6.2 (H) 09/30/2020 06:02 AM   HGBA1C 5.8 11/01/2015 08:27 AM   GFR 58.80 (L) 04/09/2021 02:07 PM   GFR 56.43 (L) 01/30/2020 09:39 AM   MICROALBUR 1.3 01/14/2011 11:05 AM    Last diabetic Eye exam: No results found for: "HMDIABEYEEXA"  Last diabetic Foot exam: No results found for: "HMDIABFOOTEX"   Lab Results  Component Value Date   CHOL 131 04/09/2021   HDL 37.90 (L) 04/09/2021   LDLCALC 68 04/09/2021   TRIG 127.0 04/09/2021   CHOLHDL 3 04/09/2021       Latest Ref Rng & Units 04/09/2021    2:07 PM 10/04/2020    4:30 AM 10/03/2020   10:28 AM  Hepatic Function  Total Protein 6.0 - 8.3 g/dL 6.4  6.2  6.7   Albumin 3.5 - 5.2 g/dL 4.0  3.3  3.6   AST 0 - 37 U/L '14  31  31   '$ ALT 0 - 35 U/L 9  37  26   Alk Phosphatase 39 - 117 U/L 83  73  70   Total Bilirubin 0.2 - 1.2 mg/dL 0.7  0.4  0.5   Bilirubin, Direct 0.0 - 0.3 mg/dL 0.2       Lab Results  Component Value Date/Time   TSH 1.300 04/02/2022 04:09  PM   TSH 1.60 04/09/2021 02:07 PM   TSH 3.66 12/05/2019 09:43 AM   FREET4 1.45 08/03/2013 10:56 AM   FREET4 1.05 02/08/2013 09:39 AM       Latest Ref Rng & Units 04/02/2022   10:19 AM 04/09/2021    2:07 PM 10/04/2020    4:30 AM  CBC  WBC 4.0 - 10.5 K/uL 6.6  9.8  7.6   Hemoglobin 12.0 - 15.0 g/dL 14.4  13.4  14.3   Hematocrit 36.0 - 46.0 % 44.6  40.3  43.9   Platelets 150 - 400 K/uL 273  270.0  226     Lab Results  Component Value Date/Time   VD25OH 24.64 (L) 04/09/2021 02:07 PM   VD25OH 17.35 (L) 11/09/2019 09:31 AM   VITAMINB12 220 09/21/2011 03:09 PM    Clinical ASCVD: {YES/NO:21197} The ASCVD Risk score (Arnett DK, et al., 2019) failed to calculate for the following reasons:   The 2019 ASCVD risk score is only valid for ages 47 to 62    ***Other: (CHADS2VASc if Afib, MMRC or CAT for COPD, ACT, DEXA)     07/14/2022   12:51 PM 04/02/2022    8:34  AM 08/20/2021    4:09 PM  Depression screen PHQ 2/9  Decreased Interest 0 0 2  Down, Depressed, Hopeless 0 0 2  PHQ - 2 Score 0 0 4  Altered sleeping  0 2  Tired, decreased energy  0 1  Change in appetite  0 1  Feeling bad or failure about yourself   0 0  Trouble concentrating  0 2  Moving slowly or fidgety/restless  0 0  Suicidal thoughts  0 0  PHQ-9 Score  0 10  Difficult doing work/chores  Not difficult at all Somewhat difficult     Social History   Tobacco Use  Smoking Status Former   Packs/day: 1.50   Years: 30.00   Total pack years: 45.00   Types: Cigarettes   Quit date: 10/13/1991   Years since quitting: 31.1  Smokeless Tobacco Never   BP Readings from Last 3 Encounters:  11/16/22 116/82  05/22/22 (!) 146/71  04/02/22 (!) 145/74   Pulse Readings from Last 3 Encounters:  11/16/22 79  05/22/22 74  04/02/22 63   Wt Readings from Last 3 Encounters:  11/16/22 209 lb 12.8 oz (95.2 kg)  07/14/22 200 lb (90.7 kg)  05/22/22 206 lb (93.4 kg)   BMI Readings from Last 3 Encounters:  11/16/22 36.01 kg/m   07/14/22 34.33 kg/m  05/22/22 35.36 kg/m    Allergies  Allergen Reactions   Prednisone Other (See Comments)    Confusion, dizziness Medrol is ok   Other Other (See Comments)    Patient states that she cannot have any spicy foods   Codeine Nausea Only        Tramadol Itching    Medications Reviewed Today     Reviewed by Chesley Mires, MD (Physician) on 11/16/22 at [place] List Status: <None>   Medication Order Taking? Sig Documenting Provider Last Dose Status Informant  albuterol (PROVENTIL) (2.5 MG/3ML) 0.083% nebulizer solution [id number] Yes Take 3 mLs (2.5 mg total) by nebulization every 6 (six) hours as needed for wheezing or shortness of breath. Parrett, Fonnie Mu, NP Taking Active Self, Pharmacy Records  albuterol (VENTOLIN HFA) 108 (90 Base) MCG/ACT inhaler [id number] Yes INHALE 1-2 PUFFS INTO THE LUNGS EVERY 6 HOURS AS NEEDED. Tanda Rockers, MD Taking Active   ALPRAZolam Duanne Moron) 0.5 MG tablet [id number] Yes TAKE 1/2 TO 1 TABLET BY MOUTH 3 TIMES DAILY AS NEEDED FOR ANXIETY Midge Minium, MD Taking Active   amLODipine (NORVASC) 5 MG tablet [id number] Yes TAKE 1 TABLET BY MOUTH DAILY Midge Minium, MD Taking Active   benzonatate (TESSALON) 200 MG capsule [id number] Yes Take 1 capsule (200 mg total) by mouth 3 (three) times daily as needed for cough. Parrett, Fonnie Mu, NP Taking Active Self, Pharmacy Records  budesonide-formoterol [facility]) 80-4.5 MCG/ACT inhaler [id number] Yes Inhale 2 puffs into the lungs 2 (two) times daily. Chesley Mires, MD  Active   Dupilumab (DUPIXENT) 300 MG/2ML SOPN [id number] Yes Inject 300 mg into the skin every 14 (fourteen) days. Chesley Mires, MD Taking Active   escitalopram (LEXAPRO) 10 MG tablet [id number] Yes TAKE 1 TABLET BY MOUTH DAILY Midge Minium, MD Taking Active Self, Pharmacy Records    Discontinued 12/18/11 [phone] (Error)            Med Note Domingo Cocking, CHRISTY L   Fri Dec 18, 2011  8:42 AM) Dr Marlyce Huge put pt on Zantac   fluticasone (FLONASE) 50 MCG/ACT nasal spray [id number] Yes PLACE 1 SPRAY INTO BOTH NOSTRILS DAILY.  Chesley Mires, MD Taking Active Self, Pharmacy Records           Med Note Broadus John, [facility]   Mon Sep 30, 2020  3:55 PM)    ibuprofen (ADVIL) 200 MG tablet [id number] Yes Take 200 mg by mouth every 6 (six) hours as needed. [provider] Taking Active   levothyroxine (SYNTHROID) 112 MCG tablet [id number] Yes TAKE 1 TABLET BY MOUTH DAILY Midge Minium, MD Taking Active   meclizine (ANTIVERT) 25 MG tablet [id number] Yes TAKE 1 TABLET BY MOUTH 3 TIMES A DAY AS NEEDED FOR DIZZINESS [provider] Taking Active   pantoprazole ([place]) 40 MG tablet [id number] Yes TAKE 1 TABLET BY MOUTH EVERY DAY 30 MINUTES BEFORE Oda Kilts, MD Taking Active Self, Pharmacy Records            SDOH:  (Social Determinants of Health) assessments and interventions performed: {yes/no:20286} SDOH Interventions    Flowsheet Row Clinical Support from 07/14/2022 in [place] at [facility] Visit from 07/04/2021 in [place] at [facility] Visit from 04/09/2021 in [place] at [place] Management from 01/15/2020 in [place] at [facility] Visit from 10/28/2017 in [place] at [facility] Visit from 06/03/2017 in [place] at [place] Interventions Intervention Not Indicated -- -- -- -- --  Housing Interventions Intervention Not Indicated -- -- -- -- --  Transportation Interventions Intervention Not Indicated -- -- -- -- --  Depression Interventions/Treatment  -- -- Currently on Treatment, Medication -- Medication Medication  Financial Strain Interventions Intervention Not Indicated -- -- Other (Comment)  [Will work on patient assistance for  COPD medications] -- --  Physical Activity Interventions Intervention Not Indicated Intervention Not Indicated -- -- -- --  Stress Interventions Intervention Not Indicated -- -- -- -- --  Social Connections Interventions Intervention Not Indicated -- -- -- -- --       Medication Assistance: {MEDASSISTANCEINFO:25044}  Medication Access: Within the past 30 days, how often has patient missed a dose of medication? *** Is a pillbox or other method used to improve adherence? {YES/NO:21197} Factors that may affect medication adherence? {CHL DESC; BARRIERS:21522} Are meds synced by current pharmacy? {YES/NO:21197} Are meds delivered by current pharmacy? {YES/NO:21197} Does patient experience delays in picking up medications due to transportation concerns? {YES/NO:21197}  [facility] Reviewed: Is patient disadvantaged to use [facility]?: {YES/NO:21197} Current Rx insurance plan: *** Name and location of Current pharmacy:  [facility] ([place], [place] [facility] [place] [address] [address] Phone: [phone] Fax: [phone]  [facility] - [place], [place] [address] [place] [address] Phone: [phone] Fax: [place], [place] [address] Phone: [phone] Fax: [phone]  [id number] [place] - [address] [place] [address]Phone: [phone]: [address] [address]Phone: [phone]: [phone] [facility] services reviewed with patient today?: {YES/NO:21197} Patient requests to transfer care to [facility]?: {YES/NO:21197} Reason  patient declined to change pharmacies:  {US patient preference:27474}  Compliance/Adherence/Medication fill history: Care Gaps: ***  Star-Rating Drugs: ***   Assessment/Plan     mprehensive medication review performed; medication list updated in electronic medical record   Hypertension(BP goal <140/90) 06/03/22 -Controlled, has not checked at home -OSA on CPAP - no issues with compliance -Current treatment: Amlodipine 5 mg once daily Appropriate, Effective, Safe, Accessible -Current exercise: same se previous -Denies hypotensive/hypertensive symptoms -Educated on BP goals and benefits of medications for prevention of heart attack, stroke and kidney damage; -Counseled to monitor BP at home 1-2x/week, document, and provide log at future appointments -Recommended to continue current medication -Denies any dizziness or HA.  She is having some fatigue.  Hx of low Vitamin D she has not had levels checked in over a year.  Update 12/03/21 120/77 P 70, 117/68 P 40 Patient did report a BP reading with [id number] systolic but thinks it was her BP meter.  She has changed her batteries and reporting more normal readings as of today. She does mention she has had headache lately. Have asked her to continue to monitor BP and report any elevated readings to me. She mentions she is having some swelling in her legs which could be related to amlodipine.  Have asked her to monitor her weight to make sure she is not retaining too much fluid. She requests refill on lasix which she is out of. Encouraged her to come in for visit if headache continues or BP remains becomes elevated at home. No other changes at this time.   Vitamin D Deficiency (Goal: Vitamin D level > 30) 06/03/22 -Query Controlled -Current treatment  None -Medications previously tried: Vitamin D 50,000 IU weekly x 12 weeks. -Took this for a year but did not supplement after.  Due for rechecked vitamin D.  Patient said she was going to call and reschedule these lab visits.   -Check Vitamin D - treat accordingly  Depression (Goal: minimize symptoms) -Controlled, not assessed Feels there is ongoing benefit with escitalopram 5 mg, but does note some ongoing symptoms of depression. Often thinks about loss of grandson, who had lived with patient. He passed away 9 months ago.  -Current treatment: Escitalopram 10 mg once daily Appropriate, Effective, Safe, Accessible Xanax 0.5 mg - 1/2 to 1 tablet tid Appropriate, Effective, Safe, Accessible -PHQ9: 10 -Educated on Benefits of medication for symptom control -Recommended to continue current medication Agreed to increase celexa to 10 mg daily - sent to mail order pharmacy. Will double up on escitalopram 5 mg (two tabs daily) until escitalopram 10 mg comes in through mail order. CPA pt call two weeks to review tolerability. PHQ9/GAD7 next month.   Update 12/03/21 Patient mood seems to be improving from a tough year where she lost her grandson. Continues on increased dose of Lexapro with no concerns, no changes needed at this time. 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 Had visit with Dr Halford Chessman recently and plan to possible deescalate Symbicort next OV.  -Current treatment  Symbicort 160/45 2 puffs BID (PAP) Dupixent 300 mg injection every 14 days (PAP) -Exacerbations requiring treatment in last 6 months: 0 -Patient denies consistent use of maintenance inhaler -Frequency of rescue inhaler use: n/a -Counseled on Proper inhaler technique; Benefits of consistent maintenance inhaler use -Recommended to continue current medication Assessed patient finances. Will be automatically renewed for  AZ&ME PAP - however pulm will need to send new erx - can send to [facility] [place] the contracted mail order pharmacy. Messaged office.          Beverly Milch,  PharmD Clinical Pharmacist  [facility] [phone]

## 2022-12-14 ENCOUNTER — Telehealth: Payer: Self-pay | Admitting: Family Medicine

## 2022-12-14 NOTE — Telephone Encounter (Signed)
error 

## 2022-12-17 ENCOUNTER — Encounter (HOSPITAL_BASED_OUTPATIENT_CLINIC_OR_DEPARTMENT_OTHER): Payer: Self-pay | Admitting: Pulmonary Disease

## 2022-12-17 MED ORDER — METHYLPREDNISOLONE 4 MG PO TBPK: ORAL_TABLET | ORAL | 0 refills | Status: DC

## 2022-12-17 NOTE — Telephone Encounter (Signed)
Attempted to call pt but unable to reach. Left message to return call.  

## 2022-12-17 NOTE — Telephone Encounter (Signed)
Pt returned call. Pt said that her symptoms began 2 days ago. States she was around her grandson who had RSV and pneumonia.  One day ago pt started developing a sore throat and said she is now coughing getting up light yellow phlegm. Pt also has complaints of wheezing. Stated that she has used her rescue inhaler at least 1-2 times today.  Pt denies any complaints of fever. Pt states she has not done a covid test but will do one to be on the safe side to make sure the results are negative.  With pt's history, she wants to know what could be recommended. Dr. Halford Chessman, please advise.

## 2022-12-17 NOTE — Telephone Encounter (Signed)
Called and spoke with pt letting her know recs per VS and she verbalized understanding. Pt states she does want medrol dose pack sent to pharmacy for her so Rx has been sent to preferred pharmacy. Nothing further needed.

## 2022-12-17 NOTE — Telephone Encounter (Signed)
She should continue using symbicort twice per day, and use albuterol twice per day in between when she uses symbicort.  She should continue this regimen until her wheezing is better.  If her symptoms progress she should call back to reassess whether she needs antibiotics, but don't think she needs antibiotics now.  Can send a script for medrol dose pack if she feels her wheezing is really bad, but has not tolerated steroids well in the past.

## 2022-12-18 ENCOUNTER — Ambulatory Visit (INDEPENDENT_AMBULATORY_CARE_PROVIDER_SITE_OTHER): Payer: Medicare Other

## 2022-12-18 ENCOUNTER — Ambulatory Visit: Payer: Self-pay

## 2022-12-18 ENCOUNTER — Encounter: Payer: Self-pay | Admitting: Orthopedic Surgery

## 2022-12-18 ENCOUNTER — Ambulatory Visit: Payer: Medicare Other | Admitting: Orthopedic Surgery

## 2022-12-18 DIAGNOSIS — M79602 Pain in left arm: Secondary | ICD-10-CM

## 2022-12-18 DIAGNOSIS — M19012 Primary osteoarthritis, left shoulder: Secondary | ICD-10-CM | POA: Diagnosis not present

## 2022-12-18 MED ORDER — BUPIVACAINE HCL 0.5 % IJ SOLN
9.0000 mL | INTRAMUSCULAR | Status: AC | PRN
  Administered 2022-12-18: 9 mL via INTRA_ARTICULAR

## 2022-12-18 MED ORDER — LIDOCAINE HCL 1 % IJ SOLN
5.0000 mL | INTRAMUSCULAR | Status: AC | PRN
  Administered 2022-12-18: 5 mL

## 2022-12-18 MED ORDER — METHYLPREDNISOLONE ACETATE 40 MG/ML IJ SUSP
40.0000 mg | INTRAMUSCULAR | Status: AC | PRN
  Administered 2022-12-18: 40 mg via INTRA_ARTICULAR

## 2022-12-18 NOTE — Progress Notes (Signed)
Office Visit Note   Patient: Jenny [name]           Date of Birth: 1941/01/12           MRN: [id number] Visit Date: 12/18/2022 Requested by: Midge Minium, MD [address] [place],  [place] [address] PCP: Midge Minium, MD  Subjective: Chief Complaint  Patient presents with   Left Shoulder - Pain   Neck - Pain    HPI: Jenny [name] is a 82 y.o. female who presents to the office reporting rather severe left shoulder and neck pain.  She has a history of chronic pain but has been significantly worse over the past 3 weeks.  Denies any history of injury.  Describes pain radiating from the neck into the scapula which wakes her from sleep at night.  She also has some noted left shoulder rotator cuff problems.  Has had prior shoulder injections which has helped.  Describes coarseness in the shoulder.  Denies any AC joint tenderness.  Takes ibuprofen for symptoms..                ROS: All systems reviewed are negative as they relate to the chief complaint within the history of present illness.  Patient denies fevers or chills.  Assessment & Plan: Visit Diagnoses:  1. 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  Plan is MRI cervical spine to evaluate left-sided radiculopathy likely from C6-7 adjacent segment disease.  Injection is likely to follow.  Patient's not on blood thinners.  Injection performed into the shoulder joint today under ultrasound guidance.  I will call her with the results of the cervical spine MRI and we will likely get her set up to see Dr. Ernestina Patches for further evaluation and management and or possibly Dr. Laurance Flatten  Follow-Up Instructions: No follow-ups on file.   Orders:  Orders Placed This Encounter  Procedures   XR Shoulder Left   XR Cervical Spine 2 or 3 views   US Guided Needle Placement - No Linked Charges   MR Cervical Spine w/o contrast   No orders of the defined types were placed in  this encounter.     Procedures: Large Joint Inj: L glenohumeral on 12/18/2022 7:11 PM Indications: diagnostic evaluation and pain Details: 18 G 1.5 in needle, ultrasound-guided posterior approach  Arthrogram: No  Medications: 9 mL bupivacaine 0.5 %; 40 mg methylPREDNISolone acetate 40 MG/ML; 5 mL lidocaine 1 % Outcome: tolerated well, no immediate complications Procedure, treatment alternatives, risks and benefits explained, specific risks discussed. Consent was given by the patient. Immediately prior to procedure a time out was called to verify the correct patient, procedure, equipment, support staff and site/side marked as required. Patient was prepped and draped in the usual sterile fashion.       Clinical Data: No additional findings.  Objective: Vital Signs: There were no vitals taken for this visit.  Physical Exam:  Constitutional: Patient appears well-developed HEENT:  Head: Normocephalic Eyes:EOM are normal Neck: Normal range of motion Cardiovascular: Normal rate Pulmonary/chest: Effort normal Neurologic: Patient is alert Skin: Skin is warm Psychiatric: Patient has normal mood and affect  Ortho Exam: Ortho exam demonstrates full active and passive range of motion of the elbows and wrist on the left.  She does have a little crepitus with internal/external rotation of that left shoulder but pretty reasonable rotator cuff strength infraspinatus supraspinatus and subscap muscle testing.  Motor or sensory function of both hands intact with 5 out of 5 grip EPL FPL interosseous wrist flexion extension bicep triceps and deltoid strength.  No definite paresthesias C5-T1.  Neck range of motion particularly restricted with flexion and extension but only minimally.  Specialty Comments:  No specialty comments available.  Imaging: No results found.   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   Hyperglycemia 07/07/2015   Annual physical exam 08/06/2013   Atrophic vaginitis 06/17/2012   Peripheral vascular disease, unspecified ([place]) 06/02/2012   Urticaria 10/15/2011   Anemia 09/21/2011   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   Vitamin D deficiency 02/12/2011   Peripheral neuropathy 02/12/2011   Intertrigo [id number]   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   History of chicken pox 01/14/2011   History of measles 01/14/2011   OTHER URINARY INCONTINENCE 03/12/2010   Depression with anxiety 03/06/2008   Essential hypertension 02/22/2008   Chronic bronchitis ([place]) 02/22/2008   DIVERTICULOSIS OF COLON 02/22/2008   Unilateral primary osteoarthritis, left knee 02/22/2008   Hypothyroidism 08/23/2007   Morbid obesity ([place]) 08/23/2007   OSA on CPAP 08/23/2007   Venous (peripheral) insufficiency 08/23/2007   Allergic rhinitis 08/23/2007   GERD without esophagitis 08/23/2007   BACK PAIN, LUMBAR 08/23/2007   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   Anemia 09/21/2011   Asthma 01/14/2011   Atrophic vaginitis 06/17/2012   BACK PAIN, LUMBAR 08/23/2007   DEGENERATIVE JOINT DISEASE 02/22/2008   Depression with anxiety 03/06/2008   Qualifier: Diagnosis of  By: Lenna Gilford MD, Deborra Medina    DIVERTICULOSIS OF COLON 02/22/2008   GERD (gastroesophageal reflux disease)    HYPERTENSION 02/22/2008   HYPOTHYROIDISM 08/23/2007   Interstitial lung disease ([place])    OBESITY 08/23/2007   Peripheral neuropathy 02/12/2011   possibly related to spondylolisthesis per pt   Pneumonia due to COVID-19 virus 09/2020    PONV (postoperative nausea and vomiting)    Sleep apnea    VENOUS INSUFFICIENCY 08/23/2007   VITAMIN D DEFICIENCY 08/21/2008    Family History  Problem Relation Age of Onset   COPD Mother    Pneumonia Mother    Other Mother        CHF   Hypertension Mother    Arthritis Mother        septic knee after replacement   Heart disease Mother        chf   Dementia Father    Arthritis Brother    Asthma Daughter    Colon polyps Daughter    Stroke Maternal Grandmother    Heart disease Maternal Grandmother    Other Maternal Grandfather        CHF   Heart disease Maternal Grandfather        chf   Cancer Paternal Grandmother        stomach?   Other Paternal Grandfather        problems with kidneys   Kidney disease Paternal Grandfather    Anxiety disorder Daughter    Cancer Maternal Aunt  breast   Cancer Maternal Aunt        liver   Colon cancer Neg Hx     Past Surgical History:  Procedure Laterality Date   ABDOMINAL HYSTERECTOMY     took one ovary   AP repair and sling for cystocele  8-04    Dr Gaynelle Arabian   APPENDECTOMY     carpal tunel  2008   CHOLECYSTECTOMY     ctr     bilateral   EYE SURGERY     left hand  2010   left thumb, joint removal, CTR   NECK SURGERY     x 2 - discectomy, fusion   SHOULDER ARTHROSCOPY DISTAL CLAVICLE EXCISION AND OPEN ROTATOR CUFF REPAIR     right   TONSILLECTOMY AND ADENOIDECTOMY     TUBAL LIGATION     Social History   Occupational History   Occupation: Retired  Tobacco Use   Smoking status: Former    Packs/day: 1.50    Years: 30.00    Total pack years: 45.00    Types: Cigarettes    Quit date: 10/13/1991    Years since quitting: 31.2   Smokeless tobacco: Never  Vaping Use   Vaping Use: Never used  Substance and Sexual Activity   Alcohol use: No    Alcohol/week: 0.0 standard drinks of alcohol   Drug use: No   Sexual activity: Never

## 2022-12-21 ENCOUNTER — Encounter (HOSPITAL_BASED_OUTPATIENT_CLINIC_OR_DEPARTMENT_OTHER): Payer: Self-pay | Admitting: Pulmonary Disease

## 2022-12-22 ENCOUNTER — Telehealth: Payer: Self-pay | Admitting: Pharmacist

## 2022-12-22 NOTE — Progress Notes (Signed)
Care Management & Coordination Services Pharmacy Team   Reason for Encounter: Appointment Reminder   Contacted patient to confirm telephone appointment with [name], PharmD on 12/23/22 at 2:45PM. Unsuccessful outreach. Left voicemail for patient to return call.  Future Appointments  Date Time Provider [place]  12/23/2022  2:45 PM Edythe Clarity, [place] [facility] None  01/10/2023  9:40 AM [facility] MR 3 [facility] [facility]  02/02/2023 10:15 AM Trula Slade, DPM [facility]  07/29/2023  8:15 AM [facility] CO[facility] [place], [facility]

## 2022-12-23 ENCOUNTER — Ambulatory Visit: Payer: Medicare Other | Admitting: Orthopedic Surgery

## 2022-12-23 ENCOUNTER — Ambulatory Visit: Payer: Medicare Other | Admitting: Pharmacist

## 2022-12-23 NOTE — Progress Notes (Signed)
Care Management & Coordination Services Pharmacy Note  12/23/2022 Name:  Jenny [name] MRN:  [id number] DOB:  Oct 14, 1940  Summary: PharmD FU visit.  Doing well overall - Symbicort recently reduced, patient taking maintenance inhaler as appropriate.  Rarely having to use her rescue inhaler.  BP controlled.  Recommendations/Changes made from today's visit: No changes  Follow up plan: FU 6 months   Subjective: Jenny [name] is an 82 y.o. year old female who is a primary patient of [name], [name], MD.  The care coordination team was consulted for assistance with disease management and care coordination needs.    Engaged with patient by telephone for follow up visit.  Recent office visits:  None noted.    Recent consult visits:  01/02/22 Gloriann Loan PA-C - Orthopedics - Unilateral primary Osteoarthritis - Jont injection administered in office. Follow up in 6 months.    Hospital visits:  None in previous 6 months   Objective:  Lab Results  Component Value Date   CREATININE 0.99 04/02/2022   BUN 18 04/02/2022   GFR 58.80 (L) 04/09/2021   GFRNONAA 57 (L) 04/02/2022   GFRAA 59 (L) 05/07/2020   NA 140 04/02/2022   K 4.8 04/02/2022   CALCIUM 9.0 04/02/2022   CO2 28 04/02/2022   GLUCOSE 104 (H) 04/02/2022    Lab Results  Component Value Date/Time   HGBA1C 6.2 (H) 09/30/2020 06:02 AM   HGBA1C 5.8 11/01/2015 08:27 AM   GFR 58.80 (L) 04/09/2021 02:07 PM   GFR 56.43 (L) 01/30/2020 09:39 AM   MICROALBUR 1.3 01/14/2011 11:05 AM    Last diabetic Eye exam: No results found for: "HMDIABEYEEXA"  Last diabetic Foot exam: No results found for: "HMDIABFOOTEX"   Lab Results  Component Value Date   CHOL 131 04/09/2021   HDL 37.90 (L) 04/09/2021   LDLCALC 68 04/09/2021   TRIG 127.0 04/09/2021   CHOLHDL 3 04/09/2021       Latest Ref Rng & Units 04/09/2021    2:07 PM 10/04/2020    4:30 AM 10/03/2020   10:28 AM  Hepatic Function  Total Protein 6.0 - 8.3 g/dL 6.4   6.2  6.7   Albumin 3.5 - 5.2 g/dL 4.0  3.3  3.6   AST 0 - 37 U/L '14  31  31   '$ ALT 0 - 35 U/L 9  37  26   Alk Phosphatase 39 - 117 U/L 83  73  70   Total Bilirubin 0.2 - 1.2 mg/dL 0.7  0.4  0.5   Bilirubin, Direct 0.0 - 0.3 mg/dL 0.2       Lab Results  Component Value Date/Time   TSH 1.300 04/02/2022 04:09 PM   TSH 1.60 04/09/2021 02:07 PM   TSH 3.66 12/05/2019 09:43 AM   FREET4 1.45 08/03/2013 10:56 AM   FREET4 1.05 02/08/2013 09:39 AM       Latest Ref Rng & Units 04/02/2022   10:19 AM 04/09/2021    2:07 PM 10/04/2020    4:30 AM  CBC  WBC 4.0 - 10.5 K/uL 6.6  9.8  7.6   Hemoglobin 12.0 - 15.0 g/dL 14.4  13.4  14.3   Hematocrit 36.0 - 46.0 % 44.6  40.3  43.9   Platelets 150 - 400 K/uL 273  270.0  226     Lab Results  Component Value Date/Time   VD25OH 24.64 (L) 04/09/2021 02:07 PM   VD25OH 17.35 (L) 11/09/2019 09:31 AM   VITAMINB12 220 09/21/2011 03:09  PM    Clinical ASCVD: No  The ASCVD Risk score (Arnett DK, et al., 2019) failed to calculate for the following reasons:   The 2019 ASCVD risk score is only valid for ages 96 to 68        07/14/2022   12:51 PM 04/02/2022    8:34 AM 08/20/2021    4:09 PM  Depression screen PHQ 2/9  Decreased Interest 0 0 2  Down, Depressed, Hopeless 0 0 2  PHQ - 2 Score 0 0 4  Altered sleeping  0 2  Tired, decreased energy  0 1  Change in appetite  0 1  Feeling bad or failure about yourself   0 0  Trouble concentrating  0 2  Moving slowly or fidgety/restless  0 0  Suicidal thoughts  0 0  PHQ-9 Score  0 10  Difficult doing work/chores  Not difficult at all Somewhat difficult     Social History   Tobacco Use  Smoking Status Former   Packs/day: 1.50   Years: 30.00   Total pack years: 45.00   Types: Cigarettes   Quit date: 10/13/1991   Years since quitting: 31.2  Smokeless Tobacco Never   BP Readings from Last 3 Encounters:  11/16/22 116/82  05/22/22 (!) 146/71  04/02/22 (!) 145/74   Pulse Readings from Last 3  Encounters:  11/16/22 79  05/22/22 74  04/02/22 63   Wt Readings from Last 3 Encounters:  11/16/22 209 lb 12.8 oz (95.2 kg)  07/14/22 200 lb (90.7 kg)  05/22/22 206 lb (93.4 kg)   BMI Readings from Last 3 Encounters:  11/16/22 36.01 kg/m  07/14/22 34.33 kg/m  05/22/22 35.36 kg/m    Allergies  Allergen Reactions   Prednisone Other (See Comments)    Confusion, dizziness Medrol is ok   Other Other (See Comments)    Patient states that she cannot have any spicy foods   Codeine Nausea Only        Tramadol Itching    Medications Reviewed Today     Reviewed by Edythe Clarity, [facility] (Pharmacist) on 12/23/22 at 8  Med List Status: <None>   Medication Order Taking? Sig Documenting Provider Last Dose Status Informant  albuterol (PROVENTIL) (2.5 MG/3ML) 0.083% nebulizer solution [id number] Yes Take 3 mLs (2.5 mg total) by nebulization every 6 (six) hours as needed for wheezing or shortness of breath. Parrett, Fonnie Mu, NP Taking Active Self, Pharmacy Records  albuterol (VENTOLIN HFA) 108 (90 Base) MCG/ACT inhaler [id number] Yes INHALE 1-2 PUFFS INTO THE LUNGS EVERY 6 HOURS AS NEEDED. Tanda Rockers, MD Taking Active   ALPRAZolam Duanne Moron) 0.5 MG tablet [id number] Yes TAKE 1/2 TO 1 TABLET BY MOUTH 3 TIMES DAILY AS NEEDED FOR ANXIETY Midge Minium, MD Taking Active   amLODipine (NORVASC) 5 MG tablet [id number] Yes TAKE 1 TABLET BY MOUTH DAILY Midge Minium, MD Taking Active   benzonatate (TESSALON) 200 MG capsule [id number] Yes Take 1 capsule (200 mg total) by mouth 3 (three) times daily as needed for cough. Parrett, Fonnie Mu, NP Taking Active Self, Pharmacy Records  budesonide-formoterol [facility]) 80-4.5 MCG/ACT inhaler [id number] Yes Inhale 2 puffs into the lungs 2 (two) times daily. Chesley Mires, MD Taking Active   Dupilumab ([place]) 300 MG/2ML SOPN [id number] Yes Inject 300 mg into the skin every 14 (fourteen) days. Chesley Mires, MD Taking Active   escitalopram (LEXAPRO)  10 MG tablet [id number] Yes TAKE 1 TABLET BY MOUTH DAILY Midge Minium, MD Taking Active  Self, Pharmacy Records    Discontinued 12/18/11 [phone] (Error)            Med Note Domingo Cocking, CHRISTY L   Fri Dec 18, 2011  8:42 AM) Dr Marlyce Huge put pt on Zantac  fluticasone (FLONASE) 50 MCG/ACT nasal spray [id number] Yes PLACE 1 SPRAY INTO BOTH NOSTRILS DAILY. Chesley Mires, MD Taking Active Self, Pharmacy Records           Med Note Broadus John, [facility]   Mon Sep 30, 2020  3:55 PM)    ibuprofen (ADVIL) 200 MG tablet [id number] Yes Take 200 mg by mouth every 6 (six) hours as needed. [provider] Taking Active   levothyroxine (SYNTHROID) 112 MCG tablet [id number] Yes TAKE 1 TABLET BY MOUTH DAILY Midge Minium, MD Taking Active   meclizine (ANTIVERT) 25 MG tablet [id number] Yes TAKE 1 TABLET BY MOUTH 3 TIMES A DAY AS NEEDED FOR DIZZINESS [provider] Taking Active   methylPREDNISolone (MEDROL DOSEPAK) 4 MG TBPK tablet [id number] Yes Take as directed. Chesley Mires, MD Taking Active   pantoprazole (PROTONIX) 40 MG tablet [id number] Yes TAKE 1 TABLET BY MOUTH EVERY DAY 30 MINUTES BEFORE Oda Kilts, MD Taking Active Self, Pharmacy Records            SDOH:  (Social Determinants of Health) assessments and interventions performed: No, done within year Financial Resource Strain: Low Risk  (07/14/2022)   Overall Financial Resource Strain (CARDIA)    Difficulty of Paying Living Expenses: Not hard at all   Food Insecurity: No Food Insecurity (07/14/2022)   Hunger Vital Sign    Worried About Running Out of Food in the Last Year: Never true    Ran Out of Food in the Last Year: Never true    SDOH Interventions    Flowsheet Row Clinical Support from 07/14/2022 in [place] at [facility] Visit from 07/04/2021 in [place] at [facility] Visit from 04/09/2021 in [place] at [facility] Management from 01/15/2020 in [place] at [facility] Visit from 10/28/2017 in [place] at [facility] Visit from 06/03/2017 in [place] at [facility] Interventions Intervention Not Indicated -- -- -- -- --  Housing Interventions Intervention Not Indicated -- -- -- -- --  Transportation Interventions Intervention Not Indicated -- -- -- -- --  Depression Interventions/Treatment  -- -- Currently on Treatment, Medication -- Medication Medication  Financial Strain Interventions Intervention Not Indicated -- -- Other (Comment)  [Will work on patient assistance for COPD medications] -- --  Physical Activity Interventions Intervention Not Indicated Intervention Not Indicated -- -- -- --  Stress Interventions Intervention Not Indicated -- -- -- -- --  Social Connections Interventions Intervention Not Indicated -- -- -- -- --       Medication Assistance: None required.  Patient affirms current coverage meets needs.  Medication Access: Within the past 30 days, how often has patient missed a dose of medication? 0 Is a pillbox or other method used to improve adherence? Yes  Factors that may affect medication adherence? no barriers identified Are meds synced by current pharmacy? Yes  Are meds delivered by current pharmacy? No  Does patient experience delays in picking up medications due to transportation concerns? No   [facility] Services Reviewed: Is patient disadvantaged to use [facility]?: Yes  Current  Rx insurance plan: [place] Name and location of Current pharmacy:  [facility] ([place], [place] [facility] [address] [address] Phone: [phone] Fax: [place], [address] [place]  [address] Phone: [phone] Fax: [place], [place] [address] [place] [address] Phone: [phone] Fax: [phone]  [id number] [place] - [address] [place] [address]Phone: [phone]: [address] [address] [address]Phone: [phone]: [phone] [facility] services reviewed with patient today?: Yes  Patient requests to transfer care to [facility]?: No  Reason patient declined to change pharmacies: Disadvantaged due to insurance/mail order  Compliance/Adherence/Medication fill history: Care Gaps: N/A  Star-Rating Drugs: None   Assessment/Plan     Hypertension(BP goal <140/90) 06/03/22 -Controlled, not assessed -OSA on CPAP - no issues with compliance -Current treatment: Amlodipine 5 mg once daily Appropriate, Effective, Safe, Accessible -Current exercise: active around house some - push mowed her yard today and raked leaves -Denies hypotensive/hypertensive symptoms -Educated on BP goals and benefits of medications for prevention of heart attack, stroke and kidney damage; -Counseled to monitor BP at home 1-2x/week, document, and provide log at future appointments -Recommended to continue current medication -Denies any dizziness or HA.  She is having some fatigue.  Hx of low Vitamin D she has not had levels checked in over a year.  Update 12/03/21 120/77 P 70, 117/68 P 40 Patient did report a BP reading with [id number] but thinks it was her BP meter.  She has changed her batteries and reporting more normal readings as of today. She does mention she has had headache lately. Have asked her to continue to monitor BP and report any elevated readings to me. She mentions she is having some swelling in her  legs which could be related to amlodipine.  Have asked her to monitor her weight to make sure she is not retaining too much fluid. She requests refill on lasix which she is out of. Encouraged her to come in for visit if headache continues or BP remains becomes elevated at home. No other changes at this time.   Vitamin D Deficiency (Goal: Vitamin D level > 30) 06/03/22 -Query Controlled -Current treatment  None -Medications previously tried: Vitamin D 50,000 IU weekly x 12 weeks. -Took this for a year but did not supplement after.  Due for rechecked vitamin D.  Patient said she was going to call and reschedule these lab visits.  -Check Vitamin D - treat accordingly  Depression (Goal: minimize symptoms) 12/23/22 -Controlled, not assessed Often thinks about loss of grandson, who had lived with patient.  -Current treatment: Escitalopram 10 mg once daily Appropriate, Effective, Safe, Accessible Xanax 0.5 mg - 1/2 to 1 tablet tid Appropriate, Effective, Safe, Accessible -PHQ9: 10 -Educated on Benefits of medication for symptom control -Recommended to continue current medication    07/14/2022   12:51 PM 04/02/2022    8:34 AM 08/20/2021    4:09 PM  PHQ9 SCORE ONLY  PHQ-9 Total Score 0 0 10  Depression seems to be well controlled.  Has another grandson, his wife and  their daughter living with her.  Continue same medications.  She had a brief interruption with Lexapro prescription but that has since resolved.  No changes needed.   Update 12/03/21 Patient mood seems to be improving from a tough year where she lost her grandson. Continues on increased dose of Lexapro with no concerns, no changes needed at this time. 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 Had visit with Dr Halford Chessman recently and plan to possible deescalate Symbicort next OV.   -Current treatment  Symbicort 80/45 2 puffs BID Appropriate, Effective, Safe, Accessible Albuterol HFA 29mg prn Appropriate, Effective, Safe, Accessible Dupixent 300 mg injection every 14 days Appropriate, Effective, Safe, Accessible -Exacerbations requiring treatment in last 6 months: 0 -Patient denies consistent use of maintenance inhaler -Frequency of rescue inhaler use: n/a -Counseled on Proper inhaler technique; Benefits of consistent maintenance inhaler use Symbicort PAP discontinues.  Recently decreased her dose of Symbicort.  She confirms that she is still using twice daily.  Breathing is well controlled. No changes at this time.  Continue current medication regimen.  Stressed compliance!  Call me if price changes.  Patient Goals/Self-Care Activities Patient will: - take medications as prescribed collaborate with provider on medication access solutions           CBeverly Milch PharmD Clinical Pharmacist  [facility]([phone]

## 2023-01-08 ENCOUNTER — Other Ambulatory Visit: Payer: Self-pay | Admitting: Family Medicine

## 2023-01-10 ENCOUNTER — Other Ambulatory Visit: Payer: Medicare Other

## 2023-01-10 ENCOUNTER — Ambulatory Visit
Admission: RE | Admit: 2023-01-10 | Discharge: 2023-01-10 | Disposition: A | Discharge status: Home / Self Care | Payer: Medicare Other | Source: Ambulatory Visit | Attending: Orthopedic Surgery | Admitting: Orthopedic Surgery

## 2023-01-10 DIAGNOSIS — M4802 Spinal stenosis, cervical region: Secondary | ICD-10-CM | POA: Diagnosis not present

## 2023-01-10 DIAGNOSIS — M542 Cervicalgia: Secondary | ICD-10-CM | POA: Diagnosis not present

## 2023-01-10 DIAGNOSIS — M79602 Pain in left arm: Secondary | ICD-10-CM

## 2023-01-11 NOTE — Telephone Encounter (Signed)
Xanax 0.5 mg LOV: 04/02/22 Last Refill:11/18/22 Upcoming appt: 07/29/23

## 2023-01-14 ENCOUNTER — Encounter: Payer: Self-pay | Admitting: Orthopedic Surgery

## 2023-01-15 ENCOUNTER — Encounter: Payer: Self-pay | Admitting: Orthopedic Surgery

## 2023-01-15 NOTE — Telephone Encounter (Signed)
Per her request can you hold off on shoulder scan thx

## 2023-01-18 ENCOUNTER — Other Ambulatory Visit: Payer: Self-pay

## 2023-01-18 DIAGNOSIS — M79602 Pain in left arm: Secondary | ICD-10-CM

## 2023-01-28 ENCOUNTER — Ambulatory Visit: Payer: Medicare Other | Admitting: Physical Medicine and Rehabilitation

## 2023-01-28 ENCOUNTER — Other Ambulatory Visit: Payer: Self-pay

## 2023-01-28 VITALS — BP 130/73 | HR 85

## 2023-01-28 DIAGNOSIS — M5412 Radiculopathy, cervical region: Secondary | ICD-10-CM | POA: Diagnosis not present

## 2023-01-28 MED ORDER — METHYLPREDNISOLONE ACETATE 80 MG/ML IJ SUSP
80.0000 mg | Freq: Once | INTRAMUSCULAR | Status: AC
Start: 2023-01-28 — End: 2023-01-28
  Administered 2023-01-28: 80 mg

## 2023-01-28 NOTE — Progress Notes (Signed)
Functional Pain Scale - descriptive words and definitions  Distracting (5)    Aware of pain/able to complete some ADL's but limited by pain/sleep is affected and active distractions are only slightly useful. Moderate range order  Average Pain 8-10   +Driver, -BT, -Dye Allergies.  Neck pain on left side that radiates into left shoulder and down left arm

## 2023-01-28 NOTE — Patient Instructions (Signed)

## 2023-02-02 ENCOUNTER — Ambulatory Visit: Payer: Medicare Other | Admitting: Podiatry

## 2023-02-02 DIAGNOSIS — G629 Polyneuropathy, unspecified: Secondary | ICD-10-CM | POA: Diagnosis not present

## 2023-02-02 DIAGNOSIS — Q828 Other specified congenital malformations of skin: Secondary | ICD-10-CM | POA: Diagnosis not present

## 2023-02-02 MED ORDER — GABAPENTIN 300 MG PO CAPS
300.0000 mg | ORAL_CAPSULE | Freq: Every day | ORAL | 0 refills | Status: DC
Start: 2023-02-02 — End: 2023-06-15

## 2023-02-02 NOTE — Progress Notes (Signed)
Subjective: Chief Complaint  Patient presents with   Callouses    3 month follow up, painful callus lesion     82 year old female presents the office today for concerns of calluses to her left foot.  She said the calluses are causing pain when she walks.  She said the symptoms reoccurred about 2 weeks ago.  She also states that at night she is getting discomfort to her feet injury feel cold at times and describes nerve type symptoms.  She previously was on gabapentin but she stopped taking it.  She states that she did not have any side effects to medication previously when she was on it.   Objective: AAO x3, NAD DP/PT pulses palpable bilaterally, CRT less than 3 seconds Hyperkeratotic lesions left foot submetatarsal 1, 5 without any underlying ulceration drainage or signs of infection.  No edema, erythema.  Prominent metatarsal heads. No pain with calf compression, swelling, warmth, erythema  Assessment: Hyperkeratotic lesions left foot; neuropathy  Plan: -All treatment options discussed with the patient including all alternatives, risks, complications.  -Sharply debrided the hyperkeratotic lesions x2 without any complications or bleeding. -We discussed different treatment options for neuropathy.  She had to go back on gabapentin.  She was previously on 3 mg and will restart this at nighttime.  We discussed side effects of this any issues to stop taking it let me know immediately. -Moisturizer, offloading. -Patient encouraged to call the office with any questions, concerns, change in symptoms.   Vivi Barrack DPM

## 2023-02-02 NOTE — Patient Instructions (Signed)
Gabapentin Capsules or Tablets ?What is this medication? ?GABAPENTIN (GA ba pen tin) treats nerve pain. It may also be used to prevent and control seizures in people with epilepsy. It works by calming overactive nerves in your body. ?This medicine may be used for other purposes; ask your health care provider or pharmacist if you have questions. ?COMMON BRAND NAME(S): Active-PAC with Gabapentin, Gabarone, Gralise, Neurontin ?What should I tell my care team before I take this medication? ?They need to know if you have any of these conditions: ?Alcohol or substance use disorder ?Kidney disease ?Lung or breathing disease ?Suicidal thoughts, plans, or attempt; a previous suicide attempt by you or a family member ?An unusual or allergic reaction to gabapentin, other medications, foods, dyes, or preservatives ?Pregnant or trying to get pregnant ?Breast-feeding ?How should I use this medication? ?Take this medication by mouth with a glass of water. Follow the directions on the prescription label. You can take it with or without food. If it upsets your stomach, take it with food. Take your medication at regular intervals. Do not take it more often than directed. Do not stop taking except on your care team's advice. ?If you are directed to break the 600 or 800 mg tablets in half as part of your dose, the extra half tablet should be used for the next dose. If you have not used the extra half tablet within 28 days, it should be thrown away. ?A special MedGuide will be given to you by the pharmacist with each prescription and refill. Be sure to read this information carefully each time. ?Talk to your care team about the use of this medication in children. While this medication may be prescribed for children as young as 3 years for selected conditions, precautions do apply. ?Overdosage: If you think you have taken too much of this medicine contact a poison control center or emergency room at once. ?NOTE: This medicine is only for  you. Do not share this medicine with others. ?What if I miss a dose? ?If you miss a dose, take it as soon as you can. If it is almost time for your next dose, take only that dose. Do not take double or extra doses. ?What may interact with this medication? ?Alcohol ?Antihistamines for allergy, cough, and cold ?Certain medications for anxiety or sleep ?Certain medications for depression like amitriptyline, fluoxetine, sertraline ?Certain medications for seizures like phenobarbital, primidone ?Certain medications for stomach problems ?General anesthetics like halothane, isoflurane, methoxyflurane, propofol ?Local anesthetics like lidocaine, pramoxine, tetracaine ?Medications that relax muscles for surgery ?Opioid medications for pain ?Phenothiazines like chlorpromazine, mesoridazine, prochlorperazine, thioridazine ?This list may not describe all possible interactions. Give your health care provider a list of all the medicines, herbs, non-prescription drugs, or dietary supplements you use. Also tell them if you smoke, drink alcohol, or use illegal drugs. Some items may interact with your medicine. ?What should I watch for while using this medication? ?Visit your care team for regular checks on your progress. You may want to keep a record at home of how you feel your condition is responding to treatment. You may want to share this information with your care team at each visit. You should contact your care team if your seizures get worse or if you have any new types of seizures. Do not stop taking this medication or any of your seizure medications unless instructed by your care team. Stopping your medication suddenly can increase your seizures or their severity. ?This medication may cause serious skin   reactions. They can happen weeks to months after starting the medication. Contact your care team right away if you notice fevers or flu-like symptoms with a rash. The rash may be red or purple and then turn into blisters or  peeling of the skin. Or, you might notice a red rash with swelling of the face, lips or lymph nodes in your neck or under your arms. ?Wear a medical identification bracelet or chain if you are taking this medication for seizures. Carry a card that lists all your medications. ?This medication may affect your coordination, reaction time, or judgment. Do not drive or operate machinery until you know how this medication affects you. Sit up or stand slowly to reduce the risk of dizzy or fainting spells. Drinking alcohol with this medication can increase the risk of these side effects. ?Your mouth may get dry. Chewing sugarless gum or sucking hard candy, and drinking plenty of water may help. ?Watch for new or worsening thoughts of suicide or depression. This includes sudden changes in mood, behaviors, or thoughts. These changes can happen at any time but are more common in the beginning of treatment or after a change in dose. Call your care team right away if you experience these thoughts or worsening depression. ?If you become pregnant while using this medication, you may enroll in the [place] Antiepileptic Drug Pregnancy Registry by calling 1-888-233-2334. This registry collects information about the safety of antiepileptic medication use during pregnancy. ?What side effects may I notice from receiving this medication? ?Side effects that you should report to your care team as soon as possible: ?Allergic reactions or angioedema--skin rash, itching, hives, swelling of the face, eyes, lips, tongue, arms, or legs, trouble swallowing or breathing ?Rash, fever, and swollen lymph nodes ?Thoughts of suicide or self harm, worsening mood, feelings of depression ?Trouble breathing ?Unusual changes in mood or behavior in children after use such as difficulty concentrating, hostility, or restlessness ?Side effects that usually do not require medical attention (report to your care team if they continue or are  bothersome): ?Dizziness ?Drowsiness ?Nausea ?Swelling of ankles, feet, or hands ?Vomiting ?This list may not describe all possible side effects. Call your doctor for medical advice about side effects. You may report side effects to FDA at 1-800-FDA-1088. ?Where should I keep my medication? ?Keep out of reach of children and pets. ?Store at room temperature between 15 and 30 degrees C (59 and 86 degrees F). Get rid of any unused medication after the expiration date. ?This medication may cause accidental overdose and death if taken by other adults, children, or pets. ?To get rid of medications that are no longer needed or have expired: ?Take the medication to a medication take-back program. Check with your pharmacy or law enforcement to find a location. ?If you cannot return the medication, check the label or package insert to see if the medication should be thrown out in the garbage or flushed down the toilet. If you are not sure, ask your care team. If it is safe to put it in the trash, empty the medication out of the container. Mix the medication with cat litter, dirt, coffee grounds, or other unwanted substance. Seal the mixture in a bag or container. Put it in the trash. ?NOTE: This sheet is a summary. It may not cover all possible information. If you have questions about this medicine, talk to your doctor, pharmacist, or health care provider. ?? 2023 Elsevier/Gold Standard (2021-03-31 00:00:00) ? ?

## 2023-02-07 NOTE — Progress Notes (Signed)
Jenny [name] - 82 y.o. female MRN [id number]  Date of birth: June 22, 1941  Office Visit Note: Visit Date: 01/28/2023 PCP: Sheliah Hatch, MD Referred by: Sheliah Hatch, MD  Subjective: Chief Complaint  Patient presents with   Neck - Pain   HPI:  Jenny [name] is a 82 y.o. female who comes in today at the request of Dr. Burnard Bunting for planned Left C7-T1 Cervical Interlaminar epidural steroid injection with fluoroscopic guidance.  The patient has failed conservative care including home exercise, medications, time and activity modification.  This injection will be diagnostic and hopefully therapeutic.  Please see requesting physician notes for further details and justification.   ROS Otherwise per HPI.  Assessment & Plan: Visit Diagnoses:    ICD-10-CM   1. Cervical radiculopathy  M54.12 XR C-ARM NO REPORT    Epidural Steroid injection    methylPREDNISolone acetate (DEPO-MEDROL) injection 80 mg      Plan: No additional findings.   Meds & Orders:  Meds ordered this encounter  Medications   methylPREDNISolone acetate (DEPO-MEDROL) injection 80 mg    Orders Placed This Encounter  Procedures   XR C-ARM NO REPORT   Epidural Steroid injection    Follow-up: Return for visit to requesting provider as needed.   Procedures: No procedures performed  Cervical Epidural Steroid Injection - Interlaminar Approach with Fluoroscopic Guidance  Patient: Jenny [name]      Date of Birth: Sep 06, 1941 MRN: [id number] PCP: Sheliah Hatch, MD      Visit Date: 01/28/2023   Universal Protocol:    Date/Time: 02/06/2409:53 AM  Consent Given By: the patient  Position: PRONE  Additional Comments: Vital signs were monitored before and after the procedure. Patient was prepped and draped in the usual sterile fashion. The correct patient, procedure, and site was verified.   Injection Procedure Details:   Procedure diagnoses: Cervical radiculopathy [M54.12]     Meds Administered:  Meds ordered this encounter  Medications   methylPREDNISolone acetate (DEPO-MEDROL) injection 80 mg     Laterality: Left  Location/Site: C7-T1  Needle: 3.5 in., 20 ga. Tuohy  Needle Placement: Paramedian epidural space  Findings:  -Comments: Excellent flow of contrast into the epidural space.  Procedure Details: Using a paramedian approach from the side mentioned above, the region overlying the inferior lamina was localized under fluoroscopic visualization and the soft tissues overlying this structure were infiltrated with 4 ml. of 1% Lidocaine without Epinephrine. A # 20 gauge, Tuohy needle was inserted into the epidural space using a paramedian approach.  The epidural space was localized using loss of resistance along with contralateral oblique bi-planar fluoroscopic views.  After negative aspirate for air, blood, and CSF, a 2 ml. volume of Isovue-250 was injected into the epidural space and the flow of contrast was observed. Radiographs were obtained for documentation purposes.   The injectate was administered into the level noted above.  Additional Comments:  The patient tolerated the procedure well Dressing: 2 x 2 sterile gauze and Band-Aid    Post-procedure details: Patient was observed during the procedure. Post-procedure instructions were reviewed.  Patient left the clinic in stable condition.   Clinical History: Narrative & Impression CLINICAL DATA:  Cervical pain for 3-4 months.   EXAM: MRI CERVICAL SPINE WITHOUT CONTRAST   TECHNIQUE: Multiplanar, multisequence MR imaging of the cervical spine was performed. No intravenous contrast was administered.   COMPARISON:  11/21/2017   FINDINGS: Alignment: Physiologic.   Vertebrae: No acute fracture, evidence of  discitis, or aggressive bone lesion.   Cord: Focal area of T2 hyperintensity in the cervical spinal cord at C4-5 with mild volume loss as can be seen with myelomalacia.    Posterior Fossa, vertebral arteries, paraspinal tissues: Posterior fossa demonstrates no focal abnormality. Vertebral artery flow voids are maintained. Paraspinal soft tissues are unremarkable.   Disc levels:   Discs: Anterior cervical fusion from C3 through C6. Degenerative disease with disc height loss at C6-7.   C2-3: No significant disc bulge. No neural foraminal stenosis. No central canal stenosis.   C3-4: Interbody fusion. Mild right foraminal stenosis. No left foraminal stenosis. No spinal stenosis.   C4-5: Interbody fusion. Mild bilateral foraminal stenosis. No spinal stenosis.   C5-6: Interbody fusion.  No foraminal or central canal stenosis.   C6-7: Mild broad-based disc bulge. Bilateral uncovertebral degenerative changes. Severe left and moderate right foraminal stenosis. Mild spinal stenosis.   C7-T1: Mild broad-based disc bulge. Mild bilateral facet arthropathy. Mild left foraminal stenosis. No right foraminal stenosis. No spinal stenosis.   Mild broad-based disc bulge. No foraminal or central canal stenosis. No spinal stenosis.   IMPRESSION: 1. Anterior cervical fusion from C3 through C6. Mild right foraminal stenosis at C3-4. Mild bilateral foraminal stenosis at C4-5. 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 2. At C6-7 there is a mild broad-based disc bulge. Bilateral uncovertebral degenerative changes. Severe left and moderate right foraminal stenosis. Mild spinal stenosis. 3. At C7-T1 there is a mild broad-based disc bulge. Mild bilateral facet arthropathy. Mild left foraminal stenosis.     Electronically Signed   By: Elige Ko M.D.   On: 01/12/2023 13:52     Objective:  VS:  HT:    WT:   BMI:     BP:130/73  HR:85bpm  TEMP: ( )  RESP:  Physical Exam Vitals and nursing note reviewed.  Constitutional:      General: She is not in acute distress.    Appearance: Normal appearance. She is not  ill-appearing.  HENT:     Head: Normocephalic and atraumatic.     Right Ear: External ear normal.     Left Ear: External ear normal.  Eyes:     Extraocular Movements: Extraocular movements intact.  Cardiovascular:     Rate and Rhythm: Normal rate.     Pulses: Normal pulses.  Musculoskeletal:     Cervical back: Tenderness present. No rigidity.     Right lower leg: No edema.     Left lower leg: No edema.     Comments: Patient has good strength in the upper extremities including 5 out of 5 strength in wrist extension long finger flexion and APB.  There is no atrophy of the hands intrinsically.  There is a negative Hoffmann's test.   Lymphadenopathy:     Cervical: No cervical adenopathy.  Skin:    Findings: No erythema, lesion or rash.  Neurological:     General: No focal deficit present.     Mental Status: She is alert and oriented to person, place, and time.     Sensory: No sensory deficit.     Motor: No weakness or abnormal muscle tone.     Coordination: Coordination normal.  Psychiatric:        Mood and Affect: Mood normal.        Behavior: Behavior normal.      Imaging: No results found.

## 2023-02-07 NOTE — Procedures (Signed)
Cervical Epidural Steroid Injection - Interlaminar Approach with Fluoroscopic Guidance  Patient: Jenny [name]      Date of Birth: April 23, 1941 MRN: [id number] PCP: Sheliah Hatch, MD      Visit Date: 01/28/2023   Universal Protocol:    Date/Time: 02/06/2409:53 AM  Consent Given By: the patient  Position: PRONE  Additional Comments: Vital signs were monitored before and after the procedure. Patient was prepped and draped in the usual sterile fashion. The correct patient, procedure, and site was verified.   Injection Procedure Details:   Procedure diagnoses: Cervical radiculopathy [M54.12]    Meds Administered:  Meds ordered this encounter  Medications   methylPREDNISolone acetate (DEPO-MEDROL) injection 80 mg     Laterality: Left  Location/Site: C7-T1  Needle: 3.5 in., 20 ga. Tuohy  Needle Placement: Paramedian epidural space  Findings:  -Comments: Excellent flow of contrast into the epidural space.  Procedure Details: Using a paramedian approach from the side mentioned above, the region overlying the inferior lamina was localized under fluoroscopic visualization and the soft tissues overlying this structure were infiltrated with 4 ml. of 1% Lidocaine without Epinephrine. A # 20 gauge, Tuohy needle was inserted into the epidural space using a paramedian approach.  The epidural space was localized using loss of resistance along with contralateral oblique bi-planar fluoroscopic views.  After negative aspirate for air, blood, and CSF, a 2 ml. volume of Isovue-250 was injected into the epidural space and the flow of contrast was observed. Radiographs were obtained for documentation purposes.   The injectate was administered into the level noted above.  Additional Comments:  The patient tolerated the procedure well Dressing: 2 x 2 sterile gauze and Band-Aid    Post-procedure details: Patient was observed during the procedure. Post-procedure instructions were  reviewed.  Patient left the clinic in stable condition.

## 2023-02-16 ENCOUNTER — Other Ambulatory Visit: Payer: Self-pay | Admitting: Family Medicine

## 2023-02-16 DIAGNOSIS — F418 Other specified anxiety disorders: Secondary | ICD-10-CM

## 2023-02-26 ENCOUNTER — Telehealth: Payer: Self-pay | Admitting: Family Medicine

## 2023-02-26 NOTE — Telephone Encounter (Signed)
Placed in Dr Tabori to be reviewed folder 

## 2023-02-26 NOTE — Telephone Encounter (Signed)
Placed in front bin.   Caller name: [employer] [facility]  Call back number: [phone]  Provider they see: Sheliah Hatch, MD  Reason for call:Visit Summary

## 2023-03-03 ENCOUNTER — Encounter: Payer: Self-pay | Admitting: Family Medicine

## 2023-03-04 ENCOUNTER — Encounter: Payer: Self-pay | Admitting: Family Medicine

## 2023-03-04 ENCOUNTER — Other Ambulatory Visit (HOSPITAL_COMMUNITY): Payer: Self-pay

## 2023-03-04 ENCOUNTER — Telehealth: Payer: Self-pay

## 2023-03-04 ENCOUNTER — Ambulatory Visit (INDEPENDENT_AMBULATORY_CARE_PROVIDER_SITE_OTHER): Payer: Medicare Other | Admitting: Family Medicine

## 2023-03-04 ENCOUNTER — Other Ambulatory Visit: Payer: Self-pay

## 2023-03-04 VITALS — BP 122/76 | HR 81 | Temp 97.9°F | Resp 19 | Ht 64.0 in | Wt 209.5 lb

## 2023-03-04 DIAGNOSIS — R5383 Other fatigue: Secondary | ICD-10-CM | POA: Diagnosis not present

## 2023-03-04 DIAGNOSIS — I10 Essential (primary) hypertension: Secondary | ICD-10-CM | POA: Diagnosis not present

## 2023-03-04 DIAGNOSIS — E039 Hypothyroidism, unspecified: Secondary | ICD-10-CM | POA: Diagnosis not present

## 2023-03-04 DIAGNOSIS — E559 Vitamin D deficiency, unspecified: Secondary | ICD-10-CM

## 2023-03-04 LAB — BASIC METABOLIC PANEL
BUN: 21 mg/dL (ref 6–23)
CO2: 31 mEq/L (ref 19–32)
Calcium: 9.2 mg/dL (ref 8.4–10.5)
Chloride: 102 mEq/L (ref 96–112)
Creatinine, Ser: 0.95 mg/dL (ref 0.40–1.20)
GFR: 55.1 mL/min — ABNORMAL LOW (ref >60.00)
Glucose, Bld: 101 mg/dL — ABNORMAL HIGH (ref 70–99)
Potassium: 4.7 mEq/L (ref 3.5–5.1)
Sodium: 139 mEq/L (ref 135–145)

## 2023-03-04 LAB — CBC WITH DIFFERENTIAL/PLATELET
Basophils Absolute: 0.1 10*3/uL (ref 0.0–0.1)
Basophils Relative: 1.1 % (ref 0.0–3.0)
Eosinophils Absolute: 0.6 10*3/uL (ref 0.0–0.7)
Eosinophils Relative: 9.1 % — ABNORMAL HIGH (ref 0.0–5.0)
HCT: 43 % (ref 36.0–46.0)
Hemoglobin: 14.1 g/dL (ref 12.0–15.0)
Lymphocytes Relative: 33.9 % (ref 12.0–46.0)
Lymphs Abs: 2.2 10*3/uL (ref 0.7–4.0)
MCHC: 32.8 g/dL (ref 30.0–36.0)
MCV: 91.8 fl (ref 78.0–100.0)
Monocytes Absolute: 0.6 10*3/uL (ref 0.1–1.0)
Monocytes Relative: 9.6 % (ref 3.0–12.0)
Neutro Abs: 3.1 10*3/uL (ref 1.4–7.7)
Neutrophils Relative %: 46.3 % (ref 43.0–77.0)
Platelets: 330 10*3/uL (ref 150.0–400.0)
RBC: 4.69 Mil/uL (ref 3.87–5.11)
RDW: 13.7 % (ref 11.5–15.5)
WBC: 6.6 10*3/uL (ref 4.0–10.5)

## 2023-03-04 LAB — HEPATIC FUNCTION PANEL
ALT: 11 U/L (ref 0–35)
AST: 17 U/L (ref 0–37)
Albumin: 3.6 g/dL (ref 3.5–5.2)
Alkaline Phosphatase: 81 U/L (ref 39–117)
Bilirubin, Direct: 0.1 mg/dL (ref 0.0–0.3)
Total Bilirubin: 0.4 mg/dL (ref 0.2–1.2)
Total Protein: 6.4 g/dL (ref 6.0–8.3)

## 2023-03-04 LAB — LIPID PANEL
Cholesterol: 136 mg/dL (ref 0–200)
HDL: 35 mg/dL — ABNORMAL LOW (ref >39.00)
LDL Cholesterol: 74 mg/dL (ref 0–99)
NonHDL: 100.57
Total CHOL/HDL Ratio: 4
Triglycerides: 133 mg/dL (ref 0.0–149.0)
VLDL: 26.6 mg/dL (ref 0.0–40.0)

## 2023-03-04 LAB — TSH: TSH: 1.69 u[IU]/mL (ref 0.35–5.50)

## 2023-03-04 LAB — B12 AND FOLATE PANEL
Folate: 18.2 ng/mL (ref >5.9)
Vitamin B-12: 131 pg/mL — ABNORMAL LOW (ref 211–911)

## 2023-03-04 LAB — VITAMIN D 25 HYDROXY (VIT D DEFICIENCY, FRACTURES): VITD: 27.63 ng/mL — ABNORMAL LOW (ref 30.00–100.00)

## 2023-03-04 LAB — HEMOGLOBIN A1C: Hgb A1c MFr Bld: 5.9 % (ref 4.6–6.5)

## 2023-03-04 MED ORDER — ESCITALOPRAM OXALATE 20 MG PO TABS
20.0000 mg | ORAL_TABLET | Freq: Every day | ORAL | 1 refills | Status: DC
Start: 2023-03-04 — End: 2023-10-20

## 2023-03-04 MED ORDER — FUROSEMIDE 20 MG PO TABS
20.0000 mg | ORAL_TABLET | Freq: Every day | ORAL | 1 refills | Status: DC
Start: 2023-03-04 — End: 2023-05-12

## 2023-03-04 MED ORDER — VITAMIN D (ERGOCALCIFEROL) 1.25 MG (50000 UNIT) PO CAPS
50000.0000 [IU] | ORAL_CAPSULE | ORAL | 12 refills | Status: DC
Start: 2023-03-04 — End: 2023-08-23

## 2023-03-04 NOTE — Patient Instructions (Signed)
Follow up in 6 weeks to recheck mood and fatigue We'll notify you of your lab results and make any changes if needed INCREASE the Lexapro to 20mg  daily- 2 of what you have at home and 1 of the new prescription Start the Furosemide (Lasix) as needed for swelling Restart your CPAP at night and see if this helps your fatigue Call with any questions or concerns Hang in there!

## 2023-03-04 NOTE — Progress Notes (Signed)
   Subjective:    Patient ID: Jenny [name], female    DOB: [birth year], 82 y.o.   MRN: [id number]  HPI Hypothyroid- chronic problem.  Pt currently on daily.  Pt reports ongoing fatigue.  'i just don't have the energy to do what I want to do or need to do'  HTN- chronic problem.  On Amlodipine 5mg  daily w/ good control.  Some leg swelling (L>R).  No CP, SOB above baseline, HA's, visual changes.  Obesity- weight is stable at 209, BMI 35.96  no energy due to excessive fatigue  Fatigue- pt reports her fatigue is excessive.  Will go to bed at 6pm and sleep til 9.  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  Also suffers from depression- on Lexapro 10mg  daily.  'i stay depressed'.  Review of Systems For ROS see HPI     Objective:   Physical Exam Vitals reviewed.  Constitutional:      General: She is not in acute distress.    Appearance: Normal appearance. She is well-developed. She is obese. She is not ill-appearing.  HENT:     Head: Normocephalic and atraumatic.  Eyes:     Conjunctiva/sclera: Conjunctivae normal.     Pupils: Pupils are equal, round, and reactive to light.  Neck:     Thyroid: No thyromegaly.  Cardiovascular:     Rate and Rhythm: Normal rate and regular rhythm.     Heart sounds: Normal heart sounds. No murmur heard. Pulmonary:     Effort: Pulmonary effort is normal. No respiratory distress.     Breath sounds: Wheezing (diffuse end expiratory wheezes) present.  Abdominal:     General: There is no distension.     Palpations: Abdomen is soft.     Tenderness: There is no abdominal tenderness.  Musculoskeletal:     Cervical back: Normal range of motion and neck supple.     Right lower leg: Edema (trace) present.     Left lower leg: Edema (trace) present.  Lymphadenopathy:     Cervical: No cervical adenopathy.  Skin:    General: Skin is warm and dry.  Neurological:     General: No focal deficit present.     Mental Status: She is alert and oriented to person, place, and  time.  Psychiatric:        Behavior: Behavior normal.          Assessment & Plan:  Fatigue- recurrent issue for pt.  Will check labs to r/o metabolic causes of fatigue but also discussed emotional fatigue and depression.  Pt admits she has 'lots of reasons to be depressed'.  Will increase Lexapro to 20mg  daily and monitor for improvement.  Pt expressed understanding and is in agreement w/ plan.

## 2023-03-04 NOTE — Assessment & Plan Note (Signed)
Chronic problem.  May be contributing to fatigue.  Check labs.  Adjust meds prn. 

## 2023-03-04 NOTE — Assessment & Plan Note (Signed)
Ongoing issue.  Weight and BMI are stable.  She is not exercising or getting physical activity due to fatigue.  Check labs to risk stratify.  Will follow.

## 2023-03-04 NOTE — Assessment & Plan Note (Signed)
Chronic problem.  Currently well controlled on Amlodipine 5mg  daily.  No changes at this time.

## 2023-03-04 NOTE — Telephone Encounter (Signed)
Pt aware of lab results . Vit d 50,000 units has been sent in and pt is coming in for her B 12 injection tomorrow 03/05/23

## 2023-03-04 NOTE — Telephone Encounter (Signed)
We changed this pt Lexapro 20 mg this exceeds the limit for pt age per insurance . The 10 mg was not helping please obtain a PA for the 20 mg ? Dx Depression and Anxiety

## 2023-03-04 NOTE — Telephone Encounter (Signed)
-----   Message from Sheliah Hatch, MD sent at 03/04/2023  3:33 PM EDT ----- Both B12 and Vit D are low.  I recommend doing weekly B12 injections x4 weeks and then monthly x6 months.  For Vit D, we need to start 50,000 units weekly x12 weeks in addition to daily OTC supplement of at least 2000 units.   Remainder of labs look great!

## 2023-03-05 ENCOUNTER — Other Ambulatory Visit (HOSPITAL_COMMUNITY): Payer: Self-pay

## 2023-03-05 ENCOUNTER — Ambulatory Visit (INDEPENDENT_AMBULATORY_CARE_PROVIDER_SITE_OTHER): Payer: Medicare Other

## 2023-03-05 DIAGNOSIS — E538 Deficiency of other specified B group vitamins: Secondary | ICD-10-CM | POA: Diagnosis not present

## 2023-03-05 MED ORDER — CYANOCOBALAMIN 1000 MCG/ML IJ SOLN
1000.0000 ug | Freq: Once | INTRAMUSCULAR | Status: AC
Start: 2023-03-05 — End: 2023-03-05
  Administered 2023-03-05: 1000 ug via INTRAMUSCULAR

## 2023-03-05 NOTE — Telephone Encounter (Signed)
Ran test claim, came back refill too soon. Next fill 05/11/23

## 2023-03-05 NOTE — Progress Notes (Signed)
Pt came in for her fist B 12 injection pt tolerated well  Gave 1 mL of B 12 in left deltoid  Advised to return next week for her next injection

## 2023-03-05 NOTE — Telephone Encounter (Signed)
I have informed pt of the findings . I did explain to her that she can go to [facility] and use the [facility] card and the cost would be $ 22 . She states she will let me know what she would like to do

## 2023-03-11 ENCOUNTER — Ambulatory Visit (INDEPENDENT_AMBULATORY_CARE_PROVIDER_SITE_OTHER): Payer: Medicare Other

## 2023-03-11 DIAGNOSIS — E538 Deficiency of other specified B group vitamins: Secondary | ICD-10-CM | POA: Diagnosis not present

## 2023-03-11 MED ORDER — CYANOCOBALAMIN 1000 MCG/ML IJ SOLN
1000.0000 ug | INTRAMUSCULAR | Status: AC
Start: 2023-03-11 — End: 2023-04-08
  Administered 2023-03-11 – 2023-04-08 (×4): 1000 ug via INTRAMUSCULAR

## 2023-03-11 NOTE — Progress Notes (Signed)
Patient presented today for her second B12 injection and was administered without issue.

## 2023-03-18 ENCOUNTER — Ambulatory Visit (INDEPENDENT_AMBULATORY_CARE_PROVIDER_SITE_OTHER): Payer: Medicare Other

## 2023-03-18 DIAGNOSIS — E538 Deficiency of other specified B group vitamins: Secondary | ICD-10-CM | POA: Diagnosis not present

## 2023-03-18 NOTE — Progress Notes (Signed)
Patient presneted for B12 injection no concerns expressed

## 2023-03-25 ENCOUNTER — Ambulatory Visit (INDEPENDENT_AMBULATORY_CARE_PROVIDER_SITE_OTHER): Payer: Medicare Other

## 2023-03-25 DIAGNOSIS — E538 Deficiency of other specified B group vitamins: Secondary | ICD-10-CM | POA: Diagnosis not present

## 2023-03-25 NOTE — Progress Notes (Signed)
Patient presented for B12 injection no concerns or questions at this time.

## 2023-03-29 ENCOUNTER — Encounter: Payer: Self-pay | Admitting: Orthopedic Surgery

## 2023-03-29 DIAGNOSIS — M79602 Pain in left arm: Secondary | ICD-10-CM

## 2023-03-31 NOTE — Telephone Encounter (Signed)
I think I would do mri arthrogram of the shoulder unless she had significant relief of her shoulder pain from c-spine ESI by Dr Alvester Morin in april

## 2023-04-01 ENCOUNTER — Encounter: Payer: Self-pay | Admitting: Orthopedic Surgery

## 2023-04-01 NOTE — Telephone Encounter (Signed)
I ordered a MRI of the shoulder. Anything you could recommend for pain?

## 2023-04-02 ENCOUNTER — Other Ambulatory Visit: Payer: Self-pay | Admitting: Orthopedic Surgery

## 2023-04-02 MED ORDER — HYDROCODONE-ACETAMINOPHEN 5-325 MG PO TABS: 1.0000 | ORAL_TABLET | Freq: Two times a day (BID) | ORAL | 0 refills | Status: DC | PRN

## 2023-04-02 NOTE — Telephone Encounter (Signed)
She is allergic to codeine.  Can you please call her and find out what she can take for pain which has helped her.  She is allergic to tramadol and allergic to codeine.  Thanks

## 2023-04-08 ENCOUNTER — Encounter: Payer: Self-pay | Admitting: Family Medicine

## 2023-04-08 ENCOUNTER — Ambulatory Visit (INDEPENDENT_AMBULATORY_CARE_PROVIDER_SITE_OTHER): Payer: Medicare Other

## 2023-04-08 ENCOUNTER — Ambulatory Visit (INDEPENDENT_AMBULATORY_CARE_PROVIDER_SITE_OTHER): Payer: Medicare Other | Admitting: Family Medicine

## 2023-04-08 VITALS — BP 112/68 | HR 79 | Temp 98.7°F | Ht 64.0 in | Wt 210.0 lb

## 2023-04-08 DIAGNOSIS — H9202 Otalgia, left ear: Secondary | ICD-10-CM

## 2023-04-08 DIAGNOSIS — E538 Deficiency of other specified B group vitamins: Secondary | ICD-10-CM

## 2023-04-08 NOTE — Patient Instructions (Signed)
-  Based on physical exam, no signs of infection.  -Suspect pain is coming from shoulder.  -Follow up if pain persist or the shoulder MRI does not show a reason that you could be having nerve pain.

## 2023-04-08 NOTE — Progress Notes (Signed)
   Acute Office Visit   Subjective:  Patient ID: Jenny [name], female    DOB: 14-Mar-1941, 82 y.o.   MRN: [id number]  Chief Complaint  Patient presents with   Ear Pain    Pt is here today with C/O earache left ear Describes as sharp and stabbing Sx 3 days    HPI Patient is a 82 year old caucasian female that present with intermittent left ear pain. Described as sharp and stabbing. Usually last 1-2 minutes, usually occurs about twice a day. Symptoms started 3 days ago.   Denies any drainage, fever, or upper respiratory symptoms.   Patient is concerned. She is having an MRI with contrast on Monday for her left shoulder. Patient reports she has neck also and behind the left ear will hurt some.   ROS See HPI above      Objective:   BP 112/68   Pulse 79   Temp 98.7 F (37.1 C)   Ht 5\' 4"  (1.626 m)   Wt 210 lb (95.3 kg)   SpO2 98%   BMI 36.05 kg/m    Physical Exam Vitals reviewed.  Constitutional:      General: She is not in acute distress.    Appearance: Normal appearance. She is not ill-appearing, toxic-appearing or diaphoretic.  HENT:     Head: Normocephalic and atraumatic.     Left Ear: Tympanic membrane, ear canal and external ear normal.     Ears:     Comments: Right ear canal has mild amount of cerumen.  Eyes:     General:        Right eye: No discharge.        Left eye: No discharge.     Conjunctiva/sclera: Conjunctivae normal.  Cardiovascular:     Rate and Rhythm: Normal rate.  Pulmonary:     Effort: Pulmonary effort is normal. No respiratory distress.  Musculoskeletal:        General: Normal range of motion.  Lymphadenopathy:     Head:     Left side of head: No submental, submandibular, preauricular or posterior auricular adenopathy.  Skin:    General: Skin is warm and dry.  Neurological:     General: No focal deficit present.     Mental Status: She is alert and oriented to person, place, and time. Mental status is at baseline.  Psychiatric:         Mood and Affect: Mood normal.        Behavior: Behavior normal.        Thought Content: Thought content normal.        Judgment: Judgment normal.       Assessment & Plan:  Left ear pain  -Based on physical exam, no signs of infection.  -Suspect pain is coming from shoulder.  -Follow up if pain persist or the shoulder MRI does not show a reason that you could be having nerve pain.   Zandra Abts, NP

## 2023-04-08 NOTE — Progress Notes (Signed)
Patient presented today for B-12 injection notes she is now on BiWeekly schedule and has made an appointment for the next injection. Patient complained of pain in her ear and I did advise out NP Zandra Abts had openings today if she would like to be seen and she did decide to schedule an appointment for after injection at 8:40

## 2023-04-12 ENCOUNTER — Ambulatory Visit
Admission: RE | Admit: 2023-04-12 | Discharge: 2023-04-12 | Disposition: A | Discharge status: Home / Self Care | Payer: Medicare Other | Source: Ambulatory Visit | Attending: Surgical | Admitting: Surgical

## 2023-04-12 DIAGNOSIS — M25512 Pain in left shoulder: Secondary | ICD-10-CM | POA: Diagnosis not present

## 2023-04-12 DIAGNOSIS — M79602 Pain in left arm: Secondary | ICD-10-CM

## 2023-04-12 DIAGNOSIS — M75122 Complete rotator cuff tear or rupture of left shoulder, not specified as traumatic: Secondary | ICD-10-CM | POA: Diagnosis not present

## 2023-04-12 DIAGNOSIS — M67814 Other specified disorders of tendon, left shoulder: Secondary | ICD-10-CM | POA: Diagnosis not present

## 2023-04-12 MED ORDER — IOPAMIDOL (ISOVUE-M 200) INJECTION 41%: 12.0000 mL | Freq: Once | INTRAMUSCULAR | Status: DC

## 2023-04-13 ENCOUNTER — Ambulatory Visit: Payer: Medicare Other | Admitting: Podiatry

## 2023-04-13 ENCOUNTER — Other Ambulatory Visit: Payer: Self-pay | Admitting: Family Medicine

## 2023-04-13 NOTE — Telephone Encounter (Signed)
Please submit.

## 2023-04-14 ENCOUNTER — Other Ambulatory Visit: Payer: Self-pay | Admitting: Family Medicine

## 2023-04-14 NOTE — Telephone Encounter (Signed)
Duplicate

## 2023-04-16 NOTE — Telephone Encounter (Signed)
VOB submitted for Durolane, left knee.  

## 2023-04-19 ENCOUNTER — Ambulatory Visit: Payer: Medicare Other | Admitting: Surgical

## 2023-04-19 ENCOUNTER — Other Ambulatory Visit: Payer: Self-pay

## 2023-04-19 ENCOUNTER — Ambulatory Visit: Payer: Medicare Other | Admitting: Family Medicine

## 2023-04-19 DIAGNOSIS — M75102 Unspecified rotator cuff tear or rupture of left shoulder, not specified as traumatic: Secondary | ICD-10-CM | POA: Diagnosis not present

## 2023-04-19 DIAGNOSIS — M1712 Unilateral primary osteoarthritis, left knee: Secondary | ICD-10-CM

## 2023-04-22 ENCOUNTER — Ambulatory Visit: Payer: Medicare Other

## 2023-04-22 ENCOUNTER — Encounter: Payer: Self-pay | Admitting: Family Medicine

## 2023-04-22 ENCOUNTER — Ambulatory Visit (INDEPENDENT_AMBULATORY_CARE_PROVIDER_SITE_OTHER): Payer: Medicare Other | Admitting: Family Medicine

## 2023-04-22 VITALS — BP 122/78 | HR 70 | Temp 98.9°F | Resp 17 | Ht 64.0 in | Wt 206.2 lb

## 2023-04-22 DIAGNOSIS — F418 Other specified anxiety disorders: Secondary | ICD-10-CM

## 2023-04-22 DIAGNOSIS — E538 Deficiency of other specified B group vitamins: Secondary | ICD-10-CM

## 2023-04-22 MED ORDER — CYANOCOBALAMIN 1000 MCG/ML IJ SOLN
1000.0000 ug | Freq: Once | INTRAMUSCULAR | Status: AC
Start: 2023-04-22 — End: 2023-04-22
  Administered 2023-04-22: 1000 ug via INTRAMUSCULAR

## 2023-04-22 MED ORDER — ALPRAZOLAM 0.5 MG PO TABS: ORAL_TABLET | ORAL | 0 refills | Status: DC

## 2023-04-22 NOTE — Progress Notes (Signed)
   Subjective:    Patient ID: Jenny [name], female    DOB: [birth year], 82 y.o.   MRN: [id number]  HPI Depression/Anxiety- ongoing issue.  At last visit we increased Lexapro to 20mg  daily.  Also on Alprazolam 0.5mg  PRN.  Pt reports feeling better.  Energy level has improved.  Thinking is more clear.    B12 deficiency- pt due for repeat injxn.  Thinks energy is better w/ B12 injxns.     Review of Systems For ROS see HPI     Objective:   Physical Exam Vitals reviewed.  Constitutional:      General: She is not in acute distress.    Appearance: Normal appearance. She is not ill-appearing.  HENT:     Head: Normocephalic and atraumatic.  Skin:    General: Skin is warm and dry.  Neurological:     General: No focal deficit present.     Mental Status: She is alert and oriented to person, place, and time.  Psychiatric:        Mood and Affect: Mood normal.        Behavior: Behavior normal.        Thought Content: Thought content normal.           Assessment & Plan:

## 2023-04-22 NOTE — Assessment & Plan Note (Signed)
Improving w/ increased dose of Lexapro.  Will continue 20mg  daily and follow.

## 2023-04-22 NOTE — Assessment & Plan Note (Signed)
Pt feels that B12 is improving her energy level.  Repeat injection given today.

## 2023-04-22 NOTE — Patient Instructions (Signed)
Schedule your complete physical in 3-4 months No med changes at this time Keep up the good work- you look great!!! Call with any questions or concerns Stay Safe!  Stay Healthy! Have a great summer!!

## 2023-04-23 ENCOUNTER — Telehealth: Payer: Self-pay | Admitting: Family Medicine

## 2023-04-23 ENCOUNTER — Encounter: Payer: Self-pay | Admitting: Surgical

## 2023-04-23 MED ORDER — LIDOCAINE HCL 1 % IJ SOLN
5.0000 mL | INTRAMUSCULAR | Status: AC | PRN
Start: 2023-04-19 — End: 2023-04-19
  Administered 2023-04-19: 5 mL

## 2023-04-23 MED ORDER — BUPIVACAINE HCL 0.5 % IJ SOLN
9.0000 mL | INTRAMUSCULAR | Status: AC | PRN
Start: 2023-04-19 — End: 2023-04-19
  Administered 2023-04-19: 9 mL via INTRA_ARTICULAR

## 2023-04-23 MED ORDER — METHYLPREDNISOLONE ACETATE 40 MG/ML IJ SUSP
40.0000 mg | INTRAMUSCULAR | Status: AC | PRN
Start: 2023-04-19 — End: 2023-04-19
  Administered 2023-04-19: 40 mg via INTRA_ARTICULAR

## 2023-04-23 MED ORDER — SODIUM HYALURONATE 60 MG/3ML IX PRSY
60.0000 mg | PREFILLED_SYRINGE | INTRA_ARTICULAR | Status: AC | PRN
Start: 2023-04-19 — End: 2023-04-19
  Administered 2023-04-19: 60 mg via INTRA_ARTICULAR

## 2023-04-23 MED ORDER — ALPRAZOLAM 0.5 MG PO TABS: ORAL_TABLET | ORAL | 0 refills | Status: DC

## 2023-04-23 NOTE — Telephone Encounter (Signed)
Prescription sent to pharmacy.

## 2023-04-23 NOTE — Telephone Encounter (Signed)
Patient informed. 

## 2023-04-23 NOTE — Telephone Encounter (Signed)
Encourage patient to contact the pharmacy for refills or they can request refills through [facility]  WHAT PHARMACY WOULD THEY LIKE THIS SENT TO:  [id number] - [place], [place] - [address]  MEDICATION NAME & DOSE: ALPRAZolam ALPRAZolam (XANAX) 0.5 MG tablet  NOTES/COMMENTS FROM PATIENT: **Sent to wrong pharmacy      Front office please notify patient: It takes 48-72 hours to process rx refill requests Ask patient to call pharmacy to ensure rx is ready before heading there.

## 2023-04-23 NOTE — Progress Notes (Signed)
Office Visit Note   Patient: Jenny [name]           Date of Birth: 03/11/1941           MRN: [id number] Visit Date: 04/19/2023 Requested by: Sheliah Hatch, MD [address] [place],  [place] [address] PCP: Sheliah Hatch, MD  Subjective: Chief Complaint  Patient presents with  . Left Knee - Pain    HPI: Jenny [name] is a 82 y.o. female who presents to the office for MRI review. Patient denies any changes in symptoms.  Continues to complain mainly of left shoulder pain.  Localizes pain to the mid humeral region.  She has had prior cervical spine injection with Dr. Alvester Morin that provided good relief of the radicular pain but no relief of the focal left shoulder pain.  She also has had prior intra-articular left shoulder injection that really did not provide much relief for her left shoulder pain according to her but looking in the notes on epic it looks like it did provide some temporary relief for her.  She notes difficulty with pulling the blanket over her at night she is in certain positions.  She is able to lift her arm overhead with minimal discomfort but lifting objects out away from her and overhead does cause some pain.  MRI results revealed: MR Shoulder Left w/ contrast  Result Date: 04/19/2023 CLINICAL DATA:  Left shoulder pain EXAM: MRI OF THE LEFT SHOULDER WITH CONTRAST TECHNIQUE: Multiplanar, multisequence MR imaging of the left shoulder was performed following the administration of intra-articular contrast. CONTRAST:  See Injection Documentation. COMPARISON:  None Available. FINDINGS: Rotator cuff: Large full-thickness, near complete, tear of the supraspinatus tendon with a few intact posterior fibers and 12.6 mm of retraction. Mild tendinosis of the infraspinatus tendon. Teres minor tendon is intact. Partial-thickness tear of the subscapularis tendon. Muscles: No muscle atrophy or edema. No intramuscular fluid collection or hematoma. Biceps Long Head: Mild  tendinosis of the intra-articular portion of the long head of the biceps tendon. Acromioclavicular Joint: Mild arthropathy of the acromioclavicular joint. Small amount contrast in the subacromial/subdeltoid bursa. Glenohumeral Joint: Intraarticular contrast distending the joint capsule. Normal glenohumeral ligaments. No chondral defect. Labrum: Intact. Bones: No fracture or dislocation. No aggressive osseous lesion. Mild subcortical reactive marrow changes of the infraspinatus insertion. Other: No fluid collection or hematoma. IMPRESSION: 1. Large full-thickness, near complete, tear of the supraspinatus tendon with a few intact posterior fibers and 12.6 mm of retraction. 2. Mild tendinosis of the infraspinatus tendon. 3. Partial-thickness tear of the subscapularis tendon. 4. Mild tendinosis of the intra-articular portion of the long head of the biceps tendon. Electronically Signed   By: Elige Ko M.D.   On: 04/19/2023 10:09                 ROS: All systems reviewed are negative as they relate to the chief complaint within the history of present illness.  Patient denies fevers or chills.  Assessment & Plan: Visit Diagnoses:  1. Tear of left supraspinatus tendon   2. Unilateral primary osteoarthritis, left knee     Plan: Jenny [name] is a 82 y.o. female who presents to the office to review left shoulder MRI scan.  MRI demonstrates large full-thickness near complete tear of the supraspinatus with some fibers intact posteriorly.  She does have decent compensation for this full-thickness tear and she has very little difficulty getting her arm overhead.  Does have some weakness  with lifting things away from her body and with pulling the blanket over her at night.  We did discuss options available to patient.  She wants to avoid surgery and do not think she is the greatest surgical candidate for rotator cuff tear repair anyway.  She had some relief with intra-articular injection was done by Dr. August Saucer  based on chart review.  We will try subacromial injection today to see if that will provide more relief of her symptoms given most of her symptoms are localizing to the mid humeral region.  This was administered and patient tolerated procedure well without complication.  There is no resistance during administration of the injection.  Follow-up with the office as needed if symptoms do not improve.  Patient also had left knee Durolane injection administered today.  Patient tolerated procedure well.  Follow-Up Instructions: No follow-ups on file.   Orders:  No orders of the defined types were placed in this encounter.  No orders of the defined types were placed in this encounter.     Procedures: Large Joint Inj: L subacromial bursa on 04/19/2023 11:00 AM Indications: diagnostic evaluation and pain Details: 18 G 1.5 in needle, posterior approach  Arthrogram: No  Medications: 9 mL bupivacaine 0.5 %; 40 mg methylPREDNISolone acetate 40 MG/ML; 5 mL lidocaine 1 % Outcome: tolerated well, no immediate complications Procedure, treatment alternatives, risks and benefits explained, specific risks discussed. Consent was given by the patient. Immediately prior to procedure a time out was called to verify the correct patient, procedure, equipment, support staff and site/side marked as required. Patient was prepped and draped in the usual sterile fashion.    Large Joint Inj: L knee on 04/19/2023 11:00 AM Indications: pain, joint swelling and diagnostic evaluation Details: 18 G 1.5 in needle, superolateral approach  Arthrogram: No  Medications: 5 mL lidocaine 1 %; 60 mg Sodium Hyaluronate 60 MG/3ML Outcome: tolerated well, no immediate complications Procedure, treatment alternatives, risks and benefits explained, specific risks discussed. Consent was given by the patient. Immediately prior to procedure a time out was called to verify the correct patient, procedure, equipment, support staff and site/side  marked as required. Patient was prepped and draped in the usual sterile fashion.     Clinical Data: No additional findings.  Objective: Vital Signs: There were no vitals taken for this visit.  Physical Exam:  Constitutional: Patient appears well-developed HEENT:  Head: Normocephalic Eyes:EOM are normal Neck: Normal range of motion Cardiovascular: Normal rate Pulmonary/chest: Effort normal Neurologic: Patient is alert Skin: Skin is warm Psychiatric: Patient has normal mood and affect  Ortho Exam: Ortho exam demonstrates left shoulder with 45 degrees external rotation, 90 degrees abduction, 150 degrees forward elevation.  This is both passively and actively.  She has good strength with empty can testing.  Good strength of supraspinatus and infraspinatus rated 5 -/5.  Negative external rotation lag sign.  Negative Hornblower sign.  5/5 subscapularis strength.  There is some crepitus noted with passive motion of the left shoulder.  Axillary nerve intact with deltoid firing.  2+ radial pulse of the left upper extremity.  Specialty Comments:  Narrative & Impression CLINICAL DATA:  Cervical pain for 3-4 months.   EXAM: MRI CERVICAL SPINE WITHOUT CONTRAST   TECHNIQUE: Multiplanar, multisequence MR imaging of the cervical spine was performed. No intravenous contrast was administered.   COMPARISON:  11/21/2017   FINDINGS: Alignment: Physiologic.   Vertebrae: No acute fracture, evidence of discitis, or aggressive bone lesion.   Cord: Focal area of  T2 hyperintensity in the cervical spinal cord at C4-5 with mild volume loss as can be seen with myelomalacia.   Posterior Fossa, vertebral arteries, paraspinal tissues: Posterior fossa demonstrates no focal abnormality. Vertebral artery flow voids are maintained. Paraspinal soft tissues are unremarkable.   Disc levels:   Discs: Anterior cervical fusion from C3 through C6. Degenerative disease with disc height loss at C6-7.    C2-3: No significant disc bulge. No neural foraminal stenosis. No central canal stenosis.   C3-4: Interbody fusion. Mild right foraminal stenosis. No left foraminal stenosis. No spinal stenosis.   C4-5: Interbody fusion. Mild bilateral foraminal stenosis. No spinal stenosis.   C5-6: Interbody fusion.  No foraminal or central canal stenosis.   C6-7: Mild broad-based disc bulge. Bilateral uncovertebral degenerative changes. Severe left and moderate right foraminal stenosis. Mild spinal stenosis.   C7-T1: Mild broad-based disc bulge. Mild bilateral facet arthropathy. Mild left foraminal stenosis. No right foraminal stenosis. No spinal stenosis.   Mild broad-based disc bulge. No foraminal or central canal stenosis. No spinal stenosis.   IMPRESSION: 1. Anterior cervical fusion from C3 through C6. Mild right foraminal stenosis at C3-4. Mild bilateral foraminal stenosis at C4-5. 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 2. At C6-7 there is a mild broad-based disc bulge. Bilateral uncovertebral degenerative changes. Severe left and moderate right foraminal stenosis. Mild spinal stenosis. 3. At C7-T1 there is a mild broad-based disc bulge. Mild bilateral facet arthropathy. Mild left foraminal stenosis.     Electronically Signed   By: Elige Ko M.D.   On: 01/12/2023 13:52  Imaging: No results found.   PMFS History: Patient Active Problem List   Diagnosis Date Noted  . B12 deficiency 04/22/2023  . Ventricular bigeminy 05/20/2022  . Acute asthmatic bronchitis 09/09/2021  . 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 Abnormal CT of the chest 04/18/2020  . Colon cancer screening 06/15/2016  . 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 Hyperglycemia 07/07/2015  . Annual physical exam 08/06/2013  . Atrophic vaginitis 06/17/2012  . Peripheral vascular disease,  unspecified (HCC) 06/02/2012  . Urticaria 10/15/2011  . 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  . Vitamin D deficiency 02/12/2011  . Peripheral neuropathy 02/12/2011  . Intertrigo 01/21/2011  . 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 History of chicken pox 01/14/2011  . History of measles 01/14/2011  . OTHER URINARY INCONTINENCE 03/12/2010  . Depression with anxiety 03/06/2008  . Essential hypertension 02/22/2008  . DIVERTICULOSIS OF COLON 02/22/2008  . Unilateral primary osteoarthritis, left knee 02/22/2008  . Hypothyroidism 08/23/2007  . Morbid obesity (HCC) 08/23/2007  . OSA on CPAP 08/23/2007  . Venous (peripheral) insufficiency 08/23/2007  . Allergic rhinitis 08/23/2007  . GERD without esophagitis 08/23/2007  . BACK PAIN, LUMBAR 08/23/2007   Past Medical History:  Diagnosis Date  . ALLERGIC RHINITIS 08/23/2007  . 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 Anemia 09/21/2011  . Asthma 01/14/2011  . Atrophic vaginitis 06/17/2012  . BACK PAIN, LUMBAR 08/23/2007  . DEGENERATIVE JOINT DISEASE 02/22/2008  . Depression with anxiety 03/06/2008   Qualifier: Diagnosis of  By: Kriste Basque MD, Lonzo Cloud   . DIVERTICULOSIS OF COLON 02/22/2008  . GERD (gastroesophageal reflux disease)   . HYPERTENSION 02/22/2008  . HYPOTHYROIDISM 08/23/2007  . Interstitial lung disease (HCC)   . OBESITY 08/23/2007  . Peripheral neuropathy 02/12/2011   possibly related to spondylolisthesis per pt  .  Pneumonia due to COVID-19 virus 09/2020  . PONV (postoperative nausea and vomiting)   . Sleep apnea   . VENOUS INSUFFICIENCY 08/23/2007  . VITAMIN D DEFICIENCY 08/21/2008    Family History  Problem Relation Age of Onset  . COPD Mother   . Pneumonia Mother   . Other Mother        CHF  . Hypertension Mother   . Arthritis Mother        septic knee after replacement  . Heart disease Mother        chf  . Dementia Father   . Arthritis Brother   . Asthma Daughter   .  Colon polyps Daughter   . Stroke Maternal Grandmother   . Heart disease Maternal Grandmother   . Other Maternal Grandfather        CHF  . Heart disease Maternal Grandfather        chf  . Cancer Paternal Grandmother        stomach?  . Other Paternal Grandfather        problems with kidneys  . Kidney disease Paternal Grandfather   . Anxiety disorder Daughter   . Cancer Maternal Aunt        breast  . Cancer Maternal Aunt        liver  . Colon cancer Neg Hx     Past Surgical History:  Procedure Laterality Date  . ABDOMINAL HYSTERECTOMY     took one ovary  . AP repair and sling for cystocele  8-04    Dr Patsi Sears  . APPENDECTOMY    . carpal tunel  2008  . CHOLECYSTECTOMY    . ctr     bilateral  . EYE SURGERY    . left hand  2010   left thumb, joint removal, CTR  . NECK SURGERY     x 2 - discectomy, fusion  . SHOULDER ARTHROSCOPY DISTAL CLAVICLE EXCISION AND OPEN ROTATOR CUFF REPAIR     right  . TONSILLECTOMY AND ADENOIDECTOMY    . TUBAL LIGATION     Social History   Occupational History  . Occupation: Retired  Tobacco Use  . Smoking status: Former    Current packs/day: 0.00    Average packs/day: 1.5 packs/day for 30.0 years (45.0 ttl pk-yrs)    Types: Cigarettes    Start date: 10/12/1961    Quit date: 10/13/1991    Years since quitting: 31.5  . Smokeless tobacco: Never  Vaping Use  . Vaping status: Never Used  Substance and Sexual Activity  . Alcohol use: No    Alcohol/week: 0.0 standard drinks of alcohol  . Drug use: No  . Sexual activity: Never

## 2023-04-23 NOTE — Telephone Encounter (Signed)
We sent this to [facility] and she would like it sent to [facility] [place] can we resend please ?

## 2023-05-03 ENCOUNTER — Ambulatory Visit: Payer: Medicare Other | Admitting: Podiatry

## 2023-05-03 ENCOUNTER — Encounter: Payer: Self-pay | Admitting: Podiatry

## 2023-05-03 VITALS — BP 138/78 | HR 69

## 2023-05-03 DIAGNOSIS — Q828 Other specified congenital malformations of skin: Secondary | ICD-10-CM

## 2023-05-03 DIAGNOSIS — G629 Polyneuropathy, unspecified: Secondary | ICD-10-CM | POA: Diagnosis not present

## 2023-05-03 MED ORDER — GABAPENTIN 300 MG PO CAPS
300.0000 mg | ORAL_CAPSULE | Freq: Every day | ORAL | 0 refills | Status: DC
Start: 2023-05-03 — End: 2023-09-16

## 2023-05-06 ENCOUNTER — Ambulatory Visit (INDEPENDENT_AMBULATORY_CARE_PROVIDER_SITE_OTHER): Payer: Medicare Other

## 2023-05-06 DIAGNOSIS — E538 Deficiency of other specified B group vitamins: Secondary | ICD-10-CM

## 2023-05-06 MED ORDER — CYANOCOBALAMIN 1000 MCG/ML IJ SOLN
1000.0000 ug | Freq: Once | INTRAMUSCULAR | Status: AC
Start: 2023-05-06 — End: 2023-05-06
  Administered 2023-05-06: 1000 ug via INTRAMUSCULAR

## 2023-05-06 NOTE — Progress Notes (Signed)
Patient presented today for B-12 shot, doing well notes she can feel a difference when it is getting time for her next shot notes less energy, doing well overall.

## 2023-05-09 NOTE — Progress Notes (Signed)
Subjective: Chief Complaint  Patient presents with   Callouses    "Do my calluses.  I need a refill of the Gabapentin."     82 year old female presents the office today for concerns of calluses to her left foot.  She states that they have also become painful.  No swelling redness or drainage.  No open lesions.  Asking for refill of gabapentin.  Objective: AAO x3, NAD DP/PT pulses palpable bilaterally, CRT less than 3 seconds Hyperkeratotic lesions left foot submetatarsal 1, 5 without any underlying ulceration drainage or signs of infection.  No edema, erythema.  Prominent metatarsal heads. No pain with calf compression, swelling, warmth, erythema  Assessment: Hyperkeratotic lesions left foot; neuropathy  Plan: -All treatment options discussed with the patient including all alternatives, risks, complications.  -Sharply debrided the hyperkeratotic lesions x2 without any complications or bleeding. -Refill gabapentin -Moisturizer, offloading. -Patient encouraged to call the office with any questions, concerns, change in symptoms.   Vivi Barrack DPM

## 2023-05-12 ENCOUNTER — Other Ambulatory Visit: Payer: Self-pay | Admitting: Family Medicine

## 2023-05-20 ENCOUNTER — Ambulatory Visit (INDEPENDENT_AMBULATORY_CARE_PROVIDER_SITE_OTHER): Payer: Medicare Other | Admitting: Family Medicine

## 2023-05-20 DIAGNOSIS — E538 Deficiency of other specified B group vitamins: Secondary | ICD-10-CM

## 2023-05-20 MED ORDER — CYANOCOBALAMIN 1000 MCG/ML IJ SOLN
1000.0000 ug | Freq: Once | INTRAMUSCULAR | Status: AC
Start: 2023-05-20 — End: 2023-06-17
  Administered 2023-06-17: 1000 ug via INTRAMUSCULAR

## 2023-05-20 NOTE — Progress Notes (Signed)
Pt was given B12 in left deltoid. No issues or compaints Lot [id number] Exp:10/25

## 2023-06-03 ENCOUNTER — Telehealth: Payer: Self-pay

## 2023-06-03 ENCOUNTER — Encounter: Payer: Self-pay | Admitting: Family Medicine

## 2023-06-03 ENCOUNTER — Ambulatory Visit (INDEPENDENT_AMBULATORY_CARE_PROVIDER_SITE_OTHER): Payer: Medicare Other | Admitting: Family Medicine

## 2023-06-03 DIAGNOSIS — E538 Deficiency of other specified B group vitamins: Secondary | ICD-10-CM | POA: Diagnosis not present

## 2023-06-03 MED ORDER — CYANOCOBALAMIN 1000 MCG/ML IJ SOLN
1000.0000 ug | Freq: Once | INTRAMUSCULAR | Status: AC
Start: 2023-06-03 — End: 2023-06-03
  Administered 2023-06-03: 1000 ug via INTRAMUSCULAR

## 2023-06-03 NOTE — Telephone Encounter (Signed)
See telephone note.

## 2023-06-03 NOTE — Progress Notes (Addendum)
Pt was given B12 in Right Deltoid.  Pt had no complications

## 2023-06-15 ENCOUNTER — Ambulatory Visit (HOSPITAL_BASED_OUTPATIENT_CLINIC_OR_DEPARTMENT_OTHER): Payer: Medicare Other | Admitting: Pulmonary Disease

## 2023-06-15 ENCOUNTER — Encounter (HOSPITAL_BASED_OUTPATIENT_CLINIC_OR_DEPARTMENT_OTHER): Payer: Self-pay | Admitting: Pulmonary Disease

## 2023-06-15 VITALS — BP 118/70 | HR 76 | Resp 18 | Ht 64.0 in | Wt 209.5 lb

## 2023-06-15 DIAGNOSIS — G4733 Obstructive sleep apnea (adult) (pediatric): Secondary | ICD-10-CM | POA: Diagnosis not present

## 2023-06-15 DIAGNOSIS — J4550 Severe persistent asthma, uncomplicated: Secondary | ICD-10-CM

## 2023-06-15 NOTE — Patient Instructions (Signed)
Will arrange for a new CPAP machine  Follow up in 4 months with Dr. Vassie Loll

## 2023-06-15 NOTE — Progress Notes (Signed)
[name]  Chief Complaint  Patient presents with   Follow-up    OSA on CPAP. Has good days and bad days. Has no enerrgy. B12 and Vit D are low. B12 injections weekly and now to every 2 weeks. She gets tired easily and she doesn't like it.     Past Surgical History:  She  has a past surgical history that includes Appendectomy; Tonsillectomy and adenoidectomy; AP repair and sling for cystocele (8-04 ); left hand (2010); ctr; Shoulder arthroscopy distal clavicle excision and open rotator cuff repair; Neck surgery; Abdominal hysterectomy; Cholecystectomy; carpal tunel (2008); Tubal ligation; and Eye surgery.  Past Medical History:  Vit D deficiency, Neuropathy, Low back pain, Hypothyroidism, HTN, GERD, Diverticulosis, Depression, Anxiety, DJD, COVID 19 pneumonia December 2021, Shingles  Constitutional:  BP 118/70   Pulse 76   Resp 18   Ht 5\' 4"  (1.626 m)   Wt 209 lb 8 oz (95 kg)   SpO2 97%   BMI 35.96 kg/m   Brief Summary:  Jenny [name] is a 82 y.o. 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      Subjective:   She doesn't feel like she has energy.  Was found to have low B12 and Vit D, and on supplements for these.  Still feels down since her grandson passed from drug overdose.  She has her great granddaughter staying with her, and she has cerebral palsy.  Not having cough, wheeze, or chest congestion.  She doesn't feel like her CPAP is working correctly anymore.  Physical Exam:   Appearance - well kempt   ENMT - no sinus tenderness, no oral exudate, no LAN, Mallampati 3 airway, no stridor, wears dentures  Respiratory - equal breath sounds bilaterally, no wheezing or rales  CV - s1s2 regular rate and rhythm, no murmurs  Ext - no clubbing, no edema  Skin - no rashes  Psych - normal mood and affect      Pulmonary testing:  Spirometry 04/21/18 >> FEV1 1.3 (64%), FEV1% 83 IgE 01/30/20 >> 40, Absolute eosinophils 600 PFT 04/17/20 >> FEV1 1.30 (65%), FEV1% 79, TLC 6.97 (133%), RV 5.11 (207%), DLCO 88% Serology 04/17/20 >> ANA, HP panel, anti CCP negative; RF 25  Chest Imaging:  HRCT chest 02/09/20 >> atherosclerosis, 3 mm nodule LLL, mild patchy subpleural reticulation and GGO b/l with mild basilar predominance HRCT chest 02/04/21 >> centrilobular and paraseptal emphysema; mild peripheral and basilar predominant subpleural reticulation and GGO slight increase from April 2021, minimal traction BTX (probable UIP pattern)  Sleep Tests:  PSG April 2008 >> AHI 29, SpO2 low 62% Auto CPAP 11/07/19 to 12/05/18 >> used on 22 of 30 nights with average 8 hrs 40 min.  Average AHI 1.2 with median CPAP 7 and 95 th percentile CPAP 11 cm H2O.  Cardiac Tests:  Echo 04/27/22 >> EF 65 to 70%  Social History:  She  reports that she quit smoking about 31 years ago. Her smoking use included cigarettes. She started smoking about 61 years ago. She has a 45 pack-year smoking history. She has never used smokeless tobacco. She reports that she does not drink alcohol and does not use drugs.  Family History:  Her family history includes Anxiety disorder in her daughter; Arthritis in her brother and mother; Asthma in her daughter; COPD in her mother; Cancer in her maternal aunt, maternal aunt, and paternal grandmother;  Colon polyps in her daughter; Dementia in her father; Heart disease in her maternal grandfather, maternal grandmother, and mother; Hypertension in her mother; Kidney disease in her paternal grandfather; Other in her maternal grandfather, mother, and paternal grandfather; Pneumonia in her mother; Stroke in her maternal grandmother.     Assessment/Plan:   Eosinophilic asthma, centrilobular/paraseptal emphysema. - singulair caused nightmares - intolerant of prednisone, but can use medrol - has been using dupixent  since August 2021, and this has helped reduced exacerbations; plan to continue dupixent - continue symbicort 80 two puffs bid - prn albuterol  Allergic rhinitis with post nasal drip. - continue dupixent, flonase, zyrtec  ILD. - mild changes on CT chest from April 2022 - monitor clinically and repeat imaging if she has progressive symptoms   Obstructive sleep apnea. - she is compliant with CPAP and reports benefit from therapy - uses [employer] for her DME - her current CPAP is more than 84 years old - will arrange for a new [employer] auto CPAP 5 to 15 cm H2O  Time Spent Involved in Patient Care on Day of Examination:  27 minutes  Follow up:   Patient Instructions  Will arrange for a new CPAP machine  Follow up in 4 months with Dr. Vassie Loll  Medication List:   Allergies as of 06/15/2023       Reactions   Prednisone Other (See Comments)   Confusion, dizziness Medrol is ok   Other Other (See Comments)   Patient states that she cannot have any spicy foods   Codeine Nausea Only       Tramadol Itching        Medication List        Accurate as of June 15, 2023  3:56 PM. If you have any questions, ask your nurse or doctor.          albuterol 108 (90 Base) MCG/ACT inhaler Commonly known as: VENTOLIN HFA INHALE 1-2 PUFFS INTO THE LUNGS EVERY 6 HOURS AS NEEDED. What changed: Another medication with the same name was removed. Continue taking this medication, and follow the directions you see here. Changed by: Coralyn Helling   ALPRAZolam 0.5 MG tablet Commonly known as: XANAX TAKE 1/2 TO 1 TABLET BY MOUTH 3 TIMES DAILY AS NEEDED FOR ANXIETY   amLODipine 5 MG tablet Commonly known as: NORVASC TAKE 1 TABLET BY MOUTH DAILY   budesonide-formoterol 80-4.5 MCG/ACT inhaler Commonly known as: Symbicort Inhale 2 puffs into the lungs 2 (two) times daily.   Dupixent 300 MG/2ML Sopn Generic drug: Dupilumab Inject 300 mg into the skin every 14 (fourteen) days.   escitalopram 20 MG  tablet Commonly known as: Lexapro Take 1 tablet (20 mg total) by mouth daily.   fluticasone 50 MCG/ACT nasal spray Commonly known as: FLONASE PLACE 1 SPRAY INTO BOTH NOSTRILS DAILY.   furosemide 20 MG tablet Commonly known as: LASIX TAKE 1 TABLET BY MOUTH DAILY   gabapentin 300 MG capsule Commonly known as: NEURONTIN Take 1 capsule (300 mg total) by mouth at bedtime. What changed: Another medication with the same name was removed. Continue taking this medication, and follow the directions you see here. Changed by: Coralyn Helling   HYDROcodone-acetaminophen 5-325 MG tablet Commonly known as: NORCO/VICODIN Take 1 tablet by mouth every 12 (twelve) hours as needed for moderate pain.   ibuprofen 200 MG tablet Commonly known as: ADVIL Take 200 mg by mouth every 6 (six) hours as needed.   levothyroxine 112 MCG tablet Commonly known as: SYNTHROID  TAKE 1 TABLET BY MOUTH DAILY   meclizine 25 MG tablet Commonly known as: ANTIVERT   pantoprazole 40 MG tablet Commonly known as: PROTONIX TAKE 1 TABLET BY MOUTH EVERY DAY 30 MINUTES BEFORE DINNER   VITAMIN B-12 IJ Inject 1,000 mcg as directed every 14 (fourteen) days.   Vitamin D (Ergocalciferol) 1.25 MG (50000 UNIT) Caps capsule Commonly known as: DRISDOL Take 1 capsule (50,000 Units total) by mouth every 7 (seven) days.        Signature:  Coralyn Helling, MD [facility] Pulmonary/Critical Care Pager - [phone] 06/15/2023, 3:56 PM

## 2023-06-17 ENCOUNTER — Ambulatory Visit (INDEPENDENT_AMBULATORY_CARE_PROVIDER_SITE_OTHER): Payer: Medicare Other | Admitting: Family Medicine

## 2023-06-17 DIAGNOSIS — E538 Deficiency of other specified B group vitamins: Secondary | ICD-10-CM

## 2023-06-17 MED ORDER — CYANOCOBALAMIN 1000 MCG/ML IJ SOLN
1000.0000 ug | Freq: Once | INTRAMUSCULAR | Status: AC
Start: 2023-06-17 — End: 2024-02-22
  Administered 2024-02-22: 1000 ug via INTRAMUSCULAR

## 2023-06-17 NOTE — Progress Notes (Signed)
Given in left deltoid No complaints

## 2023-06-29 ENCOUNTER — Encounter (HOSPITAL_BASED_OUTPATIENT_CLINIC_OR_DEPARTMENT_OTHER): Payer: Self-pay | Admitting: Pulmonary Disease

## 2023-06-30 ENCOUNTER — Encounter: Payer: Medicare Other | Admitting: Pharmacist

## 2023-07-01 ENCOUNTER — Ambulatory Visit (INDEPENDENT_AMBULATORY_CARE_PROVIDER_SITE_OTHER): Payer: Medicare Other | Admitting: Family Medicine

## 2023-07-01 DIAGNOSIS — E538 Deficiency of other specified B group vitamins: Secondary | ICD-10-CM | POA: Diagnosis not present

## 2023-07-01 MED ORDER — CYANOCOBALAMIN 1000 MCG/ML IJ SOLN
1000.0000 ug | Freq: Once | INTRAMUSCULAR | Status: AC
Start: 2023-07-01 — End: 2023-07-01
  Administered 2023-07-01: 1000 ug via INTRAMUSCULAR

## 2023-07-01 NOTE — Progress Notes (Signed)
B12 injection was given in Rt deltoid-No issues Pt reports she has issues with left arm every now and then.

## 2023-07-07 ENCOUNTER — Encounter: Payer: Self-pay | Admitting: Family Medicine

## 2023-07-14 ENCOUNTER — Ambulatory Visit: Payer: Medicare Other

## 2023-07-15 ENCOUNTER — Ambulatory Visit: Payer: Medicare Other

## 2023-07-18 ENCOUNTER — Encounter: Payer: Self-pay | Admitting: Podiatry

## 2023-07-19 ENCOUNTER — Encounter: Payer: Self-pay | Admitting: Podiatry

## 2023-07-19 ENCOUNTER — Ambulatory Visit (INDEPENDENT_AMBULATORY_CARE_PROVIDER_SITE_OTHER): Payer: Medicare Other

## 2023-07-19 ENCOUNTER — Ambulatory Visit: Payer: Medicare Other | Admitting: Podiatry

## 2023-07-19 VITALS — Ht 64.0 in | Wt 209.0 lb

## 2023-07-19 DIAGNOSIS — M778 Other enthesopathies, not elsewhere classified: Secondary | ICD-10-CM

## 2023-07-19 DIAGNOSIS — G629 Polyneuropathy, unspecified: Secondary | ICD-10-CM

## 2023-07-19 DIAGNOSIS — R52 Pain, unspecified: Secondary | ICD-10-CM

## 2023-07-19 DIAGNOSIS — Q828 Other specified congenital malformations of skin: Secondary | ICD-10-CM | POA: Diagnosis not present

## 2023-07-19 DIAGNOSIS — M65972 Unspecified synovitis and tenosynovitis, left ankle and foot: Secondary | ICD-10-CM

## 2023-07-19 NOTE — Progress Notes (Signed)
Subjective: Chief Complaint  Patient presents with   Callouses    Patient is here for callous F/u     82 year old female presents the office today for concerns of calluses to her left foot.  She states that they have also become painful.   Also on gabapentin for neuropathy.  She gets tingling to her feet at night.  She also has new complaints of pain to her left ankle.  No injuries but she was on anteromedial aspect ankle joint history of discomfort.  No recent treatment for this.  No swelling or redness.  Objective: AAO x3, NAD DP/PT pulses palpable bilaterally, CRT less than 3 seconds Hyperkeratotic lesions left foot submetatarsal 1, 5 without any underlying ulceration drainage or signs of infection.  No edema, erythema.  Prominent metatarsal heads. Toenails are mildly elongated.   Tenderness to palpation on the anterior medial aspect of the ankle joint.  There is no crepitation or restriction with ankle joint range of motion.  No erythema or warmth.  No erythema point tenderness otherwise.  Flexor, extensor tendons.  Intact. No pain with calf compression, swelling, warmth, erythema  Assessment: Hyperkeratotic lesions left foot; neuropathy  Plan: -All treatment options discussed with the patient including all alternatives, risks, complications.  -X-rays obtained reviewed of left ankle.  3 views of the ankle were obtained.  There is no evidence of acute.  Joint space maintained. -Steroid injection performed ankle joint.  Discussed risks and verbal consent obtained.  There is still present on alcohol.  Mixture of 1 cc Kenalog 10, 0.5 cc of Marcaine plain, 0.5 cc of lidocaine plain was infiltrated into the anterior medial aspect ankle joint no complications.  Postinjection care discussed.  Tolerated well. -Sharply debrided the hyperkeratotic lesions x2 without any complications or bleeding. -Discussed gabapentin increase it to twice a day.  Monitoring side effects. -Moisturizer,  offloading. -Patient encouraged to call the office with any questions, concerns, change in symptoms.   Vivi Barrack DPM

## 2023-07-19 NOTE — Patient Instructions (Signed)

## 2023-07-23 ENCOUNTER — Telehealth: Payer: Self-pay | Admitting: Family Medicine

## 2023-07-23 ENCOUNTER — Encounter: Payer: Self-pay | Admitting: Family Medicine

## 2023-07-23 NOTE — Telephone Encounter (Signed)
[facility]) - [place], [place] - [address] [place] -CSR called needs verbal order for manufacturer change for levothyroxine levothyroxine (SYNTHROID) 112 MCG tablet.   Phone: [phone]  Both Pt and Doctor will be contacted.

## 2023-07-23 NOTE — Telephone Encounter (Signed)
Called back and provided verbal order due to no note in the chart for DAW

## 2023-07-26 ENCOUNTER — Other Ambulatory Visit: Payer: Self-pay | Admitting: Family Medicine

## 2023-07-27 NOTE — Telephone Encounter (Signed)
Noted. Will submit after 10/16/2023

## 2023-07-27 NOTE — Telephone Encounter (Signed)
Called pt left vm to call office tomorrow to make this appointment.

## 2023-07-27 NOTE — Telephone Encounter (Signed)
Patient is requesting a refill of the following medications: Requested Prescriptions   Pending Prescriptions Disp Refills   ALPRAZolam (XANAX) 0.5 MG tablet [Pharmacy Med Name: ALPRAZolam 0.5 MG Oral Tablet] 90 tablet     Sig: TAKE 1/2 TO 1 TABLET BY MOUTH 3 TIMES DAILY AS NEEDED FOR ANXIETY    Date of patient request: 07/26/23 Last office visit: 04/08/23 Date of last refill: 04/23/23 Last refill amount: 90 Follow up time period per chart: 08/23/23

## 2023-07-28 DIAGNOSIS — H26491 Other secondary cataract, right eye: Secondary | ICD-10-CM | POA: Diagnosis not present

## 2023-07-28 DIAGNOSIS — H16223 Keratoconjunctivitis sicca, not specified as Sjogren's, bilateral: Secondary | ICD-10-CM | POA: Diagnosis not present

## 2023-08-04 ENCOUNTER — Ambulatory Visit: Payer: Medicare Other | Admitting: *Deleted

## 2023-08-04 DIAGNOSIS — Z0000 Encounter for general adult medical examination without abnormal findings: Secondary | ICD-10-CM | POA: Diagnosis not present

## 2023-08-04 NOTE — Progress Notes (Signed)
Subjective:   Jenny [name] is a 82 y.o. female who presents for Medicare Annual (Subsequent) preventive examination.  Visit Complete: Virtual I connected with  Jenny [name] on 08/04/23 by a audio enabled telemedicine application and verified that I am speaking with the correct person using two identifiers.  Patient Location: Home  Provider Location: Home Office  I discussed the limitations of evaluation and management by telemedicine. The patient expressed understanding and agreed to proceed.  Vital Signs: Because this visit was a virtual/telehealth visit, some criteria may be missing or patient reported. Any vitals not documented were not able to be obtained and vitals that have been documented are patient reported.  Patient Medicare AWV questionnaire was completed by the patient on 08-02-2023; I have confirmed that all information answered by patient is correct and no changes since this date.  Cardiac Risk Factors include: advanced age (>52men, >58 women);hypertension     Objective:    Today's Vitals   08/04/23 1106  PainSc: 3    There is no height or weight on file to calculate BMI.     08/04/2023   11:08 AM 07/14/2022   12:53 PM 04/02/2022    9:45 AM 07/04/2021    3:07 PM 09/29/2020    9:32 PM 05/07/2020   10:21 AM 04/28/2020    9:05 AM  Advanced Directives  Does Patient Have a Medical Advance Directive? Yes Yes No Yes No Yes Yes  Type of Estate agent of [place];Living will Healthcare Power of [place];Living will  Living will;Healthcare Power of [employer] Power of [place];Living will Healthcare Power of [place];Living will  Does patient want to make changes to medical advance directive?    Yes (MAU/Ambulatory/Procedural Areas - Information given)     Copy of Healthcare Power of Attorney in Chart? No - copy requested No - copy requested  No - copy requested  No - copy requested   Would patient like information on creating a  medical advance directive?   No - Patient declined  No - Patient declined      Current Medications (verified) Outpatient Encounter Medications as of 08/04/2023  Medication Sig   albuterol (VENTOLIN HFA) 108 (90 Base) MCG/ACT inhaler INHALE 1-2 PUFFS INTO THE LUNGS EVERY 6 HOURS AS NEEDED.   ALPRAZolam (XANAX) 0.5 MG tablet TAKE 1/2 TO 1 TABLET BY MOUTH 3  TIMES DAILY AS NEEDED FOR  ANXIETY   amLODipine (NORVASC) 5 MG tablet TAKE 1 TABLET BY MOUTH DAILY   budesonide-formoterol (SYMBICORT) 80-4.5 MCG/ACT inhaler Inhale 2 puffs into the lungs 2 (two) times daily.   Cyanocobalamin (VITAMIN B-12 IJ) Inject 1,000 mcg as directed every 14 (fourteen) days.   Dupilumab (DUPIXENT) 300 MG/2ML SOPN Inject 300 mg into the skin every 14 (fourteen) days.   escitalopram (LEXAPRO) 20 MG tablet Take 1 tablet (20 mg total) by mouth daily.   fluticasone (FLONASE) 50 MCG/ACT nasal spray PLACE 1 SPRAY INTO BOTH NOSTRILS DAILY.   furosemide (LASIX) 20 MG tablet TAKE 1 TABLET BY MOUTH DAILY   gabapentin (NEURONTIN) 300 MG capsule Take 1 capsule (300 mg total) by mouth at bedtime.   HYDROcodone-acetaminophen (NORCO/VICODIN) 5-325 MG tablet Take 1 tablet by mouth every 12 (twelve) hours as needed for moderate pain.   ibuprofen (ADVIL) 200 MG tablet Take 200 mg by mouth every 6 (six) hours as needed.   levothyroxine (SYNTHROID) 112 MCG tablet TAKE 1 TABLET BY MOUTH DAILY   meclizine (ANTIVERT) 25 MG tablet  pantoprazole (PROTONIX) 40 MG tablet TAKE 1 TABLET BY MOUTH EVERY DAY 30 MINUTES BEFORE DINNER   Vitamin D, Ergocalciferol, (DRISDOL) 1.25 MG (50000 UNIT) CAPS capsule Take 1 capsule (50,000 Units total) by mouth every 7 (seven) days.   [DISCONTINUED] esomeprazole (NEXIUM) 40 MG capsule Take 40 mg by mouth daily before breakfast.     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   Anemia 09/21/2011   Asthma 01/14/2011   Atrophic vaginitis 06/17/2012   BACK PAIN, LUMBAR 08/23/2007   DEGENERATIVE JOINT DISEASE 02/22/2008   Depression with anxiety 03/06/2008   Qualifier: Diagnosis of  By: Kriste Basque MD, Lonzo Cloud    DIVERTICULOSIS OF COLON 02/22/2008   GERD (gastroesophageal reflux disease)    HYPERTENSION 02/22/2008   HYPOTHYROIDISM 08/23/2007   Interstitial lung disease (HCC)    OBESITY 08/23/2007   Peripheral neuropathy 02/12/2011   possibly related to spondylolisthesis per pt   Pneumonia due to COVID-19 virus 09/2020   PONV (postoperative nausea and vomiting)    Sleep apnea    VENOUS INSUFFICIENCY 08/23/2007   VITAMIN D DEFICIENCY 08/21/2008   Past Surgical History:  Procedure Laterality Date   ABDOMINAL HYSTERECTOMY     took one ovary   AP repair and sling for cystocele  8-04    Dr Patsi Sears   APPENDECTOMY     carpal tunel  2008   CHOLECYSTECTOMY     ctr     bilateral   EYE SURGERY     left hand  2010   left thumb, joint removal, CTR   NECK SURGERY     x 2 - discectomy, fusion   SHOULDER ARTHROSCOPY DISTAL CLAVICLE EXCISION AND OPEN ROTATOR CUFF REPAIR     right   TONSILLECTOMY AND ADENOIDECTOMY     TUBAL LIGATION     Family History  Problem Relation Age of Onset   COPD Mother    Pneumonia Mother    Other Mother        CHF   Hypertension Mother    Arthritis Mother        septic knee after replacement   Heart disease Mother        chf   Dementia Father    Arthritis Brother    Asthma Daughter    Colon polyps Daughter    Stroke Maternal Grandmother    Heart disease Maternal Grandmother    Other Maternal Grandfather        CHF   Heart disease Maternal Grandfather        chf   Cancer Paternal Grandmother        stomach?   Other Paternal Grandfather        problems with kidneys   Kidney disease  Paternal Grandfather    Anxiety disorder Daughter    Cancer Maternal Aunt        breast   Cancer Maternal Aunt        liver   Colon cancer Neg Hx    Social History   Socioeconomic History   Marital status: Widowed    Spouse name: Not on file   Number of children: Not on file   Years of education: Not on file   Highest education level: GED or equivalent  Occupational History  Occupation: Retired  Tobacco Use   Smoking status: Former    Current packs/day: 0.00    Average packs/day: 1.5 packs/day for 30.0 years (45.0 ttl pk-yrs)    Types: Cigarettes    Start date: 10/12/1961    Quit date: 10/13/1991    Years since quitting: 31.8   Smokeless tobacco: Never  Vaping Use   Vaping status: Never Used  Substance and Sexual Activity   Alcohol use: No    Alcohol/week: 0.0 standard drinks of alcohol   Drug use: No   Sexual activity: Never  Other Topics Concern   Not on file  Social History Narrative   Not on file   Social Determinants of Health   Financial Resource Strain: Low Risk  (08/04/2023)   Overall Financial Resource Strain (CARDIA)    Difficulty of Paying Living Expenses: Not very hard  Food Insecurity: No Food Insecurity (08/04/2023)   Hunger Vital Sign    Worried About Running Out of Food in the Last Year: Never true    Ran Out of Food in the Last Year: Never true  Transportation Needs: No Transportation Needs (08/04/2023)   PRAPARE - Administrator, Civil Service (Medical): No    Lack of Transportation (Non-Medical): No  Physical Activity: Inactive (08/04/2023)   Exercise Vital Sign    Days of Exercise per Week: 0 days    Minutes of Exercise per Session: 0 min  Stress: Stress Concern Present (08/04/2023)   [employer] of Occupational Health - Occupational Stress Questionnaire    Feeling of Stress : To some extent  Social Connections: Moderately Isolated (08/04/2023)   Social Connection and Isolation Panel [NHANES]    Frequency of Communication  with Friends and Family: More than three times a week    Frequency of Social Gatherings with Friends and Family: Three times a week    Attends Religious Services: More than 4 times per year    Active Member of Clubs or Organizations: No    Attends Banker Meetings: Never    Marital Status: Widowed    Tobacco Counseling Counseling given: Not Answered   Clinical Intake:  Pre-visit preparation completed: Yes  Pain : 0-10 Pain Score: 3  Pain Type: Chronic pain Pain Location: Shoulder Pain Orientation: Left Pain Descriptors / Indicators: Aching, Burning Pain Onset: More than a month ago Pain Frequency: Intermittent Pain Relieving Factors: cortisone shots  Pain Relieving Factors: cortisone shots  Diabetes: No  How often do you need to have someone help you when you read instructions, pamphlets, or other written materials from your doctor or pharmacy?: 1 - Never  Interpreter Needed?: No  Information entered by :: Remi Haggard LPN   Activities of Daily Living    08/04/2023   11:05 AM 08/02/2023    5:31 PM  In your present state of health, do you have any difficulty performing the following activities:  Hearing? 1 1  Vision? 1 1  Difficulty concentrating or making decisions? 0 0  Walking or climbing stairs? 1 1  Dressing or bathing? 0 0  Doing errands, shopping? 0 0  Preparing Food and eating ? N N  Using the Toilet? N N  In the past six months, have you accidently leaked urine? Y Y  Do you have problems with loss of bowel control? Y Y  Managing your Medications? N N  Managing your Finances? N N  Housekeeping or managing your Housekeeping? N N    Patient Care Team: Sheliah Hatch,  MD as PCP - General (Family Medicine) Michele Mcalpine, MD as Consulting Physician (Pulmonary Disease) Meryl Dare, MD as Consulting Physician (Gastroenterology) Elinor Parkinson, [place] as Consulting Physician (Podiatry) Marcelle Overlie, MD as Consulting Physician  (Obstetrics and Gynecology) Erroll Luna, [facility] (Inactive) (Pharmacist)  Indicate any recent Medical Services you may have received from other than [facility] providers in the past year (date may be approximate).     Assessment:   This is a routine wellness examination for [place].  Hearing/Vision screen Hearing Screening - Comments:: Some trouble hearing Does not wear hearing aids Vision Screening - Comments:: Up to date Selinda Orion for Cataracts   Goals Addressed             This Visit's Progress    Increase physical activity       Would like to move more       Depression Screen    08/04/2023   11:10 AM 04/22/2023    8:23 AM 03/04/2023    9:11 AM 07/14/2022   12:51 PM 04/02/2022    8:34 AM 08/20/2021    4:09 PM 07/04/2021    3:12 PM  PHQ 2/9 Scores  PHQ - 2 Score 2 0 4 0 0 4 6  PHQ- 9 Score 4 0 9  0 10 13    Fall Risk    08/04/2023   11:06 AM 04/22/2023    8:23 AM 04/08/2023    8:19 AM 03/04/2023    9:11 AM 07/14/2022   12:50 PM  Fall Risk   Falls in the past year? 1 0 1 1 1   Number falls in past yr: 1 0 0 1 1  Injury with Fall? 0 0 0 0 1  Risk for fall due to : Medication side effect No Fall Risks No Fall Risks History of fall(s);Orthopedic patient History of fall(s);Impaired balance/gait;Orthopedic patient  Follow up Falls evaluation completed;Education provided;Falls prevention discussed Falls evaluation completed Falls evaluation completed Falls evaluation completed Education provided;Falls prevention discussed    MEDICARE RISK AT HOME: Medicare Risk at Home Any stairs in or around the home?: Yes If so, are there any without handrails?: No Home free of loose throw rugs in walkways, pet beds, electrical cords, etc?: Yes Adequate lighting in your home to reduce risk of falls?: Yes Life alert?: No Use of a cane, walker or w/c?: Yes Grab bars in the bathroom?: No Shower chair or bench in shower?: No Elevated toilet seat or a handicapped toilet?: No  TIMED  UP AND GO:  Was the test performed?  No    Cognitive Function:        08/04/2023   11:15 AM 07/14/2022   12:53 PM 07/04/2021    3:15 PM 04/01/2020    9:14 AM  6CIT Screen  What Year? 0 points 0 points 0 points 0 points  What month? 0 points 0 points 0 points 0 points  What time? 0 points 0 points 0 points 0 points  Count back from 20 0 points 0 points 0 points 0 points  Months in reverse 0 points 0 points 0 points 0 points  Repeat phrase 0 points 0 points 0 points 0 points  Total Score 0 points 0 points 0 points 0 points    Immunizations Immunization History  Administered Date(s) Administered   Fluad Quad(high Dose 65+) 08/02/2019, 08/05/2020, 07/05/2021, 11/16/2022   Influenza Split 08/20/2011, 09/30/2012   Influenza Whole 08/13/2013   Influenza, High Dose Seasonal PF 10/13/2014, 09/03/2015, 06/03/2017, 09/25/2018  Influenza-Unspecified 07/11/2016, 07/06/2021   Moderna Sars-Covid-2 Vaccination 11/17/2019, 12/24/2019   Pneumococcal Conjugate-13 03/16/2014   Pneumococcal Polysaccharide-23 06/23/2016   Tdap 09/22/2011   Zoster Recombinant(Shingrix) 03/16/2014   Zoster, Live 03/16/2014    TDAP status: Due, Education has been provided regarding the importance of this vaccine. Advised may receive this vaccine at local pharmacy or Health Dept. Aware to provide a copy of the vaccination record if obtained from local pharmacy or Health Dept. Verbalized acceptance and understanding.  Flu Vaccine status: Due, Education has been provided regarding the importance of this vaccine. Advised may receive this vaccine at local pharmacy or Health Dept. Aware to provide a copy of the vaccination record if obtained from local pharmacy or Health Dept. Verbalized acceptance and understanding.  Pneumococcal vaccine status: Up to date  Covid-19 vaccine status: Declined, Education has been provided regarding the importance of this vaccine but patient still declined. Advised may receive this vaccine  at local pharmacy or Health Dept.or vaccine clinic. Aware to provide a copy of the vaccination record if obtained from local pharmacy or Health Dept. Verbalized acceptance and understanding.  Qualifies for Shingles Vaccine? Yes   Zostavax completed Yes   Shingrix Completed?: No.    Education has been provided regarding the importance of this vaccine. Patient has been advised to call insurance company to determine out of pocket expense if they have not yet received this vaccine. Advised may also receive vaccine at local pharmacy or Health Dept. Verbalized acceptance and understanding.  Screening Tests Health Maintenance  Topic Date Due   INFLUENZA VACCINE  05/13/2023   Medicare Annual Wellness (AWV)  08/03/2024   Pneumonia Vaccine 58+ Years old  Completed   DEXA SCAN  Completed   HPV VACCINES  Aged Out   DTaP/Tdap/Td  Discontinued   COVID-19 Vaccine  Discontinued   Zoster Vaccines- Shingrix  Discontinued    Health Maintenance  Health Maintenance Due  Topic Date Due   INFLUENZA VACCINE  05/13/2023    Colorectal cancer screening: No longer required.   Mammogram  no longer required  Bone Density completed 2021  Education provided  Lung Cancer Screening: (Low Dose CT Chest recommended if Age 30-80 years, 20 pack-year currently smoking OR have quit w/in 15years.) does not qualify.   Lung Cancer Screening Referral:   Additional Screening:  Hepatitis C Screening: does not qualify;  Vision Screening: Recommended annual ophthalmology exams for early detection of glaucoma and other disorders of the eye. Is the patient up to date with their annual eye exam?  Yes  Who is the provider or what is the name of the office in which the patient attends annual eye exams? [facility] If pt is not established with a provider, would they like to be referred to a provider to establish care? No .   Dental Screening: Recommended annual dental exams for proper oral hygiene   Community Resource Referral  / Chronic Care Management: CRR required this visit?  No   CCM required this visit?  No     Plan:     I have personally reviewed and noted the following in the patient's chart:   Medical and social history Use of alcohol, tobacco or illicit drugs  Current medications and supplements including opioid prescriptions. Patient is not currently taking opioid prescriptions. Functional ability and status Nutritional status Physical activity Advanced directives List of other physicians Hospitalizations, surgeries, and ER visits in previous 12 months Vitals Screenings to include cognitive, depression, and falls Referrals and appointments  In  addition, I have reviewed and discussed with patient certain preventive protocols, quality metrics, and best practice recommendations. A written personalized care plan for preventive services as well as general preventive health recommendations were provided to patient.     Remi Haggard, LPN   [id number]   After Visit Summary: (MyChart) Due to this being a telephonic visit, the after visit summary with patients personalized plan was offered to patient via MyChart   Nurse Notes:

## 2023-08-04 NOTE — Patient Instructions (Signed)
Jenny [name] , Thank you for taking time to come for your Medicare Wellness Visit. I appreciate your ongoing commitment to your health goals. Please review the following plan we discussed and let me know if I can assist you in the future.   Screening recommendations/referrals: Colonoscopy: no longer required Mammogram:  Bone Density:  Recommended yearly ophthalmology/optometry visit for glaucoma screening and checkup Recommended yearly dental visit for hygiene and checkup  Vaccinations: Influenza vaccine:  Pneumococcal vaccine: up to date Tdap vaccine: Education provided Shingles vaccine:        Preventive Care 65 Years and Older, Female Preventive care refers to lifestyle choices and visits with your health care provider that can promote health and wellness. What does preventive care include? A yearly physical exam. This is also called an annual well check. Dental exams once or twice a year. Routine eye exams. Ask your health care provider how often you should have your eyes checked. Personal lifestyle choices, including: Daily care of your teeth and gums. Regular physical activity. Eating a healthy diet. Avoiding tobacco and drug use. Limiting alcohol use. Practicing safe sex. Taking low-dose aspirin every day. Taking vitamin and mineral supplements as recommended by your health care provider. What happens during an annual well check? The services and screenings done by your health care provider during your annual well check will depend on your age, overall health, lifestyle risk factors, and family history of disease. Counseling  Your health care provider may ask you questions about your: Alcohol use. Tobacco use. Drug use. Emotional well-being. Home and relationship well-being. Sexual activity. Eating habits. History of falls. Memory and ability to understand (cognition). Work and work Astronomer. Reproductive health. Screening  You may have the following tests or  measurements: Height, weight, and BMI. Blood pressure. Lipid and cholesterol levels. These may be checked every 5 years, or more frequently if you are over 63 years old. Skin check. Lung cancer screening. You may have this screening every year starting at age 84 if you have a 30-pack-year history of smoking and currently smoke or have quit within the past 15 years. Fecal occult blood test (FOBT) of the stool. You may have this test every year starting at age 84. Flexible sigmoidoscopy or colonoscopy. You may have a sigmoidoscopy every 5 years or a colonoscopy every 10 years starting at age 84. Hepatitis C blood test. Hepatitis B blood test. Sexually transmitted disease (STD) testing. Diabetes screening. This is done by checking your blood sugar (glucose) after you have not eaten for a while (fasting). You may have this done every 1-3 years. Bone density scan. This is done to screen for osteoporosis. You may have this done starting at age 84. Mammogram. This may be done every 1-2 years. Talk to your health care provider about how often you should have regular mammograms. Talk with your health care provider about your test results, treatment options, and if necessary, the need for more tests. Vaccines  Your health care provider may recommend certain vaccines, such as: Influenza vaccine. This is recommended every year. Tetanus, diphtheria, and acellular pertussis (Tdap, Td) vaccine. You may need a Td booster every 10 years. Zoster vaccine. You may need this after age 24. Pneumococcal 13-valent conjugate (PCV13) vaccine. One dose is recommended after age 17. Pneumococcal polysaccharide (PPSV23) vaccine. One dose is recommended after age 57. Talk to your health care provider about which screenings and vaccines you need and how often you need them. This information is not intended to replace advice given  to you by your health care provider. Make sure you discuss any questions you have with your  health care provider. Document Released: 10/25/2015 Document Revised: 06/17/2016 Document Reviewed: 07/30/2015 Elsevier Interactive Patient Education  2017 [employer].  Fall Prevention in the Home Falls can cause injuries. They can happen to people of all ages. There are many things you can do to make your home safe and to help prevent falls. What can I do on the outside of my home? Regularly fix the edges of walkways and driveways and fix any cracks. Remove anything that might make you trip as you walk through a door, such as a raised step or threshold. Trim any bushes or trees on the path to your home. Use bright outdoor lighting. Clear any walking paths of anything that might make someone trip, such as rocks or tools. Regularly check to see if handrails are loose or broken. Make sure that both sides of any steps have handrails. Any raised decks and porches should have guardrails on the edges. Have any leaves, snow, or ice cleared regularly. Use sand or salt on walking paths during winter. Clean up any spills in your garage right away. This includes oil or grease spills. What can I do in the bathroom? Use night lights. Install grab bars by the toilet and in the tub and shower. Do not use towel bars as grab bars. Use non-skid mats or decals in the tub or shower. If you need to sit down in the shower, use a plastic, non-slip stool. Keep the floor dry. Clean up any water that spills on the floor as soon as it happens. Remove soap buildup in the tub or shower regularly. Attach bath mats securely with double-sided non-slip rug tape. Do not have throw rugs and other things on the floor that can make you trip. What can I do in the bedroom? Use night lights. Make sure that you have a light by your bed that is easy to reach. Do not use any sheets or blankets that are too big for your bed. They should not hang down onto the floor. Have a firm chair that has side arms. You can use this for  support while you get dressed. Do not have throw rugs and other things on the floor that can make you trip. What can I do in the kitchen? Clean up any spills right away. Avoid walking on wet floors. Keep items that you use a lot in easy-to-reach places. If you need to reach something above you, use a strong step stool that has a grab bar. Keep electrical cords out of the way. Do not use floor polish or wax that makes floors slippery. If you must use wax, use non-skid floor wax. Do not have throw rugs and other things on the floor that can make you trip. What can I do with my stairs? Do not leave any items on the stairs. Make sure that there are handrails on both sides of the stairs and use them. Fix handrails that are broken or loose. Make sure that handrails are as long as the stairways. Check any carpeting to make sure that it is firmly attached to the stairs. Fix any carpet that is loose or worn. Avoid having throw rugs at the top or bottom of the stairs. If you do have throw rugs, attach them to the floor with carpet tape. Make sure that you have a light switch at the top of the stairs and the bottom of the  stairs. If you do not have them, ask someone to add them for you. What else can I do to help prevent falls? Wear shoes that: Do not have high heels. Have rubber bottoms. Are comfortable and fit you well. Are closed at the toe. Do not wear sandals. If you use a stepladder: Make sure that it is fully opened. Do not climb a closed stepladder. Make sure that both sides of the stepladder are locked into place. Ask someone to hold it for you, if possible. Clearly mark and make sure that you can see: Any grab bars or handrails. First and last steps. Where the edge of each step is. Use tools that help you move around (mobility aids) if they are needed. These include: Canes. Walkers. Scooters. Crutches. Turn on the lights when you go into a dark area. Replace any light bulbs as soon  as they burn out. Set up your furniture so you have a clear path. Avoid moving your furniture around. If any of your floors are uneven, fix them. If there are any pets around you, be aware of where they are. Review your medicines with your doctor. Some medicines can make you feel dizzy. This can increase your chance of falling. Ask your doctor what other things that you can do to help prevent falls. This information is not intended to replace advice given to you by your health care provider. Make sure you discuss any questions you have with your health care provider. Document Released: 07/25/2009 Document Revised: 03/05/2016 Document Reviewed: 11/02/2014 Elsevier Interactive Patient Education  2017 [employer].

## 2023-08-13 ENCOUNTER — Telehealth: Payer: Self-pay

## 2023-08-13 ENCOUNTER — Ambulatory Visit: Payer: Medicare Other | Admitting: Surgical

## 2023-08-13 ENCOUNTER — Telehealth: Payer: Self-pay | Admitting: Surgical

## 2023-08-13 DIAGNOSIS — M75102 Unspecified rotator cuff tear or rupture of left shoulder, not specified as traumatic: Secondary | ICD-10-CM

## 2023-08-13 DIAGNOSIS — M1712 Unilateral primary osteoarthritis, left knee: Secondary | ICD-10-CM

## 2023-08-13 NOTE — Telephone Encounter (Signed)
Noted. Will submit after 10/16/2023 due to insurance changes. Patient will need appointment after 10/20/2023.

## 2023-08-13 NOTE — Telephone Encounter (Signed)
Auth needed for left knee gel to be done in January

## 2023-08-13 NOTE — Telephone Encounter (Signed)
Pt requesting Gel injection for knee pain

## 2023-08-13 NOTE — Telephone Encounter (Signed)
See previous message in chart.  

## 2023-08-15 ENCOUNTER — Encounter: Payer: Self-pay | Admitting: Surgical

## 2023-08-15 MED ORDER — LIDOCAINE HCL 1 % IJ SOLN
5.0000 mL | INTRAMUSCULAR | Status: AC | PRN
Start: 2023-08-13 — End: 2023-08-13
  Administered 2023-08-13: 5 mL

## 2023-08-15 MED ORDER — BUPIVACAINE HCL 0.5 % IJ SOLN
9.0000 mL | INTRAMUSCULAR | Status: AC | PRN
Start: 2023-08-13 — End: 2023-08-13
  Administered 2023-08-13: 9 mL via INTRA_ARTICULAR

## 2023-08-15 MED ORDER — METHYLPREDNISOLONE ACETATE 40 MG/ML IJ SUSP
40.0000 mg | INTRAMUSCULAR | Status: AC | PRN
Start: 2023-08-13 — End: 2023-08-13
  Administered 2023-08-13: 40 mg via INTRA_ARTICULAR

## 2023-08-15 MED ORDER — BUPIVACAINE HCL 0.25 % IJ SOLN
4.0000 mL | INTRAMUSCULAR | Status: AC | PRN
Start: 2023-08-13 — End: 2023-08-13
  Administered 2023-08-13: 4 mL via INTRA_ARTICULAR

## 2023-08-15 NOTE — Progress Notes (Signed)
Office Visit Note   Patient: Jenny [name]           Date of Birth: June 05, 1941           MRN: [id number] Visit Date: 08/13/2023 Requested by: Sheliah Hatch, MD [address] [place],  [place] [address] PCP: Sheliah Hatch, MD  Subjective: Chief Complaint  Patient presents with   Left Shoulder - Pain    Wants injection    HPI: Jenny [name] is a 82 y.o. female who presents to the office reporting left shoulder injection.  Has history of large full-thickness tear of the supraspinatus.  Has had prior glenohumeral injection by Dr. August Saucer and subacromial injection by myself.  States that the subacromial injection was more helpful for her pain and she would like to repeat this today.  She also reports history of left knee arthritis and would like to try left knee injection.  Gel injections have been more helpful for her than the cortisone injections but it is too soon to repeat gel injection which was recently done in July 2024.              ROS: All systems reviewed are negative as they relate to the chief complaint within the history of present illness.  Patient denies fevers or chills.  Assessment & Plan: Visit Diagnoses:  1. Tear of left supraspinatus tendon   2. Unilateral primary osteoarthritis, left knee     Plan: Patient is a 82 year old female who presents for evaluation of left shoulder pain and left knee pain.  She requests subacromial injection into the left shoulder today and this was administered without complication.  Patient Toller procedure well.  Also administered left knee cortisone injection and we will see her back in January 2025 for repeat of the left knee gel injection.  Follow-up as needed otherwise.  This patient is diagnosed with osteoarthritis of the knee(s).    Radiographs show evidence of joint space narrowing, osteophytes, subchondral sclerosis and/or subchondral cysts.  This patient has knee pain which interferes with functional and  activities of daily living.    This patient has experienced inadequate response, adverse effects and/or intolerance with conservative treatments such as acetaminophen, NSAIDS, topical creams, physical therapy or regular exercise, knee bracing and/or weight loss.   This patient has experienced inadequate response or has a contraindication to intra articular steroid injections for at least 3 months.   This patient is not scheduled to have a total knee replacement within 6 months of starting treatment with viscosupplementation.   Follow-Up Instructions: No follow-ups on file.   Orders:  No orders of the defined types were placed in this encounter.  No orders of the defined types were placed in this encounter.     Procedures: Large Joint Inj: L subacromial bursa on 08/13/2023 7:38 AM Indications: diagnostic evaluation and pain Details: 18 G 1.5 in needle, posterior approach  Arthrogram: No  Medications: 9 mL bupivacaine 0.5 %; 40 mg methylPREDNISolone acetate 40 MG/ML; 5 mL lidocaine 1 % Outcome: tolerated well, no immediate complications Procedure, treatment alternatives, risks and benefits explained, specific risks discussed. Consent was given by the patient. Immediately prior to procedure a time out was called to verify the correct patient, procedure, equipment, support staff and site/side marked as required. Patient was prepped and draped in the usual sterile fashion.    Large Joint Inj: L knee on 08/13/2023 7:38 AM Indications: diagnostic evaluation, joint swelling and pain Details: 18 G 1.5  in needle, superolateral approach  Arthrogram: No  Medications: 5 mL lidocaine 1 %; 40 mg methylPREDNISolone acetate 40 MG/ML; 4 mL bupivacaine 0.25 % Outcome: tolerated well, no immediate complications Procedure, treatment alternatives, risks and benefits explained, specific risks discussed. Consent was given by the patient. Immediately prior to procedure a time out was called to verify the  correct patient, procedure, equipment, support staff and site/side marked as required. Patient was prepped and draped in the usual sterile fashion.       Clinical Data: No additional findings.  Objective: Vital Signs: There were no vitals taken for this visit.  Physical Exam:  Constitutional: Patient appears well-developed HEENT:  Head: Normocephalic Eyes:EOM are normal Neck: Normal range of motion Cardiovascular: Normal rate Pulmonary/chest: Effort normal Neurologic: Patient is alert Skin: Skin is warm Psychiatric: Patient has normal mood and affect  Ortho Exam: Ortho exam demonstrates left knee without effusion.  Tenderness over the medial and lateral joint lines.  No cellulitis or skin changes noted.  She is able to perform straight leg raise.  No pain with hip range of motion.  No calf tenderness.  Negative Homans' sign.  Left shoulder with palpable radial pulse of the left upper extremity.  Axillary nerve intact with deltoid firing.  She does have crepitus noted with passive motion of the shoulder.  Specialty Comments:  Narrative & Impression CLINICAL DATA:  Cervical pain for 3-4 months.   EXAM: MRI CERVICAL SPINE WITHOUT CONTRAST   TECHNIQUE: Multiplanar, multisequence MR imaging of the cervical spine was performed. No intravenous contrast was administered.   COMPARISON:  11/21/2017   FINDINGS: Alignment: Physiologic.   Vertebrae: No acute fracture, evidence of discitis, or aggressive bone lesion.   Cord: Focal area of T2 hyperintensity in the cervical spinal cord at C4-5 with mild volume loss as can be seen with myelomalacia.   Posterior Fossa, vertebral arteries, paraspinal tissues: Posterior fossa demonstrates no focal abnormality. Vertebral artery flow voids are maintained. Paraspinal soft tissues are unremarkable.   Disc levels:   Discs: Anterior cervical fusion from C3 through C6. Degenerative disease with disc height loss at C6-7.   C2-3: No  significant disc bulge. No neural foraminal stenosis. No central canal stenosis.   C3-4: Interbody fusion. Mild right foraminal stenosis. No left foraminal stenosis. No spinal stenosis.   C4-5: Interbody fusion. Mild bilateral foraminal stenosis. No spinal stenosis.   C5-6: Interbody fusion.  No foraminal or central canal stenosis.   C6-7: Mild broad-based disc bulge. Bilateral uncovertebral degenerative changes. Severe left and moderate right foraminal stenosis. Mild spinal stenosis.   C7-T1: Mild broad-based disc bulge. Mild bilateral facet arthropathy. Mild left foraminal stenosis. No right foraminal stenosis. No spinal stenosis.   Mild broad-based disc bulge. No foraminal or central canal stenosis. No spinal stenosis.   IMPRESSION: 1. Anterior cervical fusion from C3 through C6. Mild right foraminal stenosis at C3-4. Mild bilateral foraminal stenosis at C4-5. 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 2. At C6-7 there is a mild broad-based disc bulge. Bilateral uncovertebral degenerative changes. Severe left and moderate right foraminal stenosis. Mild spinal stenosis. 3. At C7-T1 there is a mild broad-based disc bulge. Mild bilateral facet arthropathy. Mild left foraminal stenosis.     Electronically Signed   By: Elige Ko M.D.   On: 01/12/2023 13:52  Imaging: No results found.   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   Hyperglycemia 07/07/2015   Annual physical exam 08/06/2013   Atrophic vaginitis 06/17/2012   Peripheral vascular disease, unspecified (HCC) 06/02/2012    Urticaria 10/15/2011   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   Vitamin D deficiency 02/12/2011   Peripheral neuropathy 02/12/2011   Intertrigo 01/21/2011   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   History of chicken pox 01/14/2011   History of measles 01/14/2011   OTHER URINARY INCONTINENCE 03/12/2010   Depression with anxiety 03/06/2008   Essential hypertension 02/22/2008   DIVERTICULOSIS OF COLON 02/22/2008   Unilateral primary osteoarthritis, left knee 02/22/2008   Hypothyroidism 08/23/2007   Morbid obesity (HCC) 08/23/2007   OSA on CPAP 08/23/2007   Venous (peripheral) insufficiency 08/23/2007   Allergic rhinitis 08/23/2007   GERD without esophagitis 08/23/2007   BACK PAIN, LUMBAR 08/23/2007   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   Anemia 09/21/2011   Asthma 01/14/2011   Atrophic vaginitis 06/17/2012   BACK PAIN, LUMBAR 08/23/2007   DEGENERATIVE JOINT DISEASE 02/22/2008   Depression with anxiety 03/06/2008   Qualifier: Diagnosis of  By: Kriste Basque MD, Lonzo Cloud    DIVERTICULOSIS OF COLON 02/22/2008   GERD (gastroesophageal reflux disease)    HYPERTENSION 02/22/2008   HYPOTHYROIDISM 08/23/2007   Interstitial lung disease (HCC)    OBESITY 08/23/2007   Peripheral neuropathy 02/12/2011   possibly related to spondylolisthesis per pt   Pneumonia due to COVID-19 virus 09/2020   PONV (postoperative nausea and vomiting)    Sleep apnea    VENOUS INSUFFICIENCY 08/23/2007   VITAMIN D DEFICIENCY 08/21/2008    Family History  Problem Relation Age of Onset   COPD Mother    Pneumonia Mother    Other Mother        CHF   Hypertension Mother    Arthritis Mother        septic knee after replacement   Heart disease Mother        chf   Dementia Father    Arthritis Brother    Asthma Daughter    Colon polyps Daughter    Stroke Maternal Grandmother    Heart disease Maternal  Grandmother    Other Maternal Grandfather        CHF   Heart disease Maternal Grandfather        chf   Cancer Paternal Grandmother        stomach?   Other Paternal Grandfather        problems with kidneys   Kidney disease Paternal Grandfather    Anxiety disorder Daughter    Cancer Maternal Aunt        breast   Cancer Maternal Aunt        liver   Colon cancer Neg Hx     Past Surgical History:  Procedure Laterality Date   ABDOMINAL HYSTERECTOMY     took one ovary   AP repair and sling for cystocele  8-04    Dr Patsi Sears   APPENDECTOMY     carpal tunel  2008   CHOLECYSTECTOMY     ctr     bilateral   EYE SURGERY     left  hand  2010   left thumb, joint removal, CTR   NECK SURGERY     x 2 - discectomy, fusion   SHOULDER ARTHROSCOPY DISTAL CLAVICLE EXCISION AND OPEN ROTATOR CUFF REPAIR     right   TONSILLECTOMY AND ADENOIDECTOMY     TUBAL LIGATION     Social History   Occupational History   Occupation: Retired  Tobacco Use   Smoking status: Former    Current packs/day: 0.00    Average packs/day: 1.5 packs/day for 30.0 years (45.0 ttl pk-yrs)    Types: Cigarettes    Start date: 10/12/1961    Quit date: 10/13/1991    Years since quitting: 31.8   Smokeless tobacco: Never  Vaping Use   Vaping status: Never Used  Substance and Sexual Activity   Alcohol use: No    Alcohol/week: 0.0 standard drinks of alcohol   Drug use: No   Sexual activity: Never

## 2023-08-20 DIAGNOSIS — Z961 Presence of intraocular lens: Secondary | ICD-10-CM | POA: Diagnosis not present

## 2023-08-20 DIAGNOSIS — H43392 Other vitreous opacities, left eye: Secondary | ICD-10-CM | POA: Diagnosis not present

## 2023-08-20 DIAGNOSIS — H26491 Other secondary cataract, right eye: Secondary | ICD-10-CM | POA: Diagnosis not present

## 2023-08-20 DIAGNOSIS — H18413 Arcus senilis, bilateral: Secondary | ICD-10-CM | POA: Diagnosis not present

## 2023-08-23 ENCOUNTER — Ambulatory Visit: Payer: Medicare Other | Admitting: Family Medicine

## 2023-08-23 ENCOUNTER — Encounter: Payer: Self-pay | Admitting: Family Medicine

## 2023-08-23 VITALS — BP 132/64 | HR 72 | Temp 98.0°F | Ht 64.0 in | Wt 209.2 lb

## 2023-08-23 DIAGNOSIS — Z23 Encounter for immunization: Secondary | ICD-10-CM

## 2023-08-23 DIAGNOSIS — E538 Deficiency of other specified B group vitamins: Secondary | ICD-10-CM

## 2023-08-23 DIAGNOSIS — E559 Vitamin D deficiency, unspecified: Secondary | ICD-10-CM

## 2023-08-23 DIAGNOSIS — Z0000 Encounter for general adult medical examination without abnormal findings: Secondary | ICD-10-CM | POA: Diagnosis not present

## 2023-08-23 NOTE — Assessment & Plan Note (Signed)
Pt's PE WNL w/ exception of BMI.  UTD on immunizations.  No longer doing mammograms or colon cancer screening.  Check labs.  Anticipatory guidance provided.

## 2023-08-23 NOTE — Assessment & Plan Note (Signed)
Ongoing issue.  Pt's BMI of 35.91 along w/ dx of HTN, hyperlipidemia qualifies as morbid obesity.  Encouraged low carb diet and physical activity as able.  Check labs to risk stratify.  Will follow.

## 2023-08-23 NOTE — Patient Instructions (Signed)
Follow up in 6 months to recheck BP and cholesterol We'll notify you of your lab results and make any changes if needed Continue to work on healthy diet and walk as you are able Call with any questions or concerns Stay Safe!  Stay Healthy! Happy Thanksgiving!!

## 2023-08-23 NOTE — Assessment & Plan Note (Signed)
Check labs and replete prn. 

## 2023-08-23 NOTE — Progress Notes (Signed)
   Subjective:    Patient ID: Jenny [name], female    DOB: December 06, 1940, 82 y.o.   MRN: [id number]  HPI CPE- UTD on PNA and flu  Patient Care Team    Relationship Specialty Notifications Start End  Sheliah Hatch, MD PCP - General Family Medicine  11/09/16   Michele Mcalpine, MD Consulting Physician Pulmonary Disease  11/09/16   Meryl Dare, MD Consulting Physician Gastroenterology  11/09/16   Elinor Parkinson, [place] Consulting Physician Podiatry  06/03/17   Marcelle Overlie, MD Consulting Physician Obstetrics and Gynecology  06/03/17   Erroll Luna, [facility] (Inactive)  Pharmacist  12/03/21    Comment: [phone]    Health Maintenance  Topic Date Due   Medicare Annual Wellness (AWV)  08/03/2024   Pneumonia Vaccine 69+ Years old  Completed   INFLUENZA VACCINE  Completed   DEXA SCAN  Completed   HPV VACCINES  Aged Out   DTaP/Tdap/Td  Discontinued   COVID-19 Vaccine  Discontinued   Zoster Vaccines- Shingrix  Discontinued     Review of Systems Patient reports no vision/ hearing changes, adenopathy,fever, weight change,  persistant/recurrent hoarseness , swallowing issues, chest pain, palpitations, edema, persistant/recurrent cough, hemoptysis, dyspnea (rest/exertional/paroxysmal nocturnal), gastrointestinal bleeding (melena, rectal bleeding), significant heartburn, GU symptoms (dysuria, hematuria, incontinence), Gyn symptoms (abnormal  bleeding, pain),  syncope, focal weakness, memory loss, numbness & tingling, skin/hair/nail changes, abnormal bruising or bleeding, anxiety, or depression.   + bowel urgency + ongoing abd pain- sees Dr Russella Dar    Objective:   Physical Exam General Appearance:    Alert, cooperative, no distress, appears stated age, obese  Head:    Normocephalic, without obvious abnormality, atraumatic  Eyes:    PERRL, conjunctiva/corneas clear, EOM's intact both eyes  Ears:    Normal TM's and external ear canals, both ears  Nose:   Nares normal, septum midline,  mucosa normal, no drainage    or sinus tenderness  Throat:   Lips, mucosa, and tongue normal; teeth and gums normal  Neck:   Supple, symmetrical, trachea midline, no adenopathy;    Thyroid: no enlargement/tenderness/nodules  Back:     Symmetric, no curvature, ROM normal, no CVA tenderness  Lungs:     Clear to auscultation bilaterally, respirations unlabored  Chest Wall:    No tenderness or deformity   Heart:    Regular rate and rhythm, S1 and S2 normal, no murmur, rub   or gallop  Breast Exam:    Deferred  Abdomen:     Soft, non-tender, bowel sounds active all four quadrants,    no masses, no organomegaly  Genitalia:    Deferred  Rectal:    Extremities:   Extremities normal, atraumatic, no cyanosis or edema  Pulses:   2+ and symmetric all extremities  Skin:   Skin color, texture, turgor normal, no rashes or lesions  Lymph nodes:   Cervical, supraclavicular, and axillary nodes normal  Neurologic:   CNII-XII intact, normal strength, sensation and reflexes    throughout          Assessment & Plan:

## 2023-08-24 LAB — LIPID PANEL
Cholesterol: 127 mg/dL (ref 0–200)
HDL: 45.9 mg/dL (ref >39.00)
LDL Cholesterol: 59 mg/dL (ref 0–99)
NonHDL: 81.56
Total CHOL/HDL Ratio: 3
Triglycerides: 111 mg/dL (ref 0.0–149.0)
VLDL: 22.2 mg/dL (ref 0.0–40.0)

## 2023-08-24 LAB — CBC WITH DIFFERENTIAL/PLATELET
Basophils Absolute: 0.1 10*3/uL (ref 0.0–0.1)
Basophils Relative: 0.8 % (ref 0.0–3.0)
Eosinophils Absolute: 0.2 10*3/uL (ref 0.0–0.7)
Eosinophils Relative: 1.7 % (ref 0.0–5.0)
HCT: 44.3 % (ref 36.0–46.0)
Hemoglobin: 14.5 g/dL (ref 12.0–15.0)
Lymphocytes Relative: 22.3 % (ref 12.0–46.0)
Lymphs Abs: 2.3 10*3/uL (ref 0.7–4.0)
MCHC: 32.7 g/dL (ref 30.0–36.0)
MCV: 93.8 fL (ref 78.0–100.0)
Monocytes Absolute: 1 10*3/uL (ref 0.1–1.0)
Monocytes Relative: 9.3 % (ref 3.0–12.0)
Neutro Abs: 6.9 10*3/uL (ref 1.4–7.7)
Neutrophils Relative %: 65.9 % (ref 43.0–77.0)
Platelets: 295 10*3/uL (ref 150.0–400.0)
RBC: 4.73 Mil/uL (ref 3.87–5.11)
RDW: 13.4 % (ref 11.5–15.5)
WBC: 10.5 10*3/uL (ref 4.0–10.5)

## 2023-08-24 LAB — TSH: TSH: 0.94 u[IU]/mL (ref 0.35–5.50)

## 2023-08-24 LAB — HEPATIC FUNCTION PANEL
ALT: 14 U/L (ref 0–35)
AST: 17 U/L (ref 0–37)
Albumin: 3.9 g/dL (ref 3.5–5.2)
Alkaline Phosphatase: 92 U/L (ref 39–117)
Bilirubin, Direct: 0.1 mg/dL (ref 0.0–0.3)
Total Bilirubin: 0.4 mg/dL (ref 0.2–1.2)
Total Protein: 6.8 g/dL (ref 6.0–8.3)

## 2023-08-24 LAB — BASIC METABOLIC PANEL
BUN: 29 mg/dL — ABNORMAL HIGH (ref 6–23)
CO2: 29 meq/L (ref 19–32)
Calcium: 9.2 mg/dL (ref 8.4–10.5)
Chloride: 104 meq/L (ref 96–112)
Creatinine, Ser: 0.97 mg/dL (ref 0.40–1.20)
GFR: 53.56 mL/min — ABNORMAL LOW (ref >60.00)
Glucose, Bld: 65 mg/dL — ABNORMAL LOW (ref 70–99)
Potassium: 5.2 meq/L — ABNORMAL HIGH (ref 3.5–5.1)
Sodium: 139 meq/L (ref 135–145)

## 2023-08-24 LAB — VITAMIN B12: Vitamin B-12: 268 pg/mL (ref 211–911)

## 2023-08-24 LAB — VITAMIN D 25 HYDROXY (VIT D DEFICIENCY, FRACTURES): VITD: 64.75 ng/mL (ref 30.00–100.00)

## 2023-08-25 ENCOUNTER — Encounter: Payer: Self-pay | Admitting: Family Medicine

## 2023-08-25 ENCOUNTER — Telehealth: Payer: Self-pay

## 2023-08-25 NOTE — Telephone Encounter (Signed)
-----   Message from Neena Rhymes sent at 08/25/2023  7:35 AM EST ----- Labs are stable and look good!  No changes at this time

## 2023-08-27 DIAGNOSIS — H26491 Other secondary cataract, right eye: Secondary | ICD-10-CM | POA: Diagnosis not present

## 2023-08-27 DIAGNOSIS — Z961 Presence of intraocular lens: Secondary | ICD-10-CM | POA: Diagnosis not present

## 2023-09-06 ENCOUNTER — Encounter (HOSPITAL_BASED_OUTPATIENT_CLINIC_OR_DEPARTMENT_OTHER): Payer: Self-pay | Admitting: Pulmonary Disease

## 2023-09-15 ENCOUNTER — Other Ambulatory Visit: Payer: Self-pay | Admitting: Podiatry

## 2023-09-27 ENCOUNTER — Ambulatory Visit: Payer: Medicare Other | Admitting: Podiatry

## 2023-09-27 DIAGNOSIS — M79674 Pain in right toe(s): Secondary | ICD-10-CM

## 2023-09-27 DIAGNOSIS — B351 Tinea unguium: Secondary | ICD-10-CM | POA: Diagnosis not present

## 2023-09-27 DIAGNOSIS — G629 Polyneuropathy, unspecified: Secondary | ICD-10-CM

## 2023-09-27 DIAGNOSIS — M7752 Other enthesopathy of left foot: Secondary | ICD-10-CM | POA: Diagnosis not present

## 2023-09-27 DIAGNOSIS — M79675 Pain in left toe(s): Secondary | ICD-10-CM

## 2023-09-27 DIAGNOSIS — Q828 Other specified congenital malformations of skin: Secondary | ICD-10-CM

## 2023-09-27 NOTE — Patient Instructions (Signed)
While at your visit today you received a steroid injection in your foot or ankle to help with your pain. Along with having the steroid medication there is some "numbing" medication in the shot that you received. Due to this you may notice some numbness to the area for the next couple of hours.  ? ?I would recommend limiting activity for the next few days to help the steroid injection take affect.  ?  ?The actually benefit from the steroid injection may take up to 2-7 days to see a difference. You may actually experience a small (as in 10%) INCREASE in pain in the first 24 hours---that is common. It would be best if you can ice the area today and take anti-inflammatory medications (such as Ibuprofen, Motrin, or Aleve) if you are able to take these medications. If you were prescribed another medication to help with the pain go ahead and start that medication today  ?  ?Things to watch out for that you should contact us or a health care provider urgently would include: ?1. Unusual (as in more than 10%) increase in pain ?2. New fever > 101.5 ?3. New swelling or redness of the injected area.  ?4. Streaking of red lines around the area injected. ? ?If you have any questions or concerns about this, please give our office a call at [phone].  ? ? ?

## 2023-09-27 NOTE — Progress Notes (Signed)
Subjective: Chief Complaint  Patient presents with   Callouses    RM#11 Left foot callus shaving.      82 year old female presents the office today for concerns of calluses to her left foot that is causing pain.  Particular the left side she has pain about the lateral aspect.  She went shopping over the weekend and use a wheelchair because of the discomfort.  She also needs her nails trimmed dystrophic and elongated she cannot do them herself.  States her ankle been feeling better since the injection.  No open lesions.  No other concerns.  Objective: AAO x3, NAD DP/PT pulses palpable bilaterally, CRT less than 3 seconds Hyperkeratotic lesions left foot submetatarsal 1, 5 without any underlying ulceration drainage or signs of infection.  No edema, erythema.  Prominent metatarsal heads as well as along the fifth MTPJ Nails are mildly hypertrophic, dystrophic and elongated causing discomfort x 10.  No edema, erythema or signs of infection. No pain with calf compression, swelling, warmth, erythema  Assessment: Hyperkeratotic lesions left foot; neuropathy  Plan: -All treatment options discussed with the patient including all alternatives, risks, complications.  -Sharply debrided nails x 10 without any complications or bleeding -Serpe debrided hyperkeratotic lesion x 2 without any complications or bleeding. -Steroid injection performed submetatarsal 5 left foot.  Discussed risks and verbal consent obtained.  There is still present on alcohol.  A mixture of 0.5 cc of cefazolin phosphate, 0.5 cc of Marcaine plain was infiltrated in the area of tenderness on the fifth MPJ without complications.  Postinjection care discussed.  Tolerated well. -Sharply debrided the hyperkeratotic lesions x2 without any complications or bleeding. -Discussed gabapentin increase it to twice a day.  Monitoring side effects. -Moisturizer, offloading. -Patient encouraged to call the office with any questions, concerns,  change in symptoms.   Jenny [name] DPM

## 2023-10-07 ENCOUNTER — Telehealth: Payer: Self-pay | Admitting: Pulmonary Disease

## 2023-10-07 NOTE — Telephone Encounter (Signed)
Patient needs to change her insurance for Dupixent and needs the form. Call back number [phone]

## 2023-10-07 NOTE — Telephone Encounter (Signed)
Looks like Dupixent was d/c by PCP on 08/23/2023 for unclear reasons.  Patient states she is having insurance changes but needs to complete PAP renewal form for Dupixent. She will bring copy of temporary proof for new insurance. Please place renewal form up front for patient - she plans to come either Friday 12/27 or Monday 12/30 to complete form  Jenny [name], PharmD, MPH, BCPS, CPP Clinical Pharmacist (Rheumatology and Pulmonology)

## 2023-10-08 NOTE — Telephone Encounter (Addendum)
Renewal form placed up front by [facility] ([facility]). Please provider to patient to complete and return to PHARMACY TEAM MAILBOX with new insurance information

## 2023-10-15 ENCOUNTER — Other Ambulatory Visit (HOSPITAL_COMMUNITY): Payer: Self-pay

## 2023-10-15 ENCOUNTER — Encounter: Payer: Self-pay | Admitting: Orthopedic Surgery

## 2023-10-15 NOTE — Telephone Encounter (Signed)
 Signed portion received and placed in PAP Pending folder.  New insurance info populates in [facility], will continue with [facility].  Submitted a Prior Authorization request to [facility] ADVANTAGE/RX ADVANCE for DUPIXENT  via CoverMyMeds. Will update once we receive a response.  Key: [id number]

## 2023-10-19 ENCOUNTER — Other Ambulatory Visit: Payer: Self-pay | Admitting: Family Medicine

## 2023-10-19 ENCOUNTER — Other Ambulatory Visit: Payer: Self-pay | Admitting: Surgical

## 2023-10-19 MED ORDER — HYDROCODONE-ACETAMINOPHEN 5-325 MG PO TABS: 1.0000 | ORAL_TABLET | Freq: Every evening | ORAL | 0 refills | Status: DC | PRN

## 2023-10-24 ENCOUNTER — Encounter: Payer: Self-pay | Admitting: Family Medicine

## 2023-10-25 ENCOUNTER — Ambulatory Visit (INDEPENDENT_AMBULATORY_CARE_PROVIDER_SITE_OTHER): Payer: PPO | Admitting: Pulmonary Disease

## 2023-10-25 ENCOUNTER — Encounter (HOSPITAL_BASED_OUTPATIENT_CLINIC_OR_DEPARTMENT_OTHER): Payer: Self-pay | Admitting: Pulmonary Disease

## 2023-10-25 VITALS — BP 132/80 | HR 50 | Ht 64.0 in | Wt 212.0 lb

## 2023-10-25 DIAGNOSIS — G4733 Obstructive sleep apnea (adult) (pediatric): Secondary | ICD-10-CM | POA: Diagnosis not present

## 2023-10-25 DIAGNOSIS — J849 Interstitial pulmonary disease, unspecified: Secondary | ICD-10-CM

## 2023-10-25 DIAGNOSIS — R001 Bradycardia, unspecified: Secondary | ICD-10-CM

## 2023-10-25 DIAGNOSIS — J4550 Severe persistent asthma, uncomplicated: Secondary | ICD-10-CM

## 2023-10-25 DIAGNOSIS — J439 Emphysema, unspecified: Secondary | ICD-10-CM

## 2023-10-25 MED ORDER — ALPRAZOLAM 0.5 MG PO TABS: ORAL_TABLET | ORAL | 1 refills | Status: DC

## 2023-10-25 NOTE — Patient Instructions (Addendum)
X  Rx for replacement autoCPAP 5-12 cm to DME   X HRCT chest to follow up on fibrosis in lungs

## 2023-10-25 NOTE — Progress Notes (Signed)
 Subjective:    Patient ID: Jenny [name], female    DOB: 09/07/1941, 83 y.o.   MRN: [id number]  HPI  83 y.o. 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  She has a 45 pack-year smoking history  -prior pt of VS  Discussed the use of AI scribe software for clinical note transcription with the patient, who gave verbal consent to proceed.  History of Present Illness   The patient, with a history of sleep apnea, emphysema, and ILD, presents with extreme fatigue. She reports feeling drained and often goes to bed at 4 PM and doesn't wake up until 10 AM the next day. The patient has not been using her CPAP machine for a long time and is currently in the process of getting a new one. She also reports a history of smoking.  The patient's asthma is reportedly well-controlled with Dupixent , which she describes as a wonder drug. She also takes Symbicort . However, she reports that gabapentin , which was prescribed for leg pain, makes her stumble and she is considering discontinuing it.  The patient also mentions a recent episode where her heart rate was found to be low during a visit to her primary care doctor, leading to a hospital admission. 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 She wore a heart monitor for 14 days - no sustained bradycardia  and had a heart echo. The patient also reports a problem with her shoulder, for which she receives gel injections every four months.     Heart rate 50 today and alternate pulse beats seems to be stronger. She desaturates to 90% on ambulation and recovers with resting   Significant tests/ events reviewed  Spirometry 04/21/18 >> FEV1 1.3 (64%), FEV1% 83 IgE 01/30/20 >> 40, Absolute eosinophils 600 PFT 04/17/20 >> FEV1 1.30 (65%), FEV1% 79, TLC 6.97 (133%), RV 5.11 (207%), DLCO 88% Serology 04/17/20 >> ANA, HP panel, anti CCP negative; RF 25  Imaging  : HRCT chest 02/09/20 >> atherosclerosis, 3 mm nodule LLL, mild patchy subpleural reticulation and GGO b/l with mild basilar predominance HRCT chest 02/04/21 >> centrilobular and paraseptal emphysema; mild peripheral and basilar predominant subpleural reticulation and GGO slight increase from April 2021, minimal traction BTX (probable UIP pattern)  Sleep : PSG April 2008 >> AHI 29, SpO2 low 62%   Review of Systems neg for any significant sore throat, dysphagia, itching, sneezing, nasal congestion or excess/ purulent secretions, fever, chills, sweats, unintended wt loss, pleuritic or exertional cp, hempoptysis, orthopnea pnd or change in chronic leg swelling. Also denies presyncope, palpitations, heartburn, abdominal pain, nausea, vomiting, diarrhea or change in bowel or urinary habits, dysuria,hematuria, rash, arthralgias, visual complaints, headache, numbness weakness or ataxia.     Objective:   Physical Exam  Gen. 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  She does have untreated OSA and we will initiate treatment for this and also refer her for cardiac reevaluation    Obstructive Sleep Apnea Reports extreme fatigue and non-compliance with CPAP therapy due to insurance and prescription issues. Discussed the importance of CPAP therapy in reducing  fatigue and preventing cardiovascular complications. - Send prescription for new autoCPAP 5-12 machine - Follow up in 2-3 months to assess effectiveness of CPAP therapy  Emphysema Symptoms improved with Dupixent  and Symbicort . Discussed continued use to manage symptoms and prevent exacerbations. - Continue Dupixent  - Continue Symbicort   Interstitial Lung Disease (Probable UIP Pattern) Last scan in 2022 showed probable UIP pattern. Reports mild symptoms and occasional chest discomfort. Discussed need for repeat imaging to monitor disease progression. - Order repeat HR chest CT to assess current status of ILD  Bradycardia Previous significant bradycardia led to hospitalization. Cardiologist determined no need for a pacemaker. Reports persistent fatigue. Discussed potential causes, including bradycardia and other cardiovascular issues. Importance of reviewing previous cardiac evaluations emphasized. 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 Discussed addressing deficiencies to improve health and energy levels. - Review and address any deficiencies in B12 and vitamin D  levels  Follow-up - Follow up in 2-3 months for CPAP therapy assessment - Review heart rate and oxygen levels today and determine if cardiology referral is needed - Discuss findings and next steps before patient leaves today.      Addendum -EKG obtained today shows normal sinus rhythm with PVC

## 2023-10-28 ENCOUNTER — Encounter (HOSPITAL_BASED_OUTPATIENT_CLINIC_OR_DEPARTMENT_OTHER): Payer: Self-pay | Admitting: Pulmonary Disease

## 2023-10-28 DIAGNOSIS — J4550 Severe persistent asthma, uncomplicated: Secondary | ICD-10-CM

## 2023-11-02 NOTE — Telephone Encounter (Signed)
Received a fax regarding Prior Authorization from [facility] ADVANTAGE/RX ADVANCE for DUPIXENT. Authorization has been DENIED.  Submitted an URGENT appeal to [facility] ADVANTAGE/RX ADVANCE for DUPIXENT.  Phone: [phone] Fax: [phone]  Chesley Mires, PharmD, MPH, BCPS, CPP Clinical Pharmacist (Rheumatology and Pulmonology)

## 2023-11-03 ENCOUNTER — Other Ambulatory Visit (HOSPITAL_COMMUNITY): Payer: Self-pay

## 2023-11-03 NOTE — Telephone Encounter (Signed)
Patient enrolled into asthma grant through [employer]    Unfortunately this means she'll only be able to get one fill under this grant as the max OOP is $2000. She'd likely have $500 copay once grant funds are depleted  We will submit PAP renewal through [employer].  Patient has 2 doses of Dupixent at home (will use last dose on 11/21/2023).  Chesley Mires, PharmD, MPH, BCPS, CPP Clinical Pharmacist (Rheumatology and Pulmonology)

## 2023-11-03 NOTE — Telephone Encounter (Signed)
Received notification from [facility] ADVANTAGE/RX ADVANCE regarding a prior authorization for DUPIXENT. Authorization has been APPROVED from 11/03/2023 to 05/01/2024. Approval letter sent to scan center.  Per test claim, copay for 28 days supply is $$1,263.60  Patient can fill through [facility]: [phone]   Authorization # [id number]

## 2023-11-05 NOTE — Telephone Encounter (Signed)
Submitted Patient Assistance Application to [employer] for DUPIXENT along with provider portion, patient portion, PA, medication list, and insurance card copy. Will update patient when we receive a response.  Phone #: [phone] Fax #: [phone]

## 2023-11-18 MED ORDER — DUPIXENT 300 MG/2ML ~~LOC~~ SOAJ: 300.0000 mg | SUBCUTANEOUS | 1 refills | Status: DC

## 2023-11-19 ENCOUNTER — Ambulatory Visit: Payer: Self-pay | Admitting: Adult Health

## 2023-11-23 ENCOUNTER — Ambulatory Visit (HOSPITAL_BASED_OUTPATIENT_CLINIC_OR_DEPARTMENT_OTHER)
Admission: RE | Admit: 2023-11-23 | Discharge: 2023-11-23 | Disposition: A | Discharge status: Home / Self Care | Payer: HMO | Source: Ambulatory Visit | Attending: Pulmonary Disease | Admitting: Pulmonary Disease

## 2023-11-23 DIAGNOSIS — J849 Interstitial pulmonary disease, unspecified: Secondary | ICD-10-CM | POA: Insufficient documentation

## 2023-11-23 DIAGNOSIS — R918 Other nonspecific abnormal finding of lung field: Secondary | ICD-10-CM | POA: Diagnosis not present

## 2023-11-26 ENCOUNTER — Ambulatory Visit: Payer: HMO | Admitting: Adult Health

## 2023-11-29 ENCOUNTER — Ambulatory Visit: Payer: HMO | Admitting: Podiatry

## 2023-11-29 ENCOUNTER — Encounter: Payer: Self-pay | Admitting: Podiatry

## 2023-11-29 DIAGNOSIS — G629 Polyneuropathy, unspecified: Secondary | ICD-10-CM

## 2023-11-29 DIAGNOSIS — M79674 Pain in right toe(s): Secondary | ICD-10-CM | POA: Diagnosis not present

## 2023-11-29 DIAGNOSIS — B351 Tinea unguium: Secondary | ICD-10-CM

## 2023-11-29 DIAGNOSIS — M79675 Pain in left toe(s): Secondary | ICD-10-CM

## 2023-11-29 DIAGNOSIS — Q828 Other specified congenital malformations of skin: Secondary | ICD-10-CM

## 2023-11-29 NOTE — Progress Notes (Signed)
Subjective: Chief Complaint  Patient presents with   RFC    RM#13 RFC/Calluses      83 year old female presents the office today for concerns of calluses to her left foot that is causing pain as well as for thick, elongated nails that she is not able to trim her self.  She said the injection last time helped a lot.  Objective: AAO x3, NAD DP/PT pulses palpable bilaterally, CRT less than 3 seconds Hyperkeratotic lesions left foot submetatarsal 1, 5 without any underlying ulceration drainage or signs of infection.  No edema, erythema.  Prominent metatarsal heads as well as along the fifth MTPJ Nails are hypertrophic, dystrophic and elongated causing discomfort x 10.  Tenderness nails 1-5 bilaterally.  No edema, erythema or signs of infection. No pain with calf compression, swelling, warmth, erythema  Assessment: Hyperkeratotic lesions left foot; symptomatic onychosis  Plan: -All treatment options discussed with the patient including all alternatives, risks, complications.  -Sharply debrided nails x 10 without any complications or bleeding -Serpe debrided hyperkeratotic lesion x 2 without any complications or bleeding.  Continue moisturizer, offloading.  Return in about 3 months (around 02/26/2024).  Vivi Barrack DPM

## 2023-12-01 NOTE — Progress Notes (Deleted)
 Cardiology Office Note    Date:  12/01/2023  ID:  Jenny [name] 06/07/1941, MRN [id number] PCP:  Sheliah Hatch, MD  Cardiologist:  None  Electrophysiologist:  None   Chief Complaint: ***  History of Present Illness: .    Jenny [name] is a 83 y.o. female with visit-pertinent history of hypertension, hypothyroidism, ventricular bigeminy emphysema, OSA, interstitial lung disease, bradycardia.  First seen by Dr. Antoine Poche in 04/02/2022 for evaluation of fatigue, weakness and bradycardia while in the hospital.  Patient had an echo in 03/2017 with normal LV function, no RWMA.  At that time she reported 2 to 3 months history of progressively worsening weakness, also had some spells where her weakness exacerbated with dizziness for a few minutes with spontaneous resolution.  She reported progressive worsening exertional shortness of breath.  That morning she had presented to her PCPs office and was noted to be bradycardic in the 30s on pulse ox.  She was found to have ventricular bigeminy with sinus rhythm on EKG by EMS.   Echocardiogram on 04/27/2022 indicated LVEF of 65 to 70%, LV with normal function, no RWMA, diastolic parameters were normal.  RV systolic function was normal, normal pulmonary artery systolic pressures, trivial mitral valve regurgitation, aortic valve was tricuspid without regurgitation.  Patient's cardiac monitor showed a minimum heart rate of 57 bpm, max heart rate of 190 bpm and average heart rate of 73 bpm.  Predominant underlying rhythm was sinus rhythm.  She has 6 runs of supraventricular tachycardia, run with the fastest interval lasting 4 beats with a max rate of 190 bpm, longest lasting 8 beats with an average rate of 117 bpm.  Idioventricular rhythm was present.  Isolated VE's were occasional at 2.4%, VE couplets were rare at less than 1% and VE triplets were rare at less than 1%.  She was last seen in clinic by Dr. Antoine Poche on 05/22/2022.  She reported that she  was feeling fine, she denied any palpitations except for rare skipping beats.  She denied any presyncope or syncope.  She denied any shortness of breath or chest pressure.  Today she presents for follow up regarding ongoing fatigue. She reports that she   Fatigue/bradycardia/ventricular ectopy: Cardiac monitor in 2023 showed an average heart rate of 73 bpm ranging from 57 to 190 bpm.  She had 6 runs of supraventricular tachycardia.  It did show evidence of ventricular bigeminy.  Today she reports  Palpitations  OSA: Patient with history of sleep apnea  Hypertension: Blood pressure today  Labwork independently reviewed:   ROS: .    Please see the history of present illness. Otherwise, review of systems is positive for ***.  All other systems are reviewed and otherwise negative.  Studies Reviewed: Marland Kitchen    EKG:  EKG is ordered today, personally reviewed, demonstrating ***  CV Studies: Cardiac studies reviewed are outlined and summarized above. Otherwise please see EMR for full report.   Current Reported Medications:.    No outpatient medications have been marked as taking for the 12/03/23 encounter (Appointment) with Rip Harbour, NP.   Current Facility-Administered Medications for the 12/03/23 encounter (Appointment) with Reather Littler D, NP  Medication   cyanocobalamin (VITAMIN B12) injection 1,000 mcg    Physical Exam:    VS:  There were no vitals taken for this visit.   Wt Readings from Last 3 Encounters:  10/25/23 212 lb (96.2 kg)  08/23/23 209 lb 3.2 oz (94.9 kg)  07/19/23 209 lb (  94.8 kg)    GEN: Well nourished, well developed in no acute distress NECK: No JVD; No carotid bruits CARDIAC: ***RRR, no murmurs, rubs, gallops RESPIRATORY:  Clear to auscultation without rales, wheezing or rhonchi  ABDOMEN: Soft, non-tender, non-distended EXTREMITIES:  No edema; No acute deformity   Asessement and Plan:.     ***     Disposition: F/u with ***  Signed, Rip Harbour, NP

## 2023-12-03 ENCOUNTER — Ambulatory Visit: Payer: HMO | Attending: Cardiology | Admitting: Cardiology

## 2023-12-03 DIAGNOSIS — R5383 Other fatigue: Secondary | ICD-10-CM

## 2023-12-03 DIAGNOSIS — I498 Other specified cardiac arrhythmias: Secondary | ICD-10-CM

## 2023-12-03 DIAGNOSIS — R531 Weakness: Secondary | ICD-10-CM

## 2023-12-03 DIAGNOSIS — R001 Bradycardia, unspecified: Secondary | ICD-10-CM

## 2023-12-06 ENCOUNTER — Telehealth: Payer: Self-pay

## 2023-12-06 ENCOUNTER — Encounter (HOSPITAL_BASED_OUTPATIENT_CLINIC_OR_DEPARTMENT_OTHER): Payer: Self-pay | Admitting: Pulmonary Disease

## 2023-12-06 NOTE — Telephone Encounter (Signed)
 VOB has been submitted for Monovisc, left knee  Talked with patient and appointment has been scheduled.

## 2023-12-07 NOTE — Progress Notes (Signed)
 Pt.notified

## 2023-12-07 NOTE — Progress Notes (Unsigned)
 Cardiology Office Note   Date:  12/08/2023  ID:  Jenny [name], Jenny [name] 10/05/1941, MRN [id number] PCP:  Sheliah Hatch, MD [facility] Cardiologist: Rollene Rotunda, MD  Reason for visit: Bradycardia  History of Present Illness    Jenny [name] is a 83 y.o. female with a hx of hypertension, hypothyroidism, asthma, OSA, ILD.  She was last seen by Dr. Antoine Poche in August 2023.  She was seen in the ED in June 2023 for fatigue, weakness of bradycardia.  She had ventricular bigeminy with no sustained arrhythmias.  Monitor showed no sustained bradycardia.  Echo unremarkable.  She saw [facility] health pulmonology on October 25, 2023.  Dr. Vassie Loll mentioned patient's main complaint was excessive fatigue.  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  Patient was non-compliant with CPAP -new auto CPAP machine was prescribed.  Today, patient states she is feeling much better.  She is not sure what her intermittent fatigue is due to.  She states when her son that lived with her died from overdose 3 years ago she had hard time getting through that.  She is now on treatment for low vitamin D and vitamin B12.  She is waiting for her new CPAP machine to come in.  She states fatigue could also be secondary to her chronic back pain.  She sits with dementia patients and has her daughter, son-in-law and their 72-year-old daughter with cerebral palsy living with her.  She mentions some daily stress.    She states she follows her heart rate when she checks her oxygen saturation.  Her heart rate has " settled down."   She denies chest pain.  She has chronic dyspnea on moderate exertion with her pulmonary fibrosis.  She walks [employer] for exercise.  She mentions she had increased lower extremity edema a couple days ago with associated bloating.  She took Lasix and wore compression stockings which helped.  She states her dry weight  is around 205 to 209 pounds.     Objective / Physical Exam   Vital signs:  BP 132/72 (BP Location: Left Arm, Patient Position: Sitting, Cuff Size: Normal)   Ht 5\' 4"  (1.626 m)   Wt 209 lb 12.8 oz (95.2 kg)   SpO2 94%   BMI 36.01 kg/m     GEN: No acute distress NECK: No carotid bruits CARDIAC: RRR, no murmurs RESPIRATORY:  Clear to auscultation without rales, wheezing or rhonchi  EXTREMITIES: Trace edema  Reviewed data: EKG October 25, 2023:  normal sinus rhythm with occasional PVC with heart rate 76. EKG June 2023: Ventricular bigeminy, heart rate 83. EKG December 2021: Normal sinus rhythm, heart rate 97 EKG July 2021: Normal sinus rhythm, heart rate 73.  Review vital signs flowsheets: Heart rate typically in the 70s at her appointments in 2024.  Assessment and Plan   Fatigue, improved -Likely multifactorial.  She states fatigue is improved.  Continue treatment for vitamin D/B12 deficiency.  Start CPAP when machine arrives.  -Recommend sleep hygiene, continue regular activity.  PVCs Ventricular bigeminy -Event monitor in 2023 with normal sinus rhythm, rare ventricular bigeminy, occasional isolated PVCs, no sustained arrhythmias. -RRR today.  Baseline heart rate in the 70s.  Not on AV nodal blocking agents. -Since fatigue improved, do not see need for a repeat monitor.  When she is in ventricular bigeminy, it is difficult for offices to palpate accurate heart rate/ her pulse oximeter to read accurately.  Hypertension, well-controlled -Continue amlodipine 5 mg daily. -Goal BP is <130/80.  Recommend DASH diet (high in vegetables, fruits, low-fat dairy products, whole grains, poultry, fish, and nuts and low in sweets, sugar-sweetened beverages, and red meats), salt restriction and increase physical activity.  Bilateral lower extremity edema, nearly resolved -Continue Lasix 20 mg as needed. -Continue compression stockings. -We reviewed signs of fluid retention including  lower extremity edema, bloating and following weight trends.  Disposition - Follow-up in 1 year.   Signed, Cannon Kettle, PA-C  12/08/2023 [facility]

## 2023-12-08 ENCOUNTER — Ambulatory Visit: Payer: HMO | Attending: Physician Assistant | Admitting: Physician Assistant

## 2023-12-08 ENCOUNTER — Encounter: Payer: Self-pay | Admitting: Physician Assistant

## 2023-12-08 VITALS — BP 132/72 | Ht 64.0 in | Wt 209.8 lb

## 2023-12-08 DIAGNOSIS — R5383 Other fatigue: Secondary | ICD-10-CM | POA: Diagnosis not present

## 2023-12-08 DIAGNOSIS — I493 Ventricular premature depolarization: Secondary | ICD-10-CM

## 2023-12-08 DIAGNOSIS — I498 Other specified cardiac arrhythmias: Secondary | ICD-10-CM | POA: Diagnosis not present

## 2023-12-08 NOTE — Patient Instructions (Signed)
Medication Instructions:  No Changes *If you need a refill on your cardiac medications before your next appointment, please call your pharmacy*   Lab Work: No Labs If you have labs (blood work) drawn today and your tests are completely normal, you will receive your results only by: MyChart Message (if you have MyChart) OR A paper copy in the mail If you have any lab test that is abnormal or we need to change your treatment, we will call you to review the results.   Testing/Procedures: No Testing   Follow-Up: At [facility], you and your health needs are our priority.  As part of our continuing mission to provide you with exceptional heart care, we have created designated Provider Care Teams.  These Care Teams include your primary Cardiologist (physician) and Advanced Practice Providers (APPs -  Physician Assistants and Nurse Practitioners) who all work together to provide you with the care you need, when you need it.  We recommend signing up for the patient portal called "MyChart".  Sign up information is provided on this After Visit Summary.  MyChart is used to connect with patients for Virtual Visits (Telemedicine).  Patients are able to view lab/test results, encounter notes, upcoming appointments, etc.  Non-urgent messages can be sent to your provider as well.   To learn more about what you can do with MyChart, go to [email or web address].    Your next appointment:   1 year(s)  Provider:   Rollene Rotunda, MD

## 2023-12-13 ENCOUNTER — Other Ambulatory Visit: Payer: Self-pay

## 2023-12-13 ENCOUNTER — Other Ambulatory Visit: Payer: Self-pay | Admitting: Podiatry

## 2023-12-13 DIAGNOSIS — M1712 Unilateral primary osteoarthritis, left knee: Secondary | ICD-10-CM

## 2023-12-15 ENCOUNTER — Other Ambulatory Visit: Payer: Self-pay

## 2023-12-15 ENCOUNTER — Ambulatory Visit: Payer: HMO | Admitting: Surgical

## 2023-12-15 ENCOUNTER — Encounter: Payer: Self-pay | Admitting: Surgical

## 2023-12-15 DIAGNOSIS — M1712 Unilateral primary osteoarthritis, left knee: Secondary | ICD-10-CM

## 2023-12-15 DIAGNOSIS — M19012 Primary osteoarthritis, left shoulder: Secondary | ICD-10-CM | POA: Diagnosis not present

## 2023-12-15 DIAGNOSIS — M542 Cervicalgia: Secondary | ICD-10-CM

## 2023-12-15 MED ORDER — METHYLPREDNISOLONE ACETATE 40 MG/ML IJ SUSP
40.0000 mg | INTRAMUSCULAR | Status: AC | PRN
Start: 2023-12-15 — End: 2023-12-15
  Administered 2023-12-15: 40 mg via INTRA_ARTICULAR

## 2023-12-15 MED ORDER — LIDOCAINE HCL 1 % IJ SOLN
5.0000 mL | INTRAMUSCULAR | Status: AC | PRN
  Administered 2023-12-15: 5 mL

## 2023-12-15 MED ORDER — BUPIVACAINE HCL 0.25 % IJ SOLN
9.0000 mL | INTRAMUSCULAR | Status: AC | PRN
  Administered 2023-12-15: 9 mL via INTRA_ARTICULAR

## 2023-12-15 MED ORDER — HYALURONAN 88 MG/4ML IX SOSY
88.0000 mg | PREFILLED_SYRINGE | INTRA_ARTICULAR | Status: AC | PRN
  Administered 2023-12-15: 88 mg via INTRA_ARTICULAR

## 2023-12-15 NOTE — Progress Notes (Signed)
   Procedure Note  Patient: Jenny [name]             Date of Birth: 04/20/1941           MRN: [id number]             Visit Date: 12/15/2023  Procedures: Visit Diagnoses:  1. Arthritis of left shoulder region     Large Joint Inj: L glenohumeral on 12/15/2023 5:03 PM Details: 22 G 3.5 in needle, ultrasound-guided posterior approach Medications: 5 mL lidocaine 1 %; 9 mL bupivacaine 0.25 %; 40 mg methylPREDNISolone acetate 40 MG/ML Outcome: tolerated well, no immediate complications  Glenohumeral injection administered today.  We will see how this does for her and also set up cervical spine ESI based on her prior cervical spine MRI which shows left-sided foraminal stenosis to see which injection is more helpful for her. Procedure, treatment alternatives, risks and benefits explained, specific risks discussed. Consent was given by the patient. Patient was prepped and draped in the usual sterile fashion.    Large Joint Inj: L knee on 12/15/2023 5:03 PM Indications: diagnostic evaluation, joint swelling and pain Details: 18 G 1.5 in needle, superolateral approach  Arthrogram: No  Medications: 5 mL lidocaine 1 %; 88 mg Hyaluronan 88 MG/4ML Outcome: tolerated well, no immediate complications Procedure, treatment alternatives, risks and benefits explained, specific risks discussed. Consent was given by the patient. Immediately prior to procedure a time out was called to verify the correct patient, procedure, equipment, support staff and site/side marked as required. Patient was prepped and draped in the usual sterile fashion.

## 2023-12-21 ENCOUNTER — Encounter (HOSPITAL_BASED_OUTPATIENT_CLINIC_OR_DEPARTMENT_OTHER): Payer: Self-pay | Admitting: Pulmonary Disease

## 2023-12-21 NOTE — Telephone Encounter (Signed)
 Can we check on pricing for patient?

## 2023-12-27 ENCOUNTER — Other Ambulatory Visit (HOSPITAL_COMMUNITY): Payer: Self-pay

## 2023-12-27 ENCOUNTER — Other Ambulatory Visit: Payer: Self-pay

## 2023-12-27 ENCOUNTER — Other Ambulatory Visit: Payer: Self-pay | Admitting: Pharmacist

## 2023-12-27 DIAGNOSIS — J4550 Severe persistent asthma, uncomplicated: Secondary | ICD-10-CM

## 2023-12-27 MED ORDER — DUPIXENT 300 MG/2ML ~~LOC~~ SOAJ
300.0000 mg | SUBCUTANEOUS | 1 refills | Status: DC
  Filled 2023-12-27: qty 4, 28d supply, fill #0
  Filled 2024-01-18: qty 4, 28d supply, fill #1
  Filled 2024-02-08: qty 4, 28d supply, fill #2
  Filled 2024-03-13: qty 4, 28d supply, fill #3
  Filled 2024-04-13: qty 4, 28d supply, fill #4
  Filled 2024-05-10 – 2024-06-05 (×3): qty 4, 28d supply, fill #5

## 2023-12-27 NOTE — Telephone Encounter (Signed)
 Noted.

## 2023-12-27 NOTE — Progress Notes (Signed)
 Rx for Dupixent 300mg  subcut sent to [facility]  Chesley Mires, PharmD, MPH, BCPS, CPP Clinical Pharmacist (Rheumatology and Pulmonology)

## 2023-12-27 NOTE — Telephone Encounter (Signed)
 Spoke with pt regarding grant that [facility] had previously enrolled her in. Advised that the total amount was only for $1,500 and that would cover the initial month and that she would likely have a remaining copay for the following month. She is in agreement with plan to move forward and fill with [facility] and "cross that bridge when we get to it".   Pt could potentially enroll into MPPP after this first fill to be able to better manage future copays after the fact.  Test claim for 84-day supply: $1,978.69 (likely the remaining amount of her maximum OOP for 2025) Test claim for 28-day supply: $1,263.60 Theoretical unmet max OOP amount after this fill: $715.09 Potential monthly payment if enrolled into MPPP in March (9 months) after this fill: $79.45

## 2023-12-27 NOTE — Progress Notes (Signed)
 Specialty Pharmacy Initial Fill Coordination Note  Jenny [name] is a 83 y.o. female contacted today regarding initial fill of specialty medication(s) Dupilumab (Dupixent)   Patient requested Delivery   Delivery date: 12/30/23   Verified address: [address]   [place] [address]   Medication will be filled on 12/29/23.   Patient has grant info on file and is aware of $0 copayment.

## 2023-12-28 ENCOUNTER — Ambulatory Visit: Payer: Self-pay

## 2023-12-28 ENCOUNTER — Ambulatory Visit: Admitting: Physical Medicine and Rehabilitation

## 2023-12-28 VITALS — BP 149/89 | HR 82

## 2023-12-28 DIAGNOSIS — M5412 Radiculopathy, cervical region: Secondary | ICD-10-CM

## 2023-12-28 DIAGNOSIS — Z981 Arthrodesis status: Secondary | ICD-10-CM

## 2023-12-28 DIAGNOSIS — M961 Postlaminectomy syndrome, not elsewhere classified: Secondary | ICD-10-CM

## 2023-12-28 MED ORDER — METHYLPREDNISOLONE ACETATE 40 MG/ML IJ SUSP
40.0000 mg | Freq: Once | INTRAMUSCULAR | Status: AC
  Administered 2023-12-28: 40 mg

## 2023-12-28 NOTE — Progress Notes (Signed)
 Pain Scale   Average Pain 3        +Driver, -BT, -Dye Allergies.

## 2023-12-28 NOTE — Patient Instructions (Signed)

## 2023-12-29 ENCOUNTER — Other Ambulatory Visit: Payer: Self-pay

## 2023-12-31 NOTE — Progress Notes (Signed)
 Patient running into issues with Dupixent coverage. Grant billed to get to $2000 OOP max and leave pt with more affordable monthly cost for Medicare Rx Payment Plan  Chesley Mires, PharmD, MPH, BCPS, CPP Clinical Pharmacist (Rheumatology and Pulmonology)

## 2024-01-05 DIAGNOSIS — Z981 Arthrodesis status: Secondary | ICD-10-CM | POA: Insufficient documentation

## 2024-01-05 NOTE — Procedures (Signed)
 Cervical Epidural Steroid Injection - Interlaminar Approach with Fluoroscopic Guidance  Patient: Jenny [name]      Date of Birth: May 01, 1941 MRN: [id number] PCP: Sheliah Hatch, MD      Visit Date: 12/28/2023   Universal Protocol:    Date/Time: 01/04/2509:22 AM  Consent Given By: the patient  Position: PRONE  Additional Comments: Vital signs were monitored before and after the procedure. Patient was prepped and draped in the usual sterile fashion. The correct patient, procedure, and site was verified.   Injection Procedure Details:   Procedure diagnoses: Cervical radiculopathy [M54.12]    Meds Administered:  Meds ordered this encounter  Medications   methylPREDNISolone acetate (DEPO-MEDROL) injection 40 mg     Laterality: Left  Location/Site: C7-T1  Needle: 3.5 in., 20 ga. Tuohy  Needle Placement: Paramedian epidural space  Findings:  -Comments: Excellent flow of contrast into the epidural space.  Procedure Details: Using a paramedian approach from the side mentioned above, the region overlying the inferior lamina was localized under fluoroscopic visualization and the soft tissues overlying this structure were infiltrated with 4 ml. of 1% Lidocaine without Epinephrine. A # 20 gauge, Tuohy needle was inserted into the epidural space using a paramedian approach.  The epidural space was localized using loss of resistance along with contralateral oblique bi-planar fluoroscopic views.  After negative aspirate for air, blood, and CSF, a 2 ml. volume of Isovue-250 was injected into the epidural space and the flow of contrast was observed. Radiographs were obtained for documentation purposes.   The injectate was administered into the level noted above.  Additional Comments:  The patient tolerated the procedure well Dressing: 2 x 2 sterile gauze and Band-Aid    Post-procedure details: Patient was observed during the procedure. Post-procedure instructions were  reviewed.  Patient left the clinic in stable condition.

## 2024-01-05 NOTE — Progress Notes (Signed)
 Jenny [name] - 83 y.o. female MRN [id number]  Date of birth: [birth year]  Office Visit Note: Visit Date: 12/28/2023 PCP: Sheliah Hatch, MD Referred by: Sheliah Hatch, MD  Subjective: Chief Complaint  Patient presents with   Neck - Pain   HPI:  Jenny [name] is a 83 y.o. female who comes in today at the request of Karenann Cai, PA-C for planned Left C7-T1 Cervical Interlaminar epidural steroid injection with fluoroscopic guidance.  The patient has failed conservative care including home exercise, medications, time and activity modification.  This injection will be diagnostic and hopefully therapeutic.  Please see requesting physician notes for further details and justification.   ROS Otherwise per HPI.  Assessment & Plan: Visit Diagnoses:    ICD-10-CM   1. 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 S/P cervical spinal fusion  Z98.1       Plan: No additional findings.   Meds & Orders:  Meds ordered this encounter  Medications   methylPREDNISolone acetate (DEPO-MEDROL) injection 40 mg    Orders Placed This Encounter  Procedures   XR C-ARM NO REPORT   Epidural Steroid injection    Follow-up: Return if symptoms worsen or fail to improve.   Procedures: No procedures performed  Cervical Epidural Steroid Injection - Interlaminar Approach with Fluoroscopic Guidance  Patient: Jenny [name]      Date of Birth: 09/07/1941 MRN: [id number] PCP: Sheliah Hatch, MD      Visit Date: 12/28/2023   Universal Protocol:    Date/Time: 01/04/2509:22 AM  Consent Given By: the patient  Position: PRONE  Additional Comments: Vital signs were monitored before and after the procedure. Patient was prepped and draped in the usual sterile fashion. The correct patient, procedure, and site was verified.   Injection  Procedure Details:   Procedure diagnoses: Cervical radiculopathy [M54.12]    Meds Administered:  Meds ordered this encounter  Medications   methylPREDNISolone acetate (DEPO-MEDROL) injection 40 mg     Laterality: Left  Location/Site: C7-T1  Needle: 3.5 in., 20 ga. Tuohy  Needle Placement: Paramedian epidural space  Findings:  -Comments: Excellent flow of contrast into the epidural space.  Procedure Details: Using a paramedian approach from the side mentioned above, the region overlying the inferior lamina was localized under fluoroscopic visualization and the soft tissues overlying this structure were infiltrated with 4 ml. of 1% Lidocaine without Epinephrine. A # 20 gauge, Tuohy needle was inserted into the epidural space using a paramedian approach.  The epidural space was localized using loss of resistance along with contralateral oblique bi-planar fluoroscopic views.  After negative aspirate for air, blood, and CSF, a 2 ml. volume of Isovue-250 was injected into the epidural space and the flow of contrast was observed. Radiographs were obtained for documentation purposes.   The injectate was administered into the level noted above.  Additional Comments:  The patient tolerated the procedure well Dressing: 2 x 2 sterile gauze and Band-Aid    Post-procedure details: Patient was observed during the procedure. Post-procedure instructions were reviewed.  Patient left the clinic in stable condition.   Clinical History: Narrative & Impression CLINICAL DATA:  Cervical pain for 3-4 months.   EXAM: MRI CERVICAL SPINE WITHOUT CONTRAST   TECHNIQUE: Multiplanar, multisequence MR imaging of the cervical spine was performed. No intravenous contrast  was administered.   COMPARISON:  11/21/2017   FINDINGS: Alignment: Physiologic.   Vertebrae: No acute fracture, evidence of discitis, or aggressive bone lesion.   Cord: Focal area of T2 hyperintensity in the cervical spinal  cord at C4-5 with mild volume loss as can be seen with myelomalacia.   Posterior Fossa, vertebral arteries, paraspinal tissues: Posterior fossa demonstrates no focal abnormality. Vertebral artery flow voids are maintained. Paraspinal soft tissues are unremarkable.   Disc levels:   Discs: Anterior cervical fusion from C3 through C6. Degenerative disease with disc height loss at C6-7.   C2-3: No significant disc bulge. No neural foraminal stenosis. No central canal stenosis.   C3-4: Interbody fusion. Mild right foraminal stenosis. No left foraminal stenosis. No spinal stenosis.   C4-5: Interbody fusion. Mild bilateral foraminal stenosis. No spinal stenosis.   C5-6: Interbody fusion.  No foraminal or central canal stenosis.   C6-7: Mild broad-based disc bulge. Bilateral uncovertebral degenerative changes. Severe left and moderate right foraminal stenosis. Mild spinal stenosis.   C7-T1: Mild broad-based disc bulge. Mild bilateral facet arthropathy. Mild left foraminal stenosis. No right foraminal stenosis. No spinal stenosis.   Mild broad-based disc bulge. No foraminal or central canal stenosis. No spinal stenosis.   IMPRESSION: 1. Anterior cervical fusion from C3 through C6. Mild right foraminal stenosis at C3-4. Mild bilateral foraminal stenosis at C4-5. 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 2. At C6-7 there is a mild broad-based disc bulge. Bilateral uncovertebral degenerative changes. Severe left and moderate right foraminal stenosis. Mild spinal stenosis. 3. At C7-T1 there is a mild broad-based disc bulge. Mild bilateral facet arthropathy. Mild left foraminal stenosis.     Electronically Signed   By: Elige Ko M.D.   On: 01/12/2023 13:52     Objective:  VS:  HT:    WT:   BMI:     BP:(!) 149/89  HR:82bpm  TEMP: ( )  RESP:  Physical Exam Vitals and nursing note reviewed.  Constitutional:      General: She is not  in acute distress.    Appearance: Normal appearance. She is not ill-appearing.  HENT:     Head: Normocephalic and atraumatic.     Right Ear: External ear normal.     Left Ear: External ear normal.  Eyes:     Extraocular Movements: Extraocular movements intact.  Cardiovascular:     Rate and Rhythm: Normal rate.     Pulses: Normal pulses.  Musculoskeletal:     Cervical back: Tenderness present. No rigidity.     Right lower leg: No edema.     Left lower leg: No edema.     Comments: Patient has good strength in the upper extremities including 5 out of 5 strength in wrist extension long finger flexion and APB.  There is no atrophy of the hands intrinsically.  There is a negative Hoffmann's test.   Lymphadenopathy:     Cervical: No cervical adenopathy.  Skin:    Findings: No erythema, lesion or rash.  Neurological:     General: No focal deficit present.     Mental Status: She is alert and oriented to person, place, and time.     Sensory: No sensory deficit.     Motor: No weakness or abnormal muscle tone.     Coordination: Coordination normal.  Psychiatric:        Mood and Affect: Mood normal.        Behavior: Behavior normal.  Imaging: No results found.

## 2024-01-12 ENCOUNTER — Other Ambulatory Visit: Payer: Self-pay

## 2024-01-18 ENCOUNTER — Other Ambulatory Visit (HOSPITAL_COMMUNITY): Payer: Self-pay

## 2024-01-18 ENCOUNTER — Other Ambulatory Visit: Payer: Self-pay

## 2024-01-18 NOTE — Progress Notes (Signed)
 Specialty Pharmacy Refill Coordination Note  Jenny [name] is a 83 y.o. female contacted today regarding refills of specialty medication(s) Dupilumab (Dupixent)   Patient requested (Patient-Rptd) Delivery   Delivery date: 01/20/24 (Unable to fullfill for 01/18/24 delivery request)   Verified address: (Patient-Rptd) [address]  [place], [place] [address]   Medication will be filled on 01/19/24.

## 2024-01-19 ENCOUNTER — Other Ambulatory Visit (HOSPITAL_COMMUNITY): Payer: Self-pay

## 2024-01-19 ENCOUNTER — Other Ambulatory Visit: Payer: Self-pay

## 2024-01-19 ENCOUNTER — Other Ambulatory Visit: Payer: Self-pay | Admitting: Pharmacy Technician

## 2024-01-19 NOTE — Progress Notes (Signed)
 Specialty Pharmacy Refill Coordination Note  Jenny [name] is a 83 y.o. female contacted today regarding refills of specialty medication(s) Dupilumab (Dupixent)   Patient requested (Patient-Rptd) Delivery   Delivery date: 01/20/24 (Unable to fullfill for 01/18/24 delivery request)   Verified address: (Patient-Rptd) [address]  [place], [place] [address]   Medication will be filled on 01/19/24.

## 2024-01-19 NOTE — Telephone Encounter (Signed)
 Pt's copay is currently $358.50 after her first and only fill utilizing the grant. This should be the remainder of her Max OOP for 2025. Attempted to contact pt to discuss, left VoiceMail requesting a return call. Direct office number provided.  It is estimated that pt would have a monthly payment of ~$40 if she were enroll into the MPPP prior to the end of April.

## 2024-01-20 ENCOUNTER — Ambulatory Visit (HOSPITAL_BASED_OUTPATIENT_CLINIC_OR_DEPARTMENT_OTHER): Payer: Self-pay | Admitting: Pulmonary Disease

## 2024-01-20 ENCOUNTER — Other Ambulatory Visit: Payer: Self-pay

## 2024-01-20 ENCOUNTER — Encounter (HOSPITAL_BASED_OUTPATIENT_CLINIC_OR_DEPARTMENT_OTHER): Payer: Self-pay | Admitting: Pulmonary Disease

## 2024-01-20 VITALS — BP 122/78 | HR 80 | Ht 64.0 in | Wt 209.2 lb

## 2024-01-20 DIAGNOSIS — G4733 Obstructive sleep apnea (adult) (pediatric): Secondary | ICD-10-CM | POA: Diagnosis not present

## 2024-01-20 DIAGNOSIS — J4550 Severe persistent asthma, uncomplicated: Secondary | ICD-10-CM

## 2024-01-20 DIAGNOSIS — J849 Interstitial pulmonary disease, unspecified: Secondary | ICD-10-CM

## 2024-01-20 NOTE — Telephone Encounter (Signed)
 Spoke with pt and explained the options available to her. She decided to simply go ahead and pay the copay in full so that she wouldn't have to worry about anything else for the remainder of the year. Payment information collected and forwarded to [facility] at [facility]. Advised that her Rx would likely not be shipped until Monday, she verbalized understanding.  Nothing further needed at this time.

## 2024-01-20 NOTE — Progress Notes (Signed)
 Subjective:    Patient ID: Jenny [name], female    DOB: [birth year], 83 y.o.   MRN: [id number]  HPI  83 y.o.  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 She has untreated OSA , she did not start on CPAP suggested on initial OV   The patient, with a history of interstitial lung disease (ILD), sleep apnea, and asthma, presents with ongoing tiredness and back pain. She reports that the tiredness has improved somewhat, but she believes it is largely due to her back pain. She has had an injection in her shoulder to rule out shoulder pain, but she believes the pain is originating from her neck. She also mentions that she has been having trouble sleeping, which she attributes to her back pain and frequent urination at night.  The patient's asthma is being managed with Dupixent and Symbicort. She recently had issues with her Dupixent prescription, but it has been resolved with the help of the [facility]. She is also on Lasix for fluid retention, which she takes as needed when she notices swelling.  The patient has a history of sleep apnea, but she has not been using her CPAP machine. She reports that she did not sleep well when she was using it and that she is currently not sleeping well. She also mentions that she takes gabapentin for leg issues, but she has been trying to reduce her use of it due to balance issues.  Significant tests/ events reviewed   Spirometry 04/21/18 >> FEV1 1.3 (64%), FEV1% 83 IgE 01/30/20 >> 40, Absolute eosinophils 600 PFT  04/17/20 >> FEV1 1.30 (65%), FEV1% 79, TLC 6.97 (133%), RV 5.11 (207%), DLCO 88% Serology 04/17/20 >> ANA, HP panel, anti CCP negative; RF 25   Imaging : HRCT chest 11/2023 >> unchanged fibrotic NSIP HRCT chest 02/09/20 >> atherosclerosis, 3 mm nodule LLL, mild patchy subpleural reticulation and GGO b/l with mild basilar predominance HRCT chest 02/04/21 >> centrilobular and paraseptal emphysema; mild peripheral and basilar predominant subpleural reticulation and GGO slight increase from April 2021, minimal traction BTX (probable UIP pattern)   Sleep : PSG April 2008 >> AHI 29, SpO2 low 62%   Review of Systems neg for any significant sore throat, dysphagia, itching, sneezing, nasal congestion or excess/ purulent secretions, fever, chills, sweats, unintended wt loss, pleuritic or exertional cp, hempoptysis, orthopnea pnd or change in chronic leg swelling. Also denies presyncope, palpitations, heartburn, abdominal pain, nausea, vomiting, diarrhea or change in bowel or urinary habits, dysuria,hematuria, rash, arthralgias, visual complaints, headache, numbness weakness or ataxia.     Objective:   Physical Exam  Gen. Pleasant, obese, in no distress ENT - no lesions, no post nasal drip Neck: No JVD, no thyromegaly, no carotid bruits Lungs: no use of accessory muscles, no dullness to percussion, decreased without rales or rhonchi  Cardiovascular: Rhythm regular, heart sounds  normal, no murmurs or gallops, no peripheral edema Musculoskeletal: No deformities, no cyanosis or clubbing , no tremors       Assessment & Plan:   Interstitial Lung Disease (ILD) ILD is  well-managed with no progression of fibrosis as per the recent scan, which shows no change from three years ago.   Asthma Asthma is well-controlled with Dupixent and Symbicort. Medication cost issues were resolved through assistance from the [employer].  OSA She does not wish to resume CPAP therapy at this time despite previous  improvement in sleep quality with its use. Reports nocturia and concerns about her heart condition affecting sleep. - Hold off on further sleep testing for now.  Vitamin D Deficiency Suspects vitamin D levels may affect her energy. Recently self-administered a high dose of vitamin D. - Monitor vitamin D levels and symptoms.

## 2024-01-20 NOTE — Patient Instructions (Signed)
 Scarring in your lungs is stable on CT scan.  Continue on Dupixent and Symbicort  Let us know if tiredness gets worse we can proceed with a home sleep test

## 2024-01-29 ENCOUNTER — Encounter: Payer: Self-pay | Admitting: Podiatry

## 2024-01-31 NOTE — Telephone Encounter (Signed)
 Can we add her on for calluses on May 5th or 6th?

## 2024-02-08 ENCOUNTER — Other Ambulatory Visit: Payer: Self-pay

## 2024-02-08 ENCOUNTER — Ambulatory Visit: Admitting: Podiatry

## 2024-02-08 ENCOUNTER — Other Ambulatory Visit: Payer: Self-pay | Admitting: Pharmacy Technician

## 2024-02-08 DIAGNOSIS — B351 Tinea unguium: Secondary | ICD-10-CM

## 2024-02-08 DIAGNOSIS — M7752 Other enthesopathy of left foot: Secondary | ICD-10-CM | POA: Diagnosis not present

## 2024-02-08 DIAGNOSIS — M79674 Pain in right toe(s): Secondary | ICD-10-CM

## 2024-02-08 DIAGNOSIS — M79675 Pain in left toe(s): Secondary | ICD-10-CM | POA: Diagnosis not present

## 2024-02-08 NOTE — Progress Notes (Unsigned)
 Subjective: Chief Complaint  Patient presents with   Callouses    Pt presents with concerns of calluses to her left foot that is causing pain as well as for thick nails that she want to be trimmed.     83 year old female presents the office today for concerns of calluses to her left foot that is causing pain, mostly left foot submetatarsal 5.  Nails are also thickened elongated she cannot trim herself.  She does not report any recent injury or changes otherwise.   Objective: AAO x3, NAD DP/PT pulses palpable bilaterally, CRT less than 3 seconds Hyperkeratotic lesions left foot submetatarsal 1, 5 without any underlying ulceration drainage or signs of infection.  No edema, erythema.  Prominent metatarsal heads as well as along the fifth MTPJ on the left side where she has majority discomfort. Nails are mildly hypertrophic, dystrophic and elongated causing discomfort x 10.  No edema, erythema or signs of infection. No pain with calf compression, swelling, warmth, erythema  Assessment: Hyperkeratotic lesions left foot; neuropathy  Plan: -All treatment options discussed with the patient including all alternatives, risks, complications.  -Sharply debrided nails x 10 without any complications or bleeding -Serpe debrided hyperkeratotic lesion x 2 without any complications or bleeding. -Steroid injection performed submetatarsal 5 left foot.  Discussed risks and verbal consent obtained.  There is still present on alcohol.  A mixture of 0.5 cc of cefazolin  phosphate, 0.5 cc of Marcaine  plain was infiltrated in the area of tenderness on the fifth MPJ without complications.  Postinjection care discussed.  Tolerated well. -Sharply debrided the hyperkeratotic lesions x2 without any complications or bleeding. -Discussed gabapentin  increase it to twice a day.  Monitoring side effects. -Moisturizer, offloading. -Patient encouraged to call the office with any questions, concerns, change in symptoms.    Return in about 2 months (around 04/10/2024) for routine care.  Jenny [name] DPM

## 2024-02-08 NOTE — Progress Notes (Signed)
 Specialty Pharmacy Refill Coordination Note  Jenny [name] is a 83 y.o. female contacted today regarding refills of specialty medication(s) Dupilumab  (Dupixent )   Patient requested (Patient-Rptd) Delivery   Delivery date: (Patient-Rptd) 02/14/24 **New Delivery Date 02/16/24 Spoke with patient & aware**   Verified address: (Patient-Rptd) [address] [place] [phone]   Medication will be filled on 02/15/24.

## 2024-02-14 ENCOUNTER — Encounter: Payer: Self-pay | Admitting: Family Medicine

## 2024-02-16 ENCOUNTER — Encounter: Payer: Self-pay | Admitting: Family Medicine

## 2024-02-16 ENCOUNTER — Ambulatory Visit (INDEPENDENT_AMBULATORY_CARE_PROVIDER_SITE_OTHER): Admitting: Family Medicine

## 2024-02-16 VITALS — BP 124/74 | HR 69 | Temp 97.9°F | Wt 210.4 lb

## 2024-02-16 DIAGNOSIS — R5383 Other fatigue: Secondary | ICD-10-CM | POA: Diagnosis not present

## 2024-02-16 LAB — CBC WITH DIFFERENTIAL/PLATELET
Basophils Absolute: 0.1 10*3/uL (ref 0.0–0.1)
Basophils Relative: 0.8 % (ref 0.0–3.0)
Eosinophils Absolute: 0.3 10*3/uL (ref 0.0–0.7)
Eosinophils Relative: 3.9 % (ref 0.0–5.0)
HCT: 43.4 % (ref 36.0–46.0)
Hemoglobin: 14.2 g/dL (ref 12.0–15.0)
Lymphocytes Relative: 27.8 % (ref 12.0–46.0)
Lymphs Abs: 2 10*3/uL (ref 0.7–4.0)
MCHC: 32.8 g/dL (ref 30.0–36.0)
MCV: 94.9 fl (ref 78.0–100.0)
Monocytes Absolute: 0.7 10*3/uL (ref 0.1–1.0)
Monocytes Relative: 9.5 % (ref 3.0–12.0)
Neutro Abs: 4.2 10*3/uL (ref 1.4–7.7)
Neutrophils Relative %: 58 % (ref 43.0–77.0)
Platelets: 260 10*3/uL (ref 150.0–400.0)
RBC: 4.57 Mil/uL (ref 3.87–5.11)
RDW: 13.8 % (ref 11.5–15.5)
WBC: 7.2 10*3/uL (ref 4.0–10.5)

## 2024-02-16 LAB — BASIC METABOLIC PANEL WITH GFR
BUN: 18 mg/dL (ref 6–23)
CO2: 31 meq/L (ref 19–32)
Calcium: 9.5 mg/dL (ref 8.4–10.5)
Chloride: 102 meq/L (ref 96–112)
Creatinine, Ser: 0.93 mg/dL (ref 0.40–1.20)
GFR: 56.15 mL/min — ABNORMAL LOW (ref >60.00)
Glucose, Bld: 94 mg/dL (ref 70–99)
Potassium: 4.7 meq/L (ref 3.5–5.1)
Sodium: 138 meq/L (ref 135–145)

## 2024-02-16 LAB — HEPATIC FUNCTION PANEL
ALT: 10 U/L (ref 0–35)
AST: 19 U/L (ref 0–37)
Albumin: 4.2 g/dL (ref 3.5–5.2)
Alkaline Phosphatase: 83 U/L (ref 39–117)
Bilirubin, Direct: 0.2 mg/dL (ref 0.0–0.3)
Total Bilirubin: 0.6 mg/dL (ref 0.2–1.2)
Total Protein: 6.9 g/dL (ref 6.0–8.3)

## 2024-02-16 LAB — B12 AND FOLATE PANEL
Folate: 10.2 ng/mL (ref >5.9)
Vitamin B-12: 202 pg/mL — ABNORMAL LOW (ref 211–911)

## 2024-02-16 LAB — VITAMIN D 25 HYDROXY (VIT D DEFICIENCY, FRACTURES): VITD: 36.12 ng/mL (ref 30.00–100.00)

## 2024-02-16 LAB — TSH: TSH: 5.64 u[IU]/mL — ABNORMAL HIGH (ref 0.35–5.50)

## 2024-02-16 NOTE — Progress Notes (Signed)
   Subjective:    Patient ID: Jenny [name], female    DOB: January 13, 1941, 83 y.o.   MRN: [id number]  HPI Fatigue- 'i think old age is catching up'.  Has seen Pulmonary.  Pt has poor sleep- not using CPAP recently.  She is helping to raise great grandchild who lives w/ her (age 94 w/ CP- not able to sit independently) while her mother is incarcerated.  Taking medications regularly.  Not currently taking Vitamins.  Pt is feeling both physically and emotionally tired.  Has chronic back pain which is taking its toll.  'some days I could lay in bed all day long'.     Review of Systems For ROS see HPI     Objective:   Physical Exam Vitals reviewed.  Constitutional:      General: She is not in acute distress.    Appearance: Normal appearance. She is well-developed.  HENT:     Head: Normocephalic and atraumatic.  Eyes:     Conjunctiva/sclera: Conjunctivae normal.     Pupils: Pupils are equal, round, and reactive to light.  Neck:     Thyroid : No thyromegaly.  Cardiovascular:     Rate and Rhythm: Normal rate and regular rhythm.     Heart sounds: Normal heart sounds. No murmur heard. Pulmonary:     Effort: Pulmonary effort is normal. No respiratory distress.     Breath sounds: Normal breath sounds.  Abdominal:     General: There is no distension.     Palpations: Abdomen is soft.     Tenderness: There is no abdominal tenderness.  Musculoskeletal:     Cervical back: Normal range of motion and neck supple.  Lymphadenopathy:     Cervical: No cervical adenopathy.  Skin:    General: Skin is warm and dry.  Neurological:     Mental Status: She is alert and oriented to person, place, and time.  Psychiatric:        Behavior: Behavior normal.           Assessment & Plan:  Fatigue- new.  Suspect this is multifactorial- age, medical issues, obesity, emotional stress of a special needs 83 yo at home.  Will check labs to r/o metabolic causes and tx any abnormalities if present.  Stressed need  for pt to restart CPAP.  Encouraged her to rest when needed and accept help from others in caring for toddler.  Pt expressed understanding and is in agreement w/ plan.

## 2024-02-16 NOTE — Patient Instructions (Signed)
 Follow up as needed or as scheduled We'll notify you of your lab results and make any changes if needed RESTART the CPAP if possible- this will improve your quality of sleep Make sure you are taking care of you!!!! Call with any questions or concerns Stay Safe!  Stay Healthy! Happy Mother's Day!!! Hang in there!

## 2024-02-17 ENCOUNTER — Other Ambulatory Visit: Payer: Self-pay

## 2024-02-17 ENCOUNTER — Telehealth: Payer: Self-pay

## 2024-02-17 ENCOUNTER — Encounter: Payer: Self-pay | Admitting: Family Medicine

## 2024-02-17 DIAGNOSIS — E039 Hypothyroidism, unspecified: Secondary | ICD-10-CM

## 2024-02-17 LAB — IRON,TIBC AND FERRITIN PANEL
%SAT: 23 % (ref 16–45)
Ferritin: 34 ng/mL (ref 16–288)
Iron: 73 ug/dL (ref 45–160)
TIBC: 323 ug/dL (ref 250–450)

## 2024-02-17 MED ORDER — LEVOTHYROXINE SODIUM 125 MCG PO TABS: 125.0000 ug | ORAL_TABLET | Freq: Every day | ORAL | 3 refills | Status: DC

## 2024-02-17 NOTE — Telephone Encounter (Signed)
 Pt has lab appt for 1 month Nurse visit for B12 injection Levothyroxine  125 has been sent Pt has no concerns or questions

## 2024-02-17 NOTE — Telephone Encounter (Signed)
-----   Message from Laymon Priest sent at 02/17/2024  7:44 AM EDT ----- We have identified a couple of reasons for fatigue-  1) your TSH is mildly elevated.  This means your Levothyroxine  dose is too low.  We will increase this to 125mcg daily (#30, 3 refills) and repeat your TSH level at a lab only visit in 1 month (TSH, dx hypothyroid)  2) your B12 is low.  Based on this, we can start B12 injections weekly x4 weeks and then monthly x6 months  Remainder of labs look good!

## 2024-02-18 ENCOUNTER — Encounter: Payer: Self-pay | Admitting: Family Medicine

## 2024-02-18 MED ORDER — LEVOTHYROXINE SODIUM 125 MCG PO TABS: 125.0000 ug | ORAL_TABLET | Freq: Every day | ORAL | 3 refills | Status: DC

## 2024-02-21 ENCOUNTER — Other Ambulatory Visit: Payer: Self-pay | Admitting: Family Medicine

## 2024-02-21 ENCOUNTER — Ambulatory Visit

## 2024-02-22 ENCOUNTER — Ambulatory Visit (INDEPENDENT_AMBULATORY_CARE_PROVIDER_SITE_OTHER)

## 2024-02-22 DIAGNOSIS — E538 Deficiency of other specified B group vitamins: Secondary | ICD-10-CM | POA: Diagnosis not present

## 2024-02-22 NOTE — Progress Notes (Signed)
 Jenny [name] is a 83 y.o. female presents to the office today for Cyanocobalamin  injection  per physician's orders. Injection was administered Intramuscular Right deltoid.   Patient's next injection due 1 week, appt made? yes  [name]

## 2024-02-24 ENCOUNTER — Other Ambulatory Visit: Payer: Self-pay | Admitting: Surgical

## 2024-02-24 MED ORDER — MELOXICAM 7.5 MG PO TABS: 7.5000 mg | ORAL_TABLET | Freq: Every day | ORAL | 0 refills | Status: DC | PRN

## 2024-02-29 ENCOUNTER — Ambulatory Visit: Payer: HMO | Admitting: Podiatry

## 2024-02-29 ENCOUNTER — Ambulatory Visit

## 2024-02-29 ENCOUNTER — Ambulatory Visit (INDEPENDENT_AMBULATORY_CARE_PROVIDER_SITE_OTHER): Admitting: Family Medicine

## 2024-02-29 DIAGNOSIS — E538 Deficiency of other specified B group vitamins: Secondary | ICD-10-CM

## 2024-02-29 MED ORDER — CYANOCOBALAMIN 1000 MCG/ML IJ SOLN
1000.0000 ug | Freq: Once | INTRAMUSCULAR | Status: AC
Start: 2024-02-29 — End: 2024-03-07
  Administered 2024-03-07: 1000 ug via INTRAMUSCULAR

## 2024-02-29 NOTE — Progress Notes (Signed)
 Jenny [name] is a 83 y.o. female presents to the office today for Dr.Tabori per physician's orders. Injection was administered Intramuscular Right deltoid.   Patient's next injection due 1 wk, appt made? Yes  [name]

## 2024-03-03 ENCOUNTER — Encounter (HOSPITAL_BASED_OUTPATIENT_CLINIC_OR_DEPARTMENT_OTHER): Payer: Self-pay | Admitting: Student

## 2024-03-03 ENCOUNTER — Ambulatory Visit (HOSPITAL_BASED_OUTPATIENT_CLINIC_OR_DEPARTMENT_OTHER): Admitting: Student

## 2024-03-03 DIAGNOSIS — M5432 Sciatica, left side: Secondary | ICD-10-CM | POA: Diagnosis not present

## 2024-03-03 NOTE — Progress Notes (Signed)
 Chief Complaint: Left knee pain     History of Present Illness:    Jenny [name] is a 83 y.o. female who is a patient of Dr. Rozelle Corning and Prentis Brock, PA-C, presenting with a chief complaint of left knee pain.  Patient states that approximately 10 days ago, she began experiencing some pain in the left knee and lower leg without any known cause or injury.  States that yesterday her symptoms were much worse and described that her left knee and ankle were giving way.  She is experiencing some pain in the posterior thigh, left knee, and lateral left lower extremity.  She has also been receiving consistent gel injections for left knee osteoarthritis.  She did try taking her meloxicam  yesterday and states that she has felt like this has helped.  Overall she is concerned given that her symptoms are causing her to feel a bit unstable.   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   GERD (gastroesophageal reflux disease)    HYPERTENSION 02/22/2008   HYPOTHYROIDISM 08/23/2007   Interstitial lung disease (HCC)    OBESITY 08/23/2007   Peripheral neuropathy 02/12/2011   possibly related to spondylolisthesis per pt   Pneumonia due to COVID-19 virus 09/2020   PONV (postoperative nausea and vomiting)    Sleep apnea    VENOUS INSUFFICIENCY 08/23/2007   VITAMIN D  DEFICIENCY 08/21/2008   Past Surgical History:  Procedure Laterality Date   ABDOMINAL HYSTERECTOMY     took one ovary   AP repair and sling for cystocele  8-04    Dr  Isla Mari   APPENDECTOMY     carpal tunel  2008   CHOLECYSTECTOMY     ctr     bilateral   EYE SURGERY     left hand  2010   left thumb, joint removal, CTR   NECK SURGERY     x 2 - discectomy, fusion   SHOULDER ARTHROSCOPY DISTAL CLAVICLE EXCISION AND OPEN ROTATOR CUFF REPAIR     right   TONSILLECTOMY AND ADENOIDECTOMY     TUBAL LIGATION     Social History   Socioeconomic History   Marital status: Widowed    Spouse name: Not on file   Number of children: Not on file   Years of education: Not on file   Highest education level: GED or equivalent  Occupational History   Occupation: Retired  Tobacco Use   Smoking status: Former    Current packs/day: 0.00    Average packs/day: 1.5 packs/day for 30.0 years (45.0 ttl pk-yrs)    Types: Cigarettes    Start date: 10/12/1961    Quit date: 10/13/1991    Years since quitting: 32.4   Smokeless tobacco: Never  Vaping Use   Vaping status: Never Used  Substance and Sexual Activity   Alcohol use: No    Alcohol/week: 0.0 standard drinks of alcohol  Drug use: No   Sexual activity: Never  Other Topics Concern   Not on file  Social History Narrative   Not on file   Social Drivers of Health   Financial Resource Strain: Medium Risk (02/14/2024)   Overall Financial Resource Strain (CARDIA)    Difficulty of Paying Living Expenses: Somewhat hard  Food Insecurity: No Food Insecurity (02/14/2024)   Hunger Vital Sign    Worried About Running Out of Food in the Last Year: Never true    Ran Out of Food in the Last Year: Never true  Transportation Needs: No Transportation Needs (02/14/2024)   PRAPARE - Administrator, Civil Service (Medical): No    Lack of Transportation (Non-Medical): No  Physical Activity: Inactive (02/14/2024)   Exercise Vital Sign    Days of Exercise per Week: 0 days    Minutes of Exercise per Session: 0 min  Stress: Stress Concern Present (02/14/2024)   [employer] of Occupational Health - Occupational Stress  Questionnaire    Feeling of Stress : Rather much  Social Connections: Moderately Isolated (02/14/2024)   Social Connection and Isolation Panel [NHANES]    Frequency of Communication with Friends and Family: More than three times a week    Frequency of Social Gatherings with Friends and Family: Once a week    Attends Religious Services: More than 4 times per year    Active Member of [employer] or Organizations: No    Attends Banker Meetings: Never    Marital Status: Widowed   Family History  Problem Relation Age of Onset   COPD Mother    Pneumonia Mother    Other Mother        CHF   Hypertension Mother    Arthritis Mother        septic knee after replacement   Heart disease Mother        chf   Dementia Father    Arthritis Brother    Asthma Daughter    Colon polyps Daughter    Stroke Maternal Grandmother    Heart disease Maternal Grandmother    Other Maternal Grandfather        CHF   Heart disease Maternal Grandfather        chf   Cancer Paternal Grandmother        stomach?   Other Paternal Grandfather        problems with kidneys   Kidney disease Paternal Grandfather    Anxiety disorder Daughter    Cancer Maternal Aunt        breast   Cancer Maternal Aunt        liver   Colon cancer Neg Hx    Allergies  Allergen Reactions   Prednisone  Other (See Comments)    Confusion, dizziness Medrol  is ok   Other Other (See Comments)    Patient states that she cannot have any spicy foods   Codeine Nausea Only        Tramadol  Itching   Current Outpatient Medications  Medication Sig Dispense Refill   albuterol  (VENTOLIN  HFA) 108 (90 Base) MCG/ACT inhaler INHALE 1-2 PUFFS INTO THE LUNGS EVERY 6 HOURS AS NEEDED. 8.5 each 3   ALPRAZolam  (XANAX ) 0.5 MG tablet TAKE 1/2 TO 1 TABLET BY MOUTH 3 TIMES DAILY AS NEEDED FOR ANXIETY 90 tablet 1   amLODipine  (NORVASC ) 5 MG tablet TAKE 1 TABLET BY MOUTH DAILY 100 tablet 2   budesonide -formoterol  (SYMBICORT ) 80-4.5 MCG/ACT inhaler  Inhale 2 puffs  into the lungs 2 (two) times daily. 1 each 11   Dupilumab  (DUPIXENT ) 300 MG/2ML SOAJ Inject 300 mg into the skin every 14 (fourteen) days. 12 mL 1   escitalopram  (LEXAPRO ) 20 MG tablet TAKE 1 TABLET BY MOUTH DAILY 90 tablet 3   fluticasone  (FLONASE ) 50 MCG/ACT nasal spray PLACE 1 SPRAY INTO BOTH NOSTRILS DAILY. 48 mL 1   furosemide  (LASIX ) 20 MG tablet TAKE 1 TABLET BY MOUTH DAILY (Patient taking differently: Take 20 mg by mouth as needed.) 100 tablet 2   gabapentin  (NEURONTIN ) 300 MG capsule TAKE 1 CAPSULE BY MOUTH EVERYDAY AT BEDTIME 90 capsule 0   levothyroxine  (SYNTHROID ) 125 MCG tablet Take 1 tablet (125 mcg total) by mouth daily. 30 tablet 3   meloxicam  (MOBIC ) 7.5 MG tablet Take 1 tablet (7.5 mg total) by mouth daily as needed for pain. 30 tablet 0   pantoprazole  (PROTONIX ) 40 MG tablet TAKE 1 TABLET BY MOUTH EVERY DAY 30 MINUTES BEFORE DINNER 90 tablet 3   Current Facility-Administered Medications  Medication Dose Route Frequency Provider Last Rate Last Admin   cyanocobalamin  (VITAMIN B12) injection 1,000 mcg  1,000 mcg Intramuscular Once Tabori, Katherine E, MD       No results found.  Review of Systems:   A ROS was performed including pertinent positives and negatives as documented in the HPI.  Physical Exam :   Constitutional: NAD and appears stated age Neurological: Alert and oriented Psych: Appropriate affect and cooperative There were no vitals taken for this visit.   Comprehensive Musculoskeletal Exam:    Patient is ambulating with assistance of a cane.  She does demonstrate some tenderness with palpation in the lumbar spine and left paraspinal musculature.  Left knee flexion strength is 4/5.  Bilateral knee extension and ankle dorsiflexion/plantarflexion strength is 5/5.  Slightly decreased sensation noted in the lateral left thigh and lower leg.  Imaging:     Assessment:   83 y.o. female with left knee and leg pain.  Her current symptoms do seem more  consistent with sciatica given presence of paresthesias and feeling of instability in the knee and ankle.  Overall strength is well-maintained despite some slight loss of left knee flexion strength which may to be more attributable to her underlying arthritis.  Symptoms have improved somewhat today as she has recently begun taking meloxicam  7.5 mg.  Given her improvement, I have recommended continuing on this to see if her flareup continues to calm down.  She does have extensive lumbar history with an L4-L5 and L5-S1 fusion and subsequent adjacent segment disease.  Should her symptoms persist, recommend she may need to follow back up with Dr. Rozelle Corning on this to consider if further imaging or injections of the lumbar spine would be beneficial.  Plan :    - Continue current course of meloxicam  and follow-up with Dr. Aundria Bloom as needed     I personally saw and evaluated the patient, and participated in the management and treatment plan.  Sharrell Deck, PA-C Orthopedics

## 2024-03-07 ENCOUNTER — Ambulatory Visit (INDEPENDENT_AMBULATORY_CARE_PROVIDER_SITE_OTHER)

## 2024-03-07 DIAGNOSIS — E538 Deficiency of other specified B group vitamins: Secondary | ICD-10-CM | POA: Diagnosis not present

## 2024-03-07 NOTE — Progress Notes (Signed)
 Jenny [name] is a 83 y.o. female presents to the office today for Cyanocobalamin  Injection1000 mcg per physician's orders. Injection was administered Intramuscular Right deltoid.   Patient's next injection due 1 week, appt made? yes June 3rd is last weekly injection, then will be monthly for 6 months   [name]

## 2024-03-08 ENCOUNTER — Other Ambulatory Visit: Payer: Self-pay

## 2024-03-12 DIAGNOSIS — M5432 Sciatica, left side: Secondary | ICD-10-CM

## 2024-03-13 ENCOUNTER — Other Ambulatory Visit: Payer: Self-pay

## 2024-03-13 NOTE — Telephone Encounter (Signed)
 Order entered

## 2024-03-13 NOTE — Progress Notes (Signed)
 Specialty Pharmacy Refill Coordination Note  Jenny [name] is a 83 y.o. female contacted today regarding refills of specialty medication(s) Dupilumab  (Dupixent )   Patient requested (Patient-Rptd) Delivery   Delivery date: (Patient-Rptd) 03/28/24   Verified address: (Patient-Rptd) [address] [place]   Medication will be filled on 06.16.25.

## 2024-03-13 NOTE — Telephone Encounter (Signed)
 Can we order MRI L-spine to eval spinal stenosis and left leg radic?

## 2024-03-14 ENCOUNTER — Ambulatory Visit (INDEPENDENT_AMBULATORY_CARE_PROVIDER_SITE_OTHER)

## 2024-03-14 DIAGNOSIS — E538 Deficiency of other specified B group vitamins: Secondary | ICD-10-CM | POA: Diagnosis not present

## 2024-03-14 MED ORDER — CYANOCOBALAMIN 1000 MCG/ML IJ SOLN
1000.0000 ug | Freq: Once | INTRAMUSCULAR | Status: AC
  Administered 2024-03-14: 1000 ug via INTRAMUSCULAR

## 2024-03-14 NOTE — Addendum Note (Signed)
 Addended by: Isiah Scheel K on: 03/14/2024 08:50 AM   Modules accepted: Level of Service

## 2024-03-14 NOTE — Progress Notes (Signed)
 Jenny [name] is a 83 y.o. female presents to the office today for Cyanocobalamin  injection 1,000 mcg/mL per physician's orders. Injection was administered Intramuscular Right deltoid.   Patient's next injection due 1 Month, appt made? yes  [employer]

## 2024-03-20 ENCOUNTER — Other Ambulatory Visit

## 2024-03-20 ENCOUNTER — Other Ambulatory Visit (INDEPENDENT_AMBULATORY_CARE_PROVIDER_SITE_OTHER)

## 2024-03-20 DIAGNOSIS — E039 Hypothyroidism, unspecified: Secondary | ICD-10-CM

## 2024-03-21 ENCOUNTER — Other Ambulatory Visit

## 2024-03-23 ENCOUNTER — Ambulatory Visit: Payer: Self-pay | Admitting: Family Medicine

## 2024-03-23 LAB — TSH: TSH: 1.5 u[IU]/mL (ref 0.35–5.50)

## 2024-03-24 ENCOUNTER — Other Ambulatory Visit: Payer: Self-pay

## 2024-03-25 ENCOUNTER — Ambulatory Visit
Admission: RE | Admit: 2024-03-25 | Discharge: 2024-03-25 | Disposition: A | Discharge status: Home / Self Care | Source: Ambulatory Visit | Attending: Surgical | Admitting: Surgical

## 2024-03-25 DIAGNOSIS — M5432 Sciatica, left side: Secondary | ICD-10-CM

## 2024-03-25 DIAGNOSIS — M48061 Spinal stenosis, lumbar region without neurogenic claudication: Secondary | ICD-10-CM | POA: Diagnosis not present

## 2024-03-25 DIAGNOSIS — M47816 Spondylosis without myelopathy or radiculopathy, lumbar region: Secondary | ICD-10-CM | POA: Diagnosis not present

## 2024-03-27 ENCOUNTER — Other Ambulatory Visit: Payer: Self-pay

## 2024-03-29 ENCOUNTER — Other Ambulatory Visit

## 2024-04-05 ENCOUNTER — Encounter: Payer: Self-pay | Admitting: Family Medicine

## 2024-04-05 MED ORDER — ESCITALOPRAM OXALATE 20 MG PO TABS: 20.0000 mg | ORAL_TABLET | Freq: Every day | ORAL | 3 refills | Status: AC

## 2024-04-05 NOTE — Telephone Encounter (Signed)
 Jenny [name], I checked on her MRI images.  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  Can we get her set up for ESI with Dr. Eldonna at that level?  He may want her to see Duwaine first since she was first evaluated by Leonce like a month ago and otherwise we have just been communicating through MyChart so he may want an updated exam.  Does not take blood thinners according to her chart.

## 2024-04-06 NOTE — Addendum Note (Signed)
 Addended by: TRINDA DEANE HERO on: 04/06/2024 10:03 AM   Modules accepted: Orders

## 2024-04-10 ENCOUNTER — Ambulatory Visit (INDEPENDENT_AMBULATORY_CARE_PROVIDER_SITE_OTHER): Admitting: Podiatry

## 2024-04-10 DIAGNOSIS — Q828 Other specified congenital malformations of skin: Secondary | ICD-10-CM | POA: Diagnosis not present

## 2024-04-10 DIAGNOSIS — B351 Tinea unguium: Secondary | ICD-10-CM | POA: Diagnosis not present

## 2024-04-10 DIAGNOSIS — M79675 Pain in left toe(s): Secondary | ICD-10-CM | POA: Diagnosis not present

## 2024-04-10 DIAGNOSIS — G629 Polyneuropathy, unspecified: Secondary | ICD-10-CM

## 2024-04-10 DIAGNOSIS — M79674 Pain in right toe(s): Secondary | ICD-10-CM

## 2024-04-10 NOTE — Progress Notes (Signed)
 Subjective: Chief Complaint  Patient presents with   RFC    RM#12 RFC Nail trimming .     83 year old female presents the office today for concerns of calluses to her left foot that is causing pain, mostly left foot submetatarsal 5.  Nails are also thickened elongated she cannot trim herself.  No open lesions or injuries to her feet.   Objective: AAO x3, NAD DP/PT pulses palpable bilaterally, CRT less than 3 seconds Hyperkeratotic lesions left foot submetatarsal 1, 5 without any underlying ulceration drainage or signs of infection.   Prominent metatarsal heads as well as along the fifth MTPJ on the left side where she has majority discomfort. Nails are mildly hypertrophic, dystrophic and elongated causing discomfort x 10.  No edema, erythema or signs of infection. No pain with calf compression, swelling, warmth, erythema  Assessment: Hyperkeratotic lesions left foot; neuropathy; symptomatic onychomycosis  Plan: -All treatment options discussed with the patient including all alternatives, risks, complications.  -Sharply debrided nails x 10 without any complications or bleeding - Sharply debrided hyperkeratotic lesion x 2 without any complications or bleeding. - Discussed moisturizer, offloading with calluses.  Return in about 2 months (around 06/11/2024).  Jenny [name] DPM

## 2024-04-13 ENCOUNTER — Other Ambulatory Visit: Payer: Self-pay

## 2024-04-13 ENCOUNTER — Ambulatory Visit

## 2024-04-13 ENCOUNTER — Encounter (INDEPENDENT_AMBULATORY_CARE_PROVIDER_SITE_OTHER): Payer: Self-pay

## 2024-04-13 ENCOUNTER — Other Ambulatory Visit: Payer: Self-pay | Admitting: Podiatry

## 2024-04-13 ENCOUNTER — Other Ambulatory Visit: Payer: Self-pay | Admitting: Surgical

## 2024-04-13 DIAGNOSIS — E538 Deficiency of other specified B group vitamins: Secondary | ICD-10-CM | POA: Diagnosis not present

## 2024-04-13 MED ORDER — CYANOCOBALAMIN 1000 MCG/ML IJ SOLN
1000.0000 ug | Freq: Once | INTRAMUSCULAR | Status: AC
  Administered 2024-04-13: 1000 ug via INTRAMUSCULAR

## 2024-04-13 NOTE — Progress Notes (Signed)
 Specialty Pharmacy Refill Coordination Note  Jenny [name] is a 83 y.o. female contacted today regarding refills of specialty medication(s) Dupilumab  (Dupixent )   Patient requested (Patient-Rptd) Delivery   Delivery date: 04/18/24   Verified address: (Patient-Rptd) [address] [place] [address]   Medication will be filled on 04/17/24.

## 2024-04-13 NOTE — Progress Notes (Signed)
 Jenny [name] is a 83 y.o. female presents to the office today for b12 injection per physician's orders. Injection was administered Intramuscular Right deltoid.   Patient's next injection due 05/14/24, appt made? yes  Burnard Gaskins

## 2024-04-20 ENCOUNTER — Other Ambulatory Visit (HOSPITAL_COMMUNITY): Payer: Self-pay

## 2024-05-02 ENCOUNTER — Encounter: Payer: Self-pay | Admitting: Physical Medicine and Rehabilitation

## 2024-05-02 ENCOUNTER — Ambulatory Visit: Admitting: Physical Medicine and Rehabilitation

## 2024-05-02 DIAGNOSIS — R269 Unspecified abnormalities of gait and mobility: Secondary | ICD-10-CM

## 2024-05-02 DIAGNOSIS — M5442 Lumbago with sciatica, left side: Secondary | ICD-10-CM

## 2024-05-02 DIAGNOSIS — G8929 Other chronic pain: Secondary | ICD-10-CM

## 2024-05-02 DIAGNOSIS — M48062 Spinal stenosis, lumbar region with neurogenic claudication: Secondary | ICD-10-CM | POA: Diagnosis not present

## 2024-05-02 DIAGNOSIS — M5416 Radiculopathy, lumbar region: Secondary | ICD-10-CM | POA: Diagnosis not present

## 2024-05-02 NOTE — Telephone Encounter (Signed)
 It is knee time. Cortisone injection would be the only option

## 2024-05-02 NOTE — Progress Notes (Signed)
 Pain Scale   Average Pain 7 Patient advising she as chronic lower back pain radiating to her left leg at times. Patient advises that her pain increases when walking and bending, and decrease when sitting and resting. Patient her for MRI review.        +Driver, -BT, -Dye Allergies.

## 2024-05-02 NOTE — Progress Notes (Signed)
 [name] - 83 y.o. female MRN [id number]  Date of birth: [birth year]  Office Visit Note: Visit Date: 05/02/2024 PCP: Mahlon Comer BRAVO, MD Referred by: Mahlon Comer BRAVO, MD  Subjective: Chief Complaint  Patient presents with   Lower Back - Pain   HPI: Jenny [name] is a 83 y.o. female who comes in today per the request of Jenny [name], [place] for evaluation of chronic, worsening and severe left sided lower back pain, intermittent referral to left buttock, hip and down left leg. Reports feeling of weakness to bilateral legs. Her pain worsens with prolonged standing and walking. She describes pain as sore and aching sensation, currently rates as 7 out of 10. Some relief of pain with home exercise regimen, rest and use of medications. She does take Meloxicam  as needed, also Gabapentin  at bedtime. Recent lumbar MRI imaging shows laminectomy and posterior fusion of L4-S1. There is severe central canal stenosis at moderate foraminal stenosis at L3-L4, moderate central canal stenosis at L2-L3. History of lumbar surgery with Dr. Leeann in 2012. She has undergone cervical epidural steroid injections in our office, most recent was 12/28/2023, significant relief of pain with this procedure. Patient denies recent trauma or falls. She is currently using cane to assist with ambulation.      Review of Systems  Musculoskeletal:  Positive for back pain.  Neurological:  Negative for tingling, sensory change, focal weakness and weakness.  All other systems reviewed and are negative.  Otherwise per HPI.  Assessment & Plan: Visit Diagnoses:    ICD-10-CM   1. Chronic left-sided low back pain with left-sided sciatica  M54.42 Ambulatory referral to Physical Medicine Rehab   G89.29     2. Lumbar radiculopathy  M54.16 Ambulatory referral to Physical Medicine Rehab    3. Spinal stenosis of lumbar region with neurogenic claudication  M48.062 Ambulatory referral to Physical Medicine Rehab    4. Gait  abnormality  R26.9 Ambulatory referral to Physical Medicine Rehab       Plan: Findings:  Chronic, worsening and severe left sided lower back pain, intermittent referral to left buttock, hip and down left leg. Patient continues to have severe pain despite good conservative therapies such as home exercise regimen, rest and use of medications. I discussed recent lumbar MRI with her today using imaging and spine model. Patients clinical presentation and exam are consistent with neurogenic claudication as a result of spinal canal stenosis. There is severe spinal canal stenosis at L3-L4, moderate at L2-L3. I explained to her that this is adjacent segment disease and fairly common with lumbar fusion. We discussed treatment plan in detail today. Next step is to perform diagnostic and hopefully therapeutic left L3 transforaminal epidural steroid injection under fluoroscopic guidance. I discussed injection procedure with her in detail today, she has no questions at this time. Could also look at medication management in the future, she can continue with Meloxicam  and Gabapentin  at this time. No red flag symptoms noted upon exam today.     Meds & Orders: No orders of the defined types were placed in this encounter.   Orders Placed This Encounter  Procedures   Ambulatory referral to Physical Medicine Rehab    Follow-up: Return for Left L3 transforaminal epidural steroid injection.   Procedures: No procedures performed      Clinical History: Narrative & Impression CLINICAL DATA:  Cervical pain for 3-4 months.   EXAM: MRI CERVICAL SPINE WITHOUT CONTRAST   TECHNIQUE: Multiplanar, multisequence MR imaging of the cervical  spine was performed. No intravenous contrast was administered.   COMPARISON:  11/21/2017   FINDINGS: Alignment: Physiologic.   Vertebrae: No acute fracture, evidence of discitis, or aggressive bone lesion.   Cord: Focal area of T2 hyperintensity in the cervical spinal cord  at C4-5 with mild volume loss as can be seen with myelomalacia.   Posterior Fossa, vertebral arteries, paraspinal tissues: Posterior fossa demonstrates no focal abnormality. Vertebral artery flow voids are maintained. Paraspinal soft tissues are unremarkable.   Disc levels:   Discs: Anterior cervical fusion from C3 through C6. Degenerative disease with disc height loss at C6-7.   C2-3: No significant disc bulge. No neural foraminal stenosis. No central canal stenosis.   C3-4: Interbody fusion. Mild right foraminal stenosis. No left foraminal stenosis. No spinal stenosis.   C4-5: Interbody fusion. Mild bilateral foraminal stenosis. No spinal stenosis.   C5-6: Interbody fusion.  No foraminal or central canal stenosis.   C6-7: Mild broad-based disc bulge. Bilateral uncovertebral degenerative changes. Severe left and moderate right foraminal stenosis. Mild spinal stenosis.   C7-T1: Mild broad-based disc bulge. Mild bilateral facet arthropathy. Mild left foraminal stenosis. No right foraminal stenosis. No spinal stenosis.   Mild broad-based disc bulge. No foraminal or central canal stenosis. No spinal stenosis.   IMPRESSION: 1. Anterior cervical fusion from C3 through C6. Mild right foraminal stenosis at C3-4. Mild bilateral foraminal stenosis at C4-5. 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 2. At C6-7 there is a mild broad-based disc bulge. Bilateral uncovertebral degenerative changes. Severe left and moderate right foraminal stenosis. Mild spinal stenosis. 3. At C7-T1 there is a mild broad-based disc bulge. Mild bilateral facet arthropathy. Mild left foraminal stenosis.     Electronically Signed   By: Julaine Blanch M.D.   On: 01/12/2023 13:52   She reports that she quit smoking about 32 years ago. Her smoking use included cigarettes. She started smoking about 62 years ago. She has a 45 pack-year smoking history. She has never used  smokeless tobacco. No results for input(s): HGBA1C, LABURIC in the last 8760 hours.  Objective:  VS:  HT:    WT:   BMI:     BP:   HR: bpm  TEMP: ( )  RESP:  Physical Exam Vitals and nursing note reviewed.  HENT:     Head: Normocephalic and atraumatic.     Right Ear: External ear normal.     Left Ear: External ear normal.     Nose: Nose normal.     Mouth/Throat:     Mouth: Mucous membranes are moist.  Eyes:     Extraocular Movements: Extraocular movements intact.  Cardiovascular:     Rate and Rhythm: Normal rate.     Pulses: Normal pulses.  Pulmonary:     Effort: Pulmonary effort is normal.  Abdominal:     General: Abdomen is flat. There is no distension.  Musculoskeletal:        General: Tenderness present.     Cervical back: Normal range of motion.     Comments: Patient rises from seated position to standing without difficulty. Good lumbar range of motion. No pain noted with facet loading. 5/5 strength noted with bilateral hip flexion, knee flexion/extension, ankle dorsiflexion/plantarflexion and EHL. No clonus noted bilaterally. No pain upon palpation of greater trochanters. No pain with internal/external rotation of bilateral hips. Sensation intact bilaterally. Myofascial tenderness noted upon palpation of left lumbar paraspinal region. Negative slump test bilaterally. Ambulates with cane,  gait slow and unsteady.   Skin:    General: Skin is warm and dry.     Capillary Refill: Capillary refill takes less than 2 seconds.  Neurological:     Mental Status: She is alert and oriented to person, place, and time.     Gait: Gait abnormal.  Psychiatric:        Mood and Affect: Mood normal.        Behavior: Behavior normal.     Ortho Exam  Imaging: No results found.  Past Medical/Family/Surgical/Social History: Medications & Allergies reviewed per EMR, new medications updated. 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   Annual physical exam 08/06/2013   Atrophic vaginitis 06/17/2012   Peripheral vascular disease, unspecified (HCC) 06/02/2012   Urticaria 10/15/2011   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   Vitamin D  deficiency 02/12/2011   Peripheral neuropathy 02/12/2011   Intertrigo 01/21/2011   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   History of chicken pox 01/14/2011   History of measles 01/14/2011   OTHER URINARY INCONTINENCE 03/12/2010   Essential hypertension 02/22/2008   DIVERTICULOSIS OF COLON 02/22/2008   Unilateral primary osteoarthritis, left knee 02/22/2008   Hypothyroidism 08/23/2007   Morbid obesity (HCC) 08/23/2007   OSA on CPAP 08/23/2007   Venous (peripheral) insufficiency 08/23/2007   Allergic rhinitis 08/23/2007   GERD without esophagitis 08/23/2007   BACK PAIN, LUMBAR 08/23/2007   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   Anemia 09/21/2011   Asthma 01/14/2011   Atrophic vaginitis 06/17/2012   BACK PAIN, LUMBAR 08/23/2007   DEGENERATIVE JOINT DISEASE 02/22/2008   Depression with anxiety 03/06/2008   Qualifier: Diagnosis of  By: Christi MD, Glendia HERO    DIVERTICULOSIS OF COLON 02/22/2008   GERD (gastroesophageal reflux disease)    HYPERTENSION 02/22/2008   HYPOTHYROIDISM 08/23/2007   Interstitial lung disease (HCC)    OBESITY 08/23/2007   Peripheral neuropathy 02/12/2011   possibly related to spondylolisthesis per pt   Pneumonia due to COVID-19 virus 09/2020   PONV (postoperative nausea and vomiting)    Sleep apnea    VENOUS INSUFFICIENCY  08/23/2007   VITAMIN D  DEFICIENCY 08/21/2008   Family History  Problem Relation Age of Onset   COPD Mother    Pneumonia Mother    Other Mother        CHF   Hypertension Mother    Arthritis Mother        septic knee after replacement   Heart disease Mother        chf   Dementia Father    Arthritis Brother    Asthma Daughter    Colon polyps Daughter    Stroke Maternal Grandmother    Heart disease Maternal Grandmother    Other Maternal Grandfather        CHF   Heart disease Maternal Grandfather        chf   Cancer Paternal Grandmother        stomach?   Other Paternal Grandfather        problems with  kidneys   Kidney disease Paternal Grandfather    Anxiety disorder Daughter    Cancer Maternal Aunt        breast   Cancer Maternal Aunt        liver   Colon cancer Neg Hx    Past Surgical History:  Procedure Laterality Date   ABDOMINAL HYSTERECTOMY     took one ovary   AP repair and sling for cystocele  8-04    Dr Chales   APPENDECTOMY     carpal tunel  2008   CHOLECYSTECTOMY     ctr     bilateral   EYE SURGERY     left hand  2010   left thumb, joint removal, CTR   NECK SURGERY     x 2 - discectomy, fusion   SHOULDER ARTHROSCOPY DISTAL CLAVICLE EXCISION AND OPEN ROTATOR CUFF REPAIR     right   TONSILLECTOMY AND ADENOIDECTOMY     TUBAL LIGATION     Social History   Occupational History   Occupation: Retired  Tobacco Use   Smoking status: Former    Current packs/day: 0.00    Average packs/day: 1.5 packs/day for 30.0 years (45.0 ttl pk-yrs)    Types: Cigarettes    Start date: 10/12/1961    Quit date: 10/13/1991    Years since quitting: 32.5   Smokeless tobacco: Never  Vaping Use   Vaping status: Never Used  Substance and Sexual Activity   Alcohol use: No    Alcohol/week: 0.0 standard drinks of alcohol   Drug use: No   Sexual activity: Never

## 2024-05-10 ENCOUNTER — Other Ambulatory Visit: Payer: Self-pay

## 2024-05-10 ENCOUNTER — Other Ambulatory Visit: Payer: Self-pay | Admitting: Pharmacy Technician

## 2024-05-10 NOTE — Progress Notes (Signed)
 Submitted a Prior Authorization RENEWAL request to [employer] for DUPIXENT  via CoverMyMeds. Will update once we receive a response.  Key: [id number]

## 2024-05-10 NOTE — Progress Notes (Signed)
 Received notification from [employer] regarding a prior authorization for DUPIXENT . Authorization has been APPROVED from 05/10/2024 to 05/10/2025. Approval letter sent to scan center.  Patient can continue to fill through [facility]: [phone]   Authorization # [id number]  Sherry Pennant, PharmD, MPH, BCPS, CPP Clinical Pharmacist (Rheumatology and Pulmonology)

## 2024-05-12 ENCOUNTER — Other Ambulatory Visit: Payer: Self-pay

## 2024-05-14 ENCOUNTER — Encounter (INDEPENDENT_AMBULATORY_CARE_PROVIDER_SITE_OTHER): Payer: Self-pay

## 2024-05-15 ENCOUNTER — Other Ambulatory Visit: Payer: Self-pay

## 2024-05-15 ENCOUNTER — Ambulatory Visit (INDEPENDENT_AMBULATORY_CARE_PROVIDER_SITE_OTHER): Admitting: Family Medicine

## 2024-05-15 DIAGNOSIS — E539 Vitamin B deficiency, unspecified: Secondary | ICD-10-CM

## 2024-05-15 NOTE — Progress Notes (Signed)
 Patient ID: Jenny [name], female   DOB: April 21, 1941, 83 y.o.   MRN: [id number]  Jenny [name] is a 83 y.o. female presents to the office today for B12 INJECTION per physician's orders. Injection was administered Intramuscular Right deltoid.   Patient's next injection due 4weeks, appt made? yes  [name]

## 2024-05-19 ENCOUNTER — Ambulatory Visit: Admitting: Surgical

## 2024-05-22 ENCOUNTER — Telehealth: Payer: Self-pay

## 2024-05-22 NOTE — Telephone Encounter (Signed)
 VOB submitted for Monovisc, left knee Appt.needs to be after 06/16/2024.

## 2024-05-29 ENCOUNTER — Other Ambulatory Visit: Payer: Self-pay

## 2024-05-29 ENCOUNTER — Ambulatory Visit (INDEPENDENT_AMBULATORY_CARE_PROVIDER_SITE_OTHER): Admitting: Physical Medicine and Rehabilitation

## 2024-05-29 VITALS — BP 118/71 | HR 61

## 2024-05-29 DIAGNOSIS — M5416 Radiculopathy, lumbar region: Secondary | ICD-10-CM | POA: Diagnosis not present

## 2024-05-29 MED ORDER — METHYLPREDNISOLONE ACETATE 40 MG/ML IJ SUSP
40.0000 mg | Freq: Once | INTRAMUSCULAR | Status: AC
  Administered 2024-05-29: 40 mg

## 2024-05-29 NOTE — Patient Instructions (Signed)
  [facility] Physiatry Discharge Instructions  *At any time if you have questions or concerns they can be answered by calling [phone]  All Patients: You may experience an increase in your symptoms for the first 2 days (it can take 2 days to 2 weeks for the steroid/cortisone to have its maximal effect). You may use ice to the site for the first 24 hours; 20 minutes on and 20 minutes off and may use heat after that time. You may resume and continue your current pain medications. If you need a refill please contact the prescribing physician. You may resume your medications if any were stopped for the procedure. You may shower but no swimming, tub bath or Jacuzzi for 24 hours. Please remove bandage after 4 hours. You may resume light activities as tolerated. If you had Spine Injection, you should not drive for the next 3 hours due to anesthetics used in the procedure. Please have someone drive for you.  *If you have had sedation, Valium , Xanax, or lorazepam: Do not drive or use public transportation for 24 hours, do not operating hazardous machinery or make important personal/business decisions for 24 hours.  POSSIBLE STEROID SIDE EFFECTS: If experienced these should only last for a short period. Change in menstrual flow  Edema in (swelling)  Increased appetite Skin flushing (redness)  Skin rash/acne  Thrush (oral) Vaginitis    Increased sweating  Depression Increased blood glucose levels Cramping and leg/calf  Euphoria (feeling happy)  POSSIBLE PROCEDURE SIDE EFFECTS: Please call our office if concerned. Increased pain Increased numbness/tingling  Headache Nausea/vomiting Hematoma (bruising/bleeding) Edema (swelling at the site) Weakness  Infection (red/drainage at site) Fever greater than 100.59F  *In the event of a headache after epidural steroid injection: Drink plenty of fluids, especially water  and try to lay flat when possible. If the headache does not get better after a few  days or as always if concerned please call the office.

## 2024-05-29 NOTE — Progress Notes (Signed)
 Pain Scale   Average Pain 7 Patient advising she has lower back pain, and pain increases when walking and decreases when sitting        +Driver, -BT, -Dye Allergies.

## 2024-06-02 ENCOUNTER — Ambulatory Visit: Admitting: Surgical

## 2024-06-04 NOTE — Procedures (Signed)
 Lumbosacral Transforaminal Epidural Steroid Injection - Sub-Pedicular Approach with Fluoroscopic Guidance  Patient: Jenny [name]      Date of Birth: [birth year] MRN: [id number] PCP: Mahlon Comer BRAVO, MD      Visit Date: 05/29/2024   Universal Protocol:    Date/Time: 05/29/2024  Consent Given By: the patient  Position: PRONE  Additional Comments: Vital signs were monitored before and after the procedure. Patient was prepped and draped in the usual sterile fashion. The correct patient, procedure, and site was verified.   Injection Procedure Details:   Procedure diagnoses: Lumbar radiculopathy [M54.16]    Meds Administered:  Meds ordered this encounter  Medications   methylPREDNISolone  acetate (DEPO-MEDROL ) injection 40 mg    Laterality: Left  Location/Site: L3  Needle:5.0 in., 22 ga.  Short bevel or Quincke spinal needle  Needle Placement: Transforaminal  Findings:    -Comments: Excellent flow of contrast along the nerve, nerve root and into the epidural space.  Procedure Details: After squaring off the end-plates to get a true AP view, the C-arm was positioned so that an oblique view of the foramen as noted above was visualized. The target area is just inferior to the nose of the scotty dog or sub pedicular. The soft tissues overlying this structure were infiltrated with 2-3 ml. of 1% Lidocaine  without Epinephrine .  The spinal needle was inserted toward the target using a trajectory view along the fluoroscope beam.  Under AP and lateral visualization, the needle was advanced so it did not puncture dura and was located close the 6 O'Clock position of the pedical in AP tracterory. Biplanar projections were used to confirm position. Aspiration was confirmed to be negative for CSF and/or blood. A 1-2 ml. volume of Isovue -250 was injected and flow of contrast was noted at each level. Radiographs were obtained for documentation purposes.   After attaining the desired  flow of contrast documented above, a 0.5 to 1.0 ml test dose of 0.25% Marcaine  was injected into each respective transforaminal space.  The patient was observed for 90 seconds post injection.  After no sensory deficits were reported, and normal lower extremity motor function was noted,   the above injectate was administered so that equal amounts of the injectate were placed at each foramen (level) into the transforaminal epidural space.   Additional Comments:  The patient tolerated the procedure well Dressing: 2 x 2 sterile gauze and Band-Aid    Post-procedure details: Patient was observed during the procedure. Post-procedure instructions were reviewed.  Patient left the clinic in stable condition.

## 2024-06-04 NOTE — Progress Notes (Signed)
 Jenny [name] - 83 y.o. female MRN [id number]  Date of birth: 02/07/1941  Office Visit Note: Visit Date: 05/29/2024 PCP: Mahlon Comer BRAVO, MD Referred by: Mahlon Comer BRAVO, MD  Subjective: Chief Complaint  Patient presents with   Lower Back - Pain   HPI:  Jenny [name] is a 83 y.o. female who comes in today at the request of Duwaine Pouch, FNP for planned Left L3-4 Lumbar Transforaminal epidural steroid injection with fluoroscopic guidance.  The patient has failed conservative care including home exercise, medications, time and activity modification.  This injection will be diagnostic and hopefully therapeutic.  Please see requesting physician notes for further details and justification.   ROS Otherwise per HPI.  Assessment & Plan: Visit Diagnoses:    ICD-10-CM   1. Lumbar radiculopathy  M54.16 XR C-ARM NO REPORT    Epidural Steroid injection    methylPREDNISolone  acetate (DEPO-MEDROL ) injection 40 mg      Plan: No additional findings.   Meds & Orders:  Meds ordered this encounter  Medications   methylPREDNISolone  acetate (DEPO-MEDROL ) injection 40 mg    Orders Placed This Encounter  Procedures   XR C-ARM NO REPORT   Epidural Steroid injection    Follow-up: Return for visit to requesting provider as needed.   Procedures: No procedures performed  Lumbosacral Transforaminal Epidural Steroid Injection - Sub-Pedicular Approach with Fluoroscopic Guidance  Patient: Jenny [name]      Date of Birth: 1941-08-19 MRN: [id number] PCP: Mahlon Comer BRAVO, MD      Visit Date: 05/29/2024   Universal Protocol:    Date/Time: 05/29/2024  Consent Given By: the patient  Position: PRONE  Additional Comments: Vital signs were monitored before and after the procedure. Patient was prepped and draped in the usual sterile fashion. The correct patient, procedure, and site was verified.   Injection Procedure Details:   Procedure diagnoses: Lumbar radiculopathy  [M54.16]    Meds Administered:  Meds ordered this encounter  Medications   methylPREDNISolone  acetate (DEPO-MEDROL ) injection 40 mg    Laterality: Left  Location/Site: L3  Needle:5.0 in., 22 ga.  Short bevel or Quincke spinal needle  Needle Placement: Transforaminal  Findings:    -Comments: Excellent flow of contrast along the nerve, nerve root and into the epidural space.  Procedure Details: After squaring off the end-plates to get a true AP view, the C-arm was positioned so that an oblique view of the foramen as noted above was visualized. The target area is just inferior to the nose of the scotty dog or sub pedicular. The soft tissues overlying this structure were infiltrated with 2-3 ml. of 1% Lidocaine  without Epinephrine .  The spinal needle was inserted toward the target using a trajectory view along the fluoroscope beam.  Under AP and lateral visualization, the needle was advanced so it did not puncture dura and was located close the 6 O'Clock position of the pedical in AP tracterory. Biplanar projections were used to confirm position. Aspiration was confirmed to be negative for CSF and/or blood. A 1-2 ml. volume of Isovue -250 was injected and flow of contrast was noted at each level. Radiographs were obtained for documentation purposes.   After attaining the desired flow of contrast documented above, a 0.5 to 1.0 ml test dose of 0.25% Marcaine  was injected into each respective transforaminal space.  The patient was observed for 90 seconds post injection.  After no sensory deficits were reported, and normal lower extremity motor function was noted,   the above injectate  was administered so that equal amounts of the injectate were placed at each foramen (level) into the transforaminal epidural space.   Additional Comments:  The patient tolerated the procedure well Dressing: 2 x 2 sterile gauze and Band-Aid    Post-procedure details: Patient was observed during the  procedure. Post-procedure instructions were reviewed.  Patient left the clinic in stable condition.    Clinical History: EXAM DESCRIPTION: MR LUMBAR SPINE WO CONTRAST   CLINICAL HISTORY: Low back pain, symptoms persist with > 6 wks treatment; spinal stenosis, left leg radiculopathy   COMPARISON: None Available.   TECHNIQUE: MRI of the lumbar spine is performed according to our usual protocol with axial and sagittal multi sequence imaging.   FINDINGS: The alignment is unremarkable. Laminectomy and posterior fusion of L4-S1. Prosthetic disc spacers. 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 No significant endplate marrow edema. The marrow signal is unremarkable as well as the conus and cauda equina.   L2-3 has moderate central stenosis and moderate bilateral foraminal narrowing due to broad-based disc bulge and facet/ligament flavum hypertrophy.   L3-4 has moderate to severe bilateral foraminal narrowing and severe central stenosis due to broad-based disc bulge and facet/ligament flavum hypertrophy.   L4-5 has mild foraminal narrowing the left due to broad-based disc bulge and facet hypertrophy.   L5-S1 has mild bilateral foraminal and lateral recess narrowing due to broad-based disc bulge and facet/ligament flavum hypertrophy.   There is a relatively simple appearing fluid collection measuring up to 2.8 cm posterior to the thecal sac at the L4-5 level. This is likely postoperative seroma.   Benign-appearing renal cysts. The imaged retroperitoneal structures are otherwise unremarkable.     IMPRESSION: Multilevel degenerative spondylosis with levels described in detail above.   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   L2-3 has a moderate degenerative central stenosis.   Simple appearing fluid collection posterior to the thecal sac at the L4-5 level measuring up to  2.8 cm. This is likely chronic postoperative seroma. Aspiration if clinically warranted.     Electronically signed by: Reyes Frees MD 04/06/2024 11:58     Objective:  VS:  HT:    WT:   BMI:     BP:118/71  HR:61bpm  TEMP: ( )  RESP:  Physical Exam Vitals and nursing note reviewed.  Constitutional:      General: She is not in acute distress.    Appearance: Normal appearance. She is obese. She is not ill-appearing.  HENT:     Head: Normocephalic and atraumatic.     Right Ear: External ear normal.     Left Ear: External ear normal.  Eyes:     Extraocular Movements: Extraocular movements intact.  Cardiovascular:     Rate and Rhythm: Normal rate.     Pulses: Normal pulses.  Pulmonary:     Effort: Pulmonary effort is normal. No respiratory distress.  Abdominal:     General: There is no distension.     Palpations: Abdomen is soft.  Musculoskeletal:        General: Tenderness present.     Cervical back: Neck supple.     Right lower leg: No edema.     Left lower leg: No edema.     Comments: Patient has good distal strength with no pain over the greater trochanters.  No clonus or focal weakness.  Skin:    Findings: No erythema, lesion or rash.  Neurological:  General: No focal deficit present.     Mental Status: She is alert and oriented to person, place, and time.     Sensory: No sensory deficit.     Motor: No weakness or abnormal muscle tone.     Coordination: Coordination normal.  Psychiatric:        Mood and Affect: Mood normal.        Behavior: Behavior normal.      Imaging: No results found.

## 2024-06-05 ENCOUNTER — Other Ambulatory Visit: Payer: Self-pay

## 2024-06-05 NOTE — Progress Notes (Signed)
 Specialty Pharmacy Refill Coordination Note  Jenny [name] is a 83 y.o. female contacted today regarding refills of specialty medication(s) Dupilumab  (Dupixent )   Patient requested Delivery   Delivery date: 06/14/24   Verified address: [address] [place]  [place]   Medication will be filled on 09.02.25.

## 2024-06-09 ENCOUNTER — Other Ambulatory Visit (HOSPITAL_COMMUNITY): Payer: Self-pay

## 2024-06-13 ENCOUNTER — Ambulatory Visit (INDEPENDENT_AMBULATORY_CARE_PROVIDER_SITE_OTHER)

## 2024-06-13 ENCOUNTER — Encounter: Payer: Self-pay | Admitting: Podiatry

## 2024-06-13 ENCOUNTER — Other Ambulatory Visit: Payer: Self-pay | Admitting: Family Medicine

## 2024-06-13 ENCOUNTER — Other Ambulatory Visit: Payer: Self-pay | Admitting: Orthopedic Surgery

## 2024-06-13 DIAGNOSIS — E538 Deficiency of other specified B group vitamins: Secondary | ICD-10-CM | POA: Diagnosis not present

## 2024-06-13 MED ORDER — CYANOCOBALAMIN 1000 MCG/ML IJ SOLN
1000.0000 ug | Freq: Once | INTRAMUSCULAR | Status: AC
  Administered 2024-06-13: 1000 ug via INTRAMUSCULAR

## 2024-06-13 NOTE — Progress Notes (Signed)
 Jenny [name] is a 83 y.o. female presents to the office today for b12 Injection per physician's orders. Injection was administered Intramuscular Left deltoid.   Patient's next injection due 07/17/2024, appt made? yes  [facility] R Helina Hullum

## 2024-06-14 ENCOUNTER — Ambulatory Visit

## 2024-06-15 ENCOUNTER — Ambulatory Visit: Admitting: Podiatry

## 2024-06-15 ENCOUNTER — Encounter: Payer: Self-pay | Admitting: Podiatry

## 2024-06-15 VITALS — Ht 64.0 in | Wt 210.4 lb

## 2024-06-15 DIAGNOSIS — M79675 Pain in left toe(s): Secondary | ICD-10-CM

## 2024-06-15 DIAGNOSIS — M79674 Pain in right toe(s): Secondary | ICD-10-CM | POA: Diagnosis not present

## 2024-06-15 DIAGNOSIS — B351 Tinea unguium: Secondary | ICD-10-CM | POA: Diagnosis not present

## 2024-06-15 DIAGNOSIS — G629 Polyneuropathy, unspecified: Secondary | ICD-10-CM | POA: Diagnosis not present

## 2024-06-15 DIAGNOSIS — Q828 Other specified congenital malformations of skin: Secondary | ICD-10-CM | POA: Diagnosis not present

## 2024-06-15 NOTE — Progress Notes (Signed)
 Subjective: Chief Complaint  Patient presents with   Nail Problem    Pt is here for toenail trim and callous removal.   83 year old female presents the office today for concerns of calluses to her left foot that is causing pain, mostly left foot submetatarsal 5.  Nails are also thickened elongated she cannot trim herself and they cause pain.  No open lesions or injuries to her feet.   Objective: AAO x3, NAD DP/PT pulses palpable bilaterally, CRT less than 3 seconds Hyperkeratotic lesions left foot submetatarsal 1, 5 without any underlying ulceration drainage or signs of infection.   Prominent metatarsal heads as well as along the fifth MTPJ on the left side where she has majority discomfort. Nails are mildly hypertrophic, dystrophic and elongated causing discomfort x 10.  No edema, erythema or signs of infection. No pain with calf compression, swelling, warmth, erythema  Assessment: Hyperkeratotic lesions left foot; neuropathy; symptomatic onychomycosis  Plan: -All treatment options discussed with the patient including all alternatives, risks, complications.  -Sharply debrided nails x 10 without any complications or bleeding - Sharply debrided hyperkeratotic lesion x 2 without any complications or bleeding. - Discussed moisturizer, offloading with calluses.  Return in about 3 months (around 09/14/2024).  Jenny [name] DPM

## 2024-06-16 ENCOUNTER — Other Ambulatory Visit: Payer: Self-pay

## 2024-06-16 DIAGNOSIS — M1712 Unilateral primary osteoarthritis, left knee: Secondary | ICD-10-CM

## 2024-06-19 ENCOUNTER — Ambulatory Visit: Admitting: Orthopedic Surgery

## 2024-06-19 DIAGNOSIS — M1712 Unilateral primary osteoarthritis, left knee: Secondary | ICD-10-CM | POA: Diagnosis not present

## 2024-06-21 ENCOUNTER — Encounter: Payer: Self-pay | Admitting: Orthopedic Surgery

## 2024-06-21 MED ORDER — LIDOCAINE HCL 1 % IJ SOLN
5.0000 mL | INTRAMUSCULAR | Status: AC | PRN
  Administered 2024-06-19: 5 mL

## 2024-06-21 MED ORDER — HYALURONAN 88 MG/4ML IX SOSY
88.0000 mg | PREFILLED_SYRINGE | INTRA_ARTICULAR | Status: AC | PRN
  Administered 2024-06-19: 88 mg via INTRA_ARTICULAR

## 2024-06-21 NOTE — Progress Notes (Signed)
   Procedure Note  Patient: Jenny [name]             Date of Birth: 14-Aug-1941           MRN: [id number]             Visit Date: 06/19/2024  Procedures: Visit Diagnoses:  1. Unilateral primary osteoarthritis, left knee     Large Joint Inj: L knee on 06/19/2024 10:19 PM Indications: pain, joint swelling and diagnostic evaluation Details: 18 G 1.5 in needle, superolateral approach  Arthrogram: No  Medications: 5 mL lidocaine  1 %; 88 mg Hyaluronan 88 MG/4ML Outcome: tolerated well, no immediate complications Procedure, treatment alternatives, risks and benefits explained, specific risks discussed. Consent was given by the patient. Immediately prior to procedure a time out was called to verify the correct patient, procedure, equipment, support staff and site/side marked as required. Patient was prepped and draped in the usual sterile fashion.     Lot #[id number]

## 2024-06-22 ENCOUNTER — Other Ambulatory Visit: Payer: Self-pay

## 2024-06-23 ENCOUNTER — Encounter: Payer: Self-pay | Admitting: Family Medicine

## 2024-06-23 MED ORDER — ALPRAZOLAM 0.5 MG PO TABS: ORAL_TABLET | ORAL | 1 refills | Status: DC

## 2024-06-23 NOTE — Telephone Encounter (Signed)
 Requested Prescriptions   Pending Prescriptions Disp Refills   ALPRAZolam  (XANAX ) 0.5 MG tablet 90 tablet 1    Sig: TAKE 1/2 TO 1 TABLET BY MOUTH 3 TIMES DAILY AS NEEDED FOR ANXIETY     Date of patient request: 06/23/24 Last office visit: 05/15/2024 Upcoming visit: Visit date not found Date of last refill: 02/22/24 Last refill amount: 90 tablets

## 2024-07-03 ENCOUNTER — Other Ambulatory Visit: Payer: Self-pay | Admitting: Pulmonary Disease

## 2024-07-03 ENCOUNTER — Other Ambulatory Visit: Payer: Self-pay

## 2024-07-03 DIAGNOSIS — J4550 Severe persistent asthma, uncomplicated: Secondary | ICD-10-CM

## 2024-07-03 DIAGNOSIS — R3911 Hesitancy of micturition: Secondary | ICD-10-CM | POA: Diagnosis not present

## 2024-07-03 NOTE — Telephone Encounter (Signed)
 Dupixent

## 2024-07-04 ENCOUNTER — Other Ambulatory Visit: Payer: Self-pay

## 2024-07-04 ENCOUNTER — Encounter (INDEPENDENT_AMBULATORY_CARE_PROVIDER_SITE_OTHER): Payer: Self-pay

## 2024-07-04 MED ORDER — DUPIXENT 300 MG/2ML ~~LOC~~ SOAJ
300.0000 mg | SUBCUTANEOUS | 1 refills | Status: AC
  Filled 2024-07-04 – 2024-07-05 (×2): qty 12, 84d supply, fill #0
  Filled 2024-09-20: qty 12, 84d supply, fill #1

## 2024-07-04 NOTE — Telephone Encounter (Signed)
 Refill sent for DUPIXENT  to [facility]: [phone]   Dose: 300mg  [place] every 14 days   Last OV: 01/20/24 Provider: Dr. Jude  Next OV: 07/27/24  Aleck Puls, PharmD, BCPS Clinical Pharmacist  [facility]

## 2024-07-05 ENCOUNTER — Other Ambulatory Visit: Payer: Self-pay

## 2024-07-05 NOTE — Progress Notes (Signed)
 Specialty Pharmacy Refill Coordination Note  Jenny [name] is a 83 y.o. female contacted today regarding refills of specialty medication(s) Dupilumab  (Dupixent )   Patient requested (Patient-Rptd) Delivery   Delivery date: 07/07/24   Verified address: (Patient-Rptd) [address] [place],  [address]   Medication will be filled on 07/06/24.

## 2024-07-12 ENCOUNTER — Other Ambulatory Visit: Payer: Self-pay | Admitting: Orthopedic Surgery

## 2024-07-12 NOTE — Telephone Encounter (Signed)
 Yes  thx

## 2024-07-17 ENCOUNTER — Ambulatory Visit

## 2024-07-17 DIAGNOSIS — E538 Deficiency of other specified B group vitamins: Secondary | ICD-10-CM | POA: Diagnosis not present

## 2024-07-17 MED ORDER — CYANOCOBALAMIN 1000 MCG/ML IJ SOLN
1000.0000 ug | Freq: Once | INTRAMUSCULAR | Status: AC
  Administered 2024-07-17: 1000 ug via INTRAMUSCULAR

## 2024-07-17 NOTE — Progress Notes (Signed)
 Jenny [name] is a 83 y.o. female presents to the office today for monthly B12 injection per physician's orders. Injection was administered Intramuscular Left deltoid.   Patient's next injection due 1 month, appt made? no  Alfredo DELENA Shope

## 2024-07-20 ENCOUNTER — Ambulatory Visit: Payer: Self-pay

## 2024-07-20 NOTE — Telephone Encounter (Signed)
 FYI Only or Action Required?: FYI only for provider.  Patient was last seen in primary care on 05/15/2024 by Mahlon Comer BRAVO, MD.  Called Nurse Triage reporting Fatigue.  Symptoms began a week ago.  Interventions attempted: Rest, hydration, or home remedies.  Symptoms are: gradually worsening.  Triage Disposition: See Physician Within 24 Hours  Patient/caregiver understands and will follow disposition?: Yes    Copied from CRM #[id number]. Topic: Clinical - Red Word Triage >> Jul 20, 2024 12:03 PM [place] E wrote: [facility] that prompted transfer to Nurse Triage: Fatigue going on for a while, but worsening over this past week. Patient stated she thinks it may be dehydration. Reason for Disposition  Taking a medicine that could cause weakness (e.g., blood pressure medications, diuretics)  Answer Assessment - Initial Assessment Questions 1. DESCRIPTION: Describe how you are feeling.     Tired, could just fall asleep.  2. SEVERITY: How bad is it?  Can you stand and walk?     Fatigue. Pushes her self to do daily activities  3. ONSET: When did these symptoms begin? (e.g., hours, days, weeks, months)     6 months ago, worse for one week.  4. CAUSE: What do you think is causing the weakness or fatigue? (e.g., not drinking enough fluids, medical problem, trouble sleeping)     Unsure- she does know she does not drink enough fluids, she is increasing her intake 5. NEW MEDICINES:  Have you started on any new medicines recently? (e.g., opioid pain medicines, benzodiazepines, muscle relaxants, antidepressants, antihistamines, neuroleptics, beta blockers)     no 6. OTHER SYMPTOMS: Do you have any other symptoms? (e.g., chest pain, fever, cough, SOB, vomiting, diarrhea, bleeding, other areas of pain)     denies 7. PREGNANCY: Is there any chance you are pregnant? When was your last menstrual period?  Protocols used: Weakness (Generalized) and Fatigue-A-AH

## 2024-07-20 NOTE — Telephone Encounter (Signed)
 FYI

## 2024-07-21 ENCOUNTER — Encounter: Payer: Self-pay | Admitting: Family Medicine

## 2024-07-21 ENCOUNTER — Ambulatory Visit: Admitting: Family Medicine

## 2024-07-21 ENCOUNTER — Telehealth: Payer: Self-pay

## 2024-07-21 ENCOUNTER — Other Ambulatory Visit (HOSPITAL_COMMUNITY): Payer: Self-pay

## 2024-07-21 VITALS — BP 110/60 | HR 58 | Temp 97.7°F | Ht 64.0 in | Wt 200.6 lb

## 2024-07-21 DIAGNOSIS — R5383 Other fatigue: Secondary | ICD-10-CM

## 2024-07-21 DIAGNOSIS — R600 Localized edema: Secondary | ICD-10-CM

## 2024-07-21 LAB — CBC WITH DIFFERENTIAL/PLATELET
Basophils Absolute: 0.1 K/uL (ref 0.0–0.1)
Basophils Relative: 1 % (ref 0.0–3.0)
Eosinophils Absolute: 0.3 K/uL (ref 0.0–0.7)
Eosinophils Relative: 4 % (ref 0.0–5.0)
HCT: 42.8 % (ref 36.0–46.0)
Hemoglobin: 13.7 g/dL (ref 12.0–15.0)
Lymphocytes Relative: 22.8 % (ref 12.0–46.0)
Lymphs Abs: 1.8 K/uL (ref 0.7–4.0)
MCHC: 32.1 g/dL (ref 30.0–36.0)
MCV: 91.6 fl (ref 78.0–100.0)
Monocytes Absolute: 0.7 K/uL (ref 0.1–1.0)
Monocytes Relative: 9.2 % (ref 3.0–12.0)
Neutro Abs: 5.1 K/uL (ref 1.4–7.7)
Neutrophils Relative %: 63 % (ref 43.0–77.0)
Platelets: 268 K/uL (ref 150.0–400.0)
RBC: 4.67 Mil/uL (ref 3.87–5.11)
RDW: 14 % (ref 11.5–15.5)
WBC: 8 K/uL (ref 4.0–10.5)

## 2024-07-21 LAB — HEPATIC FUNCTION PANEL
ALT: 10 U/L (ref 0–35)
AST: 17 U/L (ref 0–37)
Albumin: 3.8 g/dL (ref 3.5–5.2)
Alkaline Phosphatase: 82 U/L (ref 39–117)
Bilirubin, Direct: 0.1 mg/dL (ref 0.0–0.3)
Total Bilirubin: 0.4 mg/dL (ref 0.2–1.2)
Total Protein: 6.5 g/dL (ref 6.0–8.3)

## 2024-07-21 LAB — BASIC METABOLIC PANEL WITH GFR
BUN: 18 mg/dL (ref 6–23)
CO2: 30 meq/L (ref 19–32)
Calcium: 8.9 mg/dL (ref 8.4–10.5)
Chloride: 105 meq/L (ref 96–112)
Creatinine, Ser: 0.87 mg/dL (ref 0.40–1.20)
GFR: 60.64 mL/min (ref >60.00)
Glucose, Bld: 115 mg/dL — ABNORMAL HIGH (ref 70–99)
Potassium: 3.9 meq/L (ref 3.5–5.1)
Sodium: 141 meq/L (ref 135–145)

## 2024-07-21 LAB — TSH: TSH: 0.19 u[IU]/mL — ABNORMAL LOW (ref 0.35–5.50)

## 2024-07-21 LAB — VITAMIN D 25 HYDROXY (VIT D DEFICIENCY, FRACTURES): VITD: 24.93 ng/mL — ABNORMAL LOW (ref 30.00–100.00)

## 2024-07-21 LAB — BRAIN NATRIURETIC PEPTIDE: Pro B Natriuretic peptide (BNP): 167 pg/mL — ABNORMAL HIGH (ref 0.0–100.0)

## 2024-07-21 LAB — B12 AND FOLATE PANEL
Folate: 15.1 ng/mL (ref >5.9)
Vitamin B-12: 682 pg/mL (ref 211–911)

## 2024-07-21 MED ORDER — ONDANSETRON HCL 4 MG PO TABS: 4.0000 mg | ORAL_TABLET | Freq: Three times a day (TID) | ORAL | 0 refills | Status: AC | PRN

## 2024-07-21 NOTE — Telephone Encounter (Signed)
 Pharmacy Patient Advocate Encounter  Received notification from [facility] [employer] that Prior Authorization for Ondansetron HCl 4MG  tablets  has been DENIED.  Full denial letter will be uploaded to the media tab. See denial reason below.

## 2024-07-21 NOTE — Patient Instructions (Signed)
 Follow up as needed or as scheduled We'll notify you of your lab results and make any changes if needed Start taking the Lasix  (Furosemide ) every day Take the reflux medicine every day Use the Ondansetron  (Zofran ) as needed for nausea Make sure you are eating regularly, drinking your water, and resting when you need to! Call with any questions or concerns Hang in there!!

## 2024-07-21 NOTE — Telephone Encounter (Signed)
 Please see note from Dr. Mahlon, thank you

## 2024-07-21 NOTE — Progress Notes (Signed)
   Subjective:    Patient ID: Jenny [name], female    DOB: 22-Oct-1940, 83 y.o.   MRN: [id number]  HPI Fatigue- pt is caring for her 2.5 yo great granddaughter who has CP.  Admits she doesn't drink enough during the day.  Pt is not sleeping well.  Pt saw the Nephrologist and Pulmonolgist and was noted to have bilaterally LE edema.  Had normal ECHO in July 2023.  Pt reports she is having daily swelling and 'it feels like pins and needles' when walking.  Not taking Lasix  daily.  Denies CP, SOB above baseline.  Some nausea.  Only taking reflux medication PRN.  No abd pain.     Review of Systems For ROS see HPI     Objective:   Physical Exam Vitals reviewed.  Constitutional:      General: She is not in acute distress.    Appearance: Normal appearance. She is well-developed. She is obese. She is not ill-appearing.  HENT:     Head: Normocephalic and atraumatic.  Eyes:     Conjunctiva/sclera: Conjunctivae normal.     Pupils: Pupils are equal, round, and reactive to light.  Neck:     Thyroid : No thyromegaly.  Cardiovascular:     Rate and Rhythm: Normal rate and regular rhythm.     Pulses: Normal pulses.     Heart sounds: Normal heart sounds. No murmur heard. Pulmonary:     Effort: Pulmonary effort is normal. No respiratory distress.     Breath sounds: Normal breath sounds.  Abdominal:     General: There is no distension.     Palpations: Abdomen is soft.     Tenderness: There is no abdominal tenderness.  Musculoskeletal:     Cervical back: Normal range of motion and neck supple.     Right lower leg: Edema (trace) present.     Left lower leg: Edema (trace) present.  Lymphadenopathy:     Cervical: No cervical adenopathy.  Skin:    General: Skin is warm and dry.  Neurological:     General: No focal deficit present.     Mental Status: She is alert and oriented to person, place, and time.  Psychiatric:        Mood and Affect: Mood normal.        Behavior: Behavior normal.         Thought Content: Thought content normal.           Assessment & Plan:  Fatigue- new.  Suspect this is multifactorial- age, emotional fatigue, multiple medical issues.  Will check labs to assess for underlying cause.  Due to LE edema will get BNP to determine if repeat ECHO is needed and if this is possibly contributing to her fatigue.  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  Will continue to follow.  Bilateral LE edema- new.  Today in office swelling is minimal but she reports it was more noticeable when she went to Pulmonary and Nephrology.  She is not taking Lasix  daily.  Encouraged her to do so.  Will check BNP and decide if repeat ECHO is needed.  Pt expressed understanding and is in agreement w/ plan.

## 2024-07-21 NOTE — Telephone Encounter (Signed)
 It's not urgent.  She's having episodes of nausea that may be related to poorly controlled reflux.  She is going to start her PPI regularly and she will only need the Zofran  prn.  We can wait for the prior auth

## 2024-07-21 NOTE — Telephone Encounter (Signed)
 Pharmacy Patient Advocate Encounter   Received notification from CoverMyMeds that prior authorization for Ondansetron  HCl 4MG  tablets  is required/requested.   Insurance verification completed.   The patient is insured through [facility] [employer]ANT[employer]/RX ADVANCE.   Per test claim: PA required; PA submitted to above mentioned insurance via Latent Key/confirmation #/[id number] [facility] Status is pending

## 2024-07-24 ENCOUNTER — Ambulatory Visit: Payer: Self-pay | Admitting: Family Medicine

## 2024-07-24 DIAGNOSIS — E039 Hypothyroidism, unspecified: Secondary | ICD-10-CM

## 2024-07-24 DIAGNOSIS — E559 Vitamin D deficiency, unspecified: Secondary | ICD-10-CM

## 2024-07-24 MED ORDER — LEVOTHYROXINE SODIUM 112 MCG PO TABS: 112.0000 ug | ORAL_TABLET | Freq: Every day | ORAL | 3 refills | Status: AC

## 2024-07-24 MED ORDER — VITAMIN D (ERGOCALCIFEROL) 1.25 MG (50000 UNIT) PO CAPS: 50000.0000 [IU] | ORAL_CAPSULE | ORAL | 0 refills | Status: AC

## 2024-07-24 NOTE — Telephone Encounter (Signed)
 Patient verbalized lab results.  Scheduled lab visit  Ordered future lab  Sent in vitamin D  and levothyroxine .

## 2024-07-24 NOTE — Telephone Encounter (Signed)
 Called patient and verbalized understanding. She will call us  back and let us  know.

## 2024-07-24 NOTE — Telephone Encounter (Signed)
 PA was denied. Alternative?

## 2024-07-24 NOTE — Telephone Encounter (Signed)
 Please let her know that the nausea meds were denied by insurance.  She should let me know if her nausea continues and at that time, we can try and appeal if needed

## 2024-07-27 ENCOUNTER — Encounter (HOSPITAL_BASED_OUTPATIENT_CLINIC_OR_DEPARTMENT_OTHER): Payer: Self-pay | Admitting: Pulmonary Disease

## 2024-07-27 ENCOUNTER — Ambulatory Visit (HOSPITAL_BASED_OUTPATIENT_CLINIC_OR_DEPARTMENT_OTHER): Admitting: Pulmonary Disease

## 2024-07-27 VITALS — BP 121/73 | HR 77 | Ht 64.0 in | Wt 199.5 lb

## 2024-07-27 DIAGNOSIS — G4733 Obstructive sleep apnea (adult) (pediatric): Secondary | ICD-10-CM | POA: Diagnosis not present

## 2024-07-27 DIAGNOSIS — J4550 Severe persistent asthma, uncomplicated: Secondary | ICD-10-CM | POA: Diagnosis not present

## 2024-07-27 DIAGNOSIS — J849 Interstitial pulmonary disease, unspecified: Secondary | ICD-10-CM | POA: Diagnosis not present

## 2024-07-27 NOTE — Progress Notes (Signed)
 Subjective:    Patient ID: Jenny [name], female    DOB: 27-Dec-1940, 83 y.o.   MRN: [id number]   83 y.o.  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 She has untreated OSA , she did not start on CPAP suggested on initial OV  Discussed the use of AI scribe software for clinical note transcription with the patient, who gave verbal consent to proceed.  History of Present Illness Jenny [name] is an 83 year old female with asthma and interstitial lung disease who presents for follow-up of her respiratory conditions.  She manages her asthma with Dupixent  injections and Symbicort  once daily. Recently, she experienced a flare-up with congestion and cough, using mucus relief and her inhaler. She used her inhaler last weekend and at the beginning of the week due to congestion, which caused wheezing. She does not experience shortness of breath.  She has not been using her CPAP machine for sleep apnea due to difficulty sleeping and dry mouth. She has been staying indoors for the past six months to care for her grandchild with cerebral palsy.    Significant tests/ events reviewed   Spirometry 04/21/18 >> FEV1 1.3 (64%), FEV1% 83 IgE 01/30/20 >> 40, Absolute eosinophils 600 PFT 04/17/20 >> FEV1 1.30 (65%), FEV1% 79, TLC 6.97 (133%), RV 5.11 (207%), DLCO 88% Serology 04/17/20 >> ANA, HP panel, anti CCP negative; RF 25   Imaging : HRCT chest 11/2023 >> unchanged fibrotic NSIP HRCT chest 02/09/20 >> atherosclerosis, 3 mm  nodule LLL, mild patchy subpleural reticulation and GGO b/l with mild basilar predominance HRCT chest 02/04/21 >> centrilobular and paraseptal emphysema; mild peripheral and basilar predominant subpleural reticulation and GGO slight increase from April 2021, minimal traction BTX (probable UIP pattern)   Sleep : PSG April 2008 >> AHI 29, SpO2 low 62%    Review of Systems  neg for any significant sore throat, dysphagia, itching, sneezing, nasal congestion or excess/ purulent secretions, fever, chills, sweats, unintended wt loss, pleuritic or exertional cp, hempoptysis, orthopnea pnd or change in chronic leg swelling. Also denies presyncope, palpitations, heartburn, abdominal pain, nausea, vomiting, diarrhea or change in bowel or urinary habits, dysuria,hematuria, rash, arthralgias, visual complaints, headache, numbness weakness or ataxia.      Objective:   Physical Exam  Gen. Pleasant, obese, in no distress ENT - no lesions, no post nasal drip Neck: No JVD, no thyromegaly, no carotid bruits Lungs: no use of accessory muscles, no dullness to percussion, decreased without rales or rhonchi  Cardiovascular: Rhythm regular, heart sounds  normal, no murmurs or gallops, no peripheral edema Musculoskeletal: No deformities, no cyanosis or clubbing , no tremors       Assessment & Plan:   Assessment and Plan Assessment & Plan Interstitial lung disease (pulmonary fibrosis) Previous scan was done earlier this year. - Repeat HRCT in 4-5 months to monitor the stability of the pulmonary fibrosis.  Asthma Asthma is managed with Dupixent  and Symbicort . Recent flare-up with congestion and cough was managed with mucus relief and inhaler. Symbicort  is  being used once daily, but it is recommended to be used twice daily for optimal control. - Continue Dupixent  as prescribed. - Increase Symbicort  to twice daily for better asthma control. - Use albuterol  inhaler as needed for acute symptoms.  Obstructive  sleep apnea Obstructive sleep apnea is under evaluation for a new CPAP device. Current machine is outdated, and a new sleep study is required to qualify for a new device. Difficulty sleeping and dry mouth reported, possibly contributing to tiredness. - Order a home sleep test to evaluate current status of obstructive sleep apnea. - If the home sleep test indicates, write a prescription for a new CPAP machine.

## 2024-07-27 NOTE — Patient Instructions (Signed)
  VISIT SUMMARY: Today, we discussed your respiratory conditions, including asthma, interstitial lung disease, and obstructive sleep apnea. We reviewed your current treatments and made some adjustments to better manage your symptoms.  YOUR PLAN: -INTERSTITIAL LUNG DISEASE (PULMONARY FIBROSIS): Pulmonary fibrosis is a condition where the lung tissue becomes scarred and stiff, making it difficult to breathe. We will repeat a high-resolution CT scan in 4-5 months to monitor the stability of your condition.  -ASTHMA: Asthma is a condition where your airways become inflamed and narrow, causing difficulty in breathing. You should continue using Dupixent  as prescribed and increase Symbicort  to twice daily for better control. Use your albuterol  inhaler as needed for acute symptoms.  -OBSTRUCTIVE SLEEP APNEA: Obstructive sleep apnea is a condition where your breathing stops and starts during sleep. We will order a home sleep test to evaluate your current status. If needed, we will prescribe a new CPAP machine based on the test results.  INSTRUCTIONS: Please schedule a high-resolution CT scan in 4-5 months to monitor your pulmonary fibrosis. Also, arrange for a home sleep test to evaluate your obstructive sleep apnea. Follow up with us  after these tests to discuss the results and any further steps.                      Contains text generated by Abridge.                                 Contains text generated by Abridge.

## 2024-08-02 DIAGNOSIS — H16223 Keratoconjunctivitis sicca, not specified as Sjogren's, bilateral: Secondary | ICD-10-CM | POA: Diagnosis not present

## 2024-08-04 ENCOUNTER — Other Ambulatory Visit: Payer: Self-pay | Admitting: Podiatry

## 2024-08-09 ENCOUNTER — Ambulatory Visit

## 2024-08-09 ENCOUNTER — Telehealth: Payer: Self-pay

## 2024-08-09 VITALS — Ht 64.0 in | Wt 199.0 lb

## 2024-08-09 DIAGNOSIS — Z0000 Encounter for general adult medical examination without abnormal findings: Secondary | ICD-10-CM | POA: Diagnosis not present

## 2024-08-09 NOTE — Patient Instructions (Addendum)
 Jenny [name],  Thank you for taking the time for your Medicare Wellness Visit. I appreciate your continued commitment to your health goals. Please review the care plan we discussed, and feel free to reach out if I can assist you further.  Medicare recommends these wellness visits once per year to help you and your care team stay ahead of potential health issues. These visits are designed to focus on prevention, allowing your provider to concentrate on managing your acute and chronic conditions during your regular appointments.  Please note that Annual Wellness Visits do not include a physical exam. Some assessments may be limited, especially if the visit was conducted virtually. If needed, we may recommend a separate in-person follow-up with your provider.  Ongoing Care Seeing your primary care provider every 3 to 6 months helps us  monitor your health and provide consistent, personalized care. Last office visit on 07/21/2024.  Each day, aim for 6 glasses of water, plenty of protein in your diet and try to get up and walk/ stretch every hour for 5-10 minutes at a time.    Referrals If a referral was made during today's visit and you haven't received any updates within two weeks, please contact the referred provider directly to check on the status.  Recommended Screenings:  Health Maintenance  Topic Date Due   Medicare Annual Wellness Visit  08/03/2024   DTaP/Tdap/Td vaccine (3 - Td or Tdap) 05/15/2034   Pneumococcal Vaccine for age over 62  Completed   Flu Shot  Completed   DEXA scan (bone density measurement)  Completed   Meningitis B Vaccine  Aged Out   Breast Cancer Screening  Discontinued   COVID-19 Vaccine  Discontinued   Zoster (Shingles) Vaccine  Discontinued       08/09/2024    2:31 PM  Advanced Directives  Does Patient Have a Medical Advance Directive? Yes  Type of Estate Agent of [place];Living will  Copy of Healthcare Power of Attorney in Chart? No -  copy requested   Advance Care Planning is important because it: Ensures you receive medical care that aligns with your values, goals, and preferences. Provides guidance to your family and loved ones, reducing the emotional burden of decision-making during critical moments.  Vision: Annual vision screenings are recommended for early detection of glaucoma, cataracts, and diabetic retinopathy. These exams can also reveal signs of chronic conditions such as diabetes and high blood pressure.  Dental: Annual dental screenings help detect early signs of oral cancer, gum disease, and other conditions linked to overall health, including heart disease and diabetes.  Please see the attached documents for additional preventive care recommendations.

## 2024-08-09 NOTE — Progress Notes (Signed)
 Subjective:   JOSSALIN CHERVENAK is a 83 y.o. who presents for a Medicare Wellness preventive visit.  As a reminder, Annual Wellness Visits don't include a physical exam, and some assessments may be limited, especially if this visit is performed virtually. We may recommend an in-person follow-up visit with your provider if needed.  Visit Complete: Virtual I connected with  WILLADEEN COLANTUONO on 08/09/24 by a audio enabled telemedicine application and verified that I am speaking with the correct person using two identifiers.  Patient Location: Home  Provider Location: Home Office  I discussed the limitations of evaluation and management by telemedicine. The patient expressed understanding and agreed to proceed.  Vital Signs: Because this visit was a virtual/telehealth visit, some criteria may be missing or patient reported. Any vitals not documented were not able to be obtained and vitals that have been documented are patient reported.  VideoDeclined- This patient declined Librarian, academic. Therefore the visit was completed with audio only.  Persons Participating in Visit: Patient.  AWV Questionnaire: Yes: Patient Medicare AWV questionnaire was completed by the patient on 08/06/2024; I have confirmed that all information answered by patient is correct and no changes since this date.  Cardiac Risk Factors include: advanced age (>74men, >72 women);hypertension;Other (see comment), Risk factor comments: OSA, COPD     Objective:    Today's Vitals   08/09/24 1417  Weight: 199 lb (90.3 kg)  Height: 5' 4 (1.626 m)   Body mass index is 34.16 kg/m.     08/09/2024    2:31 PM 08/04/2023   11:08 AM 07/14/2022   12:53 PM 04/02/2022    9:45 AM 07/04/2021    3:07 PM 09/29/2020    9:32 PM 05/07/2020   10:21 AM  Advanced Directives  Does Patient Have a Medical Advance Directive? Yes Yes Yes No Yes No Yes  Type of Estate Agent of  [place];Living will Healthcare Power of [place];Living will Healthcare Power of [place];Living will  Living will;Healthcare Power of [employer] Power of [place];Living will  Does patient want to make changes to medical advance directive?     Yes (MAU/Ambulatory/Procedural Areas - Information given)    Copy of Healthcare Power of Attorney in Chart? No - copy requested No - copy requested No - copy requested  No - copy requested  No - copy requested  Would patient like information on creating a medical advance directive?    No - Patient declined  No - Patient declined     Current Medications (verified) Outpatient Encounter Medications as of 08/09/2024  Medication Sig   albuterol  (VENTOLIN  HFA) 108 (90 Base) MCG/ACT inhaler INHALE 1-2 PUFFS INTO THE LUNGS EVERY 6 HOURS AS NEEDED.   ALPRAZolam  (XANAX ) 0.5 MG tablet TAKE 1/2 TO 1 TABLET BY MOUTH 3 TIMES DAILY AS NEEDED FOR ANXIETY   amLODipine  (NORVASC ) 5 MG tablet TAKE 1 TABLET BY MOUTH DAILY   budesonide -formoterol  (SYMBICORT ) 80-4.5 MCG/ACT inhaler Inhale 2 puffs into the lungs 2 (two) times daily.   Dupilumab  (DUPIXENT ) 300 MG/2ML SOAJ Inject 300 mg into the skin every 14 (fourteen) days.   ergocalciferol  (VITAMIN D2) 1.25 MG (50000 UT) capsule Take 50,000 Units by mouth once a week.   escitalopram  (LEXAPRO ) 20 MG tablet Take 1 tablet (20 mg total) by mouth daily.   fluticasone  (FLONASE ) 50 MCG/ACT nasal spray PLACE 1 SPRAY INTO BOTH NOSTRILS DAILY.   furosemide  (LASIX ) 20 MG tablet TAKE 1 TABLET BY MOUTH DAILY (Patient taking  differently: Take 20 mg by mouth as needed.)   gabapentin  (NEURONTIN ) 300 MG capsule TAKE 1 CAPSULE BY MOUTH EVERYDAY AT BEDTIME   levothyroxine  (SYNTHROID ) 112 MCG tablet Take 1 tablet (112 mcg total) by mouth daily.   meloxicam  (MOBIC ) 7.5 MG tablet TAKE 1 TABLET BY MOUTH EVERY DAY AS NEEDED FOR PAIN   ondansetron  (ZOFRAN ) 4 MG tablet Take 1 tablet (4 mg total) by mouth every 8 (eight) hours as needed.    pantoprazole  (PROTONIX ) 40 MG tablet TAKE 1 TABLET BY MOUTH EVERY DAY 30 MINUTES BEFORE DINNER   Vitamin D , Ergocalciferol , (DRISDOL ) 1.25 MG (50000 UNIT) CAPS capsule Take 1 capsule (50,000 Units total) by mouth every 7 (seven) days.   [DISCONTINUED] esomeprazole (NEXIUM) 40 MG capsule Take 40 mg by mouth daily before breakfast.     No facility-administered encounter medications on file as of 08/09/2024.    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   Anemia 09/21/2011   Asthma 01/14/2011   Atrophic vaginitis 06/17/2012   BACK PAIN, LUMBAR 08/23/2007   DEGENERATIVE JOINT DISEASE 02/22/2008   Depression with anxiety 03/06/2008   Qualifier: Diagnosis of  By: Christi MD, Glendia HERO    DIVERTICULOSIS OF COLON 02/22/2008   GERD (gastroesophageal reflux disease)    HYPERTENSION 02/22/2008   HYPOTHYROIDISM 08/23/2007   Interstitial lung disease (HCC)    OBESITY 08/23/2007   Peripheral neuropathy 02/12/2011   possibly related to spondylolisthesis per pt   Pneumonia due to COVID-19 virus 09/2020   PONV (postoperative nausea and vomiting)    Sleep apnea    VENOUS INSUFFICIENCY 08/23/2007   VITAMIN D  DEFICIENCY 08/21/2008   Past Surgical History:  Procedure Laterality Date   ABDOMINAL HYSTERECTOMY     took one ovary   AP repair and sling for cystocele  8-04    Dr Chales   APPENDECTOMY     carpal tunel  2008   CHOLECYSTECTOMY     ctr     bilateral   EYE SURGERY     left hand  2010   left thumb, joint removal, CTR   NECK SURGERY     x 2 - discectomy, fusion   SHOULDER ARTHROSCOPY DISTAL CLAVICLE EXCISION AND OPEN ROTATOR CUFF REPAIR     right   TONSILLECTOMY AND ADENOIDECTOMY     TUBAL LIGATION     Family History  Problem Relation Age of Onset   COPD Mother    Pneumonia Mother    Other Mother        CHF   Hypertension Mother     Arthritis Mother        septic knee after replacement   Heart disease Mother        chf   Dementia Father    Arthritis Brother    Asthma Daughter    Colon polyps Daughter    Stroke Maternal Grandmother    Heart disease Maternal Grandmother    Other Maternal Grandfather        CHF   Heart disease Maternal Grandfather        chf   Cancer Paternal Grandmother        stomach?   Other Paternal Grandfather        problems with kidneys   Kidney disease Paternal Grandfather    Anxiety disorder Daughter    Cancer Maternal Aunt  breast   Cancer Maternal Aunt        liver   Colon cancer Neg Hx    Social History   Socioeconomic History   Marital status: Widowed    Spouse name: Not on file   Number of children: Not on file   Years of education: Not on file   Highest education level: GED or equivalent  Occupational History   Occupation: Retired  Tobacco Use   Smoking status: Former    Current packs/day: 0.00    Average packs/day: 1.5 packs/day for 30.0 years (45.0 ttl pk-yrs)    Types: Cigarettes    Start date: 10/12/1961    Quit date: 10/13/1991    Years since quitting: 32.8   Smokeless tobacco: Never  Vaping Use   Vaping status: Never Used  Substance and Sexual Activity   Alcohol use: No    Alcohol/week: 0.0 standard drinks of alcohol   Drug use: No   Sexual activity: Never  Other Topics Concern   Not on file  Social History Narrative   Lives with her grandson and great grandchild/2025   Social Drivers of Health   Financial Resource Strain: Low Risk  (08/06/2024)   Overall Financial Resource Strain (CARDIA)    Difficulty of Paying Living Expenses: Not very hard  Food Insecurity: No Food Insecurity (08/06/2024)   Hunger Vital Sign    Worried About Running Out of Food in the Last Year: Never true    Ran Out of Food in the Last Year: Never true  Transportation Needs: No Transportation Needs (08/06/2024)   PRAPARE - Administrator, Civil Service  (Medical): No    Lack of Transportation (Non-Medical): No  Physical Activity: Inactive (08/06/2024)   Exercise Vital Sign    Days of Exercise per Week: 0 days    Minutes of Exercise per Session: Not on file  Stress: Stress Concern Present (08/06/2024)   [employer] of Occupational Health - Occupational Stress Questionnaire    Feeling of Stress: Rather much  Social Connections: Unknown (08/06/2024)   Social Connection and Isolation Panel    Frequency of Communication with Friends and Family: More than three times a week    Frequency of Social Gatherings with Friends and Family: Once a week    Attends Religious Services: More than 4 times per year    Active Member of [employer] or Organizations: Patient declined    Attends Banker Meetings: Not on file    Marital Status: Widowed    Tobacco Counseling Counseling given: Not Answered    Clinical Intake:  Pre-visit preparation completed: Yes  Pain : No/denies pain     BMI - recorded: 34.16 Nutritional Status: BMI > 30  Obese Diabetes: No  Lab Results  Component Value Date   HGBA1C 5.9 03/04/2023   HGBA1C 6.2 (H) 09/30/2020   HGBA1C 5.8 11/01/2015     How often do you need to have someone help you when you read instructions, pamphlets, or other written materials from your doctor or pharmacy?: 1 - Never  Interpreter Needed?: No  Information entered by :: Trenise Turay, RMA   Activities of Daily Living     08/06/2024    8:46 PM  In your present state of health, do you have any difficulty performing the following activities:  Hearing? 1  Comment Need hearing aides  Vision? 0  Difficulty concentrating or making decisions? 0  Walking or climbing stairs? 1  Dressing or bathing? 0  Doing errands,  shopping? 0  Preparing Food and eating ? N  Using the Toilet? N  In the past six months, have you accidently leaked urine? Y  Do you have problems with loss of bowel control? Y  Managing your Medications? N   Managing your Finances? N  Housekeeping or managing your Housekeeping? N    Patient Care Team: Mahlon Comer BRAVO, MD as PCP - General (Family Medicine) Lavona Agent, MD as PCP - Cardiology (Cardiology) Christi Glendia HERO, MD as Consulting Physician (Pulmonary Disease) Aneita Gwendlyn DASEN, MD (Inactive) as Consulting Physician (Gastroenterology) Verta Royden DASEN, [place] as Consulting Physician (Podiatry) Mat Browning, MD as Consulting Physician (Obstetrics and Gynecology) Nicholaus Sherlean CROME, [facility] (Inactive) (Pharmacist)  I have updated your Care Teams any recent Medical Services you may have received from other providers in the past year.     Assessment:   This is a routine wellness examination for [place].  Hearing/Vision screen Hearing Screening - Comments:: Need hearing aide-per pt Vision Screening - Comments:: Wears eyeglasses/Dr. Barts/[facility]/per pt- up to date   Goals Addressed   None    Depression Screen     08/09/2024    2:34 PM 08/23/2023    1:06 PM 08/04/2023   11:10 AM 04/22/2023    8:23 AM 03/04/2023    9:11 AM 07/14/2022   12:51 PM 04/02/2022    8:34 AM  PHQ 2/9 Scores  PHQ - 2 Score 0 0 2 0 4 0 0  PHQ- 9 Score 3 0 4 0 9  0    Fall Risk     08/06/2024    8:46 PM 02/16/2024    9:08 AM 08/23/2023    1:06 PM 08/04/2023   11:06 AM 04/22/2023    8:23 AM  Fall Risk   Falls in the past year? 1 0 0 1 0  Number falls in past yr: 0 0 0 1 0  Injury with Fall? 0 0 0 0 0  Risk for fall due to :  No Fall Risks No Fall Risks Medication side effect No Fall Risks  Follow up Falls evaluation completed;Falls prevention discussed  Falls evaluation completed Falls evaluation completed;Education provided;Falls prevention discussed Falls evaluation completed    MEDICARE RISK AT HOME:  Medicare Risk at Home Any stairs in or around the home?: (Patient-Rptd) No If so, are there any without handrails?: (Patient-Rptd) No Home free of loose throw rugs in walkways, pet beds,  electrical cords, etc?: (Patient-Rptd) Yes Adequate lighting in your home to reduce risk of falls?: (Patient-Rptd) Yes Life alert?: (Patient-Rptd) No Use of a cane, walker or w/c?: (Patient-Rptd) No Grab bars in the bathroom?: (Patient-Rptd) Yes Shower chair or bench in shower?: (Patient-Rptd) Yes Elevated toilet seat or a handicapped toilet?: (Patient-Rptd) Yes  TIMED UP AND GO:  Was the test performed?  No  Cognitive Function: Declined/Normal: No cognitive concerns noted by patient or family. Patient alert, oriented, able to answer questions appropriately and recall recent events. No signs of memory loss or confusion.        08/04/2023   11:15 AM 07/14/2022   12:53 PM 07/04/2021    3:15 PM 04/01/2020    9:14 AM  6CIT Screen  What Year? 0 points 0 points 0 points 0 points  What month? 0 points 0 points 0 points 0 points  What time? 0 points 0 points 0 points 0 points  Count back from 20 0 points 0 points 0 points 0 points  Months in reverse 0 points 0 points  0 points 0 points  Repeat phrase 0 points 0 points 0 points 0 points  Total Score 0 points 0 points 0 points 0 points    Immunizations Immunization History  Administered Date(s) Administered   Fluad Quad(high Dose 65+) 08/02/2019, 08/05/2020, 07/05/2021, 11/16/2022   Fluad Trivalent(High Dose 65+) 08/23/2023   INFLUENZA, HIGH DOSE SEASONAL PF 10/13/2014, 09/03/2015, 06/03/2017, 09/25/2018, 05/15/2024   Influenza Split 08/20/2011, 09/30/2012   Influenza Whole 08/13/2013   Influenza-Unspecified 07/11/2016, 07/06/2021   Moderna Sars-Covid-2 Vaccination 11/17/2019, 12/24/2019   Pneumococcal Conjugate-13 03/16/2014   Pneumococcal Polysaccharide-23 06/23/2016   Tdap 09/22/2011, 05/15/2024   Zoster Recombinant(Shingrix) 03/16/2014   Zoster, Live 03/16/2014    Screening Tests Health Maintenance  Topic Date Due   Medicare Annual Wellness (AWV)  08/03/2024   DTaP/Tdap/Td (3 - Td or Tdap) 05/15/2034   Pneumococcal Vaccine:  50+ Years  Completed   Influenza Vaccine  Completed   DEXA SCAN  Completed   Meningococcal B Vaccine  Aged Out   Mammogram  Discontinued   COVID-19 Vaccine  Discontinued   Zoster Vaccines- Shingrix  Discontinued    Health Maintenance Items Addressed: See Nurse Notes at the end of this note  Additional Screening:  Vision Screening: Recommended annual ophthalmology exams for early detection of glaucoma and other disorders of the eye. Is the patient up to date with their annual eye exam?  Yes  Who is the provider or what is the name of the office in which the patient attends annual eye exams? [facility]/per pt- up to date.  Dental Screening: Recommended annual dental exams for proper oral hygiene  Community Resource Referral / Chronic Care Management: CRR required this visit?  No   CCM required this visit?  No   Plan:    I have personally reviewed and noted the following in the patient's chart:   Medical and social history Use of alcohol, tobacco or illicit drugs  Current medications and supplements including opioid prescriptions. Patient is not currently taking opioid prescriptions. Functional ability and status Nutritional status Physical activity Advanced directives List of other physicians Hospitalizations, surgeries, and ER visits in previous 12 months Vitals Screenings to include cognitive, depression, and falls Referrals and appointments  In addition, I have reviewed and discussed with patient certain preventive protocols, quality metrics, and best practice recommendations. A written personalized care plan for preventive services as well as general preventive health recommendations were provided to patient.   Darren Caldron L Other Atienza, CMA   08/09/2024   After Visit Summary: (MyChart) Due to this being a telephonic visit, the after visit summary with patients personalized plan was offered to patient via MyChart   Notes: Patient declines getting a Shingrix  vaccine and declines a DEXA.  She is up to date with all other health maintenance. Patient has had some irritation down in her vagina area,x 2 weeks, redness, itchiness and discomfort when urinating.  Patient stated that she has tried Monistat 7 and it has not helped.  Patient is requesting some medication to be sent in to pharmacy.  Patient is ok with a Mychart response or a phone call.  I have sent a telephone encounter to provider.

## 2024-08-09 NOTE — Telephone Encounter (Signed)
 Unclear if this is yeast or UTI but would need appt to be evaluated in office in order to be treated appropriately

## 2024-08-09 NOTE — Telephone Encounter (Signed)
 Patient has had some irritation down in her vagina area,x 2 weeks, redness, itchiness and discomfort when urinating.  Patient stated that she has tried Monistat 7 and it has not helped.  Patient is requesting some medication to be sent in to pharmacy.  Patient is ok with a Mychart response or a phone call.  Please advise.

## 2024-08-10 ENCOUNTER — Ambulatory Visit: Payer: Self-pay

## 2024-08-10 NOTE — Telephone Encounter (Signed)
 Patient is being seen by another provider for concerns.

## 2024-08-10 NOTE — Telephone Encounter (Signed)
 Called patient to make an appointment for concerns, no answer, LM to call back to make an appointment for evaluation.

## 2024-08-10 NOTE — Telephone Encounter (Signed)
 Pt needs an appointment

## 2024-08-10 NOTE — Telephone Encounter (Signed)
 FYI Only or Action Required?: FYI only for provider: appointment scheduled on 08/11/24 at [facility] with Dr Wendolyn.  Patient was last seen in primary care on 07/21/2024 by Mahlon Comer BRAVO, MD.  Called Nurse Triage reporting Dysuria and Vaginal Itching.  Symptoms began several weeks ago.  Interventions attempted: Other: home remedies: tree oil, witch hazel and OTC: monistat.  Symptoms are: vaginal redness, rash with bumps (no blisters), irritation, itching and feels raw causing burning with urination gradually worsening.  Triage Disposition: See Physician Within 24 Hours  Patient/caregiver understands and will follow disposition?: Yes         Copied from CRM [id number]. Topic: Clinical - Red Word Triage >> Aug 10, 2024 10:59 AM Rea BROCKS wrote: Red Word that prompted transfer to Nurse Triage: possible uti or yeast irritating, redness, itchiness, discomfort, burning when urinating rash like in vaginal area Reason for Disposition  Age > 50 years  Answer Assessment - Initial Assessment Questions 1. SEVERITY: How bad is the pain?  (e.g., Scale 1-10; mild, moderate, or severe)     Burning, she states not that bad when asked mild moderate or severe or scale 1-10.  2. FREQUENCY: How many times have you had painful urination today?      Once.  3. PATTERN: Is pain present every time you urinate or just sometimes?      She states the painful or burning is more related to the vaginal discomfort/rawness.  4. ONSET: When did the painful urination start?      2 weeks.  5. FEVER: Do you have a fever? If Yes, ask: What is your temperature, how was it measured, and when did it start?     No.  6. PAST UTI: Have you had a urine infection before? If Yes, ask: When was the last time? and What happened that time?      No.  7. CAUSE: What do you think is causing the painful urination?  (e.g., UTI, scratch, Herpes sore)     UTI or yeast infection.  8. OTHER  SYMPTOMS: Do you have any other symptoms? (e.g., blood in urine, flank pain, genital sores, urgency, vaginal discharge)     Denies vaginal discharge, blood in urine, nausea, vomiting. Raw, redness, irritation, rash feels like bumps but no blisters to vaginal area.  Protocols used: Urination Pain - Female-A-AH

## 2024-08-11 ENCOUNTER — Other Ambulatory Visit (HOSPITAL_COMMUNITY)
Admission: RE | Admit: 2024-08-11 | Discharge: 2024-08-11 | Disposition: A | Discharge status: Home / Self Care | Source: Ambulatory Visit | Attending: Family Medicine | Admitting: Family Medicine

## 2024-08-11 ENCOUNTER — Ambulatory Visit: Admitting: Family Medicine

## 2024-08-11 ENCOUNTER — Encounter: Payer: Self-pay | Admitting: Family Medicine

## 2024-08-11 ENCOUNTER — Other Ambulatory Visit: Payer: Self-pay | Admitting: Family Medicine

## 2024-08-11 VITALS — BP 118/74 | HR 78 | Temp 97.2°F | Ht 64.0 in | Wt 198.2 lb

## 2024-08-11 DIAGNOSIS — N760 Acute vaginitis: Secondary | ICD-10-CM | POA: Insufficient documentation

## 2024-08-11 MED ORDER — FLUCONAZOLE 150 MG PO TABS: ORAL_TABLET | ORAL | 0 refills | Status: DC

## 2024-08-11 NOTE — Progress Notes (Signed)
 Subjective:     Patient ID: Jenny [name], female    DOB: 1941-06-11, 83 y.o.   MRN: [id number]  Chief Complaint  Patient presents with   Vaginal Itching    Pt states she is having itching and now its in her groin she has used Monistat and also has been going on x2 weeks    Discussed the use of AI scribe software for clinical note transcription with the patient, who gave verbal consent to proceed.  History of Present Illness Jenny [name] is an 83 year old female who presents with vaginal itching and rash.  She has been experiencing severe vaginal itching for the past couple of weeks, which has now extended to her groin and legs, leading to soreness. The itching typically starts near the vaginal area where she urinates. No recent antibiotic use, new soaps, or changes in laundry detergents. She occasionally uses panty liners but avoids them to prevent sweating.  She has a history of similar episodes occurring approximately every month, which she has managed with over-the-counter treatments such as cortisone, Monistat, and tea tree oil, but these have not been effective this time. She recalls a past visit to a doctor for a similar issue, where she was advised to use vitamin E for an itchy patch, but she has not had a follow-up since then. She has not been able to visually inspect the affected area due to back problems.  No noticeable discharge in her clothing, but she acknowledges the presence of a rash. She describes the condition as recurring and has been self-managing it for years.    There are no preventive care reminders to display for this patient.  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   Anemia 09/21/2011   Asthma 01/14/2011   Atrophic vaginitis 06/17/2012   BACK PAIN, LUMBAR 08/23/2007   DEGENERATIVE JOINT DISEASE 02/22/2008   Depression with anxiety 03/06/2008   Qualifier: Diagnosis of  By: Christi  MD, Glendia HERO    DIVERTICULOSIS OF COLON 02/22/2008   GERD (gastroesophageal reflux disease)    HYPERTENSION 02/22/2008   HYPOTHYROIDISM 08/23/2007   Interstitial lung disease (HCC)    OBESITY 08/23/2007   Peripheral neuropathy 02/12/2011   possibly related to spondylolisthesis per pt   Pneumonia due to COVID-19 virus 09/2020   PONV (postoperative nausea and vomiting)    Sleep apnea    VENOUS INSUFFICIENCY 08/23/2007   VITAMIN D  DEFICIENCY 08/21/2008    Past Surgical History:  Procedure Laterality Date   ABDOMINAL HYSTERECTOMY     took one ovary   AP repair and sling for cystocele  8-04    Dr Chales   APPENDECTOMY     carpal tunel  2008   CHOLECYSTECTOMY     ctr     bilateral   EYE SURGERY     left hand  2010   left thumb, joint removal, CTR   NECK SURGERY     x 2 - discectomy, fusion   SHOULDER ARTHROSCOPY DISTAL CLAVICLE EXCISION AND OPEN ROTATOR CUFF REPAIR     right   TONSILLECTOMY AND ADENOIDECTOMY     TUBAL LIGATION       Current Outpatient Medications:    albuterol  (VENTOLIN  HFA) 108 (90 Base) MCG/ACT inhaler, INHALE 1-2 PUFFS INTO THE LUNGS EVERY 6 HOURS AS NEEDED., Disp: 8.5 each, Rfl: 3   ALPRAZolam  (XANAX ) 0.5 MG tablet, TAKE 1/2 TO 1 TABLET BY MOUTH 3  TIMES DAILY AS NEEDED FOR ANXIETY, Disp: 90 tablet, Rfl: 1   amLODipine  (NORVASC ) 5 MG tablet, TAKE 1 TABLET BY MOUTH DAILY, Disp: 100 tablet, Rfl: 2   budesonide -formoterol  (SYMBICORT ) 80-4.5 MCG/ACT inhaler, Inhale 2 puffs into the lungs 2 (two) times daily., Disp: 1 each, Rfl: 11   Dupilumab  (DUPIXENT ) 300 MG/2ML SOAJ, Inject 300 mg into the skin every 14 (fourteen) days., Disp: 12 mL, Rfl: 1   ergocalciferol  (VITAMIN D2) 1.25 MG (50000 UT) capsule, Take 50,000 Units by mouth once a week., Disp: , Rfl:    escitalopram  (LEXAPRO ) 20 MG tablet, Take 1 tablet (20 mg total) by mouth daily., Disp: 90 tablet, Rfl: 3   fluconazole  (DIFLUCAN ) 150 MG tablet, 1 tab orally Every 3 days, Disp: 3 tablet, Rfl: 0    fluticasone  (FLONASE ) 50 MCG/ACT nasal spray, PLACE 1 SPRAY INTO BOTH NOSTRILS DAILY., Disp: 48 mL, Rfl: 1   furosemide  (LASIX ) 20 MG tablet, TAKE 1 TABLET BY MOUTH DAILY (Patient taking differently: Take 20 mg by mouth as needed.), Disp: 100 tablet, Rfl: 2   gabapentin  (NEURONTIN ) 300 MG capsule, TAKE 1 CAPSULE BY MOUTH EVERYDAY AT BEDTIME, Disp: 90 capsule, Rfl: 0   levothyroxine  (SYNTHROID ) 112 MCG tablet, Take 1 tablet (112 mcg total) by mouth daily., Disp: 30 tablet, Rfl: 3   meloxicam  (MOBIC ) 7.5 MG tablet, TAKE 1 TABLET BY MOUTH EVERY DAY AS NEEDED FOR PAIN, Disp: 30 tablet, Rfl: 0   ondansetron  (ZOFRAN ) 4 MG tablet, Take 1 tablet (4 mg total) by mouth every 8 (eight) hours as needed., Disp: 30 tablet, Rfl: 0   pantoprazole  (PROTONIX ) 40 MG tablet, TAKE 1 TABLET BY MOUTH EVERY DAY 30 MINUTES BEFORE DINNER, Disp: 90 tablet, Rfl: 3   Vitamin D , Ergocalciferol , (DRISDOL ) 1.25 MG (50000 UNIT) CAPS capsule, Take 1 capsule (50,000 Units total) by mouth every 7 (seven) days., Disp: 12 capsule, Rfl: 0  Allergies  Allergen Reactions   Prednisone  Other (See Comments)    Confusion, dizziness Medrol  is ok   Other Other (See Comments)    Patient states that she cannot have any spicy foods   Codeine Nausea Only        Tramadol  Itching   ROS neg/noncontributory except as noted HPI/below      Objective:     BP 118/74 (BP Location: Left Arm, Patient Position: Sitting, Cuff Size: Large)   Pulse 78   Temp (!) 97.2 F (36.2 C) (Temporal)   Ht 5' 4 (1.626 m)   Wt 198 lb 4 oz (89.9 kg)   SpO2 90%   BMI 34.03 kg/m  Wt Readings from Last 3 Encounters:  08/11/24 198 lb 4 oz (89.9 kg)  08/09/24 199 lb (90.3 kg)  07/27/24 199 lb 8 oz (90.5 kg)    Physical Exam GENERAL: Well developed, well nourished, no acute distress. HEAD EYES EARS NOSE THROAT: Normocephalic, atraumatic, conjunctiva not injected, sclera nonicteric. EXTREMITIES: No edema. MUSCULOSKELETAL: No gross  abnormalities. NEUROLOGICAL: Alert and oriented x3, cranial nerves II through XII intact. PSYCHIATRIC: Normal mood, good eye contact. GENITOURINARY: Vagina and surrounding areas erythematous with satellite lesions extending past groins onto legs and mons. No d/t,  aptima swab obtained       Assessment & Plan:  Acute vaginitis -     Cervicovaginal ancillary only  Other orders -     Fluconazole ; 1 tab orally Every 3 days  Dispense: 3 tablet; Refill: 0    Assessment and Plan Assessment & Plan Probable Severe vulvovaginal candidiasis and  contact dermatitis with satellite lesions   She presents with severe vulvovaginal candidiasis and contact dermatitis, marked by significant erythema and satellite lesions extending to the legs and mons. These episodes recur monthly, with the current one being more severe. Although herpes is considered due to the recurrent nature, the presentation aligns more with yeast infection and contact dermatitis. Previous self-treatment with cortisone and Monistat was ineffective.  A swab was taken for further evaluation. Administer Diflucan  150 mg and repeat in three days x 2. Send swab for laboratory analysis to confirm diagnosis.     Return in about 1 week (around 08/18/2024), or if symptoms worsen or fail to improve, for w/me-vaginal yeast.  Jenkins CHRISTELLA Carrel, MD

## 2024-08-11 NOTE — Patient Instructions (Signed)
 It was very nice to see you today!  Diflucan  every 3 days    PLEASE NOTE:  If you had any lab tests please let us  know if you have not heard back within a few days. You may see your results on MyChart before we have a chance to review them but we will give you a call once they are reviewed by us . If we ordered any referrals today, please let us  know if you have not heard from their office within the next week.   Please try these tips to maintain a healthy lifestyle:  Eat most of your calories during the day when you are active. Eliminate processed foods including packaged sweets (pies, cakes, cookies), reduce intake of potatoes, white bread, white pasta, and white rice. Look for whole grain options, oat flour or almond flour.  Each meal should contain half fruits/vegetables, one quarter protein, and one quarter carbs (no bigger than a computer mouse).  Cut down on sweet beverages. This includes juice, soda, and sweet tea. Also watch fruit intake, though this is a healthier sweet option, it still contains natural sugar! Limit to 3 servings daily.  Drink at least 1 glass of water with each meal and aim for at least 8 glasses per day  Exercise at least 150 minutes every week.

## 2024-08-14 ENCOUNTER — Ambulatory Visit (INDEPENDENT_AMBULATORY_CARE_PROVIDER_SITE_OTHER): Admitting: Family Medicine

## 2024-08-14 ENCOUNTER — Encounter: Payer: Self-pay | Admitting: Radiology

## 2024-08-14 DIAGNOSIS — E538 Deficiency of other specified B group vitamins: Secondary | ICD-10-CM

## 2024-08-14 NOTE — Progress Notes (Signed)
 Jenny [name] is a 83 y.o. female presents in office today for a nurse visit for B12 Injection.   Patient Injection was given in the  Left deltoid. Patient tolerated injection well.   Patient's next injection due N/A, appt made? no  [name]

## 2024-08-14 NOTE — Progress Notes (Signed)
   Subjective:    Patient ID: Jenny [name], female    DOB: Mar 23, 1941, 83 y.o.   MRN: [id number]  HPI    Review of Systems     Objective:   Physical Exam        Assessment & Plan:

## 2024-08-15 ENCOUNTER — Encounter

## 2024-08-15 DIAGNOSIS — G4733 Obstructive sleep apnea (adult) (pediatric): Secondary | ICD-10-CM

## 2024-08-16 ENCOUNTER — Ambulatory Visit: Payer: Self-pay | Admitting: Family Medicine

## 2024-08-16 ENCOUNTER — Encounter: Payer: Self-pay | Admitting: Family Medicine

## 2024-08-16 ENCOUNTER — Other Ambulatory Visit: Payer: Self-pay | Admitting: Family Medicine

## 2024-08-16 LAB — MOLECULAR ANCILLARY ONLY
Bacterial Vaginitis (gardnerella): POSITIVE — AB
Candida Glabrata: NEGATIVE
Candida Vaginitis: NEGATIVE
Comment: NEGATIVE
Comment: NEGATIVE
Comment: NEGATIVE
Comment: NEGATIVE
Trichomonas: NEGATIVE

## 2024-08-16 MED ORDER — CLOTRIMAZOLE-BETAMETHASONE 1-0.05 % EX CREA: 1.0000 | TOPICAL_CREAM | Freq: Every day | CUTANEOUS | 0 refills | Status: AC

## 2024-08-16 MED ORDER — METRONIDAZOLE 500 MG PO TABS: 500.0000 mg | ORAL_TABLET | Freq: Two times a day (BID) | ORAL | 0 refills | Status: DC

## 2024-08-16 NOTE — Progress Notes (Signed)
 Called pt and notified, she said she will call and change her appt tomorrow smk

## 2024-08-16 NOTE — Progress Notes (Signed)
 She has BV -I sent in meds.  Make sure not drinking alcohol while on it

## 2024-08-16 NOTE — Telephone Encounter (Signed)
**Note De-identified  [place] Obfuscation** Please advise 

## 2024-08-17 ENCOUNTER — Telehealth: Payer: Self-pay | Admitting: Family Medicine

## 2024-08-17 NOTE — Telephone Encounter (Signed)
**Note De-identified  [place] Obfuscation** Please advise 

## 2024-08-17 NOTE — Telephone Encounter (Signed)
 Patient came in on 11/5 to drop off a copy of her healthcare power of attorney. This form has been indexed in chart under media. Patient was also inquiring about signing a DNR. Upon discussion with Jacquline, this is usually done during an office visit where provider can advise what a DNR consists of. However, patient informed that she had already somewhat discussed this topic with PCP. PCP's schedule is full for the next several weeks with the exception of [place] appt slots. Patient stated that she saw PCP earlier in October. How would you like to proceed with this?

## 2024-08-18 ENCOUNTER — Ambulatory Visit: Admitting: Family Medicine

## 2024-08-24 ENCOUNTER — Other Ambulatory Visit

## 2024-08-24 ENCOUNTER — Ambulatory Visit: Payer: Self-pay | Admitting: Family Medicine

## 2024-08-24 ENCOUNTER — Other Ambulatory Visit (INDEPENDENT_AMBULATORY_CARE_PROVIDER_SITE_OTHER)

## 2024-08-24 DIAGNOSIS — E039 Hypothyroidism, unspecified: Secondary | ICD-10-CM | POA: Diagnosis not present

## 2024-08-24 LAB — TSH: TSH: 1.35 u[IU]/mL (ref 0.35–5.50)

## 2024-08-24 NOTE — Progress Notes (Signed)
 Lab letter sent

## 2024-08-28 DIAGNOSIS — G4733 Obstructive sleep apnea (adult) (pediatric): Secondary | ICD-10-CM | POA: Diagnosis not present

## 2024-08-30 ENCOUNTER — Ambulatory Visit (HOSPITAL_BASED_OUTPATIENT_CLINIC_OR_DEPARTMENT_OTHER): Payer: Self-pay | Admitting: Pulmonary Disease

## 2024-08-30 DIAGNOSIS — G4733 Obstructive sleep apnea (adult) (pediatric): Secondary | ICD-10-CM

## 2024-09-01 ENCOUNTER — Telehealth: Payer: Self-pay

## 2024-09-01 NOTE — Telephone Encounter (Signed)
 Copied from CRM #[id number]. Topic: Clinical - Order For Equipment >> Aug 31, 2024 11:26 AM Ismael A wrote: Reason for CRM: patient states she never got a new CPAP, so she would like to get one as she did a home sleep test and she now requires a CPAP  DUPLICATE MESSAGE

## 2024-09-01 NOTE — Telephone Encounter (Signed)
 Copied from CRM #[id number]. Topic: Clinical - Order For Equipment >> Aug 31, 2024 11:26 AM Jenny [name] wrote: Reason for CRM: patient states she never got [name] new CPAP, so she would like to get one as she did [name] home sleep test and she now requires [name] CPAP   Returned patient's call.  Patient did NOT get [name] CPAP back in September 2024.  Patient would like [name] new CPAP order sent in.  No preference for the DME.  Per Dr. Jude, send in new CPAP order:  Please send Rx for autovCPAP 5-15 cm OV with APP/ me in 6 weeks   Sent in order for new CPAP.  Patient informed.  Scheduled patient for follow up 10/26/2024 at 9:45 am with Dr. Jude at [facility] office.

## 2024-09-04 NOTE — Addendum Note (Signed)
 Addended by: TRUDY WARREN CROME on: 09/04/2024 01:34 PM   Modules accepted: Orders

## 2024-09-04 NOTE — Progress Notes (Signed)
Order placed for new CPAP

## 2024-09-05 ENCOUNTER — Encounter: Payer: Self-pay | Admitting: Family Medicine

## 2024-09-11 ENCOUNTER — Encounter: Payer: Self-pay | Admitting: Family Medicine

## 2024-09-11 NOTE — Telephone Encounter (Signed)
 Please advise on if patient should come in or what treatment plan patient should complete to help with her symptoms. Please and thank you

## 2024-09-12 ENCOUNTER — Other Ambulatory Visit: Payer: Self-pay | Admitting: Family Medicine

## 2024-09-12 ENCOUNTER — Other Ambulatory Visit: Payer: Self-pay | Admitting: Orthopedic Surgery

## 2024-09-12 ENCOUNTER — Ambulatory Visit

## 2024-09-12 DIAGNOSIS — E538 Deficiency of other specified B group vitamins: Secondary | ICD-10-CM | POA: Diagnosis not present

## 2024-09-12 MED ORDER — CYANOCOBALAMIN 1000 MCG/ML IJ SOLN
1000.0000 ug | Freq: Once | INTRAMUSCULAR | Status: AC
  Administered 2024-09-12: 1000 ug via INTRAMUSCULAR

## 2024-09-12 NOTE — Telephone Encounter (Signed)
 Patient came in for B12 injection this morning - provided DNR filled out by PCP, gave patient the original and sent the copy to Batch scan

## 2024-09-12 NOTE — Progress Notes (Signed)
 Patient is in office today for a nurse visit for B12 Injection. Patient Injection was given in the  Left deltoid. Patient tolerated injection well.

## 2024-09-13 NOTE — Telephone Encounter (Signed)
 Can we please have patient schedule an appointment to see a provider. Please and thank you.

## 2024-09-13 NOTE — Telephone Encounter (Signed)
 LVM to schedule ov with primary or any available provider.

## 2024-09-13 NOTE — Telephone Encounter (Signed)
 Requested Prescriptions   Pending Prescriptions Disp Refills   ALPRAZolam  (XANAX ) 0.5 MG tablet [Pharmacy Med Name: ALPRAZOLAM  0.5 MG TABLET] 90 tablet 1    Sig: TAKE 1/2 TO 1 TABLET BY MOUTH 3 TIMES DAILY AS NEEDED FOR ANXIETY     Date of patient request: 09/13/24 Last office visit: 08/14/2024 Upcoming visit: Visit date not found Date of last refill: 06/23/24 Last refill amount: 90 w/ 1 refill

## 2024-09-20 ENCOUNTER — Other Ambulatory Visit: Payer: Self-pay

## 2024-09-21 ENCOUNTER — Other Ambulatory Visit (HOSPITAL_COMMUNITY): Payer: Self-pay

## 2024-09-21 ENCOUNTER — Other Ambulatory Visit: Payer: Self-pay

## 2024-09-21 NOTE — Progress Notes (Signed)
 Specialty Pharmacy Refill Coordination Note  MyChart Questionnaire Submission  Jenny [name] is a 83 y.o. female contacted today regarding refills of specialty medication(s) Dupixent .  Doses on hand: 1 for 12/14 Next Injection Date: 10/08/24  Patient requested: (Patient-Rptd) Delivery   Delivery date: 10/03/24  Verified address: [address] [place] [address]  Medication will be filled on 10/02/24

## 2024-09-26 ENCOUNTER — Encounter: Payer: Self-pay | Admitting: Orthopedic Surgery

## 2024-09-28 ENCOUNTER — Other Ambulatory Visit: Payer: Self-pay

## 2024-09-28 ENCOUNTER — Ambulatory Visit: Admitting: Podiatry

## 2024-09-28 ENCOUNTER — Encounter: Payer: Self-pay | Admitting: Podiatry

## 2024-09-28 DIAGNOSIS — G629 Polyneuropathy, unspecified: Secondary | ICD-10-CM | POA: Diagnosis not present

## 2024-09-28 DIAGNOSIS — M79675 Pain in left toe(s): Secondary | ICD-10-CM | POA: Diagnosis not present

## 2024-09-28 DIAGNOSIS — Q828 Other specified congenital malformations of skin: Secondary | ICD-10-CM

## 2024-09-28 DIAGNOSIS — M79674 Pain in right toe(s): Secondary | ICD-10-CM

## 2024-09-28 DIAGNOSIS — B351 Tinea unguium: Secondary | ICD-10-CM

## 2024-09-28 NOTE — Progress Notes (Unsigned)
 Subjective: Chief Complaint  Patient presents with   RFC    Rm12 Routine foot care     83 year old female presents the office today for concerns of calluses to her left foot that is causing pain, mostly left foot submetatarsal 5.  Nails are also thickened elongated she cannot trim herself and they cause pain.  No open lesions or injuries to her feet.   Objective: AAO x3, NAD DP/PT pulses palpable bilaterally, CRT less than 3 seconds Hyperkeratotic lesions left foot submetatarsal 1, 5 without any underlying ulceration drainage or signs of infection.   Prominent metatarsal heads as well as along the fifth MTPJ on the left side where she has majority discomfort. Nails are mildly hypertrophic, dystrophic and elongated causing discomfort x 10.  No edema, erythema or signs of infection. No pain with calf compression, swelling, warmth, erythema  Assessment: Hyperkeratotic lesions left foot; neuropathy; symptomatic onychomycosis  Plan: -All treatment options discussed with the patient including all alternatives, risks, complications.  -Sharply debrided nails x 10 without any complications or bleeding - Sharply debrided hyperkeratotic lesion x 2 without any complications or bleeding. - Discussed moisturizer, offloading with calluses.  Return in about 3 months (around 09/14/2024).  Jenny [name] DPM

## 2024-09-28 NOTE — Progress Notes (Signed)
 Specialty Pharmacy Ongoing Clinical Assessment Note  Jenny [name] is a 83 y.o. female who is being followed by the specialty pharmacy service for RxSp Asthma/COPD   Patient's specialty medication(s) reviewed today: Dupilumab  (Dupixent )   Missed doses in the last 4 weeks: 0   Patient/Caregiver did not have any additional questions or concerns.   Therapeutic benefit summary: Patient is achieving benefit   Adverse events/side effects summary: No adverse events/side effects   Patient's therapy is appropriate to: Continue    Goals Addressed             This Visit's Progress    Maintain optimal adherence to therapy   On track    Patient is on track. Patient will maintain adherence and be monitored by provider to determine if a change in treatment plan is warranted         Follow up: 12 months  Silvano LOISE Dolly Specialty Pharmacist

## 2024-10-02 ENCOUNTER — Encounter: Payer: Self-pay | Admitting: Family Medicine

## 2024-10-02 NOTE — Telephone Encounter (Signed)
 01/16 with Addie taken so scheduled for [facility] on 01/16 @915am  patient aware

## 2024-10-03 NOTE — Telephone Encounter (Signed)
 Scheduled patient for Friday at 9:40am with you

## 2024-10-03 NOTE — Telephone Encounter (Signed)
 Patient is requesting abx for itching

## 2024-10-06 ENCOUNTER — Encounter: Payer: Self-pay | Admitting: Family Medicine

## 2024-10-06 ENCOUNTER — Ambulatory Visit (INDEPENDENT_AMBULATORY_CARE_PROVIDER_SITE_OTHER): Admitting: Family Medicine

## 2024-10-06 VITALS — BP 108/66 | HR 82 | Temp 98.1°F | Resp 18 | Ht 64.0 in | Wt 201.6 lb

## 2024-10-06 DIAGNOSIS — N898 Other specified noninflammatory disorders of vagina: Secondary | ICD-10-CM

## 2024-10-06 NOTE — Progress Notes (Signed)
" ° °  Subjective:    Patient ID: Jenny [name], female    DOB: 19-Sep-1941, 83 y.o.   MRN: [id number]  HPI Vaginal itching- pt was tx'd w/ both diflucan  and metronidazole  after testing + for BV.  Has had multiple rounds of medication.  Also given Lotrisone .  Sxs are calm today but worsen when she urinates.  Pt reports both internal and external itching.  No discharge.     Review of Systems For ROS see HPI     Objective:   Physical Exam Vitals reviewed.  Constitutional:      General: She is not in acute distress.    Appearance: Normal appearance. She is obese. She is not ill-appearing.  HENT:     Head: Normocephalic and atraumatic.  Genitourinary:    Comments: Pt declined Skin:    General: Skin is warm and dry.  Neurological:     General: No focal deficit present.     Mental Status: She is alert and oriented to person, place, and time.  Psychiatric:        Mood and Affect: Mood normal.        Behavior: Behavior normal.        Thought Content: Thought content normal.           Assessment & Plan:  Vaginal itching- new.  Reviewed previous notes and lab results.  She has been treated w/ multiple rounds of medication but continues to have sxs.  Discussed that not all itching is yeast or BV and that we may be dealing w/ vaginal atrophy.  Pt performed self swab and will test for vaginitis.  If negative, will start topical vaginal estrogen.  Pt expressed understanding and is in agreement w/ plan.   "

## 2024-10-06 NOTE — Patient Instructions (Signed)
 Follow up as needed or as scheduled Once we have your lab results we'll decide how to proceed- either treating yeast or BV or starting estrogen cream for vaginal dryness Call with any questions or concerns Stay Safe!  Stay Healthy! Happy New Year!

## 2024-10-07 ENCOUNTER — Other Ambulatory Visit: Payer: Self-pay | Admitting: Family Medicine

## 2024-10-07 LAB — SURESWAB® ADVANCED VAGINITIS PLUS,TMA
C. trachomatis RNA, TMA: NOT DETECTED
CANDIDA SPECIES: NOT DETECTED
Candida glabrata: NOT DETECTED
N. gonorrhoeae RNA, TMA: NOT DETECTED
SURESWAB(R) ADV BACTERIAL VAGINOSIS(BV),TMA: NEGATIVE
TRICHOMONAS VAGINALIS (TV),TMA: NOT DETECTED

## 2024-10-10 ENCOUNTER — Ambulatory Visit: Payer: Self-pay | Admitting: Family Medicine

## 2024-10-10 MED ORDER — ESTRADIOL 0.01 % VA CREA: TOPICAL_CREAM | VAGINAL | 3 refills | Status: AC

## 2024-10-10 NOTE — Telephone Encounter (Signed)
Prescription for vaginal estrogen sent to pharmacy.

## 2024-10-16 ENCOUNTER — Encounter: Payer: Self-pay | Admitting: Podiatry

## 2024-10-24 ENCOUNTER — Other Ambulatory Visit: Payer: Self-pay | Admitting: Family Medicine

## 2024-10-24 DIAGNOSIS — E039 Hypothyroidism, unspecified: Secondary | ICD-10-CM

## 2024-10-26 ENCOUNTER — Telehealth (HOSPITAL_BASED_OUTPATIENT_CLINIC_OR_DEPARTMENT_OTHER): Payer: Self-pay

## 2024-10-26 ENCOUNTER — Ambulatory Visit (HOSPITAL_BASED_OUTPATIENT_CLINIC_OR_DEPARTMENT_OTHER): Admitting: Pulmonary Disease

## 2024-10-26 DIAGNOSIS — J849 Interstitial pulmonary disease, unspecified: Secondary | ICD-10-CM

## 2024-10-26 NOTE — Telephone Encounter (Signed)
 CMN received for CPAP supplies sent to [facility] signed by provider and faxed confirmation received

## 2024-10-27 ENCOUNTER — Ambulatory Visit: Admitting: Surgical

## 2024-10-27 ENCOUNTER — Encounter: Payer: Self-pay | Admitting: Surgical

## 2024-10-27 ENCOUNTER — Other Ambulatory Visit: Payer: Self-pay

## 2024-10-27 DIAGNOSIS — M19012 Primary osteoarthritis, left shoulder: Secondary | ICD-10-CM | POA: Diagnosis not present

## 2024-10-27 DIAGNOSIS — M1712 Unilateral primary osteoarthritis, left knee: Secondary | ICD-10-CM | POA: Diagnosis not present

## 2024-10-27 MED ORDER — BUPIVACAINE HCL 0.25 % IJ SOLN
4.0000 mL | INTRAMUSCULAR | Status: AC | PRN
  Administered 2024-10-27: 4 mL via INTRA_ARTICULAR

## 2024-10-27 MED ORDER — BUPIVACAINE HCL 0.25 % IJ SOLN
9.0000 mL | INTRAMUSCULAR | Status: AC | PRN
  Administered 2024-10-27: 9 mL via INTRA_ARTICULAR

## 2024-10-27 MED ORDER — LIDOCAINE HCL 1 % IJ SOLN
5.0000 mL | INTRAMUSCULAR | Status: AC | PRN
  Administered 2024-10-27: 5 mL

## 2024-10-27 MED ORDER — METHYLPREDNISOLONE ACETATE 40 MG/ML IJ SUSP
40.0000 mg | INTRAMUSCULAR | Status: AC | PRN
  Administered 2024-10-27: 40 mg via INTRA_ARTICULAR

## 2024-10-27 NOTE — Addendum Note (Signed)
 Addended by: JACKSON LEADER on: 10/27/2024 10:36 AM   Modules accepted: Orders

## 2024-10-27 NOTE — Progress Notes (Signed)
 "  Office Visit Note   Patient: Jenny [name]           Date of Birth: 1940-10-21           MRN: [id number] Visit Date: 10/27/2024 Requested by: Mahlon Comer BRAVO, MD [id number] [place],  [place] [address] PCP: Mahlon Comer BRAVO, MD  Subjective: Chief Complaint  Patient presents with   Left Knee - Pain   Left Shoulder - Pain    HPI: Jenny [name] is a 84 y.o. female who presents to the office reporting knee pain.  Has history of knee arthritis.  No new falls or injuries.  No fevers or chills.  Previous injections have provided good relief and they are here today to repeat injections.  Continues to complain of left knee pain without any new injury.  She also describes left shoulder pain.  Would like to repeat glenohumeral injection that she has had in the past.  She has been taking ibuprofen and Tylenol  with some relief but she is try to limit the oral medicine due to stomach discomfort more and more..                ROS: All systems reviewed are negative as they relate to the chief complaint within the history of present illness.  Patient denies fevers or chills.  Assessment & Plan: Visit Diagnoses:  1. Arthritis of left shoulder region     Plan: Plan today is left knee injection and left glenohumeral injection.  Tolerated both injections well.  We will preapproved her for left knee gel injection at the next possible interval.  Last gel injection was on 06/19/2024.  This patient is diagnosed with osteoarthritis of the knee(s).    Radiographs show evidence of joint space narrowing, osteophytes, subchondral sclerosis and/or subchondral cysts.  This patient has knee pain which interferes with functional and activities of daily living.    This patient has experienced inadequate response, adverse effects and/or intolerance with conservative treatments such as acetaminophen , NSAIDS, topical creams, physical therapy or regular exercise, knee bracing and/or weight loss.   This  patient has experienced inadequate response or has a contraindication to intra articular steroid injections for at least 3 months.   This patient is not scheduled to have a total knee replacement within 6 months of starting treatment with viscosupplementation.   Follow-Up Instructions: No follow-ups on file.   Orders:  Orders Placed This Encounter  Procedures   US  Guided Needle Placement - No Linked Charges   No orders of the defined types were placed in this encounter.     Procedures: Large Joint Inj: L knee on 10/27/2024 9:24 AM Indications: diagnostic evaluation, joint swelling and pain Details: 18 G 1.5 in needle, superolateral approach  Arthrogram: No  Medications: 5 mL lidocaine  1 %; 4 mL bupivacaine  0.25 %; 40 mg methylPREDNISolone  acetate 40 MG/ML Outcome: tolerated well, no immediate complications Procedure, treatment alternatives, risks and benefits explained, specific risks discussed. Consent was given by the patient. Immediately prior to procedure a time out was called to verify the correct patient, procedure, equipment, support staff and site/side marked as required. Patient was prepped and draped in the usual sterile fashion.    Large Joint Inj: L glenohumeral on 10/27/2024 9:24 AM Indications: pain and diagnostic evaluation Details: 22 G 3.5 in needle, ultrasound-guided posterior approach Medications: 9 mL bupivacaine  0.25 %; 40 mg methylPREDNISolone  acetate 40 MG/ML Outcome: tolerated well, no immediate complications Procedure, treatment alternatives, risks and  benefits explained, specific risks discussed. Consent was given by the patient. Immediately prior to procedure a time out was called to verify the correct patient, procedure, equipment, support staff and site/side marked as required. Patient was prepped and draped in the usual sterile fashion.       Clinical Data: No additional findings.  Objective: Vital Signs: There were no vitals taken for this  visit.  Physical Exam:  Constitutional: Patient appears well-developed HEENT:  Head: Normocephalic Eyes:EOM are normal Neck: Normal range of motion Cardiovascular: Normal rate Pulmonary/chest: Effort normal Neurologic: Patient is alert Skin: Skin is warm Psychiatric: Patient has normal mood and affect  Ortho Exam: Ortho exam demonstrates knees without cellulitis or skin changes.  Effusion not present in the left knee.  No calf tenderness.  Negative Homans' sign.  No pain with hip range of motion.  Able to perform straight leg raise with both lower extremities.  Lower extremities warm and well-perfused.  Tender over the medial and lateral joint lines  No cellulitis or skin changes noted over the left shoulder region.  axillary nerve intact with deltoid firing .  She has 2+ radial pulse of the left upper extremity.  Specialty Comments:  EXAM DESCRIPTION: MR LUMBAR SPINE WO CONTRAST   CLINICAL HISTORY: Low back pain, symptoms persist with > 6 wks treatment; spinal stenosis, left leg radiculopathy   COMPARISON: None Available.   TECHNIQUE: MRI of the lumbar spine is performed according to our usual protocol with axial and sagittal multi sequence imaging.   FINDINGS: The alignment is unremarkable. Laminectomy and posterior fusion of L4-S1. Prosthetic disc spacers. 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 No significant endplate marrow edema. The marrow signal is unremarkable as well as the conus and cauda equina.   L2-3 has moderate central stenosis and moderate bilateral foraminal narrowing due to broad-based disc bulge and facet/ligament flavum hypertrophy.   L3-4 has moderate to severe bilateral foraminal narrowing and severe central stenosis due to broad-based disc bulge and facet/ligament flavum hypertrophy.   L4-5 has mild foraminal narrowing the left due to broad-based disc bulge and facet hypertrophy.   L5-S1 has mild bilateral foraminal and  lateral recess narrowing due to broad-based disc bulge and facet/ligament flavum hypertrophy.   There is a relatively simple appearing fluid collection measuring up to 2.8 cm posterior to the thecal sac at the L4-5 level. This is likely postoperative seroma.   Benign-appearing renal cysts. The imaged retroperitoneal structures are otherwise unremarkable.     IMPRESSION: Multilevel degenerative spondylosis with levels described in detail above.   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   L2-3 has a moderate degenerative central stenosis.   Simple appearing fluid collection posterior to the thecal sac at the L4-5 level measuring up to 2.8 cm. This is likely chronic postoperative seroma. Aspiration if clinically warranted.     Electronically signed by: Reyes Frees MD 04/06/2024 11:58  Imaging: No results found.   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   Vitamin D  deficiency 02/12/2011   Peripheral neuropathy 02/12/2011   Intertrigo 01/21/2011   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   History of chicken pox 01/14/2011   History of measles 01/14/2011   OTHER URINARY INCONTINENCE 03/12/2010   Essential hypertension 02/22/2008   DIVERTICULOSIS OF COLON 02/22/2008   Unilateral primary  osteoarthritis, left knee 02/22/2008   Hypothyroidism 08/23/2007   Morbid obesity (HCC) 08/23/2007   OSA on CPAP 08/23/2007   Venous (peripheral) insufficiency 08/23/2007   Allergic rhinitis 08/23/2007   GERD without esophagitis 08/23/2007   BACK PAIN, LUMBAR 08/23/2007   Past Medical History:  Diagnosis Date   ALLERGIC RHINITIS 08/23/2007   Allergy    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   Anemia 09/21/2011   Asthma 01/14/2011   Atrophic vaginitis 06/17/2012   BACK PAIN, LUMBAR 08/23/2007   COPD (chronic obstructive pulmonary disease) (HCC)    DEGENERATIVE JOINT DISEASE 02/22/2008   Depression with anxiety 03/06/2008   Qualifier: Diagnosis of  By: Christi MD, Glendia HERO    DIVERTICULOSIS OF COLON 02/22/2008   GERD (gastroesophageal reflux disease)    HYPERTENSION 02/22/2008   HYPOTHYROIDISM 08/23/2007   Interstitial lung disease (HCC)    OBESITY 08/23/2007   Peripheral neuropathy 02/12/2011   possibly related to spondylolisthesis per pt   Pneumonia due to COVID-19 virus 09/2020   PONV (postoperative nausea and vomiting)    Sleep apnea    VENOUS INSUFFICIENCY 08/23/2007   VITAMIN D  DEFICIENCY 08/21/2008    Family History  Problem Relation Age of Onset   COPD Mother    Pneumonia Mother    Other Mother        CHF   Hypertension Mother    Arthritis Mother        septic knee after replacement   Heart disease Mother        chf   Dementia Father    Arthritis Brother    Asthma Daughter    Colon polyps Daughter    Stroke Maternal Grandmother    Heart disease Maternal Grandmother    Other Maternal Grandfather        CHF   Heart disease Maternal Grandfather        chf   Cancer Paternal Grandmother        stomach?   Other Paternal Grandfather        problems with kidneys   Kidney disease Paternal Grandfather    Anxiety disorder Daughter    Cancer Maternal Aunt        breast   Cancer Maternal Aunt        liver   Colon cancer Neg Hx     Past Surgical History:   Procedure Laterality Date   ABDOMINAL HYSTERECTOMY     took one ovary   AP repair and sling for cystocele  05/2003   Dr Chales   APPENDECTOMY     carpal tunel  2008   CHOLECYSTECTOMY     ctr     bilateral   EYE SURGERY     left hand  2010   left thumb, joint removal, CTR   NECK SURGERY     x 2 - discectomy, fusion   SHOULDER ARTHROSCOPY DISTAL CLAVICLE EXCISION AND OPEN ROTATOR CUFF REPAIR     right   SPINE SURGERY     TONSILLECTOMY AND ADENOIDECTOMY     TUBAL  LIGATION     Social History   Occupational History   Occupation: Retired  Tobacco Use   Smoking status: Former    Current packs/day: 0.00    Average packs/day: 1.5 packs/day for 30.0 years (45.0 ttl pk-yrs)    Types: Cigarettes    Start date: 10/12/1961    Quit date: 10/13/1991    Years since quitting: 33.0   Smokeless tobacco: Never  Vaping Use   Vaping status: Never Used  Substance and Sexual Activity   Alcohol use: No    Alcohol/week: 0.0 standard drinks of alcohol   Drug use: No   Sexual activity: Never         "

## 2024-11-03 ENCOUNTER — Encounter: Payer: Self-pay | Admitting: Family Medicine

## 2024-11-07 ENCOUNTER — Other Ambulatory Visit: Payer: Self-pay

## 2024-11-07 MED ORDER — AMLODIPINE BESYLATE 5 MG PO TABS: 5.0000 mg | ORAL_TABLET | Freq: Every day | ORAL | 2 refills | Status: AC

## 2024-11-10 NOTE — Addendum Note (Signed)
 Addended by: Vladislav Axelson L on: 11/10/2024 01:21 PM   Modules accepted: Orders

## 2024-11-15 ENCOUNTER — Ambulatory Visit (HOSPITAL_BASED_OUTPATIENT_CLINIC_OR_DEPARTMENT_OTHER)
Admission: RE | Admit: 2024-11-15 | Discharge: 2024-11-15 | Disposition: A | Source: Ambulatory Visit | Attending: Pulmonary Disease | Admitting: Pulmonary Disease

## 2024-11-15 DIAGNOSIS — J849 Interstitial pulmonary disease, unspecified: Secondary | ICD-10-CM

## 2024-11-20 ENCOUNTER — Ambulatory Visit (HOSPITAL_BASED_OUTPATIENT_CLINIC_OR_DEPARTMENT_OTHER): Admitting: Pulmonary Disease
